# Patient Record
Sex: Male | Born: 1965 | Race: White | Hispanic: No | Marital: Single | Smoking: Never smoker
Health system: Southern US, Community
[De-identification: ages and names within clinical notes are randomized; demographics above are authoritative.]

## PROBLEM LIST (undated history)

## (undated) DIAGNOSIS — R110 Nausea: Secondary | ICD-10-CM

## (undated) DIAGNOSIS — M793 Panniculitis, unspecified: Secondary | ICD-10-CM

## (undated) DIAGNOSIS — M1990 Unspecified osteoarthritis, unspecified site: Secondary | ICD-10-CM

## (undated) DIAGNOSIS — N529 Male erectile dysfunction, unspecified: Secondary | ICD-10-CM

## (undated) DIAGNOSIS — G510 Bell's palsy: Secondary | ICD-10-CM

## (undated) DIAGNOSIS — R6883 Chills (without fever): Secondary | ICD-10-CM

## (undated) DIAGNOSIS — R519 Headache, unspecified: Secondary | ICD-10-CM

## (undated) DIAGNOSIS — N200 Calculus of kidney: Secondary | ICD-10-CM

## (undated) DIAGNOSIS — K219 Gastro-esophageal reflux disease without esophagitis: Secondary | ICD-10-CM

## (undated) DIAGNOSIS — R0789 Other chest pain: Secondary | ICD-10-CM

## (undated) DIAGNOSIS — G4700 Insomnia, unspecified: Secondary | ICD-10-CM

## (undated) DIAGNOSIS — G20A1 Parkinson's disease without dyskinesia, without mention of fluctuations: Secondary | ICD-10-CM

## (undated) DIAGNOSIS — F4310 Post-traumatic stress disorder, unspecified: Secondary | ICD-10-CM

## (undated) DIAGNOSIS — R748 Abnormal levels of other serum enzymes: Secondary | ICD-10-CM

## (undated) DIAGNOSIS — E119 Type 2 diabetes mellitus without complications: Secondary | ICD-10-CM

## (undated) DIAGNOSIS — R531 Weakness: Secondary | ICD-10-CM

## (undated) DIAGNOSIS — R42 Dizziness and giddiness: Secondary | ICD-10-CM

## (undated) DIAGNOSIS — I10 Essential (primary) hypertension: Secondary | ICD-10-CM

## (undated) DIAGNOSIS — E785 Hyperlipidemia, unspecified: Secondary | ICD-10-CM

## (undated) DIAGNOSIS — E781 Pure hyperglyceridemia: Secondary | ICD-10-CM

## (undated) DIAGNOSIS — F329 Major depressive disorder, single episode, unspecified: Secondary | ICD-10-CM

## (undated) DIAGNOSIS — F419 Anxiety disorder, unspecified: Secondary | ICD-10-CM

## (undated) DIAGNOSIS — R630 Anorexia: Secondary | ICD-10-CM

## (undated) DIAGNOSIS — R51 Headache: Secondary | ICD-10-CM

## (undated) HISTORY — DX: Major depressive disorder, single episode, unspecified: F32.9

## (undated) HISTORY — DX: Panniculitis, unspecified: M79.3

## (undated) HISTORY — DX: Other chest pain: R07.89

## (undated) HISTORY — DX: Dizziness and giddiness: R42

## (undated) HISTORY — DX: Chills (without fever): R68.83

## (undated) HISTORY — DX: Morbid (severe) obesity due to excess calories: E66.01

## (undated) HISTORY — DX: Male erectile dysfunction, unspecified: N52.9

## (undated) HISTORY — DX: Hyperlipidemia, unspecified: E78.5

## (undated) HISTORY — DX: Headache, unspecified: R51.9

## (undated) HISTORY — DX: Abnormal levels of other serum enzymes: R74.8

## (undated) HISTORY — DX: Anorexia: R63.0

## (undated) HISTORY — DX: Bell's palsy: G51.0

## (undated) HISTORY — DX: Unspecified osteoarthritis, unspecified site: M19.90

## (undated) HISTORY — DX: Nausea: R11.0

## (undated) HISTORY — DX: Calculus of kidney: N20.0

## (undated) HISTORY — DX: Pure hyperglyceridemia: E78.1

## (undated) HISTORY — DX: Insomnia, unspecified: G47.00

## (undated) HISTORY — PX: NO PAST SURGERIES: SHX2092

## (undated) HISTORY — DX: Weakness: R53.1

## (undated) HISTORY — DX: Gastro-esophageal reflux disease without esophagitis: K21.9

## (undated) HISTORY — DX: Headache: R51

---

## 1998-09-25 ENCOUNTER — Encounter: Payer: Self-pay | Admitting: Emergency Medicine

## 1998-09-25 ENCOUNTER — Inpatient Hospital Stay (HOSPITAL_COMMUNITY): Admission: EM | Admit: 1998-09-25 | Discharge: 1998-09-26 | Payer: Self-pay | Admitting: Emergency Medicine

## 1998-09-26 ENCOUNTER — Encounter: Payer: Self-pay | Admitting: Cardiovascular Disease

## 1999-03-26 ENCOUNTER — Emergency Department (HOSPITAL_COMMUNITY): Admission: EM | Admit: 1999-03-26 | Discharge: 1999-03-26 | Payer: Self-pay | Admitting: Emergency Medicine

## 1999-03-26 ENCOUNTER — Encounter: Payer: Self-pay | Admitting: Emergency Medicine

## 1999-04-10 ENCOUNTER — Ambulatory Visit (HOSPITAL_COMMUNITY): Admission: RE | Admit: 1999-04-10 | Discharge: 1999-04-10 | Payer: Self-pay | Admitting: Internal Medicine

## 1999-04-10 ENCOUNTER — Encounter (INDEPENDENT_AMBULATORY_CARE_PROVIDER_SITE_OTHER): Payer: Self-pay | Admitting: *Deleted

## 1999-04-10 ENCOUNTER — Encounter: Payer: Self-pay | Admitting: Internal Medicine

## 2000-02-26 ENCOUNTER — Emergency Department (HOSPITAL_COMMUNITY): Admission: EM | Admit: 2000-02-26 | Discharge: 2000-02-26 | Payer: Self-pay | Admitting: *Deleted

## 2001-06-06 ENCOUNTER — Encounter: Payer: Self-pay | Admitting: Emergency Medicine

## 2001-06-06 ENCOUNTER — Emergency Department (HOSPITAL_COMMUNITY): Admission: EM | Admit: 2001-06-06 | Discharge: 2001-06-06 | Payer: Self-pay | Admitting: Emergency Medicine

## 2001-06-07 ENCOUNTER — Emergency Department (HOSPITAL_COMMUNITY): Admission: EM | Admit: 2001-06-07 | Discharge: 2001-06-07 | Payer: Self-pay | Admitting: Emergency Medicine

## 2001-06-07 ENCOUNTER — Encounter: Payer: Self-pay | Admitting: Emergency Medicine

## 2002-01-22 ENCOUNTER — Encounter: Admission: RE | Admit: 2002-01-22 | Discharge: 2002-01-22 | Payer: Self-pay | Admitting: Internal Medicine

## 2002-01-22 ENCOUNTER — Encounter: Payer: Self-pay | Admitting: Internal Medicine

## 2002-02-21 ENCOUNTER — Encounter: Admission: RE | Admit: 2002-02-21 | Discharge: 2002-02-21 | Payer: Self-pay | Admitting: Urology

## 2002-02-21 ENCOUNTER — Encounter: Payer: Self-pay | Admitting: Urology

## 2002-02-22 ENCOUNTER — Ambulatory Visit (HOSPITAL_COMMUNITY): Admission: RE | Admit: 2002-02-22 | Discharge: 2002-02-22 | Payer: Self-pay | Admitting: Urology

## 2002-02-25 ENCOUNTER — Emergency Department (HOSPITAL_COMMUNITY): Admission: EM | Admit: 2002-02-25 | Discharge: 2002-02-25 | Payer: Self-pay | Admitting: Emergency Medicine

## 2003-12-10 ENCOUNTER — Emergency Department (HOSPITAL_COMMUNITY): Admission: EM | Admit: 2003-12-10 | Discharge: 2003-12-10 | Payer: Self-pay | Admitting: Emergency Medicine

## 2004-04-24 ENCOUNTER — Emergency Department (HOSPITAL_COMMUNITY): Admission: EM | Admit: 2004-04-24 | Discharge: 2004-04-24 | Payer: Self-pay | Admitting: Emergency Medicine

## 2006-07-26 ENCOUNTER — Emergency Department (HOSPITAL_COMMUNITY): Admission: EM | Admit: 2006-07-26 | Discharge: 2006-07-26 | Payer: Self-pay | Admitting: Emergency Medicine

## 2008-10-24 ENCOUNTER — Emergency Department (HOSPITAL_COMMUNITY): Admission: EM | Admit: 2008-10-24 | Discharge: 2008-10-24 | Payer: Self-pay | Admitting: Emergency Medicine

## 2009-09-07 DEATH — deceased

## 2010-08-17 LAB — BASIC METABOLIC PANEL
CO2: 25 mEq/L (ref 19–32)
Calcium: 9.1 mg/dL (ref 8.4–10.5)
GFR calc Af Amer: >60 mL/min (ref >60)
GFR calc non Af Amer: >60 mL/min (ref >60)
Sodium: 139 mEq/L (ref 135–145)

## 2010-08-17 LAB — CBC
Hemoglobin: 15.2 g/dL (ref 13.0–17.0)
MCHC: 33.3 g/dL (ref 30.0–36.0)
RBC: 5.16 MIL/uL (ref 4.22–5.81)
WBC: 8.1 10*3/uL (ref 4.0–10.5)

## 2010-08-17 LAB — DIFFERENTIAL
Lymphocytes Relative: 36 % (ref 12–46)
Lymphs Abs: 2.9 10*3/uL (ref 0.7–4.0)
Monocytes Absolute: 0.6 10*3/uL (ref 0.1–1.0)
Monocytes Relative: 7 % (ref 3–12)
Neutro Abs: 4.5 10*3/uL (ref 1.7–7.7)

## 2010-08-17 LAB — URINALYSIS, ROUTINE W REFLEX MICROSCOPIC
Hgb urine dipstick: NEGATIVE
Specific Gravity, Urine: 1.017 (ref 1.005–1.030)
Urobilinogen, UA: 1 mg/dL (ref 0.0–1.0)

## 2010-09-25 NOTE — Op Note (Signed)
NAMEJERIKO, [name] NO.:  [id number]   MEDICAL RECORD NO.:  [id number]          PATIENT TYPE:  EMS   LOCATION:  MAJO                          FACILITY:  [facility]   PHYSICIAN:  Anselm Pancoast. 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   PROCEDURE:  Drainage of perirectal abscess x2 and placement of seton. One is  posterior and the other one is anterior left at approximately the 1 or 2  o'clock position.   SURGEON:  Anselm Pancoast. Zachery Dakins, M.D.   ANESTHESIA:  General.   INDICATIONS:  [name] [name] is a 45 year old Caucasian male with a long  history of Crohn's disease.  Ten years ago he had resection of the terminal  ileum and part of his right colon.  Recently he has had recurrent problems  with perirectal infections and has been drained on several occasions.  He  was down at [facility] some time recently and they discussed using a rubber band  for management of this problem. The patient did not proceed with having it  done but approximately two weeks ago or maybe three weeks ago had a  perirectal infection.  He saw Reece Agar, his local doctor, who advised  him to see a Careers adviser.  He declined but they placed him on Augmentin and the  inflammation appeared to subside.  The patient is on chronic Flagyl and then  the area improved but only to recur and he presented to our office and will  see Dr. Luan Pulling.   PHYSICAL EXAMINATION:  On examination, he has an obvious fluctuant posterior  abscess to the right of midline where was a second abscess prompted the  bladder teardrop position.  He has had a chronic abscess anteriorly that is  not drainage or obviously fluctuant at this time.  She is  advised that this  be done under general anesthesia.  I talked with the patient and discussed  the planned procedure with him, and he is in agreement.   DESCRIPTION OF PROCEDURE:  He was given 3 g of Unasyn preoperatively,  positioned on the OR table under induction general anesthesia.  The patient  was placed in stirrups with the head down and first on rectal examination, I  could feel kind of a tight anus but probably pushed a little posterior.  I  prepped the perianal area with Betadine surgical solution and draped in a  sterile manner.  First there was a fluctuant area that was really about 3  inches or 4 inches from the actual anus.  I drained that and took a little  ellipse of skin in the most medial aspect opening into the pocket of the  infection, cultured for anaerobic and aerobic.  I then used the curet and  kind of scraped out the granulation tissue.  There was a little fistulous  tract kind of going anteriorly and at first with the probe I did not really  communicate it to the anus.  I then opened the second layer at about 9  o'clock in a similar and first actually tracks posterior to the abscess that  I had drained first.  Next, on trying to use an endoscope, his anus is tight  enough that really none of our diagnostic anoscopes would go into the anus.  You could use the little bivalve and just open it minimally and then with a  malleable probe I could identify an anterior fistula that is going over to  the area approximately 2 o'clock.  I then ellipsed out a little area of skin  where he had a previous I&D and the abscess cavity there communicates to the  skin.  I placed the malleable probe through the opening, and this was  anterior to the outside, and then I used a 2-0 silk on the malleable probe  to pull it in and then put the seton in place and tied it doubly with 2-0  silk.  I then directed my attention posterior again and this one was a much  deeper fistula in ano.  It  goes from nearly around the main posterior  sphincter mechanism and finally since I could not get an anoscope in to see  anything this high, cannot plan with a probe not having a Shepherd's hook  that I could try to approach it from the inside.  I was unable to slip it in  the area and then could bend the probe with my finger and pull the 2-0 silk  and then brought the seton in and tied it around the sphincter area.  None  of the setons were tied tightly, and I will recommend that we place the lead  of seton ___________ since this is a recurrent chronic problem and as far as  any type of permanent control of  probable closure of the fistulous tract, I  do not think it is going to be possible with his kind of necrosis history.  The little abscess cavities that I curetted out, I placed one inch Iodoform  gauze in the areas and then a little light dressing.  The patient wants to  be released after recovery.  I discussed with his wife that I think it would  fine.  I will put back on Augmentin or ampicillin for a few days in addition  to Flagyl, and I will see him at the office either Monday or Tuesday of next  week.  He is on chronic OxyContin or Vicodin for chronic anal pain, and we  will just continue that for the postoperative pain.      WJW/MEDQ  D:  03/25/2004  T:  03/26/2004  Job:  [id number]   cc:   Danise Edge, M.D.  [address]  [place]  [place] [address]  Fax: [phone]

## 2010-09-25 NOTE — Op Note (Signed)
NAME:  [name] [name], [name] [name] NO.:  [id number]   MEDICAL RECORD NO.:  [id number]                   PATIENT TYPE:  AMB   LOCATION:  DAY                                  FACILITY:  [facility]   PHYSICIAN:  Maretta Bees. Vonita Moss, M.D.             DATE OF BIRTH:  21-Dec-1965   DATE OF PROCEDURE:  02/22/2002  DATE OF DISCHARGE:                                 OPERATIVE REPORT   PREOPERATIVE DIAGNOSIS:  Right ureteral calculus.   POSTOPERATIVE DIAGNOSIS:  Right ureteral calculus.   PROCEDURE:  1. Cystoscopy.  2. Right retrograde pyelogram with interpretation.  3. Right ureteroscopy and extraction of stone with basket.  4. Insertion of right double-J catheter.   SURGEON:  Maretta Bees. Vonita Moss, M.D.   ANESTHESIA:  General.   INDICATIONS:  This 45 year old white male has had several days of severe  intermittent right flank pain due to a 5 mm stone in the ureter.  Actually,  he had some microhematuria in mid September, and a CT then showed a 5 mm  stone in the proximal right ureter, and apparently he was asymptomatic until  last week and at that point, he was sent to me yesterday for further  evaluation.  I could not see the stone for sure on KUB and got a repeat CT  scan that showed the stone had moved down in the more distal ureter.  Because of his severe intermittent symptoms, the patient required  intervention.  The stone was not a candidate for ESL, and he was advised  about ureteroscopy and risks of hemorrhage, perforation, etc.  He is brought  to the OR today for that procedure.   DESCRIPTION OF PROCEDURE:  The patient was brought to operating room and  placed in lithotomy position.  External genitalia were prepped and draped in  the usual fashion.  He was cystoscoped, and the bladder was normal.  I  passed a guidewire up the right ureter and under fluoroscopic control, there  was a hesitation putting up the guidewire at the sacroiliac region.  With  the guidewire  in place, I removed the cystoscope and performed a  ureteroscopy with the 6 French rigid scope and at the area corresponding to  where the guidewire hung-up, there was some spasm and tightness but slowly  and patiently allowing the ureter to relax, I was able to move up further  and visualize yellow-brownish stone.  I then used the [facility] stone basket,  and the stone was fairly impacted, but I was able to manipulate it free with  the basket and removed the stone.  I then reinserted the ureteroscope, and  that area again was tight where the stone was located, and I got above the  area and saw no residual stone fragments.  I injected contrast, and the  ureter was intact and just irregular where the stone was located.  I removed  the ureteroscope  and with the guidewire in place, I backloaded it into the  cystoscope and then performed retrograde pyelogram, demonstrated the right  pyelocaliceal system as needed measured ureteral length and selected a 6  French  28 cm double-J which was then inserted and had a full coil in the renal  pelvis and a full coil in the bladder with the string brought out per  urethra and taped to the penis.  The patient tolerated the procedure well  and was taken to the recovery room in good condition.                                               Maretta Bees. Vonita Moss, M.D.    LJP/MEDQ  D:  02/22/2002  T:  02/22/2002  Job:  [id number]

## 2014-01-16 ENCOUNTER — Ambulatory Visit (HOSPITAL_COMMUNITY): Payer: BC Managed Care – PPO | Attending: Internal Medicine | Admitting: Radiology

## 2014-01-16 ENCOUNTER — Other Ambulatory Visit (HOSPITAL_COMMUNITY): Payer: Self-pay | Admitting: Radiology

## 2014-01-16 VITALS — BP 136/89 | Ht 70.0 in | Wt 275.0 lb

## 2014-01-16 DIAGNOSIS — E119 Type 2 diabetes mellitus without complications: Secondary | ICD-10-CM | POA: Diagnosis not present

## 2014-01-16 DIAGNOSIS — R079 Chest pain, unspecified: Secondary | ICD-10-CM | POA: Insufficient documentation

## 2014-01-16 DIAGNOSIS — R5381 Other malaise: Secondary | ICD-10-CM | POA: Diagnosis not present

## 2014-01-16 DIAGNOSIS — R9439 Abnormal result of other cardiovascular function study: Secondary | ICD-10-CM | POA: Insufficient documentation

## 2014-01-16 DIAGNOSIS — R0602 Shortness of breath: Secondary | ICD-10-CM | POA: Insufficient documentation

## 2014-01-16 DIAGNOSIS — R5383 Other fatigue: Secondary | ICD-10-CM

## 2014-01-16 MED ORDER — TECHNETIUM TC 99M SESTAMIBI GENERIC - CARDIOLITE
33.0000 | Freq: Once | INTRAVENOUS | Status: AC | PRN
  Administered 2014-01-16: 33 via INTRAVENOUS

## 2014-01-16 NOTE — Progress Notes (Signed)
[facility] 3 NUCLEAR MED [address] [place], [place] [address] [phone]    Cardiology Nuclear Med Study  [name] [name] is a 48 y.o. male     MRN : [id number]     DOB: [birth year]  Procedure Date: 01/16/2014  Nuclear Med Background Indication for Stress Test:  Evaluation for Ischemia History:  n/a Cardiac Risk Factors: Hypertension and NIDDM  Symptoms:  Chest Pain and SOB   Nuclear Pre-Procedure Caffeine/Decaff Intake:  None NPO After: 6 pm   Lungs:  clear O2 Sat: 96% on room air. IV 0.9% NS with Angio Cath:  22g  IV Site: R Hand  IV Started by:  Bonnita Levan, RN  Chest Size (in):  56 Cup Size: n/a  Height:  (1.778 m)  Weight:  275 lb (124.739 kg)  BMI:  Body mass index is 39.46 kg/(m^2). Tech Comments:  No Diabetic Rx this am    Nuclear Med Study 1 or 2 day study: 2 day  Stress Test Type:  Stress  Reading MD: n/a  Order Authorizing Provider:  K.Husain MD  Resting Radionuclide: Technetium 23m Sestamibi  Resting Radionuclide Dose: 33.0 mCi on 01/18/14   Stress Radionuclide:  Technetium 60m Sestamibi  Stress Radionuclide Dose: 33.0 mCi on 01/16/14           Stress Protocol Rest HR: 72 Stress HR: 148  Rest BP: 136/89 Stress BP: 208/73  Exercise Time (min): 10:15 METS: 10.10   Predicted Max HR: 172 bpm % Max HR: 86.05 bpm Rate Pressure Product: 46962   Dose of Adenosine (mg):  n/a Dose of Lexiscan: n/a mg  Dose of Atropine (mg): n/a Dose of Dobutamine: n/a mcg/kg/min (at max HR)  Stress Test Technologist: Milana Na, EMT-P  Nuclear Technologist:  Doyne Keel, CNMT     Rest Procedure:  Myocardial perfusion imaging was performed at rest 45 minutes following the intravenous administration of Technetium 11m Sestamibi. Rest ECG: NSR - Normal EKG  Stress Procedure:  The patient exercised on the treadmill utilizing the Bruce Protocol for 10:15 minutes. The patient stopped due to fatigue and denied any chest pain.  Technetium 44m Sestamibi  was injected at peak exercise and myocardial perfusion imaging was performed after a brief delay. Stress ECG: No significant change from baseline ECG  QPS Raw Data Images:  Mild diaphragmatic attenuation.  Normal left ventricular size. Stress Images:  There is decreased uptake in the inferolateral wall. Rest Images:  There is decreased uptake in the inferolateral wall. Subtraction (SDS):  No evidence of ischemia. Transient Ischemic Dilatation (Normal <1.22):  0.74 Lung/Heart Ratio (Normal <0.45):  0.41  Quantitative Gated Spect Images QGS EDV:  87 ml QGS ESV:  35 ml  Impression Exercise Capacity:  Good exercise capacity. BP Response:  Hypertensive blood pressure response. Clinical Symptoms:  Fatigue ECG Impression:  No significant ST segment change suggestive of ischemia. Comparison with Prior Nuclear Study: No images to compare  Overall Impression:  Low risk stress nuclear study with a fixed defect in the inferolateral wall consistent with diaphragmatic attenuation.  No evidence of ischemia.  LV Ejection Fraction: 59%.  LV Wall Motion:  NL LV Function; NL Wall Motion  Signed: Armanda Magic, MD [facility]

## 2014-01-18 ENCOUNTER — Encounter (HOSPITAL_COMMUNITY): Payer: BC Managed Care – PPO | Attending: Cardiology

## 2014-01-18 DIAGNOSIS — R0989 Other specified symptoms and signs involving the circulatory and respiratory systems: Secondary | ICD-10-CM

## 2014-01-18 MED ORDER — TECHNETIUM TC 99M SESTAMIBI GENERIC - CARDIOLITE
33.0000 | Freq: Once | INTRAVENOUS | Status: AC | PRN
Start: 2014-01-18 — End: 2014-01-18
  Administered 2014-01-18: 33 via INTRAVENOUS

## 2014-01-21 NOTE — Addendum Note (Signed)
Addended by: Milinda Antis on: 01/21/2014 08:22 AM   Modules accepted: Orders

## 2016-10-13 ENCOUNTER — Emergency Department (HOSPITAL_COMMUNITY): Payer: Self-pay

## 2016-10-13 ENCOUNTER — Encounter (HOSPITAL_COMMUNITY): Payer: Self-pay | Admitting: Emergency Medicine

## 2016-10-13 ENCOUNTER — Emergency Department (HOSPITAL_COMMUNITY)
Admission: EM | Admit: 2016-10-13 | Discharge: 2016-10-13 | Disposition: A | Discharge status: Home / Self Care | Payer: Self-pay | Attending: Emergency Medicine | Admitting: Emergency Medicine

## 2016-10-13 DIAGNOSIS — Z7984 Long term (current) use of oral hypoglycemic drugs: Secondary | ICD-10-CM | POA: Insufficient documentation

## 2016-10-13 DIAGNOSIS — E119 Type 2 diabetes mellitus without complications: Secondary | ICD-10-CM | POA: Insufficient documentation

## 2016-10-13 DIAGNOSIS — R519 Headache, unspecified: Secondary | ICD-10-CM

## 2016-10-13 DIAGNOSIS — R51 Headache: Secondary | ICD-10-CM | POA: Insufficient documentation

## 2016-10-13 DIAGNOSIS — I10 Essential (primary) hypertension: Secondary | ICD-10-CM | POA: Insufficient documentation

## 2016-10-13 DIAGNOSIS — R404 Transient alteration of awareness: Secondary | ICD-10-CM

## 2016-10-13 DIAGNOSIS — R4182 Altered mental status, unspecified: Secondary | ICD-10-CM | POA: Insufficient documentation

## 2016-10-13 DIAGNOSIS — Z79899 Other long term (current) drug therapy: Secondary | ICD-10-CM | POA: Insufficient documentation

## 2016-10-13 HISTORY — DX: Anxiety disorder, unspecified: F41.9

## 2016-10-13 HISTORY — DX: Post-traumatic stress disorder, unspecified: F43.10

## 2016-10-13 HISTORY — DX: Type 2 diabetes mellitus without complications: E11.9

## 2016-10-13 HISTORY — DX: Essential (primary) hypertension: I10

## 2016-10-13 LAB — COMPREHENSIVE METABOLIC PANEL
ALBUMIN: 4.6 g/dL (ref 3.5–5.0)
ALK PHOS: 66 U/L (ref 38–126)
ALT: 45 U/L (ref 17–63)
AST: 31 U/L (ref 15–41)
Anion gap: 11 (ref 5–15)
BUN: 17 mg/dL (ref 6–20)
CALCIUM: 10.1 mg/dL (ref 8.9–10.3)
CO2: 23 mmol/L (ref 22–32)
CREATININE: 1.16 mg/dL (ref 0.61–1.24)
Chloride: 105 mmol/L (ref 101–111)
GFR calc non Af Amer: >60 mL/min (ref >60)
GLUCOSE: 110 mg/dL — AB (ref 65–99)
Potassium: 4.7 mmol/L (ref 3.5–5.1)
SODIUM: 139 mmol/L (ref 135–145)
Total Bilirubin: 1 mg/dL (ref 0.3–1.2)
Total Protein: 8.2 g/dL — ABNORMAL HIGH (ref 6.5–8.1)

## 2016-10-13 LAB — I-STAT CHEM 8, ED
BUN: 20 mg/dL (ref 6–20)
CHLORIDE: 105 mmol/L (ref 101–111)
CREATININE: 1.1 mg/dL (ref 0.61–1.24)
Calcium, Ion: 1.19 mmol/L (ref 1.15–1.40)
GLUCOSE: 109 mg/dL — AB (ref 65–99)
HCT: 51 % (ref 39.0–52.0)
Hemoglobin: 17.3 g/dL — ABNORMAL HIGH (ref 13.0–17.0)
POTASSIUM: 4.6 mmol/L (ref 3.5–5.1)
Sodium: 141 mmol/L (ref 135–145)
TCO2: 26 mmol/L (ref 0–100)

## 2016-10-13 LAB — DIFFERENTIAL
Basophils Absolute: 0 10*3/uL (ref 0.0–0.1)
Basophils Relative: 0 %
Eosinophils Absolute: 0.1 10*3/uL (ref 0.0–0.7)
Eosinophils Relative: 2 %
LYMPHS PCT: 35 %
Lymphs Abs: 2.5 10*3/uL (ref 0.7–4.0)
MONO ABS: 0.4 10*3/uL (ref 0.1–1.0)
Monocytes Relative: 5 %
NEUTROS ABS: 4.1 10*3/uL (ref 1.7–7.7)
Neutrophils Relative %: 58 %

## 2016-10-13 LAB — CBC
HCT: 50 % (ref 39.0–52.0)
Hemoglobin: 17.1 g/dL — ABNORMAL HIGH (ref 13.0–17.0)
MCH: 30.3 pg (ref 26.0–34.0)
MCHC: 34.2 g/dL (ref 30.0–36.0)
MCV: 88.7 fL (ref 78.0–100.0)
PLATELETS: 284 10*3/uL (ref 150–400)
RBC: 5.64 MIL/uL (ref 4.22–5.81)
RDW: 14.2 % (ref 11.5–15.5)
WBC: 7.1 10*3/uL (ref 4.0–10.5)

## 2016-10-13 LAB — URINALYSIS, ROUTINE W REFLEX MICROSCOPIC
Bilirubin Urine: NEGATIVE
Glucose, UA: NEGATIVE mg/dL
Hgb urine dipstick: NEGATIVE
KETONES UR: NEGATIVE mg/dL
LEUKOCYTES UA: NEGATIVE
NITRITE: NEGATIVE
PH: 5 (ref 5.0–8.0)
Protein, ur: NEGATIVE mg/dL
SPECIFIC GRAVITY, URINE: 1.019 (ref 1.005–1.030)

## 2016-10-13 LAB — RAPID URINE DRUG SCREEN, HOSP PERFORMED
Amphetamines: NOT DETECTED
Barbiturates: NOT DETECTED
Benzodiazepines: POSITIVE — AB
Cocaine: NOT DETECTED
OPIATES: NOT DETECTED
TETRAHYDROCANNABINOL: NOT DETECTED

## 2016-10-13 LAB — ETHANOL

## 2016-10-13 LAB — CBG MONITORING, ED: Glucose-Capillary: 124 mg/dL — ABNORMAL HIGH (ref 65–99)

## 2016-10-13 LAB — AMMONIA: Ammonia: 24 umol/L (ref 9–35)

## 2016-10-13 LAB — PROTIME-INR
INR: 1
PROTHROMBIN TIME: 13.2 s (ref 11.4–15.2)

## 2016-10-13 LAB — I-STAT TROPONIN, ED: Troponin i, poc: 0 ng/mL (ref 0.00–0.08)

## 2016-10-13 LAB — ACETAMINOPHEN LEVEL

## 2016-10-13 LAB — SALICYLATE LEVEL

## 2016-10-13 LAB — APTT: aPTT: 25 seconds (ref 24–36)

## 2016-10-13 NOTE — ED Provider Notes (Signed)
[facility]-EMERGENCY DEPT Provider Note   CSN: [id number] Arrival date & time: 10/13/16  1229     History   Chief Complaint Chief Complaint  Patient presents with  . Altered Mental Status    HPI [name] [name] is a 51 y.o. male.  HPI Patient reports that he has been working out and pursuing healthy lifestyle. He denies he's been having any medical problems. He does endorse that he's had headaches for about a week. Her generalized and pressure in nature. No associated dysfunction however. Notably, Monday night his brother told him that he ran around the apartment naked and was seen mildly. He reports he also urinated in the house. He has no recollection of this event. He reports that he has stayed home for the past 2 days because he is just felt as though he had a pressure in his head and he didn't want to go out anywhere. He denies he's had a fever, no syncope or injury that he is aware of. No nausea or vomiting. No chest pain or shortness of breath. He denies any drug use. He denies alcohol use. He reports he is working out doing both cardio and strength building but denies using any supplements or OTC products. Past Medical History:  Diagnosis Date  . Anxiety   . Diabetes mellitus without complication (HCC)   . Hypertension   . PTSD (post-traumatic stress disorder)     There are no active problems to display for this patient.   History reviewed. No pertinent surgical history.     Home Medications    Prior to Admission medications   Medication Sig Start Date End Date Taking? Authorizing Provider  ALPRAZolam Prudy Feeler) 0.5 MG tablet Take 0.5 mg by mouth 3 (three) times daily as needed for anxiety.   Yes [provider]  atorvastatin (LIPITOR) 20 MG tablet Take 20 mg by mouth daily.   Yes [provider]  buPROPion (WELLBUTRIN XL) 150 MG 24 hr tablet Take 150 mg by mouth every morning.   Yes [provider]  cholecalciferol (VITAMIN D) 1000 units tablet  Take 1,000 Units by mouth daily.   Yes [provider]  CINNAMON PO Take 1 capsule by mouth daily.   Yes [provider]  Cyanocobalamin (VITAMIN B-12 PO) Take 1 tablet by mouth daily.   Yes [provider]  DULoxetine (CYMBALTA) 30 MG capsule Take 60 mg by mouth 2 (two) times daily.   Yes [provider]  glimepiride (AMARYL) 4 MG tablet Take 4 mg by mouth 2 (two) times daily.   Yes [provider]  lisinopril (PRINIVIL,ZESTRIL) 10 MG tablet Take 10 mg by mouth daily.   Yes [provider]  metFORMIN (GLUCOPHAGE) 500 MG tablet Take 500 mg by mouth 2 (two) times daily.   Yes [provider]  Omega-3 1000 MG CAPS Take 1,000 mg by mouth daily.   Yes [provider]    Family History No family history on file.  Social History Social History  Substance Use Topics  . Smoking status: Never Smoker  . Smokeless tobacco: Not on file  . Alcohol use No     Allergies   Patient has no known allergies.   Review of Systems Review of Systems 10 Systems reviewed and are negative for acute change except as noted in the HPI.   Physical Exam Updated Vital Signs BP 119/75   Pulse 63   Temp 98.3 F (36.8 C) (Oral)   Resp 16  Ht 5\' 10"  (1.778 m)   Wt 112.5 kg (248 lb)   SpO2 95%   BMI 35.58 kg/m   Physical Exam  Constitutional: He is oriented to person, place, and time. He appears well-developed and well-nourished.  HENT:  Head: Normocephalic and atraumatic.  Right Ear: External ear normal.  Left Ear: External ear normal.  Nose: Nose normal.  Mouth/Throat: Oropharynx is clear and moist.  Eyes: Conjunctivae and EOM are normal. Pupils are equal, round, and reactive to light.  Neck: Neck supple.  Cardiovascular: Normal rate and regular rhythm.   No murmur heard. Pulmonary/Chest: Effort normal and breath sounds normal. No respiratory distress.  Abdominal: Soft. There is no tenderness.  Musculoskeletal: Normal  range of motion. He exhibits no edema or tenderness.  Neurological: He is alert and oriented to person, place, and time. No cranial nerve deficit or sensory deficit. He exhibits normal muscle tone. Coordination normal.  Normal finger-nose examination. Normal cognitive function. Normal speech.  Skin: Skin is warm and dry.  Psychiatric: He has a normal mood and affect.  Nursing note and vitals reviewed.    ED Treatments / Results  Labs (all labs ordered are listed, but only abnormal results are displayed) Labs Reviewed  CBC - Abnormal; Notable for the following:       Result Value   Hemoglobin 17.1 (*)    All other components within normal limits  COMPREHENSIVE METABOLIC PANEL - Abnormal; Notable for the following:    Glucose, Bld 110 (*)    Total Protein 8.2 (*)    All other components within normal limits  RAPID URINE DRUG SCREEN, HOSP PERFORMED - Abnormal; Notable for the following:    Benzodiazepines POSITIVE (*)    All other components within normal limits  ACETAMINOPHEN LEVEL - Abnormal; Notable for the following:    Acetaminophen (Tylenol), Serum <10 (*)    All other components within normal limits  CBG MONITORING, ED - Abnormal; Notable for the following:    Glucose-Capillary 124 (*)    All other components within normal limits  I-STAT CHEM 8, ED - Abnormal; Notable for the following:    Glucose, Bld 109 (*)    Hemoglobin 17.3 (*)    All other components within normal limits  PROTIME-INR  APTT  DIFFERENTIAL  URINALYSIS, ROUTINE W REFLEX MICROSCOPIC  ETHANOL  SALICYLATE LEVEL  AMMONIA  I-STAT TROPOININ, ED    EKG  EKG Interpretation None       Radiology Dg Chest 2 View  Result Date: 10/13/2016 CLINICAL DATA:  Left-sided chest pain EXAM: CHEST  2 VIEW COMPARISON:  08/24/2010 FINDINGS: Normal heart size and mediastinal contours. No acute infiltrate or edema. No effusion or pneumothorax. No acute osseous findings. IMPRESSION: Negative chest. Electronically  Signed   By: Marnee Spring M.D.   On: 10/13/2016 13:32   Ct Head Wo Contrast  Result Date: 10/13/2016 CLINICAL DATA:  Mental status changes. EXAM: CT HEAD WITHOUT CONTRAST TECHNIQUE: Contiguous axial images were obtained from the base of the skull through the vertex without intravenous contrast. COMPARISON:  10/24/2008 FINDINGS: Brain: There is no evidence for acute hemorrhage, hydrocephalus, mass lesion, or abnormal extra-axial fluid collection. No definite CT evidence for acute infarction. Vascular: No hyperdense vessel or unexpected calcification. Skull: No evidence for fracture. No worrisome lytic or sclerotic lesion. Sinuses/Orbits: Trace chronic polypoid mucosal disease noted maxillary sinuses. Visualized portions of the globes and intraorbital fat are unremarkable. Other: None. IMPRESSION: No acute intracranial abnormality. Electronically Signed   By: Minerva Areola  Molli PoseyMansell M.D.   On: 10/13/2016 13:45    Procedures Procedures (including critical care time)  Medications Ordered in ED Medications - No data to display   Initial Impression / Assessment and Plan / ED Course  I have reviewed the triage vital signs and the nursing notes.  Pertinent labs & imaging results that were available during my care of the patient were reviewed by me and considered in my medical decision making (see chart for details).      Final Clinical Impressions(s) / ED Diagnoses   Final diagnoses:  Transient alteration of awareness  Acute nonintractable headache, unspecified headache type   At this time, it is unclear what the etiology of the patient's isolated mental status change event was. He does not recall what happened but was told by his brother that he had been singing and walking around in the middle of the night. I discussed the possibility of panic attack with the patient having history of PTSD. He reports that he has never had anything similar happened him. More distant consideration is for a psychotic  break. Patient however shows no signs of confusion or psychosis at this time. His mental status is completely clear with normal cognitive function. He is not exhibiting any types of delusional thinking whatsoever. He also does not apparently have any drug use or abuse history that would suggest intoxication or withdrawal. He has had an proximally 4 week low-carb\carb free diet. He however has no metabolic derangements and is alert and appropriate. Currently there is no neurologic deficit. No signs of infectious illness. Plan will be to have the patient follow-up will both with his PCP and neurology. New Prescriptions New Prescriptions   No medications on file     Arby BarrettePfeiffer, Ihan Pat, MD 10/13/16 1450

## 2016-10-13 NOTE — ED Triage Notes (Signed)
Pt is AAOX4, knows the month and the president. Pt no neuro deficits at this time, states his mouth feels numb on both sides.

## 2016-10-13 NOTE — ED Triage Notes (Signed)
Pt is now complaining of chest pain.

## 2016-10-13 NOTE — ED Triage Notes (Signed)
Pt states "my brother told me on Monday that I ran around the house naked, my brother said that I peed on myself, I have no recollection of these events. I know my head feels kind of heavy, and my whole mouth feels kinda numb. My R eye is kinda droopy I think". I feel really foggy" Pt states he doesn't really remmeber what happened on Tuesday.

## 2016-10-20 ENCOUNTER — Encounter: Payer: Self-pay | Admitting: Neurology

## 2016-10-20 ENCOUNTER — Ambulatory Visit (INDEPENDENT_AMBULATORY_CARE_PROVIDER_SITE_OTHER): Payer: Self-pay | Admitting: Neurology

## 2016-10-20 VITALS — BP 110/70 | HR 93 | Ht 70.0 in | Wt 250.4 lb

## 2016-10-20 DIAGNOSIS — H8110 Benign paroxysmal vertigo, unspecified ear: Secondary | ICD-10-CM

## 2016-10-20 DIAGNOSIS — R4182 Altered mental status, unspecified: Secondary | ICD-10-CM

## 2016-10-20 DIAGNOSIS — R519 Headache, unspecified: Secondary | ICD-10-CM

## 2016-10-20 DIAGNOSIS — R51 Headache: Secondary | ICD-10-CM

## 2016-10-20 NOTE — Progress Notes (Signed)
NEUROLOGY CONSULTATION NOTE  [name] [name] MRN: [id number] DOB: [birth year]  Referring provider: Dr. Donette Larry Primary care provider: Dr. Donette Larry  Reason for consult:  Headache, confusion  HISTORY OF PRESENT ILLNESS: [name] [name] is a 51 year old right-handed male with hypertension, type 2 diabetes, anxiety and PTSD who presents for headache and confusion.  History supplemented by ED note.  About 4 weeks ago, he began experiencing a headache, described as a bilateral retro-orbital band-like 10/10 non-throbbing pressure pain.  They usually lasted 30 minutes (but once lasted 3 days) and occurred daily up until a week ago.  They are not associated with other symptoms such as nausea, photophobia or phonophobia.  In the early morning of 10/11/16, his brother woke up to find him running around the apartment naked, singing out loud with the TV volume up and also urinated on himself.  He has no recollection of this event.  He remembered going to sleep in his bed the previous night and waking up in the living room.  He did not take illicit drugs or alcohol.  He only took his prescribed medication (Xanax, Cymbalta, Wellbutrin).  His blood sugar was 66.  He presented to the ED at [facility] on 10/13/16 for further evaluation.  Labs, including CBC, CMP, UA, ammonia revealed no infection or metabolic abnormality.  ETOH was negative.  Urine drug screen was positive for benzodiazepine (he takes Xanax).  Otherwise, it was negative.  CT of head was personally reviewed and was unremarkable.  In addition to the headaches, which have since resolved, he also has experienced dizzy spells.  When he gets up from bed, he develops spinning sensation.  He needs to lay back down and it resolves up to a minute or so.    About 4 weeks ago, he started a ketogenic diet and started to work out four days a week.  He has lost 30 lbs over the past month.  He denies prior history of headache or seizures.  He has OSA for  which he uses a CPAP.  He reportedly had a sleep study sometime last year.  PAST MEDICAL HISTORY: Past Medical History:  Diagnosis Date  . Anxiety   . Diabetes mellitus without complication (HCC)   . Hypertension   . PTSD (post-traumatic stress disorder)     PAST SURGICAL HISTORY: History reviewed. No pertinent surgical history.  MEDICATIONS: Current Outpatient Prescriptions on File Prior to Visit  Medication Sig Dispense Refill  . ALPRAZolam (XANAX) 0.5 MG tablet Take 0.5 mg by mouth 3 (three) times daily as needed for anxiety.    Marland Kitchen atorvastatin (LIPITOR) 20 MG tablet Take 20 mg by mouth daily.    Marland Kitchen buPROPion (WELLBUTRIN XL) 150 MG 24 hr tablet Take 150 mg by mouth every morning.    . cholecalciferol (VITAMIN D) 1000 units tablet Take 1,000 Units by mouth daily.    Marland Kitchen CINNAMON PO Take 1 capsule by mouth daily.    . Cyanocobalamin (VITAMIN B-12 PO) Take 1 tablet by mouth daily.    Marland Kitchen glimepiride (AMARYL) 4 MG tablet Take 4 mg by mouth 2 (two) times daily.    Marland Kitchen lisinopril (PRINIVIL,ZESTRIL) 10 MG tablet Take 10 mg by mouth daily.    . metFORMIN (GLUCOPHAGE) 500 MG tablet Take 500 mg by mouth 2 (two) times daily.    . Omega-3 1000 MG CAPS Take 1,000 mg by mouth daily.    . DULoxetine (CYMBALTA) 30 MG capsule Take 60 mg by mouth 2 (two)  times daily.     No current facility-administered medications on file prior to visit.     ALLERGIES: No Known Allergies  FAMILY HISTORY: History reviewed. No pertinent family history.  SOCIAL HISTORY: Social History   Social History  . Marital status: Married    Spouse name: N/A  . Number of children: 0  . Years of education: 12   Occupational History  . Not on file.   Social History Main Topics  . Smoking status: Never Smoker  . Smokeless tobacco: Never Used  . Alcohol use No  . Drug use: No  . Sexual activity: Not on file   Other Topics Concern  . Not on file   Social History Narrative   Lives with brother in a one story home.   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     REVIEW OF SYSTEMS: Constitutional: No fevers, chills, or sweats, no generalized fatigue, change in appetite Eyes: No visual changes, double vision, eye pain Ear, nose and throat: No hearing loss, ear pain, nasal congestion, sore throat Cardiovascular: No chest pain, palpitations Respiratory:  No shortness of breath at rest or with exertion, wheezes GastrointestinaI: No nausea, vomiting, diarrhea, abdominal pain, fecal incontinence Genitourinary:  No dysuria, urinary retention or frequency Musculoskeletal:  No neck pain, back pain Integumentary: No rash, pruritus, skin lesions Neurological: as above Psychiatric: No depression, insomnia, anxiety Endocrine: No palpitations, fatigue, diaphoresis, mood swings, change in appetite, change in weight, increased thirst Hematologic/Lymphatic:  No purpura, petechiae. Allergic/Immunologic: no itchy/runny eyes, nasal congestion, recent allergic reactions, rashes  PHYSICAL EXAM: Vitals:   10/20/16 1451  BP: 110/70  Pulse: 93   General: No acute distress.  Patient appears well-groomed.  Head:  Normocephalic/atraumatic Eyes:  fundi examined but not visualized Neck: supple, no paraspinal tenderness, full range of motion Back: No paraspinal tenderness Heart: regular rate and rhythm Lungs: Clear to auscultation bilaterally. Vascular: No carotid bruits. Neurological Exam: Mental status: alert and oriented to person, place, and time, recent and remote memory intact, fund of knowledge intact, attention and concentration intact, speech fluent and not dysarthric, language intact. Cranial nerves: CN I: not tested CN II: pupils equal, round and reactive to light, visual fields intact CN III, IV, VI:  full range of motion, no nystagmus, no ptosis CN V: facial sensation intact CN VII: upper and lower face symmetric CN VIII: hearing intact CN IX, X: gag intact, uvula midline CN XI:  sternocleidomastoid and trapezius muscles intact CN XII: tongue midline Bulk & Tone: normal, no fasciculations. Motor:  5/5 throughout  Sensation: temperature and vibration sensation intact. Deep Tendon Reflexes:  2+ throughout, toes downgoing.  Finger to nose testing:  Without dysmetria.  Heel to shin:  Without dysmetria.  Gait:  Normal station and stride.  Able to turn and tandem walk. Romberg negative.  IMPRESSION: 1.  Confusion out of sleep. He reportedly wasn't hypoglycemic and was not taking illicit drugs.  Consider seizure versus a parasomnia. 2.  New onset headache, possibly related to decreased carbohydrates with new ketogenic diet.  It has since resolved. 3.  Benign paroxysmal positional vertigo  PLAN: 1.  We will check MRI of brain without contrast 2.  We will also check a sleep-deprived EEG.  If unremarkable, consider revisiting his sleep specialist to further evaluate for a parasomnia. 3.  If vertigo continues, consider vestibular rehabilitation.   4.  Further recommendations pending test results.  Thank you for allowing me to take part in the care of  this patient.  Shon Millet, DO  CC:  Georgann Housekeeper, MD

## 2016-10-20 NOTE — Patient Instructions (Signed)
Headaches may have been migraines triggered by the ketogenic diet.   The dizziness sounds like vertigo.   The episode of confusion may be a seizure or possibly a parasomnia  1.  We will check MRI of the brain without contrast 2. We will check a sleep-deprived EEG 3.  Further recommendations pending results.

## 2016-11-01 ENCOUNTER — Other Ambulatory Visit: Payer: Self-pay

## 2016-11-01 DIAGNOSIS — Z029 Encounter for administrative examinations, unspecified: Secondary | ICD-10-CM

## 2016-11-02 ENCOUNTER — Encounter: Payer: Self-pay | Admitting: Neurology

## 2016-11-05 ENCOUNTER — Other Ambulatory Visit: Payer: Self-pay

## 2017-08-29 ENCOUNTER — Other Ambulatory Visit: Payer: Self-pay | Admitting: Internal Medicine

## 2017-08-29 DIAGNOSIS — R110 Nausea: Secondary | ICD-10-CM

## 2017-08-30 ENCOUNTER — Telehealth: Payer: Self-pay

## 2017-08-30 NOTE — Telephone Encounter (Signed)
SENT REFERRAL TO SCHEDULING 

## 2017-09-07 ENCOUNTER — Other Ambulatory Visit: Payer: Self-pay

## 2017-09-08 ENCOUNTER — Other Ambulatory Visit: Payer: Self-pay

## 2017-09-16 ENCOUNTER — Other Ambulatory Visit: Payer: Managed Care, Other (non HMO)

## 2017-10-05 ENCOUNTER — Encounter: Payer: Self-pay | Admitting: Physician Assistant

## 2017-10-05 DIAGNOSIS — R079 Chest pain, unspecified: Secondary | ICD-10-CM | POA: Insufficient documentation

## 2017-10-05 NOTE — Progress Notes (Signed)
Cardiology Office Note    Date:  10/05/2017   ID:  [name] [name], DOB 02-20-1966, MRN [id number]  PCP:  Georgann Housekeeper, MD  Cardiologist: No primary care provider on file.  No chief complaint on file.   History of Present Illness:  [name] [name] is a 52 y.o. male with history of hypertension, diabetes who had a stress test here in 2015 for chest pain low risk nuclear stress test with fixed defect in the inferior lateral wall consistent with diaphragmatic attenuation, no ischemia EF 59%, normal wall motion.  Patient referred to [place] today by Dr. Georgann Housekeeper.  For chest pain.  Records reviewed from [facility] physician and EKG was said to be okay although not sent with records and they drew CK and troponin.  I do not have those results either.  There is a note in his past medical history of mildly elevated CPKs in the 300s with a rheumatology work-up that was negative in 2012.    Past Medical History:  Diagnosis Date  . Anxiety   . Chest heaviness   . Chills   . Depression, major   . Diabetes mellitus without complication (HCC)   . Dizzy   . Dyslipidemia   . ED (erectile dysfunction)   . Elevated CPK   . HA (headache)   . Hypertension   . Hypertriglyceridemia   . Insomnia   . Kidney stone   . Left-sided Bell's palsy   . Morbid obesity (HCC)   . Nausea   . No appetite   . Panniculitis    of right lower leg  . PTSD (post-traumatic stress disorder)   . Weakness     Past Surgical History:  Procedure Laterality Date  . NO PAST SURGERIES      Current Medications: No outpatient medications have been marked as taking for the 10/05/17 encounter (Appointment) with Dyann Kief, PA-C.     Allergies:   Patient has no known allergies.   Social History   Socioeconomic History  . Marital status: Married    Spouse name: Not on file  . Number of children: 0  . Years of education: 67  . Highest education level: Not on file  Occupational History  . Not on file    Social Needs  . Financial resource strain: Not on file  . Food insecurity:    Worry: Not on file    Inability: Not on file  . Transportation needs:    Medical: Not on file    Non-medical: Not on file  Tobacco Use  . Smoking status: Never Smoker  . Smokeless tobacco: Never Used  Substance and Sexual Activity  . Alcohol use: No  . Drug use: No  . Sexual activity: Not on file  Lifestyle  . Physical activity:    Days per week: Not on file    Minutes per session: Not on file  . Stress: Not on file  Relationships  . Social connections:    Talks on phone: Not on file    Gets together: Not on file    Attends religious service: Not on file    Active member of club or organization: Not on file    Attends meetings of clubs or organizations: Not on file    Relationship status: Not on file  Other Topics Concern  . Not on file  Social History Narrative   Lives with brother in a one story home.  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  Education: high  school.      Family History:  The patient's family history includes Cancer in his paternal grandfather; Depression in his brother; Diabetes in his father; Heart attack in his maternal grandfather; Heart disease in his mother; Heart failure in his paternal grandmother; Kidney failure in his father; Mental illness in his sister; Thyroid disease in his brother and sister.   ROS:   Please see the history of present illness.    ROS All other systems reviewed and are negative.   PHYSICAL EXAM:   VS:  There were no vitals taken for this visit.  Physical Exam  GEN: Well nourished, well developed, in no acute distress  HEENT: normal  Neck: no JVD, carotid bruits, or masses Cardiac:RRR; no murmurs, rubs, or gallops  Respiratory:  clear to auscultation bilaterally, normal work of breathing GI: soft, nontender, nondistended, + BS Ext: without cyanosis, clubbing, or edema, Good distal pulses bilaterally MS: no deformity or atrophy  Skin: warm and  dry, no rash Neuro:  Alert and Oriented x 3, Strength and sensation are intact Psych: euthymic mood, full affect  Wt Readings from Last 3 Encounters:  10/20/16 250 lb 6 oz (113.6 kg)  10/13/16 248 lb (112.5 kg)  01/16/14 275 lb (124.7 kg)      Studies/Labs Reviewed:   EKG:  EKG is ordered today.  The ekg ordered today demonstrates    Recent Labs: 10/13/2016: ALT 45; BUN 20; Creatinine, Ser 1.10; Hemoglobin 17.3; Platelets 284; Potassium 4.6; Sodium 141   Lipid Panel No results found for: CHOL, TRIG, HDL, CHOLHDL, VLDL, LDLCALC, LDLDIRECT  Additional studies/ records that were reviewed today include:  Nuclear stress test 2015   Impression Exercise Capacity:  Good exercise capacity. BP Response:  Hypertensive blood pressure response. Clinical Symptoms:  Fatigue ECG Impression:  No significant ST segment change suggestive of ischemia. Comparison with Prior Nuclear Study: No images to compare   Overall Impression:  Low risk stress nuclear study with a fixed defect in the inferolateral wall consistent with diaphragmatic attenuation.  No evidence of ischemia.   LV Ejection Fraction: 59%.  LV Wall Motion:  NL LV Function; NL Wall Motion   Signed: Armanda Magic, MD [facility]      ASSESSMENT:    1. Chest pain, unspecified type      PLAN:  In order of problems listed above:   Patient was a no show   Medication Adjustments/Labs and Tests Ordered: Current medicines are reviewed at length with the patient today.  Concerns regarding medicines are outlined above.  Medication changes, Labs and Tests ordered today are listed in the Patient Instructions below. There are no Patient Instructions on file for this visit.   Signed, Jacolyn Reedy, PA-C  10/05/2017 1:53 PM    [facility] [address] [place], [place], [place]  [address] Phone: [phone]; Fax: [phone]

## 2017-10-17 ENCOUNTER — Telehealth: Payer: Self-pay

## 2017-10-17 NOTE — Telephone Encounter (Signed)
Notes sent to scheduling from [facility]. 

## 2017-12-29 ENCOUNTER — Other Ambulatory Visit: Payer: Self-pay | Admitting: Internal Medicine

## 2017-12-29 ENCOUNTER — Ambulatory Visit
Admission: RE | Admit: 2017-12-29 | Discharge: 2017-12-29 | Disposition: A | Discharge status: Home / Self Care | Payer: Managed Care, Other (non HMO) | Source: Ambulatory Visit | Attending: Internal Medicine | Admitting: Internal Medicine

## 2017-12-29 DIAGNOSIS — M549 Dorsalgia, unspecified: Secondary | ICD-10-CM

## 2018-12-15 ENCOUNTER — Ambulatory Visit
Admission: RE | Admit: 2018-12-15 | Discharge: 2018-12-15 | Disposition: A | Discharge status: Home / Self Care | Payer: Managed Care, Other (non HMO) | Source: Ambulatory Visit | Attending: Internal Medicine | Admitting: Internal Medicine

## 2018-12-15 ENCOUNTER — Other Ambulatory Visit: Payer: Self-pay | Admitting: Internal Medicine

## 2018-12-15 DIAGNOSIS — R0602 Shortness of breath: Secondary | ICD-10-CM

## 2019-03-20 NOTE — Progress Notes (Signed)
CARDIOLOGY CONSULT NOTE       Patient ID: [name] [name] MRN: [id number] DOB/AGE: November 04, 1965 53 y.o.  Admit date: (Not on file) Referring Physician: Deforest Hoyles Primary Physician: Wenda Low, MD Primary Cardiologist: New Reason for Consultation: Dyspnea Cardiac Risk Factors   Active Problems:   * No active hospital problems. *   HPI:  53 y.o. history of HTN,, HLD and DM. Referred by Dr Deforest Hoyles for dyspnea He also had Anxiety and PTSD He has been seen by neuro in past for psychosis Has been on xanax, cymbalta and welbutrin Does not drink  Excessively or use drugs Do not see MRI being done CT June 2018 no acute disease Seen by Dr Claiborne Billings in 2015 For atypical chest pain and had normal myovue  EF 59%   Reviewed office not from Dr Deforest Hoyles 11/21/18 Tele visit Only mentions non specific dyspnea with activity in setting of morbid obesity associated with back pain He describes tightness in his chest all the time Been more sedentary with COVID as he works for [employer] with many areas not being open.   Lab review LDL 85 A1c 6.9 Cr 1.18 K 4.5 Hct 47.4 TSH 1.62   ROS All other systems reviewed and negative except as noted above  Past Medical History:  Diagnosis Date  . Anxiety   . Chest heaviness   . Chills   . Depression, major   . Diabetes mellitus without complication ([facility])   . Dizzy   . Dyslipidemia   . ED (erectile dysfunction)   . Elevated CPK   . HA (headache)   . Hypertension   . Hypertriglyceridemia   . Insomnia   . Kidney stone   . Left-sided Bell's palsy   . Morbid obesity ([place])   . Nausea   . No appetite   . Panniculitis    of right lower leg  . PTSD (post-traumatic stress disorder)   . Weakness     Family History  Problem Relation Age of Onset  . Thyroid disease Brother   . Depression Brother   . Heart disease Mother   . Kidney failure Father   . Diabetes Father   . Thyroid disease Sister   . Mental illness Sister   . Heart attack Maternal  Grandfather   . Heart failure Paternal Grandmother   . Cancer Paternal Grandfather        bone    Social History   Socioeconomic History  . Marital status: Married    Spouse name: Not on file  . Number of children: 0  . Years of education: 75  . Highest education level: Not on file  Occupational History  . Not on file  Social Needs  . Financial resource strain: Not on file  . Food insecurity    Worry: Not on file    Inability: Not on file  . Transportation needs    Medical: Not on file    Non-medical: Not on file  Tobacco Use  . Smoking status: Never Smoker  . Smokeless tobacco: Never Used  Substance and Sexual Activity  . Alcohol use: No  . Drug use: No  . Sexual activity: Not on file  Lifestyle  . Physical activity    Days per week: Not on file    Minutes per session: Not on file  . Stress: Not on file  Relationships  . Social Herbalist on phone: Not on file    Gets together: Not on file  Attends religious service: Not on file    Active member of club or organization: Not on file    Attends meetings of clubs or organizations: Not on file    Relationship status: Not on file  . Intimate partner violence    Fear of current or ex partner: Not on file    Emotionally abused: Not on file    Physically abused: Not on file    Forced sexual activity: Not on file  Other Topics Concern  . Not on file  Social History Narrative   Lives with brother in a one story home.  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     Past Surgical History:  Procedure Laterality Date  . NO PAST SURGERIES       Physical Exam: There were no vitals taken for this visit.   Affect appropriate Overweight white male  HEENT: normal Neck supple with no adenopathy JVP normal no bruits no thyromegaly Lungs clear with no wheezing and good diaphragmatic motion Heart:  S1/S2 no murmur, no rub, gallop or click PMI normal Abdomen: benighn, BS positve, no tenderness,  no AAA no bruit.  No HSM or HJR Distal pulses intact with no bruits No edema Neuro non-focal Skin warm and dry No muscular weakness   Labs:   Lab Results  Component Value Date   WBC 7.1 10/13/2016   HGB 17.3 (H) 10/13/2016   HCT 51.0 10/13/2016   MCV 88.7 10/13/2016   PLT 284 10/13/2016   Radiology: No results found.  EKG:  10/14/16 SR rate 77 normal 03/26/19 SR rate 90 normal    ASSESSMENT AND PLAN:   1. Dyspnea:  Seems functional f/u echo  2. HTN:  Well controlled.  Continue current medications and low sodium Dash type diet.   3. HLD:  On statin labs with primary  4. DM:  Discussed low carb diet.  Target hemoglobin A1c is 6.5 or less.  Continue current medications. 5. Neuro:  PTSD , MS changes f/u neuro currently on wellbutrin cymbalta and xanax 6. Chest Pain : atypical in diabetic f/u exercise myovue   Signed: Charlton Haws 03/20/2019, 7:16 PM

## 2019-03-26 ENCOUNTER — Other Ambulatory Visit: Payer: Self-pay

## 2019-03-26 ENCOUNTER — Encounter: Payer: Self-pay | Admitting: Cardiovascular Disease

## 2019-03-26 ENCOUNTER — Ambulatory Visit: Payer: Managed Care, Other (non HMO) | Admitting: Cardiovascular Disease

## 2019-03-26 VITALS — BP 120/88 | HR 90 | Ht 70.0 in | Wt 259.0 lb

## 2019-03-26 DIAGNOSIS — R079 Chest pain, unspecified: Secondary | ICD-10-CM

## 2019-03-26 DIAGNOSIS — R0600 Dyspnea, unspecified: Secondary | ICD-10-CM | POA: Diagnosis not present

## 2019-03-26 NOTE — Patient Instructions (Addendum)
Medication Instructions:   *If you need a refill on your cardiac medications before your next appointment, please call your pharmacy*  Lab Work:  If you have labs (blood work) drawn today and your tests are completely normal, you will receive your results only by: Marland Kitchen MyChart Message (if you have MyChart) OR . A paper copy in the mail If you have any lab test that is abnormal or we need to change your treatment, we will call you to review the results.  Testing/Procedures: Your physician has requested that you have en exercise stress myoview. For further information please visit [email or web address]. Please follow instruction sheet, as given.  Your physician has requested that you have an echocardiogram. Echocardiography is a painless test that uses sound waves to create images of your heart. It provides your doctor with information about the size and shape of your heart and how well your heart's chambers and valves are working. This procedure takes approximately one hour. There are no restrictions for this procedure.  Follow-Up: At [facility], you and your health needs are our priority.  As part of our continuing mission to provide you with exceptional heart care, we have created designated Provider Care Teams.  These Care Teams include your primary Cardiologist (physician) and Advanced Practice Providers (APPs -  Physician Assistants and Nurse Practitioners) who all work together to provide you with the care you need, when you need it.  Your next appointment:   As needed  The format for your next appointment:   In Person  Provider:   You may see Dr. Johnsie Cancel or one of the following Advanced Practice Providers on your designated Care Team:    Truitt Merle, NP  Cecilie Kicks, NP  Kathyrn Drown, NP  Your Pre-procedure COVID-19 Testing will be done on (someone will call you to schedule) at [facility] at [address], [place], [place] [address]. Once you  arrive at the testing site, stay in the right hand lane, go under the building overhang not the tent. If you are tested under the tent your results may not be back before your procedure. Please be on time for your appointment.  After your swab you will be given a mask to wear and instructed to go home and quarantine/no visitors until after your procedure. If you test positive you will be notified and your procedure will be cancelled.

## 2019-04-03 ENCOUNTER — Encounter (HOSPITAL_COMMUNITY): Payer: Self-pay | Admitting: Cardiovascular Disease

## 2019-04-10 ENCOUNTER — Other Ambulatory Visit (HOSPITAL_COMMUNITY): Payer: Managed Care, Other (non HMO)

## 2019-04-10 ENCOUNTER — Encounter (HOSPITAL_COMMUNITY): Payer: Self-pay | Admitting: Cardiovascular Disease

## 2019-04-16 ENCOUNTER — Telehealth (HOSPITAL_COMMUNITY): Payer: Self-pay

## 2019-04-16 NOTE — Telephone Encounter (Signed)
New message    Just an FYI. We have made several attempts to contact this patient including sending a letter to schedule or reschedule their echocardiogram & Myocardial Perfusion Imaging.  We will be removing the patient from the echo WQ.   12.7.20 @ 3:42pm lm on home  vm - Challen Spainhour 12.4.20 @ 8:54am lm on home vm - Wanya Bangura 11.24.20 @ 9:41am both # are the same - lm on home vm - Zuriah Bordas 11.24.20 mail reminder letter Antonius Hartlage 11.20.20 @ 3:16pm lm on home vm - Cantrell Martus

## 2019-05-30 ENCOUNTER — Other Ambulatory Visit: Payer: Self-pay | Admitting: Internal Medicine

## 2019-05-30 DIAGNOSIS — R109 Unspecified abdominal pain: Secondary | ICD-10-CM

## 2019-05-31 ENCOUNTER — Ambulatory Visit
Admission: RE | Admit: 2019-05-31 | Discharge: 2019-05-31 | Disposition: A | Discharge status: Home / Self Care | Payer: Managed Care, Other (non HMO) | Source: Ambulatory Visit | Attending: Internal Medicine | Admitting: Internal Medicine

## 2019-05-31 DIAGNOSIS — R109 Unspecified abdominal pain: Secondary | ICD-10-CM

## 2019-06-27 ENCOUNTER — Other Ambulatory Visit: Payer: Self-pay | Admitting: Internal Medicine

## 2019-06-27 ENCOUNTER — Ambulatory Visit
Admission: RE | Admit: 2019-06-27 | Discharge: 2019-06-27 | Disposition: A | Discharge status: Home / Self Care | Payer: Managed Care, Other (non HMO) | Source: Ambulatory Visit | Attending: Internal Medicine | Admitting: Internal Medicine

## 2019-06-27 DIAGNOSIS — M25551 Pain in right hip: Secondary | ICD-10-CM

## 2019-09-03 ENCOUNTER — Ambulatory Visit
Admission: RE | Admit: 2019-09-03 | Discharge: 2019-09-03 | Disposition: A | Discharge status: Home / Self Care | Payer: Managed Care, Other (non HMO) | Source: Ambulatory Visit | Attending: Internal Medicine | Admitting: Internal Medicine

## 2019-09-03 ENCOUNTER — Other Ambulatory Visit: Payer: Self-pay

## 2019-09-03 ENCOUNTER — Other Ambulatory Visit: Payer: Self-pay | Admitting: Internal Medicine

## 2019-09-03 DIAGNOSIS — R109 Unspecified abdominal pain: Secondary | ICD-10-CM

## 2019-10-17 ENCOUNTER — Other Ambulatory Visit: Payer: Self-pay

## 2019-10-17 ENCOUNTER — Ambulatory Visit (HOSPITAL_COMMUNITY): Payer: Self-pay | Admitting: Clinical

## 2019-10-24 ENCOUNTER — Other Ambulatory Visit: Payer: Self-pay

## 2019-10-24 ENCOUNTER — Telehealth (HOSPITAL_COMMUNITY): Payer: 59 | Admitting: Psychiatric/Mental Health

## 2019-10-24 DIAGNOSIS — F329 Major depressive disorder, single episode, unspecified: Secondary | ICD-10-CM | POA: Insufficient documentation

## 2019-10-24 DIAGNOSIS — F4310 Post-traumatic stress disorder, unspecified: Secondary | ICD-10-CM | POA: Insufficient documentation

## 2019-10-31 DIAGNOSIS — M25561 Pain in right knee: Secondary | ICD-10-CM | POA: Insufficient documentation

## 2019-10-31 DIAGNOSIS — M1711 Unilateral primary osteoarthritis, right knee: Secondary | ICD-10-CM | POA: Insufficient documentation

## 2019-11-13 ENCOUNTER — Other Ambulatory Visit: Payer: Self-pay

## 2019-11-13 ENCOUNTER — Telehealth (HOSPITAL_COMMUNITY): Payer: 59 | Admitting: Psychiatric/Mental Health

## 2019-11-16 ENCOUNTER — Ambulatory Visit (HOSPITAL_COMMUNITY)
Admission: EM | Admit: 2019-11-16 | Discharge: 2019-11-16 | Disposition: A | Discharge status: Home / Self Care | Payer: 59

## 2019-11-16 ENCOUNTER — Other Ambulatory Visit: Payer: Self-pay

## 2019-11-16 ENCOUNTER — Telehealth (HOSPITAL_COMMUNITY): Payer: 59

## 2019-11-19 ENCOUNTER — Telehealth (HOSPITAL_COMMUNITY): Payer: Self-pay | Admitting: *Deleted

## 2019-11-19 NOTE — Telephone Encounter (Signed)
Call from patient stating he was "stood up" for his appt on fri the 9th and is out of his medications. Discussed with Dr Evelene Croon and she suggested he be put on the schedule for Wed the 14th @ 130 with Toy Cookey NP. He was given this information. He would like a virtual appt. Information given to patient and he expressed his understanding.

## 2019-11-21 ENCOUNTER — Other Ambulatory Visit: Payer: Self-pay

## 2019-11-21 ENCOUNTER — Encounter (HOSPITAL_COMMUNITY): Payer: Self-pay | Admitting: Psychiatry

## 2019-11-21 ENCOUNTER — Telehealth (INDEPENDENT_AMBULATORY_CARE_PROVIDER_SITE_OTHER): Payer: 59 | Admitting: Psychiatry

## 2019-11-21 DIAGNOSIS — F331 Major depressive disorder, recurrent, moderate: Secondary | ICD-10-CM | POA: Diagnosis not present

## 2019-11-21 MED ORDER — ARIPIPRAZOLE 5 MG PO TABS: 5.0000 mg | ORAL_TABLET | Freq: Every day | ORAL | 2 refills | Status: DC

## 2019-11-21 MED ORDER — DULOXETINE HCL 60 MG PO CPEP: 60.0000 mg | ORAL_CAPSULE | Freq: Two times a day (BID) | ORAL | 2 refills | Status: DC

## 2019-11-21 MED ORDER — BUPROPION HCL ER (XL) 300 MG PO TB24: 300.0000 mg | ORAL_TABLET | ORAL | 2 refills | Status: DC

## 2019-11-21 MED ORDER — OXCARBAZEPINE 300 MG PO TABS: 300.0000 mg | ORAL_TABLET | Freq: Two times a day (BID) | ORAL | 2 refills | Status: DC

## 2019-11-21 NOTE — Progress Notes (Signed)
Psychiatric Initial Adult Assessment  Virtual Visit via Video Note  I connected with [name] [name] on 11/21/19 at  1:30 PM EDT by a video enabled telemedicine application and verified that I am speaking with the correct person using two identifiers.  Location: Patient: Home Provider: Clinic   I discussed the limitations of evaluation and management by telemedicine and the availability of in person appointments. The patient expressed understanding and agreed to proceed.  I provided 45 minutes of non-face-to-face time during this encounter.    Patient Identification: [name] [name] MRN:  [id number] Date of Evaluation:  11/21/2019 Referral Source: Vesta Mixer Chief Complaint:  "Since my trileptal was increased I have been trembling" Visit Diagnosis:    ICD-10-CM   1. Moderate episode of recurrent major depressive disorder (HCC)  F33.1 buPROPion (WELLBUTRIN XL) 300 MG 24 hr tablet    DULoxetine (CYMBALTA) 60 MG capsule    ARIPiprazole (ABILIFY) 5 MG tablet    Oxcarbazepine (TRILEPTAL) 300 MG tablet    History of Present Illness: 54 year old male seen today for initial psychiatric evaluation.  He was referred to outpatient psychiatry by [facility] for medication management.  He has a psychiatric history of depression and PTSD.  Patient was unaware of all of his medications.  Provider called Harlow Asa to verify medications. He is currently being managed on Wellbutrin XL 300 mg daily, Cymbalta 60 mg twice daily, Abilify 5 mg daily, Trileptal 450 mg twice daily, and Xanax 0.5 mg 3 times daily (percribed by PCP).  He notes that 3 months ago his Trileptal dose was increased and reports that since that time he has had slight trembling however notes that his medications are effective in managing his depression.  Patient endorsed occasional depressive symptoms such as fatigue, concentrating, anxiety, and weight loss.  He notes that his most current depressed state is due to the loss of his dog who he reports  that he had for several years.  He notes when he is anxious he checks his work bag and room.  He denies SI/HI/VHAH and paranoia.  Patient agreeable to reducing Trileptal 450 twice daily to Trileptal 300 mg twice daily.  He will continue all other medications as prescribed.  No other concerns noted at this time.   Associated Signs/Symptoms: Depression Symptoms:  depressed mood, fatigue, difficulty concentrating, anxiety, weight loss, (Hypo) Manic Symptoms:  Distractibility, Elevated Mood, Anxiety Symptoms:  Excessive Worry, Obsessive Compulsive Symptoms:   Checking,, Psychotic Symptoms:  Denies PTSD Symptoms: NA  Past Psychiatric History: Depression, anxiety, PTSD  Previous Psychotropic Medications: No   Substance Abuse History in the last 12 months:  No.  Consequences of Substance Abuse: NA  Past Medical History:  Past Medical History:  Diagnosis Date  . Anxiety   . Chest heaviness   . Chills   . Depression, major   . Diabetes mellitus without complication (HCC)   . Dizzy   . Dyslipidemia   . ED (erectile dysfunction)   . Elevated CPK   . HA (headache)   . Hypertension   . Hypertriglyceridemia   . Insomnia   . Kidney stone   . Left-sided Bell's palsy   . Morbid obesity (HCC)   . Nausea   . No appetite   . Panniculitis    of right lower leg  . PTSD (post-traumatic stress disorder)   . Weakness     Past Surgical History:  Procedure Laterality Date  . NO PAST SURGERIES      Family Psychiatric History: Sister schizophrenia and  bipolar disord  Family History:  Family History  Problem Relation Age of Onset  . Thyroid disease Brother   . Depression Brother   . Heart disease Mother   . Kidney failure Father   . Diabetes Father   . Thyroid disease Sister   . Mental illness Sister   . Heart attack Maternal Grandfather   . Heart failure Paternal Grandmother   . Cancer Paternal Grandfather        bone    Social History:   Social History    Socioeconomic History  . Marital status: Married    Spouse name: Not on file  . Number of children: 0  . Years of education: 30  . Highest education level: Not on file  Occupational History  . Not on file  Tobacco Use  . Smoking status: Never Smoker  . Smokeless tobacco: Never Used  Vaping Use  . Vaping Use: Never used  Substance and Sexual Activity  . Alcohol use: No  . Drug use: No  . Sexual activity: Not on file  Other Topics Concern  . Not on file  Social History Narrative   Lives with brother in a one story home.  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    Social Determinants of Health   Financial Resource Strain:   . Difficulty of Paying Living Expenses:   Food Insecurity:   . Worried About Programme researcher, broadcasting/film/video in the Last Year:   . Barista in the Last Year:   Transportation Needs:   . Freight forwarder (Medical):   Marland Kitchen Lack of Transportation (Non-Medical):   Physical Activity:   . Days of Exercise per Week:   . Minutes of Exercise per Session:   Stress:   . Feeling of Stress :   Social Connections:   . Frequency of Communication with Friends and Family:   . Frequency of Social Gatherings with Friends and Family:   . Attends Religious Services:   . Active Member of Clubs or Organizations:   . Attends Banker Meetings:   Marland Kitchen Marital Status:     Additional Social History: Patient is single he has no children.  He currently works at [employer].  He denies alcohol, tobacco, or illicit drug use.  Allergies:  No Known Allergies  Metabolic Disorder Labs: No results found for: HGBA1C, MPG No results found for: PROLACTIN No results found for: CHOL, TRIG, HDL, CHOLHDL, VLDL, LDLCALC No results found for: TSH  Therapeutic Level Labs: No results found for: LITHIUM No results found for: CBMZ No results found for: VALPROATE  Current Medications: Current Outpatient Medications  Medication Sig  Dispense Refill  . ALPRAZolam (XANAX) 0.5 MG tablet Take 0.5 mg by mouth 3 (three) times daily as needed for anxiety.    . ARIPiprazole (ABILIFY) 5 MG tablet Take 1 tablet (5 mg total) by mouth daily. 30 tablet 2  . aspirin EC 81 MG tablet Take 81 mg by mouth daily.    Marland Kitchen atorvastatin (LIPITOR) 20 MG tablet Take 20 mg by mouth daily.    Marland Kitchen buPROPion (WELLBUTRIN XL) 300 MG 24 hr tablet Take 1 tablet (300 mg total) by mouth every morning. 30 tablet 2  . cholecalciferol (VITAMIN D) 1000 units tablet Take 1,000 Units by mouth daily.    Marland Kitchen CINNAMON PO Take 1 capsule by mouth daily.    . Cyanocobalamin (VITAMIN B-12 PO) Take 1 tablet by mouth daily.    Marland Kitchen  DULoxetine (CYMBALTA) 30 MG capsule Take 60 mg by mouth 2 (two) times daily.    . DULoxetine (CYMBALTA) 60 MG capsule Take 1 capsule (60 mg total) by mouth 2 (two) times daily. 60 capsule 2  . glimepiride (AMARYL) 4 MG tablet Take 4 mg by mouth 2 (two) times daily.    Marland Kitchen lisinopril (PRINIVIL,ZESTRIL) 10 MG tablet Take 10 mg by mouth daily.    . metFORMIN (GLUCOPHAGE) 500 MG tablet Take 500 mg by mouth 2 (two) times daily.    . Multiple Vitamin (MULTIVITAMIN) tablet Take 1 tablet by mouth daily.    . Omega-3 1000 MG CAPS Take 1,000 mg by mouth daily.    Marland Kitchen omeprazole (PRILOSEC) 20 MG capsule Take 20 mg by mouth daily.    . Oxcarbazepine (TRILEPTAL) 300 MG tablet Take 1 tablet (300 mg total) by mouth 2 (two) times daily. 60 tablet 2  . promethazine (PHENERGAN) 25 MG tablet Take 25 mg by mouth every 6 (six) hours as needed for nausea or vomiting.    . sildenafil (REVATIO) 20 MG tablet Take 20 mg by mouth 3 (three) times daily.     No current facility-administered medications for this visit.    Musculoskeletal: Strength & Muscle Tone: Unable to assess due to telehealth visit Gait & Station: Unable to assess due to telehealth visit Patient leans: N/A  Psychiatric Specialty Exam: Review of Systems  There were no vitals taken for this visit.There is no  height or weight on file to calculate BMI.  General Appearance: Well Groomed  Eye Contact:  Good  Speech:  Clear and Coherent and Normal Rate  Volume:  Normal  Mood:  Euthymic  Affect:  Congruent  Thought Process:  Coherent, Goal Directed and Linear  Orientation:  Full (Time, Place, and Person)  Thought Content:  WDL and Logical  Suicidal Thoughts:  No  Homicidal Thoughts:  No  Memory:  Immediate;   Good Recent;   Good Remote;   Good  Judgement:  Good  Insight:  Good  Psychomotor Activity:  Normal  Concentration:  Concentration: Good and Attention Span: Good  Recall:  Good  Fund of Knowledge:Good  Language: Good  Akathisia:  No  Handed:  Right  AIMS (if indicated):  Not done  Assets:  Communication Skills Desire for Improvement Financial Resources/Insurance Housing  ADL's:  Intact  Cognition: WNL  Sleep:  Good   Screenings:   Assessment and Plan: Patient endorses occasional depression.  He also reports that since the increase of his Trileptal he has had a slight tremor.  He is agreeable to reducing Trileptal 450 mg twice daily to 300 mg twice daily.  He will continue all other medications as prescribed.  1. Moderate episode of recurrent major depressive disorder (HCC)  Continue- buPROPion (WELLBUTRIN XL) 300 MG 24 hr tablet; Take 1 tablet (300 mg total) by mouth every morning.  Dispense: 30 tablet; Refill: 2 Continue- DULoxetine (CYMBALTA) 60 MG capsule; Take 1 capsule (60 mg total) by mouth 2 (two) times daily.  Dispense: 60 capsule; Refill: 2 Continue- ARIPiprazole (ABILIFY) 5 MG tablet; Take 1 tablet (5 mg total) by mouth daily.  Dispense: 30 tablet; Refill: 2 Decreased- Oxcarbazepine (TRILEPTAL) 300 MG tablet; Take 1 tablet (300 mg total) by mouth 2 (two) times daily.  Dispense: 60 tablet; Refill: 2  Follow-up in 2 months Follow-up with therapy   Shanna Cisco, NP 7/14/20215:46 PM

## 2019-11-30 ENCOUNTER — Telehealth (HOSPITAL_COMMUNITY): Payer: 59

## 2019-12-18 ENCOUNTER — Telehealth (HOSPITAL_COMMUNITY): Payer: 59

## 2019-12-26 ENCOUNTER — Ambulatory Visit (HOSPITAL_COMMUNITY)
Admission: EM | Admit: 2019-12-26 | Discharge: 2019-12-26 | Disposition: A | Discharge status: Home / Self Care | Payer: Self-pay | Attending: Family Medicine | Admitting: Family Medicine

## 2019-12-26 ENCOUNTER — Other Ambulatory Visit: Payer: Self-pay

## 2019-12-26 ENCOUNTER — Emergency Department (HOSPITAL_COMMUNITY)
Admission: EM | Admit: 2019-12-26 | Discharge: 2019-12-27 | Disposition: A | Discharge status: Home / Self Care | Payer: Managed Care, Other (non HMO) | Attending: Emergency Medicine | Admitting: Emergency Medicine

## 2019-12-26 ENCOUNTER — Emergency Department (HOSPITAL_COMMUNITY): Payer: Managed Care, Other (non HMO)

## 2019-12-26 ENCOUNTER — Encounter (HOSPITAL_COMMUNITY): Payer: Self-pay

## 2019-12-26 DIAGNOSIS — Z79899 Other long term (current) drug therapy: Secondary | ICD-10-CM | POA: Insufficient documentation

## 2019-12-26 DIAGNOSIS — R110 Nausea: Secondary | ICD-10-CM | POA: Insufficient documentation

## 2019-12-26 DIAGNOSIS — E119 Type 2 diabetes mellitus without complications: Secondary | ICD-10-CM | POA: Insufficient documentation

## 2019-12-26 DIAGNOSIS — R0602 Shortness of breath: Secondary | ICD-10-CM

## 2019-12-26 DIAGNOSIS — R079 Chest pain, unspecified: Secondary | ICD-10-CM | POA: Diagnosis not present

## 2019-12-26 DIAGNOSIS — R197 Diarrhea, unspecified: Secondary | ICD-10-CM | POA: Diagnosis not present

## 2019-12-26 DIAGNOSIS — Z7984 Long term (current) use of oral hypoglycemic drugs: Secondary | ICD-10-CM | POA: Diagnosis not present

## 2019-12-26 DIAGNOSIS — I10 Essential (primary) hypertension: Secondary | ICD-10-CM | POA: Insufficient documentation

## 2019-12-26 LAB — TROPONIN I (HIGH SENSITIVITY)
Troponin I (High Sensitivity): 5 ng/L (ref <18)
Troponin I (High Sensitivity): 6 ng/L (ref <18)

## 2019-12-26 LAB — CBC
HCT: 49.4 % (ref 39.0–52.0)
Hemoglobin: 16.8 g/dL (ref 13.0–17.0)
MCH: 29.8 pg (ref 26.0–34.0)
MCHC: 34 g/dL (ref 30.0–36.0)
MCV: 87.6 fL (ref 80.0–100.0)
Platelets: 286 10*3/uL (ref 150–400)
RBC: 5.64 MIL/uL (ref 4.22–5.81)
RDW: 13.5 % (ref 11.5–15.5)
WBC: 9 10*3/uL (ref 4.0–10.5)
nRBC: 0 % (ref 0.0–0.2)

## 2019-12-26 LAB — BASIC METABOLIC PANEL
Anion gap: 12 (ref 5–15)
BUN: 19 mg/dL (ref 6–20)
CO2: 25 mmol/L (ref 22–32)
Calcium: 9.9 mg/dL (ref 8.9–10.3)
Chloride: 99 mmol/L (ref 98–111)
Creatinine, Ser: 1.11 mg/dL (ref 0.61–1.24)
GFR calc Af Amer: >60 mL/min (ref >60)
GFR calc non Af Amer: >60 mL/min (ref >60)
Glucose, Bld: 236 mg/dL — ABNORMAL HIGH (ref 70–99)
Potassium: 3.9 mmol/L (ref 3.5–5.1)
Sodium: 136 mmol/L (ref 135–145)

## 2019-12-26 MED ORDER — NITROGLYCERIN 0.4 MG SL SUBL
SUBLINGUAL_TABLET | SUBLINGUAL | Status: AC
  Filled 2019-12-26: qty 1

## 2019-12-26 MED ORDER — ASPIRIN 81 MG PO CHEW
CHEWABLE_TABLET | ORAL | Status: AC
  Filled 2019-12-26: qty 4

## 2019-12-26 MED ORDER — NITROGLYCERIN 0.4 MG SL SUBL
0.4000 mg | SUBLINGUAL_TABLET | SUBLINGUAL | Status: DC | PRN
  Administered 2019-12-26: 0.4 mg via SUBLINGUAL

## 2019-12-26 MED ORDER — SODIUM CHLORIDE 0.9 % IV SOLN: Freq: Once | INTRAVENOUS | Status: AC

## 2019-12-26 MED ORDER — ASPIRIN 81 MG PO CHEW
324.0000 mg | CHEWABLE_TABLET | Freq: Once | ORAL | Status: AC
  Administered 2019-12-26: 324 mg via ORAL

## 2019-12-26 NOTE — ED Provider Notes (Signed)
[facility]   [id number] 12/26/19 Arrival Time: 1105  ASSESSMENT & PLAN:  1. Chest pain, unspecified type   2. Shortness of breath     ECG: Performed today and interpreted by me: sinus tachycardia; no STEMI.  IV access obtained. Placed on O2.  Meds ordered this encounter  Medications  . 0.9 %  sodium chloride infusion  . nitroGLYCERIN (NITROSTAT) SL tablet 0.4 mg  . aspirin chewable tablet 324 mg   Discharged via EMS to ED. Stable upon discharge.   Reviewed expectations re: course of current medical issues. Questions answered. Outlined signs and symptoms indicating need for more acute intervention. Patient verbalized understanding. After Visit Summary given.   SUBJECTIVE:  History from: patient. Seen in triage with nurse.  [name] [name] is a 54 y.o. male with h/o DM, HTN, hyperlipidemia who presents with complaint of fairly persistent chest pain described as squeezing and heaviness that radiates to his left arm/shoulder. Abrupt onset 48 hours ago. "Too weak to come in yesterday". Mild nausea at onset. No emesis. With associated SOB. No specific aggravating or alleviating factors reported.  Non-smoker. No heavy alcohol use. No drug use. No recent illnesses.   OBJECTIVE:  Vitals:   12/26/19 1112  BP: 129/86  Pulse: (!) 108  Resp: (!) 22  Temp: 98.4 F (36.9 C)  TempSrc: Oral  SpO2: 97%    General appearance: alert, oriented, appears uncomfortable Eyes: PERRLA; EOMI; conjunctivae normal HENT: normocephalic; atraumatic Neck: supple with FROM Lungs: with mildly labored respirations; overall clear; tachypneic Heart: tachycardic; regular Abdomen: obese; soft Extremities: without edema; without calf swelling or tenderness; symmetrical without gross deformities Skin: warm and dry; without rash or lesions Neuro: normal gait Psychological: alert and cooperative; normal mood and affect  No Known Allergies  Past Medical History:  Diagnosis Date  .  Anxiety   . Chest heaviness   . Chills   . Depression, major   . Diabetes mellitus without complication (HCC)   . Dizzy   . Dyslipidemia   . ED (erectile dysfunction)   . Elevated CPK   . HA (headache)   . Hypertension   . Hypertriglyceridemia   . Insomnia   . Kidney stone   . Left-sided Bell's palsy   . Morbid obesity (HCC)   . Nausea   . No appetite   . Panniculitis    of right lower leg  . PTSD (post-traumatic stress disorder)   . Weakness    Social History   Socioeconomic History  . Marital status: Married    Spouse name: Not on file  . Number of children: 0  . Years of education: 62  . Highest education level: Not on file  Occupational History  . Not on file  Tobacco Use  . Smoking status: Never Smoker  . Smokeless tobacco: Never Used  Vaping Use  . Vaping Use: Never used  Substance and Sexual Activity  . Alcohol use: No  . Drug use: No  . Sexual activity: Not on file  Other Topics Concern  . Not on file  Social History Narrative   Lives with brother in a one story home.  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    Social Determinants of Health   Financial Resource Strain:   . Difficulty of Paying Living Expenses:   Food Insecurity:   . Worried About Programme researcher, broadcasting/film/video in the Last Year:   . [employer] of Food in the Last Year:  Transportation Needs:   . Freight forwarder (Medical):   Marland Kitchen Lack of Transportation (Non-Medical):   Physical Activity:   . Days of Exercise per Week:   . Minutes of Exercise per Session:   Stress:   . Feeling of Stress :   Social Connections:   . Frequency of Communication with Friends and Family:   . Frequency of Social Gatherings with Friends and Family:   . Attends Religious Services:   . Active Member of Clubs or Organizations:   . Attends Banker Meetings:   Marland Kitchen Marital Status:   Intimate Partner Violence:   . Fear of Current or Ex-Partner:   . Emotionally Abused:   Marland Kitchen Physically  Abused:   . Sexually Abused:    Family History  Problem Relation Age of Onset  . Thyroid disease Brother   . Depression Brother   . Heart disease Mother   . Kidney failure Father   . Diabetes Father   . Thyroid disease Sister   . Mental illness Sister   . Heart attack Maternal Grandfather   . Heart failure Paternal Grandmother   . Cancer Paternal Grandfather        bone   Past Surgical History:  Procedure Laterality Date  . NO PAST SURGERIES       [name], MD 12/26/19 1235

## 2019-12-26 NOTE — ED Triage Notes (Signed)
Pt here via EMS from [facility] for eval of dull constant chest pain since Monday. 324 ASA and 1 nitro given by urgent care with no relief. Endorses generalized weakness yesterday. Difficulty even getting out of bed to the bathroom.

## 2019-12-26 NOTE — ED Notes (Signed)
[facility] ED charge nurse was called and told about the pt status.

## 2019-12-26 NOTE — ED Triage Notes (Signed)
Pt reports having chest pain radiates to left ram and left shoulder, weakness and shortness of breath "all the time" for the past 2 days.

## 2019-12-26 NOTE — ED Notes (Signed)
Patient is being discharged from the Urgent Care and sent to the Emergency Department via *EMS. Per dr hagler, patient is in need of higher level of care due to chest pain. Patient is aware and verbalizes understanding of plan of care.  Vitals:   12/26/19 1112 12/26/19 1140  BP: 129/86 121/82  Pulse: (!) 108 98  Resp: (!) 22   Temp: 98.4 F (36.9 C)   SpO2: 97%

## 2019-12-27 ENCOUNTER — Encounter (HOSPITAL_COMMUNITY): Payer: Self-pay | Admitting: Student

## 2019-12-27 LAB — TROPONIN I (HIGH SENSITIVITY): Troponin I (High Sensitivity): 5 ng/L (ref <18)

## 2019-12-27 MED ORDER — SUCRALFATE 1 GM/10ML PO SUSP: 1.0000 g | Freq: Three times a day (TID) | ORAL | 0 refills | Status: DC

## 2019-12-27 MED ORDER — ALUM & MAG HYDROXIDE-SIMETH 200-200-20 MG/5ML PO SUSP
30.0000 mL | Freq: Once | ORAL | Status: AC
  Administered 2019-12-27: 30 mL via ORAL
  Filled 2019-12-27: qty 30

## 2019-12-27 MED ORDER — PANTOPRAZOLE SODIUM 20 MG PO TBEC: 20.0000 mg | DELAYED_RELEASE_TABLET | Freq: Every day | ORAL | 0 refills | Status: DC

## 2019-12-27 MED ORDER — LIDOCAINE VISCOUS HCL 2 % MT SOLN
15.0000 mL | Freq: Once | OROMUCOSAL | Status: AC
  Administered 2019-12-27: 15 mL via ORAL
  Filled 2019-12-27: qty 15

## 2019-12-27 NOTE — Discharge Instructions (Addendum)
You were seen in the emergency department today for chest pain. Your work-up in the emergency department has been overall reassuring. Your labs have been fairly normal and or similar to previous blood work you have had done-your blood sugar was elevated, please be sure to monitor this at home.. Your EKG and the enzyme we use to check your heart did not show an acute heart attack at this time. Your chest x-ray was normal.   We are sending you home with Protonix to take 1 tablet each morning prior to meals as well as Carafate to take prior to each meal and prior to bedtime to try to help with any discomfort.  We have prescribed you new medication(s) today. Discuss the medications prescribed today with your pharmacist as they can have adverse effects and interactions with your other medicines including over the counter and prescribed medications. Seek medical evaluation if you start to experience new or abnormal symptoms after taking one of these medicines, seek care immediately if you start to experience difficulty breathing, feeling of your throat closing, facial swelling, or rash as these could be indications of a more serious allergic reaction  Please follow attached diet guidelines.   We would like you to follow up closely with your primary care provider and/or the cardiologist provided in your discharge instructions within 1-3 days. Return to the ER immediately should you experience any new or worsening symptoms including but not limited to return of pain, worsened pain, new pain, vomiting, shortness of breath, dizziness, lightheadedness, passing out, or any other concerns that you may have.

## 2019-12-27 NOTE — ED Provider Notes (Signed)
[facility] EMERGENCY DEPARTMENT Provider Note   CSN: [id number] Arrival date & time: 12/26/19  1218     History Chief Complaint  Patient presents with  . Chest Pain    [name] [name] is a 54 y.o. male with a history of T2DM, hypertension, dyslipidemia, anxiety, depression, obesity, and PTSD who presents to the emergency department with complaints of chest pain x 4 days. Patient describes the pain as a heaviness/ache to the central chest with some radiation into the LUE. Pain has been constant over the past 2 days, at times he has dyspnea & nausea with the discomfort. No alleviating/aggravating factors, does not seem to worsen with exertion or a deep breath, other than that he does get winded with exertion but this is not a new problem.  He also reports that he has felt generally weak over the past few days.  Seen at urgent care and sent to the emergency department for further evaluation.  Denies syncope, vomiting, abdominal pain, cough, family hx of early CAD, leg pain/swelling, hemoptysis, recent surgery/trauma, recent long travel, hormone use, personal hx of cancer, or hx of DVT/PE. He has not received his COVID 19 vaccine.    HPI     Past Medical History:  Diagnosis Date  . Anxiety   . Chest heaviness   . Chills   . Depression, major   . Diabetes mellitus without complication (HCC)   . Dizzy   . Dyslipidemia   . ED (erectile dysfunction)   . Elevated CPK   . HA (headache)   . Hypertension   . Hypertriglyceridemia   . Insomnia   . Kidney stone   . Left-sided Bell's palsy   . Morbid obesity (HCC)   . Nausea   . No appetite   . Panniculitis    of right lower leg  . PTSD (post-traumatic stress disorder)   . Weakness     Patient Active Problem List   Diagnosis Date Noted  . MDD (major depressive disorder) 10/24/2019  . PTSD (post-traumatic stress disorder) 10/24/2019  . Chest pain 10/05/2017    Past Surgical History:  Procedure Laterality Date    . NO PAST SURGERIES         Family History  Problem Relation Age of Onset  . Thyroid disease Brother   . Depression Brother   . Heart disease Mother   . Kidney failure Father   . Diabetes Father   . Thyroid disease Sister   . Mental illness Sister   . Heart attack Maternal Grandfather   . Heart failure Paternal Grandmother   . Cancer Paternal Grandfather        bone    Social History   Tobacco Use  . Smoking status: Never Smoker  . Smokeless tobacco: Never Used  Vaping Use  . Vaping Use: Never used  Substance Use Topics  . Alcohol use: No  . Drug use: No    Home Medications Prior to Admission medications   Medication Sig Start Date End Date Taking? Authorizing Provider  ALPRAZolam Prudy Feeler) 0.5 MG tablet Take 0.5 mg by mouth 3 (three) times daily as needed for anxiety.    [provider]  ARIPiprazole (ABILIFY) 5 MG tablet Take 1 tablet (5 mg total) by mouth daily. 11/21/19   Shanna Cisco, NP  aspirin EC 81 MG tablet Take 81 mg by mouth daily.    [provider]  atorvastatin (LIPITOR) 20 MG tablet Take 20 mg by mouth daily.  [provider]  buPROPion (WELLBUTRIN XL) 300 MG 24 hr tablet Take 1 tablet (300 mg total) by mouth every morning. 11/21/19   Shanna CiscoParsons, Brittney E, NP  cholecalciferol (VITAMIN D) 1000 units tablet Take 1,000 Units by mouth daily.    [provider]  CINNAMON PO Take 1 capsule by mouth daily.    [provider]  Cyanocobalamin (VITAMIN B-12 PO) Take 1 tablet by mouth daily.    [provider]  DULoxetine (CYMBALTA) 30 MG capsule Take 60 mg by mouth 2 (two) times daily.    [provider]  DULoxetine (CYMBALTA) 60 MG capsule Take 1 capsule (60 mg total) by mouth 2 (two) times daily. 11/21/19   Shanna CiscoParsons, Brittney E, NP  glimepiride (AMARYL) 4 MG tablet Take 4 mg by mouth 2 (two) times daily.    [provider]  lisinopril (PRINIVIL,ZESTRIL) 10 MG tablet Take 10 mg by mouth  daily.    [provider]  metFORMIN (GLUCOPHAGE) 500 MG tablet Take 500 mg by mouth 2 (two) times daily.    [provider]  Multiple Vitamin (MULTIVITAMIN) tablet Take 1 tablet by mouth daily.    [provider]  Omega-3 1000 MG CAPS Take 1,000 mg by mouth daily.    [provider]  omeprazole (PRILOSEC) 20 MG capsule Take 20 mg by mouth daily.    [provider]  Oxcarbazepine (TRILEPTAL) 300 MG tablet Take 1 tablet (300 mg total) by mouth 2 (two) times daily. 11/21/19   Shanna CiscoParsons, Brittney E, NP  promethazine (PHENERGAN) 25 MG tablet Take 25 mg by mouth every 6 (six) hours as needed for nausea or vomiting.    [provider]  sildenafil (REVATIO) 20 MG tablet Take 20 mg by mouth 3 (three) times daily.    [provider]    Allergies    Patient has no known allergies.  Review of Systems   Review of Systems  Constitutional: Negative for chills and fever.  Respiratory: Positive for shortness of breath. Negative for cough.   Cardiovascular: Positive for chest pain. Negative for leg swelling.  Gastrointestinal: Positive for diarrhea (few loose stools) and nausea. Negative for abdominal pain and vomiting.  Neurological: Negative for dizziness and syncope.  All other systems reviewed and are negative.   Physical Exam Updated Vital Signs BP 136/82 (BP Location: Right Arm)   Pulse 60   Temp 98.2 F (36.8 C) (Oral)   Resp 15   SpO2 98%   Physical Exam Vitals and nursing note reviewed.  Constitutional:      General: He is not in acute distress.    Appearance: He is well-developed. He is obese. He is not toxic-appearing.  HENT:     Head: Normocephalic and atraumatic.  Eyes:     General:        Right eye: No discharge.        Left eye: No discharge.     Conjunctiva/sclera: Conjunctivae normal.  Cardiovascular:     Rate and Rhythm: Normal rate and regular rhythm.     Pulses:          Radial pulses are 2+ on the right  side and 2+ on the left side.     Heart sounds: No murmur heard.   Pulmonary:     Effort: Pulmonary effort is normal. No respiratory distress.     Breath sounds: Normal breath sounds. No wheezing, rhonchi or rales.  Chest:     Chest wall: No tenderness.  Abdominal:     General: There is no distension.     Palpations: Abdomen is soft.     Tenderness: There is no abdominal tenderness.  Musculoskeletal:     Cervical back: Neck supple.     Right lower leg: No edema.     Left lower leg: No edema.  Skin:    General: Skin is warm and dry.     Findings: No rash.  Neurological:     Mental Status: He is alert.     Comments: Clear speech.   Psychiatric:        Behavior: Behavior normal.     ED Results / Procedures / Treatments   Labs (all labs ordered are listed, but only abnormal results are displayed) Labs Reviewed  BASIC METABOLIC PANEL - Abnormal; Notable for the following components:      Result Value   Glucose, Bld 236 (*)    All other components within normal limits  CBC  TROPONIN I (HIGH SENSITIVITY)  TROPONIN I (HIGH SENSITIVITY)    EKG None  Radiology DG Chest 2 View  Result Date: 12/26/2019 CLINICAL DATA:  Chest pain for 4 days.  Diabetes and hypertension. EXAM: CHEST - 2 VIEW COMPARISON:  12/15/2018 FINDINGS: The heart size and mediastinal contours are within normal limits. Both lungs are clear. The visualized skeletal structures are unremarkable. IMPRESSION: No active cardiopulmonary disease. Electronically Signed   By: Danae Orleans M.D.   On: 12/26/2019 12:39    Procedures Procedures (including critical care time)  Medications Ordered in ED Medications - No data to display  ED Course  I have reviewed the triage vital signs and the nursing notes.  Pertinent labs & imaging results that were available during my care of the patient were reviewed by me and considered in my medical decision making (see chart for details).    MDM Rules/Calculators/A&P                          Patient presents to the ED with complaints of chest pain x 4 days. Patient is nontoxic, resting comfortably, vitals without significant abnormality, initial tachycardia resolved on my assessment. Benign physical exam.   DDX: ACS, PE, dissection, pneumothorax, pneumonia, GERD, musculoskeletal pain, critical anemia, electrolyte derangement, anxiety.   Additional history obtained:  Additional history obtained from chart & nursing note review. Previous records obtained and reviewed: Most recently seen by cardiology 03/26/2019, plan was for echocardiogram and myocardial perfusion imaging-it appears this was not completed.  His most recent myocardial perfusion imaging on record is from September 2015  EKG: Sinus tachycardia, no STEMI  Lab Tests:  I reviewed and interpreted labs, which included:  CBC, BMP, and troponins-hyperglycemia without acidosis or anion gap elevation.  No anemia.  No significant electrolyte derangement.  Troponins are flat without significant elevation.  Imaging Studies ordered:  Chest x-ray was ordered per triage team, I independently visualized and interpreted imaging which showed no acute process.  3rd troponin WNL.   Hear score of 5 primarily based on patient's risk factors and description of chest discomfort, EKG without significant ischemic changes, his troponins in the emergency department have not had any elevation or significant change, low suspicion for ACS-I called and discussed with cardiology master who will ensure to facilitate close follow-up.  Low risk Wells, doubt pulmonary embolism.  No widened mediastinum on chest x-ray, low suspicion for dissection.  Labs overall reassuring.  His abdomen is nontender without peritoneal signs.  He  does feel improved status post GI cocktail, will trial Carafate and PPI with cardiology and PCP follow-up. I discussed results, treatment plan, need for follow-up, and return precautions with the patient. Provided  opportunity for questions, patient confirmed understanding and is in agreement with plan.   Findings and plan of care discussed with supervising physician Dr. Rubin Payor who is in agreement.   Portions of this note were generated with Scientist, clinical (histocompatibility and immunogenetics). Dictation errors may occur despite best attempts at proofreading.  Final Clinical Impression(s) / ED Diagnoses Final diagnoses:  Chest pain, unspecified type    Rx / DC Orders ED Discharge Orders         Ordered    sucralfate (CARAFATE) 1 GM/10ML suspension  3 times daily with meals & bedtime        12/27/19 1343    pantoprazole (PROTONIX) 20 MG tablet  Daily        12/27/19 [address] Pleas Koch, PA-C 12/27/19 1346    Benjiman Core, MD 12/27/19 1557

## 2019-12-27 NOTE — ED Notes (Signed)
Pt given dc instructions pt verbalizes understanding.  

## 2020-01-08 NOTE — Progress Notes (Deleted)
Cardiology Office Note:    Date:  01/08/2020   ID:  [name] [name], DOB June 14, 1965, MRN [id number]  PCP:  Georgann Housekeeper, MD  [facility] Cardiologist:  No primary care provider on file. *** [facility] Electrophysiologist:  None   Referring MD: Georgann Housekeeper, MD   Chief Complaint:  No chief complaint on file.    Patient Profile:    [name] [name] is a 54 y.o. male with:   Hypertension   Hyperlipidemia   Diabetes mellitus   Anxiety   PTSD  Prior CV studies: *** Myoview 01/16/14 EF 59, diaph atten, no ischemia, low risk  History of Present Illness:    [name] [name] was evaluated by Dr. Eden Emms in 03/2019 for chest pain and shortness of breath.  An echocardiogram and Myoview were ordered but these were not done.   He was recently in the ED with chest pain.  Notes indicate improved symptoms with GI cocktail.  MI was ruled out. Data:  K 3.9, Cr 1.11, Hgb 16.8 Hs-Trop 5 >> 6 >> 5  CXR: no active disease EKG: sinus tachy, HR 109, normal axis, QTc 455, no ST-TW changes (personally reviewed/interpreted)  He returns for follow up.  *** Past Medical History:  Diagnosis Date  . Anxiety   . Chest heaviness   . Chills   . Depression, major   . Diabetes mellitus without complication (HCC)   . Dizzy   . Dyslipidemia   . ED (erectile dysfunction)   . Elevated CPK   . HA (headache)   . Hypertension   . Hypertriglyceridemia   . Insomnia   . Kidney stone   . Left-sided Bell's palsy   . Morbid obesity (HCC)   . Nausea   . No appetite   . Panniculitis    of right lower leg  . PTSD (post-traumatic stress disorder)   . Weakness     Current Medications: No outpatient medications have been marked as taking for the 01/09/20 encounter (Appointment) with Tereso Newcomer T, PA-C.     Allergies:   Patient has no known allergies.   Social History   Tobacco Use  . Smoking status: Never Smoker  . Smokeless tobacco: Never Used  Vaping Use  . Vaping Use: Never used    Substance Use Topics  . Alcohol use: No  . Drug use: No     Family Hx: The patient's family history includes Cancer in his paternal grandfather; Depression in his brother; Diabetes in his father; Heart attack in his maternal grandfather; Heart disease in his mother; Heart failure in his paternal grandmother; Kidney failure in his father; Mental illness in his sister; Thyroid disease in his brother and sister.  ROS   EKGs/Labs/Other Test Reviewed:    EKG:  EKG is *** ordered today.  The ekg ordered today demonstrates ***  Recent Labs: 12/26/2019: BUN 19; Creatinine, Ser 1.11; Hemoglobin 16.8; Platelets 286; Potassium 3.9; Sodium 136   Recent Lipid Panel No results found for: CHOL, TRIG, HDL, CHOLHDL, LDLCALC, LDLDIRECT  Physical Exam:    VS:  There were no vitals taken for this visit.    Wt Readings from Last 3 Encounters:  03/26/19 259 lb (117.5 kg)  10/20/16 250 lb 6 oz (113.6 kg)  10/13/16 248 lb (112.5 kg)     Physical Exam ***  ASSESSMENT & PLAN:    ***  Dispo:  No follow-ups on file.   Medication Adjustments/Labs and Tests Ordered: Current medicines are reviewed at length with the  patient today.  Concerns regarding medicines are outlined above.  Tests Ordered: No orders of the defined types were placed in this encounter.  Medication Changes: No orders of the defined types were placed in this encounter.   Signed, Tereso Newcomer, PA-C  01/08/2020 9:21 PM    [facility] [address] [place], [place], [place]  [address] Phone: [phone]; Fax: [phone]

## 2020-01-09 ENCOUNTER — Ambulatory Visit: Payer: Managed Care, Other (non HMO) | Admitting: Physician Assistant

## 2020-01-10 ENCOUNTER — Telehealth: Payer: Self-pay

## 2020-01-10 DIAGNOSIS — R079 Chest pain, unspecified: Secondary | ICD-10-CM

## 2020-01-10 NOTE — Telephone Encounter (Signed)
From: Wendall Stade, MD  Sent: 01/07/2020 12:48 PM EDT  To: Karl Pock Pinnix, LPN, Nori Riis, RN   Should have f/u myovue and echo    Left message for patient to call back. Ordered test.

## 2020-01-11 ENCOUNTER — Telehealth: Payer: Self-pay | Admitting: *Deleted

## 2020-01-15 ENCOUNTER — Telehealth (HOSPITAL_COMMUNITY): Payer: Self-pay

## 2020-01-15 NOTE — Telephone Encounter (Signed)
Spoke wit the patient, detailed instructions were given. He stated that he understood and would be here for his test. Asked to call back with any questions. S.Satsuki Zillmer EMTP

## 2020-01-17 ENCOUNTER — Other Ambulatory Visit: Payer: Self-pay

## 2020-01-17 ENCOUNTER — Ambulatory Visit (HOSPITAL_COMMUNITY): Payer: Managed Care, Other (non HMO) | Attending: Cardiology

## 2020-01-17 DIAGNOSIS — R079 Chest pain, unspecified: Secondary | ICD-10-CM | POA: Diagnosis present

## 2020-01-17 LAB — MYOCARDIAL PERFUSION IMAGING
LV dias vol: 77 mL (ref 62–150)
LV sys vol: 33 mL
Peak HR: 103 {beats}/min
Rest HR: 71 {beats}/min
SDS: 1
SRS: 0
SSS: 1
TID: 0.93

## 2020-01-17 MED ORDER — TECHNETIUM TC 99M TETROFOSMIN IV KIT
32.5000 | PACK | Freq: Once | INTRAVENOUS | Status: AC | PRN
  Administered 2020-01-17: 32.5 via INTRAVENOUS
  Filled 2020-01-17: qty 33

## 2020-01-17 MED ORDER — REGADENOSON 0.4 MG/5ML IV SOLN
0.4000 mg | Freq: Once | INTRAVENOUS | Status: AC
  Administered 2020-01-17: 0.4 mg via INTRAVENOUS

## 2020-01-17 MED ORDER — TECHNETIUM TC 99M TETROFOSMIN IV KIT
10.2000 | PACK | Freq: Once | INTRAVENOUS | Status: AC | PRN
  Administered 2020-01-17: 10.2 via INTRAVENOUS
  Filled 2020-01-17: qty 11

## 2020-01-18 ENCOUNTER — Telehealth: Payer: Self-pay | Admitting: Cardiovascular Disease

## 2020-01-18 NOTE — Telephone Encounter (Signed)
Patient had stress test on 01/17/20 and echo will be done on 01/25/20.

## 2020-01-18 NOTE — Telephone Encounter (Signed)
Patient is returning call to discuss results from stress test completed on 01/17/20.

## 2020-01-18 NOTE — Telephone Encounter (Signed)
Per Dr. Eden Emms, no ischemia, low risk study, normal EF, good. Patient verbalized understanding. Will send copy to patient's PCP.

## 2020-01-23 ENCOUNTER — Encounter (HOSPITAL_COMMUNITY): Payer: Self-pay | Admitting: Psychiatry

## 2020-01-23 ENCOUNTER — Telehealth (INDEPENDENT_AMBULATORY_CARE_PROVIDER_SITE_OTHER): Payer: 59 | Admitting: Psychiatry

## 2020-01-23 ENCOUNTER — Other Ambulatory Visit: Payer: Self-pay

## 2020-01-23 DIAGNOSIS — F411 Generalized anxiety disorder: Secondary | ICD-10-CM | POA: Diagnosis not present

## 2020-01-23 DIAGNOSIS — F331 Major depressive disorder, recurrent, moderate: Secondary | ICD-10-CM | POA: Diagnosis not present

## 2020-01-23 MED ORDER — ARIPIPRAZOLE 5 MG PO TABS: 5.0000 mg | ORAL_TABLET | Freq: Every day | ORAL | 2 refills | Status: DC

## 2020-01-23 MED ORDER — OXCARBAZEPINE 300 MG PO TABS: 300.0000 mg | ORAL_TABLET | Freq: Two times a day (BID) | ORAL | 2 refills | Status: DC

## 2020-01-23 MED ORDER — PROPRANOLOL HCL 10 MG PO TABS: 10.0000 mg | ORAL_TABLET | Freq: Three times a day (TID) | ORAL | 2 refills | Status: DC

## 2020-01-23 MED ORDER — BUPROPION HCL ER (XL) 450 MG PO TB24: 450.0000 mg | ORAL_TABLET | ORAL | 2 refills | Status: DC

## 2020-01-23 MED ORDER — DULOXETINE HCL 30 MG PO CPEP: 60.0000 mg | ORAL_CAPSULE | Freq: Two times a day (BID) | ORAL | 2 refills | Status: DC

## 2020-01-23 NOTE — Progress Notes (Signed)
BH MD/PA/NP OP Progress Note  01/23/2020 5:01 PM [name] [name]  MRN:  [id number]  Chief Complaint: When I worry it feels like my chest is going to explode.   HPI:  54 year old male seen today for follow up psychiatric evaluation. He has a history of anxiety, depression and PTSD. He is currently managed obTrileptal 300 mg BID, duloxetine 60 mg BID, buproprion 300 mg once daily, Abilify 5 mg once daily, and Xanax 0.5 mg TID (prescribed by his PCP). Patient reports that he is still experiencing symptoms of depression and anxiety.  During the exam he is well-groomed, pleasant, cooperative, and maintains eye contact. Patient reports that he is restless and notes his hands are still shaking like he noted at his previous visit in July. He notes that this may be caused by his excessive anxiety from which he states can come from anything. He notes that during panic attacks his chest feels tight, his heart rate increases, and he feels like he may explode or pass out. Patient also reported being easily distracted, inability to concentrate, racing thoughts, and irritability on a near daily basis. Patient notes that he is often paranoid that someone is after him but does not know who it would be. He stated that he feels depressed "most days" mentioning that he has lost interest in things that once brought him joy. Patient also reports a decreased appetite and thinks that he has lost weight recently.  He denies SI/HI/VAH.  Patient is agreeable to increase Wellbutrin 300mg  to 450 mg daily to help manage symptoms of depression.  He is also agreeable to starting propanolol 10 mg 3 times daily to help manage symptoms of anxiety and restlessness.  He will continue all other medications as prescribed.  No other concerns noted at this time.   Visit Diagnosis:    ICD-10-CM   1. Generalized anxiety disorder  F41.1 DULoxetine (CYMBALTA) 30 MG capsule    propranolol (INDERAL) 10 MG tablet  2. Moderate episode of recurrent  major depressive disorder (HCC)  F33.1 ARIPiprazole (ABILIFY) 5 MG tablet    buPROPion 450 MG TB24    DULoxetine (CYMBALTA) 30 MG capsule    Oxcarbazepine (TRILEPTAL) 300 MG tablet    Past Psychiatric History: anxiety, depression and PTSD  Past Medical History:  Past Medical History:  Diagnosis Date  . Anxiety   . Chest heaviness   . Chills   . Depression, major   . Diabetes mellitus without complication (HCC)   . Dizzy   . Dyslipidemia   . ED (erectile dysfunction)   . Elevated CPK   . HA (headache)   . Hypertension   . Hypertriglyceridemia   . Insomnia   . Kidney stone   . Left-sided Bell's palsy   . Morbid obesity (HCC)   . Nausea   . No appetite   . Panniculitis    of right lower leg  . PTSD (post-traumatic stress disorder)   . Weakness     Past Surgical History:  Procedure Laterality Date  . NO PAST SURGERIES      Family Psychiatric History: Sister schizophrenia and bipolar disorder  Family History:  Family History  Problem Relation Age of Onset  . Thyroid disease Brother   . Depression Brother   . Heart disease Mother   . Kidney failure Father   . Diabetes Father   . Thyroid disease Sister   . Mental illness Sister   . Heart attack Maternal Grandfather   . Heart failure  Paternal Grandmother   . Cancer Paternal Grandfather        bone    Social History:  Social History   Socioeconomic History  . Marital status: Married    Spouse name: Not on file  . Number of children: 0  . Years of education: 66  . Highest education level: Not on file  Occupational History  . Not on file  Tobacco Use  . Smoking status: Never Smoker  . Smokeless tobacco: Never Used  Vaping Use  . Vaping Use: Never used  Substance and Sexual Activity  . Alcohol use: No  . Drug use: No  . Sexual activity: Not on file  Other Topics Concern  . Not on file  Social History Narrative   Lives with brother in a one story home.  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  Education:  high school.    Social Determinants of Health   Financial Resource Strain:   . Difficulty of Paying Living Expenses: Not on file  Food Insecurity:   . Worried About Programme researcher, broadcasting/film/video in the Last Year: Not on file  . Ran Out of Food in the Last Year: Not on file  Transportation Needs:   . Lack of Transportation (Medical): Not on file  . Lack of Transportation (Non-Medical): Not on file  Physical Activity:   . Days of Exercise per Week: Not on file  . Minutes of Exercise per Session: Not on file  Stress:   . Feeling of Stress : Not on file  Social Connections:   . Frequency of Communication with Friends and Family: Not on file  . Frequency of Social Gatherings with Friends and Family: Not on file  . Attends Religious Services: Not on file  . Active Member of Clubs or Organizations: Not on file  . Attends Banker Meetings: Not on file  . Marital Status: Not on file    Allergies: No Known Allergies  Metabolic Disorder Labs: No results found for: HGBA1C, MPG No results found for: PROLACTIN No results found for: CHOL, TRIG, HDL, CHOLHDL, VLDL, LDLCALC No results found for: TSH  Therapeutic Level Labs: No results found for: LITHIUM No results found for: VALPROATE No components found for:  CBMZ  Current Medications: Current Outpatient Medications  Medication Sig Dispense Refill  . ALPRAZolam (XANAX) 0.5 MG tablet Take 0.5 mg by mouth 3 (three) times daily as needed for anxiety.    . ARIPiprazole (ABILIFY) 5 MG tablet Take 1 tablet (5 mg total) by mouth daily. 30 tablet 2  . aspirin EC 81 MG tablet Take 81 mg by mouth daily.    Marland Kitchen atorvastatin (LIPITOR) 20 MG tablet Take 20 mg by mouth daily.    Marland Kitchen buPROPion 450 MG TB24 Take 450 mg by mouth every morning. 30 tablet 2  . cholecalciferol (VITAMIN D) 1000 units tablet Take 1,000 Units by mouth daily.    Marland Kitchen CINNAMON PO Take 1 capsule by mouth daily.    . Cyanocobalamin (VITAMIN B-12 PO) Take 1 tablet by mouth daily.     . DULoxetine (CYMBALTA) 30 MG capsule Take 2 capsules (60 mg total) by mouth 2 (two) times daily. 60 capsule 2  . glimepiride (AMARYL) 4 MG tablet Take 4 mg by mouth 2 (two) times daily.    Marland Kitchen lisinopril (PRINIVIL,ZESTRIL) 10 MG tablet Take 10 mg by mouth daily.    . metFORMIN (GLUCOPHAGE) 500 MG tablet Take 500 mg by mouth 2 (two) times daily.    Marland Kitchen  Multiple Vitamin (MULTIVITAMIN) tablet Take 1 tablet by mouth daily.    . Omega-3 1000 MG CAPS Take 1,000 mg by mouth daily.    Marland Kitchen. omeprazole (PRILOSEC) 20 MG capsule Take 20 mg by mouth daily.    . Oxcarbazepine (TRILEPTAL) 300 MG tablet Take 1 tablet (300 mg total) by mouth 2 (two) times daily. 60 tablet 2  . pantoprazole (PROTONIX) 20 MG tablet Take 1 tablet (20 mg total) by mouth daily. 30 tablet 0  . promethazine (PHENERGAN) 25 MG tablet Take 25 mg by mouth every 6 (six) hours as needed for nausea or vomiting.    . propranolol (INDERAL) 10 MG tablet Take 1 tablet (10 mg total) by mouth 3 (three) times daily. 90 tablet 2  . sildenafil (REVATIO) 20 MG tablet Take 20 mg by mouth 3 (three) times daily.    . sucralfate (CARAFATE) 1 GM/10ML suspension Take 10 mLs (1 g total) by mouth 4 (four) times daily -  with meals and at bedtime. 420 mL 0   No current facility-administered medications for this visit.     Musculoskeletal: Strength & Muscle Tone: Unable to assess due to telehealth visit Gait & Station: Unable to assess due to telehealth visit Patient leans: N/A  Psychiatric Specialty Exam: Review of Systems  There were no vitals taken for this visit.There is no height or weight on file to calculate BMI.  General Appearance: Well Groomed  Eye Contact:  Good  Speech:  Clear and Coherent and Normal Rate  Volume:  Normal  Mood:  Anxious  Affect:  Congruent  Thought Process:  Coherent, Goal Directed and Linear  Orientation:  Full (Time, Place, and Person)  Thought Content: WDL and Logical   Suicidal Thoughts:  No  Homicidal Thoughts:  No   Memory:  Immediate;   Good Recent;   Good Remote;   Good  Judgement:  Good  Insight:  Good  Psychomotor Activity:  Normal  Concentration:  Concentration: Fair and Attention Span: Fair  Recall:  Good  Fund of Knowledge: Good  Language: Good  Akathisia:  No  Handed:  Right  AIMS (if indicated): Not done  Assets:  Communication Skills Desire for Improvement Financial Resources/Insurance Housing Social Support  ADL's:  Intact  Cognition: WNL  Sleep:  Good   Screenings:   Assessment and Plan: Patient endorses symptoms of anxiety and depression. He is agreeable to increase Wellbutrin 300mg  to 450 mg daily to help manage symptoms of depression.  He is also agreeable to starting propanolol 10 mg 3 times daily to help manage symptoms of anxiety and restlessness.  He will continue all other medications as prescribed.  1. Moderate episode of recurrent major depressive disorder (HCC)  Continue- ARIPiprazole (ABILIFY) 5 MG tablet; Take 1 tablet (5 mg total) by mouth daily.  Dispense: 30 tablet; Refill: 2 Increased- buPROPion 450 MG TB24; Take 450 mg by mouth every morning.  Dispense: 30 tablet; Refill: 2 Continue- DULoxetine (CYMBALTA) 30 MG capsule; Take 2 capsules (60 mg total) by mouth 2 (two) times daily.  Dispense: 60 capsule; Refill: 2 Continue- Oxcarbazepine (TRILEPTAL) 300 MG tablet; Take 1 tablet (300 mg total) by mouth 2 (two) times daily.  Dispense: 60 tablet; Refill: 2  2. Generalized anxiety disorder  Increased- DULoxetine (CYMBALTA) 30 MG capsule; Take 2 capsules (60 mg total) by mouth 2 (two) times daily.  Dispense: 60 capsule; Refill: 2 Start- propranolol (INDERAL) 10 MG tablet; Take 1 tablet (10 mg total) by mouth 3 (three) times daily.  Dispense: 90 tablet; Refill: 2  Follow-up in 3 months  Shanna Cisco, NP 01/23/2020, 5:01 PM

## 2020-01-23 NOTE — Telephone Encounter (Signed)
Error

## 2020-01-25 ENCOUNTER — Ambulatory Visit (HOSPITAL_COMMUNITY): Payer: Managed Care, Other (non HMO) | Attending: Cardiology

## 2020-01-25 ENCOUNTER — Other Ambulatory Visit: Payer: Self-pay

## 2020-01-25 DIAGNOSIS — R079 Chest pain, unspecified: Secondary | ICD-10-CM | POA: Insufficient documentation

## 2020-01-25 LAB — ECHOCARDIOGRAM COMPLETE
Area-P 1/2: 5.27 cm2
S' Lateral: 1.7 cm

## 2020-01-25 MED ORDER — PERFLUTREN LIPID MICROSPHERE
1.0000 mL | INTRAVENOUS | Status: AC | PRN
  Administered 2020-01-25: 3 mL via INTRAVENOUS

## 2020-02-12 ENCOUNTER — Other Ambulatory Visit: Payer: Managed Care, Other (non HMO)

## 2020-02-15 ENCOUNTER — Telehealth (HOSPITAL_COMMUNITY): Payer: 59

## 2020-02-21 ENCOUNTER — Ambulatory Visit: Payer: Managed Care, Other (non HMO) | Admitting: Cardiology

## 2020-02-21 NOTE — Progress Notes (Deleted)
Cardiology Office Note  Date:  02/21/2020   ID:  [name] [name], DOB 1965-11-01, MRN [id number]  PCP:  Georgann Housekeeper, MD  Cardiologist:  Dr. Eden Emms  _____________  Chest Pain  _____________   History of Present Illness: [name] [name] is a 54 y.o. male with pmh od HTN, HLD, DM2, anxiety/PTSD who is being seen for chest pain and sob. PAtietn seen in Dr. Tresa Endo in 2015 for atypical chest pain. Myoview was normal with EF 59%. Patient was seen last hear 03/2019 by Dr. Eden Emms for dyspea which seemed to be exertional in the setting of obesity.   Patient was seen in the ER 12/27/18 for chest pain.  Vitals were stable. EKG showed ST with no ischemic changes. Troponin was negative x 3. Labs otherwise reassuring. Trialed on carafate and PPI and set up to see PCP.  Myoview 9/921 showed no ischemia, low risk, normal EF.  Echo was ordered, performed 01/25/20 and it showed LVEF 65-70%, G1DD, aortic dilation noted which was mild to 19mm.   Today, he denies symptoms of palpitations, chest pain, shortness of breath, orthopnea, PND, lower extremity edema, claudication, dizziness, presyncope, syncope, bleeding, or neurologic sequela. The patient is tolerating medications without difficulties and is otherwise without complaint today.      Past Medical History:  Diagnosis Date  . Anxiety   . Chest heaviness   . Chills   . Depression, major   . Diabetes mellitus without complication (HCC)   . Dizzy   . Dyslipidemia   . ED (erectile dysfunction)   . Elevated CPK   . HA (headache)   . Hypertension   . Hypertriglyceridemia   . Insomnia   . Kidney stone   . Left-sided Bell's palsy   . Morbid obesity (HCC)   . Nausea   . No appetite   . Panniculitis    of right lower leg  . PTSD (post-traumatic stress disorder)   . Weakness    Past Surgical History:  Procedure Laterality Date  . NO PAST SURGERIES     _____________  Current Outpatient Medications  Medication Sig Dispense Refill  .  ALPRAZolam (XANAX) 0.5 MG tablet Take 0.5 mg by mouth 3 (three) times daily as needed for anxiety.    . ARIPiprazole (ABILIFY) 5 MG tablet Take 1 tablet (5 mg total) by mouth daily. 30 tablet 2  . aspirin EC 81 MG tablet Take 81 mg by mouth daily.    Marland Kitchen atorvastatin (LIPITOR) 20 MG tablet Take 20 mg by mouth daily.    Marland Kitchen buPROPion 450 MG TB24 Take 450 mg by mouth every morning. 30 tablet 2  . cholecalciferol (VITAMIN D) 1000 units tablet Take 1,000 Units by mouth daily.    Marland Kitchen CINNAMON PO Take 1 capsule by mouth daily.    . Cyanocobalamin (VITAMIN B-12 PO) Take 1 tablet by mouth daily.    . DULoxetine (CYMBALTA) 30 MG capsule Take 2 capsules (60 mg total) by mouth 2 (two) times daily. 60 capsule 2  . glimepiride (AMARYL) 4 MG tablet Take 4 mg by mouth 2 (two) times daily.    Marland Kitchen lisinopril (PRINIVIL,ZESTRIL) 10 MG tablet Take 10 mg by mouth daily.    . metFORMIN (GLUCOPHAGE) 500 MG tablet Take 500 mg by mouth 2 (two) times daily.    . Multiple Vitamin (MULTIVITAMIN) tablet Take 1 tablet by mouth daily.    . Omega-3 1000 MG CAPS Take 1,000 mg by mouth daily.    Marland Kitchen omeprazole (PRILOSEC) 20  MG capsule Take 20 mg by mouth daily.    . Oxcarbazepine (TRILEPTAL) 300 MG tablet Take 1 tablet (300 mg total) by mouth 2 (two) times daily. 60 tablet 2  . pantoprazole (PROTONIX) 20 MG tablet Take 1 tablet (20 mg total) by mouth daily. 30 tablet 0  . promethazine (PHENERGAN) 25 MG tablet Take 25 mg by mouth every 6 (six) hours as needed for nausea or vomiting.    . propranolol (INDERAL) 10 MG tablet Take 1 tablet (10 mg total) by mouth 3 (three) times daily. 90 tablet 2  . sildenafil (REVATIO) 20 MG tablet Take 20 mg by mouth 3 (three) times daily.    . sucralfate (CARAFATE) 1 GM/10ML suspension Take 10 mLs (1 g total) by mouth 4 (four) times daily -  with meals and at bedtime. 420 mL 0   No current facility-administered medications for this visit.   _____________   Allergies:   Patient has no known allergies.    _____________   Social History:  The patient  reports that he has never smoked. He has never used smokeless tobacco. He reports that he does not drink alcohol and does not use drugs.  _____________   Family History:  The patient's family history includes Cancer in his paternal grandfather; Depression in his brother; Diabetes in his father; Heart attack in his maternal grandfather; Heart disease in his mother; Heart failure in his paternal grandmother; Kidney failure in his father; Mental illness in his sister; Thyroid disease in his brother and sister.  _____________   ROS:  Please see the history of present illness.   Positive for ***,   All other systems are reviewed and negative.  _____________   PHYSICAL EXAM: VS:  There were no vitals taken for this visit. , BMI There is no height or weight on file to calculate BMI. GEN: Well nourished, well developed, in no acute distress  HEENT: normal  Neck: no JVD, carotid bruits, or masses Cardiac: RRR; no murmurs, rubs, or gallops. No clubbing, cyanosis, edema.  Radials/DP/PT 2+ and equal bilaterally.  Respiratory:  clear to auscultation bilaterally, normal work of breathing GI: soft, nontender, nondistended, + BS MS: no deformity or atrophy  Skin: warm and dry, no rash Neuro:  Strength and sensation are intact Psych: euthymic mood, full affect _____________  EKG:   The ekg ordered today shows ***  Recent Labs: 12/26/2019: BUN 19; Creatinine, Ser 1.11; Hemoglobin 16.8; Platelets 286; Potassium 3.9; Sodium 136  No results found for requested labs within last 8760 hours.  CrCl cannot be calculated (Patient's most recent lab result is older than the maximum 21 days allowed.).  Wt Readings from Last 3 Encounters:  01/17/20 244 lb (110.7 kg)  03/26/19 259 lb (117.5 kg)  10/20/16 250 lb 6 oz (113.6 kg)    _____________   ASSESSMENT AND PLAN:  Atypical chest pain - Seen in the ED last month and work-up was essentially unremarkable -  Myoview was low risk with no ischemia and normal EF - Echo showed normal EF 65-70%, G1DD, and mildly dilated aortic root.  -    HTN - BP stable - lisinopril 10mg  daily  HLD - Atorvastatin 20 mg daily  DM2 - followed by PCP  Aortic dilation - 15mm on recent echo - plan for yearly CTA, due 01/2021 _____________    Disposition:   FU with Dr. 02/2021 in 1 year   Signed, Percy Winterrowd Eden Emms, PA-C 02/21/2020 9:24 AM    _____________ [facility]  [address] [place] [address]  [phone] (office) [phone] (fax)

## 2020-04-10 ENCOUNTER — Other Ambulatory Visit (HOSPITAL_COMMUNITY): Payer: Self-pay | Admitting: Psychiatry

## 2020-04-22 ENCOUNTER — Telehealth (INDEPENDENT_AMBULATORY_CARE_PROVIDER_SITE_OTHER): Payer: 59 | Admitting: Psychiatry

## 2020-04-22 ENCOUNTER — Encounter (HOSPITAL_COMMUNITY): Payer: Self-pay | Admitting: Psychiatry

## 2020-04-22 ENCOUNTER — Other Ambulatory Visit: Payer: Self-pay

## 2020-04-22 DIAGNOSIS — F331 Major depressive disorder, recurrent, moderate: Secondary | ICD-10-CM

## 2020-04-22 DIAGNOSIS — F411 Generalized anxiety disorder: Secondary | ICD-10-CM

## 2020-04-22 MED ORDER — OXCARBAZEPINE 300 MG PO TABS: 300.0000 mg | ORAL_TABLET | Freq: Two times a day (BID) | ORAL | 2 refills | Status: DC

## 2020-04-22 MED ORDER — BUPROPION HCL ER (XL) 450 MG PO TB24: 450.0000 mg | ORAL_TABLET | ORAL | 2 refills | Status: DC

## 2020-04-22 MED ORDER — HYDROXYZINE HCL 25 MG PO TABS: ORAL_TABLET | ORAL | 2 refills | Status: DC

## 2020-04-22 MED ORDER — ARIPIPRAZOLE 5 MG PO TABS: 5.0000 mg | ORAL_TABLET | Freq: Every day | ORAL | 2 refills | Status: DC

## 2020-04-22 MED ORDER — PROPRANOLOL HCL 10 MG PO TABS: 10.0000 mg | ORAL_TABLET | Freq: Three times a day (TID) | ORAL | 2 refills | Status: DC

## 2020-04-22 MED ORDER — DULOXETINE HCL 30 MG PO CPEP: 60.0000 mg | ORAL_CAPSULE | Freq: Two times a day (BID) | ORAL | 2 refills | Status: DC

## 2020-04-22 NOTE — Progress Notes (Signed)
BH MD/PA/NP OP Progress Note Virtual Visit via Telephone Note  I connected with [name] [name] on 04/22/20 at  3:00 PM EST by telephone and verified that I am speaking with the correct person using two identifiers.  Location: Patient: home Provider: Clinic   I discussed the limitations, risks, security and privacy concerns of performing an evaluation and management service by telephone and the availability of in person appointments. I also discussed with the patient that there may be a patient responsible charge related to this service. The patient expressed understanding and agreed to proceed.   I provided 30 minutes of non-face-to-face time during this encounter.   04/22/2020 4:00 PM [name] [name]  MRN:  [id number]  Chief Complaint: "'I thinks the meds are working. It takes a lot for me to explode.   HPI:  54 year old male seen today for follow up psychiatric evaluation. He has a history of anxiety, depression and PTSD. He is currently managed onTrileptal 300 mg BID, duloxetine 60 mg BID, buproprion 450 mg once daily, Abilify 5 mg once daily, propanolol 10 mg three times, and Xanax 0.5 mg TID (prescribed by his PCP). Patient reports that he is doing well on his current medication regiman.  Patient unable to log on virtually so the assessment was done over the phone.  During the exam he is pleasant, cooperative, and engaged in conversation.  Patient notes that he occasionally has depression and anxiety however reports that this medication overall manages his anxiety and depression.  Provider conducted a PHQ-9 and patient scored a 19.  Provider also conducted a GAD-7 and patient scored 13.  He notes that since starting propranolol his anxiety has been better and also reports that since increasing Wellbutrin his depression has been better.  He informed provider that his appetite has increased since last visit and endorses sleeping more (over 12 hours).  Patient notes that although he is  sleeping a lot he feels more rested.   He denies SI/HI/VAH or paranoia.  No medication changes made today.  Patient agreeable to continue medications as prescribed.  He will follow-up with provider in 3 months.  No other concerns noted at this time.    Visit Diagnosis:    ICD-10-CM   1. Moderate episode of recurrent major depressive disorder (HCC)  F33.1 ARIPiprazole (ABILIFY) 5 MG tablet    buPROPion HCl ER, XL, 450 MG TB24    DULoxetine (CYMBALTA) 30 MG capsule    Oxcarbazepine (TRILEPTAL) 300 MG tablet  2. Generalized anxiety disorder  F41.1 DULoxetine (CYMBALTA) 30 MG capsule    hydrOXYzine (ATARAX/VISTARIL) 25 MG tablet    propranolol (INDERAL) 10 MG tablet    Past Psychiatric History: anxiety, depression and PTSD  Past Medical History:  Past Medical History:  Diagnosis Date  . Anxiety   . Chest heaviness   . Chills   . Depression, major   . Diabetes mellitus without complication (HCC)   . Dizzy   . Dyslipidemia   . ED (erectile dysfunction)   . Elevated CPK   . HA (headache)   . Hypertension   . Hypertriglyceridemia   . Insomnia   . Kidney stone   . Left-sided Bell's palsy   . Morbid obesity (HCC)   . Nausea   . No appetite   . Panniculitis    of right lower leg  . PTSD (post-traumatic stress disorder)   . Weakness     Past Surgical History:  Procedure Laterality Date  . NO PAST  SURGERIES      Family Psychiatric History: Sister schizophrenia and bipolar disorder  Family History:  Family History  Problem Relation Age of Onset  . Thyroid disease Brother   . Depression Brother   . Heart disease Mother   . Kidney failure Father   . Diabetes Father   . Thyroid disease Sister   . Mental illness Sister   . Heart attack Maternal Grandfather   . Heart failure Paternal Grandmother   . Cancer Paternal Grandfather        bone    Social History:  Social History   Socioeconomic History  . Marital status: Married    Spouse name: Not on file  . Number of  children: 0  . Years of education: 40  . Highest education level: Not on file  Occupational History  . Not on file  Tobacco Use  . Smoking status: Never Smoker  . Smokeless tobacco: Never Used  Vaping Use  . Vaping Use: Never used  Substance and Sexual Activity  . Alcohol use: No  . Drug use: No  . Sexual activity: Not on file  Other Topics Concern  . Not on file  Social History Narrative   Lives with brother in a one story home.  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    Social Determinants of Health   Financial Resource Strain: Not on file  Food Insecurity: Not on file  Transportation Needs: Not on file  Physical Activity: Not on file  Stress: Not on file  Social Connections: Not on file    Allergies: No Known Allergies  Metabolic Disorder Labs: No results found for: HGBA1C, MPG No results found for: PROLACTIN No results found for: CHOL, TRIG, HDL, CHOLHDL, VLDL, LDLCALC No results found for: TSH  Therapeutic Level Labs: No results found for: LITHIUM No results found for: VALPROATE No components found for:  CBMZ  Current Medications: Current Outpatient Medications  Medication Sig Dispense Refill  . ALPRAZolam (XANAX) 0.5 MG tablet Take 0.5 mg by mouth 3 (three) times daily as needed for anxiety.    . ARIPiprazole (ABILIFY) 5 MG tablet Take 1 tablet (5 mg total) by mouth daily. 30 tablet 2  . aspirin EC 81 MG tablet Take 81 mg by mouth daily.    Marland Kitchen atorvastatin (LIPITOR) 20 MG tablet Take 20 mg by mouth daily.    Marland Kitchen buPROPion HCl ER, XL, 450 MG TB24 Take 450 mg by mouth every morning. 30 tablet 2  . cholecalciferol (VITAMIN D) 1000 units tablet Take 1,000 Units by mouth daily.    Marland Kitchen CINNAMON PO Take 1 capsule by mouth daily.    . Cyanocobalamin (VITAMIN B-12 PO) Take 1 tablet by mouth daily.    . DULoxetine (CYMBALTA) 30 MG capsule Take 2 capsules (60 mg total) by mouth 2 (two) times daily. 60 capsule 2  . glimepiride (AMARYL) 4 MG tablet Take 4  mg by mouth 2 (two) times daily.    . hydrOXYzine (ATARAX/VISTARIL) 25 MG tablet TAKE 1 TABLET BY MOUTH FOUR TIMES A DAY AND TAKE 2 TABLETS BY MOUTH AT BEDTIME FOR ANXIETY,PANIC,AGITATION, AND SLEEP 180 tablet 2  . lisinopril (PRINIVIL,ZESTRIL) 10 MG tablet Take 10 mg by mouth daily.    . metFORMIN (GLUCOPHAGE) 500 MG tablet Take 500 mg by mouth 2 (two) times daily.    . Multiple Vitamin (MULTIVITAMIN) tablet Take 1 tablet by mouth daily.    . Omega-3 1000 MG CAPS Take 1,000 mg by  mouth daily.    Marland Kitchen. omeprazole (PRILOSEC) 20 MG capsule Take 20 mg by mouth daily.    . Oxcarbazepine (TRILEPTAL) 300 MG tablet Take 1 tablet (300 mg total) by mouth 2 (two) times daily. 60 tablet 2  . pantoprazole (PROTONIX) 20 MG tablet Take 1 tablet (20 mg total) by mouth daily. 30 tablet 0  . promethazine (PHENERGAN) 25 MG tablet Take 25 mg by mouth every 6 (six) hours as needed for nausea or vomiting.    . propranolol (INDERAL) 10 MG tablet Take 1 tablet (10 mg total) by mouth 3 (three) times daily. 90 tablet 2  . sildenafil (REVATIO) 20 MG tablet Take 20 mg by mouth 3 (three) times daily.    . sucralfate (CARAFATE) 1 GM/10ML suspension Take 10 mLs (1 g total) by mouth 4 (four) times daily -  with meals and at bedtime. 420 mL 0   No current facility-administered medications for this visit.     Musculoskeletal: Strength & Muscle Tone: Unable to assess due to telephone visit Gait & Station: Unable to assess due to telehealth visit Patient leans: N/A  Psychiatric Specialty Exam: Review of Systems  There were no vitals taken for this visit.There is no height or weight on file to calculate BMI.  General Appearance: Unable to assess due to telephone visit  Eye Contact:  Unable to assess due to telephone visit  Speech:  Clear and Coherent and Normal Rate  Volume:  Normal  Mood:  Euthymic and However notes that he occasionally has symptoms of anxiety and depression  Affect:  Congruent  Thought Process:  Coherent,  Goal Directed and Linear  Orientation:  Full (Time, Place, and Person)  Thought Content: WDL and Logical   Suicidal Thoughts:  No  Homicidal Thoughts:  No  Memory:  Immediate;   Good Recent;   Good Remote;   Good  Judgement:  Good  Insight:  Good  Psychomotor Activity:  Normal  Concentration:  Concentration: Good and Attention Span: Good  Recall:  Good  Fund of Knowledge: Good  Language: Good  Akathisia:  No  Handed:  Right  AIMS (if indicated): Not done  Assets:  Communication Skills Desire for Improvement Financial Resources/Insurance Housing Social Support  ADL's:  Intact  Cognition: WNL  Sleep:  Good   Screenings: GAD-7   [facility]  Total GAD-7 Score 13    PHQ2-9   Flow[facility]  PHQ-2 Total Score 5  PHQ-9 Total Score 19       Assessment and Plan: Patient notes that he occasionally has anxiety and depression however notes that overall he is doing well on his medication regimen. No medication changes made today.  Patient agreeable to continue medications as prescribed.  He will follow-up with provider in 3 months.  No other concerns noted at this time.    1. Moderate episode of recurrent major depressive disorder (HCC)  Continue- ARIPiprazole (ABILIFY) 5 MG tablet; Take 1 tablet (5 mg total) by mouth daily.  Dispense: 30 tablet; Refill: 2 Increased- buPROPion 450 MG TB24; Take 450 mg by mouth every morning.  Dispense: 30 tablet; Refill: 2 Continue- DULoxetine (CYMBALTA) 30 MG capsule; Take 2 capsules (60 mg total) by mouth 2 (two) times daily.  Dispense: 60 capsule; Refill: 2 Continue- Oxcarbazepine (TRILEPTAL) 300 MG tablet; Take 1 tablet (300 mg total) by mouth 2 (two) times daily.  Dispense:  60 tablet; Refill: 2  2. Generalized anxiety disorder  Increased- DULoxetine (CYMBALTA) 30 MG capsule; Take 2 capsules (60 mg  total) by mouth 2 (two) times daily.  Dispense: 60 capsule; Refill: 2 Start- propranolol (INDERAL) 10 MG tablet; Take 1 tablet (10 mg total) by mouth 3 (three) times daily.  Dispense: 90 tablet; Refill: 2  Follow-up in 3 months

## 2020-05-20 NOTE — Progress Notes (Incomplete)
CARDIOLOGY CONSULT NOTE       Patient ID: [name] [name] MRN: [id number] DOB/AGE: October 10, 1965 55 y.o.  Admit date: (Not on file) Referring Physician: Eula Listen Primary Physician: Georgann Housekeeper, MD Primary Cardiologist: New Reason for Consultation: Dyspnea Cardiac Risk Factors   Active Problems:   * No active hospital problems. *   HPI:  55 y.o. history of HTN,, HLD and DM. First seen 03/26/19 for dyspnea  He also had Anxiety and PTSD He has been seen by neuro in past for psychosis Has been on xanax, cymbalta and welbutrin Does not drink  Excessively or use drugs Do not see MRI being done CT June 2018 no acute disease Seen by Dr Tresa Endo in 2015 For atypical chest pain and had normal myovue  EF 59%   Reviewed office not from Dr Eula Listen 11/21/18 Tele visit Only mentions non specific dyspnea with activity in setting of morbid obesity associated with back pain He describes tightness in his chest all the time Been more sedentary with COVID as he works for [employer] and [employer] with many areas not being open.   Myovue 01/17/20 diaphragmatic attenuation no ischemia EF 57% Echo 01/25/20 EF 65-70% no bad valves Aorta 4.0 cm   Lab review LDL 85 A1c 6.9 Cr 1.18 K 4.5 Hct 47.4 TSH 1.62  ***   ROS All other systems reviewed and negative except as noted above  Past Medical History:  Diagnosis Date  . Anxiety   . Chest heaviness   . Chills   . Depression, major   . Diabetes mellitus without complication (HCC)   . Dizzy   . Dyslipidemia   . ED (erectile dysfunction)   . Elevated CPK   . HA (headache)   . Hypertension   . Hypertriglyceridemia   . Insomnia   . Kidney stone   . Left-sided Bell's palsy   . Morbid obesity (HCC)   . Nausea   . No appetite   . Panniculitis    of right lower leg  . PTSD (post-traumatic stress disorder)   . Weakness     Family History  Problem Relation Age of Onset  . Thyroid disease Brother   . Depression Brother   . Heart disease Mother   . Kidney  failure Father   . Diabetes Father   . Thyroid disease Sister   . Mental illness Sister   . Heart attack Maternal Grandfather   . Heart failure Paternal Grandmother   . Cancer Paternal Grandfather        bone    Social History   Socioeconomic History  . Marital status: Married    Spouse name: Not on file  . Number of children: 0  . Years of education: 26  . Highest education level: Not on file  Occupational History  . Not on file  Tobacco Use  . Smoking status: Never Smoker  . Smokeless tobacco: Never Used  Vaping Use  . Vaping Use: Never used  Substance and Sexual Activity  . Alcohol use: No  . Drug use: No  . Sexual activity: Not on file  Other Topics Concern  . Not on file  Social History Narrative   Lives with brother in a one story home.  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    Social Determinants of Health   Financial Resource Strain: Not on file  Food Insecurity: Not on file  Transportation Needs: Not on file  Physical Activity: Not on file  Stress: Not  on file  Social Connections: Not on file  Intimate Partner Violence: Not on file    Past Surgical History:  Procedure Laterality Date  . NO PAST SURGERIES       Physical Exam: There were no vitals taken for this visit.   Affect appropriate Overweight white male  HEENT: normal Neck supple with no adenopathy JVP normal no bruits no thyromegaly Lungs clear with no wheezing and good diaphragmatic motion Heart:  S1/S2 no murmur, no rub, gallop or click PMI normal Abdomen: benighn, BS positve, no tenderness, no AAA no bruit.  No HSM or HJR Distal pulses intact with no bruits No edema Neuro non-focal Skin warm and dry No muscular weakness   Labs:   Lab Results  Component Value Date   WBC 9.0 12/26/2019   HGB 16.8 12/26/2019   HCT 49.4 12/26/2019   MCV 87.6 12/26/2019   PLT 286 12/26/2019   Radiology: No results found.  EKG:  10/14/16 SR rate 77 normal 03/26/19 SR rate  90 normal    ASSESSMENT AND PLAN:   1. Dyspnea:  Seems functional echo with normal EF observe   2. HTN:  Well controlled.  Continue current medications and low sodium Dash type diet.   3. HLD:  On statin labs with primary  4. DM:  Discussed low carb diet.  Target hemoglobin A1c is 6.5 or less.  Continue current medications. 5. Neuro:  PTSD , MS changes f/u neuro currently on wellbutrin cymbalta and xanax Also sees psychiatry 6. Chest Pain : atypical in diabetic non ischemic myovue 01/17/20 observe  F/U with cardiology PRN   Signed: Charlton Haws 05/20/2020, 12:27 PM

## 2020-05-23 ENCOUNTER — Ambulatory Visit: Payer: Managed Care, Other (non HMO) | Admitting: Cardiovascular Disease

## 2020-06-05 NOTE — Progress Notes (Deleted)
CARDIOLOGY CONSULT NOTE       Patient ID: [name] [name] MRN: [id number] DOB/AGE: October 10, 1965 55 y.o.  Admit date: (Not on file) Referring Physician: Eula Listen Primary Physician: Georgann Housekeeper, MD Primary Cardiologist: New Reason for Consultation: Dyspnea Cardiac Risk Factors   Active Problems:   * No active hospital problems. *   HPI:  55 y.o. history of HTN,, HLD and DM. First seen 03/26/19 for dyspnea  He also had Anxiety and PTSD He has been seen by neuro in past for psychosis Has been on xanax, cymbalta and welbutrin Does not drink  Excessively or use drugs Do not see MRI being done CT June 2018 no acute disease Seen by Dr Tresa Endo in 2015 For atypical chest pain and had normal myovue  EF 59%   Reviewed office not from Dr Eula Listen 11/21/18 Tele visit Only mentions non specific dyspnea with activity in setting of morbid obesity associated with back pain He describes tightness in his chest all the time Been more sedentary with COVID as he works for [employer] and [employer] with many areas not being open.   Myovue 01/17/20 diaphragmatic attenuation no ischemia EF 57% Echo 01/25/20 EF 65-70% no bad valves Aorta 4.0 cm   Lab review LDL 85 A1c 6.9 Cr 1.18 K 4.5 Hct 47.4 TSH 1.62  ***   ROS All other systems reviewed and negative except as noted above  Past Medical History:  Diagnosis Date  . Anxiety   . Chest heaviness   . Chills   . Depression, major   . Diabetes mellitus without complication (HCC)   . Dizzy   . Dyslipidemia   . ED (erectile dysfunction)   . Elevated CPK   . HA (headache)   . Hypertension   . Hypertriglyceridemia   . Insomnia   . Kidney stone   . Left-sided Bell's palsy   . Morbid obesity (HCC)   . Nausea   . No appetite   . Panniculitis    of right lower leg  . PTSD (post-traumatic stress disorder)   . Weakness     Family History  Problem Relation Age of Onset  . Thyroid disease Brother   . Depression Brother   . Heart disease Mother   . Kidney  failure Father   . Diabetes Father   . Thyroid disease Sister   . Mental illness Sister   . Heart attack Maternal Grandfather   . Heart failure Paternal Grandmother   . Cancer Paternal Grandfather        bone    Social History   Socioeconomic History  . Marital status: Married    Spouse name: Not on file  . Number of children: 0  . Years of education: 26  . Highest education level: Not on file  Occupational History  . Not on file  Tobacco Use  . Smoking status: Never Smoker  . Smokeless tobacco: Never Used  Vaping Use  . Vaping Use: Never used  Substance and Sexual Activity  . Alcohol use: No  . Drug use: No  . Sexual activity: Not on file  Other Topics Concern  . Not on file  Social History Narrative   Lives with brother in a one story home.  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    Social Determinants of Health   Financial Resource Strain: Not on file  Food Insecurity: Not on file  Transportation Needs: Not on file  Physical Activity: Not on file  Stress: Not  on file  Social Connections: Not on file  Intimate Partner Violence: Not on file    Past Surgical History:  Procedure Laterality Date  . NO PAST SURGERIES       Physical Exam: There were no vitals taken for this visit.   Affect appropriate Overweight white male  HEENT: normal Neck supple with no adenopathy JVP normal no bruits no thyromegaly Lungs clear with no wheezing and good diaphragmatic motion Heart:  S1/S2 no murmur, no rub, gallop or click PMI normal Abdomen: benighn, BS positve, no tenderness, no AAA no bruit.  No HSM or HJR Distal pulses intact with no bruits No edema Neuro non-focal Skin warm and dry No muscular weakness   Labs:   Lab Results  Component Value Date   WBC 9.0 12/26/2019   HGB 16.8 12/26/2019   HCT 49.4 12/26/2019   MCV 87.6 12/26/2019   PLT 286 12/26/2019   Radiology: No results found.  EKG:  10/14/16 SR rate 77 normal 03/26/19 SR rate  90 normal    ASSESSMENT AND PLAN:   1. Dyspnea:  Seems functional echo with normal EF observe   2. HTN:  Well controlled.  Continue current medications and low sodium Dash type diet.   3. HLD:  On statin labs with primary  4. DM:  Discussed low carb diet.  Target hemoglobin A1c is 6.5 or less.  Continue current medications. 5. Neuro:  PTSD , MS changes f/u neuro currently on wellbutrin cymbalta and xanax Also sees psychiatry 6. Chest Pain : atypical in diabetic non ischemic myovue 01/17/20 observe  F/U with cardiology PRN   Signed: Charlton Haws 06/05/2020, 8:19 AM

## 2020-06-06 ENCOUNTER — Ambulatory Visit: Payer: Managed Care, Other (non HMO) | Admitting: Cardiovascular Disease

## 2020-07-24 ENCOUNTER — Other Ambulatory Visit (HOSPITAL_COMMUNITY): Payer: Self-pay | Admitting: Psychiatry

## 2020-07-24 DIAGNOSIS — F331 Major depressive disorder, recurrent, moderate: Secondary | ICD-10-CM

## 2020-09-05 ENCOUNTER — Other Ambulatory Visit (HOSPITAL_COMMUNITY): Payer: Self-pay | Admitting: Psychiatry

## 2020-09-05 DIAGNOSIS — F411 Generalized anxiety disorder: Secondary | ICD-10-CM

## 2020-09-05 DIAGNOSIS — F331 Major depressive disorder, recurrent, moderate: Secondary | ICD-10-CM

## 2020-09-08 ENCOUNTER — Telehealth (HOSPITAL_COMMUNITY): Payer: Self-pay | Admitting: *Deleted

## 2020-09-08 NOTE — Telephone Encounter (Signed)
VM left on writers phone fri pm requesting new rx be sent to [facility] for patients Cymbalta. His sig is for 2 pills BID but Rx written for 60 pills so he is running out in 15 days. Will notify Dr Doyne Keel of request from pharmacy to writer for 120 pills or a 30 day supply.

## 2020-09-08 NOTE — Telephone Encounter (Signed)
Medications refilled and sent to preferred pharmacy.

## 2020-09-26 ENCOUNTER — Other Ambulatory Visit (HOSPITAL_COMMUNITY): Payer: Self-pay | Admitting: Psychiatry

## 2020-09-26 DIAGNOSIS — F331 Major depressive disorder, recurrent, moderate: Secondary | ICD-10-CM

## 2020-10-23 ENCOUNTER — Other Ambulatory Visit (HOSPITAL_COMMUNITY): Payer: Self-pay | Admitting: Psychiatry

## 2020-10-23 DIAGNOSIS — F331 Major depressive disorder, recurrent, moderate: Secondary | ICD-10-CM

## 2020-11-15 IMAGING — CR DG ABDOMEN 2V
3 series · 3 of 3 positions shown · non-contrast
Comparison: October 25, 2010.

CLINICAL DATA: Acute left upper quadrant abdominal pain.

EXAM:
ABDOMEN - 2 VIEW

[w abdomen upright]
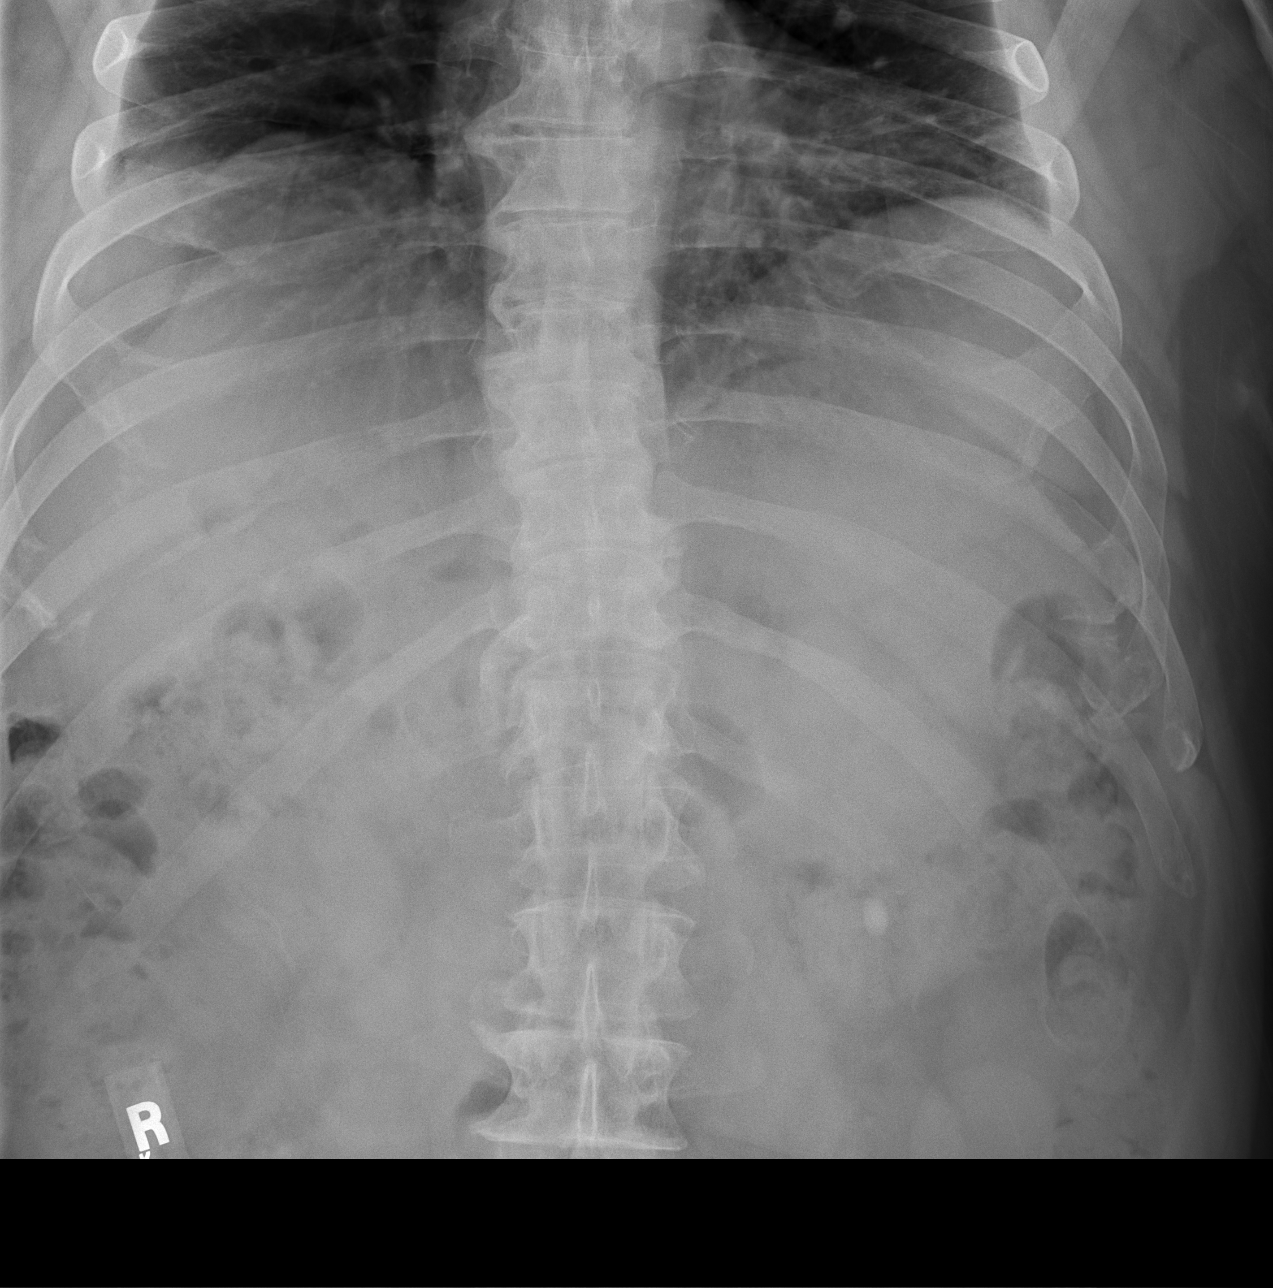

[t abdomen supine (1 of 2)]
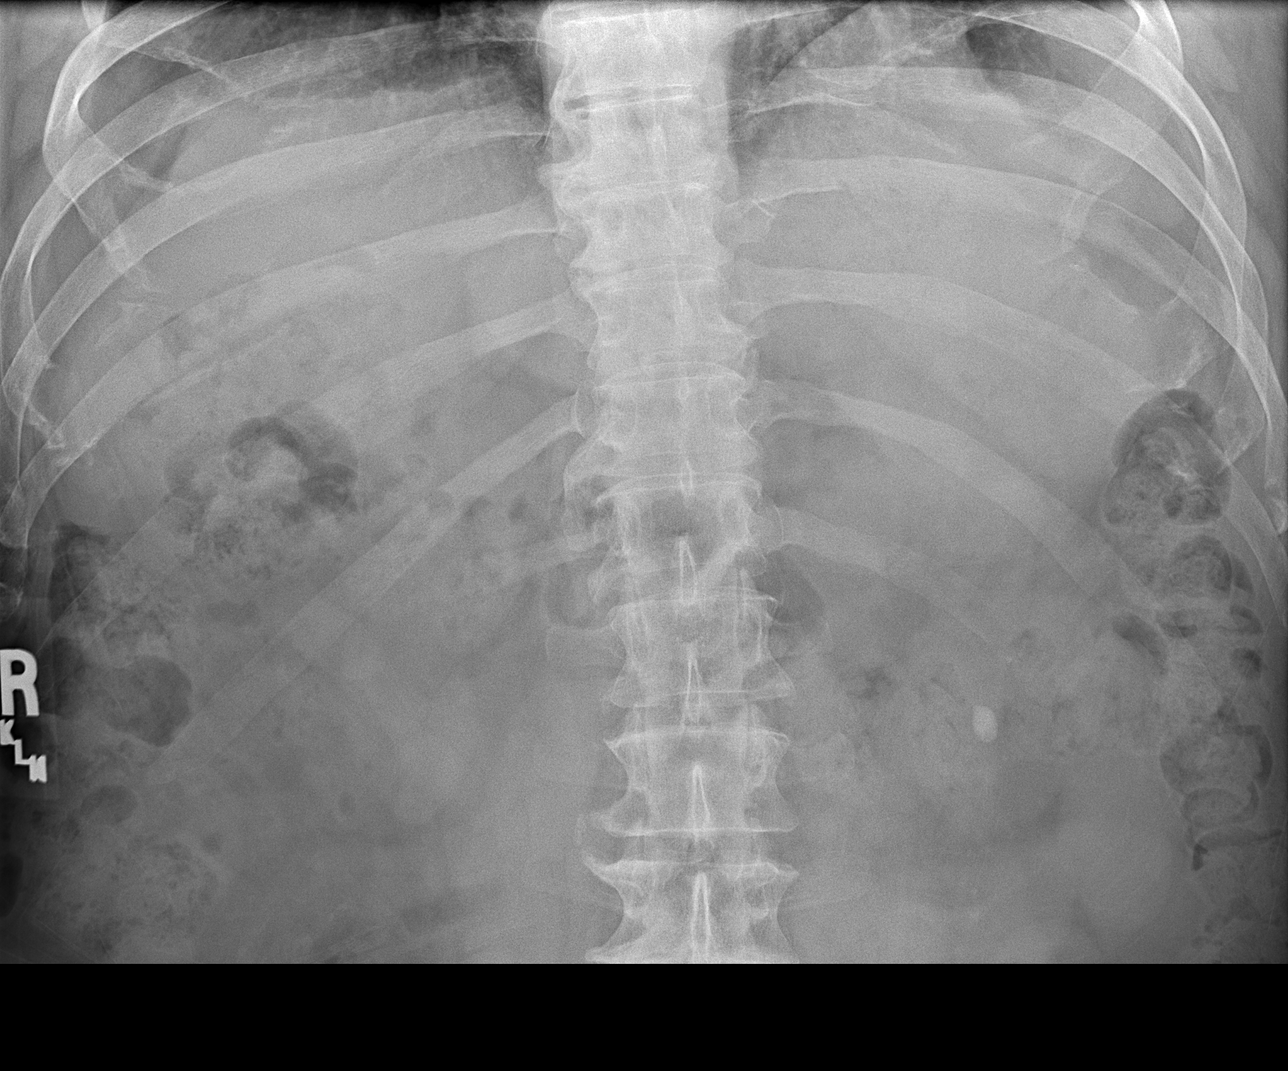

[t abdomen supine (2 of 2)]
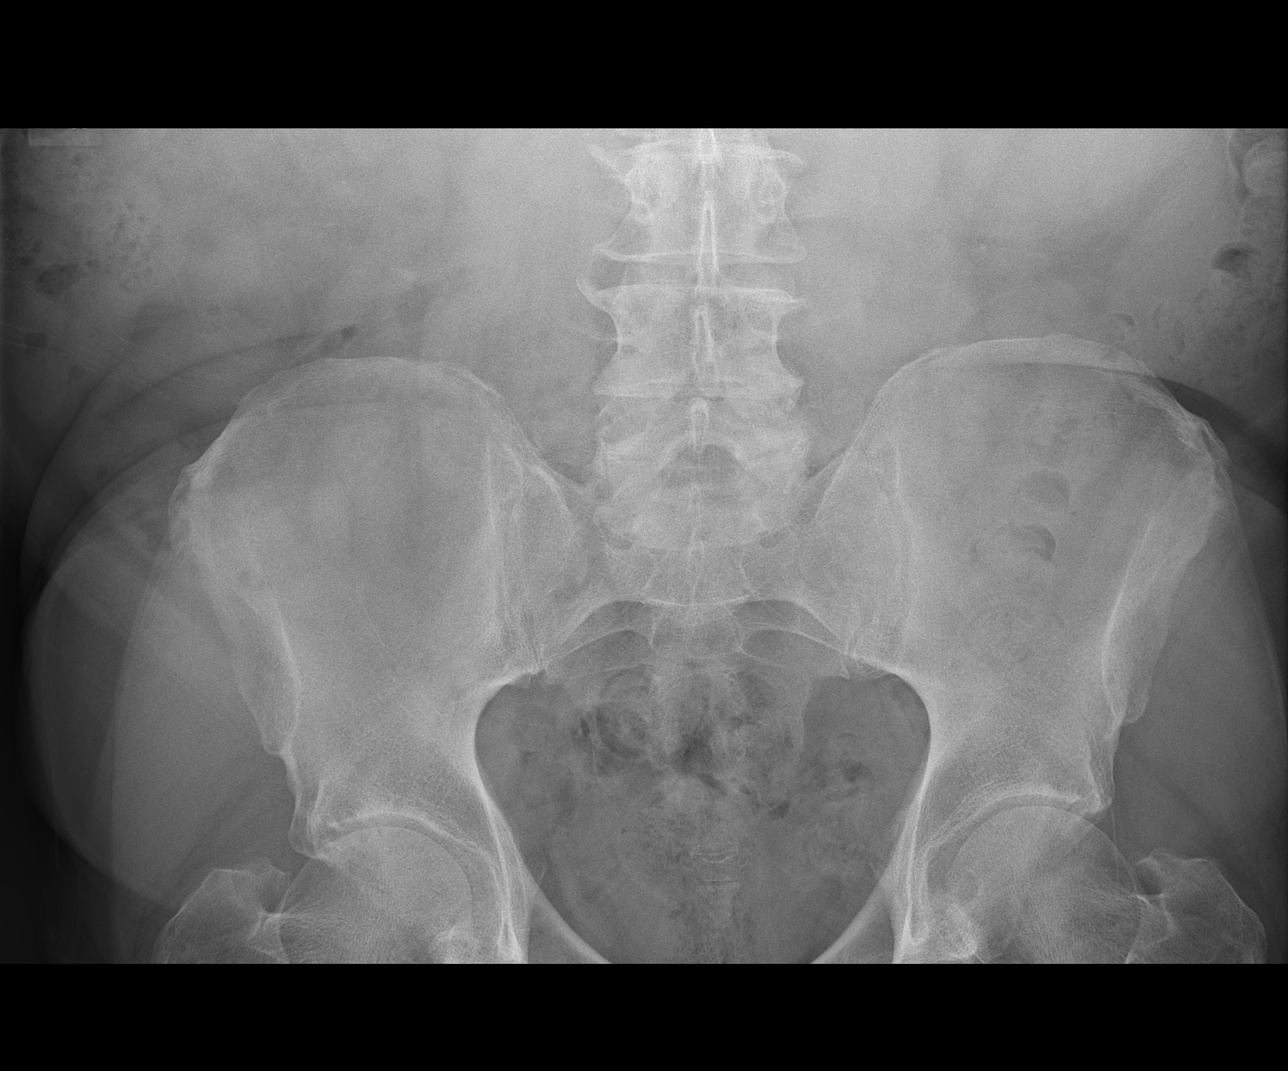

[3 of 3 positions shown; findings below may reference images not displayed]

FINDINGS: The bowel gas pattern is normal. There is no evidence of free air.
Stable left renal calculus is noted.
IMPRESSION: Stable left renal calculus. No evidence of bowel obstruction or
ileus.

## 2021-01-06 ENCOUNTER — Other Ambulatory Visit (HOSPITAL_COMMUNITY): Payer: Self-pay | Admitting: Psychiatry

## 2021-01-06 DIAGNOSIS — F411 Generalized anxiety disorder: Secondary | ICD-10-CM

## 2021-01-06 DIAGNOSIS — F331 Major depressive disorder, recurrent, moderate: Secondary | ICD-10-CM

## 2021-01-07 ENCOUNTER — Telehealth (HOSPITAL_COMMUNITY): Payer: Self-pay | Admitting: *Deleted

## 2021-01-07 ENCOUNTER — Other Ambulatory Visit (HOSPITAL_COMMUNITY): Payer: Self-pay | Admitting: Psychiatry

## 2021-01-07 DIAGNOSIS — F411 Generalized anxiety disorder: Secondary | ICD-10-CM

## 2021-01-07 DIAGNOSIS — F331 Major depressive disorder, recurrent, moderate: Secondary | ICD-10-CM

## 2021-01-07 MED ORDER — ARIPIPRAZOLE 5 MG PO TABS: 5.0000 mg | ORAL_TABLET | Freq: Every day | ORAL | 2 refills | Status: DC

## 2021-01-07 MED ORDER — PROPRANOLOL HCL 10 MG PO TABS: 10.0000 mg | ORAL_TABLET | Freq: Three times a day (TID) | ORAL | 2 refills | Status: DC

## 2021-01-07 NOTE — Telephone Encounter (Signed)
Call from [facility] stating patient told her we would be sending over rx for him till his newly scheduled appt in Oct. I did not tell him this and I am usually the one taking phone calls from patients re medicine. He has not been seen since Dec 21, so he will need to be a walk in before any medicine would be given to him.

## 2021-02-03 ENCOUNTER — Other Ambulatory Visit (HOSPITAL_COMMUNITY): Payer: Self-pay | Admitting: Psychiatry

## 2021-02-03 DIAGNOSIS — F331 Major depressive disorder, recurrent, moderate: Secondary | ICD-10-CM

## 2021-02-13 ENCOUNTER — Other Ambulatory Visit: Payer: Self-pay

## 2021-02-13 ENCOUNTER — Telehealth (HOSPITAL_COMMUNITY): Payer: 59 | Admitting: Psychiatry

## 2021-04-01 ENCOUNTER — Other Ambulatory Visit (HOSPITAL_COMMUNITY): Payer: Self-pay | Admitting: Psychiatry

## 2021-04-01 DIAGNOSIS — F331 Major depressive disorder, recurrent, moderate: Secondary | ICD-10-CM

## 2021-04-28 ENCOUNTER — Other Ambulatory Visit (HOSPITAL_COMMUNITY): Payer: Self-pay | Admitting: Psychiatry

## 2021-04-28 DIAGNOSIS — F411 Generalized anxiety disorder: Secondary | ICD-10-CM

## 2021-05-05 ENCOUNTER — Other Ambulatory Visit (HOSPITAL_COMMUNITY): Payer: Self-pay | Admitting: Psychiatry

## 2021-05-05 DIAGNOSIS — F411 Generalized anxiety disorder: Secondary | ICD-10-CM

## 2021-05-15 ENCOUNTER — Encounter (HOSPITAL_COMMUNITY): Payer: Self-pay | Admitting: Psychiatry

## 2021-05-15 ENCOUNTER — Telehealth (INDEPENDENT_AMBULATORY_CARE_PROVIDER_SITE_OTHER): Payer: 59 | Admitting: Psychiatry

## 2021-05-15 DIAGNOSIS — F322 Major depressive disorder, single episode, severe without psychotic features: Secondary | ICD-10-CM | POA: Insufficient documentation

## 2021-05-15 DIAGNOSIS — F411 Generalized anxiety disorder: Secondary | ICD-10-CM | POA: Diagnosis not present

## 2021-05-15 MED ORDER — DULOXETINE HCL 40 MG PO CPEP: 40.0000 mg | ORAL_CAPSULE | Freq: Two times a day (BID) | ORAL | 3 refills | Status: DC

## 2021-05-15 MED ORDER — HYDROXYZINE HCL 25 MG PO TABS: ORAL_TABLET | ORAL | 2 refills | Status: DC

## 2021-05-15 MED ORDER — OXCARBAZEPINE 300 MG PO TABS: 300.0000 mg | ORAL_TABLET | Freq: Two times a day (BID) | ORAL | 3 refills | Status: DC

## 2021-05-15 MED ORDER — BUPROPION HCL ER (XL) 150 MG PO TB24: ORAL_TABLET | ORAL | 3 refills | Status: DC

## 2021-05-15 MED ORDER — ARIPIPRAZOLE 10 MG PO TABS: 10.0000 mg | ORAL_TABLET | Freq: Every day | ORAL | 3 refills | Status: DC

## 2021-05-15 MED ORDER — PROPRANOLOL HCL 10 MG PO TABS: 10.0000 mg | ORAL_TABLET | Freq: Three times a day (TID) | ORAL | 2 refills | Status: DC

## 2021-05-15 MED ORDER — BUPROPION HCL ER (XL) 300 MG PO TB24: 300.0000 mg | ORAL_TABLET | Freq: Every day | ORAL | 3 refills | Status: DC

## 2021-05-15 NOTE — Progress Notes (Signed)
BH MD/PA/NP OP Progress Note Virtual Visit via Telephone Note  I connected with [name] [name] on 05/15/21 at  8:00 AM EST by telephone and verified that I am speaking with the correct person using two identifiers.  Location: Patient: home Provider: Clinic   I discussed the limitations, risks, security and privacy concerns of performing an evaluation and management service by telephone and the availability of in person appointments. I also discussed with the patient that there may be a patient responsible charge related to this service. The patient expressed understanding and agreed to proceed.   I provided 30 minutes of non-face-to-face time during this encounter.   05/15/2021 8:18 AM [name] [name]  MRN:  [id number]  Chief Complaint: "'Things have been rough".   HPI:  56 year old male seen today for follow up psychiatric evaluation. He has a history of anxiety, depression and PTSD. He is currently managed onTrileptal 300 mg BID, duloxetine 30 mg BID, buproprion 450 mg once daily, Abilify 5 mg once daily, propanolol 10 mg three times, and Xanax 0.5 mg TID (prescribed by his PCP). Patient reports his medications are somewhat effective in managing his psychiatric conditions.  Patient unable to log on virtually so the assessment was done over the phone.  During the exam he is pleasant, cooperative, and engaged in conversation.  Patient notes that thing have been rough over the last 2 months. He informed Probation officer that he is generally happy during the holidays however notes that this year he has been increasingly depressed and anxious. He informed Probation officer that he lacks motivation to do things he once enjoyed like going to the gym. He also notes that he is anxious about his job stability. Patient reports that he finds support from his fiance. Today provider conducted a PHQ-9 and patient scored a 23, at his last visit he scored a 19.  Provider also conducted a GAD-7 and patient scored 20, at his last  visit he scored a 19.  He notes that his appetite has been poor and reports losing 14 pounds. He also endorses hypersomnia noting that he sleeps more than 12 hours nightly. Patient endorses passive SI but denies wanting to harm himself. He denies SI/HI/VAH, mania, or paranoia.    Today patient agreeable to increase Abilify 5 mg to 10 mg to help manage depression. He is also agreeable to increased Cymbalta 30 mg BID to 40 mg BID to help manage anxiety and depression. He will continue all other medications as prescribed.  No other concerns noted at this time.    Visit Diagnosis:    ICD-10-CM   1. Severe depression (HCC)  F32.2 ARIPiprazole (ABILIFY) 10 MG tablet    buPROPion (WELLBUTRIN XL) 150 MG 24 hr tablet    buPROPion (WELLBUTRIN XL) 300 MG 24 hr tablet    DULoxetine 40 MG CPEP    Oxcarbazepine (TRILEPTAL) 300 MG tablet    2. Generalized anxiety disorder  F41.1 DULoxetine 40 MG CPEP    hydrOXYzine (ATARAX) 25 MG tablet    propranolol (INDERAL) 10 MG tablet       Past Psychiatric History: anxiety, depression and PTSD  Past Medical History:  Past Medical History:  Diagnosis Date   Anxiety    Chest heaviness    Chills    Depression, major    Diabetes mellitus without complication ([place])    Dizzy    Dyslipidemia    ED (erectile dysfunction)    Elevated CPK    HA (headache)  Hypertension    Hypertriglyceridemia    Insomnia    Kidney stone    Left-sided Bell's palsy    Morbid obesity (HCC)    Nausea    No appetite    Panniculitis    of right lower leg   PTSD (post-traumatic stress disorder)    Weakness     Past Surgical History:  Procedure Laterality Date   NO PAST SURGERIES      Family Psychiatric History: Sister schizophrenia and bipolar disorder  Family History:  Family History  Problem Relation Age of Onset   Thyroid disease Brother    Depression Brother    Heart disease Mother    Kidney failure Father    Diabetes Father    Thyroid disease Sister     Mental illness Sister    Heart attack Maternal Grandfather    Heart failure Paternal Grandmother    Cancer Paternal Grandfather        bone    Social History:  Social History   Socioeconomic History   Marital status: Single    Spouse name: Not on file   Number of children: 0   Years of education: 12   Highest education level: Not on file  Occupational History   Not on file  Tobacco Use   Smoking status: Never   Smokeless tobacco: Never  Vaping Use   Vaping Use: Never used  Substance and Sexual Activity   Alcohol use: No   Drug use: No   Sexual activity: Not on file  Other Topics Concern   Not on file  Social History Narrative   Lives with brother in a one story home.  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    Social Determinants of Health   Financial Resource Strain: Not on file  Food Insecurity: Not on file  Transportation Needs: Not on file  Physical Activity: Not on file  Stress: Not on file  Social Connections: Not on file    Allergies: No Known Allergies  Metabolic Disorder Labs: No results found for: HGBA1C, MPG No results found for: PROLACTIN No results found for: CHOL, TRIG, HDL, CHOLHDL, VLDL, LDLCALC No results found for: TSH  Therapeutic Level Labs: No results found for: LITHIUM No results found for: VALPROATE No components found for:  CBMZ  Current Medications: Current Outpatient Medications  Medication Sig Dispense Refill   ALPRAZolam (XANAX) 0.5 MG tablet Take 0.5 mg by mouth 3 (three) times daily as needed for anxiety.     ARIPiprazole (ABILIFY) 10 MG tablet Take 1 tablet (10 mg total) by mouth daily. 30 tablet 3   aspirin EC 81 MG tablet Take 81 mg by mouth daily.     atorvastatin (LIPITOR) 20 MG tablet Take 20 mg by mouth daily.     buPROPion (WELLBUTRIN XL) 150 MG 24 hr tablet TAKE 1 TABLET BY MOUTH EVERY MORNING WITH 300MG  MG (450MG  TOTAL) 30 tablet 3   buPROPion (WELLBUTRIN XL) 300 MG 24 hr tablet Take 1 tablet  (300 mg total) by mouth daily. 30 tablet 3   cholecalciferol (VITAMIN D) 1000 units tablet Take 1,000 Units by mouth daily.     CINNAMON PO Take 1 capsule by mouth daily.     Cyanocobalamin (VITAMIN B-12 PO) Take 1 tablet by mouth daily.     DULoxetine 40 MG CPEP Take 40 mg by mouth 2 (two) times daily. 60 capsule 3   glimepiride (AMARYL) 4 MG tablet Take 4 mg by mouth 2 (  two) times daily.     hydrOXYzine (ATARAX) 25 MG tablet TAKE 1 TABLET BY MOUTH FOUR TIMES A DAY & TAKE 2 TABLETS EVERY NIGHT AT BEDTIME FOR ANXIETY, PANIC, AGITATION, AND SLEEP 180 tablet 2   lisinopril (PRINIVIL,ZESTRIL) 10 MG tablet Take 10 mg by mouth daily.     metFORMIN (GLUCOPHAGE) 500 MG tablet Take 500 mg by mouth 2 (two) times daily.     Multiple Vitamin (MULTIVITAMIN) tablet Take 1 tablet by mouth daily.     Omega-3 1000 MG CAPS Take 1,000 mg by mouth daily.     omeprazole (PRILOSEC) 20 MG capsule Take 20 mg by mouth daily.     Oxcarbazepine (TRILEPTAL) 300 MG tablet Take 1 tablet (300 mg total) by mouth 2 (two) times daily. 60 tablet 3   pantoprazole (PROTONIX) 20 MG tablet Take 1 tablet (20 mg total) by mouth daily. 30 tablet 0   promethazine (PHENERGAN) 25 MG tablet Take 25 mg by mouth every 6 (six) hours as needed for nausea or vomiting.     propranolol (INDERAL) 10 MG tablet Take 1 tablet (10 mg total) by mouth 3 (three) times daily. 90 tablet 2   sildenafil (REVATIO) 20 MG tablet Take 20 mg by mouth 3 (three) times daily.     sucralfate (CARAFATE) 1 GM/10ML suspension Take 10 mLs (1 g total) by mouth 4 (four) times daily -  with meals and at bedtime. 420 mL 0   No current facility-administered medications for this visit.     Musculoskeletal: Strength & Muscle Tone:  Unable to assess due to telephone visit Gait & Station:  Unable to assess due to telehealth visit Patient leans: N/A  Psychiatric Specialty Exam: Review of Systems  There were no vitals taken for this visit.There is no height or weight on  file to calculate BMI.  General Appearance:  Unable to assess due to telephone visit  Eye Contact:   Unable to assess due to telephone visit  Speech:  Clear and Coherent and Normal Rate  Volume:  Normal  Mood:  Anxious and Depressed  Affect:  Congruent  Thought Process:  Coherent, Goal Directed and Linear  Orientation:  Full (Time, Place, and Person)  Thought Content: WDL and Logical   Suicidal Thoughts:  Yes.  without intent/plan  Homicidal Thoughts:  No  Memory:  Immediate;   Good Recent;   Good Remote;   Good  Judgement:  Good  Insight:  Good  Psychomotor Activity:   Unable to assess due to telephone visit  Concentration:  Concentration: Good and Attention Span: Good  Recall:  Good  Fund of Knowledge: Good  Language: Good  Akathisia:   Unable to assess due to telephone visit  Handed:  Right  AIMS (if indicated): Not done  Assets:  Communication Skills Desire for Improvement Financial Resources/Insurance Housing Social Support  ADL's:  Intact  Cognition: WNL  Sleep:  Good   Screenings: GAD-7    Flowsheet Row Video Visit from 05/15/2021 in [facility] Video Visit from 04/22/2020 in [facility]  Total GAD-7 Score 20 13      PHQ2-9    Flowsheet Row Video Visit from 05/15/2021 in [facility] Video Visit from 04/22/2020 in [facility]  PHQ-2 Total Score 6 5  PHQ-9 Total Score 23 19      Flowsheet Row Video Visit from 05/15/2021 in [place] Error: [phone]  should not be populated when Q6 is No        Assessment and Plan: Patient endorses symptoms of anxiety, depression, and hypersomnia. Today patient agreeable to increase Abilify 5 mg to 10 mg to help manage depression. He is also agreeable to increased Cymbalta 30 mg BID to 40 mg BID to help manage anxiety and depression. He will continue all other  medications as prescribed.   1. Generalized anxiety disorder  Increased- DULoxetine 40 MG CPEP; Take 40 mg by mouth 2 (two) times daily.  Dispense: 60 capsule; Refill: 3 Continue- hydrOXYzine (ATARAX) 25 MG tablet; TAKE 1 TABLET BY MOUTH FOUR TIMES A DAY & TAKE 2 TABLETS EVERY NIGHT AT BEDTIME FOR ANXIETY, PANIC, AGITATION, AND SLEEP  Dispense: 180 tablet; Refill: 2 Continue- propranolol (INDERAL) 10 MG tablet; Take 1 tablet (10 mg total) by mouth 3 (three) times daily.  Dispense: 90 tablet; Refill: 2  2. Severe depression (HCC)  Increased- ARIPiprazole (ABILIFY) 10 MG tablet; Take 1 tablet (10 mg total) by mouth daily.  Dispense: 30 tablet; Refill: 3 Continue- buPROPion (WELLBUTRIN XL) 150 MG 24 hr tablet; TAKE 1 TABLET BY MOUTH EVERY MORNING WITH 300MG  MG (450MG  TOTAL)  Dispense: 30 tablet; Refill: 3 Continue- buPROPion (WELLBUTRIN XL) 300 MG 24 hr tablet; Take 1 tablet (300 mg total) by mouth daily.  Dispense: 30 tablet; Refill: 3 Increased- DULoxetine 40 MG CPEP; Take 40 mg by mouth 2 (two) times daily.  Dispense: 60 capsule; Refill: 3 Continue- Oxcarbazepine (TRILEPTAL) 300 MG tablet; Take 1 tablet (300 mg total) by mouth 2 (two) times daily.  Dispense: 60 tablet; Refill: 3    Follow-up in 3 months

## 2021-06-11 ENCOUNTER — Telehealth (HOSPITAL_COMMUNITY): Payer: Self-pay | Admitting: *Deleted

## 2021-06-11 ENCOUNTER — Telehealth (HOSPITAL_COMMUNITY): Payer: Self-pay

## 2021-06-11 NOTE — Telephone Encounter (Signed)
RECEIVED A FAX FROM [facility] ON [address] REQUESTING A PA ON PATIENT'S  DULOXETINE 40MG  CPEP CAPSULE CIG[employer] PRESCRIPTION COVERAGE  SENT FOR REVIEW  WAITING ON DETERMINATION

## 2021-06-11 NOTE — Telephone Encounter (Signed)
VM from patient stating he has been 10 days with out his Duloxetine and really needs it. I called his pharmacy this am which is [place] and they were unable to process it without a PA being done, no matter what mg she put in could it go thru without a PA, the amount he takes is over the FDA recommendation.

## 2021-06-18 ENCOUNTER — Telehealth (HOSPITAL_COMMUNITY): Payer: Self-pay

## 2021-06-18 NOTE — Telephone Encounter (Signed)
CIG[employer] PRES[employer] COVERAGE APPROVED  DULOXETINE 40MG  CPEP CASE # EFFECTIVE 06/17/2021 TO 06/17/2022  NOTIFIED PHARMACY. PHARMACIST STATED THAT THEY WILL CONTACT PATIENT WHEN MEDICATION IS FILLED AND READY FOR PICKUP

## 2021-06-30 ENCOUNTER — Telehealth (HOSPITAL_COMMUNITY): Payer: Self-pay | Admitting: *Deleted

## 2021-06-30 NOTE — Telephone Encounter (Signed)
Rosann Auerbach has Approved:  DULoxetine 40 MG CPEP   Start : 06/17/2021    End: 06/17/2022

## 2021-07-01 ENCOUNTER — Telehealth (HOSPITAL_COMMUNITY): Payer: Self-pay | Admitting: *Deleted

## 2021-07-01 ENCOUNTER — Other Ambulatory Visit (HOSPITAL_COMMUNITY): Payer: Self-pay | Admitting: Psychiatry

## 2021-07-01 MED ORDER — DULOXETINE HCL 60 MG PO CPEP: 60.0000 mg | ORAL_CAPSULE | Freq: Every day | ORAL | 3 refills | Status: DC

## 2021-07-01 MED ORDER — DULOXETINE HCL 20 MG PO CPEP: 20.0000 mg | ORAL_CAPSULE | Freq: Every day | ORAL | 2 refills | Status: DC

## 2021-07-01 NOTE — Telephone Encounter (Signed)
VM from patient late yesterday to say he was struggling without his Duloxetine. Peer entered a note it was approved thru [place], but he does not have [employer], Friday would not approve the 40 mg BID. Discussed issue with [place], his pharmacy and his insurance would approve 60 mg qd and 20 mg qd. Consulted Dr Ronne Binning to change his rx and esign it to [place]. Patient notified it was being prepared for him but the dosing had to be changed to satisfy his insurance. He expressed his appreciation.

## 2021-07-17 NOTE — Progress Notes (Unsigned)
Cardiology Office Note:    Date:  07/17/2021   ID:  [name] [name], DOB 1965/08/01, MRN [id number]  PCP:  Georgann Housekeeper, MD   [facility] Providers Cardiologist:  Charlton Haws, MD { Click to update primary MD,subspecialty MD or APP then REFRESH:1}    Referring MD: Georgann Housekeeper, MD     History of Present Illness:    [name] [name] is a 56 y.o. male with a hx of ***  Past Medical History:  Diagnosis Date   Anxiety    Chest heaviness    Chills    Depression, major    Diabetes mellitus without complication (HCC)    Dizzy    Dyslipidemia    ED (erectile dysfunction)    Elevated CPK    HA (headache)    Hypertension    Hypertriglyceridemia    Insomnia    Kidney stone    Left-sided Bell's palsy    Morbid obesity (HCC)    Nausea    No appetite    Panniculitis    of right lower leg   PTSD (post-traumatic stress disorder)    Weakness     Past Surgical History:  Procedure Laterality Date   NO PAST SURGERIES      Current Medications: No outpatient medications have been marked as taking for the 07/23/21 encounter (Appointment) with Janetta Hora, PA-C.     Allergies:   Patient has no known allergies.   Social History   Socioeconomic History   Marital status: Single    Spouse name: Not on file   Number of children: 0   Years of education: 12   Highest education level: Not on file  Occupational History   Not on file  Tobacco Use   Smoking status: Never   Smokeless tobacco: Never  Vaping Use   Vaping Use: Never used  Substance and Sexual Activity   Alcohol use: No   Drug use: No   Sexual activity: Not on file  Other Topics Concern   Not on file  Social History Narrative   Lives with brother in a one story home.  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    Social Determinants of Health   Financial Resource Strain: Not on file  Food Insecurity: Not on file  Transportation Needs: Not on file  Physical Activity: Not on  file  Stress: Not on file  Social Connections: Not on file     Family History: The patient's ***family history includes Cancer in his paternal grandfather; Depression in his brother; Diabetes in his father; Heart attack in his maternal grandfather; Heart disease in his mother; Heart failure in his paternal grandmother; Kidney failure in his father; Mental illness in his sister; Thyroid disease in his brother and sister.  ROS:   Please see the history of present illness.    *** All other systems reviewed and are negative.  EKGs/Labs/Other Studies Reviewed:    The following studies were reviewed today: ***  EKG:  EKG is *** ordered today.  The ekg ordered today demonstrates ***  Recent Labs: No results found for requested labs within last 8760 hours.  Recent Lipid Panel No results found for: CHOL, TRIG, HDL, CHOLHDL, VLDL, LDLCALC, LDLDIRECT   Risk Assessment/Calculations:   {Does this patient have ATRIAL FIBRILLATION?:[phone]}       Physical Exam:    VS:  There were no vitals taken for this visit.    Wt Readings from Last 3 Encounters:  01/17/20  244 lb (110.7 kg)  03/26/19 259 lb (117.5 kg)  10/20/16 250 lb 6 oz (113.6 kg)     GEN: *** Well nourished, well developed in no acute distress HEENT: Normal NECK: No JVD; No carotid bruits LYMPHATICS: No lymphadenopathy CARDIAC: ***RRR, no murmurs, rubs, gallops RESPIRATORY:  Clear to auscultation without rales, wheezing or rhonchi  ABDOMEN: Soft, non-tender, non-distended MUSCULOSKELETAL:  No edema; No deformity  SKIN: Warm and dry NEUROLOGIC:  Alert and oriented x 3 PSYCHIATRIC:  Normal affect   ASSESSMENT:    No diagnosis found. PLAN:    In order of problems listed above:  ***      {Are you ordering a CV Procedure (e.g. stress test, cath, DCCV, TEE, etc)?   Press F2        :[id number]}    Medication Adjustments/Labs and Tests Ordered: Current medicines are reviewed at length with the patient today.   Concerns regarding medicines are outlined above.  No orders of the defined types were placed in this encounter.  No orders of the defined types were placed in this encounter.   There are no Patient Instructions on file for this visit.   Signed, Cline Crock, PA-C  07/17/2021 9:39 AM    [facility]

## 2021-07-23 ENCOUNTER — Ambulatory Visit (INDEPENDENT_AMBULATORY_CARE_PROVIDER_SITE_OTHER): Payer: 59 | Admitting: Physician Assistant

## 2021-07-23 ENCOUNTER — Other Ambulatory Visit: Payer: Self-pay

## 2021-07-23 ENCOUNTER — Encounter: Payer: Self-pay | Admitting: Physician Assistant

## 2021-07-23 ENCOUNTER — Ambulatory Visit: Payer: 59

## 2021-07-23 ENCOUNTER — Encounter: Payer: Self-pay | Admitting: *Deleted

## 2021-07-23 VITALS — BP 100/56 | HR 67 | Ht 70.0 in | Wt 246.8 lb

## 2021-07-23 DIAGNOSIS — R42 Dizziness and giddiness: Secondary | ICD-10-CM | POA: Diagnosis not present

## 2021-07-23 DIAGNOSIS — R079 Chest pain, unspecified: Secondary | ICD-10-CM | POA: Diagnosis not present

## 2021-07-23 DIAGNOSIS — E785 Hyperlipidemia, unspecified: Secondary | ICD-10-CM | POA: Diagnosis not present

## 2021-07-23 DIAGNOSIS — I10 Essential (primary) hypertension: Secondary | ICD-10-CM | POA: Diagnosis not present

## 2021-07-23 MED ORDER — METOPROLOL TARTRATE 50 MG PO TABS: 50.0000 mg | ORAL_TABLET | Freq: Once | ORAL | 0 refills | Status: DC

## 2021-07-23 NOTE — Patient Instructions (Addendum)
Medication Instructions:  ? ?Your physician recommends that you continue on your current medications as directed. Please refer to the Current Medication list given to you today. ? ?MAKE SURE YOU TAKE METOPROLOL 50 MG TWO HOURS PRIOR TO SCHEDULED PROCEDURE  ? ?*If you need a refill on your cardiac medications before your next appointment, please call your pharmacy* ? ? ?Lab Work: BMET CBC AND LIPIDS TODAY  ? ?If you have labs (blood work) drawn today and your tests are completely normal, you will receive your results only by: ?MyChart Message (if you have MyChart) OR ?A paper copy in the mail ?If you have any lab test that is abnormal or we need to change your treatment, we will call you to review the results. ? ? ?Testing/Procedures: Your physician has requested that you have an echocardiogram. Echocardiography is a painless test that uses sound waves to create images of your heart. It provides your doctor with information about the size and shape of your heart and how well your heart?s chambers and valves are working. This procedure takes approximately one hour. There are no restrictions for this procedure. ? ? ?Non-Cardiac CT Angiography (CTA), is a special type of CT scan that uses a computer to produce multi-dimensional views of major blood vessels throughout the body. In CT angiography, a contrast material is injected through an IV to help visualize the blood vessels  ? ? ?Your physician has recommended that you wear an event monitor. Event monitors are medical devices that record the heart?s electrical activity. Doctors most often [place] these monitors to diagnose arrhythmias. Arrhythmias are problems with the speed or rhythm of the heartbeat. The monitor is a small, portable device. You can wear one while you do your normal daily activities. This is usually used to diagnose what is causing palpitations/syncope (passing out). ? ? ?Follow-Up: ?At [facility], you and your health needs are our priority.  As part  of our continuing mission to provide you with exceptional heart care, we have created designated Provider Care Teams.  These Care Teams include your primary Cardiologist (physician) and Advanced Practice Providers (APPs -  Physician Assistants and Nurse Practitioners) who all work together to provide you with the care you need, when you need it. ? ?We recommend signing up for the patient portal called "MyChart".  Sign up information is provided on this After Visit Summary.  MyChart is used to connect with patients for Virtual Visits (Telemedicine).  Patients are able to view lab/test results, encounter notes, upcoming appointments, etc.  Non-urgent messages can be sent to your provider as well.   ?To learn more about what you can do with MyChart, go to [email or web address].   ? ?Your next appointment:  BASED UPON RESULTS OF CT AND ECHOCARDIOGRAM  ? ? ?The format for your next appointment:   ?In Person ? ?Provider:   ?Carlean Jews, PA-C  ? ? ?Other Instructions ? ?ZIO XT- Long Term Monitor Instructions ? ?Your physician has requested you wear a ZIO patch monitor for 14 days.  ?This is a single patch monitor. [employer] supplies one patch monitor per enrollment. Additional ?stickers are not available. Please do not apply patch if you will be having a Nuclear Stress Test,  ?Echocardiogram, Cardiac CT, MRI, or Chest Xray during the period you would be wearing the  ?monitor. The patch cannot be worn during these tests. You cannot remove and re-apply the  ?ZIO XT patch monitor.  ?Your ZIO patch monitor will be mailed 3 day USPS to  your address on file. It may take 3-5 days  ?to receive your monitor after you have been enrolled.  ?Once you have received your monitor, please review the enclosed instructions. Your monitor  ?has already been registered assigning a specific monitor serial # to you. ? ?Billing and Patient Assistance Program Information ? ?We have supplied [employer] with any of your insurance information on file  for billing purposes. ?[employer] offers a sliding scale Patient Assistance Program for patients that do not have  ?insurance, or whose insurance does not completely cover the cost of the ZIO monitor.  ?You must apply for the Patient Assistance Program to qualify for this discounted rate.  ?To apply, please call [employer] at [phone], select option 4, select option 2, ask to apply for  ?Patient Assistance Program. [employer]eth [employer] will ask your household income, and how many people  ?are in your household. They will quote your out-of-pocket cost based on that information.  ?[employer] will also be able to set up a 1158-month, interest-free payment plan if needed. ? ?Applying the monitor ?  ?Shave hair from upper left chest.  ?Hold abrader disc by orange tab. Rub abrader in 40 strokes over the upper left chest as  ?indicated in your monitor instructions.  ?Clean area with 4 enclosed alcohol pads. Let dry.  ?Apply patch as indicated in monitor instructions. Patch will be placed under collarbone on left  ?side of chest with arrow pointing upward.  ?Rub patch adhesive wings for 2 minutes. Remove white label marked "1". Remove the white  ?label marked "2". Rub patch adhesive wings for 2 additional minutes.  ?While looking in a mirror, press and release button in center of patch. A small green light will  ?flash 3-4 times. This will be your only indicator that the monitor has been turned on.  ?Do not shower for the first 24 hours. You may shower after the first 24 hours.  ?Press the button if you feel a symptom. You will hear a small click. Record Date, Time and  ?Symptom in the Patient Logbook.  ?When you are ready to remove the patch, follow instructions on the last 2 pages of Patient  ?Logbook. Stick patch monitor onto the last page of Patient Logbook.  ?Place Patient Logbook in the blue and white box. Use locking tab on box and tape box closed  ?securely. The blue and white box has prepaid postage on it. Please place it in the  mailbox as  ?soon as possible. Your physician should have your test results approximately 7 days after the  ?monitor has been [facility] to [facility].  ?Call [facility] Customer Care at [phone] if you have questions regarding  ?your ZIO XT patch monitor. Call them immediately if you see an orange light blinking on your  ?monitor.  ?If your monitor falls off in less than 4 days, contact our Monitor department at [phone].  ?If your monitor becomes loose or falls off after 4 days call [employer] at [phone] for  ?suggestions on securing your monitor ? ? ? ? ? ?Your cardiac CT will be scheduled at one of the below locations:  ? ?[facility] ?[address] ?[place], [place] [address] ?[phone] ? ?OR ? ?[facility] ?[address] ?[address] ?[place], [place] [address] ?[phone] ? ?If scheduled at [facility], please arrive at the [facility] (Entrance C2) of [facility] 30 minutes prior to test start time. ?You can use the  FREE valet parking offered at entrance C (encouraged to control the heart rate for the test)  ?Proceed to the [facility] Radiology Department (first floor) to check-in and test prep. ? ?All radiology patients and guests should use entrance C2 at [facility], accessed from [facility], even though the hospital's physical address listed is [address] ? ? ? ?If scheduled at [facility], please arrive 15 mins early for check-in and test prep. ? ?Please follow these instructions carefully (unless otherwise directed): ? ?Hold all erectile dysfunction medications at least 3 days (72 hrs) prior to test. ? ?On the Night Before the Test: ?Be sure to Drink plenty of water. ?Do not consume any caffeinated/decaffeinated beverages or chocolate 12 hours prior to your test. ?Do not take any antihistamines 12 hours prior to your test. ? ? ? ?On  the Day of the Test: ?Drink plenty of water until 1 hour prior to the test. ?Do not eat any food 4 hours prior to the test. ?You may take your regular medications prior to the test.  ?Take metoprolol (Lopr

## 2021-07-23 NOTE — Progress Notes (Unsigned)
Enrolled patient for a 14 Day Zio XT monitor to be mailed to patients home 

## 2021-07-24 ENCOUNTER — Telehealth (HOSPITAL_COMMUNITY): Payer: 59 | Admitting: Psychiatry

## 2021-07-24 LAB — LIPID PANEL
Chol/HDL Ratio: 4.5 ratio (ref 0.0–5.0)
Cholesterol, Total: 161 mg/dL (ref 100–199)
HDL: 36 mg/dL — ABNORMAL LOW (ref >39)
LDL Chol Calc (NIH): 84 mg/dL (ref 0–99)
Triglycerides: 245 mg/dL — ABNORMAL HIGH (ref 0–149)
VLDL Cholesterol Cal: 41 mg/dL — ABNORMAL HIGH (ref 5–40)

## 2021-07-24 LAB — BASIC METABOLIC PANEL
BUN/Creatinine Ratio: 15 (ref 9–20)
BUN: 19 mg/dL (ref 6–24)
CO2: 24 mmol/L (ref 20–29)
Calcium: 10.2 mg/dL (ref 8.7–10.2)
Chloride: 95 mmol/L — ABNORMAL LOW (ref 96–106)
Creatinine, Ser: 1.29 mg/dL — ABNORMAL HIGH (ref 0.76–1.27)
Glucose: 142 mg/dL — ABNORMAL HIGH (ref 70–99)
Potassium: 5 mmol/L (ref 3.5–5.2)
Sodium: 138 mmol/L (ref 134–144)
eGFR: 65 mL/min/{1.73_m2} (ref >59)

## 2021-07-24 LAB — CBC
Hematocrit: 52.9 % — ABNORMAL HIGH (ref 37.5–51.0)
Hemoglobin: 17.7 g/dL (ref 13.0–17.7)
MCH: 28.6 pg (ref 26.6–33.0)
MCHC: 33.5 g/dL (ref 31.5–35.7)
MCV: 86 fL (ref 79–97)
Platelets: 300 10*3/uL (ref 150–450)
RBC: 6.19 x10E6/uL — ABNORMAL HIGH (ref 4.14–5.80)
RDW: 13.3 % (ref 11.6–15.4)
WBC: 10 10*3/uL (ref 3.4–10.8)

## 2021-07-29 ENCOUNTER — Other Ambulatory Visit: Payer: Self-pay | Admitting: Physician Assistant

## 2021-07-31 ENCOUNTER — Other Ambulatory Visit: Payer: Self-pay

## 2021-07-31 ENCOUNTER — Ambulatory Visit (HOSPITAL_COMMUNITY): Payer: 59 | Attending: Internal Medicine

## 2021-07-31 DIAGNOSIS — R42 Dizziness and giddiness: Secondary | ICD-10-CM | POA: Diagnosis present

## 2021-07-31 DIAGNOSIS — R079 Chest pain, unspecified: Secondary | ICD-10-CM | POA: Diagnosis present

## 2021-07-31 LAB — ECHOCARDIOGRAM COMPLETE
Area-P 1/2: 2.28 cm2
S' Lateral: 2.6 cm

## 2021-07-31 MED ORDER — PERFLUTREN LIPID MICROSPHERE
3.0000 mL | INTRAVENOUS | Status: AC | PRN
  Administered 2021-07-31: 3 mL via INTRAVENOUS

## 2021-08-04 ENCOUNTER — Telehealth (INDEPENDENT_AMBULATORY_CARE_PROVIDER_SITE_OTHER): Payer: 59 | Admitting: Psychiatry

## 2021-08-04 ENCOUNTER — Telehealth (HOSPITAL_COMMUNITY): Payer: Self-pay | Admitting: *Deleted

## 2021-08-04 DIAGNOSIS — F322 Major depressive disorder, single episode, severe without psychotic features: Secondary | ICD-10-CM

## 2021-08-04 DIAGNOSIS — F411 Generalized anxiety disorder: Secondary | ICD-10-CM | POA: Diagnosis not present

## 2021-08-04 MED ORDER — OXCARBAZEPINE 300 MG PO TABS: 300.0000 mg | ORAL_TABLET | Freq: Two times a day (BID) | ORAL | 3 refills | Status: DC

## 2021-08-04 MED ORDER — PROPRANOLOL HCL 10 MG PO TABS: 10.0000 mg | ORAL_TABLET | Freq: Three times a day (TID) | ORAL | 2 refills | Status: DC

## 2021-08-04 MED ORDER — ARIPIPRAZOLE 15 MG PO TABS: 15.0000 mg | ORAL_TABLET | Freq: Every day | ORAL | 2 refills | Status: DC

## 2021-08-04 MED ORDER — BUPROPION HCL ER (XL) 300 MG PO TB24: 300.0000 mg | ORAL_TABLET | Freq: Every day | ORAL | 3 refills | Status: DC

## 2021-08-04 MED ORDER — DULOXETINE HCL 60 MG PO CPEP: 60.0000 mg | ORAL_CAPSULE | Freq: Every day | ORAL | 3 refills | Status: DC

## 2021-08-04 MED ORDER — DULOXETINE HCL 20 MG PO CPEP: 20.0000 mg | ORAL_CAPSULE | Freq: Every day | ORAL | 2 refills | Status: DC

## 2021-08-04 MED ORDER — BUPROPION HCL ER (XL) 150 MG PO TB24: ORAL_TABLET | ORAL | 3 refills | Status: DC

## 2021-08-04 MED ORDER — HYDROXYZINE HCL 25 MG PO TABS: ORAL_TABLET | ORAL | 2 refills | Status: DC

## 2021-08-04 NOTE — Telephone Encounter (Signed)
Reaching out to patient to offer assistance regarding upcoming cardiac imaging study; pt verbalizes understanding of appt date/time, parking situation and where to check in, pre-test NPO status and medications ordered, and verified current allergies; name and call back number provided for further questions should they arise ? ?Larey Brick RN Navigator Cardiac Imaging ?[facility] ?[phone] office ?[phone] cell ? ?Patient to take 50mg  metoprolol tartrate two hours prior to his cardiac CT scan.  He is aware to arrive at 3:30pm for his 4pm scan. ?

## 2021-08-04 NOTE — Progress Notes (Signed)
BH MD/PA/NP OP Progress Note ? ?08/04/2021 3:01 PM ?[name] [name]  ?MRN:  [id number] ? ?Virtual Visit via Video Note ? ?I connected with [name] [name] on 08/04/21 at 11:30 AM EDT by a video enabled telemedicine application and verified that I am speaking with the correct person using two identifiers. ? ?Location: ?Patient: home ?Provider: off site ?  ?I discussed the limitations of evaluation and management by telemedicine and the availability of in person appointments. The patient expressed understanding and agreed to proceed. ? ?  ?I discussed the assessment and treatment plan with the patient. The patient was provided an opportunity to ask questions and all were answered. The patient agreed with the plan and demonstrated an understanding of the instructions. ?  ?The patient was advised to call back or seek an in-person evaluation if the symptoms worsen or if the condition fails to improve as anticipated. ? ?I provided 10 minutes of non-face-to-face time during this encounter. ? ? ?Mcneil Sobericely Kora Groom, NP  ? ?Chief Complaint: Medication management ? ?HPI: [name] [name] is a 56 year old male [facility] follow-up psychiatric evaluation.  Patient has a psychiatric history of major depressive disorder, PTSD, and generalized anxiety.  Patient symptoms are managed with Abilify 10 mg daily, Wellbutrin 450 mg daily, Cymbalta 80 mg daily, hydroxyzine 25 mg 3 times daily as needed for anxiety, Trileptal 300 mg twice daily, and propanolol 10 mg 3 times daily.  Patient is noted in the dark, appears to be lying in bed.  He reports feeling down and secluding himself not wanting to go to the gym as he used to enjoy and feeling depressed for approximately 4 months now.  Patient reports suicidal thoughts 2 weeks ago but had no plan.  He denies suicidal ideations today.  Patient also reports increased irritability.  He is open to increasing Abilify to 15 mg daily for better symptom  management.  Medication benefits versus risks discussed. ?Patient is alert and oriented x4, calm, pleasant and willing to engage.  He denies suicidal or homicidal ideations, paranoia, delusional thought, auditory or visual hallucinations. ? ?Visit Diagnosis:  ?  ICD-10-CM   ?1. Generalized anxiety disorder  F41.1 hydrOXYzine (ATARAX) 25 MG tablet  ?  propranolol (INDERAL) 10 MG tablet  ?  ?2. Severe depression (HCC)  F32.2 ARIPiprazole (ABILIFY) 15 MG tablet  ?  buPROPion (WELLBUTRIN XL) 150 MG 24 hr tablet  ?  buPROPion (WELLBUTRIN XL) 300 MG 24 hr tablet  ?  Oxcarbazepine (TRILEPTAL) 300 MG tablet  ?  ? ? ?Past Psychiatric History:  ? ? ?Past Medical History:  ?Past Medical History:  ?Diagnosis Date  ? Anxiety   ? Chest heaviness   ? Chills   ? Depression, major   ? Diabetes mellitus without complication (HCC)   ? Dizzy   ? Dyslipidemia   ? ED (erectile dysfunction)   ? Elevated CPK   ? HA (headache)   ? Hypertension   ? Hypertriglyceridemia   ? Insomnia   ? Kidney stone   ? Left-sided Bell's palsy   ? Morbid obesity (HCC)   ? Nausea   ? No appetite   ? Panniculitis   ? of right lower leg  ? PTSD (post-traumatic stress disorder)   ? Weakness   ?  ?Past Surgical History:  ?Procedure Laterality Date  ? NO PAST SURGERIES    ? ? ?Family Psychiatric History: see below ? ?Family History:  ?Family History  ?Problem Relation Age of  Onset  ? Thyroid disease Brother   ? Depression Brother   ? Heart disease Mother   ? Kidney failure Father   ? Diabetes Father   ? Thyroid disease Sister   ? Mental illness Sister   ? Heart attack Maternal Grandfather   ? Heart failure Paternal Grandmother   ? Cancer Paternal Grandfather   ?     bone  ? ? ?Social History:  ?Social History  ? ?Socioeconomic History  ? Marital status: Single  ?  Spouse name: Not on file  ? Number of children: 0  ? Years of education: 87  ? Highest education level: Not on file  ?Occupational History  ? Not on file  ?Tobacco Use  ? Smoking status: Never  ?  Smokeless tobacco: Never  ?Vaping Use  ? Vaping Use: Never used  ?Substance and Sexual Activity  ? Alcohol use: No  ? Drug use: No  ? Sexual activity: Not on file  ?Other Topics Concern  ? Not on file  ?Social History Narrative  ? Lives with brother in a one story home.  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   ? ?Social Determinants of Health  ? ?Financial Resource Strain: Not on file  ?Food Insecurity: Not on file  ?Transportation Needs: Not on file  ?Physical Activity: Not on file  ?Stress: Not on file  ?Social Connections: Not on file  ? ? ?Allergies: No Known Allergies ? ?Metabolic Disorder Labs: ?No results found for: HGBA1C, MPG ?No results found for: PROLACTIN ?Lab Results  ?Component Value Date  ? CHOL 161 07/23/2021  ? TRIG 245 (H) 07/23/2021  ? HDL 36 (L) 07/23/2021  ? CHOLHDL 4.5 07/23/2021  ? LDLCALC 84 07/23/2021  ? ?No results found for: TSH ? ?Therapeutic Level Labs: ?No results found for: LITHIUM ?No results found for: VALPROATE ?No components found for:  CBMZ ? ?Current Medications: ?Current Outpatient Medications  ?Medication Sig Dispense Refill  ? ALPRAZolam (XANAX) 0.5 MG tablet Take 0.5 mg by mouth 3 (three) times daily as needed for anxiety.    ? ARIPiprazole (ABILIFY) 15 MG tablet Take 1 tablet (15 mg total) by mouth daily. 30 tablet 2  ? aspirin EC 81 MG tablet Take 81 mg by mouth daily.    ? atorvastatin (LIPITOR) 20 MG tablet Take 20 mg by mouth daily.    ? buPROPion (WELLBUTRIN XL) 150 MG 24 hr tablet TAKE 1 TABLET BY MOUTH EVERY MORNING WITH 300MG  MG (450MG  TOTAL) 30 tablet 3  ? buPROPion (WELLBUTRIN XL) 300 MG 24 hr tablet Take 1 tablet (300 mg total) by mouth daily. 30 tablet 3  ? cholecalciferol (VITAMIN D) 1000 units tablet Take 1,000 Units by mouth daily.    ? CINNAMON PO Take 1 capsule by mouth daily.    ? Cyanocobalamin (VITAMIN B-12 PO) Take 1 tablet by mouth daily.    ? DULoxetine (CYMBALTA) 20 MG capsule Take 1 capsule (20 mg total) by mouth daily. 30  capsule 2  ? DULoxetine (CYMBALTA) 60 MG capsule Take 1 capsule (60 mg total) by mouth daily. 30 capsule 3  ? glimepiride (AMARYL) 4 MG tablet Take 4 mg by mouth 2 (two) times daily.    ? hydrOXYzine (ATARAX) 25 MG tablet TAKE 1 TABLET BY MOUTH FOUR TIMES A DAY & TAKE 2 TABLETS EVERY NIGHT AT BEDTIME FOR ANXIETY, PANIC, AGITATION, AND SLEEP 180 tablet 2  ? lisinopril (PRINIVIL,ZESTRIL) 10 MG tablet Take 10 mg by mouth daily.    ?  metFORMIN (GLUCOPHAGE) 500 MG tablet Take 500 mg by mouth 2 (two) times daily.    ? metoprolol tartrate (LOPRESSOR) 50 MG tablet TAKE 1 TABLET BY MOUTH 2 HOURS PRIOR TO SCHEDULED PROCEDURE 1 tablet 0  ? Multiple Vitamin (MULTIVITAMIN) tablet Take 1 tablet by mouth daily.    ? Omega-3 1000 MG CAPS Take 1,000 mg by mouth daily.    ? omeprazole (PRILOSEC) 20 MG capsule Take 20 mg by mouth daily.    ? Oxcarbazepine (TRILEPTAL) 300 MG tablet Take 1 tablet (300 mg total) by mouth 2 (two) times daily. 60 tablet 3  ? pantoprazole (PROTONIX) 20 MG tablet Take 1 tablet (20 mg total) by mouth daily. 30 tablet 0  ? promethazine (PHENERGAN) 25 MG tablet Take 25 mg by mouth every 6 (six) hours as needed for nausea or vomiting.    ? propranolol (INDERAL) 10 MG tablet Take 1 tablet (10 mg total) by mouth 3 (three) times daily. 90 tablet 2  ? sildenafil (REVATIO) 20 MG tablet Take 20 mg by mouth 3 (three) times daily.    ? sucralfate (CARAFATE) 1 GM/10ML suspension Take 10 mLs (1 g total) by mouth 4 (four) times daily -  with meals and at bedtime. 420 mL 0  ? ?No current facility-administered medications for this visit.  ? ? ? ?Musculoskeletal: ?Strength & Muscle Tone:  n/a virtual visit ?Gait & Station:  n/a ?Patient leans:  n/a ? ?Psychiatric Specialty Exam: ?Review of Systems  ?Psychiatric/Behavioral:  Positive for dysphoric mood. Negative for hallucinations, self-injury and suicidal ideas.   ?All other systems reviewed and are negative.  ?There were no vitals taken for this visit.There is no height or  weight on file to calculate BMI.  ?General Appearance: Fairly Groomed  ?Eye Contact:  Good  ?Speech:  Clear and Coherent  ?Volume:  Normal  ?Mood:  Depressed  ?Affect:  Congruent  ?Thought Process:  Goal Directed  ?Pleas Patricia

## 2021-08-04 NOTE — Telephone Encounter (Signed)
Attempted to call patient regarding upcoming cardiac CT appointment. °Left message on voicemail with name and callback number ° °Anael Rosch RN Navigator Cardiac Imaging °[facility] °[phone] Office °[phone] Cell ° °

## 2021-08-04 NOTE — Progress Notes (Signed)
Hello [name] [name] ?

## 2021-08-05 ENCOUNTER — Ambulatory Visit (HOSPITAL_COMMUNITY)
Admission: RE | Admit: 2021-08-05 | Discharge: 2021-08-05 | Disposition: A | Discharge status: Home / Self Care | Payer: 59 | Source: Ambulatory Visit | Attending: Physician Assistant | Admitting: Physician Assistant

## 2021-08-05 DIAGNOSIS — R42 Dizziness and giddiness: Secondary | ICD-10-CM | POA: Diagnosis present

## 2021-08-05 DIAGNOSIS — R079 Chest pain, unspecified: Secondary | ICD-10-CM | POA: Diagnosis not present

## 2021-08-05 MED ORDER — NITROGLYCERIN 0.4 MG SL SUBL
SUBLINGUAL_TABLET | SUBLINGUAL | Status: AC
  Filled 2021-08-05: qty 2

## 2021-08-05 MED ORDER — IOHEXOL 350 MG/ML SOLN
95.0000 mL | Freq: Once | INTRAVENOUS | Status: AC | PRN
  Administered 2021-08-05: 95 mL via INTRAVENOUS

## 2021-08-05 MED ORDER — NITROGLYCERIN 0.4 MG SL SUBL
0.8000 mg | SUBLINGUAL_TABLET | Freq: Once | SUBLINGUAL | Status: AC
Start: 2021-08-05 — End: 2021-08-05
  Administered 2021-08-05: 0.8 mg via SUBLINGUAL

## 2021-08-11 MED ORDER — LISINOPRIL 20 MG PO TABS: 20.0000 mg | ORAL_TABLET | Freq: Every day | ORAL | 3 refills | Status: DC

## 2021-08-11 NOTE — Telephone Encounter (Signed)
I spoke with the pt and he agrees to increase his Lisinopril and keep an eye on his BP and he verbalized understanding of his results. Marland Kitchen  ?

## 2021-08-11 NOTE — Telephone Encounter (Signed)
Pt returning call regarding CT results ? ?

## 2021-09-21 ENCOUNTER — Other Ambulatory Visit (HOSPITAL_COMMUNITY): Payer: Self-pay | Admitting: Psychiatry

## 2021-09-21 DIAGNOSIS — F322 Major depressive disorder, single episode, severe without psychotic features: Secondary | ICD-10-CM

## 2021-09-28 ENCOUNTER — Other Ambulatory Visit: Payer: Self-pay | Admitting: Internal Medicine

## 2021-09-28 ENCOUNTER — Encounter: Payer: Self-pay | Admitting: Neurology

## 2021-09-28 ENCOUNTER — Other Ambulatory Visit: Payer: 59

## 2021-09-28 DIAGNOSIS — R42 Dizziness and giddiness: Secondary | ICD-10-CM

## 2021-09-28 DIAGNOSIS — S0990XD Unspecified injury of head, subsequent encounter: Secondary | ICD-10-CM

## 2021-09-29 ENCOUNTER — Ambulatory Visit: Payer: 59 | Admitting: Physical Therapy

## 2021-09-29 DIAGNOSIS — R42 Dizziness and giddiness: Secondary | ICD-10-CM

## 2021-09-29 NOTE — Therapy (Signed)
[facility] [address] [place], [place], [address] Phone: [phone]   Fax:  [phone]  Patient Details  Name: [name] [name] MRN: [id number] Date of Birth: 1965-07-01 Referring Provider:  Collene Mares, [place]  Encounter Date: 09/29/2021  Arrived - No Charge   Pt arrived to PT eval and PT let pt know that his insurance did not cover PT services (pt was unaware of this information and was not given a good faith estimate of cost of services). Pt reporting that he would not be able to pay for services if his insurance did not cover it. The front desk was going to double check with his insurance to see if services would be covered (pt has [employer]). If PT services are covered, then pt would re-schedule his evaluation.   Drake Leach, PT, DPT 09/29/2021, 10:29 AM  [facility] [address] [place], [place], [address] Phone: [phone]   Fax:  [phone]

## 2021-10-02 ENCOUNTER — Other Ambulatory Visit: Payer: 59

## 2021-10-08 ENCOUNTER — Ambulatory Visit: Payer: 59 | Attending: Internal Medicine | Admitting: Physical Therapy

## 2021-10-19 ENCOUNTER — Other Ambulatory Visit (HOSPITAL_COMMUNITY): Payer: Self-pay | Admitting: Psychiatry

## 2021-10-19 DIAGNOSIS — F322 Major depressive disorder, single episode, severe without psychotic features: Secondary | ICD-10-CM

## 2021-10-21 ENCOUNTER — Encounter (HOSPITAL_COMMUNITY): Payer: Self-pay | Admitting: Psychiatry

## 2021-10-21 ENCOUNTER — Telehealth (INDEPENDENT_AMBULATORY_CARE_PROVIDER_SITE_OTHER): Payer: 59 | Admitting: Psychiatry

## 2021-10-21 DIAGNOSIS — F3130 Bipolar disorder, current episode depressed, mild or moderate severity, unspecified: Secondary | ICD-10-CM | POA: Insufficient documentation

## 2021-10-21 DIAGNOSIS — F411 Generalized anxiety disorder: Secondary | ICD-10-CM

## 2021-10-21 MED ORDER — OXCARBAZEPINE 300 MG PO TABS: 300.0000 mg | ORAL_TABLET | Freq: Three times a day (TID) | ORAL | 3 refills | Status: DC

## 2021-10-21 MED ORDER — BUPROPION HCL ER (XL) 300 MG PO TB24: 300.0000 mg | ORAL_TABLET | Freq: Every day | ORAL | 3 refills | Status: DC

## 2021-10-21 MED ORDER — DULOXETINE HCL 60 MG PO CPEP: 60.0000 mg | ORAL_CAPSULE | Freq: Every day | ORAL | 3 refills | Status: DC

## 2021-10-21 MED ORDER — PROPRANOLOL HCL 10 MG PO TABS: 10.0000 mg | ORAL_TABLET | Freq: Three times a day (TID) | ORAL | 3 refills | Status: DC

## 2021-10-21 MED ORDER — ARIPIPRAZOLE 20 MG PO TABS: 20.0000 mg | ORAL_TABLET | Freq: Every day | ORAL | 3 refills | Status: DC

## 2021-10-21 MED ORDER — BUPROPION HCL ER (XL) 150 MG PO TB24: ORAL_TABLET | ORAL | 3 refills | Status: DC

## 2021-10-21 MED ORDER — HYDROXYZINE HCL 25 MG PO TABS: ORAL_TABLET | ORAL | 2 refills | Status: DC

## 2021-10-21 MED ORDER — DULOXETINE HCL 20 MG PO CPEP: 20.0000 mg | ORAL_CAPSULE | Freq: Every day | ORAL | 2 refills | Status: DC

## 2021-10-21 NOTE — Progress Notes (Signed)
BH MD/PA/NP OP Progress Note Virtual Visit via Telephone Note  I connected with [name] [name] on 10/21/21 at 11:30 AM EDT by telephone and verified that I am speaking with the correct person using two identifiers.  Location: Patient: home Provider: Clinic   I discussed the limitations, risks, security and privacy concerns of performing an evaluation and management service by telephone and the availability of in person appointments. I also discussed with the patient that there may be a patient responsible charge related to this service. The patient expressed understanding and agreed to proceed.   I provided 30 minutes of non-face-to-face time during this encounter.   10/21/2021 11:59 AM [name] [name]  MRN:  [id number]  Chief Complaint: "'I have been feeling more depressed for the last mouth".   HPI:  56 year old male seen today for follow up psychiatric evaluation. He has a history of anxiety, depression and PTSD. He is currently managed onTrileptal 300 mg BID, duloxetine 80 mg daily, buproprion 450 mg once daily, Abilify 15 mg once daily, propanolol 10 mg three times, and Xanax 0.5 mg TID (prescribed by his PCP). Patient reports his medications are somewhat effective in managing his psychiatric conditions.  Patient unable to log on virtually so the assessment was done over the phone.  During the exam he is pleasant, cooperative, and engaged in conversation.  Patient notes that he has been experiencing a state of depression for the last month.  Patient informed Probation officer that his mood fluctuates.  He informed Probation officer that he is irritable, distractible, and has racing thoughts.  Due to his depression he notes that he has been out of work for the last 3 days.  Patient informed writer that in the past there where he had euphoric states.  He notes that this has not happened in a while and reports that he continues to be in the slumps.  Provider conducted a GAD-7 and patient scored a 20.  Provider  also conducted PHQ-9 and patient scored 24.  He endorses hypersomnia noting that he sleeps 15 or more hours daily.  He reports that his appetite is reduced and notes that he has lost over 40 pounds over the last 5 months.  Today he endorses passive SI but denies wanting to harm himself.  Today he denies SI/HI/VAH or paranoia.   Patient agreeable to increasing Trileptal 300 mg twice daily to 300 mg 3 times daily to help stabilize mood.  Abilify was increased to 20 mg to help manage mood and depression.  He will continue other medications as prescribed.  Provider informed patient that hydroxyzine is sedating and recommended that he takes it as needed instead of scheduled as he has been doing.  He endorsed understanding and agreed.  At next visit if patient's mood does not improve provider recommended dose reductions of propanolol as it could be contributing to increase sedation.  Patient received Xanax from his primary care doctor and provider recommended that this be reduced as well educated regarding breakthrough anxiety.  No other concerns at this time. Visit Diagnosis:    ICD-10-CM   1. Bipolar I disorder, most recent episode depressed (HCC)  F31.30 ARIPiprazole (ABILIFY) 20 MG tablet    buPROPion (WELLBUTRIN XL) 150 MG 24 hr tablet    buPROPion (WELLBUTRIN XL) 300 MG 24 hr tablet    Oxcarbazepine (TRILEPTAL) 300 MG tablet    DULoxetine (CYMBALTA) 60 MG capsule    DULoxetine (CYMBALTA) 20 MG capsule    2. Generalized anxiety disorder  F41.1 hydrOXYzine (ATARAX) 25 MG tablet    DULoxetine (CYMBALTA) 60 MG capsule    DULoxetine (CYMBALTA) 20 MG capsule    propranolol (INDERAL) 10 MG tablet        Past Psychiatric History: anxiety, depression and PTSD  Past Medical History:  Past Medical History:  Diagnosis Date   Anxiety    Chest heaviness    Chills    Depression, major    Diabetes mellitus without complication ([place])    Dizzy    Dyslipidemia    ED (erectile dysfunction)    Elevated  CPK    HA (headache)    Hypertension    Hypertriglyceridemia    Insomnia    Kidney stone    Left-sided Bell's palsy    Morbid obesity (HCC)    Nausea    No appetite    Panniculitis    of right lower leg   PTSD (post-traumatic stress disorder)    Weakness     Past Surgical History:  Procedure Laterality Date   NO PAST SURGERIES      Family Psychiatric History: Sister schizophrenia and bipolar disorder  Family History:  Family History  Problem Relation Age of Onset   Thyroid disease Brother    Depression Brother    Heart disease Mother    Kidney failure Father    Diabetes Father    Thyroid disease Sister    Mental illness Sister    Heart attack Maternal Grandfather    Heart failure Paternal Grandmother    Cancer Paternal Grandfather        bone    Social History:  Social History   Socioeconomic History   Marital status: Single    Spouse name: Not on file   Number of children: 0   Years of education: 12   Highest education level: Not on file  Occupational History   Not on file  Tobacco Use   Smoking status: Never   Smokeless tobacco: Never  Vaping Use   Vaping Use: Never used  Substance and Sexual Activity   Alcohol use: No   Drug use: No   Sexual activity: Not on file  Other Topics Concern   Not on file  Social History Narrative   Lives with brother in a one story home.  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    Social Determinants of Health   Financial Resource Strain: Not on file  Food Insecurity: Not on file  Transportation Needs: Not on file  Physical Activity: Not on file  Stress: Not on file  Social Connections: Not on file    Allergies: No Known Allergies  Metabolic Disorder Labs: No results found for: "HGBA1C", "MPG" No results found for: "PROLACTIN" Lab Results  Component Value Date   CHOL 161 07/23/2021   TRIG 245 (H) 07/23/2021   HDL 36 (L) 07/23/2021   CHOLHDL 4.5 07/23/2021   [place] 84 07/23/2021    No results found for: "TSH"  Therapeutic Level Labs: No results found for: "LITHIUM" No results found for: "VALPROATE" No results found for: "CBMZ"  Current Medications: Current Outpatient Medications  Medication Sig Dispense Refill   ALPRAZolam (XANAX) 0.5 MG tablet Take 0.5 mg by mouth 3 (three) times daily as needed for anxiety.     ARIPiprazole (ABILIFY) 20 MG tablet Take 1 tablet (20 mg total) by mouth daily. 30 tablet 3   aspirin EC 81 MG tablet Take 81 mg by mouth daily.     atorvastatin (LIPITOR)  20 MG tablet Take 20 mg by mouth daily.     buPROPion (WELLBUTRIN XL) 150 MG 24 hr tablet TAKE 1 TABLET BY MOUTH EVERY MORNING WITH 300MG  MG (450MG  TOTAL) 30 tablet 3   buPROPion (WELLBUTRIN XL) 300 MG 24 hr tablet Take 1 tablet (300 mg total) by mouth daily. 30 tablet 3   cholecalciferol (VITAMIN D) 1000 units tablet Take 1,000 Units by mouth daily.     CINNAMON PO Take 1 capsule by mouth daily.     Cyanocobalamin (VITAMIN B-12 PO) Take 1 tablet by mouth daily.     DULoxetine (CYMBALTA) 20 MG capsule Take 1 capsule (20 mg total) by mouth daily. 30 capsule 2   DULoxetine (CYMBALTA) 60 MG capsule Take 1 capsule (60 mg total) by mouth daily. 30 capsule 3   glimepiride (AMARYL) 4 MG tablet Take 4 mg by mouth 2 (two) times daily.     hydrOXYzine (ATARAX) 25 MG tablet TAKE 1 TABLET BY MOUTH FOUR TIMES A DAY & TAKE 2 TABLETS EVERY NIGHT AT BEDTIME FOR ANXIETY, PANIC, AGITATION, AND SLEEP 180 tablet 2   lisinopril (ZESTRIL) 20 MG tablet Take 1 tablet (20 mg total) by mouth daily. 90 tablet 3   metFORMIN (GLUCOPHAGE) 500 MG tablet Take 500 mg by mouth 2 (two) times daily.     metoprolol tartrate (LOPRESSOR) 50 MG tablet TAKE 1 TABLET BY MOUTH 2 HOURS PRIOR TO SCHEDULED PROCEDURE 1 tablet 0   Multiple Vitamin (MULTIVITAMIN) tablet Take 1 tablet by mouth daily.     Omega-3 1000 MG CAPS Take 1,000 mg by mouth daily.     omeprazole (PRILOSEC) 20 MG capsule Take 20 mg by mouth daily.      Oxcarbazepine (TRILEPTAL) 300 MG tablet Take 1 tablet (300 mg total) by mouth 3 (three) times daily. 90 tablet 3   pantoprazole (PROTONIX) 20 MG tablet Take 1 tablet (20 mg total) by mouth daily. 30 tablet 0   promethazine (PHENERGAN) 25 MG tablet Take 25 mg by mouth every 6 (six) hours as needed for nausea or vomiting.     propranolol (INDERAL) 10 MG tablet Take 1 tablet (10 mg total) by mouth 3 (three) times daily. 90 tablet 3   sildenafil (REVATIO) 20 MG tablet Take 20 mg by mouth 3 (three) times daily.     sucralfate (CARAFATE) 1 GM/10ML suspension Take 10 mLs (1 g total) by mouth 4 (four) times daily -  with meals and at bedtime. 420 mL 0   No current facility-administered medications for this visit.     Musculoskeletal: Strength & Muscle Tone:  Unable to assess due to telephone visit Gait & Station:  Unable to assess due to telehealth visit Patient leans: N/A  Psychiatric Specialty Exam: Review of Systems  There were no vitals taken for this visit.There is no height or weight on file to calculate BMI.  General Appearance:  Unable to assess due to telephone visit  Eye Contact:   Unable to assess due to telephone visit  Speech:  Clear and Coherent and Normal Rate  Volume:  Normal  Mood:  Anxious and Depressed  Affect:  Congruent  Thought Process:  Coherent, Goal Directed and Linear  Orientation:  Full (Time, Place, and Person)  Thought Content: WDL and Logical   Suicidal Thoughts:  Yes.  without intent/plan  Homicidal Thoughts:  No  Memory:  Immediate;   Good Recent;   Good Remote;   Good  Judgement:  Good  Insight:  Good  Psychomotor  Activity:   Unable to assess due to telephone visit  Concentration:  Concentration: Good and Attention Span: Good  Recall:  Good  Fund of Knowledge: Good  Language: Good  Akathisia:   Unable to assess due to telephone visit  Handed:  Right  AIMS (if indicated): Not done  Assets:  Communication Skills Desire for Improvement Financial  Resources/Insurance Housing Social Support  ADL's:  Intact  Cognition: WNL  Sleep:  Fair   Screenings: GAD-7    Flowsheet Row Video Visit from 10/21/2021 in [facility] Video Visit from 05/15/2021 in [facility] Video Visit from 04/22/2020 in [facility]  Total GAD-7 Score 20 20 13       PHQ2-9    Flowsheet Row Video Visit from 10/21/2021 in [facility] Video Visit from 05[facility]15/2021 in [facility] Video Visit from 04/22/2020 in [facility]  PHQ-2 Total Score 6 6 5   PHQ-9 Total Score 24 23 19       Flowsheet Row Video Visit from 10/21/2021 in [facility] Video Visit from 05/15/2021 in [facility] Error: Q7 should not be populated when Q6 is No Error: Q7 should not be populated when Q6 is No        Assessment and Plan: Patient endorses symptoms of anxiety, depression, hypomania, and hypersomnia. Patient agreeable to increasing Trileptal 300 mg twice daily to 300 mg 3 times daily to help stabilize mood.  Abilify was increased to 20 mg to help manage mood and depression.  He will continue other medications as prescribed.  Provider informed patient that hydroxyzine is sedating and recommended that he takes it as needed instead of scheduled as he has been doing.  He endorsed understanding and agreed.  At next visit if patient's mood does not improve provider recommended dose reductions of propanolol as it could be contributing to increase sedation.  Patient received Xanax from his primary care doctor and provider recommended that this be reduced as well educated regarding breakthrough anxiety.   1. Generalized anxiety disorder  Continue- hydrOXYzine (ATARAX) 25 MG tablet; TAKE 1 TABLET BY MOUTH FOUR TIMES A DAY & TAKE 2 TABLETS EVERY NIGHT AT BEDTIME  FOR ANXIETY, PANIC, AGITATION, AND SLEEP  Dispense: 180 tablet; Refill: 2 Continue- DULoxetine (CYMBALTA) 60 MG capsule; Take 1 capsule (60 mg total) by mouth daily.  Dispense: 30 capsule; Refill: 3 Continue- DULoxetine (CYMBALTA) 20 MG capsule; Take 1 capsule (20 mg total) by mouth daily.  Dispense: 30 capsule; Refill: 2 Continue- propranolol (INDERAL) 10 MG tablet; Take 1 tablet (10 mg total) by mouth 3 (three) times daily.  Dispense: 90 tablet; Refill: 3  2. Bipolar I disorder, most recent episode depressed (HCC)  Increased- ARIPiprazole (ABILIFY) 20 MG tablet; Take 1 tablet (20 mg total) by mouth daily.  Dispense: 30 tablet; Refill: 3 Continue- buPROPion (WELLBUTRIN XL) 150 MG 24 hr tablet; TAKE 1 TABLET BY MOUTH EVERY MORNING WITH 300MG  MG (450MG  TOTAL)  Dispense: 30 tablet; Refill: 3 Continue- buPROPion (WELLBUTRIN XL) 300 MG 24 hr tablet; Take 1 tablet (300 mg total) by mouth daily.  Dispense: 30 tablet; Refill: 3 Increased- Oxcarbazepine (TRILEPTAL) 300 MG tablet; Take 1 tablet (300 mg total) by mouth 3 (three) times daily.  Dispense: 90 tablet; Refill: 3 Continue- DULoxetine (CYMBALTA) 60 MG capsule; Take 1 capsule (60 mg total) by mouth daily.  Dispense: 30 capsule; Refill:  3 Continue- DULoxetine (CYMBALTA) 20 MG capsule; Take 1 capsule (20 mg total) by mouth daily.  Dispense: 30 capsule; Refill: 2    Follow-up in 3 months

## 2021-12-01 ENCOUNTER — Ambulatory Visit
Admission: RE | Admit: 2021-12-01 | Discharge: 2021-12-01 | Disposition: A | Discharge status: Home / Self Care | Payer: Self-pay | Source: Ambulatory Visit | Attending: Internal Medicine | Admitting: Internal Medicine

## 2021-12-01 ENCOUNTER — Other Ambulatory Visit: Payer: Self-pay | Admitting: Internal Medicine

## 2021-12-01 DIAGNOSIS — S6991XA Unspecified injury of right wrist, hand and finger(s), initial encounter: Secondary | ICD-10-CM

## 2021-12-25 ENCOUNTER — Ambulatory Visit: Payer: 59 | Admitting: Orthopedic Surgery

## 2022-01-21 ENCOUNTER — Telehealth (HOSPITAL_COMMUNITY): Payer: Self-pay | Admitting: Psychiatry

## 2022-01-25 ENCOUNTER — Telehealth (INDEPENDENT_AMBULATORY_CARE_PROVIDER_SITE_OTHER): Payer: Commercial Managed Care - HMO | Admitting: Psychiatry

## 2022-01-25 ENCOUNTER — Encounter (HOSPITAL_COMMUNITY): Payer: Self-pay | Admitting: Psychiatry

## 2022-01-25 DIAGNOSIS — F3130 Bipolar disorder, current episode depressed, mild or moderate severity, unspecified: Secondary | ICD-10-CM

## 2022-01-25 DIAGNOSIS — F411 Generalized anxiety disorder: Secondary | ICD-10-CM

## 2022-01-25 MED ORDER — BUPROPION HCL ER (XL) 300 MG PO TB24: 300.0000 mg | ORAL_TABLET | Freq: Every day | ORAL | 3 refills | Status: DC

## 2022-01-25 MED ORDER — PROPRANOLOL HCL 10 MG PO TABS: 10.0000 mg | ORAL_TABLET | Freq: Three times a day (TID) | ORAL | 3 refills | Status: DC

## 2022-01-25 MED ORDER — BUPROPION HCL ER (XL) 150 MG PO TB24: ORAL_TABLET | ORAL | 3 refills | Status: DC

## 2022-01-25 MED ORDER — DULOXETINE HCL 60 MG PO CPEP: 60.0000 mg | ORAL_CAPSULE | Freq: Two times a day (BID) | ORAL | 3 refills | Status: DC

## 2022-01-25 MED ORDER — OXCARBAZEPINE 300 MG PO TABS: 300.0000 mg | ORAL_TABLET | Freq: Three times a day (TID) | ORAL | 3 refills | Status: DC

## 2022-01-25 MED ORDER — HYDROXYZINE HCL 25 MG PO TABS: ORAL_TABLET | ORAL | 3 refills | Status: DC

## 2022-01-25 MED ORDER — ARIPIPRAZOLE 20 MG PO TABS: 20.0000 mg | ORAL_TABLET | Freq: Every day | ORAL | 3 refills | Status: DC

## 2022-01-25 NOTE — Progress Notes (Signed)
BH MD/PA/NP OP Progress Note Virtual Visit via Video Note  I connected with [name] [name] on 01/25/22 at 11:30 AM EDT by a video enabled telemedicine application and verified that I am speaking with the correct person using two identifiers.  Location: Patient: Home Provider: Clinic   I discussed the limitations of evaluation and management by telemedicine and the availability of in person appointments. The patient expressed understanding and agreed to proceed.  I provided 30 minutes of non-face-to-face time during this encounter.     01/25/2022 11:24 AM [name] [name]  MRN:  [id number]  Chief Complaint: "'I am feeling depressed".   HPI:  56 year old male seen today for follow up psychiatric evaluation. He has a history of anxiety, depression and PTSD. He is currently managed onTrileptal 300 mg BID, duloxetine 80 mg daily, buproprion 450 mg once daily, Abilify 20 mg once daily, propanolol 10 mg three times, and Xanax 0.5 mg TID (prescribed by his PCP). Patient reports his medications are somewhat effective in managing his psychiatric conditions.  Today he was well-groomed, pleasant, cooperative, and engaged in conversation.  He informed Probation officer that he continues to feel depressed but notes that his medication adjustments helped anxiety and depression.  Patient reports that he continues to be concerned about his health, job, and finances.  He noted that he would like to work a full-time job but is having difficulty finding a position.  Today provider conducted a GAD-7 and patient scored a 16, at her last visit she scored 20.  Provider also conducted a PHQ-9 and patient scored a 20, at his last visit he scored a 24.  He endorses hypersomnia sleep (12-24 hours) and poor appetite.  Patient informed Probation officer that he has lost 10 pounds since his last visit.  Today he denies SI/HI/AVH, mania, paranoia.    Patient informed writer that he has constant back pain.  He quantifies his pain as an 8 out of  10.  He notes that he has not seen his primary care doctor in a while.  Provider encouraged patient to schedule an appointment with primary care to assess current pain.  Provider also recommended that patient request lab work.  He endorsed understanding and agreed.    Today Cymbalta increased from 80 mg daily to 60 mg twice daily.  Provider also recommended dose reductions of propanolol as it could be contributing to increase sedation.  Patient received Xanax from his primary care doctor and provider recommended that this be reduced as well educated regarding breakthrough anxiety.  He will continue other medications as prescribed.  No other concerns at this time. Visit Diagnosis:    ICD-10-CM   1. Bipolar I disorder, most recent episode depressed (HCC)  F31.30 ARIPiprazole (ABILIFY) 20 MG tablet    buPROPion (WELLBUTRIN XL) 150 MG 24 hr tablet    buPROPion (WELLBUTRIN XL) 300 MG 24 hr tablet    DULoxetine (CYMBALTA) 60 MG capsule    Oxcarbazepine (TRILEPTAL) 300 MG tablet    2. Generalized anxiety disorder  F41.1 DULoxetine (CYMBALTA) 60 MG capsule    propranolol (INDERAL) 10 MG tablet    hydrOXYzine (ATARAX) 25 MG tablet         Past Psychiatric History: anxiety, depression and PTSD  Past Medical History:  Past Medical History:  Diagnosis Date   Anxiety    Chest heaviness    Chills    Depression, major    Diabetes mellitus without complication ([place])    Dizzy    Dyslipidemia  ED (erectile dysfunction)    Elevated CPK    HA (headache)    Hypertension    Hypertriglyceridemia    Insomnia    Kidney stone    Left-sided Bell's palsy    Morbid obesity (HCC)    Nausea    No appetite    Panniculitis    of right lower leg   PTSD (post-traumatic stress disorder)    Weakness     Past Surgical History:  Procedure Laterality Date   NO PAST SURGERIES      Family Psychiatric History: Sister schizophrenia and bipolar disorder  Family History:  Family History  Problem  Relation Age of Onset   Thyroid disease Brother    Depression Brother    Heart disease Mother    Kidney failure Father    Diabetes Father    Thyroid disease Sister    Mental illness Sister    Heart attack Maternal Grandfather    Heart failure Paternal Grandmother    Cancer Paternal Grandfather        bone    Social History:  Social History   Socioeconomic History   Marital status: Single    Spouse name: Not on file   Number of children: 0   Years of education: 12   Highest education level: Not on file  Occupational History   Not on file  Tobacco Use   Smoking status: Never   Smokeless tobacco: Never  Vaping Use   Vaping Use: Never used  Substance and Sexual Activity   Alcohol use: No   Drug use: No   Sexual activity: Not on file  Other Topics Concern   Not on file  Social History Narrative   Lives with brother in a one story home.  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    Social Determinants of Health   Financial Resource Strain: Not on file  Food Insecurity: Not on file  Transportation Needs: Not on file  Physical Activity: Not on file  Stress: Not on file  Social Connections: Not on file    Allergies: No Known Allergies  Metabolic Disorder Labs: No results found for: "HGBA1C", "MPG" No results found for: "PROLACTIN" Lab Results  Component Value Date   CHOL 161 07/23/2021   TRIG 245 (H) 07/23/2021   HDL 36 (L) 07/23/2021   CHOLHDL 4.5 07/23/2021   [place] I 84 07/23/2021   No results found for: "TSH"  Therapeutic Level Labs: No results found for: "LITHIUM" No results found for: "VALPROATE" No results found for: "CBMZ"  Current Medications: Current Outpatient Medications  Medication Sig Dispense Refill   ALPRAZolam (XANAX) 0.5 MG tablet Take 0.5 mg by mouth 3 (three) times daily as needed for anxiety.     ARIPiprazole (ABILIFY) 20 MG tablet Take 1 tablet (20 mg total) by mouth daily. 30 tablet 3   aspirin EC 81 MG tablet  Take 81 mg by mouth daily.     atorvastatin (LIPITOR) 20 MG tablet Take 20 mg by mouth daily.     buPROPion (WELLBUTRIN XL) 150 MG 24 hr tablet TAKE 1 TABLET BY MOUTH EVERY MORNING WITH 300MG  MG (450MG  TOTAL) 30 tablet 3   buPROPion (WELLBUTRIN XL) 300 MG 24 hr tablet Take 1 tablet (300 mg total) by mouth daily. 30 tablet 3   cholecalciferol (VITAMIN D) 1000 units tablet Take 1,000 Units by mouth daily.     CINNAMON PO Take 1 capsule by mouth daily.     Cyanocobalamin (VITAMIN B-12  PO) Take 1 tablet by mouth daily.     DULoxetine (CYMBALTA) 20 MG capsule Take 1 capsule (20 mg total) by mouth daily. 30 capsule 2   DULoxetine (CYMBALTA) 60 MG capsule Take 1 capsule (60 mg total) by mouth 2 (two) times daily. 60 capsule 3   glimepiride (AMARYL) 4 MG tablet Take 4 mg by mouth 2 (two) times daily.     hydrOXYzine (ATARAX) 25 MG tablet TAKE 1 TABLET BY MOUTH FOUR TIMES A DAY & TAKE 2 TABLETS EVERY NIGHT AT BEDTIME FOR ANXIETY, PANIC, AGITATION, AND SLEEP 180 tablet 3   lisinopril (ZESTRIL) 20 MG tablet Take 1 tablet (20 mg total) by mouth daily. 90 tablet 3   metFORMIN (GLUCOPHAGE) 500 MG tablet Take 500 mg by mouth 2 (two) times daily.     metoprolol tartrate (LOPRESSOR) 50 MG tablet TAKE 1 TABLET BY MOUTH 2 HOURS PRIOR TO SCHEDULED PROCEDURE 1 tablet 0   Multiple Vitamin (MULTIVITAMIN) tablet Take 1 tablet by mouth daily.     Omega-3 1000 MG CAPS Take 1,000 mg by mouth daily.     omeprazole (PRILOSEC) 20 MG capsule Take 20 mg by mouth daily.     Oxcarbazepine (TRILEPTAL) 300 MG tablet Take 1 tablet (300 mg total) by mouth 3 (three) times daily. 90 tablet 3   pantoprazole (PROTONIX) 20 MG tablet Take 1 tablet (20 mg total) by mouth daily. 30 tablet 0   promethazine (PHENERGAN) 25 MG tablet Take 25 mg by mouth every 6 (six) hours as needed for nausea or vomiting.     propranolol (INDERAL) 10 MG tablet Take 1 tablet (10 mg total) by mouth 3 (three) times daily. 90 tablet 3   sildenafil (REVATIO) 20 MG  tablet Take 20 mg by mouth 3 (three) times daily.     sucralfate (CARAFATE) 1 GM/10ML suspension Take 10 mLs (1 g total) by mouth 4 (four) times daily -  with meals and at bedtime. 420 mL 0   No current facility-administered medications for this visit.     Musculoskeletal: Strength & Muscle Tone: within normal limits and telehealth visit Gait & Station: normal, telehealth visit Patient leans: N/A  Psychiatric Specialty Exam: Review of Systems  There were no vitals taken for this visit.There is no height or weight on file to calculate BMI.  General Appearance: Well Groomed  Eye Contact:  Good  Speech:  Clear and Coherent and Normal Rate  Volume:  Normal  Mood:  Anxious and Depressed  Affect:  Congruent  Thought Process:  Coherent, Goal Directed and Linear  Orientation:  Full (Time, Place, and Person)  Thought Content: WDL and Logical   Suicidal Thoughts:  No  Homicidal Thoughts:  No  Memory:  Immediate;   Good Recent;   Good Remote;   Good  Judgement:  Good  Insight:  Good  Psychomotor Activity:  Normal  Concentration:  Concentration: Good and Attention Span: Good  Recall:  Good  Fund of Knowledge: Good  Language: Good  Akathisia:  No  Handed:  Right  AIMS (if indicated): Not done  Assets:  Communication Skills Desire for Improvement Financial Resources/Insurance Housing Social Support  ADL's:  Intact  Cognition: WNL  Sleep:  Fair   Screenings: GAD-7    Flowsheet Row Video Visit from 01/25/2022 in [facility] Video Visit from 10/21/2021 in [facility] Video Visit from 05/15/2021 in [facility] Video Visit from 04/22/2020 in [facility]  Total GAD-7 Score 16 20 20 13       PHQ2-9    Flowsheet Row Video Visit from 01/25/2022 in [facility] Video Visit from 10/21/2021 in [facility] Video Visit from  05/15/2021 in [facility] Video Visit from 04/22/2020 in [place]  PHQ-2 Total Score 6 6 6 5   PHQ-9 Total Score 20 24 23 19       Flowsheet Row Video Visit from 10/21/2021 in [facility] Video Visit from 05/15/2021 in [place] Error: Q7 should not be populated when Q6 is No Error: Q7 should not be populated when Q6 is No        Assessment and Plan: Patient endorses symptoms of anxiety, depression, hypersomnia. Today Cymbalta increased from 80 mg daily to 60 mg twice daily.  Provider also recommended dose reductions of propanolol as it could be contributing to increase sedation.  Patient received Xanax from his primary care doctor and provider recommended that this be reduced as well educated regarding breakthrough anxiety.  He will continue other medications as prescribed. 1. Bipolar I disorder, most recent episode depressed (HCC)  Continue- ARIPiprazole (ABILIFY) 20 MG tablet; Take 1 tablet (20 mg total) by mouth daily.  Dispense: 30 tablet; Refill: 3 Continue- buPROPion (WELLBUTRIN XL) 150 MG 24 hr tablet; TAKE 1 TABLET BY MOUTH EVERY MORNING WITH 300MG  MG (450MG  TOTAL)  Dispense: 30 tablet; Refill: 3 Continue- buPROPion (WELLBUTRIN XL) 300 MG 24 hr tablet; Take 1 tablet (300 mg total) by mouth daily.  Dispense: 30 tablet; Refill: 3 Increased- DULoxetine (CYMBALTA) 60 MG capsule; Take 1 capsule (60 mg total) by mouth 2 (two) times daily.  Dispense: 60 capsule; Refill: 3 Continue- Oxcarbazepine (TRILEPTAL) 300 MG tablet; Take 1 tablet (300 mg total) by mouth 3 (three) times daily.  Dispense: 90 tablet; Refill: 3  2. Generalized anxiety disorder  Increased- DULoxetine (CYMBALTA) 60 MG capsule; Take 1 capsule (60 mg total) by mouth 2 (two) times daily.  Dispense: 60 capsule; Refill: 3 Continue- propranolol (INDERAL) 10 MG tablet; Take 1 tablet (10 mg total)  by mouth 3 (three) times daily.  Dispense: 90 tablet; Refill: 3 Continue- hydrOXYzine (ATARAX) 25 MG tablet; TAKE 1 TABLET BY MOUTH FOUR TIMES A DAY & TAKE 2 TABLETS EVERY NIGHT AT BEDTIME FOR ANXIETY, PANIC, AGITATION, AND SLEEP  Dispense: 180 tablet; Refill: 3    Follow-up in 3 months

## 2022-02-17 NOTE — Progress Notes (Deleted)
NEUROLOGY CONSULTATION NOTE  [name] [name] MRN: [id number] DOB: [birth year]  Referring provider: Evangeline Dakin, PA Primary care provider: Georgann Housekeeper, MD  Reason for consult:  vertigo, headache  Assessment/Plan:   ***   Subjective:  [name] [name] is a 56 year old male with HTN, hypertriglyceridemia, DM II, PTSD and history of left sided Bell's palsy at 56 years old and kidney stones who presents for dizziness and headaches.  History supplemented by referring provider's note.  History of headaches and vertigo off and on for several years.  They have been worse over the past year.  ***.  He has gone to vestibular rehab ***  Past NSAIDS/analgesics:  *** Past abortive triptans:  *** Past abortive ergotamine:  *** Past muscle relaxants:  *** Past anti-emetic:  *** Past antihypertensive medications:  *** Past antidepressant medications:  *** Past anticonvulsant medications:  *** Past anti-CGRP:  *** Past vitamins/Herbal/Supplements:  *** Past antihistamines/decongestants:  *** Other past therapies:  ***  Current NSAIDS/analgesics:  ASA 81mg  daily Current triptans:  *** Current ergotamine:  *** Current anti-emetic:  *** Current muscle relaxants:  tizanidine 4mg  TID PRN Current Antihypertensive medications:  propranolol 20mt TID, lisinopril 5mg  Current Antidepressant medications:  duloxetine 90mg  daily, bupropion 450mg  daily Current Anticonvulsant medications:  oxcarbazepine 300mg  BID Current anti-CGRP:  *** Current Antihistamines/Decongestants:  meclizine 25mg  BID PRN, hydroxyzine 25mg  QID and 50mg  QHS Other therapy:  *** Hormone/birth control:  *** Other medications:  ***   Caffeine:  *** Alcohol:  *** Smoker:  *** Diet:  *** Exercise:  *** Depression:  ***; Anxiety:  *** Other pain:  *** Sleep hygiene:  *** Family history of headache:  ***      PAST MEDICAL HISTORY: Past Medical History:  Diagnosis Date   Anxiety    Chest heaviness    Chills     Depression, major    Diabetes mellitus without complication (HCC)    Dizzy    Dyslipidemia    ED (erectile dysfunction)    Elevated CPK    HA (headache)    Hypertension    Hypertriglyceridemia    Insomnia    Kidney stone    Left-sided Bell's palsy    Morbid obesity (HCC)    Nausea    No appetite    Panniculitis    of right lower leg   PTSD (post-traumatic stress disorder)    Weakness     PAST SURGICAL HISTORY: Past Surgical History:  Procedure Laterality Date   NO PAST SURGERIES      MEDICATIONS: Current Outpatient Medications on File Prior to Visit  Medication Sig Dispense Refill   ALPRAZolam (XANAX) 0.5 MG tablet Take 0.5 mg by mouth 3 (three) times daily as needed for anxiety.     ARIPiprazole (ABILIFY) 20 MG tablet Take 1 tablet (20 mg total) by mouth daily. 30 tablet 3   aspirin EC 81 MG tablet Take 81 mg by mouth daily.     atorvastatin (LIPITOR) 20 MG tablet Take 20 mg by mouth daily.     buPROPion (WELLBUTRIN XL) 150 MG 24 hr tablet TAKE 1 TABLET BY MOUTH EVERY MORNING WITH 300MG  MG (450MG  TOTAL) 30 tablet 3   buPROPion (WELLBUTRIN XL) 300 MG 24 hr tablet Take 1 tablet (300 mg total) by mouth daily. 30 tablet 3   cholecalciferol (VITAMIN D) 1000 units tablet Take 1,000 Units by mouth daily.     CINNAMON PO Take 1 capsule by mouth daily.     Cyanocobalamin (VITAMIN B-12  PO) Take 1 tablet by mouth daily.     DULoxetine (CYMBALTA) 20 MG capsule Take 1 capsule (20 mg total) by mouth daily. 30 capsule 2   DULoxetine (CYMBALTA) 60 MG capsule Take 1 capsule (60 mg total) by mouth 2 (two) times daily. 60 capsule 3   glimepiride (AMARYL) 4 MG tablet Take 4 mg by mouth 2 (two) times daily.     hydrOXYzine (ATARAX) 25 MG tablet TAKE 1 TABLET BY MOUTH FOUR TIMES A DAY & TAKE 2 TABLETS EVERY NIGHT AT BEDTIME FOR ANXIETY, PANIC, AGITATION, AND SLEEP 180 tablet 3   lisinopril (ZESTRIL) 20 MG tablet Take 1 tablet (20 mg total) by mouth daily. 90 tablet 3   metFORMIN (GLUCOPHAGE)  500 MG tablet Take 500 mg by mouth 2 (two) times daily.     metoprolol tartrate (LOPRESSOR) 50 MG tablet TAKE 1 TABLET BY MOUTH 2 HOURS PRIOR TO SCHEDULED PROCEDURE 1 tablet 0   Multiple Vitamin (MULTIVITAMIN) tablet Take 1 tablet by mouth daily.     Omega-3 1000 MG CAPS Take 1,000 mg by mouth daily.     omeprazole (PRILOSEC) 20 MG capsule Take 20 mg by mouth daily.     Oxcarbazepine (TRILEPTAL) 300 MG tablet Take 1 tablet (300 mg total) by mouth 3 (three) times daily. 90 tablet 3   pantoprazole (PROTONIX) 20 MG tablet Take 1 tablet (20 mg total) by mouth daily. 30 tablet 0   promethazine (PHENERGAN) 25 MG tablet Take 25 mg by mouth every 6 (six) hours as needed for nausea or vomiting.     propranolol (INDERAL) 10 MG tablet Take 1 tablet (10 mg total) by mouth 3 (three) times daily. 90 tablet 3   sildenafil (REVATIO) 20 MG tablet Take 20 mg by mouth 3 (three) times daily.     sucralfate (CARAFATE) 1 GM/10ML suspension Take 10 mLs (1 g total) by mouth 4 (four) times daily -  with meals and at bedtime. 420 mL 0   No current facility-administered medications on file prior to visit.    ALLERGIES: No Known Allergies  FAMILY HISTORY: Family History  Problem Relation Age of Onset   Thyroid disease Brother    Depression Brother    Heart disease Mother    Kidney failure Father    Diabetes Father    Thyroid disease Sister    Mental illness Sister    Heart attack Maternal Grandfather    Heart failure Paternal Grandmother    Cancer Paternal Grandfather        bone    Objective:  *** General: No acute distress.  Patient appears well-groomed.   Head:  Normocephalic/atraumatic Eyes:  fundi examined but not visualized Neck: supple, no paraspinal tenderness, full range of motion Back: No paraspinal tenderness Heart: regular rate and rhythm Lungs: Clear to auscultation bilaterally. Vascular: No carotid bruits. Neurological Exam: Mental status: alert and oriented to person, place, and time,  speech fluent and not dysarthric, language intact. Cranial nerves: CN I: not tested CN II: pupils equal, round and reactive to light, visual fields intact CN III, IV, VI:  full range of motion, no nystagmus, no ptosis CN V: facial sensation intact. CN VII: upper and lower face symmetric CN VIII: hearing intact CN IX, X: gag intact, uvula midline CN XI: sternocleidomastoid and trapezius muscles intact CN XII: tongue midline Bulk & Tone: normal, no fasciculations. Motor:  muscle strength 5/5 throughout Sensation:  Pinprick, temperature and vibratory sensation intact. Deep Tendon Reflexes:  2+ throughout,  toes  downgoing.   Finger to nose testing:  Without dysmetria.   Heel to shin:  Without dysmetria.   Gait:  Normal station and stride.  Romberg negative.    Thank you for allowing me to take part in the care of this patient.  Metta Clines, DO  CC: ***

## 2022-02-19 ENCOUNTER — Encounter: Payer: Self-pay | Admitting: Neurology

## 2022-02-19 ENCOUNTER — Ambulatory Visit: Payer: Self-pay | Admitting: Neurology

## 2022-02-19 DIAGNOSIS — Z029 Encounter for administrative examinations, unspecified: Secondary | ICD-10-CM

## 2022-04-08 ENCOUNTER — Other Ambulatory Visit (HOSPITAL_COMMUNITY): Payer: Self-pay | Admitting: Psychiatry

## 2022-04-08 DIAGNOSIS — F3130 Bipolar disorder, current episode depressed, mild or moderate severity, unspecified: Secondary | ICD-10-CM

## 2022-04-08 DIAGNOSIS — F411 Generalized anxiety disorder: Secondary | ICD-10-CM

## 2022-04-26 ENCOUNTER — Telehealth (INDEPENDENT_AMBULATORY_CARE_PROVIDER_SITE_OTHER): Payer: Commercial Managed Care - HMO | Admitting: Psychiatry

## 2022-04-26 ENCOUNTER — Encounter (HOSPITAL_COMMUNITY): Payer: Self-pay | Admitting: Psychiatry

## 2022-04-26 DIAGNOSIS — F3130 Bipolar disorder, current episode depressed, mild or moderate severity, unspecified: Secondary | ICD-10-CM | POA: Diagnosis not present

## 2022-04-26 DIAGNOSIS — F411 Generalized anxiety disorder: Secondary | ICD-10-CM

## 2022-04-26 MED ORDER — DULOXETINE HCL 60 MG PO CPEP: 60.0000 mg | ORAL_CAPSULE | Freq: Two times a day (BID) | ORAL | 3 refills | Status: DC

## 2022-04-26 MED ORDER — BUPROPION HCL ER (XL) 300 MG PO TB24: 300.0000 mg | ORAL_TABLET | Freq: Every day | ORAL | 3 refills | Status: DC

## 2022-04-26 MED ORDER — BUPROPION HCL ER (XL) 150 MG PO TB24: ORAL_TABLET | ORAL | 3 refills | Status: DC

## 2022-04-26 MED ORDER — OXCARBAZEPINE 300 MG PO TABS: 300.0000 mg | ORAL_TABLET | Freq: Two times a day (BID) | ORAL | 3 refills | Status: DC

## 2022-04-26 MED ORDER — ARIPIPRAZOLE 20 MG PO TABS: 20.0000 mg | ORAL_TABLET | Freq: Every day | ORAL | 3 refills | Status: DC

## 2022-04-26 MED ORDER — HYDROXYZINE HCL 25 MG PO TABS: ORAL_TABLET | ORAL | 3 refills | Status: DC

## 2022-04-26 MED ORDER — PROPRANOLOL HCL 10 MG PO TABS
10.0000 mg | ORAL_TABLET | Freq: Three times a day (TID) | ORAL | 3 refills | Status: DC
Start: 2022-04-26 — End: 2022-07-28

## 2022-04-26 NOTE — Progress Notes (Signed)
BH MD/PA/NP OP Progress Note Virtual Visit via Telephone Note  I connected with [name] [name] on 04/26/22 at 11:30 AM EST by telephone and verified that I am speaking with the correct person using two identifiers.  Location: Patient: home Provider: Clinic   I discussed the limitations, risks, security and privacy concerns of performing an evaluation and management service by telephone and the availability of in person appointments. I also discussed with the patient that there may be a patient responsible charge related to this service. The patient expressed understanding and agreed to proceed.   I provided 30 minutes of non-face-to-face time during this encounter.      04/26/2022 9:47 AM [name] [name]  MRN:  [id number]  Chief Complaint: "'I am feeling depressed".   HPI:  56 year old male seen today for follow up psychiatric evaluation. He has a history of anxiety, depression and PTSD. He is currently managed onTrileptal 300 mg BID, duloxetine 60 mg twice daily, buproprion 450 mg once daily, Abilify 20 mg once daily, propanolol 10 mg three times, and Xanax 0.5 mg TID (prescribed by his PCP). Patient reports his medications are effective in managing his psychiatric conditions.  Today he was unable to login virtually so his assessment was done over the phone.  During exam he was pleasant, cooperative, and engaged in conversation.  He informed Clinical research associate that he is feeling better since his last visit.  He notes that he is less anxious and depressed.  Patient notes that he is looking forward to spending the Christmas Holiday with his fiance cooking in his home.  Patient notes that current stressors include having to move.  He notes that his brother is selling his home and is requesting that he vacate the premises.  Today provider conducted a GAD-7 and patient scored a 14, at his last visit he scored a 16.  Provider also conducted PHQ-9 of he scored a 17, at his last visit he scored a 20.  He  endorses adequate appetite and increased sleep.  Patient notes that he can sleep all day.  Today he denies SI/HI/AVH, mania, paranoia.    Patient reports that he continues to have bodily pain.  He quantifies his pain as a 5 out of 10.  Patient has an appointment with his PCP later on this week.    Patient informed Clinical research associate that the above stressors are situational but notes that he is doing well on her current medication regimen.  No medication changes made today.  Patient agreeable to continue medications as prescribed.  No other concerns at this time.   ICD-10-CM   1. Bipolar I disorder, most recent episode depressed (HCC)  F31.30 ARIPiprazole (ABILIFY) 20 MG tablet    buPROPion (WELLBUTRIN XL) 300 MG 24 hr tablet    buPROPion (WELLBUTRIN XL) 150 MG 24 hr tablet    DULoxetine (CYMBALTA) 60 MG capsule    Oxcarbazepine (TRILEPTAL) 300 MG tablet    2. Generalized anxiety disorder  F41.1 DULoxetine (CYMBALTA) 60 MG capsule    hydrOXYzine (ATARAX) 25 MG tablet    propranolol (INDERAL) 10 MG tablet          Past Psychiatric History: anxiety, depression and PTSD  Past Medical History:  Past Medical History:  Diagnosis Date   Anxiety    Chest heaviness    Chills    Depression, major    Diabetes mellitus without complication (HCC)    Dizzy    Dyslipidemia    ED (erectile dysfunction)  Elevated CPK    HA (headache)    Hypertension    Hypertriglyceridemia    Insomnia    Kidney stone    Left-sided Bell's palsy    Morbid obesity (HCC)    Nausea    No appetite    Panniculitis    of right lower leg   PTSD (post-traumatic stress disorder)    Weakness     Past Surgical History:  Procedure Laterality Date   NO PAST SURGERIES      Family Psychiatric History: Sister schizophrenia and bipolar disorder  Family History:  Family History  Problem Relation Age of Onset   Thyroid disease Brother    Depression Brother    Heart disease Mother    Kidney failure Father    Diabetes  Father    Thyroid disease Sister    Mental illness Sister    Heart attack Maternal Grandfather    Heart failure Paternal Grandmother    Cancer Paternal Grandfather        bone    Social History:  Social History   Socioeconomic History   Marital status: Single    Spouse name: Not on file   Number of children: 0   Years of education: 12   Highest education level: Not on file  Occupational History   Not on file  Tobacco Use   Smoking status: Never   Smokeless tobacco: Never  Vaping Use   Vaping Use: Never used  Substance and Sexual Activity   Alcohol use: No   Drug use: No   Sexual activity: Not on file  Other Topics Concern   Not on file  Social History Narrative   Lives with brother in a one story home.  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    Social Determinants of Health   Financial Resource Strain: Not on file  Food Insecurity: Not on file  Transportation Needs: Not on file  Physical Activity: Not on file  Stress: Not on file  Social Connections: Not on file    Allergies: No Known Allergies  Metabolic Disorder Labs: No results found for: "HGBA1C", "MPG" No results found for: "PROLACTIN" Lab Results  Component Value Date   CHOL 161 07/23/2021   TRIG 245 (H) 07/23/2021   HDL 36 (L) 07/23/2021   CHOLHDL 4.5 07/23/2021   [place] 84 07/23/2021   No results found for: "TSH"  Therapeutic Level Labs: No results found for: "LITHIUM" No results found for: "VALPROATE" No results found for: "CBMZ"  Current Medications: Current Outpatient Medications  Medication Sig Dispense Refill   ALPRAZolam (XANAX) 0.5 MG tablet Take 0.5 mg by mouth 3 (three) times daily as needed for anxiety.     ARIPiprazole (ABILIFY) 20 MG tablet Take 1 tablet (20 mg total) by mouth daily. 30 tablet 3   aspirin EC 81 MG tablet Take 81 mg by mouth daily.     atorvastatin (LIPITOR) 20 MG tablet Take 20 mg by mouth daily.     buPROPion (WELLBUTRIN XL) 150 MG 24 hr  tablet TAKE 1 TABLET BY MOUTH EVERY MORNING WITH 300MG  MG (450MG  TOTAL) 30 tablet 3   buPROPion (WELLBUTRIN XL) 300 MG 24 hr tablet Take 1 tablet (300 mg total) by mouth daily. 30 tablet 3   cholecalciferol (VITAMIN D) 1000 units tablet Take 1,000 Units by mouth daily.     CINNAMON PO Take 1 capsule by mouth daily.     Cyanocobalamin (VITAMIN B-12 PO) Take 1 tablet by mouth  daily.     DULoxetine (CYMBALTA) 60 MG capsule Take 1 capsule (60 mg total) by mouth 2 (two) times daily. 60 capsule 3   glimepiride (AMARYL) 4 MG tablet Take 4 mg by mouth 2 (two) times daily.     hydrOXYzine (ATARAX) 25 MG tablet TAKE 1 TABLET BY MOUTH FOUR TIMES A DAY & TAKE 2 TABLETS EVERY NIGHT AT BEDTIME FOR ANXIETY, PANIC, AGITATION, AND SLEEP 180 tablet 3   lisinopril (ZESTRIL) 20 MG tablet Take 1 tablet (20 mg total) by mouth daily. 90 tablet 3   metFORMIN (GLUCOPHAGE) 500 MG tablet Take 500 mg by mouth 2 (two) times daily.     metoprolol tartrate (LOPRESSOR) 50 MG tablet TAKE 1 TABLET BY MOUTH 2 HOURS PRIOR TO SCHEDULED PROCEDURE 1 tablet 0   Multiple Vitamin (MULTIVITAMIN) tablet Take 1 tablet by mouth daily.     Omega-3 1000 MG CAPS Take 1,000 mg by mouth daily.     omeprazole (PRILOSEC) 20 MG capsule Take 20 mg by mouth daily.     Oxcarbazepine (TRILEPTAL) 300 MG tablet Take 1 tablet (300 mg total) by mouth 2 (two) times daily. 60 tablet 3   pantoprazole (PROTONIX) 20 MG tablet Take 1 tablet (20 mg total) by mouth daily. 30 tablet 0   promethazine (PHENERGAN) 25 MG tablet Take 25 mg by mouth every 6 (six) hours as needed for nausea or vomiting.     propranolol (INDERAL) 10 MG tablet Take 1 tablet (10 mg total) by mouth 3 (three) times daily. 90 tablet 3   sildenafil (REVATIO) 20 MG tablet Take 20 mg by mouth 3 (three) times daily.     sucralfate (CARAFATE) 1 GM/10ML suspension Take 10 mLs (1 g total) by mouth 4 (four) times daily -  with meals and at bedtime. 420 mL 0   No current facility-administered  medications for this visit.     Musculoskeletal: Strength & Muscle Tone:  Unable to assess due to telephone visit Gait & Station:  Unable to assess due to telephone vis Patient leans: N/A  Psychiatric Specialty Exam: Review of Systems  There were no vitals taken for this visit.There is no height or weight on file to calculate BMI.  General Appearance:  Unable to assess due to telephone vis  Eye Contact:   Unable to assess due to telephone vis  Speech:  Clear and Coherent and Normal Rate  Volume:  Normal  Mood:  Anxious and Depressed, notes inproving  Affect:  Congruent  Thought Process:  Coherent, Goal Directed and Linear  Orientation:  Full (Time, Place, and Person)  Thought Content: WDL and Logical   Suicidal Thoughts:  No  Homicidal Thoughts:  No  Memory:  Immediate;   Good Recent;   Good Remote;   Good  Judgement:  Good  Insight:  Good  Psychomotor Activity:   Unable to assess due to telephone vis  Concentration:  Concentration: Good and Attention Span: Good  Recall:  Good  Fund of Knowledge: Good  Language: Good  Akathisia:   Unable to assess due to telephone vis  Handed:  Right  AIMS (if indicated): Not done  Assets:  Communication Skills Desire for Improvement Financial Resources/Insurance Housing Social Support  ADL's:  Intact  Cognition: WNL  Sleep:  Fair   Screenings: GAD-7    Flowsheet Row Video Visit from 04/26/2022 in [facility] Video Visit from 01/25/2022 in [facility] Video Visit from 10/21/2021 in [facility] Video Visit from 05/15/2021 in [facility] Video Visit from 04/22/2020 in [facility]  Total GAD-7 Score 14 16 20 20 13       PHQ2-9    Flowsheet Row Video Visit from 04/26/2022 in [facility] Video Visit from 01/25/2022 in [facility] Video  Visit from 10/21/2021 in [facility] Video Visit from 05/15/2021 in [facility] Video Visit from 04/22/2020 in [facility]  PHQ-2 Total Score 5 6 6 6 5   PHQ-9 Total Score 17 20 24 23 19       Flowsheet Row Video Visit from 10/21/2021 in [facility] Video Visit from 05/15/2021 in [facility]  C-SSRS RISK CATEGORY Error: Q7 should not be populated when Q6 is No Error: Q7 should not be populated when Q6 is No        Assessment and Plan: Patient endorses symptoms of anxiety, depression, hypersomnia.  He reports that his anxiety and depression are situational and request that medications not he adjusted.  No medication changes made today.  Patient agreeable to continue medication as prescribed.    1. Bipolar I disorder, most recent episode depressed (HCC)  Continue- ARIPiprazole (ABILIFY) 20 MG tablet; Take 1 tablet (20 mg total) by mouth daily.  Dispense: 30 tablet; Refill: 3 Continue- buPROPion (WELLBUTRIN XL) 150 MG 24 hr tablet; TAKE 1 TABLET BY MOUTH EVERY MORNING WITH 300MG  MG (450MG  TOTAL)  Dispense: 30 tablet; Refill: 3 Continue- buPROPion (WELLBUTRIN XL) 300 MG 24 hr tablet; Take 1 tablet (300 mg total) by mouth daily.  Dispense: 30 tablet; Refill: 3 Increased- DULoxetine (CYMBALTA) 60 MG capsule; Take 1 capsule (60 mg total) by mouth 2 (two) times daily.  Dispense: 60 capsule; Refill: 3 Continue- Oxcarbazepine (TRILEPTAL) 300 MG tablet; Take 1 tablet (300 mg total) by mouth 2 (three) times daily.  Dispense: 90 tablet; Refill: 3  2. Generalized anxiety disorder  Continue- DULoxetine (CYMBALTA) 60 MG capsule; Take 1 capsule (60 mg total) by mouth 2 (two) times daily.  Dispense: 60 capsule; Refill: 3 Continue- propranolol (INDERAL) 10 MG tablet; Take 1 tablet (10 mg total) by mouth 3 (three) times daily.  Dispense: 90 tablet; Refill: 3 Continue-  hydrOXYzine (ATARAX) 25 MG tablet; TAKE 1 TABLET BY MOUTH FOUR TIMES A DAY & TAKE 2 TABLETS EVERY NIGHT AT BEDTIME FOR ANXIETY, PANIC, AGITATION, AND SLEEP  Dispense: 180 tablet; Refill: 3    Follow-up in 3 months

## 2022-07-26 ENCOUNTER — Other Ambulatory Visit (HOSPITAL_COMMUNITY): Payer: Self-pay | Admitting: Psychiatry

## 2022-07-26 DIAGNOSIS — F3130 Bipolar disorder, current episode depressed, mild or moderate severity, unspecified: Secondary | ICD-10-CM

## 2022-07-26 DIAGNOSIS — F411 Generalized anxiety disorder: Secondary | ICD-10-CM

## 2022-07-27 ENCOUNTER — Other Ambulatory Visit (HOSPITAL_COMMUNITY): Payer: Self-pay | Admitting: Psychiatry

## 2022-07-27 DIAGNOSIS — F411 Generalized anxiety disorder: Secondary | ICD-10-CM

## 2022-07-27 DIAGNOSIS — F3130 Bipolar disorder, current episode depressed, mild or moderate severity, unspecified: Secondary | ICD-10-CM

## 2022-07-28 ENCOUNTER — Telehealth (INDEPENDENT_AMBULATORY_CARE_PROVIDER_SITE_OTHER): Payer: Medicaid Other | Admitting: Psychiatry

## 2022-07-28 ENCOUNTER — Other Ambulatory Visit (HOSPITAL_COMMUNITY): Payer: Self-pay | Admitting: Psychiatry

## 2022-07-28 ENCOUNTER — Encounter (HOSPITAL_COMMUNITY): Payer: Self-pay | Admitting: Psychiatry

## 2022-07-28 DIAGNOSIS — Z0000 Encounter for general adult medical examination without abnormal findings: Secondary | ICD-10-CM | POA: Diagnosis not present

## 2022-07-28 DIAGNOSIS — F411 Generalized anxiety disorder: Secondary | ICD-10-CM | POA: Diagnosis not present

## 2022-07-28 DIAGNOSIS — F3130 Bipolar disorder, current episode depressed, mild or moderate severity, unspecified: Secondary | ICD-10-CM

## 2022-07-28 MED ORDER — ARIPIPRAZOLE 30 MG PO TABS: 30.0000 mg | ORAL_TABLET | Freq: Every day | ORAL | 3 refills | Status: DC

## 2022-07-28 MED ORDER — BUPROPION HCL ER (XL) 300 MG PO TB24: 300.0000 mg | ORAL_TABLET | Freq: Every day | ORAL | 3 refills | Status: DC

## 2022-07-28 MED ORDER — DULOXETINE HCL 60 MG PO CPEP: 60.0000 mg | ORAL_CAPSULE | Freq: Two times a day (BID) | ORAL | 3 refills | Status: DC

## 2022-07-28 MED ORDER — BUPROPION HCL ER (XL) 150 MG PO TB24: ORAL_TABLET | ORAL | 3 refills | Status: DC

## 2022-07-28 MED ORDER — OXCARBAZEPINE 300 MG PO TABS: 300.0000 mg | ORAL_TABLET | Freq: Two times a day (BID) | ORAL | 3 refills | Status: DC

## 2022-07-28 MED ORDER — PROPRANOLOL HCL 10 MG PO TABS
10.0000 mg | ORAL_TABLET | Freq: Three times a day (TID) | ORAL | 3 refills | Status: DC
Start: 2022-07-28 — End: 2022-11-23

## 2022-07-28 MED ORDER — HYDROXYZINE HCL 50 MG PO TABS: ORAL_TABLET | ORAL | 3 refills | Status: DC

## 2022-07-28 NOTE — Progress Notes (Signed)
BH MD/PA/NP OP Progress Note Virtual Visit via Video Note  I connected with [name] [name] on 07/28/22 at  9:30 AM EDT by a video enabled telemedicine application and verified that I am speaking with the correct person using two identifiers.  Location: Patient: Home Provider: Clinic   I discussed the limitations of evaluation and management by telemedicine and the availability of in person appointments. The patient expressed understanding and agreed to proceed.  I provided 30 minutes of non-face-to-face time during this encounter.        07/28/2022 10:53 AM [name] [name]  MRN:  [id number]  Chief Complaint: "'I am feeling depressed".   HPI:  57 year old male seen today for follow up psychiatric evaluation. He has a history of anxiety, depression and PTSD. He is currently managed onTrileptal 300 mg BID, duloxetine 60 mg twice daily, buproprion 450 mg once daily, Abilify 20 mg once daily, hydroxyzine 25 mg 4 times daily as needed, propanolol 10 mg three times, and Xanax 0.5 mg TID (prescribed by his PCP). Patient reports his medications are somewhat effective in managing his psychiatric conditions.  Today he was well groomed, pleasant, cooperative, engaged in conversation, and maintained eye contact. Patient notes that his situation has gotten worse. He notes that his brother sold his home and he is now living with his neighbor. Patient endorses symptoms of anhedonia. He also notes that he sleeps all day. Patient reports that his appetite has is reduced he notes lost over 40 pounds in the last 3 moths. He informed Probation officer that he also been having pain in his right shoulder and arm. He notes that it feels weak.  Patient notes that he no longer has a PCP.  Provider referred patient to community health and wellness for primary care.  Patient informed Probation officer that he has been actively looking for a job but has not been successful in finding any interviews.  He notes that this exacerbates his  anxiety and depression.  Today provider conducted a GAD-7 and patient scored a 19, at his last visit he scored a 14.  Provider also conducted PHQ-9 patient scored an 18, at his last visit he scored a 53. Today he denies SI/HI/AVH, mania, paranoia.     Today he is agreeable to increasing hydroxyzine 25 mg 4 times daily as needed to 50 mg 4 times daily as needed to help manage anxiety.  He is also agreeable to increasing Abilify 20 mg to start 30 mg daily to help manage depression.  Patient encouraged to take walks, socialize, and participate in activities at the [facility] such as Silver sneakers to help manage increased isolation.  He will continue all other medications as prescribed. No other concerns at this time.   ICD-10-CM   1. Well adult health check  Z00.00 Ambulatory referral to Internal Medicine    2. Generalized anxiety disorder  F41.1 hydrOXYzine (ATARAX) 50 MG tablet    DULoxetine (CYMBALTA) 60 MG capsule    propranolol (INDERAL) 10 MG tablet    3. Bipolar I disorder, most recent episode depressed (HCC)  F31.30 ARIPiprazole (ABILIFY) 30 MG tablet    buPROPion (WELLBUTRIN XL) 300 MG 24 hr tablet    buPROPion (WELLBUTRIN XL) 150 MG 24 hr tablet    DULoxetine (CYMBALTA) 60 MG capsule    Oxcarbazepine (TRILEPTAL) 300 MG tablet           Past Psychiatric History: anxiety, depression and PTSD  Past Medical History:  Past Medical History:  Diagnosis Date  Anxiety    Chest heaviness    Chills    Depression, major    Diabetes mellitus without complication ([place])    Dizzy    Dyslipidemia    ED (erectile dysfunction)    Elevated CPK    HA (headache)    Hypertension    Hypertriglyceridemia    Insomnia    Kidney stone    Left-sided Bell's palsy    Morbid obesity (HCC)    Nausea    No appetite    Panniculitis    of right lower leg   PTSD (post-traumatic stress disorder)    Weakness     Past Surgical History:  Procedure Laterality Date   NO PAST SURGERIES      Family  Psychiatric History: Sister schizophrenia and bipolar disorder  Family History:  Family History  Problem Relation Age of Onset   Thyroid disease Brother    Depression Brother    Heart disease Mother    Kidney failure Father    Diabetes Father    Thyroid disease Sister    Mental illness Sister    Heart attack Maternal Grandfather    Heart failure Paternal Grandmother    Cancer Paternal Grandfather        bone    Social History:  Social History   Socioeconomic History   Marital status: Single    Spouse name: Not on file   Number of children: 0   Years of education: 12   Highest education level: Not on file  Occupational History   Not on file  Tobacco Use   Smoking status: Never   Smokeless tobacco: Never  Vaping Use   Vaping Use: Never used  Substance and Sexual Activity   Alcohol use: No   Drug use: No   Sexual activity: Not on file  Other Topics Concern   Not on file  Social History Narrative   Lives with brother in a one story home.  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    Social Determinants of Health   Financial Resource Strain: Not on file  Food Insecurity: Not on file  Transportation Needs: Not on file  Physical Activity: Not on file  Stress: Not on file  Social Connections: Not on file    Allergies: No Known Allergies  Metabolic Disorder Labs: No results found for: "HGBA1C", "MPG" No results found for: "PROLACTIN" Lab Results  Component Value Date   CHOL 161 07/23/2021   TRIG 245 (H) 07/23/2021   HDL 36 (L) 07/23/2021   CHOLHDL 4.5 07/23/2021   [place] 84 07/23/2021   No results found for: "TSH"  Therapeutic Level Labs: No results found for: "LITHIUM" No results found for: "VALPROATE" No results found for: "CBMZ"  Current Medications: Current Outpatient Medications  Medication Sig Dispense Refill   ALPRAZolam (XANAX) 0.5 MG tablet Take 0.5 mg by mouth 3 (three) times daily as needed for anxiety.     ARIPiprazole  (ABILIFY) 30 MG tablet Take 1 tablet (30 mg total) by mouth daily. 30 tablet 3   aspirin EC 81 MG tablet Take 81 mg by mouth daily.     atorvastatin (LIPITOR) 20 MG tablet Take 20 mg by mouth daily.     buPROPion (WELLBUTRIN XL) 150 MG 24 hr tablet TAKE 1 TABLET BY MOUTH EVERY MORNING WITH 300MG  MG (450MG  TOTAL) 30 tablet 3   buPROPion (WELLBUTRIN XL) 300 MG 24 hr tablet Take 1 tablet (300 mg total) by mouth daily. 30 tablet  3   cholecalciferol (VITAMIN D) 1000 units tablet Take 1,000 Units by mouth daily.     CINNAMON PO Take 1 capsule by mouth daily.     Cyanocobalamin (VITAMIN B-12 PO) Take 1 tablet by mouth daily.     DULoxetine (CYMBALTA) 60 MG capsule Take 1 capsule (60 mg total) by mouth 2 (two) times daily. 60 capsule 3   glimepiride (AMARYL) 4 MG tablet Take 4 mg by mouth 2 (two) times daily.     hydrOXYzine (ATARAX) 50 MG tablet TAKE 1 TABLET BY MOUTH FOUR TIMES A DAY & TAKE 2 TABLETS EVERY NIGHT AT BEDTIME FOR ANXIETY, PANIC, AGITATION, AND SLEEP 120 tablet 3   lisinopril (ZESTRIL) 20 MG tablet Take 1 tablet (20 mg total) by mouth daily. 90 tablet 3   metFORMIN (GLUCOPHAGE) 500 MG tablet Take 500 mg by mouth 2 (two) times daily.     Multiple Vitamin (MULTIVITAMIN) tablet Take 1 tablet by mouth daily.     Omega-3 1000 MG CAPS Take 1,000 mg by mouth daily.     omeprazole (PRILOSEC) 20 MG capsule Take 20 mg by mouth daily.     Oxcarbazepine (TRILEPTAL) 300 MG tablet Take 1 tablet (300 mg total) by mouth 2 (two) times daily. 60 tablet 3   pantoprazole (PROTONIX) 20 MG tablet Take 1 tablet (20 mg total) by mouth daily. 30 tablet 0   promethazine (PHENERGAN) 25 MG tablet Take 25 mg by mouth every 6 (six) hours as needed for nausea or vomiting.     propranolol (INDERAL) 10 MG tablet Take 1 tablet (10 mg total) by mouth 3 (three) times daily. 90 tablet 3   sildenafil (REVATIO) 20 MG tablet Take 20 mg by mouth 3 (three) times daily.     sucralfate (CARAFATE) 1 GM/10ML suspension Take 10 mLs (1  g total) by mouth 4 (four) times daily -  with meals and at bedtime. 420 mL 0   No current facility-administered medications for this visit.     Musculoskeletal: Strength & Muscle Tone: within normal limits and telehealth visit Gait & Station: normal, telehealth visit Patient leans: N/A  Psychiatric Specialty Exam: Review of Systems  There were no vitals taken for this visit.There is no height or weight on file to calculate BMI.  General Appearance: Well Groomed  Eye Contact:  Good  Speech:  Clear and Coherent and Normal Rate  Volume:  Normal  Mood:  Anxious and Depressed  Affect:  Congruent  Thought Process:  Coherent, Goal Directed and Linear  Orientation:  Full (Time, Place, and Person)  Thought Content: WDL and Logical   Suicidal Thoughts:  No  Homicidal Thoughts:  No  Memory:  Immediate;   Good Recent;   Good Remote;   Good  Judgement:  Good  Insight:  Good  Psychomotor Activity:  Normal  Concentration:  Concentration: Good and Attention Span: Good  Recall:  Good  Fund of Knowledge: Good  Language: Good  Akathisia:  No  Handed:  Right  AIMS (if indicated): Not done  Assets:  Communication Skills Desire for Improvement Financial Resources/Insurance Housing Social Support  ADL's:  Intact  Cognition: WNL  Sleep:  Fair   Screenings: GAD-7    Flowsheet Row Video Visit from 07/28/2022 in [facility] Video Visit from 04/26/2022 in [facility] Video Visit from 01/25/2022 in [facility] Video Visit from 10/21/2021 in [facility] Video Visit from 05/15/2021 in [place]  [place]  Total GAD-7 Score 19 14 16 20 20       PHQ2-9    Flowsheet Row Video Visit from 07/28/2022 in [facility] Video Visit from 04/26/2022 in [facility] Video Visit from 01/25/2022 in [facility] Video Visit from 10/21/2021 in [facility] Video Visit from 05/15/2021 in [place]  PHQ-2 Total Score 6 5 6 6 6   PHQ-9 Total Score 18 17 20 24 23       Flowsheet Row Video Visit from 10/21/2021 in [facility] Video Visit from 05/15/2021 in [place] Error: Q7 should not be populated when Q6 is No Error: Q7 should not be populated when Q6 is No        Assessment and Plan: Patient endorses symptoms of anxiety, depression, hypersomnia.  Today he is agreeable to increasing hydroxyzine 25 mg 4 times daily as needed to 50 mg 4 times daily as needed to help manage anxiety.  He is also agreeable to increasing Abilify 20 mg to start 30 mg daily to help manage depression.  Patient encouraged to take walks, socialize, and participate in activities at the [facility] such as Silver sneakers to help manage increased isolation.  He will continue all other medications as prescribed.  1. Generalized anxiety disorder  Increased- hydrOXYzine (ATARAX) 50 MG tablet; TAKE 1 TABLET BY MOUTH FOUR TIMES A DAY & TAKE 2 TABLETS EVERY NIGHT AT BEDTIME FOR ANXIETY, PANIC, AGITATION, AND SLEEP  Dispense: 120 tablet; Refill: 3 Continue- DULoxetine (CYMBALTA) 60 MG capsule; Take 1 capsule (60 mg total) by mouth 2 (two) times daily.  Dispense: 60 capsule; Refill: 3 Continue- propranolol (INDERAL) 10 MG tablet; Take 1 tablet (10 mg total) by mouth 3 (three) times daily.  Dispense: 90 tablet; Refill: 3  2. Bipolar I disorder, most recent episode depressed (HCC)  Increased- ARIPiprazole (ABILIFY) 30 MG tablet; Take 1 tablet (30 mg total) by mouth daily.  Dispense: 30 tablet; Refill: 3 Continue- buPROPion (WELLBUTRIN XL) 300 MG 24 hr tablet; Take 1 tablet (300 mg total) by mouth daily.  Dispense: 30 tablet; Refill: 3 Continue- buPROPion (WELLBUTRIN XL) 150 MG 24 hr tablet;  TAKE 1 TABLET BY MOUTH EVERY MORNING WITH 300MG  MG (450MG  TOTAL)  Dispense: 30 tablet; Refill: 3 Continue- DULoxetine (CYMBALTA) 60 MG capsule; Take 1 capsule (60 mg total) by mouth 2 (two) times daily.  Dispense: 60 capsule; Refill: 3 Continue- Oxcarbazepine (TRILEPTAL) 300 MG tablet; Take 1 tablet (300 mg total) by mouth 2 (two) times daily.  Dispense: 60 tablet; Refill: 3  3. Well adult health check  - Ambulatory referral to Internal Medicine  Follow-up in 3 months

## 2022-08-31 ENCOUNTER — Ambulatory Visit: Payer: Medicaid Other | Admitting: Physician Assistant

## 2022-08-31 ENCOUNTER — Encounter: Payer: Self-pay | Admitting: Physician Assistant

## 2022-08-31 VITALS — BP 136/81 | HR 99 | Ht 70.0 in | Wt 242.0 lb

## 2022-08-31 DIAGNOSIS — F331 Major depressive disorder, recurrent, moderate: Secondary | ICD-10-CM

## 2022-08-31 DIAGNOSIS — R251 Tremor, unspecified: Secondary | ICD-10-CM | POA: Diagnosis not present

## 2022-08-31 DIAGNOSIS — E782 Mixed hyperlipidemia: Secondary | ICD-10-CM

## 2022-08-31 DIAGNOSIS — F411 Generalized anxiety disorder: Secondary | ICD-10-CM | POA: Diagnosis not present

## 2022-08-31 DIAGNOSIS — F3130 Bipolar disorder, current episode depressed, mild or moderate severity, unspecified: Secondary | ICD-10-CM

## 2022-08-31 DIAGNOSIS — F4310 Post-traumatic stress disorder, unspecified: Secondary | ICD-10-CM | POA: Diagnosis not present

## 2022-08-31 DIAGNOSIS — Z23 Encounter for immunization: Secondary | ICD-10-CM

## 2022-08-31 DIAGNOSIS — I10 Essential (primary) hypertension: Secondary | ICD-10-CM | POA: Diagnosis not present

## 2022-08-31 DIAGNOSIS — Z1159 Encounter for screening for other viral diseases: Secondary | ICD-10-CM

## 2022-08-31 DIAGNOSIS — E1165 Type 2 diabetes mellitus with hyperglycemia: Secondary | ICD-10-CM

## 2022-08-31 DIAGNOSIS — Z125 Encounter for screening for malignant neoplasm of prostate: Secondary | ICD-10-CM

## 2022-08-31 DIAGNOSIS — Z114 Encounter for screening for human immunodeficiency virus [HIV]: Secondary | ICD-10-CM

## 2022-08-31 DIAGNOSIS — Z1211 Encounter for screening for malignant neoplasm of colon: Secondary | ICD-10-CM

## 2022-08-31 LAB — GLUCOSE, POCT (MANUAL RESULT ENTRY): Glucose Fasting, POC: 223 mg/dL — AB (ref 70–99)

## 2022-08-31 LAB — HEMOGLOBIN A1C: Hemoglobin A1C: 6.3

## 2022-08-31 MED ORDER — GLIMEPIRIDE 4 MG PO TABS
4.0000 mg | ORAL_TABLET | Freq: Two times a day (BID) | ORAL | 1 refills | Status: DC
Start: 2022-08-31 — End: 2023-02-18

## 2022-08-31 MED ORDER — ALPRAZOLAM 0.5 MG PO TABS
0.5000 mg | ORAL_TABLET | Freq: Three times a day (TID) | ORAL | 0 refills | Status: DC | PRN
Start: 2022-08-31 — End: 2022-09-28

## 2022-08-31 MED ORDER — ATORVASTATIN CALCIUM 20 MG PO TABS
20.0000 mg | ORAL_TABLET | Freq: Every day | ORAL | 1 refills | Status: DC
Start: 2022-08-31 — End: 2023-04-14

## 2022-08-31 MED ORDER — LISINOPRIL 20 MG PO TABS
20.0000 mg | ORAL_TABLET | Freq: Every day | ORAL | 1 refills | Status: DC
Start: 2022-08-31 — End: 2022-11-12

## 2022-08-31 MED ORDER — METFORMIN HCL 500 MG PO TABS
500.0000 mg | ORAL_TABLET | Freq: Two times a day (BID) | ORAL | 1 refills | Status: DC
Start: 2022-08-31 — End: 2023-02-16

## 2022-08-31 NOTE — Progress Notes (Signed)
Pt needs PCP. [name] [name] is previous PCP and they let him go. Pt has been out of medication and is going to be seen today to get refills.

## 2022-08-31 NOTE — Patient Instructions (Addendum)
I sent a refill of your medications to your pharmacy.  Please return to the mobile unit in the morning to have your fasting labs completed.  I sent a referral for gastroenterology for you to have a screening colonoscopy, they will reach out to you.  I sent a prescription for the shingles vaccine to your pharmacy, they will administer it there.  We will call you with your lab results when they are available.  I encourage you to return to the mobile unit in 1 month for follow-up  Roney Jaffe, PA-C Physician Assistant [facility] Medicine [email or web address]  Tremor A tremor is trembling or shaking that you cannot control. Most tremors affect the hands or arms. Tremors can also affect the head, vocal cords, face, and other parts of the body. There are many types of tremors. Common types include: Essential tremor. These usually occur in people older than 40. This type of tremor may run in families and can happen in otherwise healthy people. Resting tremor. These occur when the muscles are at rest, such as when your hands are resting in your lap. People with Parkinson's disease often have resting tremors. Postural tremor. These occur when you try to hold a pose, such as keeping your hands outstretched. Kinetic tremor. These occur during purposeful movement, such as trying to touch a finger to your nose. Task-specific tremor. These may occur when you do certain tasks such as writing, speaking, or standing. Psychogenic tremor. These are greatly reduced or go away when you are distracted. These tremors happen due to underlying stress or psychiatric disease. They can happen in people of all ages. Some types of tremors have no known cause. Tremors can also be a symptom of nervous system problems (neurological disorders) that may occur with aging. Some tremors go away with treatment, while others do not. Follow these instructions at home: Lifestyle      If you drink alcohol: Limit how much you have to: 0-1 drink a day for women who are not pregnant. 0-2 drinks a day for men. Know how much alcohol is in a drink. In the [place] one drink equals one 12 oz bottle of beer (355 mL), one 5 oz glass of wine (148 mL), or one 1 oz glass of hard liquor (44 mL). Do not use any products that contain nicotine or tobacco. These products include cigarettes, chewing tobacco, and vaping devices, such as e-cigarettes. If you need help quitting, ask your health care provider. Avoid extreme heat and extreme cold. Limit your caffeine intake, as told by your health care provider. Try to get 8 hours of sleep each night. Find ways to manage your stress, such as meditation or yoga. General instructions Take over-the-counter and prescription medicines only as told by your health care provider. Keep all follow-up visits. This is important. Contact a health care provider if: You develop a tremor after starting a new medicine. You have a tremor along with other symptoms such as: Numbness. Tingling. Pain. Weakness. Your tremor gets worse. Your tremor interferes with your day-to-day life. Summary A tremor is trembling or shaking that you cannot control. Most tremors affect the hands or arms. Some types of tremors have no known cause. Others may be a symptom of nervous system problems (neurological disorders). Make sure you discuss any tremors you have with your health care provider. This information is not intended to replace advice given to you by your health care provider. Make sure you discuss any questions you have with your health  care provider. Document Revised: 02/13/2021 Document Reviewed: 02/13/2021 Elsevier Patient Education  2023 [employer].

## 2022-08-31 NOTE — Progress Notes (Unsigned)
New Patient Office Visit  Subjective    Patient ID: [name] [name], male    DOB: Aug 24, 1965  Age: 57 y.o. MRN: [id number]  CC:  Chief Complaint  Patient presents with   Medication Refill    HPI [name] [name] states that he recently lost his insurance and is now on a different Medicaid plan and is unable to return to his previous primary care provider.  States that he does sees [place] for his psychiatric medications, except that his primary care provider did prescribe the Xanax for him.  States that he does use the Xanax 3 times a day, states that occasionally he will not need to use it during lunch.  Does endorse that his anxiety levels are elevated.  States that he also has been having increased depressed moods due to losing his job and not being able to find work.  Have thoughts that he would be better off dead but adamantly denies any thoughts of hurting himself.    States that his next follow-up with [name] [name] is in May.  States that he has been oversleeping, states that he does have a protocol when he wants to stay in bed all day that he will get up, I will wash his face and then eat breakfast and this generally helps.  States that he has been having tremors in both hands, states that they were present prior to increase in Abilify but does endorse that they became worse after Abilify was increased.     Outpatient Encounter Medications as of 08/31/2022  Medication Sig   ALPRAZolam (XANAX) 0.5 MG tablet Take 0.5 mg by mouth 3 (three) times daily as needed for anxiety. (Patient not taking: Reported on 08/31/2022)   ARIPiprazole (ABILIFY) 30 MG tablet Take 1 tablet (30 mg total) by mouth daily.   aspirin EC 81 MG tablet Take 81 mg by mouth daily.   atorvastatin (LIPITOR) 20 MG tablet Take 20 mg by mouth daily.   buPROPion (WELLBUTRIN XL) 150 MG 24 hr tablet TAKE 1 TABLET BY MOUTH EVERY MORNING WITH  MG (  TOTAL)   buPROPion (WELLBUTRIN XL) 300 MG 24 hr  tablet Take 1 tablet (300 mg total) by mouth daily.   cholecalciferol (VITAMIN D) 1000 units tablet Take 1,000 Units by mouth daily.   CINNAMON PO Take 1 capsule by mouth daily.   Cyanocobalamin (VITAMIN B-12 PO) Take 1 tablet by mouth daily.   DULoxetine (CYMBALTA) 20 MG capsule TAKE 1 CAPSULE BY MOUTH DAILY   DULoxetine (CYMBALTA) 60 MG capsule Take 1 capsule (60 mg total) by mouth 2 (two) times daily.   glimepiride (AMARYL) 4 MG tablet Take 4 mg by mouth 2 (two) times daily. (Patient not taking: Reported on 08/31/2022)   hydrOXYzine (ATARAX) 50 MG tablet TAKE 1 TABLET BY MOUTH FOUR TIMES A DAY & TAKE 2 TABLETS EVERY NIGHT AT BEDTIME FOR ANXIETY, PANIC, AGITATION, AND SLEEP   lisinopril (ZESTRIL) 20 MG tablet Take 1 tablet (20 mg total) by mouth daily.   metFORMIN (GLUCOPHAGE) 500 MG tablet Take 500 mg by mouth 2 (two) times daily. (Patient not taking: Reported on 08/31/2022)   Multiple Vitamin (MULTIVITAMIN) tablet Take 1 tablet by mouth daily.   Omega-3 1000 MG CAPS Take 1,000 mg by mouth daily.   omeprazole (PRILOSEC) 20 MG capsule Take 20 mg by mouth daily.   Oxcarbazepine (TRILEPTAL) 300 MG tablet Take 1 tablet (300 mg total) by mouth 2 (two) times daily.   pantoprazole (PROTONIX) 20  MG tablet Take 1 tablet (20 mg total) by mouth daily.   promethazine (PHENERGAN) 25 MG tablet Take 25 mg by mouth every 6 (six) hours as needed for nausea or vomiting.   propranolol (INDERAL) 10 MG tablet Take 1 tablet (10 mg total) by mouth 3 (three) times daily.   sildenafil (REVATIO) 20 MG tablet Take 20 mg by mouth 3 (three) times daily.   sucralfate (CARAFATE) 1 GM/10ML suspension Take 10 mLs (1 g total) by mouth 4 (four) times daily -  with meals and at bedtime.   No facility-administered encounter medications on file as of 08/31/2022.    Past Medical History:  Diagnosis Date   Anxiety    Chest heaviness    Chills    Depression, major    Diabetes mellitus without complication (HCC)    Dizzy     Dyslipidemia    ED (erectile dysfunction)    Elevated CPK    HA (headache)    Hypertension    Hypertriglyceridemia    Insomnia    Kidney stone    Left-sided Bell's palsy    Morbid obesity (HCC)    Nausea    No appetite    Panniculitis    of right lower leg   PTSD (post-traumatic stress disorder)    Weakness     Past Surgical History:  Procedure Laterality Date   NO PAST SURGERIES      Family History  Problem Relation Age of Onset   Thyroid disease Brother    Depression Brother    Heart disease Mother    Kidney failure Father    Diabetes Father    Thyroid disease Sister    Mental illness Sister    Heart attack Maternal Grandfather    Heart failure Paternal Grandmother    Cancer Paternal Grandfather        bone    Social History   Socioeconomic History   Marital status: Single    Spouse name: Not on file   Number of children: 0   Years of education: 12   Highest education level: Not on file  Occupational History   Not on file  Tobacco Use   Smoking status: Never   Smokeless tobacco: Never  Vaping Use   Vaping Use: Never used  Substance and Sexual Activity   Alcohol use: No   Drug use: No   Sexual activity: Not on file  Other Topics Concern   Not on file  Social History Narrative   Lives with brother in a one story home.  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    Social Determinants of Health   Financial Resource Strain: Not on file  Food Insecurity: No Food Insecurity (08/31/2022)   Hunger Vital Sign    Worried About Running Out of Food in the Last Year: Never true    Ran Out of Food in the Last Year: Never true  Transportation Needs: No Transportation Needs (08/31/2022)   PRAPARE - Administrator, Civil Service (Medical): No    Lack of Transportation (Non-Medical): No  Physical Activity: Not on file  Stress: Not on file  Social Connections: Not on file  Intimate Partner Violence: Not At Risk (08/31/2022)    Humiliation, Afraid, Rape, and Kick questionnaire    Fear of Current or Ex-Partner: No    Emotionally Abused: No    Physically Abused: No    Sexually Abused: No    Review of Systems  Constitutional: Negative.  HENT: Negative.    Eyes: Negative.   Respiratory:  Negative for shortness of breath.   Cardiovascular:  Negative for chest pain.  Gastrointestinal: Negative.   Genitourinary: Negative.   Musculoskeletal: Negative.   Skin: Negative.   Neurological:  Positive for tremors.  Endo/Heme/Allergies: Negative.   Psychiatric/Behavioral:  Positive for depression. The patient is nervous/anxious and has insomnia.         Objective    There were no vitals taken for this visit.  Physical Exam Vitals and nursing note reviewed.  Constitutional:      Appearance: Normal appearance.  HENT:     Head: Normocephalic and atraumatic.     Right Ear: External ear normal.     Left Ear: External ear normal.     Nose: Nose normal.     Mouth/Throat:     Mouth: Mucous membranes are moist.     Pharynx: Oropharynx is clear.  Eyes:     Extraocular Movements: Extraocular movements intact.     Conjunctiva/sclera: Conjunctivae normal.     Pupils: Pupils are equal, round, and reactive to light.  Cardiovascular:     Rate and Rhythm: Normal rate and regular rhythm.     Pulses: Normal pulses.     Heart sounds: Normal heart sounds.  Pulmonary:     Effort: Pulmonary effort is normal.     Breath sounds: Normal breath sounds.  Musculoskeletal:        General: Normal range of motion.     Cervical back: Normal range of motion and neck supple.  Skin:    General: Skin is warm and dry.  Neurological:     General: No focal deficit present.     Mental Status: He is alert and oriented to person, place, and time.     Motor: Tremor present.     Comments: Both hands  Psychiatric:        Attention and Perception: Attention normal.        Mood and Affect: Mood is anxious.        Speech: Speech normal.         Behavior: Behavior normal.        Thought Content: Thought content normal. Thought content does not include homicidal or suicidal plan.        Cognition and Memory: Cognition and memory normal.        Judgment: Judgment normal.         Assessment & Plan:   Problem List Items Addressed This Visit   None 1. Type 2 diabetes mellitus with hyperglycemia, without long-term current use of insulin Continue current regimen.  A1c 6.3.  Patient encouraged to check blood glucose levels, keep a written log and have available for all office visits.  Patient to return to mobile unit tomorrow morning to have fasting labs completed.  Patient to return the mobile unit in 1 month for follow-up. - glimepiride (AMARYL) 4 MG tablet; Take 1 tablet (4 mg total) by mouth 2 (two) times daily.  Dispense: 60 tablet; Refill: 1 - metFORMIN (GLUCOPHAGE) 500 MG tablet; Take 1 tablet (500 mg total) by mouth 2 (two) times daily.  Dispense: 60 tablet; Refill: 1  2. Essential hypertension Continue current regimen - CBC with Differential/Platelet; Future - Comp. Metabolic Panel (12); Future - lisinopril (ZESTRIL) 20 MG tablet; Take 1 tablet (20 mg total) by mouth daily.  Dispense: 30 tablet; Refill: 1 - CBC with Differential/Platelet - Comp. Metabolic Panel (12)  3. Mixed hyperlipidemia Continue current regimen -  Lipid panel; Future - atorvastatin (LIPITOR) 20 MG tablet; Take 1 tablet (20 mg total) by mouth daily.  Dispense: 30 tablet; Refill: 1 - Lipid panel  4. Tremor Patient wants to follow-up with [facility] regarding tremor.  Patient education given on supportive care - TSH; Future - TSH  5. Bipolar I disorder, most recent episode depressed Continue follow-up with Bradd Burner  6. Moderate episode of recurrent major depressive disorder  - Vitamin D, 25-hydroxy; Future - Vitamin D, 25-hydroxy  7. PTSD (post-traumatic stress disorder) Check of [place] controlled substance registry  appropriate. - ALPRAZolam (XANAX) 0.5 MG tablet; Take 1 tablet (0.5 mg total) by mouth 3 (three) times daily as needed for anxiety.  Dispense: 90 tablet; Refill: 0  8. Generalized anxiety disorder  - ALPRAZolam (XANAX) 0.5 MG tablet; Take 1 tablet (0.5 mg total) by mouth 3 (three) times daily as needed for anxiety.  Dispense: 90 tablet; Refill: 0  9. Screening PSA (prostate specific antigen)  - PSA; Future - PSA  10. Need for shingles vaccine  - Zoster Recombinant (Shingrix ); Future  11. Need for Tdap vaccination Tdap vaccination given in clinic - Tdap vaccine greater than or equal to 7yo IM  12. Special screening for malignant neoplasms, colon  - Ambulatory referral to Gastroenterology  13. Encounter for HCV screening test for low risk patient  - HCV Ab w Reflex to Quant PCR; Future - HCV Ab w Reflex to Quant PCR  14. Screening for HIV (human immunodeficiency virus)  - HIV antibody (with reflex); Future - HIV antibody (with reflex)   I have reviewed the patient's medical history (PMH, PSH, Social History, Family History, Medications, and allergies) , and have been updated if relevant. I spent 40 minutes reviewing chart and  face to face time with patient.   No follow-ups on file.   [name] Knudsen Mayers, PA-C

## 2022-09-01 ENCOUNTER — Ambulatory Visit: Payer: Medicaid Other

## 2022-09-01 ENCOUNTER — Encounter: Payer: Self-pay | Admitting: Physician Assistant

## 2022-09-01 DIAGNOSIS — R251 Tremor, unspecified: Secondary | ICD-10-CM | POA: Insufficient documentation

## 2022-09-01 DIAGNOSIS — I10 Essential (primary) hypertension: Secondary | ICD-10-CM | POA: Insufficient documentation

## 2022-09-01 DIAGNOSIS — E782 Mixed hyperlipidemia: Secondary | ICD-10-CM | POA: Insufficient documentation

## 2022-09-01 DIAGNOSIS — E1165 Type 2 diabetes mellitus with hyperglycemia: Secondary | ICD-10-CM | POA: Insufficient documentation

## 2022-09-01 NOTE — Progress Notes (Signed)
Patient presents to the Mobile for fasting blood work.

## 2022-09-03 LAB — CBC WITH DIFFERENTIAL/PLATELET
Basophils Absolute: 0.1 10*3/uL (ref 0.0–0.2)
Basos: 1 %
EOS (ABSOLUTE): 0.2 10*3/uL (ref 0.0–0.4)
Eos: 2 %
Hematocrit: 50.5 % (ref 37.5–51.0)
Hemoglobin: 16.9 g/dL (ref 13.0–17.7)
Immature Grans (Abs): 0.1 10*3/uL (ref 0.0–0.1)
Immature Granulocytes: 1 %
Lymphocytes Absolute: 2.9 10*3/uL (ref 0.7–3.1)
Lymphs: 36 %
MCH: 29.2 pg (ref 26.6–33.0)
MCHC: 33.5 g/dL (ref 31.5–35.7)
MCV: 87 fL (ref 79–97)
Monocytes Absolute: 0.5 10*3/uL (ref 0.1–0.9)
Monocytes: 7 %
Neutrophils Absolute: 4.2 10*3/uL (ref 1.4–7.0)
Neutrophils: 53 %
Platelets: 251 10*3/uL (ref 150–450)
RBC: 5.79 x10E6/uL (ref 4.14–5.80)
RDW: 13 % (ref 11.6–15.4)
WBC: 7.9 10*3/uL (ref 3.4–10.8)

## 2022-09-03 LAB — LIPID PANEL
Chol/HDL Ratio: 3.5 ratio (ref 0.0–5.0)
Cholesterol, Total: 161 mg/dL (ref 100–199)
HDL: 46 mg/dL
LDL Chol Calc (NIH): 87 mg/dL (ref 0–99)
Triglycerides: 160 mg/dL — ABNORMAL HIGH (ref 0–149)
VLDL Cholesterol Cal: 28 mg/dL (ref 5–40)

## 2022-09-03 LAB — COMP. METABOLIC PANEL (12)
AST: 32 IU/L (ref 0–40)
Albumin/Globulin Ratio: 1.8 (ref 1.2–2.2)
Albumin: 4.3 g/dL (ref 3.8–4.9)
Alkaline Phosphatase: 102 IU/L (ref 44–121)
BUN/Creatinine Ratio: 14 (ref 9–20)
BUN: 13 mg/dL (ref 6–24)
Bilirubin Total: 0.3 mg/dL (ref 0.0–1.2)
Calcium: 9.8 mg/dL (ref 8.7–10.2)
Chloride: 100 mmol/L (ref 96–106)
Creatinine, Ser: 0.96 mg/dL (ref 0.76–1.27)
Globulin, Total: 2.4 g/dL (ref 1.5–4.5)
Glucose: 235 mg/dL — ABNORMAL HIGH (ref 70–99)
Potassium: 4.2 mmol/L (ref 3.5–5.2)
Sodium: 141 mmol/L (ref 134–144)
Total Protein: 6.7 g/dL (ref 6.0–8.5)
eGFR: 92 mL/min/{1.73_m2} (ref >59)

## 2022-09-03 LAB — PSA: Prostate Specific Ag, Serum: 1.1 ng/mL (ref 0.0–4.0)

## 2022-09-03 LAB — HCV AB W REFLEX TO QUANT PCR: HCV Ab: NONREACTIVE

## 2022-09-03 LAB — HCV INTERPRETATION

## 2022-09-03 LAB — HIV ANTIBODY (ROUTINE TESTING W REFLEX): HIV Screen 4th Generation wRfx: NONREACTIVE

## 2022-09-03 LAB — TSH: TSH: 2.9 u[IU]/mL (ref 0.450–4.500)

## 2022-09-03 LAB — VITAMIN D 25 HYDROXY (VIT D DEFICIENCY, FRACTURES): Vit D, 25-Hydroxy: 29.1 ng/mL — ABNORMAL LOW (ref 30.0–100.0)

## 2022-09-20 ENCOUNTER — Encounter: Payer: Self-pay | Admitting: *Deleted

## 2022-09-20 NOTE — Progress Notes (Signed)
Pt attended 08/31/22 screening event where b/p was 106/89 and blood sugar was 223 and A1C was 6.3. At event, pt noted he no longer had PCP Donette Larry so pt was seen by Mobile unit [facility], PA-C to get refills for medicines along with preliminary assessment w/ labs on 09/01/22. Per C. Mayers notes, pt due to be seen at mobile unit in one month, and has future appt with his [facility] provider Sherril Cong, NP on 10/13/22 and appt to get established with Dr.Newlin as his PCP at [facility] on December 07, 2022.

## 2022-09-28 ENCOUNTER — Encounter: Payer: Self-pay | Admitting: Physician Assistant

## 2022-09-28 ENCOUNTER — Ambulatory Visit: Payer: Medicaid Other | Admitting: Physician Assistant

## 2022-09-28 VITALS — BP 124/79 | HR 63 | Ht 70.0 in | Wt 241.0 lb

## 2022-09-28 DIAGNOSIS — F411 Generalized anxiety disorder: Secondary | ICD-10-CM | POA: Diagnosis not present

## 2022-09-28 DIAGNOSIS — F4310 Post-traumatic stress disorder, unspecified: Secondary | ICD-10-CM | POA: Diagnosis not present

## 2022-09-28 DIAGNOSIS — M25511 Pain in right shoulder: Secondary | ICD-10-CM

## 2022-09-28 MED ORDER — ALPRAZOLAM 0.5 MG PO TABS
0.5000 mg | ORAL_TABLET | Freq: Three times a day (TID) | ORAL | 0 refills | Status: DC | PRN
Start: 2022-09-28 — End: 2022-11-09

## 2022-09-28 MED ORDER — KETOROLAC TROMETHAMINE 60 MG/2ML IM SOLN
60.0000 mg | Freq: Once | INTRAMUSCULAR | Status: AC
Start: 2022-09-28 — End: 2022-09-28
  Administered 2022-09-28: 60 mg via INTRAMUSCULAR

## 2022-09-28 MED ORDER — METHYLPREDNISOLONE ACETATE 80 MG/ML IJ SUSP
120.0000 mg | Freq: Once | INTRAMUSCULAR | Status: AC
Start: 2022-09-28 — End: 2022-09-28
  Administered 2022-09-28: 120 mg via INTRAMUSCULAR

## 2022-09-28 NOTE — Patient Instructions (Signed)
I encourage you to use ice and avoid sleeping on your right side to help with your shoulder pain, you can use Tylenol or ibuprofen, if you are using ibuprofen to help with the pain, I encourage you to wait at least 24 hours from the time that we gave you the injection today.  I hope that you feel better soon, please let us know if there is any else we can do.  Roney Jaffe, PA-C Physician Assistant [facility] Medicine [email or web address]   Shoulder Pain Many things can cause shoulder pain, including: An injury to the shoulder. Overuse of the shoulder. Arthritis. The source of the pain can be: Inflammation. An injury to the shoulder joint. An injury to a tendon, ligament, or bone. Follow these instructions at home: Pay attention to changes in your symptoms. Let your health care provider know about them. Follow these instructions to relieve your pain. If you have a removable sling: Wear the sling as told by your provider. Remove it only as told by your provider. Check the skin around the sling every day. Tell your provider about any concerns. Loosen the sling if your fingers tingle, become numb, or become cold. Keep the sling clean. If the sling is not waterproof: Do not let it get wet. Remove it to shower or bathe. Move your arm as little as possible, but keep your hand moving to prevent swelling. Managing pain, stiffness, and swelling  If told, put ice on the painful area. If you have a removable sling or immobilizer, remove it as told by your provider. Put ice in a plastic bag. Place a towel between your skin and the bag. Leave the ice on for 20 minutes, 2-3 times a day. If your skin turns bright red, remove the ice right away to prevent skin damage. The risk of damage is higher if you cannot feel pain, heat, or cold. Move your fingers often to reduce stiffness and swelling. Squeeze a soft ball or a foam pad as much as possible.  This helps to keep the shoulder from swelling. It also helps to strengthen the arm. General instructions Take over-the-counter and prescription medicines only as told by your provider. Exercise may help with pain management. Perform exercises if told by your provider. You may be referred to a physical therapist to help in your recovery process. Keep all follow-up visits in order to avoid any type of permanent shoulder disability or chronic pain problems. Contact a health care provider if: Your pain is not relieved with medicines. New pain develops in your arm, hand, or fingers. You loosen your sling and your arm, hand, or fingers remain tingly, numb, swollen, or painful. Get help right away if: Your arm, hand, or fingers turn white or blue. This information is not intended to replace advice given to you by your health care provider. Make sure you discuss any questions you have with your health care provider. Document Revised: 11/27/2021 Document Reviewed: 11/27/2021 Elsevier Patient Education  2023 [employer].

## 2022-09-28 NOTE — Progress Notes (Signed)
Established Patient Office Visit  Subjective   Patient ID: [name] [name], male    DOB: [birth year]  Age: 57 y.o. MRN: [id number]  Chief Complaint  Patient presents with   Follow-up   Medication Refill    Requests refill of Xanax and complains of right arm pain today.  States that he does have a primary care established visit upcoming on June 5.  States that he has not had follow-up with his behavioral health provider but expects to have an appointment with them soon.  States that he started experiencing right arm pain last week, states that the pain starts in his right shoulder and shoots down to his hand.  States that he does have pain with movement, states that he has experienced numbness and tingling as well.  Denies injury or trauma.  States that he has been using Tylenol and heat without much relief.    Past Medical History:  Diagnosis Date   Anxiety    Chest heaviness    Chills    Depression, major    Diabetes mellitus without complication (HCC)    Dizzy    Dyslipidemia    ED (erectile dysfunction)    Elevated CPK    HA (headache)    Hypertension    Hypertriglyceridemia    Insomnia    Kidney stone    Left-sided Bell's palsy    Morbid obesity (HCC)    Nausea    No appetite    Panniculitis    of right lower leg   PTSD (post-traumatic stress disorder)    Weakness    Social History   Socioeconomic History   Marital status: Single    Spouse name: Not on file   Number of children: 0   Years of education: 12   Highest education level: Not on file  Occupational History   Not on file  Tobacco Use   Smoking status: Never   Smokeless tobacco: Never  Vaping Use   Vaping Use: Never used  Substance and Sexual Activity   Alcohol use: No   Drug use: No   Sexual activity: Not on file  Other Topics Concern   Not on file  Social History Narrative   Lives with brother in a one story home.  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    Social  Determinants of Health   Financial Resource Strain: Not on file  Food Insecurity: No Food Insecurity (08/31/2022)   Hunger Vital Sign    Worried About Running Out of Food in the Last Year: Never true    Ran Out of Food in the Last Year: Never true  Transportation Needs: No Transportation Needs (08/31/2022)   PRAPARE - Administrator, Civil Service (Medical): No    Lack of Transportation (Non-Medical): No  Physical Activity: Not on file  Stress: Not on file  Social Connections: Not on file  Intimate Partner Violence: Not At Risk (08/31/2022)   Humiliation, Afraid, Rape, and Kick questionnaire    Fear of Current or Ex-Partner: No    Emotionally Abused: No    Physically Abused: No    Sexually Abused: No   Family History  Problem Relation Age of Onset   Thyroid disease Brother    Depression Brother    Heart disease Mother    Kidney failure Father    Diabetes Father    Thyroid disease Sister    Mental illness Sister    Heart attack Maternal Grandfather  Heart failure Paternal Grandmother    Cancer Paternal Grandfather        bone   No Known Allergies  Review of Systems  Constitutional: Negative.   HENT: Negative.    Eyes: Negative.   Respiratory:  Negative for shortness of breath.   Cardiovascular:  Negative for chest pain.  Gastrointestinal: Negative.   Genitourinary: Negative.   Musculoskeletal:  Positive for joint pain and myalgias.  Skin: Negative.   Neurological: Negative.   Endo/Heme/Allergies: Negative.   Psychiatric/Behavioral:  The patient is nervous/anxious.       Objective:     BP 124/79 (BP Location: Left Arm, Patient Position: Sitting, Cuff Size: Large)   Pulse 63   Ht 5\' 10"  (1.778 m)   Wt 241 lb (109.3 kg)   SpO2 96%   BMI 34.58 kg/m  BP Readings from Last 3 Encounters:  09/28/22 124/79  09/01/22 131/74  08/31/22 136/81   Wt Readings from Last 3 Encounters:  09/28/22 241 lb (109.3 kg)  09/01/22 242 lb (109.8 kg)  08/31/22 242  lb (109.8 kg)      Physical Exam Vitals and nursing note reviewed.  Constitutional:      Appearance: Normal appearance.  HENT:     Head: Normocephalic and atraumatic.     Right Ear: External ear normal.     Left Ear: External ear normal.     Nose: Nose normal.     Mouth/Throat:     Mouth: Mucous membranes are moist.     Pharynx: Oropharynx is clear.  Eyes:     Extraocular Movements: Extraocular movements intact.     Conjunctiva/sclera: Conjunctivae normal.     Pupils: Pupils are equal, round, and reactive to light.  Cardiovascular:     Rate and Rhythm: Normal rate and regular rhythm.     Pulses: Normal pulses.     Heart sounds: Normal heart sounds.  Pulmonary:     Effort: Pulmonary effort is normal.     Breath sounds: Normal breath sounds.  Musculoskeletal:     Right shoulder: Tenderness present. No swelling. Decreased range of motion. Decreased strength. Normal pulse.     Left shoulder: Normal.     Cervical back: Normal range of motion.     Comments: Pain elicited with range of motion testing.  Skin:    General: Skin is warm and dry.  Neurological:     General: No focal deficit present.     Mental Status: He is oriented to person, place, and time.     Motor: Tremor present.     Comments: Tremor bilateral hands  Psychiatric:        Mood and Affect: Mood normal.        Behavior: Behavior normal.        Thought Content: Thought content normal.        Judgment: Judgment normal.        Assessment & Plan:   Problem List Items Addressed This Visit       Other   PTSD (post-traumatic stress disorder)   Relevant Medications   ALPRAZolam (XANAX) 0.5 MG tablet   Generalized anxiety disorder - Primary   Relevant Medications   ALPRAZolam (XANAX) 0.5 MG tablet   Other Visit Diagnoses     Acute pain of right shoulder       Relevant Medications   ketorolac (TORADOL) injection 60 mg (Completed)   methylPREDNISolone acetate (DEPO-MEDROL) injection 120 mg (Completed)       1. Generalized anxiety disorder  Check of [place] controlled substance registry appropriate.  Courtesy refill given.  Patient was made aware that he would need to have all further refills completed by his new primary care provider or his behavioral health provider.  Patient understands and agrees. - ALPRAZolam (XANAX) 0.5 MG tablet; Take 1 tablet (0.5 mg total) by mouth 3 (three) times daily as needed for anxiety.  Dispense: 90 tablet; Refill: 0  2. PTSD (post-traumatic stress disorder)  - ALPRAZolam (XANAX) 0.5 MG tablet; Take 1 tablet (0.5 mg total) by mouth 3 (three) times daily as needed for anxiety.  Dispense: 90 tablet; Refill: 0  3. Acute pain of right shoulder Patient given injection Toradol and Depo-Medrol in clinic.  Patient education given on supportive care.  Red flags given for prompt reevaluation. - ketorolac (TORADOL) injection 60 mg - methylPREDNISolone acetate (DEPO-MEDROL) injection 120 mg   I have reviewed the patient's medical history (PMH, PSH, Social History, Family History, Medications, and allergies) , and have been updated if relevant. I spent 30 minutes reviewing chart and  face to face time with patient.    Return if symptoms worsen or fail to improve.    Kasandra Knudsen Mayers, PA-C

## 2022-10-13 ENCOUNTER — Encounter: Payer: Self-pay | Admitting: Family Medicine

## 2022-10-13 ENCOUNTER — Telehealth (HOSPITAL_COMMUNITY): Payer: Medicaid Other | Admitting: Psychiatry

## 2022-10-13 ENCOUNTER — Ambulatory Visit (INDEPENDENT_AMBULATORY_CARE_PROVIDER_SITE_OTHER): Payer: Medicaid Other | Admitting: Family Medicine

## 2022-10-13 VITALS — BP 112/62 | HR 59 | Temp 97.6°F | Ht 70.0 in | Wt 239.0 lb

## 2022-10-13 DIAGNOSIS — M25511 Pain in right shoulder: Secondary | ICD-10-CM | POA: Diagnosis not present

## 2022-10-13 DIAGNOSIS — F331 Major depressive disorder, recurrent, moderate: Secondary | ICD-10-CM | POA: Diagnosis not present

## 2022-10-13 DIAGNOSIS — F419 Anxiety disorder, unspecified: Secondary | ICD-10-CM | POA: Insufficient documentation

## 2022-10-13 DIAGNOSIS — Z7984 Long term (current) use of oral hypoglycemic drugs: Secondary | ICD-10-CM

## 2022-10-13 DIAGNOSIS — E1165 Type 2 diabetes mellitus with hyperglycemia: Secondary | ICD-10-CM | POA: Diagnosis not present

## 2022-10-13 MED ORDER — MELOXICAM 7.5 MG PO TABS: 7.5000 mg | ORAL_TABLET | Freq: Every day | ORAL | 0 refills | Status: DC

## 2022-10-13 NOTE — Patient Instructions (Signed)
It was great to meet you today and I'm excited to have you join the [facility] practice. I hope you had a positive experience today! If you feel so inclined, please feel free to recommend our practice to friends and family. Treyon Wymore, [name]-C  

## 2022-10-13 NOTE — Assessment & Plan Note (Signed)
Well controlled. Last A1c 6.3.

## 2022-10-13 NOTE — Assessment & Plan Note (Signed)
GeneSight testing done today. He is seeing psychiatry tomorrow. Declines therapy. Denies SI/HI.

## 2022-10-13 NOTE — Progress Notes (Signed)
New Patient Office Visit  Subjective    Patient ID: [name] [name], male    DOB: 04-02-1966  Age: 57 y.o. MRN: [id number]  CC:  Chief Complaint  Patient presents with   Establish Care    GAD score of 21, PHQ-9 score of 23    HPI [name] [name] presents to establish care. Oriented to practice routines and expectations. PMH as below. He reports his anger issues, anxiety, and depression are uncontrolled, he does feel better since his medication adjustments in march, he reports feeling "at ease". He does see a behavioral health specialist every 3 months. Denies SI/HI.   He reports numbness and tingling in his right shoulder for 3 days. He has a history of a fall 1 years ago onto his back. His right arm from his posterior shoulder down his arm has throbbing pain and weakness. He was treated for this on 5/21 and treated with steroid injection and toradol without relief.  Outpatient Encounter Medications as of 10/13/2022  Medication Sig   ARIPiprazole (ABILIFY) 30 MG tablet Take 1 tablet (30 mg total) by mouth daily.   atorvastatin (LIPITOR) 20 MG tablet Take 1 tablet (20 mg total) by mouth daily.   buPROPion (WELLBUTRIN XL) 150 MG 24 hr tablet TAKE 1 TABLET BY MOUTH EVERY MORNING WITH 300MG  MG (450MG  TOTAL)   buPROPion (WELLBUTRIN XL) 300 MG 24 hr tablet Take 1 tablet (300 mg total) by mouth daily.   DULoxetine (CYMBALTA) 20 MG capsule TAKE 1 CAPSULE BY MOUTH DAILY   DULoxetine (CYMBALTA) 60 MG capsule Take 1 capsule (60 mg total) by mouth 2 (two) times daily.   glimepiride (AMARYL) 4 MG tablet Take 1 tablet (4 mg total) by mouth 2 (two) times daily.   hydrOXYzine (ATARAX) 50 MG tablet TAKE 1 TABLET BY MOUTH FOUR TIMES A DAY & TAKE 2 TABLETS EVERY NIGHT AT BEDTIME FOR ANXIETY, PANIC, AGITATION, AND SLEEP   lisinopril (ZESTRIL) 20 MG tablet Take 1 tablet (20 mg total) by mouth daily.   meloxicam (MOBIC) 7.5 MG tablet Take 1 tablet (7.5 mg total) by mouth daily.   metFORMIN (GLUCOPHAGE) 500  MG tablet Take 1 tablet (500 mg total) by mouth 2 (two) times daily.   Oxcarbazepine (TRILEPTAL) 300 MG tablet Take 1 tablet (300 mg total) by mouth 2 (two) times daily.   propranolol (INDERAL) 10 MG tablet Take 1 tablet (10 mg total) by mouth 3 (three) times daily.   ALPRAZolam (XANAX) 0.5 MG tablet Take 1 tablet (0.5 mg total) by mouth 3 (three) times daily as needed for anxiety.   aspirin EC 81 MG tablet Take 81 mg by mouth daily.   cholecalciferol (VITAMIN D) 1000 units tablet Take 1,000 Units by mouth daily. (Patient not taking: Reported on 10/13/2022)   CINNAMON PO Take 1 capsule by mouth daily. (Patient not taking: Reported on 10/13/2022)   Cyanocobalamin (VITAMIN B-12 PO) Take 1 tablet by mouth daily. (Patient not taking: Reported on 10/13/2022)   Multiple Vitamin (MULTIVITAMIN) tablet Take 1 tablet by mouth daily. (Patient not taking: Reported on 10/13/2022)   Omega-3 1000 MG CAPS Take 1,000 mg by mouth daily. (Patient not taking: Reported on 10/13/2022)   omeprazole (PRILOSEC) 20 MG capsule Take 20 mg by mouth daily. (Patient not taking: Reported on 10/13/2022)   pantoprazole (PROTONIX) 20 MG tablet Take 1 tablet (20 mg total) by mouth daily. (Patient not taking: Reported on 10/13/2022)   promethazine (PHENERGAN) 25 MG tablet Take 25 mg by mouth every  6 (six) hours as needed for nausea or vomiting. (Patient not taking: Reported on 10/13/2022)   sildenafil (REVATIO) 20 MG tablet Take 20 mg by mouth 3 (three) times daily. (Patient not taking: Reported on 10/13/2022)   sucralfate (CARAFATE) 1 GM/10ML suspension Take 10 mLs (1 g total) by mouth 4 (four) times daily -  with meals and at bedtime. (Patient not taking: Reported on 10/13/2022)   No facility-administered encounter medications on file as of 10/13/2022.    Past Medical History:  Diagnosis Date   Anxiety    Chest heaviness    Chills    Depression, major    Diabetes mellitus without complication (HCC)    Dizzy    Dyslipidemia    ED (erectile  dysfunction)    Elevated CPK    HA (headache)    Hypertension    Hypertriglyceridemia    Insomnia    Kidney stone    Left-sided Bell's palsy    Morbid obesity (HCC)    Nausea    No appetite    Panniculitis    of right lower leg   PTSD (post-traumatic stress disorder)    Weakness     Past Surgical History:  Procedure Laterality Date   NO PAST SURGERIES      Family History  Problem Relation Age of Onset   Thyroid disease Brother    Depression Brother    Heart disease Mother    Kidney failure Father    Diabetes Father    Thyroid disease Sister    Mental illness Sister    Heart attack Maternal Grandfather    Heart failure Paternal Grandmother    Cancer Paternal Grandfather        bone    Social History   Socioeconomic History   Marital status: Single    Spouse name: Not on file   Number of children: 0   Years of education: 12   Highest education level: Not on file  Occupational History   Not on file  Tobacco Use   Smoking status: Never   Smokeless tobacco: Never  Vaping Use   Vaping Use: Never used  Substance and Sexual Activity   Alcohol use: No   Drug use: No   Sexual activity: Not on file  Other Topics Concern   Not on file  Social History Narrative   Lives with brother in a one story home.  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    Social Determinants of Health   Financial Resource Strain: Not on file  Food Insecurity: No Food Insecurity (08/31/2022)   Hunger Vital Sign    Worried About Running Out of Food in the Last Year: Never true    Ran Out of Food in the Last Year: Never true  Transportation Needs: No Transportation Needs (08/31/2022)   PRAPARE - Administrator, Civil Service (Medical): No    Lack of Transportation (Non-Medical): No  Physical Activity: Not on file  Stress: Not on file  Social Connections: Not on file  Intimate Partner Violence: Not At Risk (08/31/2022)   Humiliation, Afraid, Rape, and Kick  questionnaire    Fear of Current or Ex-Partner: No    Emotionally Abused: No    Physically Abused: No    Sexually Abused: No    Review of Systems  All other systems reviewed and are negative.       Objective    BP 112/62   Pulse (!) 59   Temp  97.6 F (36.4 C)   Ht 5\' 10"  (1.778 m)   Wt 239 lb (108.4 kg)   SpO2 100%   BMI 34.29 kg/m   Physical Exam Vitals and nursing note reviewed.  Constitutional:      Appearance: Normal appearance. He is normal weight.  HENT:     Head: Normocephalic and atraumatic.  Skin:    General: Skin is warm and dry.     Capillary Refill: Capillary refill takes less than 2 seconds.  Neurological:     General: No focal deficit present.     Mental Status: He is alert and oriented to person, place, and time. Mental status is at baseline.     Motor: Tremor present.  Psychiatric:        Mood and Affect: Mood normal. Affect is flat.        Behavior: Behavior normal.        Thought Content: Thought content normal.        Judgment: Judgment normal.         Assessment & Plan:   Problem List Items Addressed This Visit     Moderate episode of recurrent major depressive disorder (HCC)   Type 2 diabetes mellitus with hyperglycemia, without long-term current use of insulin (HCC)    Well controlled. Last A1c 6.3.      Relevant Orders   Microalbumin / creatinine urine ratio   Anxiety    GeneSight testing done today. He is seeing psychiatry tomorrow. Declines therapy. Denies SI/HI.      Relevant Orders   DRUG MONITOR, PANEL 1, W/CONF, URINE   Acute pain of right shoulder - Primary    Positive neer impingement. Will refer for PT and ortho. Start Mobic 7.5mg  daily.      Relevant Orders   Ambulatory referral to Orthopedics   Ambulatory referral to Physical Therapy    Return in about 3 months (around 01/13/2023) for chronic follow-up with labs 1 week prior.   Park Meo, [name]

## 2022-10-13 NOTE — Assessment & Plan Note (Signed)
Positive neer impingement. Will refer for PT and ortho. Start Mobic 7.5mg  daily.

## 2022-10-14 LAB — MICROALBUMIN / CREATININE URINE RATIO
Creatinine, Urine: 338 mg/dL — ABNORMAL HIGH (ref 20–320)
Microalb Creat Ratio: 17 mg/g creat (ref <30)
Microalb, Ur: 5.9 mg/dL

## 2022-10-15 LAB — DRUG MONITOR, PANEL 1, W/CONF, URINE
Alphahydroxyalprazolam: 499 ng/mL — ABNORMAL HIGH (ref <25)
Alphahydroxymidazolam: NEGATIVE ng/mL (ref <50)
Alphahydroxytriazolam: NEGATIVE ng/mL (ref <50)
Aminoclonazepam: NEGATIVE ng/mL (ref <25)
Amphetamine: NEGATIVE ng/mL (ref <250)
Amphetamines: NEGATIVE ng/mL (ref <500)
Barbiturates: NEGATIVE ng/mL (ref <300)
Benzodiazepines: POSITIVE ng/mL — AB (ref <100)
Cocaine Metabolite: NEGATIVE ng/mL (ref <150)
Codeine: NEGATIVE ng/mL (ref <50)
Creatinine: >300 mg/dL (ref >=20.0)
Hydrocodone: NEGATIVE ng/mL (ref <50)
Hydromorphone: NEGATIVE ng/mL (ref <50)
Hydroxyethylflurazepam: NEGATIVE ng/mL (ref <50)
Lorazepam: NEGATIVE ng/mL (ref <50)
Marijuana Metabolite: NEGATIVE ng/mL (ref <20)
Methadone Metabolite: NEGATIVE ng/mL (ref <100)
Methamphetamine: NEGATIVE ng/mL (ref <250)
Morphine: NEGATIVE ng/mL (ref <50)
Nordiazepam: NEGATIVE ng/mL (ref <50)
Norhydrocodone: NEGATIVE ng/mL (ref <50)
Opiates: NEGATIVE ng/mL (ref <100)
Oxazepam: NEGATIVE ng/mL (ref <50)
Oxidant: NEGATIVE ug/mL (ref <200)
Oxycodone: NEGATIVE ng/mL (ref <100)
Phencyclidine: NEGATIVE ng/mL (ref <25)
Phencyclidine: NEGATIVE ng/mL (ref <25)
Temazepam: NEGATIVE ng/mL (ref <50)
pH: 5.9 (ref 4.5–9.0)

## 2022-10-15 LAB — DM TEMPLATE

## 2022-10-18 ENCOUNTER — Telehealth (HOSPITAL_COMMUNITY): Payer: No Typology Code available for payment source | Admitting: Psychiatry

## 2022-10-18 ENCOUNTER — Encounter (HOSPITAL_COMMUNITY): Payer: Self-pay

## 2022-11-05 ENCOUNTER — Encounter (HOSPITAL_COMMUNITY): Payer: Self-pay

## 2022-11-05 ENCOUNTER — Other Ambulatory Visit: Payer: Self-pay

## 2022-11-05 ENCOUNTER — Inpatient Hospital Stay (HOSPITAL_COMMUNITY)
Admission: EM | Admit: 2022-11-05 | Discharge: 2022-11-09 | DRG: 092 | Disposition: A | Discharge status: Home / Self Care | Payer: Medicaid Other | Attending: Internal Medicine | Admitting: Internal Medicine

## 2022-11-05 DIAGNOSIS — F329 Major depressive disorder, single episode, unspecified: Secondary | ICD-10-CM | POA: Diagnosis present

## 2022-11-05 DIAGNOSIS — G253 Myoclonus: Principal | ICD-10-CM | POA: Diagnosis present

## 2022-11-05 DIAGNOSIS — E781 Pure hyperglyceridemia: Secondary | ICD-10-CM | POA: Diagnosis present

## 2022-11-05 DIAGNOSIS — T43595A Adverse effect of other antipsychotics and neuroleptics, initial encounter: Secondary | ICD-10-CM | POA: Diagnosis present

## 2022-11-05 DIAGNOSIS — G20A1 Parkinson's disease without dyskinesia, without mention of fluctuations: Secondary | ICD-10-CM | POA: Diagnosis present

## 2022-11-05 DIAGNOSIS — Z7982 Long term (current) use of aspirin: Secondary | ICD-10-CM

## 2022-11-05 DIAGNOSIS — G510 Bell's palsy: Secondary | ICD-10-CM | POA: Diagnosis present

## 2022-11-05 DIAGNOSIS — T43295A Adverse effect of other antidepressants, initial encounter: Secondary | ICD-10-CM | POA: Diagnosis present

## 2022-11-05 DIAGNOSIS — F3130 Bipolar disorder, current episode depressed, mild or moderate severity, unspecified: Secondary | ICD-10-CM | POA: Diagnosis present

## 2022-11-05 DIAGNOSIS — Z833 Family history of diabetes mellitus: Secondary | ICD-10-CM

## 2022-11-05 DIAGNOSIS — R270 Ataxia, unspecified: Secondary | ICD-10-CM

## 2022-11-05 DIAGNOSIS — F4310 Post-traumatic stress disorder, unspecified: Secondary | ICD-10-CM | POA: Diagnosis present

## 2022-11-05 DIAGNOSIS — F419 Anxiety disorder, unspecified: Secondary | ICD-10-CM | POA: Diagnosis present

## 2022-11-05 DIAGNOSIS — Z7984 Long term (current) use of oral hypoglycemic drugs: Secondary | ICD-10-CM

## 2022-11-05 DIAGNOSIS — E119 Type 2 diabetes mellitus without complications: Secondary | ICD-10-CM | POA: Diagnosis present

## 2022-11-05 DIAGNOSIS — Z79899 Other long term (current) drug therapy: Secondary | ICD-10-CM

## 2022-11-05 DIAGNOSIS — Z818 Family history of other mental and behavioral disorders: Secondary | ICD-10-CM

## 2022-11-05 DIAGNOSIS — R278 Other lack of coordination: Secondary | ICD-10-CM | POA: Diagnosis present

## 2022-11-05 DIAGNOSIS — I10 Essential (primary) hypertension: Secondary | ICD-10-CM | POA: Diagnosis present

## 2022-11-05 DIAGNOSIS — G4700 Insomnia, unspecified: Secondary | ICD-10-CM | POA: Diagnosis present

## 2022-11-05 HISTORY — DX: Parkinson's disease without dyskinesia, without mention of fluctuations: G20.A1

## 2022-11-05 LAB — CBC
HCT: 50.8 % (ref 39.0–52.0)
Hemoglobin: 16.9 g/dL (ref 13.0–17.0)
MCH: 28.2 pg (ref 26.0–34.0)
MCHC: 33.3 g/dL (ref 30.0–36.0)
MCV: 84.8 fL (ref 80.0–100.0)
Platelets: 336 10*3/uL (ref 150–400)
RBC: 5.99 MIL/uL — ABNORMAL HIGH (ref 4.22–5.81)
RDW: 12.5 % (ref 11.5–15.5)
WBC: 12.3 10*3/uL — ABNORMAL HIGH (ref 4.0–10.5)
nRBC: 0 % (ref 0.0–0.2)

## 2022-11-05 LAB — COMPREHENSIVE METABOLIC PANEL
ALT: 25 U/L (ref 0–44)
AST: 20 U/L (ref 15–41)
Albumin: 4.3 g/dL (ref 3.5–5.0)
Alkaline Phosphatase: 85 U/L (ref 38–126)
Anion gap: 19 — ABNORMAL HIGH (ref 5–15)
BUN: 29 mg/dL — ABNORMAL HIGH (ref 6–20)
CO2: 24 mmol/L (ref 22–32)
Calcium: 10 mg/dL (ref 8.9–10.3)
Chloride: 97 mmol/L — ABNORMAL LOW (ref 98–111)
Creatinine, Ser: 1.44 mg/dL — ABNORMAL HIGH (ref 0.61–1.24)
GFR, Estimated: 57 mL/min — ABNORMAL LOW (ref >60)
Glucose, Bld: 133 mg/dL — ABNORMAL HIGH (ref 70–99)
Potassium: 4.5 mmol/L (ref 3.5–5.1)
Sodium: 140 mmol/L (ref 135–145)
Total Bilirubin: 0.9 mg/dL (ref 0.3–1.2)
Total Protein: 7.8 g/dL (ref 6.5–8.1)

## 2022-11-05 NOTE — ED Triage Notes (Signed)
Pt arrived from home via [facility] c/o tremors and a new jerking. Pt recently diagnosed with Parkinson's and states that hwe is ok with the tremors just not the new jerking that he is experiencing.

## 2022-11-06 DIAGNOSIS — G4700 Insomnia, unspecified: Secondary | ICD-10-CM | POA: Diagnosis present

## 2022-11-06 DIAGNOSIS — F3130 Bipolar disorder, current episode depressed, mild or moderate severity, unspecified: Secondary | ICD-10-CM | POA: Diagnosis present

## 2022-11-06 DIAGNOSIS — Z7982 Long term (current) use of aspirin: Secondary | ICD-10-CM | POA: Diagnosis not present

## 2022-11-06 DIAGNOSIS — E781 Pure hyperglyceridemia: Secondary | ICD-10-CM | POA: Diagnosis present

## 2022-11-06 DIAGNOSIS — Z79899 Other long term (current) drug therapy: Secondary | ICD-10-CM | POA: Diagnosis not present

## 2022-11-06 DIAGNOSIS — R278 Other lack of coordination: Secondary | ICD-10-CM | POA: Diagnosis present

## 2022-11-06 DIAGNOSIS — Z7984 Long term (current) use of oral hypoglycemic drugs: Secondary | ICD-10-CM | POA: Diagnosis not present

## 2022-11-06 DIAGNOSIS — G253 Myoclonus: Secondary | ICD-10-CM | POA: Diagnosis present

## 2022-11-06 DIAGNOSIS — R270 Ataxia, unspecified: Secondary | ICD-10-CM

## 2022-11-06 DIAGNOSIS — T43295A Adverse effect of other antidepressants, initial encounter: Secondary | ICD-10-CM | POA: Diagnosis present

## 2022-11-06 DIAGNOSIS — F419 Anxiety disorder, unspecified: Secondary | ICD-10-CM | POA: Diagnosis present

## 2022-11-06 DIAGNOSIS — F4310 Post-traumatic stress disorder, unspecified: Secondary | ICD-10-CM | POA: Diagnosis present

## 2022-11-06 DIAGNOSIS — G20A1 Parkinson's disease without dyskinesia, without mention of fluctuations: Secondary | ICD-10-CM | POA: Diagnosis present

## 2022-11-06 DIAGNOSIS — T43595A Adverse effect of other antipsychotics and neuroleptics, initial encounter: Secondary | ICD-10-CM | POA: Diagnosis present

## 2022-11-06 DIAGNOSIS — I10 Essential (primary) hypertension: Secondary | ICD-10-CM | POA: Diagnosis present

## 2022-11-06 DIAGNOSIS — F319 Bipolar disorder, unspecified: Secondary | ICD-10-CM | POA: Diagnosis not present

## 2022-11-06 DIAGNOSIS — E119 Type 2 diabetes mellitus without complications: Secondary | ICD-10-CM | POA: Diagnosis present

## 2022-11-06 DIAGNOSIS — Z818 Family history of other mental and behavioral disorders: Secondary | ICD-10-CM | POA: Diagnosis not present

## 2022-11-06 DIAGNOSIS — Z833 Family history of diabetes mellitus: Secondary | ICD-10-CM | POA: Diagnosis not present

## 2022-11-06 DIAGNOSIS — G510 Bell's palsy: Secondary | ICD-10-CM | POA: Diagnosis present

## 2022-11-06 LAB — URINALYSIS, ROUTINE W REFLEX MICROSCOPIC
Bilirubin Urine: NEGATIVE
Glucose, UA: NEGATIVE mg/dL
Hgb urine dipstick: NEGATIVE
Ketones, ur: NEGATIVE mg/dL
Leukocytes,Ua: NEGATIVE
Nitrite: NEGATIVE
Protein, ur: 30 mg/dL — AB
Specific Gravity, Urine: 1.024 (ref 1.005–1.030)
pH: 5 (ref 5.0–8.0)

## 2022-11-06 LAB — AMMONIA: Ammonia: 17 umol/L (ref 9–35)

## 2022-11-06 LAB — GLUCOSE, CAPILLARY
Glucose-Capillary: 138 mg/dL — ABNORMAL HIGH (ref 70–99)
Glucose-Capillary: 126 mg/dL — ABNORMAL HIGH (ref 70–99)
Glucose-Capillary: 132 mg/dL — ABNORMAL HIGH (ref 70–99)
Glucose-Capillary: 106 mg/dL — ABNORMAL HIGH (ref 70–99)

## 2022-11-06 LAB — MAGNESIUM: Magnesium: 1.7 mg/dL (ref 1.7–2.4)

## 2022-11-06 LAB — CK: Total CK: 154 U/L (ref 49–397)

## 2022-11-06 MED ORDER — ATORVASTATIN CALCIUM 10 MG PO TABS
20.0000 mg | ORAL_TABLET | Freq: Every day | ORAL | Status: DC
  Administered 2022-11-06 – 2022-11-09 (×4): 20 mg via ORAL
  Filled 2022-11-06 (×4): qty 2

## 2022-11-06 MED ORDER — SODIUM CHLORIDE 0.9 % IV BOLUS
1000.0000 mL | Freq: Once | INTRAVENOUS | Status: AC
  Administered 2022-11-06: 1000 mL via INTRAVENOUS

## 2022-11-06 MED ORDER — DIVALPROEX SODIUM 250 MG PO DR TAB
250.0000 mg | DELAYED_RELEASE_TABLET | Freq: Two times a day (BID) | ORAL | Status: DC
  Administered 2022-11-06 – 2022-11-09 (×7): 250 mg via ORAL
  Filled 2022-11-06 (×9): qty 1

## 2022-11-06 MED ORDER — OXCARBAZEPINE 300 MG PO TABS
300.0000 mg | ORAL_TABLET | Freq: Two times a day (BID) | ORAL | Status: DC
  Administered 2022-11-06: 300 mg via ORAL
  Filled 2022-11-06 (×2): qty 1

## 2022-11-06 MED ORDER — BUPROPION HCL ER (XL) 150 MG PO TB24
150.0000 mg | ORAL_TABLET | Freq: Every day | ORAL | Status: DC
  Administered 2022-11-06: 150 mg via ORAL
  Filled 2022-11-06: qty 1

## 2022-11-06 MED ORDER — PROPRANOLOL HCL 10 MG PO TABS
10.0000 mg | ORAL_TABLET | Freq: Three times a day (TID) | ORAL | Status: DC
  Administered 2022-11-06 – 2022-11-09 (×8): 10 mg via ORAL
  Filled 2022-11-06 (×9): qty 1

## 2022-11-06 MED ORDER — HYDROXYZINE HCL 25 MG PO TABS: 100.0000 mg | ORAL_TABLET | Freq: Every day | ORAL | Status: DC

## 2022-11-06 MED ORDER — GLIMEPIRIDE 4 MG PO TABS
4.0000 mg | ORAL_TABLET | Freq: Two times a day (BID) | ORAL | Status: DC
  Administered 2022-11-06 (×2): 4 mg via ORAL
  Filled 2022-11-06 (×3): qty 1

## 2022-11-06 MED ORDER — INSULIN ASPART 100 UNIT/ML IJ SOLN: 0.0000 [IU] | Freq: Three times a day (TID) | INTRAMUSCULAR | Status: DC

## 2022-11-06 MED ORDER — METFORMIN HCL 500 MG PO TABS
1000.0000 mg | ORAL_TABLET | Freq: Two times a day (BID) | ORAL | Status: DC
  Administered 2022-11-06 (×2): 1000 mg via ORAL
  Filled 2022-11-06 (×2): qty 2

## 2022-11-06 MED ORDER — DULOXETINE HCL 20 MG PO CPEP
20.0000 mg | ORAL_CAPSULE | Freq: Every day | ORAL | Status: DC
  Administered 2022-11-06 – 2022-11-09 (×4): 20 mg via ORAL
  Filled 2022-11-06 (×4): qty 1

## 2022-11-06 MED ORDER — HYDROXYZINE HCL 25 MG PO TABS
50.0000 mg | ORAL_TABLET | ORAL | Status: DC
  Administered 2022-11-06: 50 mg via ORAL
  Filled 2022-11-06: qty 2

## 2022-11-06 MED ORDER — VALBENAZINE TOSYLATE 60 MG PO CAPS
60.0000 mg | ORAL_CAPSULE | Freq: Every day | ORAL | Status: DC
  Administered 2022-11-06 – 2022-11-08 (×3): 60 mg via ORAL
  Filled 2022-11-06 (×4): qty 1

## 2022-11-06 MED ORDER — ARIPIPRAZOLE 10 MG PO TABS: 30.0000 mg | ORAL_TABLET | Freq: Every day | ORAL | Status: DC

## 2022-11-06 MED ORDER — ASPIRIN 81 MG PO TBEC
81.0000 mg | DELAYED_RELEASE_TABLET | Freq: Every day | ORAL | Status: DC
  Administered 2022-11-06 – 2022-11-09 (×4): 81 mg via ORAL
  Filled 2022-11-06 (×4): qty 1

## 2022-11-06 MED ORDER — SODIUM CHLORIDE 0.9 % IV SOLN: INTRAVENOUS | Status: DC

## 2022-11-06 MED ORDER — LISINOPRIL 20 MG PO TABS
20.0000 mg | ORAL_TABLET | Freq: Every day | ORAL | Status: DC
  Administered 2022-11-06 – 2022-11-09 (×4): 20 mg via ORAL
  Filled 2022-11-06 (×4): qty 1

## 2022-11-06 MED ORDER — METFORMIN HCL 500 MG PO TABS
500.0000 mg | ORAL_TABLET | Freq: Two times a day (BID) | ORAL | Status: DC
  Filled 2022-11-06: qty 1

## 2022-11-06 MED ORDER — DIAZEPAM 5 MG/ML IJ SOLN
5.0000 mg | Freq: Once | INTRAMUSCULAR | Status: AC
  Administered 2022-11-06: 5 mg via INTRAVENOUS
  Filled 2022-11-06: qty 2

## 2022-11-06 MED ORDER — BUPROPION HCL ER (XL) 150 MG PO TB24: 300.0000 mg | ORAL_TABLET | Freq: Every day | ORAL | Status: DC

## 2022-11-06 MED ORDER — CLONAZEPAM 0.5 MG PO TABS
1.0000 mg | ORAL_TABLET | Freq: Three times a day (TID) | ORAL | Status: DC
  Administered 2022-11-06 – 2022-11-09 (×9): 1 mg via ORAL
  Filled 2022-11-06 (×9): qty 2

## 2022-11-06 MED ORDER — ENOXAPARIN SODIUM 40 MG/0.4ML IJ SOSY
40.0000 mg | PREFILLED_SYRINGE | INTRAMUSCULAR | Status: DC
  Administered 2022-11-06 – 2022-11-08 (×3): 40 mg via SUBCUTANEOUS
  Filled 2022-11-06 (×3): qty 0.4

## 2022-11-06 MED ORDER — ARIPIPRAZOLE 10 MG PO TABS
20.0000 mg | ORAL_TABLET | Freq: Every day | ORAL | Status: DC
  Administered 2022-11-06: 20 mg via ORAL
  Filled 2022-11-06: qty 2

## 2022-11-06 MED ORDER — HYDROXYZINE HCL 25 MG PO TABS
25.0000 mg | ORAL_TABLET | Freq: Three times a day (TID) | ORAL | Status: DC
  Administered 2022-11-06 – 2022-11-09 (×9): 25 mg via ORAL
  Filled 2022-11-06 (×9): qty 1

## 2022-11-06 MED ORDER — ALPRAZOLAM 0.5 MG PO TABS
0.5000 mg | ORAL_TABLET | Freq: Three times a day (TID) | ORAL | Status: DC | PRN
  Filled 2022-11-06: qty 1

## 2022-11-06 NOTE — ED Notes (Signed)
ED TO INPATIENT HANDOFF REPORT  ED Nurse Name and Phone #:  Johnna Acosta [phone]  S Name/Age/Gender [name] [name] 57 y.o. male Room/Bed: H012C/H012C  Code Status   Code Status: Not on file  Home/SNF/Other Home Patient oriented to: self, place, time, and situation Is this baseline? 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 Pt recently diagnosed with Parkinson's and states that hwe is ok with the tremors just not the new jerking that he is experiencing.    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]  Admitting Physician: Darlin Drop [[id number]]  Attending Physician: Darlin Drop [[id number]]  Certification:: I certify this patient will need inpatient services for at least 2 midnights  Estimated Length of Stay: 2          B Medical/Surgery History Past Medical History:  Diagnosis Date   Anxiety    Chest heaviness    Chills    Depression, major    Diabetes mellitus without complication (HCC)    Dizzy    Dyslipidemia    ED (erectile dysfunction)    Elevated CPK    HA (headache)    Hypertension    Hypertriglyceridemia    Insomnia    Kidney stone    Left-sided Bell's palsy    Morbid obesity (HCC)    Nausea    No appetite    Panniculitis    of right lower leg   Parkinson's disease    PTSD (post-traumatic stress disorder)    Weakness    Past Surgical History:  Procedure Laterality Date   NO PAST SURGERIES       A IV  Location/Drains/Wounds Patient Lines/Drains/Airways Status     Active Line/Drains/Airways     Name Placement date Placement time Site Days   Peripheral IV 11/06/22 20 G Left Forearm 11/06/22  0320  Forearm  less than 1            Intake/Output Last 24 hours  Intake/Output Summary (Last 24 hours) at 11/06/2022 0602 Last data filed at 11/06/2022 7564 Gross per 24 hour  Intake 430 ml  Output --  Net 430 ml    Labs/Imaging Results for orders placed or performed during the hospital encounter of 11/05/22 (from the past 48 hour(s))  CBC     Status: Abnormal   Collection Time: 11/05/22 10:47 PM  Result Value Ref Range   WBC 12.3 (H) 4.0 - 10.5 K/uL   RBC 5.99 (H) 4.22 - 5.81 MIL/uL   Hemoglobin 16.9 13.0 - 17.0 g/dL   HCT 33.2 95.1 - 88.4 %   MCV 84.8 80.0 - 100.0 fL   MCH 28.2 26.0 - 34.0 pg   MCHC 33.3 30.0 - 36.0 g/dL   RDW 16.6 06.3 - 01.6 %   Platelets 336 150 - 400 K/uL   nRBC 0.0 0.0 - 0.2 %    Comment: Performed at [facility], [address], [place], [place] [address]  Comprehensive metabolic panel  Status: Abnormal   Collection Time: 11/05/22 10:47 PM  Result Value Ref Range   Sodium 140 135 - 145 mmol/L   Potassium 4.5 3.5 - 5.1 mmol/L   Chloride 97 (L) 98 - 111 mmol/L   CO2 24 22 - 32 mmol/L   Glucose, Bld 133 (H) 70 - 99 mg/dL    Comment: Glucose reference range applies only to samples taken after fasting for at least 8 hours.   BUN 29 (H) 6 - 20 mg/dL   Creatinine, Ser 1.61 (H) 0.61 - 1.24 mg/dL   Calcium 09.6 8.9 - 04.5 mg/dL   Total Protein 7.8 6.5 - 8.1 g/dL   Albumin 4.3 3.5 - 5.0 g/dL   AST 20 15 - 41 U/L   ALT 25 0 - 44 U/L   Alkaline Phosphatase 85 38 - 126 U/L   Total Bilirubin 0.9 0.3 - 1.2 mg/dL   GFR, Estimated 57 (L) >60 mL/min    Comment: (NOTE) Calculated using the CKD-EPI Creatinine Equation (2021)    Anion gap 19 (H) 5 - 15    Comment: Performed at [facility], [address] [place], [place] [address]  Magnesium      Status: None   Collection Time: 11/06/22  3:25 AM  Result Value Ref Range   Magnesium 1.7 1.7 - 2.4 mg/dL    Comment: Performed at [facility], [address], [place], [place] [address]  CK     Status: None   Collection Time: 11/06/22  3:25 AM  Result Value Ref Range   Total CK 154 49 - 397 U/L    Comment: Performed at [facility], [address] [place], [place] [address]  Urinalysis, Routine w reflex microscopic -Urine, Clean Catch     Status: Abnormal   Collection Time: 11/06/22  5:22 AM  Result Value Ref Range   Color, Urine AMBER (A) YELLOW    Comment: BIOCHEMICALS MAY BE AFFECTED BY COLOR   APPearance HAZY (A) CLEAR   Specific Gravity, Urine 1.024 1.005 - 1.030   pH 5.0 5.0 - 8.0   Glucose, UA NEGATIVE NEGATIVE mg/dL   Hgb urine dipstick NEGATIVE NEGATIVE   Bilirubin Urine NEGATIVE NEGATIVE   Ketones, ur NEGATIVE NEGATIVE mg/dL   Protein, ur 30 (A) NEGATIVE mg/dL   Nitrite NEGATIVE NEGATIVE   Leukocytes,Ua NEGATIVE NEGATIVE   RBC / HPF 0-5 0 - 5 RBC/hpf   WBC, UA 0-5 0 - 5 WBC/hpf   Bacteria, UA FEW (A) NONE SEEN   Squamous Epithelial / HPF 0-5 0 - 5 /HPF   Mucus PRESENT    Hyaline Casts, UA PRESENT    Sperm, UA PRESENT     Comment: Performed at [facility], [address] [place], [place] [address]   No results found.  Pending Labs Unresulted Labs (From admission, onward)    None       Vitals/Pain Today's Vitals   11/05/22 2206 11/05/22 2228 11/05/22 2229 11/06/22 0238  BP: (!) 110/99   118/78  Pulse: 70   80  Resp: 20   19  Temp: 98.8 F (37.1 C)   98.5 F (36.9 C)  TempSrc: Oral   Oral  SpO2: 97% 97%  94%  Weight:   108.4 kg   Height:   5\' 10"  (1.778 m)     Isolation Precautions No active isolations  Medications Medications  sodium chloride 0.9 % bolus 1,000 mL (0 mLs Intravenous Stopped 11/06/22 0523)  diazepam (VALIUM) injection 5 mg (5 mg Intravenous Given 11/06/22 0327)  sodium chloride 0.9 % bolus 1,000 mL (1,000  mLs Intravenous New Bag/Given 11/06/22 0528)    Mobility walks     Focused Assessments Neuro Assessment Handoff:  Swallow screen pass? Yes          Neuro Assessment: Within Defined Limits Neuro Checks:      Has TPA been given? No If patient is a Neuro Trauma and patient is going to OR before floor call report to 4N Charge nurse: [phone] or [phone]   R Recommendations: See Admitting Provider Note  Report given to:   Additional Notes:

## 2022-11-06 NOTE — Plan of Care (Signed)

## 2022-11-06 NOTE — H&P (Signed)
History and Physical    [name] [name]:[id number] DOB: Oct 19, 1965 DOA: 11/05/2022  PCP: Park Meo, [name] (Confirm with patient/family/NH records and if not entered, this has to be entered at [facility] point of entry) Patient coming from: Home  I have personally briefly reviewed patient's old medical records in [facility]  Chief Complaint: Tremors, muscle jerking, unable to walk  HPI: [name] [name] is a 57 y.o. male with medical history significant of bipolar disorder, PTSD anxiety/depression, IIDM, HTN, HLD, presented with worsening of muscle jerking and unable to ambulate.  Patient started to notice bilateral arm and hand tremors in March this year.  For which she has been following with PCP and he also has been followed by psychiatry for multiple mental health issues including bipolar disorder PTSD and anxiety/depression.  Case psychiatry medication was adjusted in March with hydroxyzine dosage increased as well as Abilify dosage to address worsening of depression and night weight loss of 40 pounds since January.  At some point between March and April he was told that the tremor of his hand may be early signs of Parkinson's disease and he is yet to see neurology.  About 6 to 7 days ago he started to have uncontrolled jerking movement of bilateral lower extremities and abdominal wall, unable to control himself the jerking movement.  With cramping like muscle aching and makes ambulation very difficult and unsteady so he remained bed/chair bound for last 7 days.  He denies any double vision headache, weakness numbness of any of the limbs.  Denies any changes of any of his medication including psychiatric medications recently.  He denies any cough, no dysuria or diarrhea.  ED Course: Afebrile, none hypotensive.  Blood work showed CK1 54, creatinine 1.4 K4.5, calcium 10.0.  ED physician discussed with overnight on-call neurology, who recommend correct underlying medical conditions such as  dehydration and infection.  And patient was given Valium and IV bolus and initially the jerking and tremors improved but soon came back.  Review of Systems: As per HPI otherwise 14 point review of systems negative.    Past Medical History:  Diagnosis Date   Anxiety    Chest heaviness    Chills    Depression, major    Diabetes mellitus without complication (HCC)    Dizzy    Dyslipidemia    ED (erectile dysfunction)    Elevated CPK    HA (headache)    Hypertension    Hypertriglyceridemia    Insomnia    Kidney stone    Left-sided Bell's palsy    Morbid obesity (HCC)    Nausea    No appetite    Panniculitis    of right lower leg   Parkinson's disease    PTSD (post-traumatic stress disorder)    Weakness     Past Surgical History:  Procedure Laterality Date   NO PAST SURGERIES       reports that he has never smoked. He has never used smokeless tobacco. He reports that he does not drink alcohol and does not use drugs.  No Known Allergies  Family History  Problem Relation Age of Onset   Thyroid disease Brother    Depression Brother    Heart disease Mother    Kidney failure Father    Diabetes Father    Thyroid disease Sister    Mental illness Sister    Heart attack Maternal Grandfather    Heart failure Paternal Grandmother    Cancer Paternal Grandfather  bone     Prior to Admission medications   Medication Sig Start Date End Date Taking? Authorizing Provider  ALPRAZolam Prudy Feeler) 0.5 MG tablet Take 1 tablet (0.5 mg total) by mouth 3 (three) times daily as needed for anxiety. 09/28/22  Yes Mayers, Cari S, PA-C  ARIPiprazole (ABILIFY) 30 MG tablet Take 1 tablet (30 mg total) by mouth daily. 07/28/22  Yes Toy Cookey E, NP  aspirin EC 81 MG tablet Take 81 mg by mouth daily.   Yes [provider]  atorvastatin (LIPITOR) 20 MG tablet Take 1 tablet (20 mg total) by mouth daily. 08/31/22  Yes Mayers, Cari S, PA-C  buPROPion (WELLBUTRIN XL) 150 MG 24 hr  tablet TAKE 1 TABLET BY MOUTH EVERY MORNING WITH 300MG  MG (450MG  TOTAL) 07/28/22  Yes Toy Cookey E, NP  buPROPion (WELLBUTRIN XL) 300 MG 24 hr tablet Take 1 tablet (300 mg total) by mouth daily. 07/28/22  Yes Toy Cookey E, NP  DULoxetine (CYMBALTA) 60 MG capsule Take 1 capsule (60 mg total) by mouth 2 (two) times daily. 07/28/22  Yes Toy Cookey E, NP  glimepiride (AMARYL) 4 MG tablet Take 1 tablet (4 mg total) by mouth 2 (two) times daily. 08/31/22  Yes Mayers, Cari S, PA-C  hydrOXYzine (ATARAX) 50 MG tablet TAKE 1 TABLET BY MOUTH FOUR TIMES A DAY & TAKE 2 TABLETS EVERY NIGHT AT BEDTIME FOR ANXIETY, PANIC, AGITATION, AND SLEEP 07/28/22  Yes Toy Cookey E, NP  lisinopril (ZESTRIL) 20 MG tablet Take 1 tablet (20 mg total) by mouth daily. 08/31/22  Yes Mayers, Cari S, PA-C  meloxicam (MOBIC) 7.5 MG tablet Take 1 tablet (7.5 mg total) by mouth daily. 10/13/22  Yes Park Meo, [name]  metFORMIN (GLUCOPHAGE) 500 MG tablet Take 1 tablet (500 mg total) by mouth 2 (two) times daily. 08/31/22  Yes Mayers, Cari S, PA-C  Oxcarbazepine (TRILEPTAL) 300 MG tablet Take 1 tablet (300 mg total) by mouth 2 (two) times daily. 07/28/22  Yes Toy Cookey E, NP  propranolol (INDERAL) 10 MG tablet Take 1 tablet (10 mg total) by mouth 3 (three) times daily. 07/28/22  Yes Toy Cookey E, NP  cholecalciferol (VITAMIN D) 1000 units tablet Take 1,000 Units by mouth daily. Patient not taking: Reported on 10/13/2022    [provider]  CINNAMON PO Take 1 capsule by mouth daily. Patient not taking: Reported on 10/13/2022    [provider]  Cyanocobalamin (VITAMIN B-12 PO) Take 1 tablet by mouth daily. Patient not taking: Reported on 10/13/2022    [provider]  DULoxetine (CYMBALTA) 20 MG capsule TAKE 1 CAPSULE BY MOUTH DAILY 07/28/22   Toy Cookey E, NP  Multiple Vitamin (MULTIVITAMIN) tablet Take 1 tablet by mouth daily. Patient not taking: Reported on 10/13/2022     [provider]  Omega-3 1000 MG CAPS Take 1,000 mg by mouth daily. Patient not taking: Reported on 10/13/2022    [provider]  omeprazole (PRILOSEC) 20 MG capsule Take 20 mg by mouth daily. Patient not taking: Reported on 10/13/2022    [provider]  pantoprazole (PROTONIX) 20 MG tablet Take 1 tablet (20 mg total) by mouth daily. Patient not taking: Reported on 10/13/2022 12/27/19   Petrucelli, Pleas Koch, PA-C  promethazine (PHENERGAN) 25 MG tablet Take 25 mg by mouth every 6 (six) hours as needed for nausea or vomiting. Patient not taking: Reported on 10/13/2022    [provider]  sildenafil (REVATIO) 20 MG tablet Take 20 mg by  mouth 3 (three) times daily. Patient not taking: Reported on 10/13/2022    [provider]  sucralfate (CARAFATE) 1 GM/10ML suspension Take 10 mLs (1 g total) by mouth 4 (four) times daily -  with meals and at bedtime. Patient not taking: Reported on 10/13/2022 12/27/19   Cherly Anderson, PA-C    Physical Exam: Vitals:   11/06/22 0238 11/06/22 0538 11/06/22 0652 11/06/22 0856  BP: 118/78  130/82 118/71  Pulse: 80  72 74  Resp: 19  18 18   Temp: 98.5 F (36.9 C) 98.6 F (37 C) 97.7 F (36.5 C) 98 F (36.7 C)  TempSrc: Oral Oral Oral Oral  SpO2: 94%   95%  Weight:      Height:        Constitutional: NAD, calm, comfortable Vitals:   11/06/22 0238 11/06/22 0538 11/06/22 0652 11/06/22 0856  BP: 118/78  130/82 118/71  Pulse: 80  72 74  Resp: 19  18 18   Temp: 98.5 F (36.9 C) 98.6 F (37 C) 97.7 F (36.5 C) 98 F (36.7 C)  TempSrc: Oral Oral Oral Oral  SpO2: 94%   95%  Weight:      Height:       Eyes: PERRL, lids and conjunctivae normal ENMT: Mucous membranes are moist. Posterior pharynx clear of any exudate or lesions.Normal dentition.  Neck: normal, supple, no masses, no thyromegaly Respiratory: clear to auscultation bilaterally, no wheezing, no crackles. Normal respiratory effort. No accessory muscle  use.  Cardiovascular: Regular rate and rhythm, no murmurs / rubs / gallops. No extremity edema. 2+ pedal pulses. No carotid bruits.  Abdomen: no tenderness, no masses palpated. No hepatosplenomegaly. Bowel sounds positive.  Musculoskeletal: no clubbing / cyanosis. No joint deformity upper and lower extremities. Good ROM, no contractures. Normal muscle tone.  Skin: no rashes, lesions, ulcers. No induration Neurologic: CN 2-12 grossly intact. Sensation intact, DTR normal. Strength 5/5 in all 4.  Involuntary jerking like movements of bilateral lower extremities and abdominal wall, normal muscle tone and normal rigidity.  Impaired finger-to-nose and heel-to-shin movement Psychiatric: Normal judgment and insight. Alert and oriented x 3. Normal mood.     Labs on Admission: I have personally reviewed following labs and imaging studies  CBC: Recent Labs  Lab 11/05/22 2247  WBC 12.3*  HGB 16.9  HCT 50.8  MCV 84.8  PLT 336   Basic Metabolic Panel: Recent Labs  Lab 11/05/22 2247 11/06/22 0325  NA 140  --   K 4.5  --   CL 97*  --   CO2 24  --   GLUCOSE 133*  --   BUN 29*  --   CREATININE 1.44*  --   CALCIUM 10.0  --   MG  --  1.7   GFR: Estimated Creatinine Clearance: 69.8 mL/min (A) (by C-G formula based on SCr of 1.44 mg/dL (H)). Liver Function Tests: Recent Labs  Lab 11/05/22 2247  AST 20  ALT 25  ALKPHOS 85  BILITOT 0.9  PROT 7.8  ALBUMIN 4.3   No results for input(s): "LIPASE", "AMYLASE" in the last 168 hours. No results for input(s): "AMMONIA" in the last 168 hours. Coagulation Profile: No results for input(s): "INR", "PROTIME" in the last 168 hours. Cardiac Enzymes: Recent Labs  Lab 11/06/22 0325  CKTOTAL 154   BNP (last 3 results) No results for input(s): "PROBNP" in the last 8760 hours. HbA1C: No results for input(s): "HGBA1C" in the last 72 hours. CBG: Recent Labs  Lab 11/06/22 [phone]  GLUCAP 138*   Lipid Profile: No results for input(s): "CHOL",  "HDL", "LDLCALC", "TRIG", "CHOLHDL", "LDLDIRECT" in the last 72 hours. Thyroid Function Tests: No results for input(s): "TSH", "T4TOTAL", "FREET4", "T3FREE", "THYROIDAB" in the last 72 hours. Anemia Panel: No results for input(s): "VITAMINB12", "FOLATE", "FERRITIN", "TIBC", "IRON", "RETICCTPCT" in the last 72 hours. Urine analysis:    Component Value Date/Time   COLORURINE AMBER (A) 11/06/2022 0522   APPEARANCEUR HAZY (A) 11/06/2022 0522   LABSPEC 1.024 11/06/2022 0522   PHURINE 5.0 11/06/2022 0522   GLUCOSEU NEGATIVE 11/06/2022 0522   HGBUR NEGATIVE 11/06/2022 0522   BILIRUBINUR NEGATIVE 11/06/2022 0522   KETONESUR NEGATIVE 11/06/2022 0522   PROTEINUR 30 (A) 11/06/2022 0522   UROBILINOGEN 1.0 10/24/2008 1155   NITRITE NEGATIVE 11/06/2022 0522   LEUKOCYTESUR NEGATIVE 11/06/2022 0522    Radiological Exams on Admission: No results found.  EKG: None  Assessment/Plan Principal Problem:   Myoclonus Active Problems:   Bipolar I disorder, most recent episode depressed (HCC)   Essential hypertension   Ataxia  (please populate well all problems here in Problem List. (For example, if patient is on BP meds at home and you resume or decide to hold them, it is a problem that needs to be her. Same for CAD, COPD, HLD and so on)  Myoclonus Acute ataxia Subacute tremors Acute ambulation dysfunction -Suspect polypharmacy/antipsychiatry drug side effect -Case was discussed with on-call neurology, who will come to see the patient -As per recommended by overnight neurology, will continue hydrate.  Infection disease wise, no significant infection source found, will hold off antibiotics. -Will cut Abilify dosage, and Wellbutrin and Cymbalta and cut down hydroxyzine dosage, consult psychiatry to help [place] to further trim patient's psychiatry medications, -For tremors, continue Inderal, outpatient neurology follow-up for possible Parkinson's disease -PT evaluation  IIDM -Controlled, continue  Amaryl and metformin  History of bipolar disorder PTSD Anxiety/depression -As above  HTN -Stable, continue lisinopril   DVT prophylaxis: Lovenox Code Status: Full code Family Communication: None at bedside Disposition Plan: Patient is sick with acute ataxia and myoclonus, requiring inpatient neurology evaluation, and inpatient PT evaluation, expect more than 2 midnight hospital stay Consults called: Neurology Admission status: Telemetry admission   Emeline General MD [facility] Pager [phone]  11/06/2022, 9:35 AM

## 2022-11-06 NOTE — Discharge Instructions (Addendum)
Follow with Primary MD Park Meo, [name] along with your psychiatrist in 7 days   Get CBC, CMP, magnesium-  checked next visit with your primary MD   Activity: As tolerated with Full fall precautions use walker/cane & assistance as needed  Disposition Home    Diet: Heart Healthy low carbohydrate diet, check CBGs q. [facility]  Special Instructions: If you have smoked or chewed Tobacco  in the last 2 yrs please stop smoking, stop any regular Alcohol  and or any Recreational drug use.  On your next visit with your primary care physician please Get Medicines reviewed and adjusted.  Please request your Prim.MD to go over all Hospital Tests and Procedure/Radiological results at the follow up, please get all Hospital records sent to your Prim MD by signing hospital release before you go home.  If you experience worsening of your admission symptoms, develop shortness of breath, life threatening emergency, suicidal or homicidal thoughts you must seek medical attention immediately by calling 911 or calling your MD immediately  if symptoms less severe.  You Must read complete instructions/literature along with all the possible adverse reactions/side effects for all the Medicines you take and that have been prescribed to you. Take any new Medicines after you have completely understood and accpet all the possible adverse reactions/side effects.

## 2022-11-06 NOTE — ED Notes (Signed)
Pt reports recent dx of Parkinson's. For the past week his tremors have worsened and now include jerking movements of his legs.

## 2022-11-06 NOTE — Consult Note (Addendum)
[facility] Face-to-Face Psychiatry Consult   Reason for Consult: ''acute ataxia, suspect polypharmacy.'' Referring Physician:  Mikey College, MD Patient Identification: [name] [name] MRN:  [id number] Principal Diagnosis: Myoclonus Diagnosis:  Principal Problem:   Myoclonus Active Problems:   MDD (major depressive disorder)   PTSD (post-traumatic stress disorder)   Bipolar I disorder, most recent episode depressed (HCC)   Essential hypertension   Ataxia   Total Time spent with patient: 1 hour  Subjective:   [name] [name] is a 57 y.o. male patient admitted with tremors and body twitching.  HPI:  57 y.o. male with medical history significant for Bipolar depression, PTSD, anxiety, IIDM, HTN, HLD, presented with worsening of muscle jerking, tremors and inability to ambulate. Patient is alert, awake and cooperative. He states that he was well until March 2024 when he noticed bilateral arm and hand tremors  which is getting worse and has progressively spread to his upper, lower extremities and his trunk. He states that he informed his psychiatric provider and despite medication adjustment, his symptoms have been getting worse. He was receiving, Abilify, Trileptal, Bupropion XL and Hydroxyzine. Patient denies psychosis, delusions, depression, mood swings, self harming thoughts, drugs and alcohol abuse. He states that his symptoms has been getting worse preventing him from ambulation.  Past Psychiatric History: as above  Risk to Self:  denies  Risk to Others:  denies Prior Inpatient Therapy:  none reports Prior Outpatient Therapy:  yes  Past Medical History:  Past Medical History:  Diagnosis Date   Anxiety    Chest heaviness    Chills    Depression, major    Diabetes mellitus without complication (HCC)    Dizzy    Dyslipidemia    ED (erectile dysfunction)    Elevated CPK    HA (headache)    Hypertension    Hypertriglyceridemia    Insomnia    Kidney stone    Left-sided Bell's palsy     Morbid obesity (HCC)    Nausea    No appetite    Panniculitis    of right lower leg   Parkinson's disease    PTSD (post-traumatic stress disorder)    Weakness     Past Surgical History:  Procedure Laterality Date   NO PAST SURGERIES     Family History:  Family History  Problem Relation Age of Onset   Thyroid disease Brother    Depression Brother    Heart disease Mother    Kidney failure Father    Diabetes Father    Thyroid disease Sister    Mental illness Sister    Heart attack Maternal Grandfather    Heart failure Paternal Grandmother    Cancer Paternal Grandfather        bone   Family Psychiatric  History:  Social History:  Social History   Substance and Sexual Activity  Alcohol Use No     Social History   Substance and Sexual Activity  Drug Use No    Social History   Socioeconomic History   Marital status: Single    Spouse name: Not on file   Number of children: 0   Years of education: 12   Highest education level: Not on file  Occupational History   Not on file  Tobacco Use   Smoking status: Never   Smokeless tobacco: Never  Vaping Use   Vaping Use: Never used  Substance and Sexual Activity   Alcohol use: No   Drug use: No   Sexual activity:  Not on file  Other Topics Concern   Not on file  Social History Narrative   Lives with brother in a one story home.  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    Social Determinants of Health   Financial Resource Strain: Not on file  Food Insecurity: No Food Insecurity (11/06/2022)   Hunger Vital Sign    Worried About Running Out of Food in the Last Year: Never true    Ran Out of Food in the Last Year: Never true  Transportation Needs: No Transportation Needs (11/06/2022)   PRAPARE - Administrator, Civil Service (Medical): No    Lack of Transportation (Non-Medical): No  Physical Activity: Not on file  Stress: Not on file  Social Connections: Not on file   Additional  Social History:    Allergies:  No Known Allergies  Labs:  Results for orders placed or performed during the hospital encounter of 11/05/22 (from the past 48 hour(s))  CBC     Status: Abnormal   Collection Time: 11/05/22 10:47 PM  Result Value Ref Range   WBC 12.3 (H) 4.0 - 10.5 K/uL   RBC 5.99 (H) 4.22 - 5.81 MIL/uL   Hemoglobin 16.9 13.0 - 17.0 g/dL   HCT 09.8 11.9 - 14.7 %   MCV 84.8 80.0 - 100.0 fL   MCH 28.2 26.0 - 34.0 pg   MCHC 33.3 30.0 - 36.0 g/dL   RDW 82.9 56.2 - 13.0 %   Platelets 336 150 - 400 K/uL   nRBC 0.0 0.0 - 0.2 %    Comment: Performed at [facility], [address], [place], [place] [address]  Comprehensive metabolic panel     Status: Abnormal   Collection Time: 11/05/22 10:47 PM  Result Value Ref Range   Sodium 140 135 - 145 mmol/L   Potassium 4.5 3.5 - 5.1 mmol/L   Chloride 97 (L) 98 - 111 mmol/L   CO2 24 22 - 32 mmol/L   Glucose, Bld 133 (H) 70 - 99 mg/dL    Comment: Glucose reference range applies only to samples taken after fasting for at least 8 hours.   BUN 29 (H) 6 - 20 mg/dL   Creatinine, Ser 4.69 (H) 0.61 - 1.24 mg/dL   Calcium 62.9 8.9 - 52.8 mg/dL   Total Protein 7.8 6.5 - 8.1 g/dL   Albumin 4.3 3.5 - 5.0 g/dL   AST 20 15 - 41 U/L   ALT 25 0 - 44 U/L   Alkaline Phosphatase 85 38 - 126 U/L   Total Bilirubin 0.9 0.3 - 1.2 mg/dL   GFR, Estimated 57 (L) >60 mL/min    Comment: (NOTE) Calculated using the CKD-EPI Creatinine Equation (2021)    Anion gap 19 (H) 5 - 15    Comment: Performed at [facility], [address], [place], [place] [address]  Magnesium     Status: None   Collection Time: 11/06/22  3:25 AM  Result Value Ref Range   Magnesium 1.7 1.7 - 2.4 mg/dL    Comment: Performed at [facility], [address] [place], [place] [address]  CK     Status: None   Collection Time: 11/06/22  3:25 AM  Result Value Ref Range   Total CK 154 49 - 397 U/L    Comment: Performed at [facility], [address],  [place], [place] [address]  Urinalysis, Routine w reflex microscopic -Urine,  Clean Catch     Status: Abnormal   Collection Time: 11/06/22  5:22 AM  Result Value Ref Range   Color, Urine AMBER (A) YELLOW    Comment: BIOCHEMICALS MAY BE AFFECTED BY COLOR   APPearance HAZY (A) CLEAR   Specific Gravity, Urine 1.024 1.005 - 1.030   pH 5.0 5.0 - 8.0   Glucose, UA NEGATIVE NEGATIVE mg/dL   Hgb urine dipstick NEGATIVE NEGATIVE   Bilirubin Urine NEGATIVE NEGATIVE   Ketones, ur NEGATIVE NEGATIVE mg/dL   Protein, ur 30 (A) NEGATIVE mg/dL   Nitrite NEGATIVE NEGATIVE   Leukocytes,Ua NEGATIVE NEGATIVE   RBC / HPF 0-5 0 - 5 RBC/hpf   WBC, UA 0-5 0 - 5 WBC/hpf   Bacteria, UA FEW (A) NONE SEEN   Squamous Epithelial / HPF 0-5 0 - 5 /HPF   Mucus PRESENT    Hyaline Casts, UA PRESENT    Sperm, UA PRESENT     Comment: Performed at [facility], [address] [place], [place] [address]  Glucose, capillary     Status: Abnormal   Collection Time: 11/06/22  8:56 AM  Result Value Ref Range   Glucose-Capillary 138 (H) 70 - 99 mg/dL    Comment: Glucose reference range applies only to samples taken after fasting for at least 8 hours.  Ammonia     Status: None   Collection Time: 11/06/22  9:09 AM  Result Value Ref Range   Ammonia 17 9 - 35 umol/L    Comment: Performed at [facility], [address], [place], [place] [address]  Glucose, capillary     Status: Abnormal   Collection Time: 11/06/22 11:35 AM  Result Value Ref Range   Glucose-Capillary 126 (H) 70 - 99 mg/dL    Comment: Glucose reference range applies only to samples taken after fasting for at least 8 hours.    Current Facility-Administered Medications  Medication Dose Route Frequency Provider Last Rate Last Admin   0.9 %  sodium chloride infusion   Intravenous Continuous Mikey College T, MD 150 mL/hr at 11/06/22 1052 New Bag at 11/06/22 1052   ALPRAZolam (XANAX) tablet 0.5 mg  0.5 mg Oral TID PRN Mikey College T, MD       ARIPiprazole  (ABILIFY) tablet 20 mg  20 mg Oral Daily Mikey College T, MD   20 mg at 11/06/22 1240   aspirin EC tablet 81 mg  81 mg Oral Daily Mikey College T, MD   81 mg at 11/06/22 1042   atorvastatin (LIPITOR) tablet 20 mg  20 mg Oral Daily Mikey College T, MD   20 mg at 11/06/22 1042   buPROPion (WELLBUTRIN XL) 24 hr tablet 150 mg  150 mg Oral Daily Mikey College T, MD   150 mg at 11/06/22 1042   buPROPion (WELLBUTRIN XL) 24 hr tablet 300 mg  300 mg Oral QHS Pham, Minh Q, RPH-CPP       DULoxetine (CYMBALTA) DR capsule 20 mg  20 mg Oral Daily Mikey College T, MD   20 mg at 11/06/22 1239   enoxaparin (LOVENOX) injection 40 mg  40 mg Subcutaneous Q24H Mikey College T, MD   40 mg at 11/06/22 1042   glimepiride (AMARYL) tablet 4 mg  4 mg Oral BID WC Mikey College T, MD   4 mg at 11/06/22 1243   hydrOXYzine (ATARAX) tablet 100 mg  100 mg Oral QHS Pham, Minh Q, RPH-CPP       hydrOXYzine (ATARAX)  tablet 50 mg  50 mg Oral 4 times per day Mikey College T, MD   50 mg at 11/06/22 1239   insulin aspart (novoLOG) injection 0-15 Units  0-15 Units Subcutaneous TID WC Mikey College T, MD       lisinopril (ZESTRIL) tablet 20 mg  20 mg Oral Daily Mikey College T, MD   20 mg at 11/06/22 1042   metFORMIN (GLUCOPHAGE) tablet 1,000 mg  1,000 mg Oral BID WC Pham, Minh Q, RPH-CPP   1,000 mg at 11/06/22 1239   Oxcarbazepine (TRILEPTAL) tablet 300 mg  300 mg Oral BID Mikey College T, MD   300 mg at 11/06/22 1240   propranolol (INDERAL) tablet 10 mg  10 mg Oral TID Emeline General, MD   10 mg at 11/06/22 1042    Musculoskeletal: Strength & Muscle Tone: abnormal Gait & Station: unsteady Patient leans: N/A    Psychiatric Specialty Exam:  Presentation  General Appearance:  Appropriate for Environment  Eye Contact: Good  Speech: Clear and Coherent  Speech Volume: Normal  Handedness: Right   Mood and Affect  Mood: Euphoric  Affect: Appropriate   Thought Process  Thought Processes: Coherent  Descriptions of  Associations:Intact  Orientation:None  Thought Content:Logical  History of Schizophrenia/Schizoaffective disorder:No data recorded Duration of Psychotic Symptoms:No data recorded Hallucinations:Hallucinations: None  Ideas of Reference:None  Suicidal Thoughts:Suicidal Thoughts: No  Homicidal Thoughts:Homicidal Thoughts: No   Sensorium  Memory: Immediate Good; Recent Good; Remote Good  Judgment: Intact  Insight: Good   Executive Functions  Concentration: Good  Attention Span: Good  Recall: Good  Fund of Knowledge: Good  Language: Good   Psychomotor Activity  Psychomotor Activity: Psychomotor Activity: Extrapyramidal Side Effects (EPS) Extrapyramidal Side Effects (EPS): Tardive Dyskinesia   Assets  Assets: Communication Skills; Desire for Improvement   Sleep  Sleep: Sleep: Fair   Physical Exam: Physical Exam Review of Systems  Psychiatric/Behavioral:  Negative for depression, hallucinations, memory loss, substance abuse and suicidal ideas. The patient is not nervous/anxious and does not have insomnia.    Blood pressure 131/81, pulse 74, temperature (!) 97.5 F (36.4 C), temperature source Oral, resp. rate 18, height 5\' 10"  (1.778 m), weight 108.4 kg, SpO2 93 %. Body mass index is 34.29 kg/m.  Treatment Plan Summary: 57 year old male with history of  Bipolar depression, PTSD, anxiety, IIDM, HTN, HLD, presented with worsening of muscle jerking, tremors and inability to ambulate. He states that his symptoms started in March, 2024 and has been getting worse since then. Even though, the etiologist of his symptoms is not clear for now but it certainly looks like medication induced extrapyramidal side effects, twitching such as Tardive dyskinesia.  Plan/Recommendations: -Will discontinue Abilify-it may cause tremors and muscle twitching -Will discontinue Trileptal-there are reported cases of this medication causing tremors, Ataxia and muscle pain in  higher doses -Will discontinue Wellbutrin XL-potentially can cause tremors, muscle twitches and jerks -Will reduce Hydroxyzine to 25 mg tid as there were reported cases of high doses causing shakiness, tremors and muscle twitches -Consider Valproic acid 250 mg BID for Bipolar- not link to high risk muscle twitches -Consider Ingrezza 60 mg daily at bedtime for TD -Consider Clonazepam 1mg  tid -muscle relaxant/tremors/TD -Monitor CK level - Consider Neurology consult Disposition: No evidence of imminent risk to self or others at present.   Patient does not meet criteria for psychiatric inpatient admission. Supportive therapy provided about ongoing stressors. Psychiatric service will follow this patient  Thedore Mins, MD 11/06/2022 1:16  PM

## 2022-11-06 NOTE — ED Provider Notes (Signed)
[place] EMERGENCY DEPARTMENT AT [facility] Provider Note   CSN: [id number] Arrival date & time: 11/05/22  2204     History  Chief Complaint  Patient presents with   Tremors    [name] [name] is a 57 y.o. male.  Patient presents to the emergency department for evaluation of muscle spasms and jerking of his extremities and torso.  Symptoms present for about a week but have gotten worse tonight.  Patient reports a history of Parkinson's disease but these jerking motions are new.       Home Medications Prior to Admission medications   Medication Sig Start Date End Date Taking? Authorizing Provider  ALPRAZolam Prudy Feeler) 0.5 MG tablet Take 1 tablet (0.5 mg total) by mouth 3 (three) times daily as needed for anxiety. 09/28/22   Mayers, Cari S, PA-C  ARIPiprazole (ABILIFY) 30 MG tablet Take 1 tablet (30 mg total) by mouth daily. 07/28/22   Shanna Cisco, NP  aspirin EC 81 MG tablet Take 81 mg by mouth daily.    [provider]  atorvastatin (LIPITOR) 20 MG tablet Take 1 tablet (20 mg total) by mouth daily. 08/31/22   Mayers, Cari S, PA-C  buPROPion (WELLBUTRIN XL) 150 MG 24 hr tablet TAKE 1 TABLET BY MOUTH EVERY MORNING WITH 300MG  MG (450MG  TOTAL) 07/28/22   Toy Cookey E, NP  buPROPion (WELLBUTRIN XL) 300 MG 24 hr tablet Take 1 tablet (300 mg total) by mouth daily. 07/28/22   Shanna Cisco, NP  cholecalciferol (VITAMIN D) 1000 units tablet Take 1,000 Units by mouth daily. Patient not taking: Reported on 10/13/2022    [provider]  CINNAMON PO Take 1 capsule by mouth daily. Patient not taking: Reported on 10/13/2022    [provider]  Cyanocobalamin (VITAMIN B-12 PO) Take 1 tablet by mouth daily. Patient not taking: Reported on 10/13/2022    [provider]  DULoxetine (CYMBALTA) 20 MG capsule TAKE 1 CAPSULE BY MOUTH DAILY 07/28/22   Toy Cookey E, NP  DULoxetine (CYMBALTA) 60 MG capsule Take 1 capsule (60 mg total) by  mouth 2 (two) times daily. 07/28/22   Shanna Cisco, NP  glimepiride (AMARYL) 4 MG tablet Take 1 tablet (4 mg total) by mouth 2 (two) times daily. 08/31/22   Mayers, Cari S, PA-C  hydrOXYzine (ATARAX) 50 MG tablet TAKE 1 TABLET BY MOUTH FOUR TIMES A DAY & TAKE 2 TABLETS EVERY NIGHT AT BEDTIME FOR ANXIETY, PANIC, AGITATION, AND SLEEP 07/28/22   Toy Cookey E, NP  lisinopril (ZESTRIL) 20 MG tablet Take 1 tablet (20 mg total) by mouth daily. 08/31/22   Mayers, Cari S, PA-C  meloxicam (MOBIC) 7.5 MG tablet Take 1 tablet (7.5 mg total) by mouth daily. 10/13/22   Park Meo, [name]  metFORMIN (GLUCOPHAGE) 500 MG tablet Take 1 tablet (500 mg total) by mouth 2 (two) times daily. 08/31/22   Mayers, Cari S, PA-C  Multiple Vitamin (MULTIVITAMIN) tablet Take 1 tablet by mouth daily. Patient not taking: Reported on 10/13/2022    [provider]  Omega-3 1000 MG CAPS Take 1,000 mg by mouth daily. Patient not taking: Reported on 10/13/2022    [provider]  omeprazole (PRILOSEC) 20 MG capsule Take 20 mg by mouth daily. Patient not taking: Reported on 10/13/2022    [provider]  Oxcarbazepine (TRILEPTAL) 300 MG tablet Take 1 tablet (300 mg total) by mouth 2 (two) times daily. 07/28/22   Shanna Cisco, NP  pantoprazole (  PROTONIX) 20 MG tablet Take 1 tablet (20 mg total) by mouth daily. Patient not taking: Reported on 10/13/2022 12/27/19   Petrucelli, Pleas Koch, PA-C  promethazine (PHENERGAN) 25 MG tablet Take 25 mg by mouth every 6 (six) hours as needed for nausea or vomiting. Patient not taking: Reported on 10/13/2022    [provider]  propranolol (INDERAL) 10 MG tablet Take 1 tablet (10 mg total) by mouth 3 (three) times daily. 07/28/22   Toy Cookey E, NP  sildenafil (REVATIO) 20 MG tablet Take 20 mg by mouth 3 (three) times daily. Patient not taking: Reported on 10/13/2022    [provider]  sucralfate (CARAFATE) 1 GM/10ML suspension Take 10 mLs (1 g  total) by mouth 4 (four) times daily -  with meals and at bedtime. Patient not taking: Reported on 10/13/2022 12/27/19   Petrucelli, Pleas Koch, PA-C      Allergies    Patient has no known allergies.    Review of Systems   Review of Systems  Physical Exam Updated Vital Signs BP 118/78 (BP Location: Left Arm)   Pulse 80   Temp 98.5 F (36.9 C) (Oral)   Resp 19   Ht 5\' 10"  (1.778 m)   Wt 108.4 kg   SpO2 94%   BMI 34.29 kg/m  Physical Exam Vitals and nursing note reviewed.  Constitutional:      General: He is not in acute distress.    Appearance: He is well-developed.  HENT:     Head: Normocephalic and atraumatic.     Mouth/Throat:     Mouth: Mucous membranes are moist.  Eyes:     General: Vision grossly intact. Gaze aligned appropriately.     Extraocular Movements: Extraocular movements intact.     Conjunctiva/sclera: Conjunctivae normal.  Cardiovascular:     Rate and Rhythm: Normal rate and regular rhythm.     Pulses: Normal pulses.     Heart sounds: Normal heart sounds, S1 normal and S2 normal. No murmur heard.    No friction rub. No gallop.  Pulmonary:     Effort: Pulmonary effort is normal. No respiratory distress.     Breath sounds: Normal breath sounds.  Abdominal:     Palpations: Abdomen is soft.     Tenderness: There is no abdominal tenderness. There is no guarding or rebound.     Hernia: No hernia is present.  Musculoskeletal:        General: No swelling.     Cervical back: Full passive range of motion without pain, normal range of motion and neck supple. No pain with movement, spinous process tenderness or muscular tenderness. Normal range of motion.     Right lower leg: No edema.     Left lower leg: No edema.  Skin:    General: Skin is warm and dry.     Capillary Refill: Capillary refill takes less than 2 seconds.     Findings: No ecchymosis, erythema, lesion or wound.  Neurological:     Mental Status: He is alert and oriented to person, place, and time.      GCS: GCS eye subscore is 4. GCS verbal subscore is 5. GCS motor subscore is 6.     Cranial Nerves: Cranial nerves 2-12 are intact.     Sensory: Sensation is intact.     Motor: Tremor present. No weakness or abnormal muscle tone.     Coordination: Coordination is intact.     Comments: Intermittent jerking of all extremities, visible muscle  contractions in his legs  Psychiatric:        Mood and Affect: Mood normal.        Speech: Speech normal.        Behavior: Behavior normal.     ED Results / Procedures / Treatments   Labs (all labs ordered are listed, but only abnormal results are displayed) Labs Reviewed  CBC - Abnormal; Notable for the following components:      Result Value   WBC 12.3 (*)    RBC 5.99 (*)    All other components within normal limits  COMPREHENSIVE METABOLIC PANEL - Abnormal; Notable for the following components:   Chloride 97 (*)    Glucose, Bld 133 (*)    BUN 29 (*)    Creatinine, Ser 1.44 (*)    GFR, Estimated 57 (*)    Anion gap 19 (*)    All other components within normal limits  MAGNESIUM  CK  URINALYSIS, ROUTINE W REFLEX MICROSCOPIC    EKG None  Radiology No results found.  Procedures Procedures    Medications Ordered in ED Medications  sodium chloride 0.9 % bolus 1,000 mL (0 mLs Intravenous Stopped 11/06/22 0523)  diazepam (VALIUM) injection 5 mg (5 mg Intravenous Given 11/06/22 0327)  sodium chloride 0.9 % bolus 1,000 mL (1,000 mLs Intravenous New Bag/Given 11/06/22 0528)    ED Course/ Medical Decision Making/ A&P                             Medical Decision Making Amount and/or Complexity of Data Reviewed External Data Reviewed: labs and notes. Labs: ordered. Decision-making details documented in ED Course.  Risk Prescription drug management.   Differential diagnosis considered includes, but not limited to: Myoclonus; seizure; muscle spasm; electrolyte abnormality  Presents with intermittent jerking of his legs.  He  has some episodes in his torso as well.  Lab work reveals that he does have elevation of BUN and creatinine from his baseline, dehydration considered.  Patient given a liter of saline and Valium with improvement.  Patient reports that he was diagnosed with Parkinson's but he is not seeing a neurologist.  He is not on medications.  He does have upper extremity resting tremor with some pill-rolling which does seem consistent with Parkinson's.  Patient did improve with Valium and saline, however he is now back to significant frequent spasms and myoclonus of lower half of his body.  Discussed with Dr. Otelia Limes, on-call for neurology.  He has reviewed the labs and recommends treatment for dehydration as well as looking for possible infection (because of white count of 12) as a cause of his myoclonus.  Recommends hospitalist admission, if no improvement with hydration, reconsult neurology.         Final Clinical Impression(s) / ED Diagnoses Final diagnoses:  Myoclonus    Rx / DC Orders ED Discharge Orders     None         Murry Diaz, Canary Brim, MD 11/06/22 [id number]

## 2022-11-06 NOTE — Consult Note (Signed)
Neurology Consultation  Reason for Consult: tremors, myoclonus Referring Physician: Dr. Chipper Herb  CC: tremor, muscle jerking and inability to ambulate  History is obtained from:patient and medical record  HPI: [name] [name] is a 57 y.o. male with past medical history of anxiety and depression, HTN, HLD, PTSD, parkinsons disease, bipolar, DM, headaches, insomnia, left sided Bell's Palsy who presents for evaluation of worsening muscle jerking and inability to ambulate. Per patient, he started to develop tremors about 1 year ago which then progressed into bigger muscle jerks. He has visited his PCP and medication was adjusted. Over the last week or so the muscle jerking became so bad as it was happening very frequently and waking him up at night. It has improved some but has continued to be very frequent affecting his ambulation so he came to be evaluated. He denies any weakness, numbness, tingling, facial droop or vision changes. Neurology consulted   ROS: Full ROS was performed and is negative except as noted in the HPI.    Past Medical History:  Diagnosis Date   Anxiety    Chest heaviness    Chills    Depression, major    Diabetes mellitus without complication (HCC)    Dizzy    Dyslipidemia    ED (erectile dysfunction)    Elevated CPK    HA (headache)    Hypertension    Hypertriglyceridemia    Insomnia    Kidney stone    Left-sided Bell's palsy    Morbid obesity (HCC)    Nausea    No appetite    Panniculitis    of right lower leg   Parkinson's disease    PTSD (post-traumatic stress disorder)    Weakness      Family History  Problem Relation Age of Onset   Thyroid disease Brother    Depression Brother    Heart disease Mother    Kidney failure Father    Diabetes Father    Thyroid disease Sister    Mental illness Sister    Heart attack Maternal Grandfather    Heart failure Paternal Grandmother    Cancer Paternal Grandfather        bone     Social History:    reports that he has never smoked. He has never used smokeless tobacco. He reports that he does not drink alcohol and does not use drugs.  Medications  Current Facility-Administered Medications:    0.9 %  sodium chloride infusion, , Intravenous, Continuous, Mikey College T, MD, Last Rate: 150 mL/hr at 11/06/22 1052, New Bag at 11/06/22 1052   ALPRAZolam (XANAX) tablet 0.5 mg, 0.5 mg, Oral, TID PRN, Mikey College T, MD   ARIPiprazole (ABILIFY) tablet 20 mg, 20 mg, Oral, Daily, Mikey College T, MD   aspirin EC tablet 81 mg, 81 mg, Oral, Daily, Chipper Herb, Ping T, MD, 81 mg at 11/06/22 1042   atorvastatin (LIPITOR) tablet 20 mg, 20 mg, Oral, Daily, Chipper Herb, Ping T, MD, 20 mg at 11/06/22 1042   buPROPion (WELLBUTRIN XL) 24 hr tablet 150 mg, 150 mg, Oral, Daily, Chipper Herb, Ping T, MD, 150 mg at 11/06/22 1042   buPROPion (WELLBUTRIN XL) 24 hr tablet 300 mg, 300 mg, Oral, QHS, Pham, Minh Q, RPH-CPP   DULoxetine (CYMBALTA) DR capsule 20 mg, 20 mg, Oral, Daily, Zhang, Ping T, MD   enoxaparin (LOVENOX) injection 40 mg, 40 mg, Subcutaneous, Q24H, Mikey College T, MD, 40 mg at 11/06/22 1042   glimepiride (AMARYL) tablet 4 mg, 4 mg, Oral,  BID WC, Mikey College T, MD   hydrOXYzine (ATARAX) tablet 100 mg, 100 mg, Oral, QHS, Pham, Minh Q, RPH-CPP   hydrOXYzine (ATARAX) tablet 50 mg, 50 mg, Oral, 4 times per day, Mikey College T, MD   insulin aspart (novoLOG) injection 0-15 Units, 0-15 Units, Subcutaneous, TID WC, Emeline General, MD   lisinopril (ZESTRIL) tablet 20 mg, 20 mg, Oral, Daily, Mikey College T, MD, 20 mg at 11/06/22 1042   metFORMIN (GLUCOPHAGE) tablet 1,000 mg, 1,000 mg, Oral, BID WC, Pham, Minh Q, RPH-CPP   Oxcarbazepine (TRILEPTAL) tablet 300 mg, 300 mg, Oral, BID, Mikey College T, MD   propranolol (INDERAL) tablet 10 mg, 10 mg, Oral, TID, Mikey College T, MD, 10 mg at 11/06/22 1042   Exam: Current vital signs: BP 118/71 (BP Location: Left Arm)   Pulse 74   Temp 98 F (36.7 C) (Oral)   Resp 18   Ht 5\' 10"  (1.778 m)    Wt 108.4 kg   SpO2 95%   BMI 34.29 kg/m  Vital signs in last 24 hours: Temp:  [97.7 F (36.5 C)-98.8 F (37.1 C)] 98 F (36.7 C) (06/29 0856) Pulse Rate:  [70-80] 74 (06/29 0856) Resp:  [18-20] 18 (06/29 0856) BP: (110-130)/(71-99) 118/71 (06/29 0856) SpO2:  [94 %-97 %] 95 % (06/29 0856) Weight:  [108.4 kg] 108.4 kg (06/28 2229)  GENERAL: Awake, alert in NAD HEENT: - Normocephalic and atraumatic, dry mm, face and neck red  LUNGS - Clear to auscultation bilaterally with no wheezes CV - S1S2 RRR, no m/r/g, equal pulses bilaterally. ABDOMEN - Soft, nontender, nondistended with normoactive BS Ext: warm, well perfused, intact peripheral pulses, no edema  NEURO:  Mental Status: AA&Ox4 Language: speech is clear.  Naming, repetition, fluency, and comprehension intact. Cranial Nerves: PERRL EOMI, visual fields full, no facial asymmetry, facial sensation intact, hearing intact, tongue/uvula/soft palate midline, normal sternocleidomastoid and trapezius muscle strength. No evidence of tongue atrophy or fibrillations Motor: 5/5 in all 4 extremities  Tone: is normal and bulk is normal Sensation- Intact to light touch bilaterally Coordination: FTN intact bilaterally, no ataxia in BLE. Gait- deferred  Labs I have reviewed labs in epic and the results pertinent to this consultation are:  CBC    Component Value Date/Time   WBC 12.3 (H) 11/05/2022 2247   RBC 5.99 (H) 11/05/2022 2247   HGB 16.9 11/05/2022 2247   HGB 16.9 09/01/2022 1408   HCT 50.8 11/05/2022 2247   HCT 50.5 09/01/2022 1408   PLT 336 11/05/2022 2247   PLT 251 09/01/2022 1408   MCV 84.8 11/05/2022 2247   MCV 87 09/01/2022 1408   MCH 28.2 11/05/2022 2247   MCHC 33.3 11/05/2022 2247   RDW 12.5 11/05/2022 2247   RDW 13.0 09/01/2022 1408   LYMPHSABS 2.9 09/01/2022 1408   MONOABS 0.4 10/13/2016 1240   EOSABS 0.2 09/01/2022 1408   BASOSABS 0.1 09/01/2022 1408    CMP     Component Value Date/Time   NA 140 11/05/2022  2247   NA 141 09/01/2022 1408   K 4.5 11/05/2022 2247   CL 97 (L) 11/05/2022 2247   CO2 24 11/05/2022 2247   GLUCOSE 133 (H) 11/05/2022 2247   BUN 29 (H) 11/05/2022 2247   BUN 13 09/01/2022 1408   CREATININE 1.44 (H) 11/05/2022 2247   CALCIUM 10.0 11/05/2022 2247   PROT 7.8 11/05/2022 2247   PROT 6.7 09/01/2022 1408   ALBUMIN 4.3 11/05/2022 2247   ALBUMIN 4.3 09/01/2022 1408  AST 20 11/05/2022 2247   ALT 25 11/05/2022 2247   ALKPHOS 85 11/05/2022 2247   BILITOT 0.9 11/05/2022 2247   BILITOT 0.3 09/01/2022 1408   GFRNONAA 57 (L) 11/05/2022 2247   GFRAA >60 12/26/2019 1220    Lipid Panel     Component Value Date/Time   CHOL 161 09/01/2022 1408   TRIG 160 (H) 09/01/2022 1408   HDL 46 09/01/2022 1408   CHOLHDL 3.5 09/01/2022 1408   LDLCALC 87 09/01/2022 1408    Lab Results  Component Value Date   HGBA1C 6.3 08/31/2022      Imaging I have reviewed the images obtained: none availabe   Assessment:   57 y.o. male with past medical history of anxiety and depression, HTN, HLD, PTSD, parkinsons disease, bipolar, DM, headaches, insomnia, left sided Bell's Palsy who presents for evaluation of worsening muscle jerking and inability to ambulate.  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  At the time of my evaluation, he did not have any clear myoclonus, though this is description of his movements from yesterday sound very confused consistent with myoclonus.  On my exam currently, he has very subtle athetosis of both the toes and hand, as well as a subtle  pill-rolling tremor at rest of his right hand.  I agree with psychiatry that I suspect that we are dealing with medication induced extraparametal side effects and I agree with the medication changes that they have instituted.  Trileptal I think is slightly less likely the etiology.  I think Abilify, and Wellbutrin are the most likely culprits.  We could try Ingrezza, but also could simply try holding off on his medications to see if these movements improved.  I would favor lamotrigine over valproic acid given that valproic acid can also contribute to parkinsonism, but will defer to psychiatry for ultimate choice as I do not feel overly strongly about that at this time.  His symptoms are already significantly improved, and as long as they continue to improve I am not sure it make any other changes but would wait to see if they continue to improve over the next few weeks.  For follow-up, I would recommend he follow-up with Kerin Salen of [facility] neurology who is a movement disorder specialist given his complexity.  Neurology will continue to be available on an as-needed basis, please call with further questions or concerns.  Ritta Slot, MD [facility] [phone]  If 7pm- 7am, please page neurology on call as listed in [facility].

## 2022-11-07 DIAGNOSIS — G253 Myoclonus: Principal | ICD-10-CM

## 2022-11-07 DIAGNOSIS — F4310 Post-traumatic stress disorder, unspecified: Secondary | ICD-10-CM

## 2022-11-07 DIAGNOSIS — F319 Bipolar disorder, unspecified: Secondary | ICD-10-CM

## 2022-11-07 LAB — CBC
HCT: 41.8 % (ref 39.0–52.0)
Hemoglobin: 13.9 g/dL (ref 13.0–17.0)
MCH: 28.6 pg (ref 26.0–34.0)
MCHC: 33.3 g/dL (ref 30.0–36.0)
MCV: 86 fL (ref 80.0–100.0)
Platelets: 219 10*3/uL (ref 150–400)
RBC: 4.86 MIL/uL (ref 4.22–5.81)
RDW: 12.8 % (ref 11.5–15.5)
WBC: 6.7 10*3/uL (ref 4.0–10.5)
nRBC: 0 % (ref 0.0–0.2)

## 2022-11-07 LAB — GLUCOSE, CAPILLARY
Glucose-Capillary: 65 mg/dL — ABNORMAL LOW (ref 70–99)
Glucose-Capillary: 70 mg/dL (ref 70–99)
Glucose-Capillary: 129 mg/dL — ABNORMAL HIGH (ref 70–99)
Glucose-Capillary: 147 mg/dL — ABNORMAL HIGH (ref 70–99)
Glucose-Capillary: 109 mg/dL — ABNORMAL HIGH (ref 70–99)
Glucose-Capillary: 154 mg/dL — ABNORMAL HIGH (ref 70–99)

## 2022-11-07 LAB — TSH: TSH: 0.995 u[IU]/mL (ref 0.350–4.500)

## 2022-11-07 LAB — BASIC METABOLIC PANEL
Anion gap: 9 (ref 5–15)
BUN: 19 mg/dL (ref 6–20)
CO2: 21 mmol/L — ABNORMAL LOW (ref 22–32)
Calcium: 8.4 mg/dL — ABNORMAL LOW (ref 8.9–10.3)
Chloride: 108 mmol/L (ref 98–111)
Creatinine, Ser: 0.97 mg/dL (ref 0.61–1.24)
GFR, Estimated: >60 mL/min (ref >60)
Glucose, Bld: 67 mg/dL — ABNORMAL LOW (ref 70–99)
Potassium: 3.8 mmol/L (ref 3.5–5.1)
Sodium: 138 mmol/L (ref 135–145)

## 2022-11-07 LAB — MAGNESIUM: Magnesium: 1.3 mg/dL — ABNORMAL LOW (ref 1.7–2.4)

## 2022-11-07 MED ORDER — INSULIN ASPART 100 UNIT/ML IJ SOLN: 0.0000 [IU] | Freq: Three times a day (TID) | INTRAMUSCULAR | Status: DC

## 2022-11-07 MED ORDER — MAGNESIUM SULFATE 4 GM/100ML IV SOLN: 4.0000 g | Freq: Once | INTRAVENOUS | Status: DC

## 2022-11-07 MED ORDER — MAGNESIUM SULFATE 4 GM/100ML IV SOLN
4.0000 g | Freq: Once | INTRAVENOUS | Status: AC
  Administered 2022-11-07: 4 g via INTRAVENOUS
  Filled 2022-11-07: qty 100

## 2022-11-07 MED ORDER — LACTATED RINGERS IV SOLN: INTRAVENOUS | Status: AC

## 2022-11-07 NOTE — Progress Notes (Signed)
CBG this morning was 67. Pt was given orange juices. Repeat glucose is 70/

## 2022-11-07 NOTE — Consult Note (Signed)
[facility] Face-to-Face Psychiatry Consult   Reason for Consult: ''acute ataxia, suspect polypharmacy.'' Referring Physician:  Mikey College, MD Patient Identification: [name] [name] MRN:  [id number] Principal Diagnosis: Myoclonus Diagnosis:  Principal Problem:   Myoclonus Active Problems:   MDD (major depressive disorder)   PTSD (post-traumatic stress disorder)   Bipolar I disorder, most recent episode depressed (HCC)   Essential hypertension   Ataxia   Total Time spent with patient: 1 hour  Subjective:   [name] [name] is a 57 y.o. male patient admitted with tremors and body twitching.  HPI:  57 y.o. male with medical history significant for Bipolar depression, PTSD, anxiety, IIDM, HTN, HLD, presented with worsening of muscle jerking, tremors and inability to ambulate. Patient is alert, awake and cooperative. He states that he was well until March 2024 when he noticed bilateral arm and hand tremors  which is getting worse and has progressively spread to his upper, lower extremities and his trunk. He states that he informed his psychiatric provider and despite medication adjustment, his symptoms have been getting worse. He was receiving, Abilify, Trileptal, Bupropion XL and Hydroxyzine. Patient denies psychosis, delusions, depression, mood swings, self harming thoughts, drugs and alcohol abuse. He states that his symptoms has been getting worse preventing him from ambulation.   Progress Note: 11/07/22 Patient seen face to face in his hospital room. He is alert, awake, oriented x4. He reports favorable response to his current treatment as evidenced by improved mental symptoms since the possible offending medications were discontinued. Thera has been dramatic improvement with hand tremors, muscle spasm, twitching and ambulation. Overall, he denies delusions, psychosis, depression, mood swings, anxiety, worries and self harming thoughts.   Past Psychiatric History: as above  Risk to Self:   denies  Risk to Others:  denies Prior Inpatient Therapy:  none reports Prior Outpatient Therapy:  yes  Past Medical History:  Past Medical History:  Diagnosis Date   Anxiety    Chest heaviness    Chills    Depression, major    Diabetes mellitus without complication (HCC)    Dizzy    Dyslipidemia    ED (erectile dysfunction)    Elevated CPK    HA (headache)    Hypertension    Hypertriglyceridemia    Insomnia    Kidney stone    Left-sided Bell's palsy    Morbid obesity (HCC)    Nausea    No appetite    Panniculitis    of right lower leg   Parkinson's disease    PTSD (post-traumatic stress disorder)    Weakness     Past Surgical History:  Procedure Laterality Date   NO PAST SURGERIES     Family History:  Family History  Problem Relation Age of Onset   Thyroid disease Brother    Depression Brother    Heart disease Mother    Kidney failure Father    Diabetes Father    Thyroid disease Sister    Mental illness Sister    Heart attack Maternal Grandfather    Heart failure Paternal Grandmother    Cancer Paternal Grandfather        bone   Family Psychiatric  History:  Social History:  Social History   Substance and Sexual Activity  Alcohol Use No     Social History   Substance and Sexual Activity  Drug Use No    Social History   Socioeconomic History   Marital status: Single    Spouse name: Not on file  Number of children: 0   Years of education: 12   Highest education level: Not on file  Occupational History   Not on file  Tobacco Use   Smoking status: Never   Smokeless tobacco: Never  Vaping Use   Vaping Use: Never used  Substance and Sexual Activity   Alcohol use: No   Drug use: No   Sexual activity: Not on file  Other Topics Concern   Not on file  Social History Narrative   Lives with brother in a one story home.  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    Social Determinants of Health   Financial Resource Strain:  Not on file  Food Insecurity: No Food Insecurity (11/06/2022)   Hunger Vital Sign    Worried About Running Out of Food in the Last Year: Never true    Ran Out of Food in the Last Year: Never true  Transportation Needs: No Transportation Needs (11/06/2022)   PRAPARE - Administrator, Civil Service (Medical): No    Lack of Transportation (Non-Medical): No  Physical Activity: Not on file  Stress: Not on file  Social Connections: Not on file   Additional Social History:    Allergies:  No Known Allergies  Labs:  Results for orders placed or performed during the hospital encounter of 11/05/22 (from the past 48 hour(s))  CBC     Status: Abnormal   Collection Time: 11/05/22 10:47 PM  Result Value Ref Range   WBC 12.3 (H) 4.0 - 10.5 K/uL   RBC 5.99 (H) 4.22 - 5.81 MIL/uL   Hemoglobin 16.9 13.0 - 17.0 g/dL   HCT 40.9 81.1 - 91.4 %   MCV 84.8 80.0 - 100.0 fL   MCH 28.2 26.0 - 34.0 pg   MCHC 33.3 30.0 - 36.0 g/dL   RDW 78.2 95.6 - 21.3 %   Platelets 336 150 - 400 K/uL   nRBC 0.0 0.0 - 0.2 %    Comment: Performed at [facility], [address] [place], [place] [address]  Comprehensive metabolic panel     Status: Abnormal   Collection Time: 11/05/22 10:47 PM  Result Value Ref Range   Sodium 140 135 - 145 mmol/L   Potassium 4.5 3.5 - 5.1 mmol/L   Chloride 97 (L) 98 - 111 mmol/L   CO2 24 22 - 32 mmol/L   Glucose, Bld 133 (H) 70 - 99 mg/dL    Comment: Glucose reference range applies only to samples taken after fasting for at least 8 hours.   BUN 29 (H) 6 - 20 mg/dL   Creatinine, Ser 8.46 (H) 0.61 - 1.24 mg/dL   Calcium 96.2 8.9 - 95.2 mg/dL   Total Protein 7.8 6.5 - 8.1 g/dL   Albumin 4.3 3.5 - 5.0 g/dL   AST 20 15 - 41 U/L   ALT 25 0 - 44 U/L   Alkaline Phosphatase 85 38 - 126 U/L   Total Bilirubin 0.9 0.3 - 1.2 mg/dL   GFR, Estimated 57 (L) >60 mL/min    Comment: (NOTE) Calculated using the CKD-EPI Creatinine Equation (2021)    Anion gap 19 (H) 5 - 15     Comment: Performed at [facility], [address], [place], [place] [address]  Magnesium     Status: None   Collection Time: 11/06/22  3:25 AM  Result Value Ref Range   Magnesium 1.7 1.7 - 2.4 mg/dL    Comment:  Performed at [facility], [address], [place], [place] [address]  CK     Status: None   Collection Time: 11/06/22  3:25 AM  Result Value Ref Range   Total CK 154 49 - 397 U/L    Comment: Performed at [facility], [address], [place], [place] [address]  Urinalysis, Routine w reflex microscopic -Urine, Clean Catch     Status: Abnormal   Collection Time: 11/06/22  5:22 AM  Result Value Ref Range   Color, Urine AMBER (A) YELLOW    Comment: BIOCHEMICALS MAY BE AFFECTED BY COLOR   APPearance HAZY (A) CLEAR   Specific Gravity, Urine 1.024 1.005 - 1.030   pH 5.0 5.0 - 8.0   Glucose, UA NEGATIVE NEGATIVE mg/dL   Hgb urine dipstick NEGATIVE NEGATIVE   Bilirubin Urine NEGATIVE NEGATIVE   Ketones, ur NEGATIVE NEGATIVE mg/dL   Protein, ur 30 (A) NEGATIVE mg/dL   Nitrite NEGATIVE NEGATIVE   Leukocytes,Ua NEGATIVE NEGATIVE   RBC / HPF 0-5 0 - 5 RBC/hpf   WBC, UA 0-5 0 - 5 WBC/hpf   Bacteria, UA FEW (A) NONE SEEN   Squamous Epithelial / HPF 0-5 0 - 5 /HPF   Mucus PRESENT    Hyaline Casts, UA PRESENT    Sperm, UA PRESENT     Comment: Performed at [facility], [address], [place], [place] [address]  Glucose, capillary     Status: Abnormal   Collection Time: 11/06/22  8:56 AM  Result Value Ref Range   Glucose-Capillary 138 (H) 70 - 99 mg/dL    Comment: Glucose reference range applies only to samples taken after fasting for at least 8 hours.  Ammonia     Status: None   Collection Time: 11/06/22  9:09 AM  Result Value Ref Range   Ammonia 17 9 - 35 umol/L    Comment: Performed at [facility], [address], [place], [place] [address]  Glucose, capillary     Status: Abnormal   Collection Time: 11/06/22 11:35 AM  Result Value Ref Range    Glucose-Capillary 126 (H) 70 - 99 mg/dL    Comment: Glucose reference range applies only to samples taken after fasting for at least 8 hours.  Glucose, capillary     Status: Abnormal   Collection Time: 11/06/22  4:13 PM  Result Value Ref Range   Glucose-Capillary 132 (H) 70 - 99 mg/dL    Comment: Glucose reference range applies only to samples taken after fasting for at least 8 hours.  Glucose, capillary     Status: Abnormal   Collection Time: 11/06/22  9:12 PM  Result Value Ref Range   Glucose-Capillary 106 (H) 70 - 99 mg/dL    Comment: Glucose reference range applies only to samples taken after fasting for at least 8 hours.  Magnesium     Status: Abnormal   Collection Time: 11/07/22  6:22 AM  Result Value Ref Range   Magnesium 1.3 (L) 1.7 - 2.4 mg/dL    Comment: Performed at [facility], [address] [place], [place] [address]  Basic metabolic panel     Status: Abnormal   Collection Time: 11/07/22  6:22 AM  Result Value Ref Range   Sodium 138 135 - 145 mmol/L   Potassium 3.8 3.5 - 5.1 mmol/L   Chloride 108 98 - 111 mmol/L   CO2 21 (L) 22 - 32 mmol/L   Glucose, Bld 67 (L)  70 - 99 mg/dL    Comment: Glucose reference range applies only to samples taken after fasting for at least 8 hours.   BUN 19 6 - 20 mg/dL   Creatinine, Ser 1.61 0.61 - 1.24 mg/dL   Calcium 8.4 (L) 8.9 - 10.3 mg/dL   GFR, Estimated >09 >60 mL/min    Comment: (NOTE) Calculated using the CKD-EPI Creatinine Equation (2021)    Anion gap 9 5 - 15    Comment: Performed at [facility], [address] [place], [place] [address]  CBC     Status: None   Collection Time: 11/07/22  6:22 AM  Result Value Ref Range   WBC 6.7 4.0 - 10.5 K/uL   RBC 4.86 4.22 - 5.81 MIL/uL   Hemoglobin 13.9 13.0 - 17.0 g/dL   HCT 81.1 91.4 - 78.2 %   MCV 86.0 80.0 - 100.0 fL   MCH 28.6 26.0 - 34.0 pg   MCHC 33.3 30.0 - 36.0 g/dL   RDW 95.6 21.3 - 08.6 %   Platelets 219 150 - 400 K/uL   nRBC 0.0 0.0 - 0.2 %     Comment: Performed at [facility], [address], [place], [place] [address]  Glucose, capillary     Status: Abnormal   Collection Time: 11/07/22  7:58 AM  Result Value Ref Range   Glucose-Capillary 65 (L) 70 - 99 mg/dL    Comment: Glucose reference range applies only to samples taken after fasting for at least 8 hours.  Glucose, capillary     Status: None   Collection Time: 11/07/22  8:03 AM  Result Value Ref Range   Glucose-Capillary 70 70 - 99 mg/dL    Comment: Glucose reference range applies only to samples taken after fasting for at least 8 hours.  Glucose, capillary     Status: Abnormal   Collection Time: 11/07/22  9:46 AM  Result Value Ref Range   Glucose-Capillary 129 (H) 70 - 99 mg/dL    Comment: Glucose reference range applies only to samples taken after fasting for at least 8 hours.    Current Facility-Administered Medications  Medication Dose Route Frequency Provider Last Rate Last Admin   aspirin EC tablet 81 mg  81 mg Oral Daily Mikey College T, MD   81 mg at 11/07/22 0941   atorvastatin (LIPITOR) tablet 20 mg  20 mg Oral Daily Mikey College T, MD   20 mg at 11/07/22 0941   clonazePAM (KLONOPIN) tablet 1 mg  1 mg Oral TID Thedore Mins, MD   1 mg at 11/07/22 0926   divalproex (DEPAKOTE) DR tablet 250 mg  250 mg Oral Q12H Claude Waldman, MD   250 mg at 11/07/22 1103   DULoxetine (CYMBALTA) DR capsule 20 mg  20 mg Oral Daily Mikey College T, MD   20 mg at 11/07/22 0931   enoxaparin (LOVENOX) injection 40 mg  40 mg Subcutaneous Q24H Mikey College T, MD   40 mg at 11/07/22 0930   hydrOXYzine (ATARAX) tablet 25 mg  25 mg Oral TID Thedore Mins, MD   25 mg at 11/07/22 9629   insulin aspart (novoLOG) injection 0-6 Units  0-6 Units Subcutaneous TID WC Leroy Sea, MD       lactated ringers infusion   Intravenous Continuous Leroy Sea, MD 100 mL/hr at 11/07/22 1005 New Bag at 11/07/22 1005   lisinopril (ZESTRIL) tablet 20 mg  20 mg Oral Daily Emeline General, MD  20 mg at 11/07/22 1610   magnesium sulfate IVPB 4 g 100 mL  4 g Intravenous Once Leroy Sea, MD 50 mL/hr at 11/07/22 1106 4 g at 11/07/22 1106   propranolol (INDERAL) tablet 10 mg  10 mg Oral TID Emeline General, MD   10 mg at 11/06/22 2142   valbenazine (INGREZZA) capsule 60 mg  60 mg Oral Daily Thedore Mins, MD   60 mg at 11/07/22 9604    Musculoskeletal: Strength & Muscle Tone: abnormal Gait & Station: unsteady Patient leans: N/A    Psychiatric Specialty Exam:  Presentation  General Appearance:  Appropriate for Environment  Eye Contact: Good  Speech: Clear and Coherent  Speech Volume: Normal  Handedness: Right   Mood and Affect  Mood: Euphoric  Affect: Appropriate   Thought Process  Thought Processes: Coherent  Descriptions of Associations:Intact  Orientation:None  Thought Content:Logical  History of Schizophrenia/Schizoaffective disorder:No data recorded Duration of Psychotic Symptoms:No data recorded Hallucinations:Hallucinations: None  Ideas of Reference:None  Suicidal Thoughts:Suicidal Thoughts: No  Homicidal Thoughts:Homicidal Thoughts: No   Sensorium  Memory: Immediate Good; Recent Good; Remote Good  Judgment: Intact  Insight: Good   Executive Functions  Concentration: Good  Attention Span: Good  Recall: Good  Fund of Knowledge: Good  Language: Good   Psychomotor Activity  Psychomotor Activity: Psychomotor Activity: Extrapyramidal Side Effects (EPS) Extrapyramidal Side Effects (EPS): Tardive Dyskinesia   Assets  Assets: Communication Skills; Desire for Improvement   Sleep  Sleep: Sleep: Fair   Physical Exam: Physical Exam Review of Systems  Psychiatric/Behavioral:  Negative for depression, hallucinations, memory loss, substance abuse and suicidal ideas. The patient is not nervous/anxious and does not have insomnia.    Blood pressure 133/71, pulse (!) 54, temperature 97.6 F (36.4 C),  temperature source Oral, resp. rate 19, height 5\' 10"  (1.778 m), weight 108.4 kg, SpO2 95 %. Body mass index is 34.29 kg/m.  Treatment Plan Summary: 57 year old male with history of  Bipolar depression, PTSD, anxiety, IIDM, HTN, HLD, presented with worsening of muscle jerking, tremors and inability to ambulate. All symptoms are much better following the discontinuation of the possible offending medications. Based on my re-evaluation today, patient seems to be stable mentally.  Plan/Recommendations: -Discontinue Abilify-it may cause tremors and muscle twitching -Discontinue Trileptal-there are reported cases of this medication causing tremors, Ataxia and muscle pain in higher doses -Discontinue Wellbutrin XL-potentially can cause tremors, muscle twitches and jerks -Continue Hydroxyzine to 25 mg tid as there were reported cases of high doses causing shakiness, tremors and muscle twitches -Continue Valproic acid 250 mg BID for Bipolar- not link to high risk muscle twitches -Continue Ingrezza 60 mg daily at bedtime for TD -Consider tapering patient off Clonazepam 1mg  tid -muscle relaxant/tremors/TD as tolerated - Consider referring patient to his outpatient psychiatrist upon discharge  Disposition: No evidence of imminent risk to self or others at present.   Patient does not meet criteria for psychiatric inpatient admission. Supportive therapy provided about ongoing stressors. Psychiatric service signing out  Thedore Mins, MD 11/07/2022 11:11 AM

## 2022-11-07 NOTE — Progress Notes (Signed)
PROGRESS NOTE                                                                                                                                                                                                             Patient Demographics:    [name] [name], is a 57 y.o. male, DOB - [birth year], RUE:[id number]  Outpatient Primary MD for the patient is Park Meo, [name]    LOS - 1  Admit date - 11/05/2022    Chief Complaint  Patient presents with   Tremors       Brief Narrative (HPI from H&P)     57 y.o. male with medical history significant of bipolar disorder, PTSD anxiety/depression, IIDM, HTN, HLD, presented with worsening of muscle jerking and unable to ambulate.  He was seen by neurology and psychiatry, thought to have extraparametal side effects from his psych medications namely Abilify and Wellbutrin, he was kept in the hospital for further workup.   Subjective:    [name] [name] today has, No headache, No chest pain, No abdominal pain - No Nausea, No new weakness tingling or numbness, no shortness of breath, twitching much improved feels a whole lot better.   Assessment  & Plan :    Generalized tremors and muscle twitching due to extraparametal side effects from Abilify and Wellbutrin combination which she was taking at home.  Seen by both psychiatry and neurology, currently offending medications have been held, he has been placed on combination of Ingrezza 60 twice a day, valproic acid 250 twice daily for bipolar disorder along with clonazepam 3 times daily for muscle spasms and tremors, will taper down clonazepam dose gradually, PT OT advance activity and monitor.  Post discharge follow-up with his psychiatrist outpatient along with Dr. Lurena Joiner Tat neurology.  Bipolar disorder, PTSD and anxiety.  See medication changes as above.  Not suicidal or homicidal, appreciate psych input.  Hypomagnesemia.   Replaced.  Hypertension.  Continue lisinopril.  DM type II.  For now on sliding scale, hold Amaryl and metformin.  Monitor.  Lab Results  Component Value Date   HGBA1C 6.3 08/31/2022   CBG (last 3)  Recent Labs    11/07/22 0758 11/07/22 0803 11/07/22 0946  GLUCAP 65* 70 129*         Condition - Extremely Guarded  Family Communication  : None  present  Code Status : Full code  Consults  : Neurology, psychiatry  PUD Prophylaxis :    Procedures  :            Disposition Plan  :    Status is: Inpatient  DVT Prophylaxis  :    enoxaparin (LOVENOX) injection 40 mg Start: 11/06/22 1000    Lab Results  Component Value Date   PLT 219 11/07/2022    Diet :  Diet Order             Diet heart healthy/carb modified Room service appropriate? Yes; Fluid consistency: Thin  Diet effective now                    Inpatient Medications  Scheduled Meds:  aspirin EC  81 mg Oral Daily   atorvastatin  20 mg Oral Daily   clonazePAM  1 mg Oral TID   divalproex  250 mg Oral Q12H   DULoxetine  20 mg Oral Daily   enoxaparin (LOVENOX) injection  40 mg Subcutaneous Q24H   hydrOXYzine  25 mg Oral TID   insulin aspart  0-6 Units Subcutaneous TID WC   lisinopril  20 mg Oral Daily   propranolol  10 mg Oral TID   valbenazine  60 mg Oral Daily   Continuous Infusions:  lactated ringers     magnesium sulfate bolus IVPB     PRN Meds:.  Antibiotics  :    Anti-infectives (From admission, onward)    None         Objective:   Vitals:   11/06/22 2142 11/06/22 2304 11/07/22 0400 11/07/22 0800  BP: 112/70 119/74  133/71  Pulse: 63 61 60 (!) 54  Resp:  18 17 19   Temp:  97.7 F (36.5 C) (!) 97.5 F (36.4 C) 97.6 F (36.4 C)  TempSrc:  Oral Oral Oral  SpO2:  95%  95%  Weight:      Height:        Wt Readings from Last 3 Encounters:  11/05/22 108.4 kg  10/13/22 108.4 kg  09/28/22 109.3 kg     Intake/Output Summary (Last 24 hours) at 11/07/2022  0947 Last data filed at 11/06/2022 2309 Gross per 24 hour  Intake 2140 ml  Output --  Net 2140 ml     Physical Exam  Awake Alert, No new F.N deficits, minimal upper extremity twitching [place].AT,PERRAL Supple Neck, No JVD,   Symmetrical Chest wall movement, Good air movement bilaterally, CTAB RRR,No Gallops,Rubs or new Murmurs,  +ve B.Sounds, Abd Soft, No tenderness,   No Cyanosis, Clubbing or edema      Data Review:    Recent Labs  Lab 11/05/22 2247 11/07/22 0622  WBC 12.3* 6.7  HGB 16.9 13.9  HCT 50.8 41.8  PLT 336 219  MCV 84.8 86.0  MCH 28.2 28.6  MCHC 33.3 33.3  RDW 12.5 12.8    Recent Labs  Lab 11/05/22 2247 11/06/22 0325 11/06/22 0909 11/07/22 0622  NA 140  --   --  138  K 4.5  --   --  3.8  CL 97*  --   --  108  CO2 24  --   --  21*  ANIONGAP 19*  --   --  9  GLUCOSE 133*  --   --  67*  BUN 29*  --   --  19  CREATININE 1.44*  --   --  0.97  AST 20  --   --   --  ALT 25  --   --   --   ALKPHOS 85  --   --   --   BILITOT 0.9  --   --   --   ALBUMIN 4.3  --   --   --   AMMONIA  --   --  17  --   MG  --  1.7  --  1.3*  CALCIUM 10.0  --   --  8.4*   Lab Results  Component Value Date   HGBA1C 6.3 08/31/2022      Signature  -   Susa Raring M.D on 11/07/2022 at 9:47 AM   -  To page go to [email or web address]

## 2022-11-07 NOTE — Evaluation (Signed)
Occupational Therapy Evaluation Patient Details Name: [name] [name] MRN: [id number] DOB: 01-17-1966 Today's Date: 11/07/2022   History of Present Illness [name] [name] is a 57 y.o. male who presented due to muscle jerking, tremors and inability to ambulate.  Suspect symptoms are due to medication induced extraparametal side effects. PMHx:  Bipolar depression, PTSD, anxiety, IIDM, HTN, HLD, PD   Clinical Impression   Pt evaluated s/p above admission list. Pt reports independence with ADL/IADLs, driving and functional mobility at baseline without use of AD. Pt presents this session with generalized weakness, decreased balance, fatigue from medication, and mild resting bilat hand tremors. Pt notes significant improvement in tremors. Pt currently requires setup A for seated UB ADLs and min A for LB ADLs. Pt completed STS transfer from EOB using RW with min A and verbal cues for hand placement. Pt completed room level mobility with RW and min guard A. Pt would benefit from continued acute OT services to maximize functional independence and facilitate transition back to pt's home environment with [facility] OT services upon discharge.       Recommendations for follow up therapy are one component of a multi-disciplinary discharge planning process, led by the attending physician.  Recommendations may be updated based on patient status, additional functional criteria and insurance authorization.   Assistance Recommended at Discharge Frequent or constant Supervision/Assistance  Patient can return home with the following A little help with walking and/or transfers;A little help with bathing/dressing/bathroom;Assistance with cooking/housework;Assist for transportation;Help with stairs or ramp for entrance    Functional Status Assessment  Patient has had a recent decline in their functional status and demonstrates the ability to make significant improvements in function in a reasonable and predictable amount of  time.  Equipment Recommendations  Other (comment) (pending acute progression)    Recommendations for Other Services       Precautions / Restrictions Precautions Precautions: Fall Restrictions Weight Bearing Restrictions: No      Mobility Bed Mobility Overal bed mobility: Needs Assistance Bed Mobility: Supine to Sit     Supine to sit: Supervision, HOB elevated          Transfers Overall transfer level: Needs assistance Equipment used: Rolling walker (2 wheels) Transfers: Sit to/from Stand Sit to Stand: Min assist           General transfer comment: STS transfer from EOB using RW with min A and min verbal cues for hand placement. Room level mobility using RW with min guar A.      Balance Overall balance assessment: Needs assistance Sitting-balance support: No upper extremity supported, Feet supported Sitting balance-Leahy Scale: Good Sitting balance - Comments: sitting EOB   Standing balance support: Bilateral upper extremity supported, Reliant on assistive device for balance Standing balance-Leahy Scale: Poor Standing balance comment: BUE support on RW for stability                           ADL either performed or assessed with clinical judgement   ADL Overall ADL's : Needs assistance/impaired Eating/Feeding: Modified independent;Sitting   Grooming: Set up;Sitting   Upper Body Bathing: Set up;Sitting   Lower Body Bathing: Minimal assistance;Sit to/from stand   Upper Body Dressing : Set up;Sitting   Lower Body Dressing: Minimal assistance;Sitting/lateral leans Lower Body Dressing Details (indicate cue type and reason): donned bilat socks at EOB with increased time, props BLEs on bed for LB dressing. Anticipate min A for full LB dressing Toilet Transfer: Rolling walker (  2 wheels);Regular Toilet;Minimal assistance Toilet Transfer Details (indicate cue type and reason): simulated Toileting- Clothing Manipulation and Hygiene: Minimal  assistance       Functional mobility during ADLs: Minimal assistance;Rolling walker (2 wheels) General ADL Comments: limited secondary to fatigue, generalized weakness     Vision Baseline Vision/History: 1 Wears glasses Ability to See in Adequate Light: 0 Adequate Vision Assessment?: No apparent visual deficits     Perception Perception Perception Tested?: No   Praxis Praxis Praxis tested?: Not tested    Pertinent Vitals/Pain Pain Assessment Pain Assessment: No/denies pain     Hand Dominance Right   Extremity/Trunk Assessment Upper Extremity Assessment Upper Extremity Assessment: Generalized weakness (bilat hand tremors noted at rest)   Lower Extremity Assessment Lower Extremity Assessment: Defer to PT evaluation   Cervical / Trunk Assessment Cervical / Trunk Assessment: Normal   Communication Communication Communication: No difficulties   Cognition Arousal/Alertness: Awake/alert Behavior During Therapy: Flat affect Overall Cognitive Status: Impaired/Different from baseline Area of Impairment: Attention, Following commands                   Current Attention Level: Sustained   Following Commands: Follows one step commands with increased time       General Comments: A+O x4, pt fatigued and reports this is side affect from medication     General Comments  VSS on RA; mild resting bilat hand tremors noted. No further tremors noted during mobility.    Exercises     Shoulder Instructions      Home Living Family/patient expects to be discharged to:: Private residence Living Arrangements: Other relatives (sister) Available Help at Discharge: Family;Available 24 hours/day Type of Home: House Home Access: Stairs to enter [employer] of Steps: 9 Entrance Stairs-Rails: Can reach both Home Layout: One level     Bathroom Shower/Tub: Chief Strategy Officer: Standard     Home Equipment: Agricultural consultant (2 wheels);Cane - single  point          Prior Functioning/Environment Prior Level of Function : Independent/Modified Independent;Driving             Mobility Comments: Independent without use of AD ADLs Comments: Independent with ADL/IADLs and driving        OT Problem List: Decreased strength;Decreased activity tolerance;Impaired balance (sitting and/or standing);Decreased cognition      OT Treatment/Interventions: Self-care/ADL training;Therapeutic exercise;DME and/or AE instruction;Energy conservation;Therapeutic activities;Cognitive remediation/compensation;Patient/family education;Balance training    OT Goals(Current goals can be found in the care plan section) Acute Rehab OT Goals Patient Stated Goal: to get stronger OT Goal Formulation: With patient Time For Goal Achievement: 11/21/22 Potential to Achieve Goals: Good ADL Goals Pt Will Perform Grooming: with modified independence;standing Pt Will Perform Lower Body Dressing: with modified independence;sit to/from stand Pt Will Transfer to Toilet: with modified independence;regular height toilet;ambulating Additional ADL Goal #1: Pt will independently follow 2-step functional task  OT Frequency: Min 2X/week    Co-evaluation              AM-PAC OT "6 Clicks" Daily Activity     Outcome Measure Help from another person eating meals?: None Help from another person taking care of personal grooming?: A Little Help from another person toileting, which includes using toliet, bedpan, or urinal?: A Little Help from another person bathing (including washing, rinsing, drying)?: A Little Help from another person to put on and taking off regular upper body clothing?: A Little Help from another person to put on and taking off  regular lower body clothing?: A Little 6 Click Score: 19   End of Session Equipment Utilized During Treatment: Gait belt;Rolling walker (2 wheels) Nurse Communication: Mobility status  Activity Tolerance: Patient limited by  fatigue Patient left: in chair;with call bell/phone within reach;with chair alarm set  OT Visit Diagnosis: Unsteadiness on feet (R26.81);Muscle weakness (generalized) (M62.81)                Time: 1914-7829 OT Time Calculation (min): 17 min Charges:  OT General Charges $OT Visit: 1 Visit OT Evaluation $OT Eval Moderate Complexity: 1 Mod  Sherley Bounds, OTS Acute Rehabilitation Services Office [phone] Secure Chat Communication Preferred   Sherley Bounds 11/07/2022, 1:29 PM

## 2022-11-07 NOTE — Evaluation (Signed)
Physical Therapy Evaluation Patient Details Name: [name] [name] MRN: [id number] DOB: 08-Oct-1965 Today's Date: 11/07/2022  History of Present Illness  [name] is a 57 y.o. male who presented due to muscle jerking, tremors and inability to ambulate.  Suspect symptoms are due to medication induced extraparametal side effects. PMHx:  Bipolar depression, PTSD, anxiety, IIDM, HTN, HLD, PD  Clinical Impression   Pt presents with generalized weakness, impaired balance, impaired activity tolerance. Pt to benefit from acute PT to address deficits. Pt ambulated hallway distance with use of RW and close guard for safety, typically pt is independent with mobility. Will benefit from acute and post-acute PT. PT to progress mobility as tolerated, and will continue to follow acutely.         Recommendations for follow up therapy are one component of a multi-disciplinary discharge planning process, led by the attending physician.  Recommendations may be updated based on patient status, additional functional criteria and insurance authorization.  Follow Up Recommendations       Assistance Recommended at Discharge Frequent or constant Supervision/Assistance  Patient can return home with the following  A little help with walking and/or transfers;A little help with bathing/dressing/bathroom    Equipment Recommendations None recommended by PT  Recommendations for Other Services       Functional Status Assessment Patient has had a recent decline in their functional status and demonstrates the ability to make significant improvements in function in a reasonable and predictable amount of time.     Precautions / Restrictions Precautions Precautions: Fall Restrictions Weight Bearing Restrictions: No      Mobility  Bed Mobility Overal bed mobility: Needs Assistance Bed Mobility: Sit to Supine       Sit to supine: Supervision, HOB elevated   General bed mobility comments: for safety, use  of bedrails    Transfers Overall transfer level: Needs assistance Equipment used: Rolling walker (2 wheels) Transfers: Sit to/from Stand Sit to Stand: Min guard           General transfer comment: safety    Ambulation/Gait Ambulation/Gait assistance: Min guard Gait Distance (Feet): 300 Feet Assistive device: Rolling walker (2 wheels) Gait Pattern/deviations: Step-through pattern, Decreased stride length, Trunk flexed Gait velocity: dcr     General Gait Details: cues for placement in RW and bilat foot clearance  Stairs            Wheelchair Mobility    Modified Rankin (Stroke Patients Only)       Balance Overall balance assessment: Needs assistance Sitting-balance support: No upper extremity supported, Feet supported Sitting balance-Leahy Scale: Good Sitting balance - Comments: sitting EOB   Standing balance support: Bilateral upper extremity supported, Reliant on assistive device for balance Standing balance-Leahy Scale: Poor Standing balance comment: BUE support on RW for stability                             Pertinent Vitals/Pain Pain Assessment Pain Assessment: No/denies pain    Home Living Family/patient expects to be discharged to:: Private residence Living Arrangements: Other relatives (sister) Available Help at Discharge: Family;Available 24 hours/day Type of Home: House Home Access: Stairs to enter Entrance Stairs-Rails: Can reach both Entrance Stairs-Number of Steps: 9   Home Layout: One level Home Equipment: Agricultural consultant (2 wheels);Cane - single point      Prior Function Prior Level of Function : Independent/Modified Independent;Driving  Mobility Comments: Independent without use of AD ADLs Comments: Independent with ADL/IADLs and driving     Hand Dominance   Dominant Hand: Right    Extremity/Trunk Assessment   Upper Extremity Assessment Upper Extremity Assessment: Defer to OT evaluation     Lower Extremity Assessment Lower Extremity Assessment: Generalized weakness (4/5 throughout)    Cervical / Trunk Assessment Cervical / Trunk Assessment: Normal  Communication   Communication: No difficulties  Cognition Arousal/Alertness: Awake/alert Behavior During Therapy: Flat affect Overall Cognitive Status: Impaired/Different from baseline Area of Impairment: Attention, Following commands                   Current Attention Level: Sustained   Following Commands: Follows one step commands with increased time       General Comments: A+O x4, pt fatigued and reports this is side effect from medication        General Comments General comments (skin integrity, edema, etc.): vss, tremors noted in hands at rest and LEs with end ROM    Exercises     Assessment/Plan    PT Assessment Patient needs continued PT services  PT Problem List Decreased strength;Decreased mobility;Decreased activity tolerance;Decreased balance;Decreased knowledge of use of DME;Pain;Decreased safety awareness       PT Treatment Interventions DME instruction;Therapeutic activities;Gait training;Therapeutic exercise;Patient/family education;Balance training;Stair training;Functional mobility training;Neuromuscular re-education    PT Goals (Current goals can be found in the Care Plan section)  Acute Rehab PT Goals PT Goal Formulation: With patient Time For Goal Achievement: 11/21/22 Potential to Achieve Goals: Good    Frequency Min 3X/week     Co-evaluation               AM-PAC PT "6 Clicks" Mobility  Outcome Measure Help needed turning from your back to your side while in a flat bed without using bedrails?: A Little Help needed moving from lying on your back to sitting on the side of a flat bed without using bedrails?: A Little Help needed moving to and from a bed to a chair (including a wheelchair)?: A Little Help needed standing up from a chair using your arms (e.g.,  wheelchair or bedside chair)?: A Little Help needed to walk in hospital room?: A Little Help needed climbing 3-5 steps with a railing? : A Little 6 Click Score: 18    End of Session   Activity Tolerance: Patient tolerated treatment well Patient left: with call bell/phone within reach;in bed;with bed alarm set Nurse Communication: Mobility status PT Visit Diagnosis: Other abnormalities of gait and mobility (R26.89);Muscle weakness (generalized) (M62.81)    Time: 1610-9604 PT Time Calculation (min) (ACUTE ONLY): 22 min   Charges:   PT Evaluation $PT Eval Low Complexity: 1 Low          Noely Kuhnle S, PT DPT Acute Rehabilitation Services Secure Chat Preferred  Office [phone]   Jadesola Poynter E Christain [place] 11/07/2022, 4:12 PM

## 2022-11-08 LAB — BASIC METABOLIC PANEL
Anion gap: 10 (ref 5–15)
BUN: 13 mg/dL (ref 6–20)
CO2: 24 mmol/L (ref 22–32)
Calcium: 8.5 mg/dL — ABNORMAL LOW (ref 8.9–10.3)
Chloride: 106 mmol/L (ref 98–111)
Creatinine, Ser: 1.01 mg/dL (ref 0.61–1.24)
GFR, Estimated: >60 mL/min (ref >60)
Glucose, Bld: 73 mg/dL (ref 70–99)
Potassium: 3.9 mmol/L (ref 3.5–5.1)
Sodium: 140 mmol/L (ref 135–145)

## 2022-11-08 LAB — CBC WITH DIFFERENTIAL/PLATELET
Abs Immature Granulocytes: 0.03 10*3/uL (ref 0.00–0.07)
Basophils Absolute: 0 10*3/uL (ref 0.0–0.1)
Basophils Relative: 1 %
Eosinophils Absolute: 0.2 10*3/uL (ref 0.0–0.5)
Eosinophils Relative: 3 %
HCT: 43.7 % (ref 39.0–52.0)
Hemoglobin: 14.8 g/dL (ref 13.0–17.0)
Immature Granulocytes: 0 %
Lymphocytes Relative: 44 %
Lymphs Abs: 3.3 10*3/uL (ref 0.7–4.0)
MCH: 29.1 pg (ref 26.0–34.0)
MCHC: 33.9 g/dL (ref 30.0–36.0)
MCV: 86 fL (ref 80.0–100.0)
Monocytes Absolute: 0.5 10*3/uL (ref 0.1–1.0)
Monocytes Relative: 7 %
Neutro Abs: 3.5 10*3/uL (ref 1.7–7.7)
Neutrophils Relative %: 45 %
Platelets: 217 10*3/uL (ref 150–400)
RBC: 5.08 MIL/uL (ref 4.22–5.81)
RDW: 12.6 % (ref 11.5–15.5)
WBC: 7.6 10*3/uL (ref 4.0–10.5)
nRBC: 0 % (ref 0.0–0.2)

## 2022-11-08 LAB — GLUCOSE, CAPILLARY
Glucose-Capillary: 64 mg/dL — ABNORMAL LOW (ref 70–99)
Glucose-Capillary: 72 mg/dL (ref 70–99)
Glucose-Capillary: 101 mg/dL — ABNORMAL HIGH (ref 70–99)
Glucose-Capillary: 112 mg/dL — ABNORMAL HIGH (ref 70–99)
Glucose-Capillary: 175 mg/dL — ABNORMAL HIGH (ref 70–99)

## 2022-11-08 LAB — MAGNESIUM: Magnesium: 1.7 mg/dL (ref 1.7–2.4)

## 2022-11-08 NOTE — Progress Notes (Signed)
Occupational Therapy Treatment & Discharge Patient Details Name: [name] [name] MRN: [id number] DOB: 1966/04/04 Today's Date: 11/08/2022   History of present illness [name] [name] is a 57 y.o. male who presented due to muscle jerking, tremors and inability to ambulate.  Suspect symptoms are due to medication induced extraparametal side effects. PMHx:  Bipolar depression, PTSD, anxiety, IIDM, HTN, HLD, PD   OT comments  Pt has met acute OT goals. Pt completed functional mobility without AD and generalized supervision for safety only, demonstrating mod I. Noted very limited arm swing during gait. He continues to present with mild BUE tremors, but they do not impact his ability to complete self care tasks, including eating. Educated pt to wear light wrist weight or get weighted utensils if that changes, he verbalized understanding.  Pt confirms he is at his functional baseline, OT to sign off. Recommend frequent mobility and OOB ADLs with mobility team and nsg.    Recommendations for follow up therapy are one component of a multi-disciplinary discharge planning process, led by the attending physician.  Recommendations may be updated based on patient status, additional functional criteria and insurance authorization.    Assistance Recommended at Discharge Frequent or constant Supervision/Assistance  Patient can return home with the following  A little help with walking and/or transfers;A little help with bathing/dressing/bathroom;Assistance with cooking/housework;Assist for transportation;Help with stairs or ramp for entrance   Equipment Recommendations  Other (comment)       Precautions / Restrictions Precautions Precautions: Fall Restrictions Weight Bearing Restrictions: No       Mobility Bed Mobility Overal bed mobility: Needs Assistance             General bed mobility comments: OOB upon arrival    Transfers Overall transfer level: Needs assistance Equipment used:  None Transfers: Sit to/from Stand Sit to Stand: Modified independent (Device/Increase time)                 Balance Overall balance assessment: Mild deficits observed, not formally tested   Sitting balance-Leahy Scale: Good       Standing balance-Leahy Scale: Fair         ADL either performed or assessed with clinical judgement   ADL Overall ADL's : Needs assistance/impaired         Functional mobility during ADLs: Supervision/safety General ADL Comments: no AD this date. pt ambulates with a slow, guarded gait. denies trouble wit heating or grooming (due to tremors) - pt agrees he is at his functional baseline    Extremity/Trunk Assessment Upper Extremity Assessment Upper Extremity Assessment: RUE deficits/detail;LUE deficits/detail RUE Deficits / Details: resting tremor R>L LUE Deficits / Details: resting tremor R>L   Lower Extremity Assessment Lower Extremity Assessment: Defer to PT evaluation        Vision   Vision Assessment?: No apparent visual deficits Additional Comments: wears glasses   Perception Perception Perception: Within Functional Limits   Praxis Praxis Praxis: Intact    Cognition Arousal/Alertness: Awake/alert Behavior During Therapy: Flat affect Overall Cognitive Status: No family/caregiver present to determine baseline cognitive functioning           General Comments: overall WFL for tasks assessed. pt is flat and slow to respond.              General Comments VSS on RA.    Pertinent Vitals/ Pain       Pain Assessment Pain Assessment: No/denies pain   Frequency  Min 2X/week        Progress  Toward Goals  OT Goals(current goals can now be found in the care plan section)  Progress towards OT goals: Goals met/education completed, patient discharged from OT  Acute Rehab OT Goals Patient Stated Goal: to go home OT Goal Formulation: With patient Time For Goal Achievement: 11/08/22 Potential to Achieve Goals:  Good ADL Goals Pt Will Perform Grooming: with modified independence;standing Pt Will Perform Lower Body Dressing: with modified independence;sit to/from stand Pt Will Transfer to Toilet: with modified independence;regular height toilet;ambulating Additional ADL Goal #1: Pt will independently follow 2-step functional task  Plan All goals met and education completed, patient discharged from OT services       AM-PAC OT "6 Clicks" Daily Activity     Outcome Measure   Help from another person eating meals?: None Help from another person taking care of personal grooming?: None Help from another person toileting, which includes using toliet, bedpan, or urinal?: None Help from another person bathing (including washing, rinsing, drying)?: None Help from another person to put on and taking off regular upper body clothing?: None Help from another person to put on and taking off regular lower body clothing?: None 6 Click Score: 24    End of Session Equipment Utilized During Treatment: Gait belt  OT Visit Diagnosis: Unsteadiness on feet (R26.81);Muscle weakness (generalized) (M62.81)   Activity Tolerance Patient tolerated treatment well   Patient Left in chair;with call bell/phone within reach;with chair alarm set   Nurse Communication Mobility status        Time: 1150-1206 OT Time Calculation (min): 16 min  Charges: OT General Charges $OT Visit: 1 Visit OT Treatments $Therapeutic Activity: 8-22 mins  Derenda Mis, OTR/L Acute Rehabilitation Services Office [phone] Secure Chat Communication Preferred   Donia Pounds 11/08/2022, 12:24 PM

## 2022-11-08 NOTE — Progress Notes (Signed)
Mobility Specialist Progress Note   11/08/22 1547  Mobility  Activity Ambulated with assistance in hallway  Level of Assistance Contact guard assist, steadying assist  Assistive Device None  Distance Ambulated (ft) 470 ft  Activity Response Tolerated well  Mobility Referral Yes  $Mobility charge 1 Mobility  Mobility Specialist Start Time (ACUTE ONLY) 1535  Mobility Specialist Stop Time (ACUTE ONLY) 1548  Mobility Specialist Time Calculation (min) (ACUTE ONLY) 13 min   Received pt sitting EOB having no complaints and agreeable to mobility. Pt was asymptomatic throughout ambulation and returned to room w/o fault. Left in BR w/ call bell in reach and all needs met.  Frederico Hamman Mobility Specialist Please contact via SecureChat or  Rehab office at [phone]

## 2022-11-08 NOTE — Progress Notes (Signed)
PROGRESS NOTE                                                                                                                                                                                                             Patient Demographics:    [name] [name], is a 57 y.o. male, DOB - 06-11-1965, ZOX:[id number]  Outpatient Primary MD for the patient is Park Meo, [name]    LOS - 2  Admit date - 11/05/2022    Chief Complaint  Patient presents with   Tremors       Brief Narrative (HPI from H&P)     57 y.o. male with medical history significant of bipolar disorder, PTSD anxiety/depression, IIDM, HTN, HLD, presented with worsening of muscle jerking and unable to ambulate.  He was seen by neurology and psychiatry, thought to have extraparametal side effects from his psych medications namely Abilify and Wellbutrin, he was kept in the hospital for further workup.   Subjective:   Patient in bed, appears comfortable, denies any headache, no fever, no chest pain or pressure, no shortness of breath , no abdominal pain. No new focal weakness.  Twitching and tremors have almost completely resolved.   Assessment  & Plan :    Generalized tremors and muscle twitching due to extraparametal side effects from Abilify and Wellbutrin combination which she was taking at home.  Seen by both psychiatry and neurology, currently offending medications have been held, he has been placed on combination of Ingrezza 60 twice a day, valproic acid 250 twice daily for bipolar disorder along with clonazepam 3 times daily for muscle spasms and tremors, will taper down clonazepam dose gradually, PT OT advance activity and monitor.  Post discharge follow-up with his psychiatrist outpatient along with Dr. Lurena Joiner Tat neurology.  Clinically he is much improved, if stable discharge on present regimen on 11/09/2022 with home health.  Bipolar disorder, PTSD and anxiety.   See medication changes as above.  Not suicidal or homicidal, appreciate psych input.  Hypomagnesemia.  Replaced.  Hypertension.  Continue lisinopril.  DM type II.  For now on sliding scale, hold Amaryl and metformin.  Monitor.  Lab Results  Component Value Date   HGBA1C 6.3 08/31/2022   CBG (last 3)  Recent Labs    11/07/22 1200 11/07/22 1559 11/07/22 2026  GLUCAP 147* 109* 154*  Condition - Extremely Guarded  Family Communication  : None present  Code Status : Full code  Consults  : Neurology, psychiatry  PUD Prophylaxis :    Procedures  :            Disposition Plan  :    Status is: Inpatient  DVT Prophylaxis  :    enoxaparin (LOVENOX) injection 40 mg Start: 11/06/22 1000    Lab Results  Component Value Date   PLT 217 11/08/2022    Diet :  Diet Order             Diet heart healthy/carb modified Room service appropriate? Yes; Fluid consistency: Thin  Diet effective now                    Inpatient Medications  Scheduled Meds:  aspirin EC  81 mg Oral Daily   atorvastatin  20 mg Oral Daily   clonazePAM  1 mg Oral TID   divalproex  250 mg Oral Q12H   DULoxetine  20 mg Oral Daily   enoxaparin (LOVENOX) injection  40 mg Subcutaneous Q24H   hydrOXYzine  25 mg Oral TID   insulin aspart  0-6 Units Subcutaneous TID WC   lisinopril  20 mg Oral Daily   propranolol  10 mg Oral TID   valbenazine  60 mg Oral Daily   Continuous Infusions:   PRN Meds:.  Antibiotics  :    Anti-infectives (From admission, onward)    None         Objective:   Vitals:   11/07/22 2200 11/08/22 0000 11/08/22 0200 11/08/22 0400  BP:  117/80  124/78  Pulse: (!) 59 78 (!) 53 (!) 52  Resp: 14 14 13 12   Temp:  (!) 97.3 F (36.3 C)  97.9 F (36.6 C)  TempSrc:  Oral  Oral  SpO2: 94% 94% 93% 94%  Weight:      Height:        Wt Readings from Last 3 Encounters:  11/05/22 108.4 kg  10/13/22 108.4 kg  09/28/22 109.3 kg    No intake or  output data in the 24 hours ending 11/08/22 0744    Physical Exam  Awake Alert, No new F.N deficits, no twitching [place].AT,PERRAL Supple Neck, No JVD,   Symmetrical Chest wall movement, Good air movement bilaterally, CTAB RRR,No Gallops,Rubs or new Murmurs,  +ve B.Sounds, Abd Soft, No tenderness,   No Cyanosis, Clubbing or edema      Data Review:    Recent Labs  Lab 11/05/22 2247 11/07/22 0622 11/08/22 0447  WBC 12.3* 6.7 7.6  HGB 16.9 13.9 14.8  HCT 50.8 41.8 43.7  PLT 336 219 217  MCV 84.8 86.0 86.0  MCH 28.2 28.6 29.1  MCHC 33.3 33.3 33.9  RDW 12.5 12.8 12.6  LYMPHSABS  --   --  3.3  MONOABS  --   --  0.5  EOSABS  --   --  0.2  BASOSABS  --   --  0.0    Recent Labs  Lab 11/05/22 2247 11/06/22 0325 11/06/22 0909 11/07/22 0622 11/08/22 0447  NA 140  --   --  138 140  K 4.5  --   --  3.8 3.9  CL 97*  --   --  108 106  CO2 24  --   --  21* 24  ANIONGAP 19*  --   --  9 10  GLUCOSE 133*  --   --  67* 73  BUN 29*  --   --  19 13  CREATININE 1.44*  --   --  0.97 1.01  AST 20  --   --   --   --   ALT 25  --   --   --   --   ALKPHOS 85  --   --   --   --   BILITOT 0.9  --   --   --   --   ALBUMIN 4.3  --   --   --   --   TSH  --   --   --  0.995  --   AMMONIA  --   --  17  --   --   MG  --  1.7  --  1.3* 1.7  CALCIUM 10.0  --   --  8.4* 8.5*   Lab Results  Component Value Date   HGBA1C 6.3 08/31/2022      Signature  -   Susa Raring M.D on 11/08/2022 at 7:44 AM   -  To page go to [email or web address]

## 2022-11-09 ENCOUNTER — Telehealth (HOSPITAL_COMMUNITY): Payer: Self-pay | Admitting: Pharmacy Technician

## 2022-11-09 ENCOUNTER — Encounter: Payer: Self-pay | Admitting: *Deleted

## 2022-11-09 ENCOUNTER — Other Ambulatory Visit (HOSPITAL_COMMUNITY): Payer: Self-pay

## 2022-11-09 LAB — CBC WITH DIFFERENTIAL/PLATELET
Abs Immature Granulocytes: 0.04 10*3/uL (ref 0.00–0.07)
Basophils Absolute: 0.1 10*3/uL (ref 0.0–0.1)
Basophils Relative: 1 %
Eosinophils Absolute: 0.3 10*3/uL (ref 0.0–0.5)
Eosinophils Relative: 3 %
HCT: 42.5 % (ref 39.0–52.0)
Hemoglobin: 14.3 g/dL (ref 13.0–17.0)
Immature Granulocytes: 1 %
Lymphocytes Relative: 42 %
Lymphs Abs: 3.7 10*3/uL (ref 0.7–4.0)
MCH: 28.5 pg (ref 26.0–34.0)
MCHC: 33.6 g/dL (ref 30.0–36.0)
MCV: 84.8 fL (ref 80.0–100.0)
Monocytes Absolute: 0.8 10*3/uL (ref 0.1–1.0)
Monocytes Relative: 9 %
Neutro Abs: 4 10*3/uL (ref 1.7–7.7)
Neutrophils Relative %: 44 %
Platelets: 231 10*3/uL (ref 150–400)
RBC: 5.01 MIL/uL (ref 4.22–5.81)
RDW: 12.6 % (ref 11.5–15.5)
WBC: 8.8 10*3/uL (ref 4.0–10.5)
nRBC: 0 % (ref 0.0–0.2)

## 2022-11-09 LAB — BASIC METABOLIC PANEL
Anion gap: 7 (ref 5–15)
BUN: 18 mg/dL (ref 6–20)
CO2: 29 mmol/L (ref 22–32)
Calcium: 9 mg/dL (ref 8.9–10.3)
Chloride: 103 mmol/L (ref 98–111)
Creatinine, Ser: 1.13 mg/dL (ref 0.61–1.24)
GFR, Estimated: >60 mL/min (ref >60)
Glucose, Bld: 112 mg/dL — ABNORMAL HIGH (ref 70–99)
Potassium: 3.7 mmol/L (ref 3.5–5.1)
Sodium: 139 mmol/L (ref 135–145)

## 2022-11-09 LAB — MAGNESIUM: Magnesium: 1.6 mg/dL — ABNORMAL LOW (ref 1.7–2.4)

## 2022-11-09 LAB — GLUCOSE, CAPILLARY: Glucose-Capillary: 101 mg/dL — ABNORMAL HIGH (ref 70–99)

## 2022-11-09 MED ORDER — CLONAZEPAM 0.5 MG PO TABS
0.5000 mg | ORAL_TABLET | Freq: Two times a day (BID) | ORAL | 0 refills | Status: DC
  Filled 2022-11-09: qty 60, 30d supply, fill #0

## 2022-11-09 MED ORDER — VALBENAZINE TOSYLATE 60 MG PO CAPS
60.0000 mg | ORAL_CAPSULE | Freq: Every day | ORAL | 0 refills | Status: DC
  Filled 2022-11-09: qty 30, 30d supply, fill #0

## 2022-11-09 MED ORDER — VALBENAZINE TOSYLATE 60 MG PO CAPS: 60.0000 mg | ORAL_CAPSULE | Freq: Every day | ORAL | 0 refills | Status: DC

## 2022-11-09 MED ORDER — DIVALPROEX SODIUM 250 MG PO DR TAB
250.0000 mg | DELAYED_RELEASE_TABLET | Freq: Two times a day (BID) | ORAL | 0 refills | Status: DC
  Filled 2022-11-09: qty 60, 30d supply, fill #0

## 2022-11-09 MED ORDER — MAGNESIUM SULFATE 4 GM/100ML IV SOLN
4.0000 g | Freq: Once | INTRAVENOUS | Status: AC
  Administered 2022-11-09: 4 g via INTRAVENOUS
  Filled 2022-11-09: qty 100

## 2022-11-09 NOTE — Progress Notes (Signed)
Pt seen at 08/31/22 screening event where his b/p was 106/89; his blood sugar was 223, and his A1C was 6.3. At the time of the event, the pt did not report any SDOH insecurities and was seen by the mobile unit provider, [name] Capes, NP. During the first post event follow up, pt noted he no longer had a PCP and was able to get needed refills from the mobile unit provider. During the second event f/u, chart review indicates that pt was able to see [name] [name] again on 09/28/22 and then established care with [name] Bushman, [name] at [facility] on 10/12/21. Per [facility], pt did not get to reconnect to his behavioral health provider during this interim and was admitted for medication adjustments for his mental health conditions due to side effects/interactions of these meds. Pt is currently being d/c with both ongoing psych and neurology consults, and is now established with a PCP with whom he has a future appt on 01/13/23. No additional health equity team support scheduled at this time.

## 2022-11-09 NOTE — Telephone Encounter (Signed)
Pharmacy Patient Advocate Encounter   Received notification that prior authorization for [facility] 60MG  capsules is required/requested.    PA submitted to [facility] MEDICAID via CoverMyMeds Key/confirmation #/EOC [id number] Status is pending

## 2022-11-09 NOTE — Progress Notes (Signed)
Physical Therapy Treatment Patient Details Name: [name] [name] MRN: [id number] DOB: Apr 10, 1966 Today's Date: 11/09/2022   History of Present Illness [name] [name] is a 57 y.o. male who presented due to muscle jerking, tremors and inability to ambulate.  Suspect symptoms are due to medication induced extraparametal side effects. PMHx:  Bipolar depression, PTSD, anxiety, IIDM, HTN, HLD, PD    PT Comments  Pt tolerated today's session well, progressing to hallway ambulation with no AD and supervision, able to perform stairs with minG for safety. Pt reports resolution of his tremors, continues with flat affect but may be his baseline. Pt reports feeling almost back to his mobility baseline, just feels his gait is slower. Acute PT will follow up during admission to progress fully, discharge recommendations updated to no needs, pt agreeable. Will follow as appropriate.      Assistance Recommended at Discharge PRN  If plan is discharge home, recommend the following:  Can travel by private vehicle    A little help with walking and/or transfers;A little help with bathing/dressing/bathroom;Help with stairs or ramp for entrance      Equipment Recommendations  None recommended by PT    Recommendations for Other Services       Precautions / Restrictions Precautions Precautions: Fall Restrictions Weight Bearing Restrictions: No     Mobility  Bed Mobility Overal bed mobility: Needs Assistance Bed Mobility: Supine to Sit     Supine to sit: Supervision, HOB elevated     General bed mobility comments: supervision for safety    Transfers Overall transfer level: Needs assistance Equipment used: None Transfers: Sit to/from Stand Sit to Stand: Modified independent (Device/Increase time)                Ambulation/Gait Ambulation/Gait assistance: Supervision Gait Distance (Feet): 300 Feet Assistive device: None Gait Pattern/deviations: Step-through pattern, Decreased stride  length, Narrow base of support Gait velocity: mildly decreased     General Gait Details: supervision provided for safety but no overt LOB, slow steady gait, cueing for head turns with ambulation for further challenge but pt only minimally moving eyes. Reports gait feels close to his baseline but with decreased gait speed   Stairs Stairs: Yes Stairs assistance: Min guard Stair Management: No rails, Step to pattern, Forwards Number of Stairs: 2 General stair comments: minG provided for safety and balance, pt completing 2 steps to simulate his home entrance   Wheelchair Mobility     Tilt Bed    Modified Rankin (Stroke Patients Only)       Balance Overall balance assessment: Needs assistance Sitting-balance support: No upper extremity supported, Feet supported Sitting balance-Leahy Scale: Good     Standing balance support: Reliant on assistive device for balance, No upper extremity supported Standing balance-Leahy Scale: Fair Standing balance comment: ambulating with supervision for safety                            Cognition Arousal/Alertness: Awake/alert Behavior During Therapy: Flat affect Overall Cognitive Status: No family/caregiver present to determine baseline cognitive functioning                                 General Comments: continues with flat affect and mildly decreased response time, likely close to his baseline        Exercises      General Comments General comments (skin integrity, edema, etc.): VSS on  room air      Pertinent Vitals/Pain Pain Assessment Pain Assessment: No/denies pain    Home Living                          Prior Function            PT Goals (current goals can now be found in the care plan section) Acute Rehab PT Goals Patient Stated Goal: go home PT Goal Formulation: With patient Time For Goal Achievement: 11/21/22 Potential to Achieve Goals: Good Progress towards PT goals:  Progressing toward goals    Frequency    Min 3X/week      PT Plan Discharge plan needs to be updated    Co-evaluation              AM-PAC PT "6 Clicks" Mobility   Outcome Measure  Help needed turning from your back to your side while in a flat bed without using bedrails?: None Help needed moving from lying on your back to sitting on the side of a flat bed without using bedrails?: A Little Help needed moving to and from a bed to a chair (including a wheelchair)?: None Help needed standing up from a chair using your arms (e.g., wheelchair or bedside chair)?: None Help needed to walk in hospital room?: A Little Help needed climbing 3-5 steps with a railing? : A Little 6 Click Score: 21    End of Session Equipment Utilized During Treatment: Gait belt Activity Tolerance: Patient tolerated treatment well Patient left: with call bell/phone within reach;in chair Nurse Communication: Mobility status PT Visit Diagnosis: Other abnormalities of gait and mobility (R26.89);Muscle weakness (generalized) (M62.81)     Time: 1610-9604 PT Time Calculation (min) (ACUTE ONLY): 11 min  Charges:    $Gait Training: 8-22 mins PT General Charges $$ ACUTE PT VISIT: 1 Visit                     Lindalou Hose, PT DPT Acute Rehabilitation Services Office [phone]    Leonie Man 11/09/2022, 10:18 AM

## 2022-11-09 NOTE — Discharge Summary (Signed)
[name] [name] RUE:[id number] DOB: Sep 14, 1965 DOA: 11/05/2022  PCP: Park Meo, [name]  Admit date: 11/05/2022  Discharge date: 11/09/2022  Admitted From: Home   Disposition:  Home   Recommendations for Outpatient Follow-up:   Follow up with PCP in 1-2 weeks  PCP Please obtain BMP/CBC, 2 view CXR in 1week,  (see Discharge instructions)   PCP Please follow up on the following pending results: Check BMP, magnesium in 5 to 7 days, must follow-up with his psychiatrist in 5 to 7 days as well.   Home Health: PT, OT   Equipment/Devices: as below  Consultations: Neuro, Psych Discharge Condition: Stable    CODE STATUS: Full    Diet Recommendation: Heart Healthy Low Carb  Diet Order             Diet heart healthy/carb modified Room service appropriate? Yes; Fluid consistency: Thin  Diet effective now                    Chief Complaint  Patient presents with   Tremors     Brief history of present illness from the day of admission and additional interim summary    57 y.o. male with medical history significant of bipolar disorder, PTSD anxiety/depression, IIDM, HTN, HLD, presented with worsening of muscle jerking and unable to ambulate.  He was seen by neurology and psychiatry, thought to have extraparametal side effects from his psych medications namely Abilify and Wellbutrin, he was kept in the hospital for further workup.                                                                   Hospital Course   Generalized tremors and muscle twitching due to extraparametal side effects from Abilify and Wellbutrin combination which she was taking at home.  Seen by both psychiatry and neurology, currently offending medications have been held, he has been placed on combination of Ingrezza 60 once a day, valproic acid  250 twice daily for bipolar disorder along with low-dose Cymbalta, clonazepam BID for anxiety, muscle spasms and tremors, now doing well with PT OT and is symptom-free feeling close to baseline.  Post discharge follow-up with his psychiatrist outpatient along with Dr. Lurena Joiner Tat neurology.  Clinically he is much improved, discharge on present regimen on 11/09/2022 with home health.   Bipolar disorder, PTSD and anxiety.  See medication changes as above.  Currently on Cymbalta 20 mg and low-dose benzodiazepine, not suicidal or homicidal, appreciate psych input.  Post discharge PCP to arrange for psych follow-up within the next 5 to 7 days.   Hypomagnesemia.  Replaced.   Hypertension.  Continue lisinopril.   DM type II.  Home regimen as before requested to check CBGs q. ACH S, A1c was 6.3.    Discharge diagnosis  Principal Problem:   Myoclonus Active Problems:   MDD (major depressive disorder)   PTSD (post-traumatic stress disorder)   Bipolar I disorder, most recent episode depressed [facility])   Essential hypertension   Ataxia    Discharge instructions    Discharge Instructions     Discharge instructions   Complete by: As directed    Follow with Primary MD Park Meo, [name] along with your psychiatrist in 7 days   Get CBC, CMP, magnesium -  checked next visit with your primary MD   Activity: As tolerated with Full fall precautions use walker/cane & assistance as needed  Disposition Home    Diet: Heart Healthy low carbohydrate diet, check CBGs q. [facility]  Special Instructions: If you have smoked or chewed Tobacco  in the last 2 yrs please stop smoking, stop any regular Alcohol  and or any Recreational drug use.  On your next visit with your primary care physician please Get Medicines reviewed and adjusted.  Please request your Prim.MD to go over all Hospital Tests and Procedure/Radiological results at the follow up, please get all Hospital records sent to your Prim MD by  signing hospital release before you go home.  If you experience worsening of your admission symptoms, develop shortness of breath, life threatening emergency, suicidal or homicidal thoughts you must seek medical attention immediately by calling 911 or calling your MD immediately  if symptoms less severe.  You Must read complete instructions/literature along with all the possible adverse reactions/side effects for all the Medicines you take and that have been prescribed to you. Take any new Medicines after you have completely understood and accpet all the possible adverse reactions/side effects.   Increase activity slowly   Complete by: As directed        Discharge Medications   Allergies as of 11/09/2022   No Known Allergies      Medication List     STOP taking these medications    ALPRAZolam 0.5 MG tablet Commonly known as: XANAX   ARIPiprazole 30 MG tablet Commonly known as: ABILIFY   buPROPion 150 MG 24 hr tablet Commonly known as: WELLBUTRIN XL   buPROPion 300 MG 24 hr tablet Commonly known as: WELLBUTRIN XL   Oxcarbazepine 300 MG tablet Commonly known as: TRILEPTAL   pantoprazole 20 MG tablet Commonly known as: PROTONIX   sucralfate 1 GM/10ML suspension Commonly known as: Carafate       TAKE these medications    aspirin EC 81 MG tablet Take 81 mg by mouth daily.   atorvastatin 20 MG tablet Commonly known as: LIPITOR Take 1 tablet (20 mg total) by mouth daily.   clonazePAM 0.5 MG tablet Commonly known as: KLONOPIN Take 1 tablet (0.5 mg total) by mouth 2 (two) times daily.   divalproex 250 MG DR tablet Commonly known as: DEPAKOTE Take 1 tablet (250 mg total) by mouth every 12 (twelve) hours.   DULoxetine 20 MG capsule Commonly known as: CYMBALTA TAKE 1 CAPSULE BY MOUTH DAILY What changed:  when to take this Another medication with the same name was removed. Continue taking this medication, and follow the directions you see here.   glimepiride 4  MG tablet Commonly known as: AMARYL Take 1 tablet (4 mg total) by mouth 2 (two) times daily.   hydrOXYzine 50 MG tablet Commonly known as: ATARAX TAKE 1 TABLET BY MOUTH FOUR TIMES A DAY & TAKE 2 TABLETS EVERY NIGHT AT BEDTIME FOR ANXIETY, PANIC, AGITATION, AND SLEEP What changed:  how much to take how to take this when to take this additional instructions   lisinopril 20 MG tablet Commonly known as: ZESTRIL Take 1 tablet (20 mg total) by mouth daily.   meloxicam 7.5 MG tablet Commonly known as: MOBIC Take 1 tablet (7.5 mg total) by mouth daily. What changed:  when to take this reasons to take this   metFORMIN 500 MG tablet Commonly known as: GLUCOPHAGE Take 1 tablet (500 mg total) by mouth 2 (two) times daily. What changed: how much to take   omeprazole 20 MG tablet Commonly known as: PRILOSEC OTC Take 20 mg by mouth daily as needed (for reflux).   propranolol 10 MG tablet Commonly known as: INDERAL Take 1 tablet (10 mg total) by mouth 3 (three) times daily.   sildenafil 20 MG tablet Commonly known as: REVATIO Take 20 mg by mouth daily as needed (as directed for E.D.).   valbenazine 60 MG capsule Commonly known as: INGREZZA Take 1 capsule (60 mg total) by mouth daily.   Vitamin D3 1000 units Caps Take 1,000 Units by mouth daily.               Durable Medical Equipment  (From admission, onward)           Start     Ordered   11/09/22 0740  For home use only DME Walker rolling  Once       Comments: 5 wheel  Question Answer Comment  Walker: With 5 Inch Wheels   Patient needs a walker to treat with the following condition Weakness      11/09/22 0739             Follow-up Information     Park Meo, [name]. Schedule an appointment as soon as possible for a visit in 1 week(s).   Specialty: Family Medicine Why: And your psychiatrist within a week of discharge Contact information: [address] [place] [address] [phone]          Tat, Octaviano Batty, DO. Schedule an appointment as soon as possible for a visit in 1 week(s).   Specialty: Neurology Contact information: [address] [place]  [address] [place] [address] [phone]                 Major procedures and Radiology Reports - PLEASE review detailed and final reports thoroughly  -        No results found.  Micro Results    No results found for this or any previous visit (from the past 240 hour(s)).  Today   Subjective    [name] [name] today has no headache,no chest abdominal pain,no new weakness tingling or numbness, feels much better wants to go home today.    Objective   Blood pressure 121/72, pulse (!) 59, temperature 97.8 F (36.6 C), temperature source Oral, resp. rate 15, height 5\' 10"  (1.778 m), weight 108.4 kg, SpO2 94 %.  No intake or output data in the 24 hours ending 11/09/22 0745  Exam  Awake Alert, No new F.N deficits,    [place].AT,PERRAL Supple Neck,   Symmetrical Chest wall movement, Good air movement bilaterally, CTAB RRR,No Gallops,   +ve B.Sounds, Abd Soft, Non tender,  No Cyanosis, Clubbing or edema    Data Review   Recent Labs  Lab 11/05/22 2247 11/07/22 0622 11/08/22 0447 11/09/22 0326  WBC 12.3* 6.7 7.6 8.8  HGB 16.9 13.9 14.8 14.3  HCT 50.8 41.8 43.7 42.5  PLT 336 219 217 231  MCV 84.8 86.0 86.0 84.8  MCH 28.2 28.6 29.1 28.5  MCHC 33.3 33.3 33.9 33.6  RDW 12.5 12.8 12.6 12.6  LYMPHSABS  --   --  3.3 3.7  MONOABS  --   --  0.5 0.8  EOSABS  --   --  0.2 0.3  BASOSABS  --   --  0.0 0.1    Recent Labs  Lab 11/05/22 2247 11/06/22 0325 11/06/22 0909 11/07/22 0622 11/08/22 0447 11/09/22 0326  NA 140  --   --  138 140 139  K 4.5  --   --  3.8 3.9 3.7  CL 97*  --   --  108 106 103  CO2 24  --   --  21* 24 29  ANIONGAP 19*  --   --  9 10 7   GLUCOSE 133*  --   --  67* 73 112*  BUN 29*  --   --  19 13 18   CREATININE 1.44*  --   --  0.97 1.01 1.13  AST 20  --   --   --   --   --    ALT 25  --   --   --   --   --   ALKPHOS 85  --   --   --   --   --   BILITOT 0.9  --   --   --   --   --   ALBUMIN 4.3  --   --   --   --   --   TSH  --   --   --  0.995  --   --   AMMONIA  --   --  17  --   --   --   MG  --  1.7  --  1.3* 1.7 1.6*  CALCIUM 10.0  --   --  8.4* 8.5* 9.0    Total Time in preparing paper work, data evaluation and todays exam - 35 minutes  Signature  -    Susa Raring M.D on 11/09/2022 at 7:45 AM   -  To page go to [email or web address]

## 2022-11-09 NOTE — Telephone Encounter (Signed)
Pharmacy Patient Advocate Encounter  Received notification from [facility] that Prior Authorization for [facility] 60MG  capsules has been DENIED because  .your provider did not send [place] information we requested.  On 11/09/2022, we asked your provider for the following important facts or documents: Per your health plan's criteria, this drug is covered if you meet the following: (1) Your doctor has completed a baseline evaluation (using one of the following: Abnormal involuntary movement scale or extrapyramidal symptoms rating scale).  PA #/Case ID/Reference #: [id number]

## 2022-11-12 ENCOUNTER — Other Ambulatory Visit (HOSPITAL_COMMUNITY): Payer: Self-pay | Admitting: Psychiatry

## 2022-11-12 ENCOUNTER — Other Ambulatory Visit: Payer: Self-pay | Admitting: Family Medicine

## 2022-11-12 DIAGNOSIS — F3130 Bipolar disorder, current episode depressed, mild or moderate severity, unspecified: Secondary | ICD-10-CM

## 2022-11-12 DIAGNOSIS — I10 Essential (primary) hypertension: Secondary | ICD-10-CM

## 2022-11-12 DIAGNOSIS — F411 Generalized anxiety disorder: Secondary | ICD-10-CM

## 2022-11-12 NOTE — Telephone Encounter (Signed)
Requested medication (s) are due for refill today - yes  Requested medication (s) are on the active medication list -yes  Future visit scheduled -yes  Last refill: 08/31/22 #30 1RF  Notes to clinic: outside provider- mobile unit- sent for review   Requested Prescriptions  Pending Prescriptions Disp Refills   lisinopril (ZESTRIL) 20 MG tablet [Pharmacy Med Name: Lisinopril 20MG  TABS] 30 tablet 1    Sig: TAKE 1 TABLET (20 MG TOTAL) BY MOUTH DAILY.     Cardiovascular:  ACE Inhibitors Failed - 11/12/2022  4:28 PM      Failed - Valid encounter within last 6 months    Recent Outpatient Visits   None     Future Appointments             In 5 days Park Meo, [name] [facility]   In 3 weeks Hoy Register, MD [facility]   In 2 months Dimas Aguas, Binnie Rail, [name] [place] [facility]            Passed - Cr in normal range and within 180 days    Creatinine, Ser  Date Value Ref Range Status  11/09/2022 1.13 0.61 - 1.24 mg/dL Final   Creatinine, Urine  Date Value Ref Range Status  10/13/2022 338 (H) 20 - 320 mg/dL Final    Comment:    Verified by repeat analysis. .          Passed - K in normal range and within 180 days    Potassium  Date Value Ref Range Status  11/09/2022 3.7 3.5 - 5.1 mmol/L Final         Passed - Patient is not pregnant      Passed - Last BP in normal range    BP Readings from Last 1 Encounters:  11/09/22 121/72            Requested Prescriptions  Pending Prescriptions Disp Refills   lisinopril (ZESTRIL) 20 MG tablet [Pharmacy Med Name: Lisinopril 20MG  TABS] 30 tablet 1    Sig: TAKE 1 TABLET (20 MG TOTAL) BY MOUTH DAILY.     Cardiovascular:  ACE Inhibitors Failed - 11/12/2022  4:28 PM      Failed - Valid encounter within last 6 months    Recent Outpatient Visits   None     Future Appointments             In 5 days Park Meo, [name] [facility]   In 3 weeks Hoy Register, MD [facility]   In 2 months Dimas Aguas, Binnie Rail, [name] [place] [facility]            Passed - Cr in normal range and within 180 days    Creatinine, Ser  Date Value Ref Range Status  11/09/2022 1.13 0.61 - 1.24 mg/dL Final   Creatinine, Urine  Date Value Ref Range Status  10/13/2022 338 (H) 20 - 320 mg/dL Final    Comment:    Verified by repeat analysis. .          Passed - K in normal range and within 180 days    Potassium  Date Value Ref Range Status  11/09/2022 3.7 3.5 - 5.1 mmol/L Final         Passed - Patient is not pregnant  Passed - Last BP in normal range    BP Readings from Last 1 Encounters:  11/09/22 121/72

## 2022-11-16 ENCOUNTER — Other Ambulatory Visit: Payer: Self-pay | Admitting: Family Medicine

## 2022-11-17 ENCOUNTER — Encounter: Payer: Self-pay | Admitting: Family Medicine

## 2022-11-17 ENCOUNTER — Ambulatory Visit (INDEPENDENT_AMBULATORY_CARE_PROVIDER_SITE_OTHER): Payer: No Typology Code available for payment source | Admitting: Family Medicine

## 2022-11-17 VITALS — BP 122/86 | HR 65 | Temp 98.2°F | Ht 70.0 in | Wt 232.0 lb

## 2022-11-17 DIAGNOSIS — Z23 Encounter for immunization: Secondary | ICD-10-CM

## 2022-11-17 DIAGNOSIS — G253 Myoclonus: Secondary | ICD-10-CM | POA: Diagnosis not present

## 2022-11-17 NOTE — Assessment & Plan Note (Signed)
Recheck Mg and CMP today

## 2022-11-17 NOTE — Assessment & Plan Note (Addendum)
Symptoms back to baseline for this patient with no new complaints. He has modified his medication regimen as instructed per his hospital discharge instructions. He reports his anxiety is stable on current regimen and he is seeing Psychiatry next week. He is in need of PA for Ingrezza to resume this. Referral placed today for Neurology. Will also order CBC and CXR per hospitalist request at discharge. Instructed to seek medical care for new or worsening symptoms.

## 2022-11-17 NOTE — Progress Notes (Signed)
Subjective:  HPI: [name] [name] is a 57 y.o. male presenting on 11/17/2022 for No chief complaint on file.   HPI Patient is in today for hospital follow-up. Will order CBC, CMP with Mag, and CXR per discharge note. Patient needs referral to Neurology with Dr Tat to schedule appointment. He does have an appointment with Psychiatry on 11/23/2022. He also needs a PA for his Ingrezza to get insurance coverage, he has not taken this in 10 days. He reports his tremor is back to his baseline prior to hospitalization.   ER FOLLOW UP Time since discharge: 8 days Hospital/facility: [facility] Diagnosis: Myoclonus r/t extrapyramidal effects of medication Procedures/tests: CBC, CMP, BG, TSH, UA, ammonia Consultants: Psychiatry, Neurology New medications: Ingrezza 60mg  daily, Valprioc Acid 250mg  BID, Cymbalta 20mg  nightly, Clonazepam PRN Discontinue: Xanax, Abilify, Wellbutrin, Trileptal, Protonix, Carafate Discharge instructions: Follow up with psychiatry and neurology, follow-up labs, home health PT/OT, low carb heart healthy diet, activity as tolerated Status: better   Review of Systems  Respiratory:  Negative for shortness of breath.   Cardiovascular:  Negative for chest pain.  Neurological:  Positive for tremors. Negative for tingling, sensory change, weakness and headaches.  Psychiatric/Behavioral: Negative.      Relevant past medical history reviewed and updated as indicated.   Past Medical History:  Diagnosis Date   Anxiety    Chest heaviness    Chills    Depression, major    Diabetes mellitus without complication (HCC)    Dizzy    Dyslipidemia    ED (erectile dysfunction)    Elevated CPK    HA (headache)    Hypertension    Hypertriglyceridemia    Insomnia    Kidney stone    Left-sided Bell's palsy    Morbid obesity (HCC)    Nausea    No appetite    Panniculitis    of right lower leg   Parkinson's disease    PTSD (post-traumatic stress disorder)    Weakness       Past Surgical History:  Procedure Laterality Date   NO PAST SURGERIES      Allergies and medications reviewed and updated.   Current Outpatient Medications:    aspirin EC 81 MG tablet, Take 81 mg by mouth daily., Disp: , Rfl:    atorvastatin (LIPITOR) 20 MG tablet, Take 1 tablet (20 mg total) by mouth daily., Disp: 30 tablet, Rfl: 1   Cholecalciferol (VITAMIN D3) 1000 units CAPS, Take 1,000 Units by mouth daily., Disp: , Rfl:    clonazePAM (KLONOPIN) 0.5 MG tablet, Take 1 tablet (0.5 mg total) by mouth 2 (two) times daily., Disp: 60 tablet, Rfl: 0   divalproex (DEPAKOTE) 250 MG DR tablet, Take 1 tablet (250 mg total) by mouth every 12 (twelve) hours., Disp: 60 tablet, Rfl: 0   DULoxetine (CYMBALTA) 20 MG capsule, TAKE 1 CAPSULE BY MOUTH DAILY (Patient taking differently: Take 20 mg by mouth at bedtime.), Disp: 30 capsule, Rfl: 2   glimepiride (AMARYL) 4 MG tablet, Take 1 tablet (4 mg total) by mouth 2 (two) times daily., Disp: 60 tablet, Rfl: 1   hydrOXYzine (ATARAX) 50 MG tablet, TAKE 1 TABLET BY MOUTH FOUR TIMES A DAY & TAKE 2 TABLETS EVERY NIGHT AT BEDTIME FOR ANXIETY, PANIC, AGITATION, AND SLEEP (Patient taking differently: Take 100 mg by mouth in the morning and at bedtime.), Disp: 120 tablet, Rfl: 3   lisinopril (ZESTRIL) 20 MG tablet, TAKE 1 TABLET (20 MG TOTAL) BY MOUTH DAILY., Disp: 30  tablet, Rfl: 1   meloxicam (MOBIC) 7.5 MG tablet, Take 1 tablet (7.5 mg total) by mouth daily. (Patient taking differently: Take 7.5 mg by mouth daily as needed for pain.), Disp: 30 tablet, Rfl: 0   metFORMIN (GLUCOPHAGE) 500 MG tablet, Take 1 tablet (500 mg total) by mouth 2 (two) times daily. (Patient taking differently: Take 1,000 mg by mouth 2 (two) times daily.), Disp: 60 tablet, Rfl: 1   propranolol (INDERAL) 10 MG tablet, Take 1 tablet (10 mg total) by mouth 3 (three) times daily., Disp: 90 tablet, Rfl: 3   sildenafil (REVATIO) 20 MG tablet, Take 20 mg by mouth daily as needed (as directed for  E.D.)., Disp: , Rfl:    valbenazine (INGREZZA) 60 MG capsule, Take 1 capsule (60 mg total) by mouth daily., Disp: 30 capsule, Rfl: 0   omeprazole (PRILOSEC OTC) 20 MG tablet, Take 20 mg by mouth daily as needed (for reflux). (Patient not taking: Reported on 11/17/2022), Disp: , Rfl:   No Known Allergies  Objective:   BP 122/86   Pulse 65   Temp 98.2 F (36.8 C) (Oral)   Ht 5\' 10"  (1.778 m)   Wt 232 lb (105.2 kg)   SpO2 95%   BMI 33.29 kg/m      11/17/2022    2:03 PM 11/09/2022    3:55 AM 11/09/2022   12:15 AM  Vitals with BMI  Height 5\' 10"     Weight 232 lbs    BMI 33.29    Systolic 122 121 161  Diastolic 86 72 75  Pulse 65  59     Physical Exam Vitals and nursing note reviewed.  Constitutional:      Appearance: Normal appearance. He is normal weight.  HENT:     Head: Normocephalic and atraumatic.  Cardiovascular:     Rate and Rhythm: Normal rate and regular rhythm.     Pulses: Normal pulses.     Heart sounds: Normal heart sounds.  Pulmonary:     Effort: Pulmonary effort is normal.     Breath sounds: Normal breath sounds.  Skin:    General: Skin is warm and dry.     Capillary Refill: Capillary refill takes less than 2 seconds.  Neurological:     General: No focal deficit present.     Mental Status: He is alert and oriented to person, place, and time. Mental status is at baseline.     Motor: Tremor present.  Psychiatric:        Mood and Affect: Mood normal.        Behavior: Behavior normal.        Thought Content: Thought content normal.        Judgment: Judgment normal.     Assessment & Plan:  Myoclonus Assessment & Plan: Symptoms back to baseline for this patient with no new complaints. He has modified his medication regimen as instructed per his hospital discharge instructions. He reports his anxiety is stable on current regimen and he is seeing Psychiatry next week. He is in need of PA for Ingrezza to resume this. Referral placed today for Neurology. Will  also order CBC and CXR per hospitalist request at discharge. Instructed to seek medical care for new or worsening symptoms.   Orders: -     CBC with Differential/Platelet -     COMPLETE METABOLIC PANEL WITH GFR -     Magnesium -     DG Chest 2 View; Future -     Ambulatory  referral to Neurology  Hypomagnesemia Assessment & Plan: Recheck Mg and CMP today  Orders: -     COMPLETE METABOLIC PANEL WITH GFR -     Magnesium     Follow up plan: Return if symptoms worsen or fail to improve.  Park Meo, [name]

## 2022-11-18 ENCOUNTER — Other Ambulatory Visit (INDEPENDENT_AMBULATORY_CARE_PROVIDER_SITE_OTHER): Payer: No Typology Code available for payment source

## 2022-11-18 DIAGNOSIS — Z23 Encounter for immunization: Secondary | ICD-10-CM

## 2022-11-18 LAB — CBC WITH DIFFERENTIAL/PLATELET
Absolute Monocytes: 427 cells/uL (ref 200–950)
Basophils Absolute: 63 cells/uL (ref 0–200)
Basophils Relative: 0.8 %
Eosinophils Absolute: 103 cells/uL (ref 15–500)
Eosinophils Relative: 1.3 %
HCT: 48.2 % (ref 38.5–50.0)
Hemoglobin: 16 g/dL (ref 13.2–17.1)
Lymphs Abs: 2899 cells/uL (ref 850–3900)
MCH: 28.6 pg (ref 27.0–33.0)
MCHC: 33.2 g/dL (ref 32.0–36.0)
MCV: 86.2 fL (ref 80.0–100.0)
MPV: 9.7 fL (ref 7.5–12.5)
Monocytes Relative: 5.4 %
Neutro Abs: 4408 cells/uL (ref 1500–7800)
Neutrophils Relative %: 55.8 %
Platelets: 276 10*3/uL (ref 140–400)
RBC: 5.59 10*6/uL (ref 4.20–5.80)
RDW: 12.9 % (ref 11.0–15.0)
Total Lymphocyte: 36.7 %
WBC: 7.9 10*3/uL (ref 3.8–10.8)

## 2022-11-18 LAB — COMPLETE METABOLIC PANEL WITH GFR
AG Ratio: 1.9 (calc) (ref 1.0–2.5)
ALT: 41 U/L (ref 9–46)
AST: 20 U/L (ref 10–35)
Albumin: 4.4 g/dL (ref 3.6–5.1)
Alkaline phosphatase (APISO): 84 U/L (ref 35–144)
BUN: 16 mg/dL (ref 7–25)
CO2: 31 mmol/L (ref 20–32)
Calcium: 9.6 mg/dL (ref 8.6–10.3)
Chloride: 102 mmol/L (ref 98–110)
Creat: 1.02 mg/dL (ref 0.70–1.30)
Globulin: 2.3 g/dL (calc) (ref 1.9–3.7)
Glucose, Bld: 259 mg/dL — ABNORMAL HIGH (ref 65–99)
Potassium: 4.7 mmol/L (ref 3.5–5.3)
Sodium: 143 mmol/L (ref 135–146)
Total Bilirubin: 0.5 mg/dL (ref 0.2–1.2)
Total Protein: 6.7 g/dL (ref 6.1–8.1)
eGFR: 86 mL/min/{1.73_m2} (ref >=60)

## 2022-11-18 LAB — MAGNESIUM: Magnesium: 1.7 mg/dL (ref 1.5–2.5)

## 2022-11-23 ENCOUNTER — Ambulatory Visit (INDEPENDENT_AMBULATORY_CARE_PROVIDER_SITE_OTHER): Payer: Medicaid Other | Admitting: Psychiatry

## 2022-11-23 ENCOUNTER — Encounter (HOSPITAL_COMMUNITY): Payer: Self-pay | Admitting: Psychiatry

## 2022-11-23 VITALS — BP 107/69 | HR 71 | Temp 98.7°F | Wt 231.2 lb

## 2022-11-23 DIAGNOSIS — G2401 Drug induced subacute dyskinesia: Secondary | ICD-10-CM | POA: Diagnosis not present

## 2022-11-23 DIAGNOSIS — F411 Generalized anxiety disorder: Secondary | ICD-10-CM

## 2022-11-23 DIAGNOSIS — F3130 Bipolar disorder, current episode depressed, mild or moderate severity, unspecified: Secondary | ICD-10-CM | POA: Diagnosis not present

## 2022-11-23 MED ORDER — DULOXETINE HCL 40 MG PO CPEP
40.0000 mg | ORAL_CAPSULE | Freq: Every day | ORAL | 3 refills | Status: DC
Start: 2022-11-23 — End: 2023-03-10

## 2022-11-23 MED ORDER — DIVALPROEX SODIUM 500 MG PO DR TAB
500.0000 mg | DELAYED_RELEASE_TABLET | Freq: Two times a day (BID) | ORAL | 3 refills | Status: DC
Start: 2022-11-23 — End: 2023-03-10

## 2022-11-23 MED ORDER — PROPRANOLOL HCL 10 MG PO TABS
10.0000 mg | ORAL_TABLET | Freq: Three times a day (TID) | ORAL | 3 refills | Status: DC
Start: 2022-11-23 — End: 2023-03-10

## 2022-11-23 MED ORDER — VALBENAZINE TOSYLATE 60 MG PO CAPS
60.0000 mg | ORAL_CAPSULE | Freq: Every day | ORAL | 0 refills | Status: DC
Start: 2022-11-23 — End: 2022-11-23

## 2022-11-23 MED ORDER — HYDROXYZINE HCL 50 MG PO TABS
ORAL_TABLET | ORAL | 3 refills | Status: DC
Start: 2022-11-23 — End: 2023-03-10

## 2022-11-23 MED ORDER — VALBENAZINE TOSYLATE 60 MG PO CAPS
60.0000 mg | ORAL_CAPSULE | Freq: Every day | ORAL | 3 refills | Status: DC
Start: 2022-11-23 — End: 2022-12-09

## 2022-11-23 NOTE — Progress Notes (Signed)
BH MD/PA/NP OP Progress Note         11/23/2022 3:42 PM [name] [name]  MRN:  [id number]  Chief Complaint: "'I am not doing so well".   HPI:  57 year old male seen today for follow up psychiatric evaluation. He has a history of anxiety, depression and PTSD.  Patient was recently hospitalized on 11/05/2022 through 11/09/2002 where he presented for worsening of muscle jerking and unable to ambulate.  Per chart review patient may have developed TD from the increase of Abilify.  His medications were adjusted and he is currently managed on Cymbalta 20 mg daily, Depakote 250 mg twice daily propranolol 10 mg 3 times daily, Ingrezza 60 mg daily, and hydroxyzine 50 mg 4 times daily as needed.  Patient informed Clinical research associate that his Xanax was discontinued however reports that he might try to have his PCP restarted.  He reports his medications are somewhat effective in managing his psychiatric conditions.  Today he was well groomed, pleasant, cooperative, engaged in conversation, and maintained eye contact. Patient notes that he is not doing well.  He informed Clinical research associate that he continues to suffer from symptoms of hypomania such as distractibility, irritability, fluctuations in mood, and racing thoughts.  Patient informed writer that at times he becomes irritable and has snapped all people in stores.  He notes that when he worked he would get into an altercation with his peers.  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  Patient informed Clinical research associate that he has been unable to maintain a job due to his mental health and intense irritability/fluctuations in mood.  He asked Clinical research associate to fill out his disability forms.  Provider was agreeable to this.  Forms were filled out, faxed over to the appropriate representatives, and a copy was given to the patient.   Today patient notes that his anxiety and depression continues to be problematic.  He notes that he worries about being  approved for disability, finances, and housing.  Patient currently resides with his neighbor as his brother sold the home he was living in.  Today provider conducted a GAD-7 and patient scored a 21, at his last visit he scored a 19.  Provider also conducted PHQ-9 and patient scored 24, at his last visit he scored an 18.  He endorses fluctuations in sleep and appetite.  Since his last visit he notes that he has lost 3 pounds.  Today he denies SI/HI/AVH or paranoia.     Patient reports that he continues to have back and shoulder pain.  Provider referred patient to primary care at his last visit.  He notes that he has an upcoming appointment in a few weeks.   Today provider conducted an aims assessment and patient scored a 4.   Today he is agreeable to increasing Depakote 250 mg twice daily to 500 mg twice daily to help manage mood.  He is also agreeable to increasing Cymbalta 20 mg to 40 mg to help manage anxiety and depression.  Patient informed Clinical research associate that he could not afford Ingrezza.  Provider gave patient a 2-week sample of Ingrezza and instructed nursing staff to fill out prior authorization when it arrives.  Potential side effects of medication and risks vs benefits of treatment vs non-treatment were explained and discussed. All questions were answered. No other concerns at this time.   ICD-10-CM   1. Tardive dyskinesia  G24.01 valbenazine (INGREZZA) 60 MG capsule  2. Generalized anxiety disorder  F41.1 propranolol (INDERAL) 10 MG tablet    DULoxetine 40 MG CPEP    hydrOXYzine (ATARAX) 50 MG tablet    3. Bipolar I disorder, most recent episode depressed (HCC)  F31.30 divalproex (DEPAKOTE) 500 MG DR tablet    DULoxetine 40 MG CPEP            Past Psychiatric History: anxiety, depression and PTSD  Past Medical History:  Past Medical History:  Diagnosis Date   Anxiety    Chest heaviness    Chills    Depression, major    Diabetes mellitus without complication (HCC)    Dizzy     Dyslipidemia    ED (erectile dysfunction)    Elevated CPK    HA (headache)    Hypertension    Hypertriglyceridemia    Insomnia    Kidney stone    Left-sided Bell's palsy    Morbid obesity (HCC)    Nausea    No appetite    Panniculitis    of right lower leg   Parkinson's disease    PTSD (post-traumatic stress disorder)    Weakness     Past Surgical History:  Procedure Laterality Date   NO PAST SURGERIES      Family Psychiatric History: Sister schizophrenia and bipolar disorder  Family History:  Family History  Problem Relation Age of Onset   Thyroid disease Brother    Depression Brother    Heart disease Mother    Kidney failure Father    Diabetes Father    Thyroid disease Sister    Mental illness Sister    Heart attack Maternal Grandfather    Heart failure Paternal Grandmother    Cancer Paternal Grandfather        bone    Social History:  Social History   Socioeconomic History   Marital status: Single    Spouse name: Not on file   Number of children: 0   Years of education: 12   Highest education level: Not on file  Occupational History   Not on file  Tobacco Use   Smoking status: Never   Smokeless tobacco: Never  Vaping Use   Vaping status: Never Used  Substance and Sexual Activity   Alcohol use: No   Drug use: No   Sexual activity: Not on file  Other Topics Concern   Not on file  Social History Narrative   Lives with brother in a one story home.  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    Social Determinants of Health   Financial Resource Strain: Not on file  Food Insecurity: No Food Insecurity (11/06/2022)   Hunger Vital Sign    Worried About Running Out of Food in the Last Year: Never true    Ran Out of Food in the Last Year: Never true  Transportation Needs: No Transportation Needs (11/06/2022)   PRAPARE - Administrator, Civil Service (Medical): No    Lack of Transportation (Non-Medical): No  Physical  Activity: Not on file  Stress: Not on file  Social Connections: Unknown (09/22/2021)   [facility]   Social Network    Social Network: Not on file    Allergies: No Known Allergies  Metabolic Disorder Labs: Lab Results  Component Value Date   HGBA1C 6.3 08/31/2022   No results found for: "PROLACTIN" Lab Results  Component Value Date   CHOL 161 09/01/2022   TRIG 160 (H) 09/01/2022  HDL 46 09/01/2022   CHOLHDL 3.5 09/01/2022   LDLCALC 87 09/01/2022   LDLCALC 84 07/23/2021   Lab Results  Component Value Date   TSH 0.995 11/07/2022   TSH 2.900 09/01/2022    Therapeutic Level Labs: No results found for: "LITHIUM" No results found for: "VALPROATE" No results found for: "CBMZ"  Current Medications: Current Outpatient Medications  Medication Sig Dispense Refill   aspirin EC 81 MG tablet Take 81 mg by mouth daily.     atorvastatin (LIPITOR) 20 MG tablet Take 1 tablet (20 mg total) by mouth daily. 30 tablet 1   Cholecalciferol (VITAMIN D3) 1000 units CAPS Take 1,000 Units by mouth daily.     clonazePAM (KLONOPIN) 0.5 MG tablet Take 1 tablet (0.5 mg total) by mouth 2 (two) times daily. 60 tablet 0   divalproex (DEPAKOTE) 500 MG DR tablet Take 1 tablet (500 mg total) by mouth 2 (two) times daily. 60 tablet 3   DULoxetine 40 MG CPEP Take 1 capsule (40 mg total) by mouth daily. 30 capsule 3   glimepiride (AMARYL) 4 MG tablet Take 1 tablet (4 mg total) by mouth 2 (two) times daily. 60 tablet 1   hydrOXYzine (ATARAX) 50 MG tablet TAKE 1 TABLET BY MOUTH FOUR TIMES A DAY & TAKE 2 TABLETS EVERY NIGHT AT BEDTIME FOR ANXIETY, PANIC, AGITATION, AND SLEEP 120 tablet 3   lisinopril (ZESTRIL) 20 MG tablet TAKE 1 TABLET (20 MG TOTAL) BY MOUTH DAILY. 30 tablet 1   meloxicam (MOBIC) 7.5 MG tablet Take 1 tablet (7.5 mg total) by mouth daily. (Patient taking differently: Take 7.5 mg by mouth daily as needed for pain.) 30 tablet 0   metFORMIN (GLUCOPHAGE) 500 MG tablet Take 1 tablet  (500 mg total) by mouth 2 (two) times daily. (Patient taking differently: Take 1,000 mg by mouth 2 (two) times daily.) 60 tablet 1   omeprazole (PRILOSEC OTC) 20 MG tablet Take 20 mg by mouth daily as needed (for reflux). (Patient not taking: Reported on 11/17/2022)     propranolol (INDERAL) 10 MG tablet Take 1 tablet (10 mg total) by mouth 3 (three) times daily. 90 tablet 3   sildenafil (REVATIO) 20 MG tablet Take 20 mg by mouth daily as needed (as directed for E.D.).     valbenazine (INGREZZA) 60 MG capsule Take 1 capsule (60 mg total) by mouth daily. 30 capsule 0   No current facility-administered medications for this visit.     Musculoskeletal: Strength & Muscle Tone: within normal limits and telehealth visit Gait & Station: normal, telehealth visit Patient leans: N/A  Psychiatric Specialty Exam: Review of Systems  There were no vitals taken for this visit.There is no height or weight on file to calculate BMI.  General Appearance: Well Groomed  Eye Contact:  Good  Speech:  Clear and Coherent and Normal Rate  Volume:  Normal  Mood:  Anxious and Depressed  Affect:  Congruent  Thought Process:  Coherent, Goal Directed and Linear  Orientation:  Full (Time, Place, and Person)  Thought Content: WDL and Logical   Suicidal Thoughts:  No  Homicidal Thoughts:  No  Memory:  Immediate;   Good Recent;   Good Remote;   Good  Judgement:  Good  Insight:  Good  Psychomotor Activity:  Normal  Concentration:  Concentration: Good and Attention Span: Good  Recall:  Good  Fund of Knowledge: Good  Language: Good  Akathisia:  No  Handed:  Right  AIMS (if indicated): done, 4  Assets:  Communication Skills Desire for Improvement Financial Resources/Insurance Housing Social Support  ADL's:  Intact  Cognition: WNL  Sleep:  Fair   Screenings: AIMS    Flowsheet Row Clinical Support from 11/23/2022 in [facility] ED to Hosp-Admission (Discharged) from 11/05/2022  in [facility]  AIMS Total Score 4 23      GAD-7    Flowsheet Row Clinical Support from 11/23/2022 in [facility] Office Visit from 10/13/2022 in [facility] Office Visit from 09/28/2022 in [facility] Clinical Support from 09/01/2022 in [facility] Office Visit from 08/31/2022 in [facility]  Total GAD-7 Score 21 21 20 21 21       PHQ2-9    Flowsheet Row Clinical Support from 11/23/2022 in [facility] Office Visit from 10/13/2022 in [facility] Office Visit from 09/28/2022 in [facility] Clinical Support from 09/01/2022 in [facility] Office Visit from 08/31/2022 in [facility]  PHQ-2 Total Score 6 6 6 6 6   PHQ-9 Total Score 24 23 22 27 24       Flowsheet Row Clinical Support from 11/23/2022 in [facility] ED to Hosp-Admission (Discharged) from 11/05/2022 in [facility] Video Visit from 10/21/2021 in [facility]  C-SSRS RISK CATEGORY Error: Q7 should not be populated when Q6 is No No Risk Error: Q7 should not be populated when Q6 is No        Assessment and Plan: Patient endorses symptoms of anxiety, depression, hypomania. Recently he was hospitalized for potential TD and his medications were adjusted. Today he is agreeable to increasing Depakote 250 mg twice daily to 500 mg twice daily to help manage mood.  He is also agreeable to increasing Cymbalta 20 mg to 40 mg to help manage anxiety and depression.  Patient informed Clinical research associate that he could not afford Ingrezza.  Provider gave patient a 2-week sample of Ingrezza and instructed nursing staff to fill out prior authorization when it arrives  1. Generalized anxiety disorder  Continue- propranolol (INDERAL) 10 MG tablet; Take 1 tablet (10 mg total) by mouth 3 (three) times daily.   Dispense: 90 tablet; Refill: 3 Increased- DULoxetine 40 MG CPEP; Take 1 capsule (40 mg total) by mouth daily.  Dispense: 30 capsule; Refill: 3 Continue- hydrOXYzine (ATARAX) 50 MG tablet; TAKE 1 TABLET BY MOUTH FOUR TIMES A DAY & TAKE 2 TABLETS EVERY NIGHT AT BEDTIME FOR ANXIETY, PANIC, AGITATION, AND SLEEP  Dispense: 120 tablet; Refill: 3  2. Bipolar I disorder, most recent episode depressed (HCC)  Increased- divalproex (DEPAKOTE) 500 MG DR tablet; Take 1 tablet (500 mg total) by mouth 2 (two) times daily.  Dispense: 60 tablet; Refill: 3 Increased- DULoxetine 40 MG CPEP; Take 1 capsule (40 mg total) by mouth daily.  Dispense: 30 capsule; Refill: 3  3. Tardive dyskinesia  Continue- valbenazine (INGREZZA) 60 MG capsule; Take 1 capsule (60 mg total) by mouth daily.  Dispense: 30 capsule; Refill: 3  Follow-up in 3 months

## 2022-11-24 ENCOUNTER — Ambulatory Visit (INDEPENDENT_AMBULATORY_CARE_PROVIDER_SITE_OTHER): Payer: No Typology Code available for payment source

## 2022-11-24 VITALS — Ht 70.0 in | Wt 231.0 lb

## 2022-11-24 DIAGNOSIS — E1165 Type 2 diabetes mellitus with hyperglycemia: Secondary | ICD-10-CM

## 2022-11-24 DIAGNOSIS — Z7984 Long term (current) use of oral hypoglycemic drugs: Secondary | ICD-10-CM | POA: Diagnosis not present

## 2022-11-24 LAB — HM DIABETES EYE EXAM

## 2022-11-24 NOTE — Progress Notes (Addendum)
Pt here for diabetic retinopathy exam. Procedure explained to patient and pt verbalized understanding. Images obtained and pt tolerated procedure well. MJP,LPN Image Id's: [id number]  ELOHIM VERMETTE arrived 11/17/2022 and has given verbal consent to obtain images and complete their overdue diabetic retinal screening.  The images have been sent to an ophthalmologist or optometrist for review and interpretation.  Results will be sent back to Park Meo, [name] for review.  Patient has been informed they will be contacted when we receive the results via telephone or MyChart

## 2022-12-01 ENCOUNTER — Telehealth: Payer: Self-pay

## 2022-12-01 NOTE — Telephone Encounter (Signed)
Pt came in to drop off disability forms to be completed by pcp. Pt has to pay the fee for form completion. Pt will be contacted once form is completed. Intake form and disability form placed in nurse's folder. Please advise.  Cb#: [phone]

## 2022-12-07 ENCOUNTER — Ambulatory Visit (HOSPITAL_BASED_OUTPATIENT_CLINIC_OR_DEPARTMENT_OTHER): Payer: Medicaid Other | Admitting: Family Medicine

## 2022-12-07 ENCOUNTER — Encounter: Payer: Self-pay | Admitting: Family Medicine

## 2022-12-07 NOTE — Telephone Encounter (Signed)
Disability forms completed per [name], give to front desk for pt to pick up. Send to msg via pt's MyChart to let him know forms are ready for pick up.

## 2022-12-08 ENCOUNTER — Encounter: Payer: Self-pay | Admitting: Family Medicine

## 2022-12-08 ENCOUNTER — Telehealth: Payer: Self-pay

## 2022-12-08 ENCOUNTER — Ambulatory Visit (INDEPENDENT_AMBULATORY_CARE_PROVIDER_SITE_OTHER): Payer: Medicaid Other | Admitting: Family Medicine

## 2022-12-08 VITALS — BP 120/80 | HR 67 | Temp 97.8°F | Ht 70.0 in | Wt 240.0 lb

## 2022-12-08 DIAGNOSIS — R251 Tremor, unspecified: Secondary | ICD-10-CM

## 2022-12-08 MED ORDER — CLONAZEPAM 1 MG PO TABS: 1.0000 mg | ORAL_TABLET | Freq: Two times a day (BID) | ORAL | 0 refills | Status: DC

## 2022-12-08 NOTE — Assessment & Plan Note (Signed)
Patient is here today with worsening tremor. 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 He was started on Ingrezza but is unable to afford this. I will increase his Clonazepam to 1mg  BID and encouraged him to reach out to psychiatry for further recommendations. He also has a pending referral to Neurology for his parkinsons. Encouraged to return to office if symptoms persist or worsen and he is unable to get in with Psychiatry for medication adjustment.

## 2022-12-08 NOTE — Telephone Encounter (Signed)
Send msg to pt via his MyChart

## 2022-12-08 NOTE — Progress Notes (Addendum)
Subjective:  HPI: [name] [name] is a 58 y.o. male presenting on 12/08/2022 for Acute Visit (shakes worsening - JBG\\\)   HPI Patient is in today for worsening tremors. He was seen in the ED last month with Myoclonus and discharged on Ingrezza, Depakote, and Clonazepam. He was unable to obtain the Ingrezza that was prescribed due to cost and no insurance coverage. He is taking Clonazepam 0.5mg  BID. A neurology referral was also placed and he has been unable to schedule this. He has not reached out to his psychiatrist about this. No new symptoms.  Review of Systems  All other systems reviewed and are negative.   Relevant past medical history reviewed and updated as indicated.   Past Medical History:  Diagnosis Date   Anxiety    Arthritis    Chest heaviness    Chills    Depression, major    Diabetes mellitus without complication (HCC)    Dizzy    Dyslipidemia    ED (erectile dysfunction)    Elevated CPK    GERD (gastroesophageal reflux disease)    HA (headache)    Hypertension    Hypertriglyceridemia    Insomnia    Kidney stone    Left-sided Bell's palsy    Morbid obesity (HCC)    Nausea    No appetite    Panniculitis    of right lower leg   Parkinson's disease    PTSD (post-traumatic stress disorder)    Weakness      Past Surgical History:  Procedure Laterality Date   NO PAST SURGERIES      Allergies and medications reviewed and updated.   Current Outpatient Medications:    aspirin EC 81 MG tablet, Take 81 mg by mouth daily., Disp: , Rfl:    atorvastatin (LIPITOR) 20 MG tablet, Take 1 tablet (20 mg total) by mouth daily., Disp: 30 tablet, Rfl: 1   Cholecalciferol (VITAMIN D3) 1000 units CAPS, Take 1,000 Units by mouth daily., Disp: , Rfl:    divalproex (DEPAKOTE) 500 MG DR tablet, Take 1 tablet (500 mg total) by mouth 2 (two) times daily., Disp: 60 tablet, Rfl: 3   DULoxetine 40 MG CPEP, Take 1 capsule (40 mg total) by mouth daily., Disp: 30 capsule, Rfl:  3   glimepiride (AMARYL) 4 MG tablet, Take 1 tablet (4 mg total) by mouth 2 (two) times daily., Disp: 60 tablet, Rfl: 1   hydrOXYzine (ATARAX) 50 MG tablet, TAKE 1 TABLET BY MOUTH FOUR TIMES A DAY & TAKE 2 TABLETS EVERY NIGHT AT BEDTIME FOR ANXIETY, PANIC, AGITATION, AND SLEEP, Disp: 120 tablet, Rfl: 3   lisinopril (ZESTRIL) 20 MG tablet, TAKE 1 TABLET (20 MG TOTAL) BY MOUTH DAILY., Disp: 30 tablet, Rfl: 1   metFORMIN (GLUCOPHAGE) 500 MG tablet, Take 1 tablet (500 mg total) by mouth 2 (two) times daily. (Patient taking differently: Take 1,000 mg by mouth 2 (two) times daily.), Disp: 60 tablet, Rfl: 1   propranolol (INDERAL) 10 MG tablet, Take 1 tablet (10 mg total) by mouth 3 (three) times daily., Disp: 90 tablet, Rfl: 3   sildenafil (REVATIO) 20 MG tablet, Take 20 mg by mouth daily as needed (as directed for E.D.)., Disp: , Rfl:    clonazePAM (KLONOPIN) 1 MG tablet, Take 1 tablet (1 mg total) by mouth 2 (two) times daily., Disp: 60 tablet, Rfl: 0   meloxicam (MOBIC) 7.5 MG tablet, Take 1 tablet (7.5 mg total) by mouth daily. (Patient not taking: Reported on 12/08/2022), Disp:  30 tablet, Rfl: 0   omeprazole (PRILOSEC OTC) 20 MG tablet, Take 20 mg by mouth daily as needed (for reflux). (Patient not taking: Reported on 12/08/2022), Disp: , Rfl:    valbenazine (INGREZZA) 60 MG capsule, Take 1 capsule (60 mg total) by mouth daily., Disp: 30 capsule, Rfl: 3  No Known Allergies  Objective:   BP 120/80   Pulse 67   Temp 97.8 F (36.6 C) (Oral)   Ht 5\' 10"  (1.778 m)   Wt 240 lb (108.9 kg)   SpO2 99%   BMI 34.44 kg/m      12/08/2022    3:41 PM 11/24/2022    9:02 AM 11/23/2022    1:50 PM  Vitals with BMI  Height 5\' 10"  5\' 10"    Weight 240 lbs 231 lbs   BMI 34.44 33.15   Systolic 120    Diastolic 80    Pulse 67       Information is confidential and restricted. Go to Review Flowsheets to unlock data.     Physical Exam Vitals and nursing note reviewed.  Constitutional:      Appearance:  Normal appearance. He is normal weight.  HENT:     Head: Normocephalic and atraumatic.  Skin:    General: Skin is warm and dry.     Capillary Refill: Capillary refill takes less than 2 seconds.  Neurological:     General: No focal deficit present.     Mental Status: He is alert and oriented to person, place, and time. Mental status is at baseline.     Motor: Tremor present.  Psychiatric:        Mood and Affect: Mood normal.        Behavior: Behavior normal.        Thought Content: Thought content normal.        Judgment: Judgment normal.     Assessment & Plan:  Tremor Assessment & Plan: Patient is here today with worsening tremor. 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 He was started on Ingrezza but is unable to afford this. I will increase his Clonazepam to 1mg  BID and encouraged him to reach out to psychiatry for further recommendations. He also has a pending referral to Neurology for his parkinsons. Encouraged to return to office if symptoms persist or worsen and he is unable to get in with Psychiatry for medication adjustment.   Other orders -     clonazePAM; Take 1 tablet (1 mg total) by mouth 2 (two) times daily.  Dispense: 60 tablet; Refill: 0     Follow up plan: No follow-ups on file.  Park Meo, [name]

## 2022-12-09 ENCOUNTER — Ambulatory Visit (INDEPENDENT_AMBULATORY_CARE_PROVIDER_SITE_OTHER): Payer: No Typology Code available for payment source | Admitting: Psychiatry

## 2022-12-09 ENCOUNTER — Telehealth (HOSPITAL_COMMUNITY): Payer: Self-pay | Admitting: *Deleted

## 2022-12-09 ENCOUNTER — Encounter (HOSPITAL_COMMUNITY): Payer: Self-pay | Admitting: Psychiatry

## 2022-12-09 ENCOUNTER — Telehealth (HOSPITAL_COMMUNITY): Payer: Self-pay | Admitting: Psychiatry

## 2022-12-09 DIAGNOSIS — G2401 Drug induced subacute dyskinesia: Secondary | ICD-10-CM | POA: Diagnosis not present

## 2022-12-09 MED ORDER — VALBENAZINE TOSYLATE 60 MG PO CAPS
60.0000 mg | ORAL_CAPSULE | Freq: Every day | ORAL | 3 refills | Status: DC
Start: 2022-12-09 — End: 2023-03-10

## 2022-12-09 NOTE — Telephone Encounter (Signed)
Entry error to prior note. See updates below:  Provider saw patient this morning and he informed writer that since running out of Ingrezza his TD is worsening. Provider conducted an Aim assessment and patient scored a 21. He notes that while on Ingrezza his movements drastically improved. He continues to be anxious and depressed. Patient informed writer that he has not picked up his new prescription of Cymbalta or Depakote. He does note that his Klonopin was increased by his PCP Ms. Kurtis Bushman [name]. Provider spoke to his PCP about above. Provider gave patient a 2-week sample of Ingrezza. Nursing staff as well as [facility] has begun to work on his prior authorization for the medication.   Marland Kitchen

## 2022-12-09 NOTE — Progress Notes (Signed)
BH MD/PA/NP OP Progress Note         12/09/2022 9:35 AM KAZIMER GRESSER  MRN:  [id number]  Chief Complaint: "I ran out of Ingrezza".   HPI:  57 year old male seen today for follow up psychiatric evaluation. He has a history of anxiety, depression and PTSD.  He has a psychiatric history of depression, PTSD, anxiety, and bipolar disorder.  He is currently managed on Cymbalta 40 mg daily, Depakote 500 mg twice daily propranolol 10 mg 3 times daily, Ingrezza 60 mg daily, Clonazepam 1 mg BID (prescribed by PCP), and hydroxyzine 50 mg 4 times daily as needed.  He reports he ran out of Ingrezza and now is in need of refill as his TD is worsening.    Today he was well groomed, pleasant, cooperative, engaged in conversation, and maintained eye contact. Patient notes that he is not doing well.  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 Patient notes that his movements are worsening his anxiety and depression. [facility] PCP recently increased his Klonopin. He notes that he has not yet picked up his new prescriptions of Depakote and Cymbalta. Today provider conducted and AIMS assessment and patient scored a 21, while on Ingrezza 60 mg patient scored a 4 at his last visit.  He endorses fluctuations in sleep and appetite.  Since his last visit he notes that he has lost 2 pounds per week.  Today he denies SI/HI/AVH or paranoia.     Today provider gave patient a 2-week sample of Ingrezza and instructed nursing staff to fill out prior authorization when it arrives.  He will continue all other medications as prescribed.  No other concerns at this time.   ICD-10-CM   1. Tardive dyskinesia  G24.01 valbenazine (INGREZZA) 60 MG capsule             Past Psychiatric History: anxiety, depression and PTSD  Past Medical History:  Past Medical History:  Diagnosis Date   Anxiety    Arthritis    Chest heaviness    Chills    Depression, major    Diabetes  mellitus without complication (HCC)    Dizzy    Dyslipidemia    ED (erectile dysfunction)    Elevated CPK    GERD (gastroesophageal reflux disease)    HA (headache)    Hypertension    Hypertriglyceridemia    Insomnia    Kidney stone    Left-sided Bell's palsy    Morbid obesity (HCC)    Nausea    No appetite    Panniculitis    of right lower leg   Parkinson's disease    PTSD (post-traumatic stress disorder)    Weakness     Past Surgical History:  Procedure Laterality Date   NO PAST SURGERIES      Family Psychiatric History: Sister schizophrenia and bipolar disorder  Family History:  Family History  Problem Relation Age of Onset   Thyroid disease Brother    Depression Brother    Heart disease Mother    Anxiety disorder Mother    Kidney failure Father    Diabetes Father    Anxiety disorder Father    Thyroid disease Sister    Mental illness Sister    Heart attack Maternal Grandfather    Heart failure Paternal Grandmother    Cancer Paternal Grandfather        bone    Social History:  Social History   Socioeconomic  History   Marital status: Single    Spouse name: Not on file   Number of children: 0   Years of education: 41   Highest education level: Not on file  Occupational History   Not on file  Tobacco Use   Smoking status: Never   Smokeless tobacco: Never  Vaping Use   Vaping status: Never Used  Substance and Sexual Activity   Alcohol use: No   Drug use: No   Sexual activity: Yes  Other Topics Concern   Not on file  Social History Narrative   Lives with brother in a one story home.  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    Social Determinants of Health   Financial Resource Strain: Not on file  Food Insecurity: No Food Insecurity (11/06/2022)   Hunger Vital Sign    Worried About Running Out of Food in the Last Year: Never true    Ran Out of Food in the Last Year: Never true  Transportation Needs: No Transportation Needs  (11/06/2022)   PRAPARE - Administrator, Civil Service (Medical): No    Lack of Transportation (Non-Medical): No  Physical Activity: Not on file  Stress: Not on file  Social Connections: Unknown (09/22/2021)   Received from [facility]   Social Network    Social Network: Not on file    Allergies: No Known Allergies  Metabolic Disorder Labs: Lab Results  Component Value Date   HGBA1C 6.3 08/31/2022   No results found for: "PROLACTIN" Lab Results  Component Value Date   CHOL 161 09/01/2022   TRIG 160 (H) 09/01/2022   HDL 46 09/01/2022   CHOLHDL 3.5 09/01/2022   LDLCALC 87 09/01/2022   LDLCALC 84 07/23/2021   Lab Results  Component Value Date   TSH 0.995 11/07/2022   TSH 2.900 09/01/2022    Therapeutic Level Labs: No results found for: "LITHIUM" No results found for: "VALPROATE" No results found for: "CBMZ"  Current Medications: Current Outpatient Medications  Medication Sig Dispense Refill   aspirin EC 81 MG tablet Take 81 mg by mouth daily.     atorvastatin (LIPITOR) 20 MG tablet Take 1 tablet (20 mg total) by mouth daily. 30 tablet 1   Cholecalciferol (VITAMIN D3) 1000 units CAPS Take 1,000 Units by mouth daily.     clonazePAM (KLONOPIN) 1 MG tablet Take 1 tablet (1 mg total) by mouth 2 (two) times daily. 60 tablet 0   divalproex (DEPAKOTE) 500 MG DR tablet Take 1 tablet (500 mg total) by mouth 2 (two) times daily. 60 tablet 3   DULoxetine 40 MG CPEP Take 1 capsule (40 mg total) by mouth daily. 30 capsule 3   glimepiride (AMARYL) 4 MG tablet Take 1 tablet (4 mg total) by mouth 2 (two) times daily. 60 tablet 1   hydrOXYzine (ATARAX) 50 MG tablet TAKE 1 TABLET BY MOUTH FOUR TIMES A DAY & TAKE 2 TABLETS EVERY NIGHT AT BEDTIME FOR ANXIETY, PANIC, AGITATION, AND SLEEP 120 tablet 3   lisinopril (ZESTRIL) 20 MG tablet TAKE 1 TABLET (20 MG TOTAL) BY MOUTH DAILY. 30 tablet 1   meloxicam (MOBIC) 7.5 MG tablet Take 1 tablet (7.5 mg total) by mouth daily. (Patient  not taking: Reported on 12/08/2022) 30 tablet 0   metFORMIN (GLUCOPHAGE) 500 MG tablet Take 1 tablet (500 mg total) by mouth 2 (two) times daily. (Patient taking differently: Take 1,000 mg by mouth 2 (two) times daily.) 60 tablet 1  omeprazole (PRILOSEC OTC) 20 MG tablet Take 20 mg by mouth daily as needed (for reflux). (Patient not taking: Reported on 12/08/2022)     propranolol (INDERAL) 10 MG tablet Take 1 tablet (10 mg total) by mouth 3 (three) times daily. 90 tablet 3   sildenafil (REVATIO) 20 MG tablet Take 20 mg by mouth daily as needed (as directed for E.D.).     valbenazine (INGREZZA) 60 MG capsule Take 1 capsule (60 mg total) by mouth daily. 30 capsule 3   No current facility-administered medications for this visit.     Musculoskeletal: Strength & Muscle Tone: within normal limits Gait & Station: normal Patient leans: N/A  Psychiatric Specialty Exam: Review of Systems  There were no vitals taken for this visit.There is no height or weight on file to calculate BMI.  General Appearance: Well Groomed  Eye Contact:  Good  Speech:  Clear and Coherent and Normal Rate  Volume:  Normal  Mood:  Anxious and Depressed  Affect:  Congruent  Thought Process:  Coherent, Goal Directed and Linear  Orientation:  Full (Time, Place, and Person)  Thought Content: WDL and Logical   Suicidal Thoughts:  No  Homicidal Thoughts:  No  Memory:  Immediate;   Good Recent;   Good Remote;   Good  Judgement:  Good  Insight:  Good  Psychomotor Activity:  Normal  Concentration:  Concentration: Good and Attention Span: Good  Recall:  Good  Fund of Knowledge: Good  Language: Good  Akathisia:  No  Handed:  Right  AIMS (if indicated): done, 21  Assets:  Communication Skills Desire for Improvement Financial Resources/Insurance Housing Social Support  ADL's:  Intact  Cognition: WNL  Sleep:  Fair   Screenings: AIMS    Flowsheet Row Clinical Support from 12/09/2022 in [facility] Clinical Support from 11/23/2022 in [facility] ED to Hosp-Admission (Discharged) from 11/05/2022 in [facility]  AIMS Total Score 21 4 23       GAD-7    Flowsheet Row Office Visit from 12/08/2022 in [facility] Clinical Support from 11/23/2022 in [facility] Office Visit from 10/13/2022 in [facility] Office Visit from 09/28/2022 in [facility] Clinical Support from 09/01/2022 in [place] [facility]  Total GAD-7 Score 21 21 21 20 21       PHQ2-9    Flowsheet Row Office Visit from 12/08/2022 in [facility] Clinical Support from 11/23/2022 in [facility] Office Visit from 10/13/2022 in [facility] Office Visit from 09/28/2022 in [facility] Clinical Support from 09/01/2022 in [facility]  PHQ-2 Total Score 6 6 6 6 6   PHQ-9 Total Score 26 24 23 22 27       Flowsheet Row Clinical Support from 11/23/2022 in [facility] ED to Hosp-Admission (Discharged) from 11/05/2022 in [place] [facility] Video Visit from 10/21/2021 in [facility]  C-SSRS RISK CATEGORY Error: Q7 should not be populated when Q6 is No No Risk Error: Q7 should not be populated when Q6 is No        Assessment and Plan: Patient endorses symptoms of anxiety, depression, TD. Today provider gave patient a 2-week sample of Ingrezza and instructed nursing staff to fill out prior authorization when it arrives.  He will  continue all other medications as prescribed.   1. Tardive dyskinesia  Continue- valbenazine (INGREZZA) 60 MG capsule; Take 1 capsule (60 mg total) by mouth daily.  Dispense: 30 capsule; Refill: 3   Follow-up in 3 months

## 2022-12-09 NOTE — Telephone Encounter (Signed)
Spoke with [facility] about prior authorization of [employer]. Submitted online with cover my meds, awaiting decision.

## 2022-12-15 NOTE — Progress Notes (Signed)
Erroneous encounter

## 2022-12-22 DIAGNOSIS — E119 Type 2 diabetes mellitus without complications: Secondary | ICD-10-CM | POA: Insufficient documentation

## 2022-12-23 ENCOUNTER — Telehealth: Payer: Self-pay | Admitting: Family Medicine

## 2022-12-23 NOTE — Telephone Encounter (Signed)
Patient called to follow up on neurology referral received from NP. Referral note states Dr. Arbutus Leas, neurologist, needs notes with parkinson's disease dx to proceed with scheduling.   Patient stated he's up against a deadline for his attorney; neurology appointment needed asap.  Please advise patient when ready to schedule at [phone].

## 2022-12-23 NOTE — Telephone Encounter (Signed)
Patient called back to notify us he was transported by ambulance and seen as an inpatient at [facility] for the Parkinson's Disease earlier this year. Please see previous message in tread.

## 2022-12-24 ENCOUNTER — Telehealth (HOSPITAL_COMMUNITY): Payer: Self-pay | Admitting: Psychiatry

## 2022-12-24 NOTE — Telephone Encounter (Signed)
Patient's medication was sent to [facility].  He was informed that his co-pay is $4.  Patient notes that he will pick up his prescription.

## 2022-12-31 ENCOUNTER — Encounter: Payer: Self-pay | Admitting: Family Medicine

## 2023-01-05 ENCOUNTER — Ambulatory Visit (INDEPENDENT_AMBULATORY_CARE_PROVIDER_SITE_OTHER): Payer: No Typology Code available for payment source | Admitting: Family Medicine

## 2023-01-05 ENCOUNTER — Encounter: Payer: Self-pay | Admitting: Family Medicine

## 2023-01-05 VITALS — BP 100/64 | HR 60 | Temp 98.1°F | Ht 70.0 in | Wt 237.0 lb

## 2023-01-05 DIAGNOSIS — R822 Biliuria: Secondary | ICD-10-CM

## 2023-01-05 DIAGNOSIS — Z113 Encounter for screening for infections with a predominantly sexual mode of transmission: Secondary | ICD-10-CM | POA: Diagnosis not present

## 2023-01-05 DIAGNOSIS — R300 Dysuria: Secondary | ICD-10-CM | POA: Insufficient documentation

## 2023-01-05 DIAGNOSIS — N3000 Acute cystitis without hematuria: Secondary | ICD-10-CM | POA: Diagnosis not present

## 2023-01-05 HISTORY — DX: Encounter for screening for infections with a predominantly sexual mode of transmission: Z11.3

## 2023-01-05 LAB — URINALYSIS, ROUTINE W REFLEX MICROSCOPIC
Glucose, UA: NEGATIVE
Hgb urine dipstick: NEGATIVE
Hyaline Cast: NONE SEEN /LPF
Nitrite: NEGATIVE
RBC / HPF: NONE SEEN /HPF (ref 0–2)
Specific Gravity, Urine: 1.029 (ref 1.001–1.035)
pH: 5.5 (ref 5.0–8.0)

## 2023-01-05 LAB — MICROSCOPIC MESSAGE

## 2023-01-05 MED ORDER — CEPHALEXIN 500 MG PO CAPS
500.0000 mg | ORAL_CAPSULE | Freq: Four times a day (QID) | ORAL | 0 refills | Status: AC
Start: 2023-01-05 — End: 2023-01-12

## 2023-01-05 NOTE — Progress Notes (Signed)
Subjective:  HPI: [name] [name] is a 57 y.o. male presenting on 01/05/2023 for Acute Visit (Prostate issue: dif urinating)   HPI Patient is in today for difficulty urinating and dribbling with dysuria. Denies fever, chills, body aches, hematuria, lower abdominal pain, or flank pain. He also reports a bump on his penis that has been present for "a while", it is not painful and does not itch. No rectal pressure or difficulty with bowel movements.  Review of Systems  All other systems reviewed and are negative.   Relevant past medical history reviewed and updated as indicated.   Past Medical History:  Diagnosis Date   Anxiety    Arthritis    Chest heaviness    Chills    Depression, major    Diabetes mellitus without complication (HCC)    Dizzy    Dyslipidemia    ED (erectile dysfunction)    Elevated CPK    GERD (gastroesophageal reflux disease)    HA (headache)    Hypertension    Hypertriglyceridemia    Insomnia    Kidney stone    Left-sided Bell's palsy    Morbid obesity (HCC)    Nausea    No appetite    Panniculitis    of right lower leg   Parkinson's disease    PTSD (post-traumatic stress disorder)    Routine screening for STI (sexually transmitted infection) 01/05/2023   Weakness      Past Surgical History:  Procedure Laterality Date   NO PAST SURGERIES      Allergies and medications reviewed and updated.   Current Outpatient Medications:    aspirin EC 81 MG tablet, Take 81 mg by mouth daily., Disp: , Rfl:    atorvastatin (LIPITOR) 20 MG tablet, Take 1 tablet (20 mg total) by mouth daily., Disp: 30 tablet, Rfl: 1   cephALEXin (KEFLEX) 500 MG capsule, Take 1 capsule (500 mg total) by mouth 4 (four) times daily for 7 days., Disp: 28 capsule, Rfl: 0   Cholecalciferol (VITAMIN D3) 1000 units CAPS, Take 1,000 Units by mouth daily., Disp: , Rfl:    clonazePAM (KLONOPIN) 1 MG tablet, Take 1 tablet (1 mg total) by mouth 2 (two) times daily., Disp: 60 tablet, Rfl:  0   divalproex (DEPAKOTE) 500 MG DR tablet, Take 1 tablet (500 mg total) by mouth 2 (two) times daily., Disp: 60 tablet, Rfl: 3   DULoxetine 40 MG CPEP, Take 1 capsule (40 mg total) by mouth daily., Disp: 30 capsule, Rfl: 3   glimepiride (AMARYL) 4 MG tablet, Take 1 tablet (4 mg total) by mouth 2 (two) times daily., Disp: 60 tablet, Rfl: 1   hydrOXYzine (ATARAX) 50 MG tablet, TAKE 1 TABLET BY MOUTH FOUR TIMES A DAY & TAKE 2 TABLETS EVERY NIGHT AT BEDTIME FOR ANXIETY, PANIC, AGITATION, AND SLEEP, Disp: 120 tablet, Rfl: 3   lisinopril (ZESTRIL) 20 MG tablet, TAKE 1 TABLET (20 MG TOTAL) BY MOUTH DAILY., Disp: 30 tablet, Rfl: 1   meloxicam (MOBIC) 7.5 MG tablet, Take 1 tablet (7.5 mg total) by mouth daily., Disp: 30 tablet, Rfl: 0   metFORMIN (GLUCOPHAGE) 500 MG tablet, Take 1 tablet (500 mg total) by mouth 2 (two) times daily. (Patient taking differently: Take 1,000 mg by mouth 2 (two) times daily.), Disp: 60 tablet, Rfl: 1   omeprazole (PRILOSEC OTC) 20 MG tablet, Take 20 mg by mouth daily as needed (for reflux)., Disp: , Rfl:    propranolol (INDERAL) 10 MG tablet, Take 1 tablet (  10 mg total) by mouth 3 (three) times daily., Disp: 90 tablet, Rfl: 3   sildenafil (REVATIO) 20 MG tablet, Take 20 mg by mouth daily as needed (as directed for E.D.)., Disp: , Rfl:    valbenazine (INGREZZA) 60 MG capsule, Take 1 capsule (60 mg total) by mouth daily., Disp: 30 capsule, Rfl: 3  No Known Allergies  Objective:   BP 100/64   Pulse 60   Temp 98.1 F (36.7 C) (Oral)   Ht 5\' 10"  (1.778 m)   Wt 237 lb (107.5 kg)   SpO2 95%   BMI 34.01 kg/m      01/05/2023    2:04 PM 12/08/2022    3:41 PM 11/24/2022    9:02 AM  Vitals with BMI  Height 5\' 10"  5\' 10"  5\' 10"   Weight 237 lbs 240 lbs 231 lbs  BMI 34.01 34.44 33.15  Systolic 100 120   Diastolic 64 80   Pulse 60 67      Physical Exam Vitals and nursing note reviewed. Exam conducted with a chaperone present.  Constitutional:      Appearance: Normal  appearance. He is normal weight.  HENT:     Head: Normocephalic and atraumatic.  Genitourinary:    Penis: Erythema and lesions present.      Prostate: Normal. Not enlarged and not tender.  Skin:    General: Skin is warm and dry.     Capillary Refill: Capillary refill takes less than 2 seconds.  Neurological:     General: No focal deficit present.     Mental Status: He is alert and oriented to person, place, and time. Mental status is at baseline.  Psychiatric:        Mood and Affect: Mood normal.        Behavior: Behavior normal.        Thought Content: Thought content normal.        Judgment: Judgment normal.     Assessment & Plan:  Acute cystitis without hematuria Assessment & Plan: Urine dipstick shows positive for WBC's, positive for protein, positive for urobilinogen, and positive for ketones.  Micro exam: 6-10 WBC's per HPF, few+ bacteria, and contaminated specimen. Will treat with Keflex 500mg  QID for 7 days. He also complains of a bump on his penis,no known exposures but he is sexually active. Will perform STI screening.    Routine screening for STI (sexually transmitted infection) -     HIV Antibody (routine testing w rflx) -     HSV(herpes simplex vrs) 1+2 ab-IgG -     RPR -     C. trachomatis/N. gonorrhoeae RNA -     Trichomonas vaginalis RNA, Ql,Males  Dysuria -     Urinalysis, Routine w reflex microscopic -     Urine Culture  Other orders -     Cephalexin; Take 1 capsule (500 mg total) by mouth 4 (four) times daily for 7 days.  Dispense: 28 capsule; Refill: 0     Follow up plan: No follow-ups on file.  Park Meo, [name]

## 2023-01-05 NOTE — Addendum Note (Signed)
Addended by: Park Meo on: 01/05/2023 03:46 PM   Modules accepted: Orders

## 2023-01-05 NOTE — Assessment & Plan Note (Addendum)
Urine dipstick shows positive for WBC's, positive for protein, positive for urobilinogen, and positive for ketones.  Micro exam: 6-10 WBC's per HPF, few+ bacteria, and contaminated specimen. Will treat with Keflex 500mg  QID for 7 days. He also complains of a bump on his penis,no known exposures but he is sexually active. Will perform STI screening. Positive bilirubin, will check CMP as well.

## 2023-01-06 ENCOUNTER — Other Ambulatory Visit: Payer: Self-pay | Admitting: Family Medicine

## 2023-01-06 DIAGNOSIS — I10 Essential (primary) hypertension: Secondary | ICD-10-CM

## 2023-01-06 LAB — COMPLETE METABOLIC PANEL WITH GFR
AG Ratio: 1.6 (calc) (ref 1.0–2.5)
ALT: 26 U/L (ref 9–46)
AST: 18 U/L (ref 10–35)
Albumin: 4.3 g/dL (ref 3.6–5.1)
Alkaline phosphatase (APISO): 56 U/L (ref 35–144)
BUN: 25 mg/dL (ref 7–25)
CO2: 24 mmol/L (ref 20–32)
Calcium: 9.9 mg/dL (ref 8.6–10.3)
Chloride: 100 mmol/L (ref 98–110)
Creat: 1.13 mg/dL (ref 0.70–1.30)
Globulin: 2.7 g/dL (ref 1.9–3.7)
Glucose, Bld: 133 mg/dL — ABNORMAL HIGH (ref 65–99)
Potassium: 4.5 mmol/L (ref 3.5–5.3)
Sodium: 140 mmol/L (ref 135–146)
Total Bilirubin: 0.5 mg/dL (ref 0.2–1.2)
Total Protein: 7 g/dL (ref 6.1–8.1)
eGFR: 76 mL/min/{1.73_m2} (ref >=60)

## 2023-01-06 LAB — TRICHOMONAS VAGINALIS RNA, QL,MALES: Trichomonas vaginalis RNA: NOT DETECTED

## 2023-01-06 LAB — C. TRACHOMATIS/N. GONORRHOEAE RNA
C. trachomatis RNA, TMA: NOT DETECTED
N. gonorrhoeae RNA, TMA: NOT DETECTED

## 2023-01-06 LAB — URINE CULTURE
MICRO NUMBER:: 15393948
Result:: NO GROWTH
SPECIMEN QUALITY:: ADEQUATE

## 2023-01-06 MED ORDER — LISINOPRIL 20 MG PO TABS: 20.0000 mg | ORAL_TABLET | Freq: Every day | ORAL | 1 refills | Status: DC

## 2023-01-07 LAB — HSV(HERPES SIMPLEX VRS) I + II AB-IGG
HSV 1 IGG,TYPE SPECIFIC AB: 49.3 {index} — ABNORMAL HIGH
HSV 2 IGG,TYPE SPECIFIC AB: 12.3 {index} — ABNORMAL HIGH

## 2023-01-07 LAB — HIV ANTIBODY (ROUTINE TESTING W REFLEX): HIV 1&2 Ab, 4th Generation: NONREACTIVE

## 2023-01-07 LAB — RPR: RPR Ser Ql: NONREACTIVE

## 2023-01-11 ENCOUNTER — Other Ambulatory Visit: Payer: Self-pay | Admitting: Family Medicine

## 2023-01-13 ENCOUNTER — Ambulatory Visit (INDEPENDENT_AMBULATORY_CARE_PROVIDER_SITE_OTHER): Payer: No Typology Code available for payment source | Admitting: Family Medicine

## 2023-01-13 ENCOUNTER — Encounter: Payer: Self-pay | Admitting: Family Medicine

## 2023-01-13 VITALS — BP 120/70 | HR 53 | Temp 97.6°F | Ht 70.0 in | Wt 238.0 lb

## 2023-01-13 DIAGNOSIS — Z23 Encounter for immunization: Secondary | ICD-10-CM | POA: Diagnosis not present

## 2023-01-13 DIAGNOSIS — E782 Mixed hyperlipidemia: Secondary | ICD-10-CM

## 2023-01-13 DIAGNOSIS — I10 Essential (primary) hypertension: Secondary | ICD-10-CM | POA: Diagnosis not present

## 2023-01-13 DIAGNOSIS — R894 Abnormal immunological findings in specimens from other organs, systems and tissues: Secondary | ICD-10-CM

## 2023-01-13 DIAGNOSIS — R251 Tremor, unspecified: Secondary | ICD-10-CM | POA: Diagnosis not present

## 2023-01-13 DIAGNOSIS — E1165 Type 2 diabetes mellitus with hyperglycemia: Secondary | ICD-10-CM | POA: Diagnosis not present

## 2023-01-13 DIAGNOSIS — Z7984 Long term (current) use of oral hypoglycemic drugs: Secondary | ICD-10-CM

## 2023-01-13 NOTE — Assessment & Plan Note (Signed)
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

## 2023-01-13 NOTE — Assessment & Plan Note (Signed)
Well controlled. Last A1c 6.3. Continue Amaryl 4mg  BID and Metformin 500mg  BID.

## 2023-01-13 NOTE — Assessment & Plan Note (Signed)
Continue Atorvastatin 20mg  daily. Fasting labs today.  I recommend consuming a heart healthy diet such as Mediterranean diet or DASH diet with whole grains, fruits, vegetable, fish, lean meats, nuts, and olive oil. Limit sweets and processed foods. I also encourage moderate intensity exercise 150 minutes weekly. This is 3-5 times weekly for 30-50 minutes each session. Goal should be pace of 3 miles/hours, or walking 1.5 miles in 30 minutes. The 10-year ASCVD risk score (Arnett DK, et al., 2019) is: 11.2%

## 2023-01-13 NOTE — Assessment & Plan Note (Signed)
Ongoing persistent. Medication have been optimized. I would like him evaluated by Neurology for Parkinsons. He reports he was told at some point this was likely diagnosis but this does not sound like a formal diagnosis nor does he have documentation of this. Will refer back to Neurology.

## 2023-01-13 NOTE — Assessment & Plan Note (Signed)
Chronic well controlled. Continue Lisinopril 20mg  daily. Encourage heart healthy diet and 150 minutes moderate intensity exercise weekly. Avoid tobacco products. Avoid excess alcohol. Take medications as prescribed and bring medications and blood pressure log with cuff to each office visit. Seek medical care for chest pain, palpitations, shortness of breath with exertion, dizziness/lightheadedness, vision changes, recurrent headaches, or swelling of extremities.

## 2023-01-13 NOTE — Progress Notes (Signed)
Subjective:  HPI: [name] [name] is a 57 y.o. male presenting on 01/13/2023 for Follow-up (3 mos f/u)   HPI Patient is in today for chronic conditions follow-up. He remains on Amaryl and Metformin for his Diabetes and has FBG readings <120, will check A1c today. He does have concerns about an ongoing chronic tremor that is interfering with his day to day life and now beginning to affect his speech. 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 We also discussed his lab findings today and all questions were answered.  DIABETES Hypoglycemic episodes:no Polydipsia/polyuria: no Visual disturbance: no Chest pain: no Paresthesias: no Glucose Monitoring: yes  Accucheck frequency: Daily  Fasting glucose: 90-120s  Post prandial:  Evening:  Before meals: Taking Insulin?: no  Long acting insulin:  Short acting insulin: Blood Pressure Monitoring: not checking Retinal Examination: Up to Date Foot Exam: Up to Date Diabetic Education: Completed Pneumovax: unknown Influenza: Up to Date Aspirin: yes  HYPERTENSION / HYPERLIPIDEMIA Satisfied with current treatment? yes Duration of hypertension: chronic BP monitoring frequency: not checking BP range:  BP medication side effects: no Past BP meds: lisinopril Duration of hyperlipidemia: chronic Cholesterol medication side effects: no Cholesterol supplements: none Past cholesterol medications: atorvastain (lipitor) Medication compliance: excellent compliance Aspirin: yes Recent stressors: no Recurrent headaches: no Visual changes: no Palpitations: no Dyspnea: no Chest pain: no Lower extremity edema: no Dizzy/lightheaded: no     Review of Systems  All other systems reviewed and are negative.   Relevant past medical history reviewed and updated as indicated.   Past Medical History:  Diagnosis Date    Anxiety    Arthritis    Chest heaviness    Chills    Depression, major    Diabetes mellitus without complication (HCC)    Dizzy    Dyslipidemia    ED (erectile dysfunction)    Elevated CPK    GERD (gastroesophageal reflux disease)    HA (headache)    Hypertension    Hypertriglyceridemia    Insomnia    Kidney stone    Left-sided Bell's palsy    Morbid obesity (HCC)    Nausea    No appetite    Panniculitis    of right lower leg   Parkinson's disease    PTSD (post-traumatic stress disorder)    Routine screening for STI (sexually transmitted infection) 01/05/2023   Weakness      Past Surgical History:  Procedure Laterality Date   NO PAST SURGERIES      Allergies and medications reviewed and updated.   Current Outpatient Medications:    aspirin EC 81 MG tablet, Take 81 mg by mouth daily., Disp: , Rfl:    atorvastatin (LIPITOR) 20 MG tablet, Take 1 tablet (20 mg total) by mouth daily., Disp: 30 tablet, Rfl: 1   Cholecalciferol (VITAMIN D3) 1000 units CAPS, Take 1,000 Units by mouth daily., Disp: , Rfl:    clonazePAM (KLONOPIN) 1 MG tablet, Take 1 tablet (1 mg total) by mouth 2 (two) times daily., Disp: 60 tablet, Rfl: 0   divalproex (DEPAKOTE) 500 MG DR tablet, Take 1 tablet (500 mg total) by mouth 2 (two) times daily., Disp: 60 tablet, Rfl: 3   DULoxetine 40 MG CPEP, Take 1 capsule (40 mg total) by mouth daily., Disp: 30 capsule, Rfl: 3   glimepiride (AMARYL) 4 MG tablet, Take 1 tablet (4 mg total)  by mouth 2 (two) times daily., Disp: 60 tablet, Rfl: 1   hydrOXYzine (ATARAX) 50 MG tablet, TAKE 1 TABLET BY MOUTH FOUR TIMES A DAY & TAKE 2 TABLETS EVERY NIGHT AT BEDTIME FOR ANXIETY, PANIC, AGITATION, AND SLEEP, Disp: 120 tablet, Rfl: 3   lisinopril (ZESTRIL) 20 MG tablet, Take 1 tablet (20 mg total) by mouth daily., Disp: 30 tablet, Rfl: 1   meloxicam (MOBIC) 7.5 MG tablet, Take 1 tablet (7.5 mg total) by mouth daily., Disp: 30 tablet, Rfl: 0   metFORMIN (GLUCOPHAGE) 500 MG  tablet, Take 1 tablet (500 mg total) by mouth 2 (two) times daily. (Patient taking differently: Take 1,000 mg by mouth 2 (two) times daily.), Disp: 60 tablet, Rfl: 1   omeprazole (PRILOSEC OTC) 20 MG tablet, Take 20 mg by mouth daily as needed (for reflux)., Disp: , Rfl:    propranolol (INDERAL) 10 MG tablet, Take 1 tablet (10 mg total) by mouth 3 (three) times daily., Disp: 90 tablet, Rfl: 3   sildenafil (REVATIO) 20 MG tablet, Take 20 mg by mouth daily as needed (as directed for E.D.)., Disp: , Rfl:    valbenazine (INGREZZA) 60 MG capsule, Take 1 capsule (60 mg total) by mouth daily., Disp: 30 capsule, Rfl: 3  No Known Allergies  Objective:   BP 120/70   Pulse (!) 53   Temp 97.6 F (36.4 C) (Oral)   Ht 5\' 10"  (1.778 m)   Wt 238 lb (108 kg)   SpO2 95%   BMI 34.15 kg/m      01/13/2023    8:19 AM 01/05/2023    2:04 PM 12/08/2022    3:41 PM  Vitals with BMI  Height 5\' 10"  5\' 10"  5\' 10"   Weight 238 lbs 237 lbs 240 lbs  BMI 34.15 34.01 34.44  Systolic 120 100 161  Diastolic 70 64 80  Pulse 53 60 67     Physical Exam Vitals and nursing note reviewed.  Constitutional:      Appearance: Normal appearance. He is normal weight.  HENT:     Head: Normocephalic and atraumatic.  Cardiovascular:     Rate and Rhythm: Normal rate and regular rhythm.     Pulses: Normal pulses.     Heart sounds: Normal heart sounds.  Pulmonary:     Effort: Pulmonary effort is normal.     Breath sounds: Normal breath sounds.  Skin:    General: Skin is warm and dry.     Capillary Refill: Capillary refill takes less than 2 seconds.  Neurological:     General: No focal deficit present.     Mental Status: He is alert and oriented to person, place, and time. Mental status is at baseline.     Motor: Tremor present.  Psychiatric:        Mood and Affect: Mood normal.        Behavior: Behavior normal.        Thought Content: Thought content normal.        Judgment: Judgment normal.     Assessment & Plan:   Tremor Assessment & Plan: Ongoing persistent. Medication have been optimized. I would like him evaluated by Neurology for Parkinsons. He reports he was told at some point this was likely diagnosis but this does not sound like a formal diagnosis nor does he have documentation of this. Will refer back to Neurology.  Orders: -     Ambulatory referral to Neurology  Type 2 diabetes mellitus with hyperglycemia, without long-term current  use of insulin (HCC) Assessment & Plan: Well controlled. Last A1c 6.3. Continue Amaryl 4mg  BID and Metformin 500mg  BID.  Orders: -     Hemoglobin A1c -     CBC with Differential/Platelet  Mixed hyperlipidemia Assessment & Plan: Continue Atorvastatin 20mg  daily. Fasting labs today.  I recommend consuming a heart healthy diet such as Mediterranean diet or DASH diet with whole grains, fruits, vegetable, fish, lean meats, nuts, and olive oil. Limit sweets and processed foods. I also encourage moderate intensity exercise 150 minutes weekly. This is 3-5 times weekly for 30-50 minutes each session. Goal should be pace of 3 miles/hours, or walking 1.5 miles in 30 minutes. The 10-year ASCVD risk score (Arnett DK, et al., 2019) is: 11.2%   Orders: -     Lipid panel -     CBC with Differential/Platelet  Essential hypertension Assessment & Plan: Chronic well controlled. Continue Lisinopril 20mg  daily. Encourage heart healthy diet and 150 minutes moderate intensity exercise weekly. Avoid tobacco products. Avoid excess alcohol. Take medications as prescribed and bring medications and blood pressure log with cuff to each office visit. Seek medical care for chest pain, palpitations, shortness of breath with exertion, dizziness/lightheadedness, vision changes, recurrent headaches, or swelling of extremities.   Orders: -     CBC with Differential/Platelet  Need for vaccination -     Flu vaccine trivalent PF, 6mos and older(Flulaval,Afluria,Fluarix,Fluzone)  Herpes  simplex antibody positive Assessment & Plan: Discussed positive HSV 1 and 2 with patient as well as clinical course and management.       Follow up plan: Return in about 6 months (around 07/13/2023) for chronic follow-up with labs 1 week prior.  Park Meo, [name]

## 2023-01-14 LAB — CBC WITH DIFFERENTIAL/PLATELET
Absolute Monocytes: 818 {cells}/uL (ref 200–950)
Basophils Absolute: 56 {cells}/uL (ref 0–200)
Basophils Relative: 0.6 %
Eosinophils Absolute: 214 {cells}/uL (ref 15–500)
Eosinophils Relative: 2.3 %
HCT: 48 % (ref 38.5–50.0)
Hemoglobin: 16.2 g/dL (ref 13.2–17.1)
Lymphs Abs: 4483 {cells}/uL — ABNORMAL HIGH (ref 850–3900)
MCH: 29.1 pg (ref 27.0–33.0)
MCHC: 33.8 g/dL (ref 32.0–36.0)
MCV: 86.3 fL (ref 80.0–100.0)
MPV: 9.8 fL (ref 7.5–12.5)
Monocytes Relative: 8.8 %
Neutro Abs: 3729 {cells}/uL (ref 1500–7800)
Neutrophils Relative %: 40.1 %
Platelets: 251 10*3/uL (ref 140–400)
RBC: 5.56 10*6/uL (ref 4.20–5.80)
RDW: 13.7 % (ref 11.0–15.0)
Total Lymphocyte: 48.2 %
WBC: 9.3 10*3/uL (ref 3.8–10.8)

## 2023-01-14 LAB — HEMOGLOBIN A1C
Hgb A1c MFr Bld: 6.4 %{Hb} — ABNORMAL HIGH (ref <5.7)
Mean Plasma Glucose: 137 mg/dL
eAG (mmol/L): 7.6 mmol/L

## 2023-01-14 LAB — LIPID PANEL
Cholesterol: 139 mg/dL (ref <200)
HDL: 34 mg/dL — ABNORMAL LOW (ref >=40)
LDL Cholesterol (Calc): 75 mg/dL
Non-HDL Cholesterol (Calc): 105 mg/dL (ref <130)
Total CHOL/HDL Ratio: 4.1 (calc) (ref <5.0)
Triglycerides: 209 mg/dL — ABNORMAL HIGH (ref <150)

## 2023-01-18 ENCOUNTER — Other Ambulatory Visit: Payer: Self-pay | Admitting: Psychiatry

## 2023-01-19 ENCOUNTER — Other Ambulatory Visit: Payer: Self-pay | Admitting: Psychiatry

## 2023-01-19 ENCOUNTER — Other Ambulatory Visit: Payer: Self-pay | Admitting: Family Medicine

## 2023-02-01 ENCOUNTER — Other Ambulatory Visit: Payer: Self-pay | Admitting: Family Medicine

## 2023-02-09 ENCOUNTER — Other Ambulatory Visit: Payer: Self-pay | Admitting: Physician Assistant

## 2023-02-09 DIAGNOSIS — E1165 Type 2 diabetes mellitus with hyperglycemia: Secondary | ICD-10-CM

## 2023-02-09 MED ORDER — SILDENAFIL CITRATE 20 MG PO TABS: 20.0000 mg | ORAL_TABLET | Freq: Every day | ORAL | 2 refills | Status: DC | PRN

## 2023-02-10 NOTE — Telephone Encounter (Signed)
Requested medication (s) are due for refill today: Yes  Requested medication (s) are on the active medication list: Yes  Last refill:  08/31/22  Future visit scheduled: Yes  Notes to clinic:  Last filled by Maurene Capes, P.A.    Requested Prescriptions  Pending Prescriptions Disp Refills   glimepiride (AMARYL) 4 MG tablet [Pharmacy Med Name: Glimepiride 4MG  TABS] 60 tablet 1    Sig: Take 1 tablet (4 mg total) by mouth 2 (two) times daily.     There is no refill protocol information for this order

## 2023-02-14 ENCOUNTER — Ambulatory Visit: Payer: Medicaid Other | Admitting: Family Medicine

## 2023-02-15 ENCOUNTER — Telehealth: Payer: Self-pay

## 2023-02-15 DIAGNOSIS — E1165 Type 2 diabetes mellitus with hyperglycemia: Secondary | ICD-10-CM

## 2023-02-15 NOTE — Telephone Encounter (Signed)
Pt called in stating that his refill for these meds are still being sent to his previous provider. Pt states that he is almost completely out of these meds. Please advise.  metFORMIN (GLUCOPHAGE) 500 MG tablet [[id number]] glimepiride (AMARYL) 4 MG tablet [[id number]]   LOV: 01/13/23  PHARMACY: [id number] - [place], [place] - [address], [place] [address] Phone: [phone]  Fax: [phone]   CB#: [phone]

## 2023-02-16 MED ORDER — METFORMIN HCL 500 MG PO TABS
500.0000 mg | ORAL_TABLET | Freq: Two times a day (BID) | ORAL | 3 refills | Status: DC
Start: 2023-02-16 — End: 2023-08-22

## 2023-02-18 ENCOUNTER — Other Ambulatory Visit: Payer: Self-pay

## 2023-02-18 ENCOUNTER — Telehealth: Payer: Self-pay

## 2023-02-18 DIAGNOSIS — E1165 Type 2 diabetes mellitus with hyperglycemia: Secondary | ICD-10-CM

## 2023-02-18 MED ORDER — GLIMEPIRIDE 4 MG PO TABS
4.0000 mg | ORAL_TABLET | Freq: Two times a day (BID) | ORAL | 1 refills | Status: DC
Start: 2023-02-18 — End: 2023-08-22

## 2023-02-18 NOTE — Telephone Encounter (Signed)
Pt called in to check on status of refill of this med glimepiride (AMARYL) 4 MG tablet [[id number]]. Pt states that he received his metformin refill but his other med was not available for pick up. Pt is asking if this med could be resent in to pharmacy please.  PHARMACY: [id number] - [place], [place] - [address], [place] [address] Phone: [phone]  Fax: [phone]  CB#: [phone]

## 2023-02-22 ENCOUNTER — Encounter (HOSPITAL_COMMUNITY): Payer: No Typology Code available for payment source | Admitting: Psychiatry

## 2023-02-23 ENCOUNTER — Encounter (HOSPITAL_COMMUNITY): Payer: Medicaid Other | Admitting: Psychiatry

## 2023-03-08 ENCOUNTER — Encounter (HOSPITAL_COMMUNITY): Payer: No Typology Code available for payment source | Admitting: Psychiatry

## 2023-03-10 ENCOUNTER — Encounter (HOSPITAL_COMMUNITY): Payer: Self-pay | Admitting: Psychiatry

## 2023-03-10 ENCOUNTER — Ambulatory Visit (INDEPENDENT_AMBULATORY_CARE_PROVIDER_SITE_OTHER): Payer: No Typology Code available for payment source | Admitting: Psychiatry

## 2023-03-10 DIAGNOSIS — F411 Generalized anxiety disorder: Secondary | ICD-10-CM | POA: Diagnosis not present

## 2023-03-10 DIAGNOSIS — F3130 Bipolar disorder, current episode depressed, mild or moderate severity, unspecified: Secondary | ICD-10-CM | POA: Diagnosis not present

## 2023-03-10 DIAGNOSIS — G2401 Drug induced subacute dyskinesia: Secondary | ICD-10-CM

## 2023-03-10 MED ORDER — DULOXETINE HCL 40 MG PO CPEP: 40.0000 mg | ORAL_CAPSULE | Freq: Every day | ORAL | 3 refills | Status: DC

## 2023-03-10 MED ORDER — DIVALPROEX SODIUM 500 MG PO DR TAB: 500.0000 mg | DELAYED_RELEASE_TABLET | Freq: Two times a day (BID) | ORAL | 3 refills | Status: DC

## 2023-03-10 MED ORDER — PROPRANOLOL HCL 10 MG PO TABS
10.0000 mg | ORAL_TABLET | Freq: Three times a day (TID) | ORAL | 3 refills | Status: DC
Start: 2023-03-10 — End: 2023-06-16

## 2023-03-10 MED ORDER — HYDROXYZINE HCL 50 MG PO TABS: ORAL_TABLET | ORAL | 3 refills | Status: DC

## 2023-03-10 MED ORDER — VALBENAZINE TOSYLATE 80 MG PO CAPS: 80.0000 mg | ORAL_CAPSULE | Freq: Every day | ORAL | 3 refills | Status: DC

## 2023-03-10 NOTE — Progress Notes (Signed)
BH MD/PA/NP OP Progress Note         03/10/2023 3:22 PM DHILLON LAMIE  MRN:  [id number]  Chief Complaint: "I did not restart Cymbalta".   HPI:  57 year old male seen today for follow up psychiatric evaluation. He has a history of anxiety, depression and PTSD.  He has a psychiatric history of depression, PTSD, anxiety, and bipolar disorder.  He is currently managed on Depakote 500 mg twice daily propranolol 10 mg 3 times daily, Ingrezza 60 mg daily, Cymbalta 40 mg, and hydroxyzine 50 mg 4 times daily as needed.  He reports that his medications are somewhat effective in managing his psychiatric conditions.    Today he was well groomed, pleasant, cooperative, engaged in conversation, and maintained eye contact. Patient notes that he is not doing well.  He informed Clinical research associate that he did not restart his Cymbalta.  He informed Clinical research associate that he continues however to have anxiety, depression, and back pain.  Patient notes that he will be interested in restarting this to help manage these conditions.  Patient informed Clinical research associate that he worries about his health and being approved for disability.  He also notes that he worries about his living situation.  Patient home  was sold by his brother.  He now lives with his neighbor.  He reports that he wants to financially help his neighbor more.  Provider conducted a GAD-7 and patient scored a 6.  Provider also conducted PHQ-9 and patient 77.  He endorses adequate sleep and reduced appetite.  Patient informed Clinical research associate that he is lost 15 pounds since his last visit.  He does note that he is on metformin which may reduce his appetite.  Patient continues to suffer from TD.  He notes that at times it makes it difficult for him to hold her phone or a fork.  Provider conducted an aims assessment and patient scored a 6.  At his last visit he scored a 21.  Today patient agreeable to restarting Cymbalta 40 mg daily.  Patient also agreeable to increasing Ingrezza 60 mg to 80 mg to  help manage symptoms of TD.  He will continue other medications as prescribed.  No other concerns at this time.      ICD-10-CM   1. Bipolar I disorder, most recent episode depressed (HCC)  F31.30 divalproex (DEPAKOTE) 500 MG DR tablet    DULoxetine HCl 40 MG CPEP    2. Generalized anxiety disorder  F41.1 DULoxetine HCl 40 MG CPEP    hydrOXYzine (ATARAX) 50 MG tablet    propranolol (INDERAL) 10 MG tablet    3. Tardive dyskinesia  G24.01 valbenazine (INGREZZA) 80 MG capsule              Past Psychiatric History: anxiety, depression and PTSD  Past Medical History:  Past Medical History:  Diagnosis Date   Anxiety    Arthritis    Chest heaviness    Chills    Depression, major    Diabetes mellitus without complication (HCC)    Dizzy    Dyslipidemia    ED (erectile dysfunction)    Elevated CPK    GERD (gastroesophageal reflux disease)    HA (headache)    Hypertension    Hypertriglyceridemia    Insomnia    Kidney stone    Left-sided Bell's palsy    Morbid obesity (HCC)    Nausea    No appetite    Panniculitis    of right lower leg   Parkinson's disease (  HCC)    PTSD (post-traumatic stress disorder)    Routine screening for STI (sexually transmitted infection) 01/05/2023   Weakness     Past Surgical History:  Procedure Laterality Date   NO PAST SURGERIES      Family Psychiatric History: Sister schizophrenia and bipolar disorder  Family History:  Family History  Problem Relation Age of Onset   Thyroid disease Brother    Depression Brother    Heart disease Mother    Anxiety disorder Mother    Kidney failure Father    Diabetes Father    Anxiety disorder Father    Thyroid disease Sister    Mental illness Sister    Heart attack Maternal Grandfather    Heart failure Paternal Grandmother    Cancer Paternal Grandfather        bone    Social History:  Social History   Socioeconomic History   Marital status: Single    Spouse name: Not on file   Number  of children: 0   Years of education: 12   Highest education level: Not on file  Occupational History   Not on file  Tobacco Use   Smoking status: Never   Smokeless tobacco: Never  Vaping Use   Vaping status: Never Used  Substance and Sexual Activity   Alcohol use: No   Drug use: No   Sexual activity: Yes  Other Topics Concern   Not on file  Social History Narrative   Lives with brother in a one story home.  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    Social Determinants of Health   Financial Resource Strain: Not on file  Food Insecurity: No Food Insecurity (11/06/2022)   Hunger Vital Sign    Worried About Running Out of Food in the Last Year: Never true    Ran Out of Food in the Last Year: Never true  Transportation Needs: No Transportation Needs (11/06/2022)   PRAPARE - Administrator, Civil Service (Medical): No    Lack of Transportation (Non-Medical): No  Physical Activity: Not on file  Stress: Not on file  Social Connections: Unknown (09/22/2021)   Received from [facility]   Social Network    Social Network: Not on file    Allergies: No Known Allergies  Metabolic Disorder Labs: Lab Results  Component Value Date   HGBA1C 6.4 (H) 01/13/2023   MPG 137 01/13/2023   No results found for: "PROLACTIN" Lab Results  Component Value Date   CHOL 139 01/13/2023   TRIG 209 (H) 01/13/2023   HDL 34 (L) 01/13/2023   CHOLHDL 4.1 01/13/2023   LDLCALC 75 01/13/2023   LDLCALC 87 09/01/2022   Lab Results  Component Value Date   TSH 0.995 11/07/2022   TSH 2.900 09/01/2022    Therapeutic Level Labs: No results found for: "LITHIUM" No results found for: "VALPROATE" No results found for: "CBMZ"  Current Medications: Current Outpatient Medications  Medication Sig Dispense Refill   aspirin EC 81 MG tablet Take 81 mg by mouth daily.     atorvastatin (LIPITOR) 20 MG tablet Take 1 tablet (20 mg total) by mouth daily. 30 tablet 1    Cholecalciferol (VITAMIN D3) 1000 units CAPS Take 1,000 Units by mouth daily.     clonazePAM (KLONOPIN) 1 MG tablet Take 1 tablet (1 mg total) by mouth 2 (two) times daily. 60 tablet 0   divalproex (DEPAKOTE) 500 MG DR tablet Take 1 tablet (500 mg  total) by mouth 2 (two) times daily. 60 tablet 3   DULoxetine HCl 40 MG CPEP Take 1 capsule (40 mg total) by mouth daily. 30 capsule 3   glimepiride (AMARYL) 4 MG tablet Take 1 tablet (4 mg total) by mouth 2 (two) times daily. 180 tablet 1   hydrOXYzine (ATARAX) 50 MG tablet TAKE 1 TABLET BY MOUTH FOUR TIMES A DAY & TAKE 2 TABLETS EVERY NIGHT AT BEDTIME FOR ANXIETY, PANIC, AGITATION, AND SLEEP 120 tablet 3   lisinopril (ZESTRIL) 20 MG tablet Take 1 tablet (20 mg total) by mouth daily. 30 tablet 1   meloxicam (MOBIC) 7.5 MG tablet Take 1 tablet (7.5 mg total) by mouth daily. 30 tablet 0   metFORMIN (GLUCOPHAGE) 500 MG tablet Take 1 tablet (500 mg total) by mouth 2 (two) times daily. 60 tablet 3   omeprazole (PRILOSEC OTC) 20 MG tablet Take 20 mg by mouth daily as needed (for reflux).     propranolol (INDERAL) 10 MG tablet Take 1 tablet (10 mg total) by mouth 3 (three) times daily. 90 tablet 3   sildenafil (REVATIO) 20 MG tablet Take 1 tablet (20 mg total) by mouth daily as needed (as directed for E.D.). 10 tablet 2   valbenazine (INGREZZA) 80 MG capsule Take 1 capsule (80 mg total) by mouth daily. 30 capsule 3   No current facility-administered medications for this visit.     Musculoskeletal: Strength & Muscle Tone: within normal limits Gait & Station: normal Patient leans: N/A  Psychiatric Specialty Exam: Review of Systems  There were no vitals taken for this visit.There is no height or weight on file to calculate BMI.  General Appearance: Well Groomed  Eye Contact:  Good  Speech:  Clear and Coherent and Normal Rate  Volume:  Normal  Mood:  Anxious and Depressed  Affect:  Congruent  Thought Process:  Coherent, Goal Directed and Linear   Orientation:  Full (Time, Place, and Person)  Thought Content: WDL and Logical   Suicidal Thoughts:  No  Homicidal Thoughts:  No  Memory:  Immediate;   Good Recent;   Good Remote;   Good  Judgement:  Good  Insight:  Good  Psychomotor Activity:  Normal  Concentration:  Concentration: Good and Attention Span: Good  Recall:  Good  Fund of Knowledge: Good  Language: Good  Akathisia:  No  Handed:  Right  AIMS (if indicated): done, 6  Assets:  Communication Skills Desire for Improvement Financial Resources/Insurance Housing Social Support  ADL's:  Intact  Cognition: WNL  Sleep:  Good   Screenings: AIMS    Flowsheet Row Clinical Support from 03/10/2023 in [facility] Clinical Support from 12/09/2022 in [facility] Clinical Support from 11/23/2022 in [facility] ED to Hosp-Admission (Discharged) from 11/05/2022 in [facility]  AIMS Total Score 6 21 4 23       GAD-7    Flowsheet Row Clinical Support from 03/10/2023 in [facility] Office Visit from 01/13/2023 in [facility] Office Visit from 12/08/2022 in [facility] Clinical Support from 11/23/2022 in [facility] Office Visit from 10/13/2022 in [facility]  Total GAD-7 Score 16 21 21 21 21       PHQ2-9    Flowsheet Row Clinical Support from 03/10/2023 in [facility] Office Visit from 01/13/2023 in [facility] Office Visit from 01/05/2023 in [facility] Office Visit from 12/08/2022 in [facility] Clinical Support from 11/23/2022 in [facility]  PHQ-2 Total Score 5 6 6 6 6   PHQ-9 Total Score 19 27 25 26 24       Flowsheet Row Clinical Support from  11/23/2022 in [facility] ED to Hosp-Admission (Discharged) from 11/05/2022 in [facility] Video Visit from 10/21/2021 in [facility]  C-SSRS RISK CATEGORY Error: Q7 should not be populated when Q6 is No No Risk Error: Q7 should not be populated when Q6 is No        Assessment and Plan: Patient endorses symptoms of anxiety, depression, pain, TD. Today patient agreeable to increasing Ingrezza 60 mg to 80 mg to help manage symptoms of TD.  He informed Clinical research associate that he had not started his Cymbalta and was agreeable to restarting it today to help manage anxiety, depression, and pain.  He will continue other medications as prescribed. 1. Bipolar I disorder, most recent episode depressed (HCC)  Continue- divalproex (DEPAKOTE) 500 MG DR tablet; Take 1 tablet (500 mg total) by mouth 2 (two) times daily.  Dispense: 60 tablet; Refill: 3 Restart- DULoxetine HCl 40 MG CPEP; Take 1 capsule (40 mg total) by mouth daily.  Dispense: 30 capsule; Refill: 3  2. Generalized anxiety disorder  Restart- DULoxetine HCl 40 MG CPEP; Take 1 capsule (40 mg total) by mouth daily.  Dispense: 30 capsule; Refill: 3 Continue- hydrOXYzine (ATARAX) 50 MG tablet; TAKE 1 TABLET BY MOUTH FOUR TIMES A DAY & TAKE 2 TABLETS EVERY NIGHT AT BEDTIME FOR ANXIETY, PANIC, AGITATION, AND SLEEP  Dispense: 120 tablet; Refill: 3 Continue- propranolol (INDERAL) 10 MG tablet; Take 1 tablet (10 mg total) by mouth 3 (three) times daily.  Dispense: 90 tablet; Refill: 3  3. Tardive dyskinesia  Increased- valbenazine (INGREZZA) 80 MG capsule; Take 1 capsule (80 mg total) by mouth daily.  Dispense: 30 capsule; Refill: 3    Follow-up in 3 months

## 2023-03-14 ENCOUNTER — Other Ambulatory Visit: Payer: Self-pay | Admitting: Family Medicine

## 2023-03-14 DIAGNOSIS — I10 Essential (primary) hypertension: Secondary | ICD-10-CM

## 2023-03-15 ENCOUNTER — Telehealth (HOSPITAL_COMMUNITY): Payer: Self-pay | Admitting: *Deleted

## 2023-03-15 NOTE — Telephone Encounter (Signed)
[employer] [employer] [place] APPROVED  albenazine (INGREZZA) 80 MG capsule # 30 FOR 30 DAYS  APPROVED 03/15/23  THRU 03/14/24 TRACKING ID # [id number]

## 2023-03-15 NOTE — Telephone Encounter (Signed)
Prior authorization of Ingrezza submitted online with cover my meds. Awaiting decision.

## 2023-04-13 ENCOUNTER — Other Ambulatory Visit: Payer: Self-pay | Admitting: Physician Assistant

## 2023-04-13 DIAGNOSIS — E782 Mixed hyperlipidemia: Secondary | ICD-10-CM

## 2023-04-14 ENCOUNTER — Other Ambulatory Visit: Payer: Self-pay | Admitting: Family Medicine

## 2023-04-14 DIAGNOSIS — E782 Mixed hyperlipidemia: Secondary | ICD-10-CM

## 2023-04-29 ENCOUNTER — Ambulatory Visit: Payer: Self-pay | Admitting: Family Medicine

## 2023-04-29 NOTE — Telephone Encounter (Signed)
Copied from CRM [id number]. Topic: Clinical - Red Word Triage >> Apr 29, 2023  1:12 PM Colletta Maryland S wrote: Red Word that prompted transfer to Nurse Triage: severe back pain   Chief Complaint: Back pain Symptoms: Lower back pain Frequency: Intermittent  Pertinent Negatives: Patient denies numbness, tingling, loss of bowel or bladder control Disposition: [] ED /[] Urgent Care (no appt availability in office) / [x] Appointment(In office/virtual)/ []  [facility]/ [] Home Care/ [] Refused Recommended Disposition /[] [facility]/ []  Follow-up with PCP Additional Notes: Patient reports back pain from fall 5 months ago. Reports pain has been worsening over the last few weeks. Patient reports taking Ibuprofen which helps improve his pain. Patient denies any numbness or tingling, any radiation of pain, or any loss of bowel or bladder control. Appointment made for the patient on 12/31. Patient is agreeable with this time.    Reason for Disposition  Back pain is a chronic symptom (recurrent or ongoing AND present > 4 weeks)  Answer Assessment - Initial Assessment Questions 1. ONSET: "When did the pain begin?"      5 months ago 2. LOCATION: "Where does it hurt?" (upper, mid or lower back)     Lower back 3. SEVERITY: "How bad is the pain?"  (e.g., Scale 1-10; mild, moderate, or severe)   - MILD (1-3): Doesn't interfere with normal activities.    - MODERATE (4-7): Interferes with normal activities or awakens from sleep.    - SEVERE (8-10): Excruciating pain, unable to do any normal activities.      8/10 4. PATTERN: "Is the pain constant?" (e.g., yes, no; constant, intermittent)      Intermittent  5. RADIATION: "Does the pain shoot into your legs or somewhere else?"     No radiation  6. CAUSE:  "What do you think is causing the back pain?"      Fall from 5 months ago 7. BACK OVERUSE:  "Any recent lifting of heavy objects, strenuous work or exercise?"     No 8. MEDICINES: "What have you  taken so far for the pain?" (e.g., nothing, acetaminophen, NSAIDS)     Ibuprofen  9. NEUROLOGIC SYMPTOMS: "Do you have any weakness, numbness, or problems with bowel/bladder control?"     No 10. OTHER SYMPTOMS: "Do you have any other symptoms?" (e.g., fever, abdomen pain, burning with urination, blood in urine)       No  Protocols used: Back Pain-A-AH

## 2023-05-10 ENCOUNTER — Ambulatory Visit (INDEPENDENT_AMBULATORY_CARE_PROVIDER_SITE_OTHER): Payer: Medicaid Other | Admitting: Family Medicine

## 2023-05-10 ENCOUNTER — Encounter: Payer: Self-pay | Admitting: Family Medicine

## 2023-05-10 VITALS — BP 124/80 | HR 66 | Temp 98.6°F | Ht 70.0 in | Wt 214.5 lb

## 2023-05-10 DIAGNOSIS — M545 Low back pain, unspecified: Secondary | ICD-10-CM | POA: Insufficient documentation

## 2023-05-10 DIAGNOSIS — Z7984 Long term (current) use of oral hypoglycemic drugs: Secondary | ICD-10-CM | POA: Diagnosis not present

## 2023-05-10 DIAGNOSIS — E1165 Type 2 diabetes mellitus with hyperglycemia: Secondary | ICD-10-CM

## 2023-05-10 DIAGNOSIS — G8929 Other chronic pain: Secondary | ICD-10-CM | POA: Insufficient documentation

## 2023-05-10 MED ORDER — CYCLOBENZAPRINE HCL 10 MG PO TABS
10.0000 mg | ORAL_TABLET | Freq: Three times a day (TID) | ORAL | 0 refills | Status: DC | PRN
Start: 2023-05-10 — End: 2023-10-13

## 2023-05-10 NOTE — Assessment & Plan Note (Signed)
A1c today. Will increase Metformin to 1000mg  BID per pt report his previous regimen if elevated.

## 2023-05-10 NOTE — Progress Notes (Signed)
 Subjective:  HPI: [name] [name] is a 57 y.o. male presenting on 05/10/2023 for Back Pain (Lower right back pain x 3 weeks. )   HPI Patient is in today for worsening lower back pain over last 3 weeks. No recent trauma, injury, or precipitating event. He reports pain in his right lower back described as sharp and shooting across his back. No pain, sensory change, or weakness in lower extremities. Denies saddle numbness, incontinence of stool or urine.  Has tried advil, ibuprofen, and blue hill gel. Upon medication review Mr Cicio is inquiring why his dose of Metformin  was decreased to 500mg  BID. I was unaware this was a change for him. Will obtain A1c today.   Review of Systems  All other systems reviewed and are negative.   Relevant past medical history reviewed and updated as indicated.   Past Medical History:  Diagnosis Date   Anxiety    Arthritis    Chest heaviness    Chills    Depression, major    Diabetes mellitus without complication (HCC)    Dizzy    Dyslipidemia    ED (erectile dysfunction)    Elevated CPK    GERD (gastroesophageal reflux disease)    HA (headache)    Hypertension    Hypertriglyceridemia    Insomnia    Kidney stone    Left-sided Bell's palsy    Morbid obesity (HCC)    Nausea    No appetite    Panniculitis    of right lower leg   Parkinson's disease (HCC)    PTSD (post-traumatic stress disorder)    Routine screening for STI (sexually transmitted infection) 01/05/2023   Weakness      Past Surgical History:  Procedure Laterality Date   NO PAST SURGERIES      Allergies and medications reviewed and updated.   Current Outpatient Medications:    aspirin  EC 81 MG tablet, Take 81 mg by mouth daily., Disp: , Rfl:    atorvastatin  (LIPITOR) 20 MG tablet, TAKE 1 TABLET (20 MG TOTAL) BY MOUTH DAILY., Disp: 30 tablet, Rfl: 1   Cholecalciferol (VITAMIN D3) 1000 units CAPS, Take 1,000 Units by mouth daily., Disp: , Rfl:    cyclobenzaprine   (FLEXERIL ) 10 MG tablet, Take 1 tablet (10 mg total) by mouth 3 (three) times daily as needed for muscle spasms., Disp: 30 tablet, Rfl: 0   divalproex  (DEPAKOTE ) 500 MG DR tablet, Take 1 tablet (500 mg total) by mouth 2 (two) times daily., Disp: 60 tablet, Rfl: 3   DULoxetine  HCl 40 MG CPEP, Take 1 capsule (40 mg total) by mouth daily., Disp: 30 capsule, Rfl: 3   glimepiride  (AMARYL ) 4 MG tablet, Take 1 tablet (4 mg total) by mouth 2 (two) times daily., Disp: 180 tablet, Rfl: 1   hydrOXYzine  (ATARAX ) 50 MG tablet, TAKE 1 TABLET BY MOUTH FOUR TIMES A DAY & TAKE 2 TABLETS EVERY NIGHT AT BEDTIME FOR ANXIETY, PANIC, AGITATION, AND SLEEP, Disp: 120 tablet, Rfl: 3   lisinopril  (ZESTRIL ) 20 MG tablet, TAKE 1 TABLET BY MOUTH DAILY, Disp: 30 tablet, Rfl: 1   metFORMIN  (GLUCOPHAGE ) 500 MG tablet, Take 1 tablet (500 mg total) by mouth 2 (two) times daily., Disp: 60 tablet, Rfl: 3   propranolol  (INDERAL ) 10 MG tablet, Take 1 tablet (10 mg total) by mouth 3 (three) times daily., Disp: 90 tablet, Rfl: 3   sildenafil  (REVATIO ) 20 MG tablet, Take 1 tablet (20 mg total) by mouth daily as needed (as directed for  E.D.)., Disp: 10 tablet, Rfl: 2   valbenazine  (INGREZZA ) 80 MG capsule, Take 1 capsule (80 mg total) by mouth daily., Disp: 30 capsule, Rfl: 3   clonazePAM  (KLONOPIN ) 1 MG tablet, Take 1 tablet (1 mg total) by mouth 2 (two) times daily. (Patient not taking: Reported on 05/10/2023), Disp: 60 tablet, Rfl: 0   meloxicam  (MOBIC ) 7.5 MG tablet, Take 1 tablet (7.5 mg total) by mouth daily. (Patient not taking: Reported on 05/10/2023), Disp: 30 tablet, Rfl: 0   omeprazole (PRILOSEC OTC) 20 MG tablet, Take 20 mg by mouth daily as needed (for reflux). (Patient not taking: Reported on 05/10/2023), Disp: , Rfl:   No Known Allergies  Objective:   BP 124/80 (BP Location: Left Arm)   Pulse 66   Temp 98.6 F (37 C)   Ht 5' 10 (1.778 m)   Wt 214 lb 8 oz (97.3 kg)   SpO2 98%   BMI 30.78 kg/m      05/10/2023    10:37 AM 03/10/2023    2:47 PM 01/13/2023    8:19 AM  Vitals with BMI  Height 5' 10  5' 10  Weight 214 lbs 8 oz  238 lbs  BMI 30.78  34.15  Systolic 124  120  Diastolic 80  70  Pulse 66  53     Information is confidential and restricted. Go to Review Flowsheets to unlock data.     Physical Exam Vitals and nursing note reviewed.  Constitutional:      Appearance: Normal appearance. He is normal weight.  HENT:     Head: Normocephalic and atraumatic.  Musculoskeletal:     Thoracic back: Normal.     Lumbar back: Normal. No bony tenderness. Normal range of motion.  Skin:    General: Skin is warm and dry.     Capillary Refill: Capillary refill takes less than 2 seconds.  Neurological:     General: No focal deficit present.     Mental Status: He is alert and oriented to person, place, and time. Mental status is at baseline.  Psychiatric:        Mood and Affect: Mood normal.        Behavior: Behavior normal.        Thought Content: Thought content normal.        Judgment: Judgment normal.     Assessment & Plan:  Acute right-sided low back pain without sciatica Assessment & Plan: No red flags on exam. Signs and symptoms consistent with MSK and muscle spasms. Will start Flexeril  10mg  TID PRN. Encouraged to avoid heavy lifting, rest, and stretch. Return to office if symptoms persist or worsen.    Type 2 diabetes mellitus with hyperglycemia, without long-term current use of insulin  (HCC) Assessment & Plan: A1c today. Will increase Metformin  to 1000mg  BID per pt report his previous regimen if elevated.   Orders: -     Hemoglobin A1c  Other orders -     Cyclobenzaprine  HCl; Take 1 tablet (10 mg total) by mouth 3 (three) times daily as needed for muscle spasms.  Dispense: 30 tablet; Refill: 0     Follow up plan: Return if symptoms worsen or fail to improve.  Jeoffrey GORMAN Barrio, [name]

## 2023-05-10 NOTE — Assessment & Plan Note (Signed)
No red flags on exam. Signs and symptoms consistent with MSK and muscle spasms. Will start Flexeril 10mg  TID PRN. Encouraged to avoid heavy lifting, rest, and stretch. Return to office if symptoms persist or worsen.

## 2023-05-11 LAB — HEMOGLOBIN A1C
Hgb A1c MFr Bld: 6 %{Hb} — ABNORMAL HIGH (ref <5.7)
Mean Plasma Glucose: 126 mg/dL
eAG (mmol/L): 7 mmol/L

## 2023-05-12 ENCOUNTER — Telehealth (HOSPITAL_COMMUNITY): Payer: Self-pay | Admitting: *Deleted

## 2023-05-12 NOTE — Telephone Encounter (Signed)
 Submitted PA on Cover My Meds for patients Ingrezza. Waiting on their determination.

## 2023-05-26 ENCOUNTER — Encounter (HOSPITAL_COMMUNITY): Payer: No Typology Code available for payment source | Admitting: Psychiatry

## 2023-06-16 ENCOUNTER — Ambulatory Visit (HOSPITAL_COMMUNITY): Payer: No Typology Code available for payment source | Admitting: Psychiatry

## 2023-06-16 ENCOUNTER — Encounter (HOSPITAL_COMMUNITY): Payer: Self-pay | Admitting: Psychiatry

## 2023-06-16 DIAGNOSIS — G2401 Drug induced subacute dyskinesia: Secondary | ICD-10-CM | POA: Diagnosis not present

## 2023-06-16 DIAGNOSIS — F3130 Bipolar disorder, current episode depressed, mild or moderate severity, unspecified: Secondary | ICD-10-CM | POA: Diagnosis not present

## 2023-06-16 DIAGNOSIS — F411 Generalized anxiety disorder: Secondary | ICD-10-CM

## 2023-06-16 MED ORDER — DIVALPROEX SODIUM 500 MG PO DR TAB: 500.0000 mg | DELAYED_RELEASE_TABLET | Freq: Two times a day (BID) | ORAL | 3 refills | Status: DC

## 2023-06-16 MED ORDER — VALBENAZINE TOSYLATE 80 MG PO CAPS: 80.0000 mg | ORAL_CAPSULE | Freq: Every day | ORAL | 3 refills | Status: DC

## 2023-06-16 MED ORDER — DULOXETINE HCL 40 MG PO CPEP: 40.0000 mg | ORAL_CAPSULE | Freq: Every day | ORAL | 3 refills | Status: DC

## 2023-06-16 MED ORDER — HYDROXYZINE HCL 50 MG PO TABS: ORAL_TABLET | ORAL | 3 refills | Status: DC

## 2023-06-16 MED ORDER — PROPRANOLOL HCL 10 MG PO TABS: 10.0000 mg | ORAL_TABLET | Freq: Three times a day (TID) | ORAL | 3 refills | Status: DC

## 2023-06-16 NOTE — Progress Notes (Signed)
 [facility] MD/PA/NP OP Progress Note         06/16/2023 3:07 PM [name] [name]  MRN:  [id number]  Chief Complaint: I am sleeping a lot more.   HPI:  58 year old male seen today for follow up psychiatric evaluation. He has a history of anxiety, depression and PTSD.  He has a psychiatric history of depression, PTSD, Tardive dyskinesia, anxiety, and bipolar disorder.  He is currently managed on Depakote  500 mg twice daily propranolol  10 mg 3 times daily, Ingrezza  80 mg daily, Cymbalta  40 mg, and hydroxyzine  50 mg 4 times daily as needed.  He reports that his medications are effective in managing his psychiatric conditions.    Today he was well groomed, pleasant, cooperative, engaged in conversation, and maintained eye contact. Patient notes that he has been sleeping on more.  He informed clinical research associate that he takes occasional naps in the daytime.  Patient reports that he likes his increased sleep as his sleep was poor in the past.  Since his last visit he informed writer that his anxiety has somewhat increased.  He notes that he is worried about receiving disability.  Patient reports that difficult for him to do task around the house because of his abnormal muscle movements.  He does note that hIngrezza drastically helped his symptoms of TD.   Patient consists continues to suffer from symptoms of TD.  He has been off of Abilify  since July 2024.  Provider asked patient if he wanted to be referred to neurology for further evaluation.  Patient informed writer that he would like to be referred to neurology for further evaluation to ensure that his symptoms are truly TD.  Provider was agreeable.  Provider conducted an aims assessment and patient scored a 6, at his last visit he scored a 6.  Despite the above stressors patient notes that his anxiety and depression are well-managed. Provider conducted a GAD-7 and patient scored a 16, at his last visit he scored a 6.  Provider also conducted PHQ-9 and patient scored an  11, at his last visit he scored a 19.  He endorses having an adequate appetite.  Today he denies SI/HI/AVH, mania, paranoia.    Patient referred to neurology for further evaluation.  Patient has not had routine psychiatric labs in over 6 months.  Today provider ordered CBC, CMP, LFT, lipid panel, and Depakote .  No other concerns at this time.      ICD-10-CM   1. Tardive dyskinesia  G24.01 valbenazine  (INGREZZA ) 80 MG capsule    Ambulatory referral to Neurology    2. Generalized anxiety disorder  F41.1 propranolol  (INDERAL ) 10 MG tablet    hydrOXYzine  (ATARAX ) 50 MG tablet    DULoxetine  HCl 40 MG CPEP    3. Bipolar I disorder, most recent episode depressed (HCC)  F31.30 Valproic Acid  level    CBC w/Diff/Platelet    Comprehensive Metabolic Panel (CMET)    Hepatic function panel    Thyroid  Panel With TSH    Lipid panel    DULoxetine  HCl 40 MG CPEP    divalproex  (DEPAKOTE ) 500 MG DR tablet    Lipid panel    Thyroid  Panel With TSH    Hepatic function panel    Comprehensive Metabolic Panel (CMET)    CBC w/Diff/Platelet    Valproic Acid  level               Past Psychiatric History: anxiety, depression, Tardive dyskinesia, bipolar disorder, and PTSD  Past Medical History:  Past  Medical History:  Diagnosis Date   Anxiety    Arthritis    Chest heaviness    Chills    Depression, major    Diabetes mellitus without complication (HCC)    Dizzy    Dyslipidemia    ED (erectile dysfunction)    Elevated CPK    GERD (gastroesophageal reflux disease)    HA (headache)    Hypertension    Hypertriglyceridemia    Insomnia    Kidney stone    Left-sided Bell's palsy    Morbid obesity (HCC)    Nausea    No appetite    Panniculitis    of right lower leg   Parkinson's disease (HCC)    PTSD (post-traumatic stress disorder)    Routine screening for STI (sexually transmitted infection) 01/05/2023   Weakness     Past Surgical History:  Procedure Laterality Date   NO PAST  SURGERIES      Family Psychiatric History: Sister schizophrenia and bipolar disorder  Family History:  Family History  Problem Relation Age of Onset   Thyroid  disease Brother    Depression Brother    Heart disease Mother    Anxiety disorder Mother    Kidney failure Father    Diabetes Father    Anxiety disorder Father    Thyroid  disease Sister    Mental illness Sister    Heart attack Maternal Grandfather    Heart failure Paternal Grandmother    Cancer Paternal Grandfather        bone    Social History:  Social History   Socioeconomic History   Marital status: Single    Spouse name: Not on file   Number of children: 0   Years of education: 12   Highest education level: Not on file  Occupational History   Not on file  Tobacco Use   Smoking status: Never   Smokeless tobacco: Never  Vaping Use   Vaping status: Never Used  Substance and Sexual Activity   Alcohol  use: No   Drug use: No   Sexual activity: Yes  Other Topics Concern   Not on file  Social History Narrative   Lives with brother in a one story home.  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    Social Drivers of Corporate Investment Banker Strain: Not on file  Food Insecurity: No Food Insecurity (11/06/2022)   Hunger Vital Sign    Worried About Running Out of Food in the Last Year: Never true    Ran Out of Food in the Last Year: Never true  Transportation Needs: No Transportation Needs (11/06/2022)   PRAPARE - Administrator, Civil Service (Medical): No    Lack of Transportation (Non-Medical): No  Physical Activity: Not on file  Stress: Not on file  Social Connections: Unknown (09/22/2021)   [facility] from [facility]   Social Network    Social Network: Not on file    Allergies: No Known Allergies  Metabolic Disorder Labs: Lab Results  Component Value Date   HGBA1C 6.0 (H) 05/10/2023   MPG 126 05/10/2023   MPG 137 01/13/2023   No results found  for: PROLACTIN Lab Results  Component Value Date   CHOL 139 01/13/2023   TRIG 209 (H) 01/13/2023   HDL 34 (L) 01/13/2023   CHOLHDL 4.1 01/13/2023   LDLCALC 75 01/13/2023   LDLCALC 87 09/01/2022   Lab Results  Component Value Date   TSH 0.995  11/07/2022   TSH 2.900 09/01/2022    Therapeutic Level Labs: No results found for: LITHIUM No results found for: VALPROATE No results found for: CBMZ  Current Medications: Current Outpatient Medications  Medication Sig Dispense Refill   aspirin  EC 81 MG tablet Take 81 mg by mouth daily.     atorvastatin  (LIPITOR) 20 MG tablet TAKE 1 TABLET (20 MG TOTAL) BY MOUTH DAILY. 30 tablet 1   Cholecalciferol (VITAMIN D3) 1000 units CAPS Take 1,000 Units by mouth daily.     clonazePAM  (KLONOPIN ) 1 MG tablet Take 1 tablet (1 mg total) by mouth 2 (two) times daily. (Patient not taking: Reported on 05/10/2023) 60 tablet 0   cyclobenzaprine  (FLEXERIL ) 10 MG tablet Take 1 tablet (10 mg total) by mouth 3 (three) times daily as needed for muscle spasms. 30 tablet 0   divalproex  (DEPAKOTE ) 500 MG DR tablet Take 1 tablet (500 mg total) by mouth 2 (two) times daily. 60 tablet 3   DULoxetine  HCl 40 MG CPEP Take 1 capsule (40 mg total) by mouth daily. 30 capsule 3   glimepiride  (AMARYL ) 4 MG tablet Take 1 tablet (4 mg total) by mouth 2 (two) times daily. 180 tablet 1   hydrOXYzine  (ATARAX ) 50 MG tablet TAKE 1 TABLET BY MOUTH FOUR TIMES A DAY & TAKE 2 TABLETS EVERY NIGHT AT BEDTIME FOR ANXIETY, PANIC, AGITATION, AND SLEEP 120 tablet 3   lisinopril  (ZESTRIL ) 20 MG tablet TAKE 1 TABLET BY MOUTH DAILY 30 tablet 1   meloxicam  (MOBIC ) 7.5 MG tablet Take 1 tablet (7.5 mg total) by mouth daily. (Patient not taking: Reported on 05/10/2023) 30 tablet 0   metFORMIN  (GLUCOPHAGE ) 500 MG tablet Take 1 tablet (500 mg total) by mouth 2 (two) times daily. 60 tablet 3   omeprazole (PRILOSEC OTC) 20 MG tablet Take 20 mg by mouth daily as needed (for reflux). (Patient not  taking: Reported on 05/10/2023)     propranolol  (INDERAL ) 10 MG tablet Take 1 tablet (10 mg total) by mouth 3 (three) times daily. 90 tablet 3   sildenafil  (REVATIO ) 20 MG tablet Take 1 tablet (20 mg total) by mouth daily as needed (as directed for E.D.). 10 tablet 2   valbenazine  (INGREZZA ) 80 MG capsule Take 1 capsule (80 mg total) by mouth daily. 30 capsule 3   No current facility-administered medications for this visit.     Musculoskeletal: Strength & Muscle Tone: within normal limits Gait & Station: normal, broad based Patient leans: N/A  Psychiatric Specialty Exam: Review of Systems  There were no vitals taken for this visit.There is no height or weight on file to calculate BMI.  General Appearance: Well Groomed  Eye Contact:  Good  Speech:  Clear and Coherent and Normal Rate  Volume:  Normal  Mood:  Anxious  Affect:  Congruent  Thought Process:  Coherent, Goal Directed and Linear  Orientation:  Full (Time, Place, and Person)  Thought Content: WDL and Logical   Suicidal Thoughts:  No  Homicidal Thoughts:  No  Memory:  Immediate;   Good Recent;   Good Remote;   Good  Judgement:  Good  Insight:  Good  Psychomotor Activity:  Normal  Concentration:  Concentration: Good and Attention Span: Good  Recall:  Good  Fund of Knowledge: Good  Language: Good  Akathisia:  No  Handed:  Right  AIMS (if indicated): done, 6  Assets:  Communication Skills Desire for Improvement Financial Resources/Insurance Housing Social Support  ADL's:  Intact  Cognition: WNL  Sleep:  Good   Screenings: AIMS    Flowsheet Row Clinical Support from 06/16/2023 in [facility] Clinical Support from 03/10/2023 in [facility] Clinical Support from 12/09/2022 in [facility] Clinical Support from 11/23/2022 in [facility] ED to Hosp-Admission (Discharged) from 11/05/2022 in [facility]  AIMS Total Score 6 6 21 4 23       GAD-7    Flowsheet Row Clinical Support from 06/16/2023 in [facility] Office Visit from 05/10/2023 in [facility] Clinical Support from 03/10/2023 in [facility] Office Visit from 01/13/2023 in [facility] Office Visit from 12/08/2022 in [facility]  Total GAD-7 Score 16 15 16 21 21       PHQ2-9    Flowsheet Row Clinical Support from 06/16/2023 in [facility] Office Visit from 05/10/2023 in [facility] Clinical Support from 03/10/2023 in [facility] Office Visit from 01/13/2023 in [facility] Office Visit from 01/05/2023 in [facility]  PHQ-2 Total Score 2 6 5 6 6   PHQ-9 Total Score 11 19 19 27 25       Flowsheet Row Clinical Support from 11/23/2022 in [facility] ED to Hosp-Admission (Discharged) from 11/05/2022 in [facility] Video Visit from 10/21/2021 in [facility]  C-SSRS RISK CATEGORY Error: Q7 should not be populated when Q6 is No No Risk Error: Q7 should not be populated when Q6 is No        Assessment and Plan: Patient endorses symptoms of anxiety, depression, pain, TD. Patient referred to neurology for further evaluation.  Patient has not had routine psychiatric labs in over 6 months.  Today provider ordered CBC, CMP, LFT, lipid panel, and Depakote .   1. Tardive dyskinesia  Continue- valbenazine  (INGREZZA ) 80 MG capsule; Take 1 capsule (80 mg total) by mouth daily.  Dispense: 30 capsule; Refill: 3 - Ambulatory referral to Neurology  2. Generalized anxiety disorder  Continue- propranolol  (INDERAL ) 10 MG tablet; Take 1 tablet (10 mg total) by mouth 3 (three) times  daily.  Dispense: 90 tablet; Refill: 3 Continue- hydrOXYzine  (ATARAX ) 50 MG tablet; TAKE 1 TABLET BY MOUTH FOUR TIMES A DAY & TAKE 2 TABLETS EVERY NIGHT AT BEDTIME FOR ANXIETY, PANIC, AGITATION, AND SLEEP  Dispense: 120 tablet; Refill: 3 Continue- DULoxetine  HCl 40 MG CPEP; Take 1 capsule (40 mg total) by mouth daily.  Dispense: 30 capsule; Refill: 3  3. Bipolar I disorder, most recent episode depressed (HCC)  - Valproic Acid  level; Future - CBC w/Diff/Platelet; Future - Comprehensive Metabolic Panel (CMET); Future - Hepatic function panel; Future - Thyroid  Panel With TSH; Future - Lipid panel; Future - DULoxetine  HCl 40 MG CPEP; Take 1 capsule (40 mg total) by mouth daily.  Dispense: 30 capsule; Refill: 3 Continue- divalproex  (DEPAKOTE ) 500 MG DR tablet; Take 1 tablet (500 mg total) by mouth 2 (two) times daily.  Dispense: 60 tablet; Refill: 3 - Lipid panel - Thyroid  Panel With TSH - Hepatic function panel - Comprehensive Metabolic Panel (CMET) - CBC w/Diff/Platelet - Valproic Acid  level'  Follow-up in 2.5 months

## 2023-06-20 ENCOUNTER — Other Ambulatory Visit: Payer: Self-pay | Admitting: Family Medicine

## 2023-06-20 DIAGNOSIS — I10 Essential (primary) hypertension: Secondary | ICD-10-CM

## 2023-07-06 ENCOUNTER — Other Ambulatory Visit: Payer: No Typology Code available for payment source

## 2023-07-06 DIAGNOSIS — E1165 Type 2 diabetes mellitus with hyperglycemia: Secondary | ICD-10-CM

## 2023-07-06 DIAGNOSIS — E782 Mixed hyperlipidemia: Secondary | ICD-10-CM

## 2023-07-06 DIAGNOSIS — I10 Essential (primary) hypertension: Secondary | ICD-10-CM

## 2023-07-07 LAB — COMPLETE METABOLIC PANEL WITH GFR
AG Ratio: 1.7 (calc) (ref 1.0–2.5)
ALT: 12 U/L (ref 9–46)
AST: 13 U/L (ref 10–35)
Albumin: 4.2 g/dL (ref 3.6–5.1)
Alkaline phosphatase (APISO): 58 U/L (ref 35–144)
BUN: 16 mg/dL (ref 7–25)
CO2: 33 mmol/L — ABNORMAL HIGH (ref 20–32)
Calcium: 9.7 mg/dL (ref 8.6–10.3)
Chloride: 98 mmol/L (ref 98–110)
Creat: 0.98 mg/dL (ref 0.70–1.30)
Globulin: 2.5 g/dL (ref 1.9–3.7)
Glucose, Bld: 87 mg/dL (ref 65–99)
Potassium: 4.3 mmol/L (ref 3.5–5.3)
Sodium: 140 mmol/L (ref 135–146)
Total Bilirubin: 0.6 mg/dL (ref 0.2–1.2)
Total Protein: 6.7 g/dL (ref 6.1–8.1)
eGFR: 89 mL/min/{1.73_m2} (ref >=60)

## 2023-07-07 LAB — CBC WITH DIFFERENTIAL/PLATELET
Absolute Lymphocytes: 4132 {cells}/uL — ABNORMAL HIGH (ref 850–3900)
Absolute Monocytes: 529 {cells}/uL (ref 200–950)
Basophils Absolute: 63 {cells}/uL (ref 0–200)
Basophils Relative: 0.8 %
Eosinophils Absolute: 119 {cells}/uL (ref 15–500)
Eosinophils Relative: 1.5 %
HCT: 48.5 % (ref 38.5–50.0)
Hemoglobin: 15.7 g/dL (ref 13.2–17.1)
MCH: 27.4 pg (ref 27.0–33.0)
MCHC: 32.4 g/dL (ref 32.0–36.0)
MCV: 84.8 fL (ref 80.0–100.0)
MPV: 9.5 fL (ref 7.5–12.5)
Monocytes Relative: 6.7 %
Neutro Abs: 3057 {cells}/uL (ref 1500–7800)
Neutrophils Relative %: 38.7 %
Platelets: 227 10*3/uL (ref 140–400)
RBC: 5.72 10*6/uL (ref 4.20–5.80)
RDW: 13.4 % (ref 11.0–15.0)
Total Lymphocyte: 52.3 %
WBC: 7.9 10*3/uL (ref 3.8–10.8)

## 2023-07-07 LAB — HEMOGLOBIN A1C
Hgb A1c MFr Bld: 6.2 %{Hb} — ABNORMAL HIGH (ref <5.7)
Mean Plasma Glucose: 131 mg/dL
eAG (mmol/L): 7.3 mmol/L

## 2023-07-07 LAB — PROTEIN / CREATININE RATIO, URINE
Creatinine, Urine: 209 mg/dL (ref 20–320)
Protein/Creat Ratio: 81 mg/g{creat} (ref 25–148)
Protein/Creatinine Ratio: 0.081 mg/mg{creat} (ref 0.025–0.148)
Total Protein, Urine: 17 mg/dL (ref 5–25)

## 2023-07-07 LAB — LIPID PANEL
Cholesterol: 145 mg/dL (ref <200)
HDL: 37 mg/dL — ABNORMAL LOW (ref >=40)
LDL Cholesterol (Calc): 85 mg/dL
Non-HDL Cholesterol (Calc): 108 mg/dL (ref <130)
Total CHOL/HDL Ratio: 3.9 (calc) (ref <5.0)
Triglycerides: 124 mg/dL (ref <150)

## 2023-07-13 ENCOUNTER — Ambulatory Visit: Payer: No Typology Code available for payment source | Admitting: Family Medicine

## 2023-07-18 ENCOUNTER — Other Ambulatory Visit (HOSPITAL_COMMUNITY): Payer: No Typology Code available for payment source

## 2023-07-22 ENCOUNTER — Other Ambulatory Visit: Payer: Self-pay | Admitting: Family Medicine

## 2023-07-22 DIAGNOSIS — E782 Mixed hyperlipidemia: Secondary | ICD-10-CM

## 2023-08-08 ENCOUNTER — Ambulatory Visit: Payer: No Typology Code available for payment source | Admitting: Family Medicine

## 2023-08-09 ENCOUNTER — Encounter: Payer: Self-pay | Admitting: Family Medicine

## 2023-08-22 ENCOUNTER — Other Ambulatory Visit: Payer: Self-pay | Admitting: Family Medicine

## 2023-08-22 DIAGNOSIS — E1165 Type 2 diabetes mellitus with hyperglycemia: Secondary | ICD-10-CM

## 2023-08-26 ENCOUNTER — Encounter (HOSPITAL_COMMUNITY): Payer: Self-pay

## 2023-09-01 ENCOUNTER — Encounter (HOSPITAL_COMMUNITY): Payer: No Typology Code available for payment source | Admitting: Psychiatry

## 2023-09-05 ENCOUNTER — Encounter (HOSPITAL_COMMUNITY): Payer: Self-pay | Admitting: Psychiatry

## 2023-09-05 ENCOUNTER — Ambulatory Visit (INDEPENDENT_AMBULATORY_CARE_PROVIDER_SITE_OTHER): Admitting: Psychiatry

## 2023-09-05 DIAGNOSIS — F3130 Bipolar disorder, current episode depressed, mild or moderate severity, unspecified: Secondary | ICD-10-CM

## 2023-09-05 DIAGNOSIS — G2401 Drug induced subacute dyskinesia: Secondary | ICD-10-CM

## 2023-09-05 DIAGNOSIS — F411 Generalized anxiety disorder: Secondary | ICD-10-CM

## 2023-09-05 MED ORDER — PROPRANOLOL HCL 10 MG PO TABS: 10.0000 mg | ORAL_TABLET | Freq: Three times a day (TID) | ORAL | 3 refills | Status: DC

## 2023-09-05 MED ORDER — HYDROXYZINE HCL 50 MG PO TABS: ORAL_TABLET | ORAL | 3 refills | Status: DC

## 2023-09-05 MED ORDER — DIVALPROEX SODIUM 500 MG PO DR TAB: 500.0000 mg | DELAYED_RELEASE_TABLET | Freq: Two times a day (BID) | ORAL | 3 refills | Status: DC

## 2023-09-05 MED ORDER — VALBENAZINE TOSYLATE 80 MG PO CAPS: 80.0000 mg | ORAL_CAPSULE | Freq: Every day | ORAL | 3 refills | Status: DC

## 2023-09-05 MED ORDER — DULOXETINE HCL 40 MG PO CPEP: 40.0000 mg | ORAL_CAPSULE | Freq: Every day | ORAL | 3 refills | Status: DC

## 2023-09-05 NOTE — Progress Notes (Signed)
 [facility] MD/PA/NP OP Progress Note         09/05/2023 4:08 PM [name] [name]  MRN:  [id number]  Chief Complaint: "I do not think a Ingrezza  is working".   HPI:  58 year old male seen today for follow up psychiatric evaluation. He has a history of anxiety, depression and PTSD.  He has a psychiatric history of depression, PTSD, Tardive dyskinesia, anxiety, and bipolar disorder.  He is currently managed on Depakote  500 mg twice daily propranolol  10 mg 3 times daily, Ingrezza  80 mg daily, Cymbalta  40 mg, and hydroxyzine  50 mg 4 times daily as needed.  He reports that his medications are somewhat effective in managing his psychiatric conditions.    Today he was well groomed, pleasant, cooperative, engaged in conversation, and maintained eye contact. Patient reports that he no longer thinks Ingrezza  is working as much as it used to.  Patient continues to have abnormal facial movements, tremors in his hand, and feet.  Provider conducted an aims assessment and patient scored a 10.  Patient is no longer prescribed Abilify  but continues to suffer with these abnormal muscle movements.  He notes that he was not seen by neurology.  Provider called [facility].  Provider called the practice and was informed that they attempted to schedule the patient's appointment but could not reach him.  He was scheduled for 09/06/2023 at 2:15.  Patient notes that he will wait for assessment prior to changing his medications.   Since his last visit he notes that his anxiety and depression has increased due to his uncontrollable body movements.  Provider conducted GAD-7 and patient scored a 15, at his last visit he scored a 16.  Provider also conducted PHQ-9.  Scored a 17, at his last visit he scored an 11.  He endorses sleeping 8 or more hours nightly.  Patient reports that his appetite has reduced and notes that he is lost over 10 pounds since his last visit.  Today he denies SI/HI/AVH, mania, paranoia.     Patient's blood pressure elevated today.  He notes that he will follow-up with primary care to have this assessed.  Patient referred to neurology for further evaluation.  Patient had some routine labs drawn by his PCP.  He however did not have his Depakote  level drawn.  Today provider reordered LFT, thyroid panel, and Depakote  level.  At this time patient request that medications not be adjusted.  He will follow-up with [facility] for further assessment.   No other concerns at this time.      ICD-10-CM   1. Generalized anxiety disorder  F41.1 hydrOXYzine  (ATARAX ) 50 MG tablet    propranolol  (INDERAL ) 10 MG tablet    DULoxetine  HCl 40 MG CPEP    2. Bipolar I disorder, most recent episode depressed (HCC)  F31.30 DULoxetine  HCl 40 MG CPEP    divalproex  (DEPAKOTE ) 500 MG DR tablet    Hepatic function panel    Thyroid Panel With TSH    Valproic Acid level    3. Tardive dyskinesia  G24.01 valbenazine  (INGREZZA ) 80 MG capsule                Past Psychiatric History: anxiety, depression, Tardive dyskinesia, bipolar disorder, and PTSD  Past Medical History:  Past Medical History:  Diagnosis Date   Anxiety    Arthritis    Chest heaviness    Chills    Depression, major    Diabetes mellitus without complication (HCC)  Dizzy    Dyslipidemia    ED (erectile dysfunction)    Elevated CPK    GERD (gastroesophageal reflux disease)    HA (headache)    Hypertension    Hypertriglyceridemia    Insomnia    Kidney stone    Left-sided Bell's palsy    Morbid obesity (HCC)    Nausea    No appetite    Panniculitis    of right lower leg   Parkinson's disease (HCC)    PTSD (post-traumatic stress disorder)    Routine screening for STI (sexually transmitted infection) 01/05/2023   Weakness     Past Surgical History:  Procedure Laterality Date   NO PAST SURGERIES      Family Psychiatric History: Sister schizophrenia and bipolar disorder  Family History:   Family History  Problem Relation Age of Onset   Thyroid disease Brother    Depression Brother    Heart disease Mother    Anxiety disorder Mother    Kidney failure Father    Diabetes Father    Anxiety disorder Father    Thyroid disease Sister    Mental illness Sister    Heart attack Maternal Grandfather    Heart failure Paternal Grandmother    Cancer Paternal Grandfather        bone    Social History:  Social History   Socioeconomic History   Marital status: Single    Spouse name: Not on file   Number of children: 0   Years of education: 12   Highest education level: Not on file  Occupational History   Not on file  Tobacco Use   Smoking status: Never   Smokeless tobacco: Never  Vaping Use   Vaping status: Never Used  Substance and Sexual Activity   Alcohol use: No   Drug use: No   Sexual activity: Yes  Other Topics Concern   Not on file  Social History Narrative   Lives with brother in a one story home.  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    Social Drivers of Corporate investment banker Strain: Not on file  Food Insecurity: No Food Insecurity (11/06/2022)   Hunger Vital Sign    Worried About Running Out of Food in the Last Year: Never true    Ran Out of Food in the Last Year: Never true  Transportation Needs: No Transportation Needs (11/06/2022)   PRAPARE - Administrator, Civil Service (Medical): No    Lack of Transportation (Non-Medical): No  Physical Activity: Not on file  Stress: Not on file  Social Connections: Unknown (09/22/2021)   Received [facility]   Social Network    Social Network: Not on file    Allergies: No Known Allergies  Metabolic Disorder Labs: Lab Results  Component Value Date   HGBA1C 6.2 (H) 07/06/2023   MPG 131 07/06/2023   MPG 126 05/10/2023   No results found for: "PROLACTIN" Lab Results  Component Value Date   CHOL 145 07/06/2023   TRIG 124 07/06/2023   HDL 37  (L) 07/06/2023   CHOLHDL 3.9 07/06/2023   LDLCALC 85 07/06/2023   LDLCALC 75 01/13/2023   Lab Results  Component Value Date   TSH 0.995 11/07/2022   TSH 2.900 09/01/2022    Therapeutic Level Labs: No results found for: "LITHIUM" No results found for: "VALPROATE" No results found for: "CBMZ"  Current Medications: Current Outpatient Medications  Medication Sig Dispense Refill  aspirin  EC 81 MG tablet Take 81 mg by mouth daily.     atorvastatin  (LIPITOR) 20 MG tablet TAKE 1 TABLET BY MOUTH DAILY 30 tablet 1   Cholecalciferol (VITAMIN D3) 1000 units CAPS Take 1,000 Units by mouth daily.     clonazePAM  (KLONOPIN ) 1 MG tablet Take 1 tablet (1 mg total) by mouth 2 (two) times daily. (Patient not taking: Reported on 05/10/2023) 60 tablet 0   cyclobenzaprine  (FLEXERIL ) 10 MG tablet Take 1 tablet (10 mg total) by mouth 3 (three) times daily as needed for muscle spasms. 30 tablet 0   divalproex  (DEPAKOTE ) 500 MG DR tablet Take 1 tablet (500 mg total) by mouth 2 (two) times daily. 60 tablet 3   DULoxetine  HCl 40 MG CPEP Take 1 capsule (40 mg total) by mouth daily. 30 capsule 3   glimepiride  (AMARYL ) 4 MG tablet TAKE 1 TABLET (4 MG TOTAL) BY MOUTH 2 (TWO) TIMES DAILY. 180 tablet 1   hydrOXYzine  (ATARAX ) 50 MG tablet TAKE 1 TABLET BY MOUTH FOUR TIMES A DAY & TAKE 2 TABLETS EVERY NIGHT AT BEDTIME FOR ANXIETY, PANIC, AGITATION, AND SLEEP 120 tablet 3   lisinopril  (ZESTRIL ) 20 MG tablet TAKE 1 TABLET BY MOUTH DAILY 30 tablet 1   meloxicam  (MOBIC ) 7.5 MG tablet Take 1 tablet (7.5 mg total) by mouth daily. (Patient not taking: Reported on 05/10/2023) 30 tablet 0   metFORMIN  (GLUCOPHAGE ) 500 MG tablet TAKE 1 TABLET (500 MG TOTAL) BY MOUTH 2 (TWO) TIMES DAILY. 60 tablet 3   omeprazole (PRILOSEC OTC) 20 MG tablet Take 20 mg by mouth daily as needed (for reflux). (Patient not taking: Reported on 05/10/2023)     propranolol  (INDERAL ) 10 MG tablet Take 1 tablet (10 mg total) by mouth 3 (three) times daily.  90 tablet 3   sildenafil  (REVATIO ) 20 MG tablet Take 1 tablet (20 mg total) by mouth daily as needed (as directed for E.D.). 10 tablet 2   valbenazine  (INGREZZA ) 80 MG capsule Take 1 capsule (80 mg total) by mouth daily. 30 capsule 3   No current facility-administered medications for this visit.     Musculoskeletal: Strength & Muscle Tone: within normal limits Gait & Station: normal, broad based Patient leans: N/A  Psychiatric Specialty Exam: Review of Systems  There were no vitals taken for this visit.There is no height or weight on file to calculate BMI.  General Appearance: Well Groomed  Eye Contact:  Good  Speech:  Clear and Coherent and Normal Rate  Volume:  Normal  Mood:  Anxious and Depressed  Affect:  Congruent  Thought Process:  Coherent, Goal Directed and Linear  Orientation:  Full (Time, Place, and Person)  Thought Content: WDL and Logical   Suicidal Thoughts:  No  Homicidal Thoughts:  No  Memory:  Immediate;   Good Recent;   Good Remote;   Good  Judgement:  Good  Insight:  Good  Psychomotor Activity:  Normal  Concentration:  Concentration: Good and Attention Span: Good  Recall:  Good  Fund of Knowledge: Good  Language: Good  Akathisia:  No  Handed:  Right  AIMS (if indicated): done, 10  Assets:  Communication Skills Desire for Improvement Financial Resources/Insurance Housing Social Support  ADL's:  Intact  Cognition: WNL  Sleep:  Good   Screenings: AIMS    Flowsheet Row Clinical Support from 06/16/2023 in [facility] Clinical Support from 03/10/2023 in [facility] Clinical Support from 12/09/2022 in [facility] Clinical Support from 11/23/2022 in [facility] ED to Hosp-Admission (Discharged) from 11/05/2022 in [facility]  AIMS Total Score 6 6 21 4 23       GAD-7    Flowsheet Row Clinical Support from 09/05/2023 in  [facility] Clinical Support from 06/16/2023 in [facility] Office Visit from 05/10/2023 in [facility] Clinical Support from 03/10/2023 in [facility] Office Visit from 01/13/2023 in [facility]  Total GAD-7 Score 15 16 15 16 21       PHQ2-9    Flowsheet Row Clinical Support from 09/05/2023 in [facility] Clinical Support from 06/16/2023 in [facility] Office Visit from 05/10/2023 in [facility] Clinical Support from 03/10/2023 in [facility] Office Visit from 01/13/2023 in [facility]  PHQ-2 Total Score 5 2 6 5 6   PHQ-9 Total Score 17 11 19 19 27       Flowsheet Row Clinical Support from 11/23/2022 in [facility] ED to Hosp-Admission (Discharged) from 11/05/2022 in [facility] Video Visit from 10/21/2021 in [facility]  C-SSRS RISK CATEGORY Error: Q7 should not be populated when Q6 is No No Risk Error: Q7 should not be populated when Q6 is No        Assessment and Plan: Patient endorses symptoms of anxiety, depression, and TD. Patient referred to neurology for further evaluation.  Patient had some routine labs drawn by his PCP.  He however did not have his Depakote  level drawn.  Today provider reordered LFT, thyroid panel, and Depakote  level.  At this time patient request that medications not be adjusted.  He will follow-up with [facility] for further assessment.   1. Generalized anxiety disorder  Continue- hydrOXYzine  (ATARAX ) 50 MG tablet; TAKE 1 TABLET BY MOUTH FOUR TIMES A DAY & TAKE 2 TABLETS EVERY NIGHT AT BEDTIME FOR ANXIETY, PANIC, AGITATION, AND SLEEP  Dispense: 120 tablet; Refill: 3 Continue- propranolol   (INDERAL ) 10 MG tablet; Take 1 tablet (10 mg total) by mouth 3 (three) times daily.  Dispense: 90 tablet; Refill: 3 Continue- DULoxetine  HCl 40 MG CPEP; Take 1 capsule (40 mg total) by mouth daily.  Dispense: 30 capsule; Refill: 3  2. Bipolar I disorder, most recent episode depressed (HCC)  Continue- DULoxetine  HCl 40 MG CPEP; Take 1 capsule (40 mg total) by mouth daily.  Dispense: 30 capsule; Refill: 3 Continue- divalproex  (DEPAKOTE ) 500 MG DR tablet; Take 1 tablet (500 mg total) by mouth 2 (two) times daily.  Dispense: 60 tablet; Refill: 3 - Hepatic function panel; Future - Thyroid Panel With TSH; Future - Valproic Acid level; Future  3. Tardive dyskinesia  Continue- valbenazine  (INGREZZA ) 80 MG capsule; Take 1 capsule (80 mg total) by mouth daily.  Dispense: 30 capsule; Refill: 3  Follow-up in 2.5 months

## 2023-09-06 ENCOUNTER — Encounter: Payer: Self-pay | Admitting: Neurology

## 2023-09-06 ENCOUNTER — Ambulatory Visit (INDEPENDENT_AMBULATORY_CARE_PROVIDER_SITE_OTHER): Admitting: Neurology

## 2023-09-06 VITALS — BP 110/80 | HR 77 | Ht 70.0 in | Wt 207.2 lb

## 2023-09-06 DIAGNOSIS — R251 Tremor, unspecified: Secondary | ICD-10-CM

## 2023-09-06 DIAGNOSIS — G20C Parkinsonism, unspecified: Secondary | ICD-10-CM | POA: Diagnosis not present

## 2023-09-06 DIAGNOSIS — Z820 Family history of epilepsy and other diseases of the nervous system: Secondary | ICD-10-CM

## 2023-09-06 NOTE — Progress Notes (Signed)
 Subjective:    Patient ID: [name] [name] is a 58 y.o. male.  HPI    [name] Fairy, MD, PhD [facility] [address] [address] [place], [place] [address]  Dear [name],   I saw your patient, [name] [name], upon your kind request in my neurologic clinic today for initial consultation of his tremor disorder and parkinsonism. The patient is unaccompanied today. As you know, [name] is a 58 y.o. male with an underlying complex medical history of diabetes, arthritis, dizziness, hyperlipidemia, kidney stones, reflux disease, headaches, history of left-sided Bell's palsy, obesity, depression, PTSD, tardive dyskinesia, anxiety, and bipolar disorder, who reports tremors and involuntary movements as well as changes in his gait for the past year to year and a half.  He reports no family history of Parkinson's disease but does report a family history of tremor affecting his paternal grandfather and his mother.  She lived to be 71.  His father lived to be in his 37s.  He has 1 brother with no tremor.  The patient does not work.   The patient reports a fall about 3 months ago, he reports that he felt dizzy.  He did not hit his head.  He was hospitalized last year in June 2020 for for involuntary movements.  He was admitted on 11/05/2022 and discharged on 11/09/2022.  He was felt to have involuntary movements secondary to Abilify  and Wellbutrin .  He was started on Ingrezza  60 mg once daily and valproic acid 250 mg twice daily as well as low-dose Cymbalta , clonazepam  twice daily.  He is currently on Cymbalta  40 mg daily and valproic acid 500 mg twice daily, stopped the clonazepam  as it did not help and is no longer on Flexeril  which was for back pain in the past.  He is on Ingrezza  80 mg daily and does not believe it has helped.  He tries to hydrate well with water, estimates that he drinks about 3 bottles of water per day, no alcohol.  He drinks caffeine in the form of soda,  about 2 cans/day.  He lives with a friend/roommate.  He had seen Dr. Festus Hubert in 2018 for AMS.  He had a CTH wo contrast on 10/13/16 through [facility] ED on 10/13/16 and I reviewed the results:   IMPRESSION: No acute intracranial abnormality.   He had labs on 07/06/23 and I reviewed the results in Epic: BUN was 16, creatinine 0.98.   He has been off of Abilify  for almost a year.  His Past Medical History Is Significant For: Past Medical History:  Diagnosis Date   Anxiety    Arthritis    Chest heaviness    Chills    Depression, major    Diabetes mellitus without complication (HCC)    Dizzy    Dyslipidemia    ED (erectile dysfunction)    Elevated CPK    GERD (gastroesophageal reflux disease)    HA (headache)    Hypertension    Hypertriglyceridemia    Insomnia    Kidney stone    Left-sided Bell's palsy    Morbid obesity (HCC)    Nausea    No appetite    Panniculitis    of right lower leg   Parkinson's disease (HCC)    PTSD (post-traumatic stress disorder)    Routine screening for STI (sexually transmitted infection) 01/05/2023   Weakness     His Past Surgical History Is Significant For: Past Surgical History:  Procedure Laterality Date  NO PAST SURGERIES      His Family History Is Significant For: Family History  Problem Relation Age of Onset   Thyroid disease Brother    Depression Brother    Heart disease Mother    Anxiety disorder Mother    Kidney failure Father    Diabetes Father    Anxiety disorder Father    Thyroid disease Sister    Mental illness Sister    Heart attack Maternal Grandfather    Heart failure Paternal Grandmother    Cancer Paternal Grandfather        bone    His Social History Is Significant For: Social History   Socioeconomic History   Marital status: Single    Spouse name: Not on file   Number of children: 0   Years of education: 12   Highest education level: Not on file  Occupational History   Not on file  Tobacco Use   Smoking  status: Never   Smokeless tobacco: Never  Vaping Use   Vaping status: Never Used  Substance and Sexual Activity   Alcohol use: No   Drug use: Never   Sexual activity: Yes  Other Topics Concern   Not on file  Social History Narrative   Lives with housemate in a one story home.  Has a girlfriend. No children.  Is not working presently (was working [employer]).  Education: high school.    Social Drivers of Corporate investment banker Strain: Not on file  Food Insecurity: No Food Insecurity (11/06/2022)   Hunger Vital Sign    Worried About Running Out of Food in the Last Year: Never true    Ran Out of Food in the Last Year: Never true  Transportation Needs: No Transportation Needs (11/06/2022)   PRAPARE - Administrator, Civil Service (Medical): No    Lack of Transportation (Non-Medical): No  Physical Activity: Not on file  Stress: Not on file  Social Connections: Unknown (09/22/2021)   Received [facility], [facility]   Social Network    Social Network: Not on file    His Allergies Are:  No Known Allergies:   His Current Medications Are:  Outpatient Encounter Medications as of 09/06/2023  Medication Sig   aspirin  EC 81 MG tablet Take 81 mg by mouth daily.   atorvastatin  (LIPITOR) 20 MG tablet TAKE 1 TABLET BY MOUTH DAILY   Cholecalciferol (VITAMIN D3) 1000 units CAPS Take 1,000 Units by mouth daily.   divalproex  (DEPAKOTE ) 500 MG DR tablet Take 1 tablet (500 mg total) by mouth 2 (two) times daily.   DULoxetine  HCl 40 MG CPEP Take 1 capsule (40 mg total) by mouth daily.   glimepiride  (AMARYL ) 4 MG tablet TAKE 1 TABLET (4 MG TOTAL) BY MOUTH 2 (TWO) TIMES DAILY.   hydrOXYzine  (ATARAX ) 50 MG tablet TAKE 1 TABLET BY MOUTH FOUR TIMES A DAY & TAKE 2 TABLETS EVERY NIGHT AT BEDTIME FOR ANXIETY, PANIC, AGITATION, AND SLEEP   lisinopril  (ZESTRIL ) 20 MG tablet TAKE 1 TABLET BY MOUTH DAILY   meloxicam  (MOBIC ) 7.5 MG tablet Take 1 tablet (7.5 mg total) by  mouth daily.   metFORMIN  (GLUCOPHAGE ) 500 MG tablet TAKE 1 TABLET (500 MG TOTAL) BY MOUTH 2 (TWO) TIMES DAILY.   omeprazole (PRILOSEC OTC) 20 MG tablet Take 20 mg by mouth daily as needed (for reflux).   propranolol  (INDERAL ) 10 MG tablet Take 1 tablet (10 mg total) by mouth 3 (three) times daily.  sildenafil  (REVATIO ) 20 MG tablet Take 1 tablet (20 mg total) by mouth daily as needed (as directed for E.D.).   valbenazine  (INGREZZA ) 80 MG capsule Take 1 capsule (80 mg total) by mouth daily.   clonazePAM  (KLONOPIN ) 1 MG tablet Take 1 tablet (1 mg total) by mouth 2 (two) times daily. (Patient not taking: Reported on 09/06/2023)   cyclobenzaprine  (FLEXERIL ) 10 MG tablet Take 1 tablet (10 mg total) by mouth 3 (three) times daily as needed for muscle spasms. (Patient not taking: Reported on 09/06/2023)   No facility-administered encounter medications on file as of 09/06/2023.  :   Review of Systems:  Out of a complete 14 point review of systems, all are reviewed and negative with the exception of these symptoms as listed below:   Review of Systems  Neurological:        Has had movement disorder for about a year.  Seen In ED 10/2022 and diagnosed movement d/o from medications.  Clonazepam  and ingrezza  not helped with tremors.  Started in hands, then arms, legs, now mouth. Tremor sheet done.        Objective:  Neurological Exam  Physical Exam Physical Examination:   Vitals:   09/06/23 1414  BP: 110/80  Pulse: 77  SpO2: 96%    General Examination: The patient is a very pleasant 58 y.o. male in no acute distress. He appears chronically deconditioned, adequately groomed.   HEENT: Normocephalic, atraumatic, pupils are equal, round and reactive to light, extraocular tracking is mildly impaired, corrective eyeglasses in place.  Mild facial masking noted and mild nuchal rigidity noted.  Face with normal sensation, speech with mild hypophonia, scant speech, no obvious dysarthria, no voice tremor.   No carotid bruits.  Airway examination reveals mild mouth dryness, moderate airway crowding.  Tongue protrudes centrally and palate elevates symmetrically, intermittent lower jaw and lip tremor noted, no tongue involuntary movements.    Chest: Clear to auscultation without wheezing, rhonchi or crackles noted.  Heart: S1+S2+0, regular and normal without murmurs, rubs or gallops noted.   Abdomen: Soft, non-tender and non-distended.  Extremities: There is no pitting edema in the distal lower extremities bilaterally.   Skin: Warm and dry without trophic changes noted.   Musculoskeletal: exam reveals no obvious joint deformities.   Neurologically:  Mental status: The patient is awake, alert and oriented in all 4 spheres. His immediate and remote memory, attention, language skills and fund of knowledge are appropriate. There is no evidence of aphasia, agnosia, apraxia or anomia. Speech is clear with normal prosody and enunciation. Thought process is linear. Mood is normal and affect is normal.  Cranial nerves II - XII are as described above under HEENT exam.  Motor exam: Normal bulk, global strength of at least 4 out of 5, mild increase in tone and both upper extremities, no telltale cogwheeling.  He has a mild to moderate resting tremor in both upper extremities and mild to moderate resting tremor in both lower extremities, no telltale lateralization noted.  He has a mild postural tremor in both upper extremities, mild action tremor in both upper extremities, no intention tremor.  Fine motor skills and coordination: Moderate impairment of finger taps, hand movements and rapid alternating patting in both upper extremities as well as foot taps moderately impaired in both lower extremities without telltale lateralization noted.  Reflexes are trace in the upper extremities and absent in both lower extremities.    Romberg is not tested for safety concerns, tandem walk not tested for  same reasons.   On  09/06/2023: On Archimedes spiral drawing he has a coarse tremor with both upper extremities.  Handwriting with the right hand is tremulous but legible, not micrographic.  Cerebellar testing: No dysmetria or intention tremor. There is no truncal or gait ataxia.  Mild difficulty with heel-to-shin but no dysmetria. Sensory exam: intact to light touch in the upper and lower extremities.  Gait, station and balance: He stands with mild difficulty and pushes himself up, posture is mildly stooped for age.  He walks with decreased stride length and pace a decreased arm swing and more pronounced arm and hand tremor bilaterally.   Assessment and Plan:   In summary, FRANKI GENS is a very pleasant 58 y.o.-year old male with an underlying complex medical history of diabetes, arthritis, dizziness, hyperlipidemia, kidney stones, reflux disease, headaches, history of left-sided Bell's palsy, obesity, depression, PTSD, tardive dyskinesia, anxiety, and bipolar disorder, who presents for evaluation of his involuntary movements including tremors, parkinsonism, and tardive dyskinesia noted in the past for which he was started on Ingrezza .  History and examination are not telltale for idiopathic Parkinson's disease due to lack of obvious lateralization primarily.  He has been off of Abilify  for nearly a year from what I can see.  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  I would like to proceed with a DaTscan at this time which may help us  determine if he has primary parkinsonism or secondary parkinsonism.  I explained the test to the patient and we provided written information as well.  We will proceed with the test and keep him posted as to the results, plan a follow-up accordingly.  I did not suggest any medications at this time or medication changes for mind of things at this time.  He is reminded to stay well-hydrated and more rested and we talked about the importance of fall prevention.   He is advised to  continue to limit his caffeine.   This was an extended visit of over 60 minutes due to copious record review involved.    Thank you very much for allowing me to participate in the care of this nice patient. If I can be of any further assistance to you please do not hesitate to call me at [phone].  Sincerely,   [name] Fairy, MD, PhD

## 2023-09-06 NOTE — Patient Instructions (Signed)
 I am not quite sure if you have Parkinson's-like changes from underlying Parkinson's disease or atypical parkinsonism or medication induced parkinsonism from previously taking Abilify .  As discussed, we will proceed with a DaT scan: This is a specialized brain scan designed to help with diagnosis of tremor disorders. A radioactive marker gets injected and the uptake is measured in the brain and compared to normal controls and right side is compared to the left, a change in uptake can help with diagnosis of certain tremor disorders. A brain MRI on the other hand is a brain scan that helps look at the brain structure in more detail overall and look for age-related changes, blood vessel related changes and look for stroke and volume loss which we call atrophy.  We will keep you posted as to the test results by phone call.

## 2023-09-12 ENCOUNTER — Other Ambulatory Visit (HOSPITAL_COMMUNITY)

## 2023-09-29 ENCOUNTER — Encounter (HOSPITAL_COMMUNITY): Admission: RE | Admit: 2023-09-29 | Source: Ambulatory Visit

## 2023-09-29 ENCOUNTER — Encounter (HOSPITAL_COMMUNITY)

## 2023-10-10 ENCOUNTER — Other Ambulatory Visit (HOSPITAL_COMMUNITY)

## 2023-10-10 ENCOUNTER — Encounter (HOSPITAL_COMMUNITY): Payer: Self-pay | Admitting: Psychiatry

## 2023-10-10 ENCOUNTER — Ambulatory Visit (INDEPENDENT_AMBULATORY_CARE_PROVIDER_SITE_OTHER): Admitting: Psychiatry

## 2023-10-10 DIAGNOSIS — F3130 Bipolar disorder, current episode depressed, mild or moderate severity, unspecified: Secondary | ICD-10-CM

## 2023-10-10 DIAGNOSIS — F411 Generalized anxiety disorder: Secondary | ICD-10-CM

## 2023-10-10 DIAGNOSIS — G2401 Drug induced subacute dyskinesia: Secondary | ICD-10-CM

## 2023-10-10 DIAGNOSIS — Z79899 Other long term (current) drug therapy: Secondary | ICD-10-CM

## 2023-10-10 MED ORDER — HYDROXYZINE HCL 50 MG PO TABS: ORAL_TABLET | ORAL | 3 refills | Status: DC

## 2023-10-10 MED ORDER — VALBENAZINE TOSYLATE 80 MG PO CAPS
80.0000 mg | ORAL_CAPSULE | Freq: Every day | ORAL | 3 refills | Status: DC
Start: 2023-10-10 — End: 2023-12-16

## 2023-10-10 MED ORDER — PROPRANOLOL HCL 10 MG PO TABS: 10.0000 mg | ORAL_TABLET | Freq: Three times a day (TID) | ORAL | 3 refills | Status: DC

## 2023-10-10 MED ORDER — DULOXETINE HCL 40 MG PO CPEP: 40.0000 mg | ORAL_CAPSULE | Freq: Every day | ORAL | 3 refills | Status: DC

## 2023-10-10 MED ORDER — DIVALPROEX SODIUM 500 MG PO DR TAB: 500.0000 mg | DELAYED_RELEASE_TABLET | Freq: Two times a day (BID) | ORAL | 3 refills | Status: DC

## 2023-10-10 NOTE — Progress Notes (Signed)
 BH MD/PA/NP OP Progress Note         10/10/2023 12:29 PM [name] [name]  MRN:  [id number]  Chief Complaint: "I am concerned about my health".   HPI:  58 year old male seen today for follow up psychiatric evaluation. He has a history of anxiety, depression and PTSD.  He has a psychiatric history of depression, PTSD, Tardive dyskinesia, anxiety, and bipolar disorder.  He is currently managed on Depakote  500 mg twice daily propranolol  10 mg 3 times daily, Ingrezza  80 mg daily, Cymbalta  40 mg, and hydroxyzine  50 mg 4 times daily as needed.  He reports that his medications are somewhat effective in managing his psychiatric conditions.    Today he was well groomed, pleasant, cooperative, engaged in conversation, and maintained eye contact. Patient reports that he is concerned about his health.  He notes that his symptoms of TD are worsening.  Patient continues to have abnormal facial movements, tremors in his hand, and feet. Patient notes that his tongue for the last 2 months has been moving involuntarily.  Provider conducted an aims assessment and patient scored a 15, at his last visit he scored a 10.  Provider did refer patient to neurology for further assessment.  He has an upcoming appointment on 10/13/2023.  Provider discussed switching from Ingrezza  to [place] pending neurology results as patient was told in the past that he could have early onset of parkinsonism. He endorsed understanding and reports that he will consider this.   Since his last visit he notes that his anxiety and depression has increased due to his uncontrollable body movements.  He reports that this impacts his anxiety and depression.  Today provider conducted GAD-7 and patient scored a 12, at his last visit he scored a 15.  Provider also conducted PHQ-9.  Scored a 15, at his last visit he scored an 97.  He endorses sleeping 8 or more hours daily and reports that he takes 5-hour naps nightly.  Patient reports that his appetite has  reduced and notes that he is lost over 5 pounds since his last visit.  Today he denies SI/HI/AVH, mania, paranoia.    At this time patient request that his medications not be adjusted.  Patient's Depakote  level, HgbA1c, LFT, and CBC were drawn today by nursing staff.   He will follow-up with [facility] for further assessment.   No other concerns at this time.      ICD-10-CM   1. Generalized anxiety disorder  F41.1 hydrOXYzine  (ATARAX ) 50 MG tablet    propranolol  (INDERAL ) 10 MG tablet    DULoxetine  HCl 40 MG CPEP    2. Tardive dyskinesia  G24.01 valbenazine  (INGREZZA ) 80 MG capsule    3. Bipolar I disorder, most recent episode depressed (HCC)  F31.30 divalproex  (DEPAKOTE ) 500 MG DR tablet    DULoxetine  HCl 40 MG CPEP                 Past Psychiatric History: anxiety, depression, Tardive dyskinesia, bipolar disorder, and PTSD  Past Medical History:  Past Medical History:  Diagnosis Date   Anxiety    Arthritis    Chest heaviness    Chills    Depression, major    Diabetes mellitus without complication (HCC)    Dizzy    Dyslipidemia    ED (erectile dysfunction)    Elevated CPK    GERD (gastroesophageal reflux disease)    HA (headache)    Hypertension    Hypertriglyceridemia  Insomnia    Kidney stone    Left-sided Bell's palsy    Morbid obesity (HCC)    Nausea    No appetite    Panniculitis    of right lower leg   Parkinson's disease (HCC)    PTSD (post-traumatic stress disorder)    Routine screening for STI (sexually transmitted infection) 01/05/2023   Weakness     Past Surgical History:  Procedure Laterality Date   NO PAST SURGERIES      Family Psychiatric History: Sister schizophrenia and bipolar disorder  Family History:  Family History  Problem Relation Age of Onset   Thyroid disease Brother    Depression Brother    Heart disease Mother    Anxiety disorder Mother    Kidney failure Father    Diabetes Father    Anxiety  disorder Father    Thyroid disease Sister    Mental illness Sister    Heart attack Maternal Grandfather    Heart failure Paternal Grandmother    Cancer Paternal Grandfather        bone    Social History:  Social History   Socioeconomic History   Marital status: Single    Spouse name: Not on file   Number of children: 0   Years of education: 12   Highest education level: Not on file  Occupational History   Not on file  Tobacco Use   Smoking status: Never   Smokeless tobacco: Never  Vaping Use   Vaping status: Never Used  Substance and Sexual Activity   Alcohol use: No   Drug use: Never   Sexual activity: Yes  Other Topics Concern   Not on file  Social History Narrative   Lives with housemate in a one story home.  Has a girlfriend. No children.  Is not working presently (was working [employer]).  Education: high school.    Social Drivers of Corporate investment banker Strain: Not on file  Food Insecurity: No Food Insecurity (11/06/2022)   Hunger Vital Sign    Worried About Running Out of Food in the Last Year: Never true    Ran Out of Food in the Last Year: Never true  Transportation Needs: No Transportation Needs (11/06/2022)   PRAPARE - Administrator, Civil Service (Medical): No    Lack of Transportation (Non-Medical): No  Physical Activity: Not on file  Stress: Not on file  Social Connections: Unknown (09/22/2021)   [facility]   Social Network    Social Network: Not on file    Allergies: No Known Allergies  Metabolic Disorder Labs: Lab Results  Component Value Date   HGBA1C 6.2 (H) 07/06/2023   MPG 131 07/06/2023   MPG 126 05/10/2023   No results found for: "PROLACTIN" Lab Results  Component Value Date   CHOL 145 07/06/2023   TRIG 124 07/06/2023   HDL 37 (L) 07/06/2023   CHOLHDL 3.9 07/06/2023   LDLCALC 85 07/06/2023   LDLCALC 75 01/13/2023   Lab Results  Component Value Date   TSH 0.995  11/07/2022   TSH 2.900 09/01/2022    Therapeutic Level Labs: No results found for: "LITHIUM" No results found for: "VALPROATE" No results found for: "CBMZ"  Current Medications: Current Outpatient Medications  Medication Sig Dispense Refill   aspirin  EC 81 MG tablet Take 81 mg by mouth daily.     atorvastatin  (LIPITOR) 20 MG tablet TAKE 1 TABLET BY MOUTH DAILY 30  tablet 1   Cholecalciferol (VITAMIN D3) 1000 units CAPS Take 1,000 Units by mouth daily.     clonazePAM  (KLONOPIN ) 1 MG tablet Take 1 tablet (1 mg total) by mouth 2 (two) times daily. (Patient not taking: Reported on 09/06/2023) 60 tablet 0   cyclobenzaprine  (FLEXERIL ) 10 MG tablet Take 1 tablet (10 mg total) by mouth 3 (three) times daily as needed for muscle spasms. (Patient not taking: Reported on 09/06/2023) 30 tablet 0   divalproex  (DEPAKOTE ) 500 MG DR tablet Take 1 tablet (500 mg total) by mouth 2 (two) times daily. 60 tablet 3   DULoxetine  HCl 40 MG CPEP Take 1 capsule (40 mg total) by mouth daily. 30 capsule 3   glimepiride  (AMARYL ) 4 MG tablet TAKE 1 TABLET (4 MG TOTAL) BY MOUTH 2 (TWO) TIMES DAILY. 180 tablet 1   hydrOXYzine  (ATARAX ) 50 MG tablet TAKE 1 TABLET BY MOUTH FOUR TIMES A DAY & TAKE 2 TABLETS EVERY NIGHT AT BEDTIME FOR ANXIETY, PANIC, AGITATION, AND SLEEP 120 tablet 3   lisinopril  (ZESTRIL ) 20 MG tablet TAKE 1 TABLET BY MOUTH DAILY 30 tablet 1   meloxicam  (MOBIC ) 7.5 MG tablet Take 1 tablet (7.5 mg total) by mouth daily. 30 tablet 0   metFORMIN  (GLUCOPHAGE ) 500 MG tablet TAKE 1 TABLET (500 MG TOTAL) BY MOUTH 2 (TWO) TIMES DAILY. 60 tablet 3   omeprazole (PRILOSEC OTC) 20 MG tablet Take 20 mg by mouth daily as needed (for reflux).     propranolol  (INDERAL ) 10 MG tablet Take 1 tablet (10 mg total) by mouth 3 (three) times daily. 90 tablet 3   sildenafil  (REVATIO ) 20 MG tablet Take 1 tablet (20 mg total) by mouth daily as needed (as directed for E.D.). 10 tablet 2   valbenazine  (INGREZZA ) 80 MG capsule Take 1  capsule (80 mg total) by mouth daily. 30 capsule 3   No current facility-administered medications for this visit.     Musculoskeletal: Strength & Muscle Tone: within normal limits Gait & Station: normal, broad based Patient leans: N/A  Psychiatric Specialty Exam: Review of Systems  Blood pressure (!) 126/92, pulse 68, temperature 98.6 F (37 C), height 5\' 10"  (1.778 m), weight 198 lb 9.6 oz (90.1 kg), SpO2 100%.Body mass index is 28.5 kg/m.  General Appearance: Well Groomed  Eye Contact:  Good  Speech:  Clear and Coherent and Normal Rate  Volume:  Normal  Mood:  Anxious and Depressed  Affect:  Congruent  Thought Process:  Coherent, Goal Directed and Linear  Orientation:  Full (Time, Place, and Person)  Thought Content: WDL and Logical   Suicidal Thoughts:  No  Homicidal Thoughts:  No  Memory:  Immediate;   Good Recent;   Good Remote;   Good  Judgement:  Good  Insight:  Good  Psychomotor Activity:  Normal  Concentration:  Concentration: Good and Attention Span: Good  Recall:  Good  Fund of Knowledge: Good  Language: Good  Akathisia:  No  Handed:  Right  AIMS (if indicated): done, 15  Assets:  Communication Skills Desire for Improvement Financial Resources/Insurance Housing Social Support  ADL's:  Intact  Cognition: WNL  Sleep:  Fair   Screenings: AIMS    Flowsheet Row Clinical Support from 10/10/2023 in [facility] Clinical Support from 09/05/2023 in [facility] Clinical Support from 06/16/2023 in [facility] Clinical Support from 03/10/2023 in [facility] Clinical Support from 12/09/2022 in [facility]  AIMS Total Score 15 10 6 6 21       GAD-7    Flowsheet Row Clinical Support from 10/10/2023 in [facility] Clinical Support from 09/05/2023 in [facility] Clinical  Support from 06/16/2023 in [facility] Office Visit from 05/10/2023 in [facility] Clinical Support from 03/10/2023 in [facility]  Total GAD-7 Score 12 15 16 15 16       PHQ2-9    Flowsheet Row Clinical Support from 10/10/2023 in [facility] Clinical Support from 09/05/2023 in [facility] Clinical Support from 06/16/2023 in [facility] Office Visit from 05/10/2023 in [facility] Clinical Support from 03/10/2023 in [facility]  PHQ-2 Total Score 2 5 2 6 5   PHQ-9 Total Score 15 17 11 19 19       Flowsheet Row Clinical Support from 11/23/2022 in [facility] ED to Hosp-Admission (Discharged) from 11/05/2022 in [facility] Video Visit from 10/21/2021 in [facility]  C-SSRS RISK CATEGORY Error: Q7 should not be populated when Q6 is No No Risk Error: Q7 should not be populated when Q6 is No        Assessment and Plan: Patient endorses symptoms of anxiety, depression, and TD. At this time patient request that his medications not be adjusted.  Patient's Depakote  level, HgbA1c, LFT, and CBC were drawn today by nursing staff.   He will follow-up with [facility] for further assessment.   1. Generalized anxiety disorder  Continue- hydrOXYzine  (ATARAX ) 50 MG tablet; TAKE 1 TABLET BY MOUTH FOUR TIMES A DAY & TAKE 2 TABLETS EVERY NIGHT AT BEDTIME FOR ANXIETY, PANIC, AGITATION, AND SLEEP  Dispense: 120 tablet; Refill: 3 Continue- propranolol  (INDERAL ) 10 MG tablet; Take 1 tablet (10 mg total) by mouth 3 (three) times daily.  Dispense: 90 tablet; Refill: 3 Continue- DULoxetine  HCl 40 MG CPEP; Take 1 capsule (40 mg total) by mouth daily.  Dispense: 30 capsule; Refill: 3  2. Bipolar I  disorder, most recent episode depressed (HCC)  Continue- DULoxetine  HCl 40 MG CPEP; Take 1 capsule (40 mg total) by mouth daily.  Dispense: 30 capsule; Refill: 3 Continue- divalproex  (DEPAKOTE ) 500 MG DR tablet; Take 1 tablet (500 mg total) by mouth 2 (two) times daily.  Dispense: 60 tablet; Refill: 3 - Hepatic function panel; Future - Thyroid Panel With TSH; Future - Valproic Acid level; Future  3. Tardive dyskinesia  Continue- valbenazine  (INGREZZA ) 80 MG capsule; Take 1 capsule (80 mg total) by mouth daily.  Dispense: 30 capsule; Refill: 3  Follow-up in 2 months

## 2023-10-10 NOTE — Progress Notes (Signed)
 Pt tolerated labs well in left arm with no complaints.   JNL

## 2023-10-11 ENCOUNTER — Ambulatory Visit (HOSPITAL_COMMUNITY): Payer: Self-pay | Admitting: Psychiatry

## 2023-10-11 LAB — HEPATIC FUNCTION PANEL
ALT: 19 IU/L (ref 0–44)
AST: 19 IU/L (ref 0–40)
Albumin: 4.4 g/dL (ref 3.8–4.9)
Alkaline Phosphatase: 74 IU/L (ref 44–121)
Bilirubin Total: 0.6 mg/dL (ref 0.0–1.2)
Bilirubin, Direct: 0.21 mg/dL (ref 0.00–0.40)
Total Protein: 7 g/dL (ref 6.0–8.5)

## 2023-10-11 LAB — CBC WITH DIFFERENTIAL/PLATELET
Basophils Absolute: 0.1 10*3/uL (ref 0.0–0.2)
Basos: 1 %
EOS (ABSOLUTE): 0.2 10*3/uL (ref 0.0–0.4)
Eos: 2 %
Hematocrit: 52.2 % — ABNORMAL HIGH (ref 37.5–51.0)
Hemoglobin: 17.3 g/dL (ref 13.0–17.7)
Immature Grans (Abs): 0 10*3/uL (ref 0.0–0.1)
Immature Granulocytes: 0 %
Lymphocytes Absolute: 4.2 10*3/uL — ABNORMAL HIGH (ref 0.7–3.1)
Lymphs: 44 %
MCH: 27.8 pg (ref 26.6–33.0)
MCHC: 33.1 g/dL (ref 31.5–35.7)
MCV: 84 fL (ref 79–97)
Monocytes Absolute: 0.6 10*3/uL (ref 0.1–0.9)
Monocytes: 7 %
Neutrophils Absolute: 4.6 10*3/uL (ref 1.4–7.0)
Neutrophils: 46 %
Platelets: 227 10*3/uL (ref 150–450)
RBC: 6.22 x10E6/uL — ABNORMAL HIGH (ref 4.14–5.80)
RDW: 16.5 % — ABNORMAL HIGH (ref 11.6–15.4)
WBC: 9.7 10*3/uL (ref 3.4–10.8)

## 2023-10-11 LAB — THYROID PANEL WITH TSH
Free Thyroxine Index: 1.5 (ref 1.2–4.9)
T3 Uptake Ratio: 28 % (ref 24–39)
T4, Total: 5.4 ug/dL (ref 4.5–12.0)
TSH: 4.99 u[IU]/mL — ABNORMAL HIGH (ref 0.450–4.500)

## 2023-10-11 LAB — VALPROIC ACID LEVEL: Valproic Acid Lvl: 60 ug/mL (ref 50–100)

## 2023-10-11 LAB — HEMOGLOBIN A1C
Est. average glucose Bld gHb Est-mCnc: 123 mg/dL
Hgb A1c MFr Bld: 5.9 % — ABNORMAL HIGH (ref 4.8–5.6)

## 2023-10-11 NOTE — Progress Notes (Signed)
 Provider discussed recent labs. Patient has no concerns at this time.

## 2023-10-13 ENCOUNTER — Ambulatory Visit (INDEPENDENT_AMBULATORY_CARE_PROVIDER_SITE_OTHER): Admitting: Family Medicine

## 2023-10-13 ENCOUNTER — Encounter: Payer: Self-pay | Admitting: Family Medicine

## 2023-10-13 VITALS — BP 120/82 | HR 63 | Temp 97.6°F | Ht 70.0 in | Wt 199.4 lb

## 2023-10-13 DIAGNOSIS — L0291 Cutaneous abscess, unspecified: Secondary | ICD-10-CM | POA: Insufficient documentation

## 2023-10-13 MED ORDER — DOXYCYCLINE HYCLATE 100 MG PO TABS
100.0000 mg | ORAL_TABLET | Freq: Two times a day (BID) | ORAL | 0 refills | Status: DC
Start: 2023-10-13 — End: 2023-10-19

## 2023-10-13 NOTE — Progress Notes (Signed)
 Subjective:  HPI: [name] [name] is a 58 y.o. male presenting on 10/13/2023 for Acute Visit (Hard painful Cyst on groin area is not leaking fluid )   HPI Patient is in today for left groin cyst that has been present for several days. It is painful and red, not draining. Denies fever, chills, body aches, N/V. Has tried nothing. Has not had anything like this in past. No injury or trauma to the area.   I & D  Date/Time: 10/13/2023 11:44 AM  Performed by: Jenelle Mis, [name] Authorized by: Jenelle Mis, [name]   Consent:    Consent obtained:  Verbal   Consent given by:  Patient   Risks, benefits, and alternatives were discussed: yes     Risks discussed:  Bleeding, incomplete drainage, pain and infection   Alternatives discussed:  Delayed treatment Location:    Type:  Abscess   Size:  2-3cm   Location:  Trunk (left groin) Pre-procedure details:    Skin preparation:  Povidone-iodine Sedation:    Sedation type:  None Anesthesia:    Anesthesia method:  Local infiltration   Local anesthetic:  Lidocaine  1% WITH epi Procedure type:    Complexity:  Complex Procedure details:    Incision types:  Single straight   Scalpel blade:  11   Wound management:  Probed and deloculated   Drainage:  Bloody and purulent   Drainage amount:  Moderate   Wound treatment:  Wound left open   Packing materials:  1/4 in iodoform gauze   Amount 1/4" iodoform:  3-4in Post-procedure details:    Procedure completion:  Tolerated well, no immediate complications    Review of Systems  All other systems reviewed and are negative.   Relevant past medical history reviewed and updated as indicated.   Past Medical History:  Diagnosis Date   Anxiety    Arthritis    Chest heaviness    Chills    Depression, major    Diabetes mellitus without complication (HCC)    Dizzy    Dyslipidemia    ED (erectile dysfunction)    Elevated CPK    GERD (gastroesophageal reflux disease)    HA (headache)     Hypertension    Hypertriglyceridemia    Insomnia    Kidney stone    Left-sided Bell's palsy    Morbid obesity (HCC)    Nausea    No appetite    Panniculitis    of right lower leg   Parkinson's disease (HCC)    PTSD (post-traumatic stress disorder)    Routine screening for STI (sexually transmitted infection) 01/05/2023   Weakness      Past Surgical History:  Procedure Laterality Date   NO PAST SURGERIES      Allergies and medications reviewed and updated.   Current Outpatient Medications:    aspirin  EC 81 MG tablet, Take 81 mg by mouth daily., Disp: , Rfl:    atorvastatin  (LIPITOR) 20 MG tablet, TAKE 1 TABLET BY MOUTH DAILY, Disp: 30 tablet, Rfl: 1   Cholecalciferol (VITAMIN D3) 1000 units CAPS, Take 1,000 Units by mouth daily., Disp: , Rfl:    divalproex  (DEPAKOTE ) 500 MG DR tablet, Take 1 tablet (500 mg total) by mouth 2 (two) times daily., Disp: 60 tablet, Rfl: 3   doxycycline (VIBRA-TABS) 100 MG tablet, Take 1 tablet (100 mg total) by mouth 2 (two) times daily for 7 days., Disp: 14 tablet, Rfl: 0   DULoxetine  HCl 40 MG CPEP, Take 1  capsule (40 mg total) by mouth daily., Disp: 30 capsule, Rfl: 3   glimepiride  (AMARYL ) 4 MG tablet, TAKE 1 TABLET (4 MG TOTAL) BY MOUTH 2 (TWO) TIMES DAILY., Disp: 180 tablet, Rfl: 1   hydrOXYzine  (ATARAX ) 50 MG tablet, TAKE 1 TABLET BY MOUTH FOUR TIMES A DAY & TAKE 2 TABLETS EVERY NIGHT AT BEDTIME FOR ANXIETY, PANIC, AGITATION, AND SLEEP, Disp: 120 tablet, Rfl: 3   lisinopril  (ZESTRIL ) 20 MG tablet, TAKE 1 TABLET BY MOUTH DAILY, Disp: 30 tablet, Rfl: 1   meloxicam  (MOBIC ) 7.5 MG tablet, Take 1 tablet (7.5 mg total) by mouth daily., Disp: 30 tablet, Rfl: 0   metFORMIN  (GLUCOPHAGE ) 500 MG tablet, TAKE 1 TABLET (500 MG TOTAL) BY MOUTH 2 (TWO) TIMES DAILY., Disp: 60 tablet, Rfl: 3   omeprazole (PRILOSEC OTC) 20 MG tablet, Take 20 mg by mouth daily as needed (for reflux)., Disp: , Rfl:    propranolol  (INDERAL ) 10 MG tablet, Take 1 tablet (10 mg total)  by mouth 3 (three) times daily., Disp: 90 tablet, Rfl: 3   sildenafil  (REVATIO ) 20 MG tablet, Take 1 tablet (20 mg total) by mouth daily as needed (as directed for E.D.)., Disp: 10 tablet, Rfl: 2   valbenazine  (INGREZZA ) 80 MG capsule, Take 1 capsule (80 mg total) by mouth daily., Disp: 30 capsule, Rfl: 3  No Known Allergies  Objective:   BP 120/82   Pulse 63   Temp 97.6 F (36.4 C)   Ht 5\' 10"  (1.778 m)   Wt 199 lb 6.4 oz (90.4 kg)   SpO2 97%   BMI 28.61 kg/m      10/13/2023    9:13 AM 10/10/2023   11:43 AM 09/06/2023    2:14 PM  Vitals with BMI  Height 5\' 10"   5\' 10"   Weight 199 lbs 6 oz  207 lbs 3 oz  BMI 28.61  29.73  Systolic 120  110  Diastolic 82  80  Pulse 63  77     Information is confidential and restricted. Go to Review Flowsheets to unlock data.     Physical Exam Vitals and nursing note reviewed.  Constitutional:      Appearance: Normal appearance. He is normal weight.  HENT:     Head: Normocephalic and atraumatic.  Skin:    General: Skin is warm and dry.     Capillary Refill: Capillary refill takes less than 2 seconds.     Findings: Abscess and bruising present.          Comments: Approx 2-3 cm fluctuant nodule to left groin  Neurological:     General: No focal deficit present.     Mental Status: He is alert and oriented to person, place, and time. Mental status is at baseline.  Psychiatric:        Mood and Affect: Mood normal.        Behavior: Behavior normal.        Thought Content: Thought content normal.        Judgment: Judgment normal.     Assessment & Plan:  Abscess Assessment & Plan: I anesthetized the skin with 1% Lidocaine  with epi. Using a #11 blade, I lanced the abscess. I drained the purulent contents to the best of my ability. I then probed the wound with several Q-tip soaked in peroxide to clean the wound and packed with 3-4 inches of 1/4 inch iodoform gauze. Wound care was discussed. Anticipate 10 to 14 days for wound healing once  the gauze  has been removed. Start Doxy 100mg  BID x7 days    Other orders -     Doxycycline Hyclate; Take 1 tablet (100 mg total) by mouth 2 (two) times daily for 7 days.  Dispense: 14 tablet; Refill: 0 -     I & D     Follow up plan: Return in about 1 week (around 10/20/2023) for follow-up.  Jenelle Mis, [name]

## 2023-10-13 NOTE — Assessment & Plan Note (Addendum)
 I anesthetized the skin with 1% Lidocaine  with epi. Using a #11 blade, I lanced the abscess. I drained the purulent contents to the best of my ability. I then probed the wound with several Q-tip soaked in peroxide to clean the wound and packed with 3-4 inches of 1/4 inch iodoform gauze. Wound care was discussed. Anticipate 10 to 14 days for wound healing once the gauze has been removed. Start Doxy 100mg  BID x7 days

## 2023-10-14 ENCOUNTER — Encounter (HOSPITAL_COMMUNITY)
Admission: RE | Admit: 2023-10-14 | Discharge: 2023-10-14 | Disposition: A | Discharge status: Home / Self Care | Source: Ambulatory Visit | Attending: Neurology | Admitting: Neurology

## 2023-10-14 DIAGNOSIS — R251 Tremor, unspecified: Secondary | ICD-10-CM | POA: Diagnosis present

## 2023-10-14 DIAGNOSIS — Z820 Family history of epilepsy and other diseases of the nervous system: Secondary | ICD-10-CM | POA: Diagnosis present

## 2023-10-14 DIAGNOSIS — G20C Parkinsonism, unspecified: Secondary | ICD-10-CM | POA: Diagnosis present

## 2023-10-14 MED ORDER — IOFLUPANE I 123 185 MBQ/2.5ML IV SOLN
4.5000 | Freq: Once | INTRAVENOUS | Status: AC | PRN
  Administered 2023-10-14: 4.5 via INTRAVENOUS

## 2023-10-14 MED ORDER — POTASSIUM IODIDE (ANTIDOTE) 130 MG PO TABS
ORAL_TABLET | ORAL | Status: AC
Start: 2023-10-14 — End: ?
  Filled 2023-10-14: qty 1

## 2023-10-19 ENCOUNTER — Ambulatory Visit (INDEPENDENT_AMBULATORY_CARE_PROVIDER_SITE_OTHER): Admitting: Family Medicine

## 2023-10-19 ENCOUNTER — Encounter: Payer: Self-pay | Admitting: Family Medicine

## 2023-10-19 VITALS — BP 122/78 | HR 64 | Temp 97.5°F | Ht 70.0 in | Wt 199.0 lb

## 2023-10-19 DIAGNOSIS — L02214 Cutaneous abscess of groin: Secondary | ICD-10-CM

## 2023-10-19 DIAGNOSIS — Z7984 Long term (current) use of oral hypoglycemic drugs: Secondary | ICD-10-CM

## 2023-10-19 DIAGNOSIS — E1165 Type 2 diabetes mellitus with hyperglycemia: Secondary | ICD-10-CM

## 2023-10-19 MED ORDER — CIPROFLOXACIN HCL 500 MG PO TABS
500.0000 mg | ORAL_TABLET | Freq: Two times a day (BID) | ORAL | 0 refills | Status: AC
Start: 2023-10-19 — End: 2023-10-26

## 2023-10-19 MED ORDER — DOXYCYCLINE HYCLATE 100 MG PO TABS
100.0000 mg | ORAL_TABLET | Freq: Two times a day (BID) | ORAL | 0 refills | Status: AC
Start: 2023-10-19 — End: 2023-10-26

## 2023-10-19 NOTE — Progress Notes (Signed)
 Patient Office Visit  Assessment & Plan:  Abscess of left groin -     Doxycycline  Hyclate; Take 1 tablet (100 mg total) by mouth 2 (two) times daily for 7 days.  Dispense: 14 tablet; Refill: 0  Type 2 diabetes mellitus with hyperglycemia, without long-term current use of insulin  (HCC)   Assessment and Plan    Left groin abscess Left groin abscess with persistent induration and odor, indicating ongoing infection. Diabetic status may affect healing. - Prescribed additional doxycycline . May also need Cipro to cover for possible Pseudomonas infection. Will contact patient re this to make sure he can tolerate Cipro.  - Advised to monitor for increased redness, swelling, or worsening symptoms and report if these occur. - Apply antibiotic cream to the affected area. - Schedule follow-up appointment with Amber when available or come in if not improving - Advise to send a message via mychart if symptoms do not improve.  Diabetes Mellitus Type 2 Diabetes well-controlled with last A1c at 6.2. Weight loss has improved glycemic control. - Continue current diabetes management plan.          No follow-ups on file.   Subjective:     Patient ID: [name] [name], male    DOB: [birth year]  Age: 58 y.o. MRN: [id number]  Chief Complaint  Patient presents with   Cyst    F/u on L sided groin cyst. Pt had it packed last week.     HPI Discussed the use of AI scribe software for clinical note transcription with the patient, who gave verbal consent to proceed.  History of Present Illness   [name] [name] is a 58 year old male with diabetes who presents with a left groin abscess.  The left groin abscess was initially treated on October 13, 2023, with incision and drainage, followed by packing. He was prescribed a 7-day course of doxycycline , which he has tolerated well and is due to complete tomorrow. The abscess was initially very painful and large, but he estimates it to be about 70% improved.  The pain has significantly decreased, and the size of the abscess has reduced. However, there is a persistent foul-smelling odor, minor bleeding, and the area still feels somewhat hard.  He has diabetes, which may impact the healing process. His A1c was 6.2 in February, indicating good diabetes control. For pain management, he has been taking Advil. He has been washing the area with a warm cloth and noticed improvement the same day it was initially opened and packed.  No fever, chills, or redness around the abscess area.      [name] [name] is a 58 year old male with diabetes who presents with a left groin abscess.  The left groin abscess was initially treated on October 13, 2023, with incision and drainage, followed by packing. He was prescribed a 7-day course of doxycycline , which he has tolerated well and is due to complete tomorrow. The abscess was initially very painful and large, but he estimates it to be about 70% improved. The pain has significantly decreased, and the size of the abscess has reduced. However, there is a persistent foul-smelling odor, minor bleeding, and the area still feels somewhat hard.  He has diabetes, which may impact the healing process. His A1c was 6.2 in February, indicating good diabetes control. For pain management, he has been taking Advil. He has been washing the area with a warm cloth and noticed improvement the same day it was initially opened and packed.  No  fever, chills, or redness around the abscess area. Physical Exam ABDOMEN: Left groin abscess opened and packed. Induration and surrounding erythema present. Foul smelling odor from packing. Area still indurated, non-tender on palpation. Results Procedure: Packing removal Description: Packing was removed from the left groin abscess. Induration and surrounding erythema were noted. Foul-smelling odor from the packing was observed.  LABS A1c: 6.2 (06/2023)  PATHOLOGY Left groin abscess: Opened and packed  (10/13/2023) Assessment & Plan Left groin abscess improved (culture not done) Left groin abscess with persistent induration and odor, indicating ongoing infection. Diabetic status may affect healing. - Prescribed additional doxycycline . May also need Cipro to cover for possible Pseudomonas infection. Will contact patient re this to make sure he can tolerate Cipro.  - Advised to monitor for increased redness, swelling, or worsening symptoms and report if these occur. - Apply antibiotic cream to the affected area. - Schedule follow-up appointment with Amber when available or come in if not improving - Advise to send a message via mychart if symptoms do not improve.  Diabetes Mellitus Type 2 Diabetes well-controlled with last A1c at 6.2. Weight loss has improved glycemic control. - Continue current diabetes management plan.    The 10-year ASCVD risk score (Arnett DK, et al., 2019) is: 13.4%  Past Medical History:  Diagnosis Date   Anxiety    Arthritis    Chest heaviness    Chills    Depression, major    Diabetes mellitus without complication (HCC)    Dizzy    Dyslipidemia    ED (erectile dysfunction)    Elevated CPK    GERD (gastroesophageal reflux disease)    HA (headache)    Hypertension    Hypertriglyceridemia    Insomnia    Kidney stone    Left-sided Bell's palsy    Morbid obesity (HCC)    Nausea    No appetite    Panniculitis    of right lower leg   Parkinson's disease (HCC)    PTSD (post-traumatic stress disorder)    Routine screening for STI (sexually transmitted infection) 01/05/2023   Weakness    Past Surgical History:  Procedure Laterality Date   NO PAST SURGERIES     Social History   Tobacco Use   Smoking status: Never   Smokeless tobacco: Never  Vaping Use   Vaping status: Never Used  Substance Use Topics   Alcohol use: No   Drug use: Never   Family History  Problem Relation Age of Onset   Thyroid  disease Brother    Depression Brother    Heart  disease Mother    Anxiety disorder Mother    Kidney failure Father    Diabetes Father    Anxiety disorder Father    Thyroid  disease Sister    Mental illness Sister    Heart attack Maternal Grandfather    Heart failure Paternal Grandmother    Cancer Paternal Grandfather        bone   No Known Allergies  ROS    Objective:    BP 122/78   Pulse 64   Temp (!) 97.5 F (36.4 C)   Ht 5' 10 (1.778 m)   Wt 199 lb (90.3 kg)   SpO2 98%   BMI 28.55 kg/m  BP Readings from Last 3 Encounters:  10/19/23 122/78  10/13/23 120/82  09/06/23 110/80   Wt Readings from Last 3 Encounters:  10/19/23 199 lb (90.3 kg)  10/13/23 199 lb 6.4 oz (90.4 kg)  09/06/23 207 lb  3.2 oz (94 kg)    Physical Exam Vitals and nursing note reviewed.  Constitutional:      General: He is not in acute distress.    Appearance: Normal appearance.  Skin:    Findings: Erythema and lesion present.     Comments: Left groin abscess that has been open and packed.  Patient does have some induration and surrounding erythema but no streaking.  The packing was removed the patient tolerated procedure.  There was a foul-smelling odor from the packing.  Neurological:     General: No focal deficit present.     Mental Status: He is alert and oriented to person, place, and time.     Comments: Right hand tremor  Psychiatric:        Mood and Affect: Mood normal.        Behavior: Behavior normal.      No results found for any visits on 10/19/23.

## 2023-10-19 NOTE — Addendum Note (Signed)
 Addended by: Nilsa Bash on: 10/19/2023 10:21 AM   Modules accepted: Orders

## 2023-10-21 ENCOUNTER — Ambulatory Visit: Payer: Self-pay | Admitting: Diagnostic Neuroimaging

## 2023-10-23 NOTE — Telephone Encounter (Signed)
 Please advise patient that his recent DaTscan  did not support underlying Parkinson's disease or a parkinsonian syndrome.  At this juncture, he can follow-up with his PCP as scheduled/planned.

## 2023-10-24 ENCOUNTER — Other Ambulatory Visit: Payer: Self-pay | Admitting: Family Medicine

## 2023-10-24 ENCOUNTER — Ambulatory Visit: Admitting: Family Medicine

## 2023-10-24 DIAGNOSIS — I10 Essential (primary) hypertension: Secondary | ICD-10-CM

## 2023-10-26 NOTE — Telephone Encounter (Signed)
-----   Message from Omega Bible sent at 10/21/2023 10:40 AM EDT ----- Results are good. No evidence of parkinsonism on the scan. Follow up with Dr. Omar Bibber. -VRP ----- Message ----- From: Burns Carwin, RN Sent: 10/20/2023   3:51 PM EDT To: Omega Bible, MD  Datscan  negative, can you make comment specific for patient? Has had symptoms for about a year. Dr Omar Bibber will be back on Monday.

## 2023-10-26 NOTE — Telephone Encounter (Signed)
 Attempted to reach patient at two phone numbers on chart. VM full on one. LVM on the other number asking for call back. Included call back # and hours today.

## 2023-10-27 NOTE — Telephone Encounter (Signed)
 Tried to reach pt again to discuss DaTscan  results. A lady answered (no individual noted on DPR). Patient at store. We can try to call back later and he can also call us  back when he gets home.

## 2023-11-09 NOTE — Telephone Encounter (Signed)
 I was able to talk to the patient today. I informed him of his dat scan results which did not support underlying PD or Parkinsonian syndrome. Patient can follow-up with PCP. I sent the results to pt's provider. He was appreciative. He was encouraged to call back if any questions arise.

## 2023-11-15 ENCOUNTER — Ambulatory Visit: Admitting: Family Medicine

## 2023-11-15 ENCOUNTER — Encounter: Payer: Self-pay | Admitting: Family Medicine

## 2023-11-15 VITALS — BP 127/83 | HR 76 | Temp 98.4°F | Ht 70.0 in | Wt 201.6 lb

## 2023-11-15 DIAGNOSIS — Z79899 Other long term (current) drug therapy: Secondary | ICD-10-CM

## 2023-11-15 DIAGNOSIS — R251 Tremor, unspecified: Secondary | ICD-10-CM

## 2023-11-15 DIAGNOSIS — I10 Essential (primary) hypertension: Secondary | ICD-10-CM | POA: Diagnosis not present

## 2023-11-15 DIAGNOSIS — E782 Mixed hyperlipidemia: Secondary | ICD-10-CM

## 2023-11-15 DIAGNOSIS — Z1329 Encounter for screening for other suspected endocrine disorder: Secondary | ICD-10-CM

## 2023-11-15 DIAGNOSIS — L0291 Cutaneous abscess, unspecified: Secondary | ICD-10-CM

## 2023-11-15 DIAGNOSIS — F331 Major depressive disorder, recurrent, moderate: Secondary | ICD-10-CM

## 2023-11-15 DIAGNOSIS — E1165 Type 2 diabetes mellitus with hyperglycemia: Secondary | ICD-10-CM

## 2023-11-15 NOTE — Progress Notes (Signed)
 Subjective:  HPI: [name] [name] is a 58 y.o. male presenting on 11/15/2023 for Follow-up (From 10/19/2023/Review last brain CT scan results. )   HPI Patient is in today for follow up for left groin wound. He reports today the area is healed. No redness, warmth, tenderness, drainage. Completed Doxycycline  x2 and Cipro .   Tremor disorder: followed by Neurology and medications managed by Psychiatry, involuntary movements initally thought to be secondary to Wellbutrin  and Abilify , these were DC and he was started on Ingrezza  60mg  daily, Valprioc acid 250mg  BID, Cymbalta , and Clonazepam . Symptoms have been stable without relief. Denies caffeine use. Negative CTH, DaTscan .  Currently taking Depakote  500mg  BID, Duloxetine  40mg  daily, Hydroxyzine  50mg  QID PRN, Propanolol 10mg  TID, and Ingrezza  80mg  daily. Symptoms unchanged.   Review of Systems  All other systems reviewed and are negative.   Relevant past medical history reviewed and updated as indicated.   Past Medical History:  Diagnosis Date   Anxiety    Arthritis    Chest heaviness    Chills    Depression, major    Diabetes mellitus without complication (HCC)    Dizzy    Dyslipidemia    ED (erectile dysfunction)    Elevated CPK    GERD (gastroesophageal reflux disease)    HA (headache)    Hypertension    Hypertriglyceridemia    Insomnia    Kidney stone    Left-sided Bell's palsy    Morbid obesity (HCC)    Nausea    No appetite    Panniculitis    of right lower leg   Parkinson's disease (HCC)    PTSD (post-traumatic stress disorder)    Routine screening for STI (sexually transmitted infection) 01/05/2023   Weakness      Past Surgical History:  Procedure Laterality Date   NO PAST SURGERIES      Allergies and medications reviewed and updated.   Current Outpatient Medications:    aspirin  EC 81 MG tablet, Take 81 mg by mouth daily., Disp: , Rfl:    atorvastatin  (LIPITOR) 20 MG tablet, TAKE 1 TABLET BY MOUTH DAILY,  Disp: 30 tablet, Rfl: 1   Cholecalciferol (VITAMIN D3) 1000 units CAPS, Take 1,000 Units by mouth daily., Disp: , Rfl:    DULoxetine  HCl 40 MG CPEP, Take 1 capsule (40 mg total) by mouth daily., Disp: 30 capsule, Rfl: 3   glimepiride  (AMARYL ) 4 MG tablet, TAKE 1 TABLET (4 MG TOTAL) BY MOUTH 2 (TWO) TIMES DAILY., Disp: 180 tablet, Rfl: 1   hydrOXYzine  (ATARAX ) 50 MG tablet, TAKE 1 TABLET BY MOUTH FOUR TIMES A DAY & TAKE 2 TABLETS EVERY NIGHT AT BEDTIME FOR ANXIETY, PANIC, AGITATION, AND SLEEP, Disp: 120 tablet, Rfl: 3   lisinopril  (ZESTRIL ) 20 MG tablet, TAKE 1 TABLET BY MOUTH DAILY, Disp: 30 tablet, Rfl: 1   meloxicam  (MOBIC ) 7.5 MG tablet, Take 1 tablet (7.5 mg total) by mouth daily., Disp: 30 tablet, Rfl: 0   metFORMIN  (GLUCOPHAGE ) 500 MG tablet, TAKE 1 TABLET (500 MG TOTAL) BY MOUTH 2 (TWO) TIMES DAILY., Disp: 60 tablet, Rfl: 3   omeprazole (PRILOSEC OTC) 20 MG tablet, Take 20 mg by mouth daily as needed (for reflux)., Disp: , Rfl:    propranolol  (INDERAL ) 10 MG tablet, Take 1 tablet (10 mg total) by mouth 3 (three) times daily., Disp: 90 tablet, Rfl: 3   sildenafil  (REVATIO ) 20 MG tablet, Take 1 tablet (20 mg total) by mouth daily as needed (as directed for E.D.)., Disp: 10 tablet,  Rfl: 2   valbenazine  (INGREZZA ) 80 MG capsule, Take 1 capsule (80 mg total) by mouth daily., Disp: 30 capsule, Rfl: 3   divalproex  (DEPAKOTE ) 500 MG DR tablet, Take 1 tablet (500 mg total) by mouth 2 (two) times daily. (Patient not taking: Reported on 11/15/2023), Disp: 60 tablet, Rfl: 3  No Known Allergies  Objective:   BP 127/83   Pulse 76   Temp 98.4 F (36.9 C)   Ht 5' 10 (1.778 m)   Wt 201 lb 9.6 oz (91.4 kg)   SpO2 97%   BMI 28.93 kg/m      11/15/2023    2:25 PM 10/19/2023    9:42 AM 10/13/2023    9:13 AM  Vitals with BMI  Height 5' 10 5' 10 5' 10  Weight 201 lbs 10 oz 199 lbs 199 lbs 6 oz  BMI 28.93 28.55 28.61  Systolic 127 122 879  Diastolic 83 78 82  Pulse 76 64 63     Physical  Exam Vitals and nursing note reviewed.  Constitutional:      Appearance: Normal appearance. He is normal weight.  HENT:     Head: Normocephalic and atraumatic.  Skin:    General: Skin is warm and dry.     Capillary Refill: Capillary refill takes less than 2 seconds.     Comments: Well healed incision to left groin without redness, warmth, tenderness, or drainage  Neurological:     General: No focal deficit present.     Mental Status: He is alert and oriented to person, place, and time. Mental status is at baseline.     Motor: Tremor present.  Psychiatric:        Mood and Affect: Mood normal.        Behavior: Behavior normal.        Thought Content: Thought content normal.        Judgment: Judgment normal.     Assessment & Plan:  Abscess Assessment & Plan: Well healed incision with no complications   Type 2 diabetes mellitus with hyperglycemia, without long-term current use of insulin  (HCC) -     CBC with Differential/Platelet -     Comprehensive metabolic panel with GFR -     Lipid panel -     TSH -     Hemoglobin A1c -     Vitamin B12 -     VITAMIN D  25 Hydroxy (Vit-D Deficiency, Fractures) -     Magnesium   Mixed hyperlipidemia -     CBC with Differential/Platelet -     Comprehensive metabolic panel with GFR -     Lipid panel -     TSH -     Hemoglobin A1c -     Vitamin B12 -     VITAMIN D  25 Hydroxy (Vit-D Deficiency, Fractures) -     Magnesium   Essential hypertension -     CBC with Differential/Platelet -     Comprehensive metabolic panel with GFR -     Lipid panel -     TSH -     Hemoglobin A1c -     Vitamin B12 -     VITAMIN D  25 Hydroxy (Vit-D Deficiency, Fractures) -     Magnesium   Hypomagnesemia -     Magnesium   Moderate episode of recurrent major depressive disorder (HCC) -     VITAMIN D  25 Hydroxy (Vit-D Deficiency, Fractures)  Screening for thyroid  disorder -     TSH  Medication management -  Vitamin B12 -     VITAMIN D  25 Hydroxy  (Vit-D Deficiency, Fractures) -     Magnesium   Tremor Assessment & Plan: followed by Neurology and medications managed by Psychiatry Denies caffeine use.  Negative CTH, DaTscan .   Currently taking Depakote  500mg  BID, Duloxetine  40mg  daily, Hydroxyzine  50mg  QID PRN, Propanolol 10mg  TID, and Ingrezza  80mg  daily. Symptoms unchanged.  I have reached out to Psychiatry for plan of care discussion      Follow up plan: Return for annual physical with labs 1 week prior.  Jeoffrey GORMAN Barrio, [name]

## 2023-11-15 NOTE — Assessment & Plan Note (Signed)
 Well healed incision with no complications

## 2023-11-15 NOTE — Assessment & Plan Note (Addendum)
 followed by Neurology and medications managed by Psychiatry Denies caffeine use.  Negative CTH, DaTscan .   Currently taking Depakote  500mg  BID, Duloxetine  40mg  daily, Hydroxyzine  50mg  QID PRN, Propanolol 10mg  TID, and Ingrezza  80mg  daily. Symptoms unchanged.  I have reached out to Psychiatry for plan of care discussion

## 2023-11-24 ENCOUNTER — Other Ambulatory Visit (HOSPITAL_COMMUNITY): Payer: Self-pay | Admitting: Psychiatry

## 2023-11-24 ENCOUNTER — Other Ambulatory Visit: Payer: Self-pay | Admitting: Family Medicine

## 2023-11-24 DIAGNOSIS — E782 Mixed hyperlipidemia: Secondary | ICD-10-CM

## 2023-11-24 DIAGNOSIS — G2401 Drug induced subacute dyskinesia: Secondary | ICD-10-CM

## 2023-11-30 ENCOUNTER — Other Ambulatory Visit: Payer: Self-pay

## 2023-11-30 ENCOUNTER — Emergency Department (HOSPITAL_COMMUNITY)
Admission: EM | Admit: 2023-11-30 | Discharge: 2023-11-30 | Disposition: A | Discharge status: Home / Self Care | Attending: Emergency Medicine | Admitting: Emergency Medicine

## 2023-11-30 ENCOUNTER — Emergency Department (HOSPITAL_COMMUNITY)

## 2023-11-30 ENCOUNTER — Encounter (HOSPITAL_COMMUNITY): Payer: Self-pay

## 2023-11-30 DIAGNOSIS — R55 Syncope and collapse: Secondary | ICD-10-CM | POA: Insufficient documentation

## 2023-11-30 DIAGNOSIS — G20A1 Parkinson's disease without dyskinesia, without mention of fluctuations: Secondary | ICD-10-CM | POA: Insufficient documentation

## 2023-11-30 DIAGNOSIS — E119 Type 2 diabetes mellitus without complications: Secondary | ICD-10-CM | POA: Insufficient documentation

## 2023-11-30 DIAGNOSIS — W010XXA Fall on same level from slipping, tripping and stumbling without subsequent striking against object, initial encounter: Secondary | ICD-10-CM | POA: Insufficient documentation

## 2023-11-30 DIAGNOSIS — W19XXXA Unspecified fall, initial encounter: Secondary | ICD-10-CM

## 2023-11-30 DIAGNOSIS — S0011XA Contusion of right eyelid and periocular area, initial encounter: Secondary | ICD-10-CM | POA: Diagnosis not present

## 2023-11-30 DIAGNOSIS — I10 Essential (primary) hypertension: Secondary | ICD-10-CM | POA: Insufficient documentation

## 2023-11-30 DIAGNOSIS — S91111A Laceration without foreign body of right great toe without damage to nail, initial encounter: Secondary | ICD-10-CM | POA: Insufficient documentation

## 2023-11-30 DIAGNOSIS — S60811A Abrasion of right wrist, initial encounter: Secondary | ICD-10-CM | POA: Diagnosis not present

## 2023-11-30 DIAGNOSIS — Z79899 Other long term (current) drug therapy: Secondary | ICD-10-CM | POA: Insufficient documentation

## 2023-11-30 DIAGNOSIS — S99921A Unspecified injury of right foot, initial encounter: Secondary | ICD-10-CM | POA: Diagnosis present

## 2023-11-30 DIAGNOSIS — Z7982 Long term (current) use of aspirin: Secondary | ICD-10-CM | POA: Diagnosis not present

## 2023-11-30 LAB — CBG MONITORING, ED: Glucose-Capillary: 140 mg/dL — ABNORMAL HIGH (ref 70–99)

## 2023-11-30 LAB — CBC
HCT: 51.7 % (ref 39.0–52.0)
Hemoglobin: 17.3 g/dL — ABNORMAL HIGH (ref 13.0–17.0)
MCH: 28.4 pg (ref 26.0–34.0)
MCHC: 33.5 g/dL (ref 30.0–36.0)
MCV: 84.8 fL (ref 80.0–100.0)
Platelets: 266 K/uL (ref 150–400)
RBC: 6.1 MIL/uL — ABNORMAL HIGH (ref 4.22–5.81)
RDW: 14.7 % (ref 11.5–15.5)
WBC: 9.8 K/uL (ref 4.0–10.5)
nRBC: 0 % (ref 0.0–0.2)

## 2023-11-30 LAB — COMPREHENSIVE METABOLIC PANEL WITH GFR
ALT: 17 U/L (ref 0–44)
AST: 22 U/L (ref 15–41)
Albumin: 3.7 g/dL (ref 3.5–5.0)
Alkaline Phosphatase: 56 U/L (ref 38–126)
Anion gap: 12 (ref 5–15)
BUN: 17 mg/dL (ref 6–20)
CO2: 28 mmol/L (ref 22–32)
Calcium: 9.6 mg/dL (ref 8.9–10.3)
Chloride: 97 mmol/L — ABNORMAL LOW (ref 98–111)
Creatinine, Ser: 1.09 mg/dL (ref 0.61–1.24)
GFR, Estimated: >60 mL/min (ref >60)
Glucose, Bld: 112 mg/dL — ABNORMAL HIGH (ref 70–99)
Potassium: 4.3 mmol/L (ref 3.5–5.1)
Sodium: 137 mmol/L (ref 135–145)
Total Bilirubin: 0.7 mg/dL (ref 0.0–1.2)
Total Protein: 7.1 g/dL (ref 6.5–8.1)

## 2023-11-30 LAB — MAGNESIUM: Magnesium: 1.6 mg/dL — ABNORMAL LOW (ref 1.7–2.4)

## 2023-11-30 MED ORDER — VALBENAZINE TOSYLATE 40 MG PO CAPS
80.0000 mg | ORAL_CAPSULE | Freq: Once | ORAL | Status: AC
  Administered 2023-11-30: 80 mg via ORAL
  Filled 2023-11-30: qty 2

## 2023-11-30 MED ORDER — HYDROCODONE-ACETAMINOPHEN 5-325 MG PO TABS
1.0000 | ORAL_TABLET | Freq: Once | ORAL | Status: AC
  Administered 2023-11-30: 1 via ORAL
  Filled 2023-11-30: qty 1

## 2023-11-30 MED ORDER — MAGNESIUM OXIDE -MG SUPPLEMENT 400 (240 MG) MG PO TABS
400.0000 mg | ORAL_TABLET | Freq: Once | ORAL | Status: AC
  Administered 2023-11-30: 400 mg via ORAL
  Filled 2023-11-30: qty 1

## 2023-11-30 NOTE — Discharge Instructions (Addendum)
 Evaluation of your fall was overall reassuring.  Please follow-up your PCP.  If your symptoms worsen in any way please return to the ED for further evaluation.

## 2023-11-30 NOTE — ED Triage Notes (Addendum)
 EMS called for a fall. Patient states he tripped, unknown if he passed out. Patient hit his head, no thinners.   Patient states he was getting up from the couch and all of sudden he fell. He tried to brace himself but landed face down in the floor, hardwoods. Complains of pain in the right greater toe and in his face around his right eye. Denies headache or dizziness. Patient has had at least 5 falls in the  last 30 days.   HX DM, HTN, tremors.

## 2023-11-30 NOTE — ED Notes (Signed)
 Assited pt to br, tried to obtain UA, unsucessful

## 2023-11-30 NOTE — ED Provider Notes (Signed)
 [facility] Provider Note   CSN: [id number] Arrival date & time: 11/30/23  0045     Patient presents with: Fall and Near Syncope  HPI [name] [name] is a 58 y.o. male with diabetes, Parkinson's disease, hypertension, Bipolar I presenting for fall. He was occurred last night. He states he stood up from the couch and started to trip and then fluid started to catch himself but then ended up falling forward.  States he hit his face and his right great toe is hurting.  He denies LOC.  He denies preceding chest pain, palpitations or shortness of breath.  He is confident that this was a mechanical fall.  Denies headache or dizziness.  He did mention that he is fallen about 5 times in the last 30 days.    Fall  Near Syncope       Prior to Admission medications   Medication Sig Start Date End Date Taking? Authorizing Provider  aspirin  EC 81 MG tablet Take 81 mg by mouth daily.    [provider]  atorvastatin  (LIPITOR) 20 MG tablet TAKE 1 TABLET BY MOUTH DAILY 11/28/23   Howard, Amber S, [name]  Cholecalciferol (VITAMIN D3) 1000 units CAPS Take 1,000 Units by mouth daily.    [provider]  divalproex  (DEPAKOTE ) 500 MG DR tablet Take 1 tablet (500 mg total) by mouth 2 (two) times daily. Patient not taking: Reported on 11/15/2023 10/10/23   Harl Zane BRAVO, NP  DULoxetine  HCl 40 MG CPEP Take 1 capsule (40 mg total) by mouth daily. 10/10/23   Harl Zane BRAVO, NP  glimepiride  (AMARYL ) 4 MG tablet TAKE 1 TABLET (4 MG TOTAL) BY MOUTH 2 (TWO) TIMES DAILY. 08/22/23   [name], [name]  hydrOXYzine  (ATARAX ) 50 MG tablet TAKE 1 TABLET BY MOUTH FOUR TIMES A DAY & TAKE 2 TABLETS EVERY NIGHT AT BEDTIME FOR ANXIETY, PANIC, AGITATION, AND SLEEP 10/10/23   Harl Zane E, NP  lisinopril  (ZESTRIL ) 20 MG tablet TAKE 1 TABLET BY MOUTH DAILY 10/24/23   [name], [name]  meloxicam  (MOBIC ) 7.5 MG tablet Take 1 tablet (7.5 mg total) by mouth  daily. 10/13/22   [name], [name]  metFORMIN  (GLUCOPHAGE ) 500 MG tablet TAKE 1 TABLET (500 MG TOTAL) BY MOUTH 2 (TWO) TIMES DAILY. 08/22/23   [name], [name]  omeprazole (PRILOSEC OTC) 20 MG tablet Take 20 mg by mouth daily as needed (for reflux).    [provider]  propranolol  (INDERAL ) 10 MG tablet Take 1 tablet (10 mg total) by mouth 3 (three) times daily. 10/10/23   Harl Zane BRAVO, NP  sildenafil  (REVATIO ) 20 MG tablet Take 1 tablet (20 mg total) by mouth daily as needed (as directed for E.D.). 02/09/23   Duanne Butler DASEN, MD  valbenazine  (INGREZZA ) 80 MG capsule Take 1 capsule (80 mg total) by mouth daily. 10/10/23   Harl Zane BRAVO, NP    Allergies: Patient has no known allergies.    Review of Systems  Cardiovascular:  Positive for near-syncope.    Updated Vital Signs BP 112/85 (BP Location: Right Arm)   Pulse 86   Temp 98.1 F (36.7 C) (Oral)   Resp 16   Ht 5' 10 (1.778 m)   Wt 90.7 kg   SpO2 97%   BMI 28.70 kg/m   Physical Exam Vitals and nursing note reviewed.  HENT:     Head: Normocephalic and atraumatic. No raccoon eyes or Battle's sign.  Mouth/Throat:     Mouth: Mucous membranes are moist.  Eyes:     General: Lids are normal.        Right eye: No discharge.        Left eye: No discharge.     Extraocular Movements: Extraocular movements intact.     Conjunctiva/sclera: Conjunctivae normal.  Cardiovascular:     Rate and Rhythm: Normal rate and regular rhythm.     Pulses: Normal pulses.          Radial pulses are 2+ on the right side and 2+ on the left side.       Dorsalis pedis pulses are 2+ on the right side and 2+ on the left side.     Heart sounds: Normal heart sounds.  Pulmonary:     Effort: Pulmonary effort is normal.     Breath sounds: Normal breath sounds.  Abdominal:     General: Abdomen is flat.     Palpations: Abdomen is soft.  Musculoskeletal:       Feet:     Comments: Small abrasion noted overlying the right wrist.   No obvious deformity.  Skin:    General: Skin is warm and dry.  Neurological:     General: No focal deficit present.  Psychiatric:        Mood and Affect: Mood normal.     (all labs ordered are listed, but only abnormal results are displayed) Labs Reviewed  COMPREHENSIVE METABOLIC PANEL WITH GFR - Abnormal; Notable for the following components:      Result Value   Chloride 97 (*)    Glucose, Bld 112 (*)    All other components within normal limits  CBC - Abnormal; Notable for the following components:   RBC 6.10 (*)    Hemoglobin 17.3 (*)    All other components within normal limits  MAGNESIUM  - Abnormal; Notable for the following components:   Magnesium  1.6 (*)    All other components within normal limits  CBG MONITORING, ED - Abnormal; Notable for the following components:   Glucose-Capillary 140 (*)    All other components within normal limits  URINALYSIS, ROUTINE W REFLEX MICROSCOPIC    EKG: EKG Interpretation Date/Time:  Wednesday November 30 2023 00:53:00 EDT Ventricular Rate:  63 PR Interval:  172 QRS Duration:  92 QT Interval:  438 QTC Calculation: 448 R Axis:   58  Text Interpretation: Normal sinus rhythm Confirmed by Melvenia Motto [phone]) on 11/30/2023 5:38:40 AM  Radiology: [facility] Elbow Complete Right Result Date: 11/30/2023 EXAM: 3 VIEW(S) XRAY OF THE RIGHT ELBOW COMPARISON: None available. CLINICAL HISTORY: Pain. Encounter for pain, tremors, near syncope. FINDINGS: BONES AND JOINTS: No acute fracture. No focal osseous lesion. No joint dislocation. SOFT TISSUES: The soft tissues are unremarkable. IMPRESSION: 1. No acute abnormality. Electronically signed by: Pinkie Pebbles MD 11/30/2023 02:08 AM EDT RP Workstation: [id number]   DG Wrist Complete Right Result Date: 11/30/2023 EXAM: 3 or more VIEW(S) XRAY OF THE RIGHT WRIST 11/30/2023 01:54:00 AM COMPARISON: None available. CLINICAL HISTORY: Pain. Encounter for pain, tremors and near syncope. FINDINGS: BONES AND JOINTS:  No acute fracture. No focal osseous lesion. No joint dislocation. SOFT TISSUES: The soft tissues are unremarkable. IMPRESSION: 1. No significant abnormality. Electronically signed by: Pinkie Pebbles MD 11/30/2023 02:08 AM EDT RP Workstation: [id number]   CT Cervical Spine Wo Contrast Result Date: 11/30/2023 EXAM: CT CERVICAL SPINE WITHOUT CONTRAST 11/30/2023 01:56:28 AM TECHNIQUE: CT of the cervical spine was performed without the administration of  intravenous contrast. Multiplanar reformatted images are provided for review. Automated exposure control, iterative reconstruction, and/or weight based adjustment of the mA/kV was utilized to reduce the radiation dose to as low as reasonably achievable. COMPARISON: None available. CLINICAL HISTORY: Neck trauma, dangerous injury mechanism (Age 39-64y). fall FINDINGS: CERVICAL SPINE: BONES AND ALIGNMENT: No acute fracture or traumatic malalignment. DEGENERATIVE CHANGES: Mild degenerative changes at C6-7. SOFT TISSUES: No prevertebral soft tissue swelling. IMPRESSION: 1. No traumatic injury to the cervical spine. Electronically signed by: Pinkie Pebbles MD 11/30/2023 02:07 AM EDT RP Workstation: [id number]   CT Maxillofacial Wo Contrast Result Date: 11/30/2023 EXAM: CT OF THE FACE WITHOUT CONTRAST 11/30/2023 01:56:28 AM TECHNIQUE: CT of the face was performed without the administration of intravenous contrast. Multiplanar reformatted images are provided for review. Automated exposure control, iterative reconstruction, and/or weight based adjustment of the mA/kV was utilized to reduce the radiation dose to as low as reasonably achievable. COMPARISON: None available. CLINICAL HISTORY: Facial trauma, blunt. fall FINDINGS: FACIAL BONES: No acute facial fracture. No mandibular dislocation. No suspicious bone lesion. ORBITS: Globes are intact. No acute traumatic injury. No inflammatory change. SINUSES AND MASTOIDS: No acute abnormality. SOFT TISSUES: No acute  abnormality. IMPRESSION: 1. No acute facial fracture. Electronically signed by: Pinkie Pebbles MD 11/30/2023 02:07 AM EDT RP Workstation: [id number]   CT Head Wo Contrast Result Date: 11/30/2023 EXAM: CT HEAD WITHOUT CONTRAST 11/30/2023 01:56:28 AM TECHNIQUE: CT of the head was performed without the administration of intravenous contrast. Automated exposure control, iterative reconstruction, and/or weight based adjustment of the mA/kV was utilized to reduce the radiation dose to as low as reasonably achievable. COMPARISON: 10/13/2016 CLINICAL HISTORY: Head trauma, moderate-severe. fall FINDINGS: BRAIN AND VENTRICLES: No acute hemorrhage. Gray-white differentiation is preserved. No hydrocephalus. No extra-axial collection. No mass effect or midline shift. Mild subcortical and periventricular small vessel ischemic changes. ORBITS: No acute abnormality. SINUSES: No acute abnormality. SOFT TISSUES AND SKULL: No acute soft tissue abnormality. No skull fracture. IMPRESSION: 1. No acute intracranial abnormality. 2. Mild small vessel ischemic changes. Electronically signed by: Pinkie Pebbles MD 11/30/2023 02:06 AM EDT RP Workstation: [id number]   DG Foot Complete Right Result Date: 11/30/2023 EXAM: 3 VIEW(S) XRAY OF THE RIGHT FOOT 11/30/2023 01:52:00 AM COMPARISON: None available. CLINICAL HISTORY: 855384 Pain 144615. Pt in pain from fall, tremors, near syncope. FINDINGS: BONES AND JOINTS: No acute fracture. No focal osseous lesion. No joint dislocation. SOFT TISSUES: The soft tissues are unremarkable. IMPRESSION: 1. No significant abnormality. Electronically signed by: Pinkie Pebbles MD 11/30/2023 02:02 AM EDT RP Workstation: [id number]     Procedures   Medications Ordered in the ED  valbenazine  (INGREZZA ) capsule 80 mg (80 mg Oral Given 11/30/23 0816)  magnesium  oxide (MAG-OX) tablet 400 mg (400 mg Oral Given 11/30/23 0816)  HYDROcodone -acetaminophen  (NORCO/VICODIN) 5-325 MG per tablet 1 tablet (1  tablet Oral Given 11/30/23 0816)                                    Medical Decision Making Amount and/or Complexity of Data Reviewed Labs: ordered. Radiology: ordered.  Risk OTC drugs. Prescription drug management.   Initial Impression and Ddx 58 year old well-appearing male presenting for mechanical fall.  Exam notable for wound over the right great toe, abrasion to the right wrist and bruising under the right eye in the upper cheek.  DDx includes skull fracture, traumatic ICH, traumatic eye injury, fracture dislocation of the wrist or toe,  arrhythmia, seizure, PE, other. Patient PMH that increases complexity of ED encounter: diabetes, Parkinson's disease, hypertension, Bipolar I  Interpretation of Diagnostics - I independent reviewed and interpreted the labs as followed: Hypomagnesia  - I independently visualized the following imaging with scope of interpretation limited to determining acute life threatening conditions related to emergency care: , xrays and CT scans which revealed no acute abnormality  - I personally reviewed interpret EKG which revealed normal sinus rhythm  Patient Reassessment and Ultimate Disposition/Management On reassessment, remained well-appearing and ambulated down the hall with no issue.  He was still complaining of toe pain but otherwise had no other complaints.  Applied buddy tape to his toe.  We also discussed with him to follow-up with his PCP may need home health in the future as he has had multiple falls.  He states at this time he is not interested in that service but will will discuss it with his PCP.  Workup overall reassuring.  Discussed return precautions.  Do feel that this was a mechanical fall.  Discharged in good condition.  Patient management required discussion with the following services or consulting groups:  None  Complexity of Problems Addressed Acute complicated illness or Injury  Additional Data Reviewed and Analyzed Further  history obtained from: Past medical history and medications listed in the EMR and Prior ED visit notes  Patient Encounter Risk Assessment Consideration of hospitalization      Final diagnoses:  Fall, initial encounter    ED Discharge Orders     None          Lang Norleen POUR, PA-C 11/30/23 [id number]    Ruthe Cornet, DO 11/30/23 [id number]

## 2023-12-01 ENCOUNTER — Other Ambulatory Visit

## 2023-12-05 ENCOUNTER — Encounter: Payer: Self-pay | Admitting: Family Medicine

## 2023-12-05 ENCOUNTER — Ambulatory Visit: Admitting: Family Medicine

## 2023-12-05 ENCOUNTER — Telehealth: Payer: Self-pay

## 2023-12-05 ENCOUNTER — Ambulatory Visit: Payer: Self-pay

## 2023-12-05 VITALS — BP 154/102 | HR 87 | Temp 99.3°F | Ht 70.0 in | Wt 201.4 lb

## 2023-12-05 DIAGNOSIS — J029 Acute pharyngitis, unspecified: Secondary | ICD-10-CM

## 2023-12-05 DIAGNOSIS — J069 Acute upper respiratory infection, unspecified: Secondary | ICD-10-CM

## 2023-12-05 DIAGNOSIS — R509 Fever, unspecified: Secondary | ICD-10-CM | POA: Diagnosis not present

## 2023-12-05 LAB — INFLUENZA A AND B AG, IMMUNOASSAY
INFLUENZA A ANTIGEN: NOT DETECTED
INFLUENZA B ANTIGEN: NOT DETECTED

## 2023-12-05 NOTE — Telephone Encounter (Signed)
 FYI Only or Action Required?: FYI only for provider.  Patient was last seen in primary care on 11/15/2023 by [name], [name].  Called Nurse Triage reporting Fever.  Symptoms began several days ago.  Interventions attempted: OTC medications: tylenol .  Symptoms are: unchanged.  Triage Disposition: See HCP Within 4 Hours (Or PCP Triage)  Patient/caregiver understands and will follow disposition?: Yes      Copied from CRM #[id number]. Topic: Clinical - Red Word Triage >> Dec 05, 2023  9:19 AM Adelita E wrote: [facility] that prompted transfer to Nurse Triage: Worsening head cold. Going on for 4 days, scratchy/itchy throat along with a fever (patient did not have exact reading to give). Reason for Disposition  [1] Fever > 100 F (37.8 C) AND [2] diabetes mellitus or weak immune system (e.g., HIV positive, cancer chemo, splenectomy, organ transplant, chronic steroids)  Answer Assessment - Initial Assessment Questions 1. TEMPERATURE: What is the most recent temperature?  How was it measured?      100 2. ONSET: When did the fever start?      Four days ago 3. CHILLS: Do you have chills? If yes: How bad are they?  (e.g., none, mild, moderate, severe)     denies 4. OTHER SYMPTOMS: Do you have any other symptoms besides the fever?  (e.g., abdomen pain, cough, diarrhea, earache, headache, sore throat, urination pain)     cough 5. CAUSE: If there are no symptoms, ask: What do you think is causing the fever?     no 6. CONTACTS: Does anyone else in the family have an infection?     no 7. TREATMENT: What have you done so far to treat this fever? (e.g., OTC fever medicines)     Yes, tylenol ,  8. IMMUNOCOMPROMISE: Do you have any of the following: diabetes, HIV positive, splenectomy, cancer chemotherapy, chronic steroid treatment, transplant patient, etc.?     DM 9. PREGNANCY: Is there any chance you are pregnant? When was your last menstrual period?     na 10.  TRAVEL: Have you traveled out of the country in the last month? (e.g., travel history, exposures)       no  Protocols used: [facility]

## 2023-12-05 NOTE — Telephone Encounter (Signed)
 Called oth phone numbers on chart and left message to phone to call office back to go over test results. Office number was left for patient to return call.   [name] [name], CMA

## 2023-12-05 NOTE — Progress Notes (Signed)
 Subjective:  HPI: [name] [name] is a 58 y.o. male presenting on 12/05/2023 for Fever (Started 4 days ago. No other symptoms. Hasn't tried covid test.States he is trying ibuprofen for fever and it is not helping. Alkhaseltzer states helps him sleep. States sore throat. )   Fever    Patient is in today for 4 days fever, sore throat and runny nose with intermittent, mild cough Denies chills, body aches, fatigue, SOB, wheezing, pleurisy, nausea, vomiting, diarrhea, constipation, ear ache, sinus pain or pressure No known sick exposures however he was in hospital last week for a fall No home covid or flu test Has tried Ibuprofen and Alka Seltzer  Review of Systems  Constitutional:  Positive for fever.  All other systems reviewed and are negative.   Relevant past medical history reviewed and updated as indicated.   Past Medical History:  Diagnosis Date   Anxiety    Arthritis    Chest heaviness    Chills    Depression, major    Diabetes mellitus without complication (HCC)    Dizzy    Dyslipidemia    ED (erectile dysfunction)    Elevated CPK    GERD (gastroesophageal reflux disease)    HA (headache)    Hypertension    Hypertriglyceridemia    Insomnia    Kidney stone    Left-sided Bell's palsy    Morbid obesity (HCC)    Nausea    No appetite    Panniculitis    of right lower leg   Parkinson's disease (HCC)    PTSD (post-traumatic stress disorder)    Routine screening for STI (sexually transmitted infection) 01/05/2023   Weakness      Past Surgical History:  Procedure Laterality Date   NO PAST SURGERIES      Allergies and medications reviewed and updated.   Current Outpatient Medications:    aspirin  EC 81 MG tablet, Take 81 mg by mouth daily., Disp: , Rfl:    atorvastatin  (LIPITOR) 20 MG tablet, TAKE 1 TABLET BY MOUTH DAILY, Disp: 30 tablet, Rfl: 1   Cholecalciferol (VITAMIN D3) 1000 units CAPS, Take 1,000 Units by mouth daily., Disp: , Rfl:    DULoxetine  HCl  40 MG CPEP, Take 1 capsule (40 mg total) by mouth daily., Disp: 30 capsule, Rfl: 3   glimepiride  (AMARYL ) 4 MG tablet, TAKE 1 TABLET (4 MG TOTAL) BY MOUTH 2 (TWO) TIMES DAILY., Disp: 180 tablet, Rfl: 1   hydrOXYzine  (ATARAX ) 50 MG tablet, TAKE 1 TABLET BY MOUTH FOUR TIMES A DAY & TAKE 2 TABLETS EVERY NIGHT AT BEDTIME FOR ANXIETY, PANIC, AGITATION, AND SLEEP, Disp: 120 tablet, Rfl: 3   lisinopril  (ZESTRIL ) 20 MG tablet, TAKE 1 TABLET BY MOUTH DAILY, Disp: 30 tablet, Rfl: 1   meloxicam  (MOBIC ) 7.5 MG tablet, Take 1 tablet (7.5 mg total) by mouth daily., Disp: 30 tablet, Rfl: 0   metFORMIN  (GLUCOPHAGE ) 500 MG tablet, TAKE 1 TABLET (500 MG TOTAL) BY MOUTH 2 (TWO) TIMES DAILY., Disp: 60 tablet, Rfl: 3   omeprazole (PRILOSEC OTC) 20 MG tablet, Take 20 mg by mouth daily as needed (for reflux)., Disp: , Rfl:    propranolol  (INDERAL ) 10 MG tablet, Take 1 tablet (10 mg total) by mouth 3 (three) times daily., Disp: 90 tablet, Rfl: 3   sildenafil  (REVATIO ) 20 MG tablet, Take 1 tablet (20 mg total) by mouth daily as needed (as directed for E.D.)., Disp: 10 tablet, Rfl: 2   valbenazine  (INGREZZA ) 80 MG capsule, Take 1  capsule (80 mg total) by mouth daily., Disp: 30 capsule, Rfl: 3   divalproex  (DEPAKOTE ) 500 MG DR tablet, Take 1 tablet (500 mg total) by mouth 2 (two) times daily. (Patient not taking: Reported on 12/05/2023), Disp: 60 tablet, Rfl: 3  No Known Allergies  Objective:   BP (!) 154/102   Pulse 87   Temp 99.3 F (37.4 C) (Oral)   Ht 5' 10 (1.778 m)   Wt 201 lb 6.4 oz (91.4 kg)   SpO2 93%   BMI 28.90 kg/m      12/05/2023   11:49 AM 11/30/2023    9:46 AM 11/30/2023    6:14 AM  Vitals with BMI  Height 5' 10    Weight 201 lbs 6 oz    BMI 28.9    Systolic 154 115 887  Diastolic 102 77 85  Pulse 87 81 86     Physical Exam Vitals and nursing note reviewed.  Constitutional:      Appearance: Normal appearance. He is normal weight.  HENT:     Head: Normocephalic and atraumatic.      Right Ear: Tympanic membrane, ear canal and external ear normal.     Left Ear: Tympanic membrane, ear canal and external ear normal.     Nose: Nose normal.     Right Sinus: No maxillary sinus tenderness or frontal sinus tenderness.     Left Sinus: No maxillary sinus tenderness or frontal sinus tenderness.     Mouth/Throat:     Mouth: Mucous membranes are moist.     Pharynx: Oropharynx is clear.  Eyes:     Extraocular Movements: Extraocular movements intact.     Conjunctiva/sclera: Conjunctivae normal.     Pupils: Pupils are equal, round, and reactive to light.  Cardiovascular:     Rate and Rhythm: Normal rate and regular rhythm.     Pulses: Normal pulses.     Heart sounds: Normal heart sounds.  Pulmonary:     Effort: Pulmonary effort is normal.     Breath sounds: Normal breath sounds.  Musculoskeletal:     Cervical back: No tenderness.  Lymphadenopathy:     Cervical: No cervical adenopathy.  Skin:    General: Skin is warm and dry.     Capillary Refill: Capillary refill takes less than 2 seconds.  Neurological:     General: No focal deficit present.     Mental Status: He is alert and oriented to person, place, and time. Mental status is at baseline.  Psychiatric:        Mood and Affect: Mood normal.        Behavior: Behavior normal.        Thought Content: Thought content normal.        Judgment: Judgment normal.     Assessment & Plan:  Viral URI Assessment & Plan: Reassured patient that symptoms and exam findings are most consistent with a viral upper respiratory infection and explained lack of efficacy of antibiotics against viruses.  Discussed expected course and features suggestive of secondary bacterial infection.  Continue supportive care. Increase fluid intake with water or electrolyte solution like pedialyte. Encouraged acetaminophen  as needed for fever/pain. Encouraged salt water gargling, chloraseptic spray and throat lozenges. Encouraged OTC guaifenesin. Encouraged  saline sinus flushes and/or neti with humidified air.     Sore throat -     STREP GROUP A AG, W/REFLEX TO CULT  Fever, unspecified fever cause -     Influenza A and B Ag, Immunoassay  Follow up plan: Return if symptoms worsen or fail to improve.  Jeoffrey GORMAN Barrio, [name]

## 2023-12-05 NOTE — Assessment & Plan Note (Signed)

## 2023-12-07 LAB — CULTURE, GROUP A STREP
Micro Number: 16753074
SPECIMEN QUALITY:: ADEQUATE

## 2023-12-07 LAB — STREP GROUP A AG, W/REFLEX TO CULT: Streptococcus Group A AG: NOT DETECTED

## 2023-12-08 ENCOUNTER — Ambulatory Visit (INDEPENDENT_AMBULATORY_CARE_PROVIDER_SITE_OTHER): Admitting: Family Medicine

## 2023-12-08 ENCOUNTER — Encounter: Payer: Self-pay | Admitting: Family Medicine

## 2023-12-08 VITALS — BP 140/90 | HR 96 | Temp 98.1°F | Ht 70.0 in | Wt 198.0 lb

## 2023-12-08 DIAGNOSIS — Z0001 Encounter for general adult medical examination with abnormal findings: Secondary | ICD-10-CM

## 2023-12-08 DIAGNOSIS — E1165 Type 2 diabetes mellitus with hyperglycemia: Secondary | ICD-10-CM

## 2023-12-08 DIAGNOSIS — Z0000 Encounter for general adult medical examination without abnormal findings: Secondary | ICD-10-CM | POA: Insufficient documentation

## 2023-12-08 DIAGNOSIS — E782 Mixed hyperlipidemia: Secondary | ICD-10-CM | POA: Diagnosis not present

## 2023-12-08 DIAGNOSIS — Z7984 Long term (current) use of oral hypoglycemic drugs: Secondary | ICD-10-CM

## 2023-12-08 DIAGNOSIS — R251 Tremor, unspecified: Secondary | ICD-10-CM

## 2023-12-08 DIAGNOSIS — I10 Essential (primary) hypertension: Secondary | ICD-10-CM | POA: Diagnosis not present

## 2023-12-08 DIAGNOSIS — Z125 Encounter for screening for malignant neoplasm of prostate: Secondary | ICD-10-CM

## 2023-12-08 DIAGNOSIS — Z1211 Encounter for screening for malignant neoplasm of colon: Secondary | ICD-10-CM

## 2023-12-08 NOTE — Assessment & Plan Note (Signed)
 followed by Neurology and medications managed by Psychiatry Denies caffeine use.  Negative CTH, DaTscan .   Currently taking Depakote  500mg  BID, Duloxetine  40mg  daily, Hydroxyzine  50mg  QID PRN, Propanolol 10mg  TID, and Ingrezza  80mg  daily. Symptoms unchanged.   Follow up with Neuro, if signed off will work with Psychiatry for medication optimization starting with increasing Propanolol.

## 2023-12-08 NOTE — Assessment & Plan Note (Signed)

## 2023-12-08 NOTE — Assessment & Plan Note (Signed)
 Continue Atorvastatin  20mg  daily. abs today.  I recommend consuming a heart healthy diet such as Mediterranean diet or DASH diet with whole grains, fruits, vegetable, fish, lean meats, nuts, and olive oil. Limit sweets and processed foods. I also encourage moderate intensity exercise 150 minutes weekly. This is 3-5 times weekly for 30-50 minutes each session. Goal should be pace of 3 miles/hours, or walking 1.5 miles in 30 minutes. The 10-year ASCVD risk score (Arnett DK, et al., 2019) is: 16.8%

## 2023-12-08 NOTE — Assessment & Plan Note (Signed)
 Continue Metformin  and Amaryl . A1c and uACR today. Foot exam today. Vaccines due. Retinal eye exam UTD. Recommend heart healthy diet such as Mediterranean diet with whole grains, fruits, vegetable, fish, lean meats, nuts, and olive oil. Limit salt. Encouraged moderate walking, 3-5 times/week for 30-50 minutes each session. Aim for at least 150 minutes.week. Goal should be pace of 3 miles/hours, or walking 1.5 miles in 30 minutes. Seek medical care for urinary frequency, extreme thirst, vision changes, lightheadedness, dizziness.  Follow up in 6 months or sooner if needed based on labs

## 2023-12-08 NOTE — Progress Notes (Signed)
 Complete physical exam  Patient: [name] [name]   DOB: 06-Mar-1966   58 y.o. Male  MRN: [id number]  Subjective:    Chief Complaint  Patient presents with   Annual Exam    Stills feels like he has a head cold. Ears feels like they are roaring. Cough at night keeping him up w/ chills.  Referral for colonoscopy.  Foot exam today     [name] [name] is a 58 y.o. male who presents today for a complete physical exam. He reports consuming a general diet. The patient does not participate in regular exercise at present. He generally feels well. He reports sleeping well. He does not have additional problems to discuss today. Viral symptoms improving, today he denies SOB, wheezing, pleurisy, sinus pain or pressure, sore throat, ear pain, fever, body aches, dysuria, abdominal pain, nausea, vomiting, diarrhea, or constipation.   DM2: metformin  500mg  BID, Amaryl  4mg  BID, heart healthy low carb diet, exercise limited, foot exam today, uACR today  HTN: Lisinopril  20mg  daily, not checking at home  HLD: atorvastatin  20mg  daily  Bipolar 1 & 2: sees Psychiatry [name] [name], on Depakote , Duloxetine , Hydroxyzine , Propanolol, Ingrezza    DIABETES Hypoglycemic episodes:no Polydipsia/polyuria: no Visual disturbance: no Chest pain: no Paresthesias: no Glucose Monitoring: yes  Accucheck frequency: Daily  Fasting glucose: 87-120  Post prandial:  Evening:  Before meals: Taking Insulin ?: no  Long acting insulin :  Short acting insulin : Blood Pressure Monitoring: not checking Retinal Examination: Up to Date Foot Exam: Not up to Date Diabetic Education: Completed Pneumovax: Not up to Date Influenza: Up to Date Aspirin : no  Tremor: sudden onset 10/2022 after Abilify  and Wellbutrin  use, mild to moderate resting in BUE and BLE, presumed medication related however not improved after being off of those medications for over a year now, not improved with Clonazepam  and Ingrezza , DaTscan  negative  A&P  Neurology Dr [name] [name] 09/06/2023 for reference only: In summary, [name] [name] is a very pleasant 58 y.o.-year old male with an underlying complex medical history of diabetes, arthritis, dizziness, hyperlipidemia, kidney stones, reflux disease, headaches, history of left-sided Bell's palsy, obesity, depression, PTSD, tardive dyskinesia, anxiety, and bipolar disorder, who presents for evaluation of his involuntary movements including tremors, parkinsonism, and tardive dyskinesia noted in the past for which he was started on Ingrezza .  History and examination are not telltale for idiopathic Parkinson's disease due to lack of obvious lateralization primarily.  He has been off of Abilify  for nearly a year from what I can see.  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  I would like to proceed with a DaTscan  at this time which may help us  determine if he has primary parkinsonism or secondary parkinsonism.     Most recent fall risk assessment:    10/13/2023    9:17 AM  Fall Risk   Falls in the past year? 1  Number falls in past yr: 1  Injury with Fall? 0  Risk for fall due to : Impaired balance/gait  Follow up Falls evaluation completed     Most recent depression screenings:    10/13/2023    9:17 AM 10/10/2023   12:22 PM  PHQ 2/9 Scores  PHQ - 2 Score 6 2  PHQ- 9 Score 20 15    Vision:Within last year, Dental: No current dental problems and No regular dental care , and PSA: Agrees to PSA testing  Patient Active Problem List   Diagnosis Date Noted   Physical exam, annual 12/08/2023   Herpes  simplex antibody positive 01/13/2023   Type 2 diabetes mellitus with hyperglycemia, without long-term current use of insulin  (HCC) 09/01/2022   Essential hypertension 09/01/2022   Mixed hyperlipidemia 09/01/2022   Tremor 09/01/2022   Bipolar I disorder, most recent episode depressed (HCC) 10/21/2021   Generalized anxiety disorder 01/23/2020   Moderate episode of recurrent major depressive disorder  (HCC) 01/23/2020   Primary osteoarthritis of right knee 10/31/2019   MDD (major depressive disorder) 10/24/2019   PTSD (post-traumatic stress disorder) 10/24/2019   Past Medical History:  Diagnosis Date   Anxiety    Arthritis    Chest heaviness    Chills    Depression, major    Diabetes mellitus without complication (HCC)    Dizzy    Dyslipidemia    ED (erectile dysfunction)    Elevated CPK    GERD (gastroesophageal reflux disease)    HA (headache)    Hypertension    Hypertriglyceridemia    Insomnia    Kidney stone    Left-sided Bell's palsy    Morbid obesity (HCC)    Nausea    No appetite    Panniculitis    of right lower leg   Parkinson's disease (HCC)    PTSD (post-traumatic stress disorder)    Routine screening for STI (sexually transmitted infection) 01/05/2023   Weakness    Past Surgical History:  Procedure Laterality Date   NO PAST SURGERIES     Social History   Tobacco Use   Smoking status: Never   Smokeless tobacco: Never  Vaping Use   Vaping status: Never Used  Substance Use Topics   Alcohol use: No   Drug use: Never   Family History  Problem Relation Age of Onset   Thyroid  disease Brother    Depression Brother    Heart disease Mother    Anxiety disorder Mother    Kidney failure Father    Diabetes Father    Anxiety disorder Father    Thyroid  disease Sister    Mental illness Sister    Heart attack Maternal Grandfather    Heart failure Paternal Grandmother    Cancer Paternal Grandfather        bone   No Known Allergies    Patient Care Team: [name] Jeoffrey [name], [name] as PCP - General (Family Medicine) [name] Maude BROCKS, MD as PCP - Cardiology (Cardiology)   Outpatient Medications Prior to Visit  Medication Sig   aspirin  EC 81 MG tablet Take 81 mg by mouth daily.   atorvastatin  (LIPITOR) 20 MG tablet TAKE 1 TABLET BY MOUTH DAILY   Cholecalciferol (VITAMIN D3) 1000 units CAPS Take 1,000 Units by mouth daily.   divalproex  (DEPAKOTE ) 500 MG DR  tablet Take 1 tablet (500 mg total) by mouth 2 (two) times daily.   DULoxetine  HCl 40 MG CPEP Take 1 capsule (40 mg total) by mouth daily.   glimepiride  (AMARYL ) 4 MG tablet TAKE 1 TABLET (4 MG TOTAL) BY MOUTH 2 (TWO) TIMES DAILY.   hydrOXYzine  (ATARAX ) 50 MG tablet TAKE 1 TABLET BY MOUTH FOUR TIMES A DAY & TAKE 2 TABLETS EVERY NIGHT AT BEDTIME FOR ANXIETY, PANIC, AGITATION, AND SLEEP   lisinopril  (ZESTRIL ) 20 MG tablet TAKE 1 TABLET BY MOUTH DAILY   meloxicam  (MOBIC ) 7.5 MG tablet Take 1 tablet (7.5 mg total) by mouth daily.   metFORMIN  (GLUCOPHAGE ) 500 MG tablet TAKE 1 TABLET (500 MG TOTAL) BY MOUTH 2 (TWO) TIMES DAILY.   omeprazole (PRILOSEC OTC) 20 MG tablet Take 20 mg  by mouth daily as needed (for reflux).   propranolol  (INDERAL ) 10 MG tablet Take 1 tablet (10 mg total) by mouth 3 (three) times daily.   sildenafil  (REVATIO ) 20 MG tablet Take 1 tablet (20 mg total) by mouth daily as needed (as directed for E.D.).   valbenazine  (INGREZZA ) 80 MG capsule Take 1 capsule (80 mg total) by mouth daily.   No facility-administered medications prior to visit.    Review of Systems  Constitutional:  Positive for chills and diaphoresis.  HENT: Negative.    Eyes: Negative.   Respiratory:  Positive for cough.   Cardiovascular: Negative.   Genitourinary: Negative.   Musculoskeletal: Negative.   Skin: Negative.   Neurological:  Positive for tremors.  Endo/Heme/Allergies: Negative.   Psychiatric/Behavioral:  Positive for depression.   All other systems reviewed and are negative.         Objective:     BP (!) 140/90   Pulse 96   Temp 98.1 F (36.7 C)   Ht 5' 10 (1.778 m)   Wt 198 lb (89.8 kg)   SpO2 97%   BMI 28.41 kg/m  BP Readings from Last 3 Encounters:  12/08/23 (!) 140/90  12/05/23 (!) 154/102  11/30/23 115/77   Wt Readings from Last 3 Encounters:  12/08/23 198 lb (89.8 kg)  12/05/23 201 lb 6.4 oz (91.4 kg)  11/30/23 200 lb (90.7 kg)      Physical Exam Vitals and  nursing note reviewed.  Constitutional:      Appearance: Normal appearance. He is overweight. He is diaphoretic.  HENT:     Head: Normocephalic and atraumatic.     Right Ear: Tympanic membrane, ear canal and external ear normal.     Left Ear: Tympanic membrane, ear canal and external ear normal.     Nose: Nose normal.     Mouth/Throat:     Mouth: Mucous membranes are moist.     Pharynx: Oropharynx is clear.  Eyes:     Extraocular Movements: Extraocular movements intact.     Right eye: Normal extraocular motion and no nystagmus.     Left eye: Normal extraocular motion and no nystagmus.     Conjunctiva/sclera: Conjunctivae normal.     Pupils: Pupils are equal, round, and reactive to light.  Cardiovascular:     Rate and Rhythm: Normal rate and regular rhythm.     Pulses: Normal pulses.     Heart sounds: Normal heart sounds.  Pulmonary:     Effort: Pulmonary effort is normal.     Breath sounds: Normal breath sounds.  Abdominal:     General: Bowel sounds are normal.     Palpations: Abdomen is soft.  Genitourinary:    Comments: Deferred using shared decision making Musculoskeletal:        General: Normal range of motion.     Cervical back: Normal range of motion and neck supple.  Skin:    General: Skin is warm.     Capillary Refill: Capillary refill takes less than 2 seconds.  Neurological:     General: No focal deficit present.     Mental Status: He is alert. Mental status is at baseline.     GCS: GCS eye subscore is 4. GCS verbal subscore is 5. GCS motor subscore is 6.     Sensory: Sensation is intact.     Motor: Tremor present.  Psychiatric:        Mood and Affect: Mood normal.        Speech:  Speech normal.        Behavior: Behavior normal.        Thought Content: Thought content normal.        Cognition and Memory: Cognition and memory normal.        Judgment: Judgment normal.     Diabetic Foot Exam - Simple   Simple Foot Form Visual Inspection No deformities, no  ulcerations, no other skin breakdown bilaterally: Yes Sensation Testing Intact to touch and monofilament testing bilaterally: Yes Pulse Check Posterior Tibialis and Dorsalis pulse intact bilaterally: Yes Comments      No results found for any visits on 12/08/23. Last CBC Lab Results  Component Value Date   WBC 9.8 11/30/2023   HGB 17.3 (H) 11/30/2023   HCT 51.7 11/30/2023   MCV 84.8 11/30/2023   MCH 28.4 11/30/2023   RDW 14.7 11/30/2023   PLT 266 11/30/2023   Last metabolic panel Lab Results  Component Value Date   GLUCOSE 112 (H) 11/30/2023   NA 137 11/30/2023   K 4.3 11/30/2023   CL 97 (L) 11/30/2023   CO2 28 11/30/2023   BUN 17 11/30/2023   CREATININE 1.09 11/30/2023   GFRNONAA >60 11/30/2023   CALCIUM  9.6 11/30/2023   PROT 7.1 11/30/2023   ALBUMIN 3.7 11/30/2023   LABGLOB 2.4 09/01/2022   AGRATIO 1.8 09/01/2022   BILITOT 0.7 11/30/2023   ALKPHOS 56 11/30/2023   AST 22 11/30/2023   ALT 17 11/30/2023   ANIONGAP 12 11/30/2023   Last lipids Lab Results  Component Value Date   CHOL 145 07/06/2023   HDL 37 (L) 07/06/2023   LDLCALC 85 07/06/2023   TRIG 124 07/06/2023   CHOLHDL 3.9 07/06/2023   Last hemoglobin A1c Lab Results  Component Value Date   HGBA1C 5.9 (H) 10/10/2023   Last thyroid  functions Lab Results  Component Value Date   TSH 4.990 (H) 10/10/2023   T4TOTAL 5.4 10/10/2023   Last vitamin D  Lab Results  Component Value Date   VD25OH 29.1 (L) 09/01/2022   Last vitamin B12 and Folate No results found for: VITAMINB12, FOLATE      Assessment & Plan:    Routine Health Maintenance and Physical Exam  Immunization History  Administered Date(s) Administered   Influenza, Seasonal, Injecte, Preservative Fre 01/13/2023   Tdap 08/31/2022   Zoster Recombinant(Shingrix ) 11/17/2022    Health Maintenance  Topic Date Due   FOOT EXAM  Never done   Pneumococcal Vaccine: 19-49 Years (1 of 2 - PCV) Never done   Pneumococcal Vaccine: 50+  Years (1 of 2 - PCV) Never done   Hepatitis B Vaccines (1 of 3 - 19+ 3-dose series) Never done   Colonoscopy  Never done   Zoster Vaccines- Shingrix  (2 of 2) 01/12/2023   Diabetic kidney evaluation - Urine ACR  10/13/2023   OPHTHALMOLOGY EXAM  12/08/2023 (Originally 11/24/2023)   COVID-19 Vaccine (1) 12/24/2023 (Originally 03/04/1971)   INFLUENZA VACCINE  12/09/2023   HEMOGLOBIN A1C  04/10/2024   Diabetic kidney evaluation - eGFR measurement  11/29/2024   DTaP/Tdap/Td (2 - Td or Tdap) 08/30/2032   Hepatitis C Screening  Completed   HIV Screening  Completed   HPV VACCINES  Aged Out   Meningococcal B Vaccine  Aged Out    Discussed health benefits of physical activity, and encouraged him to engage in regular exercise appropriate for his age and condition.  Problem List Items Addressed This Visit     Type 2 diabetes mellitus with hyperglycemia, without  long-term current use of insulin  (HCC)   Continue Metformin  and Amaryl . A1c and uACR today. Foot exam today. Vaccines due. Retinal eye exam UTD. Recommend heart healthy diet such as Mediterranean diet with whole grains, fruits, vegetable, fish, lean meats, nuts, and olive oil. Limit salt. Encouraged moderate walking, 3-5 times/week for 30-50 minutes each session. Aim for at least 150 minutes.week. Goal should be pace of 3 miles/hours, or walking 1.5 miles in 30 minutes. Seek medical care for urinary frequency, extreme thirst, vision changes, lightheadedness, dizziness.  Follow up in 6 months or sooner if needed based on labs       Relevant Orders   Lipid panel   Hemoglobin A1c   Vitamin D , 25-OH,Total,IA(Refl)   Vitamin B12   CBC with Differential/Platelet   Comprehensive metabolic panel with GFR   Microalbumin / creatinine urine ratio   Essential hypertension   Chronic well controlled. Continue Lisinopril  20mg  daily. Encourage heart healthy diet and 150 minutes moderate intensity exercise weekly. Avoid tobacco products. Avoid excess  alcohol. Take medications as prescribed and bring medications and blood pressure log with cuff to each office visit. Seek medical care for chest pain, palpitations, shortness of breath with exertion, dizziness/lightheadedness, vision changes, recurrent headaches, or swelling of extremities. Follow up in 1 month recheck      Relevant Orders   Lipid panel   Hemoglobin A1c   Vitamin D , 25-OH,Total,IA(Refl)   Vitamin B12   CBC with Differential/Platelet   Comprehensive metabolic panel with GFR   Mixed hyperlipidemia   Continue Atorvastatin  20mg  daily. abs today.  I recommend consuming a heart healthy diet such as Mediterranean diet or DASH diet with whole grains, fruits, vegetable, fish, lean meats, nuts, and olive oil. Limit sweets and processed foods. I also encourage moderate intensity exercise 150 minutes weekly. This is 3-5 times weekly for 30-50 minutes each session. Goal should be pace of 3 miles/hours, or walking 1.5 miles in 30 minutes. The 10-year ASCVD risk score (Arnett DK, et al., 2019) is: 16.8%       Relevant Orders   Lipid panel   Hemoglobin A1c   Vitamin D , 25-OH,Total,IA(Refl)   Vitamin B12   CBC with Differential/Platelet   Comprehensive metabolic panel with GFR   Tremor   followed by Neurology and medications managed by Psychiatry Denies caffeine use.  Negative CTH, DaTscan .   Currently taking Depakote  500mg  BID, Duloxetine  40mg  daily, Hydroxyzine  50mg  QID PRN, Propanolol 10mg  TID, and Ingrezza  80mg  daily. Symptoms unchanged.   Follow up with Neuro, if signed off will work with Psychiatry for medication optimization starting with increasing Propanolol.       Relevant Orders   Magnesium    TSH   Physical exam, annual - Primary   Today your medical history was reviewed and routine physical exam with labs was performed. Recommend 150 minutes of moderate intensity exercise weekly and consuming a well-balanced diet. Advised to stop smoking if a smoker, avoid smoking if  a non-smoker, limit alcohol consumption to 1 drink per day for women and 2 drinks per day for men, and avoid illicit drug use. Counseled on safe sex practices and offered STI testing today. Counseled on the importance of sunscreen use. Counseled in mental health awareness and when to seek medical care. Vaccine maintenance discussed. Appropriate health maintenance items reviewed. Return to office in 1 year for annual physical exam.       Relevant Orders   Lipid panel   Hemoglobin A1c   Vitamin D , 25-OH,Total,IA(Refl)  Vitamin B12   CBC with Differential/Platelet   Comprehensive metabolic panel with GFR   Other Visit Diagnoses       Prostate cancer screening       Relevant Orders   PSA     Colon cancer screening       Relevant Orders   Cologuard      Return in about 4 weeks (around 01/05/2024) for follow-up tremor.     Jeoffrey GORMAN Barrio, [name]

## 2023-12-08 NOTE — Assessment & Plan Note (Signed)
 Chronic well controlled. Continue Lisinopril  20mg  daily. Encourage heart healthy diet and 150 minutes moderate intensity exercise weekly. Avoid tobacco products. Avoid excess alcohol. Take medications as prescribed and bring medications and blood pressure log with cuff to each office visit. Seek medical care for chest pain, palpitations, shortness of breath with exertion, dizziness/lightheadedness, vision changes, recurrent headaches, or swelling of extremities. Follow up in 1 month recheck

## 2023-12-09 LAB — COMPREHENSIVE METABOLIC PANEL WITH GFR
AG Ratio: 1.6 (calc) (ref 1.0–2.5)
ALT: 21 U/L (ref 9–46)
AST: 15 U/L (ref 10–35)
Albumin: 4.2 g/dL (ref 3.6–5.1)
Alkaline phosphatase (APISO): 65 U/L (ref 35–144)
BUN: 11 mg/dL (ref 7–25)
CO2: 31 mmol/L (ref 20–32)
Calcium: 9.6 mg/dL (ref 8.6–10.3)
Chloride: 97 mmol/L — ABNORMAL LOW (ref 98–110)
Creat: 1.03 mg/dL (ref 0.70–1.30)
Globulin: 2.7 g/dL (ref 1.9–3.7)
Glucose, Bld: 193 mg/dL — ABNORMAL HIGH (ref 65–99)
Potassium: 4 mmol/L (ref 3.5–5.3)
Sodium: 136 mmol/L (ref 135–146)
Total Bilirubin: 0.6 mg/dL (ref 0.2–1.2)
Total Protein: 6.9 g/dL (ref 6.1–8.1)
eGFR: 84 mL/min/1.73m2 (ref >=60)

## 2023-12-09 LAB — LIPID PANEL
Cholesterol: 199 mg/dL (ref <200)
HDL: 33 mg/dL — ABNORMAL LOW (ref >=40)
LDL Cholesterol (Calc): 124 mg/dL — ABNORMAL HIGH
Non-HDL Cholesterol (Calc): 166 mg/dL — ABNORMAL HIGH (ref <130)
Total CHOL/HDL Ratio: 6 (calc) — ABNORMAL HIGH (ref <5.0)
Triglycerides: 271 mg/dL — ABNORMAL HIGH (ref <150)

## 2023-12-09 LAB — CBC WITH DIFFERENTIAL/PLATELET
Absolute Lymphocytes: 2720 {cells}/uL (ref 850–3900)
Absolute Monocytes: 549 {cells}/uL (ref 200–950)
Basophils Absolute: 47 {cells}/uL (ref 0–200)
Basophils Relative: 0.8 %
Eosinophils Absolute: 130 {cells}/uL (ref 15–500)
Eosinophils Relative: 2.2 %
HCT: 48.7 % (ref 38.5–50.0)
Hemoglobin: 16.1 g/dL (ref 13.2–17.1)
MCH: 28.4 pg (ref 27.0–33.0)
MCHC: 33.1 g/dL (ref 32.0–36.0)
MCV: 86 fL (ref 80.0–100.0)
MPV: 8.7 fL (ref 7.5–12.5)
Monocytes Relative: 9.3 %
Neutro Abs: 2454 {cells}/uL (ref 1500–7800)
Neutrophils Relative %: 41.6 %
Platelets: 264 Thousand/uL (ref 140–400)
RBC: 5.66 Million/uL (ref 4.20–5.80)
RDW: 14.5 % (ref 11.0–15.0)
Total Lymphocyte: 46.1 %
WBC: 5.9 Thousand/uL (ref 3.8–10.8)

## 2023-12-09 LAB — MICROALBUMIN / CREATININE URINE RATIO
Creatinine, Urine: 255 mg/dL (ref 20–320)
Microalb Creat Ratio: 22 mg/g{creat} (ref <30)
Microalb, Ur: 5.6 mg/dL

## 2023-12-09 LAB — VITAMIN D 25 HYDROXY (VIT D DEFICIENCY, FRACTURES): Vit D, 25-Hydroxy: 27 ng/mL — ABNORMAL LOW (ref 30–100)

## 2023-12-09 LAB — PSA: PSA: 1.25 ng/mL (ref <=4.00)

## 2023-12-09 LAB — TSH: TSH: 1.64 m[IU]/L (ref 0.40–4.50)

## 2023-12-09 LAB — HEMOGLOBIN A1C
Hgb A1c MFr Bld: 6.3 % — ABNORMAL HIGH (ref <5.7)
Mean Plasma Glucose: 134 mg/dL
eAG (mmol/L): 7.4 mmol/L

## 2023-12-09 LAB — MAGNESIUM: Magnesium: 1.8 mg/dL (ref 1.5–2.5)

## 2023-12-09 LAB — VITAMIN B12: Vitamin B-12: 451 pg/mL (ref 200–1100)

## 2023-12-12 ENCOUNTER — Ambulatory Visit: Payer: Self-pay | Admitting: Family Medicine

## 2023-12-12 NOTE — Progress Notes (Signed)
 My chart result letter sent to pt. As well as vm left when call was unsuccessful.

## 2023-12-16 ENCOUNTER — Encounter (HOSPITAL_COMMUNITY): Payer: Self-pay | Admitting: Physician Assistant

## 2023-12-16 ENCOUNTER — Ambulatory Visit (INDEPENDENT_AMBULATORY_CARE_PROVIDER_SITE_OTHER): Admitting: Physician Assistant

## 2023-12-16 DIAGNOSIS — F3130 Bipolar disorder, current episode depressed, mild or moderate severity, unspecified: Secondary | ICD-10-CM | POA: Diagnosis not present

## 2023-12-16 DIAGNOSIS — F411 Generalized anxiety disorder: Secondary | ICD-10-CM | POA: Diagnosis not present

## 2023-12-16 DIAGNOSIS — G2401 Drug induced subacute dyskinesia: Secondary | ICD-10-CM | POA: Diagnosis not present

## 2023-12-16 MED ORDER — HYDROXYZINE HCL 50 MG PO TABS: ORAL_TABLET | ORAL | 1 refills | Status: DC

## 2023-12-16 MED ORDER — DULOXETINE HCL 60 MG PO CPEP: 60.0000 mg | ORAL_CAPSULE | Freq: Every day | ORAL | 1 refills | Status: DC

## 2023-12-16 MED ORDER — DIVALPROEX SODIUM 500 MG PO DR TAB: 500.0000 mg | DELAYED_RELEASE_TABLET | Freq: Two times a day (BID) | ORAL | 1 refills | Status: DC

## 2023-12-16 MED ORDER — PROPRANOLOL HCL 10 MG PO TABS
10.0000 mg | ORAL_TABLET | Freq: Three times a day (TID) | ORAL | 1 refills | Status: DC
Start: 2023-12-16 — End: 2024-01-26

## 2023-12-16 MED ORDER — VALBENAZINE TOSYLATE 80 MG PO CAPS: 80.0000 mg | ORAL_CAPSULE | Freq: Every day | ORAL | 1 refills | Status: DC

## 2023-12-16 NOTE — Progress Notes (Signed)
 BH MD/PA/NP OP Progress Note  12/16/2023 2:50 PM [name] [name]  MRN:  [id number]  Chief Complaint:  Chief Complaint  Patient presents with   Follow-up   Medication Management   HPI:   [name] [name] is a 58 year old male with a past psychiatric history significant for generalized anxiety disorder, bipolar 1 disorder (most recent episode depressed), and tardive dyskinesia who presents to [facility] for follow-up and medication management.  Patient was last seen by Dr. Harl on 10/10/2023.  During his last encounter, patient was being managed on the following psychiatric medications:  Ingrezza  80 mg daily Hydroxyzine  50 mg 4 times a day and 2 tablets every night at bedtime Propranolol  10 mg 3 times daily Duloxetine  HCL 40 mg daily Depakote  500 mg DR 2 times daily  Patient reports that he continues to take his medications regularly and denies experiencing any adverse side effects.  Patient endorses depression and rates his depression a 7 out of 10 with 10 being most severe.  Patient denies any specific triggers to his depression.  Patient endorses depressive episodes every day.  Patient endorses the following depressive symptoms: lack of motivation, decreased concentration, decreased energy, and irritability.  He denies feelings of sadness, feelings of guilt/worthlessness, or hopelessness.  Patient endorses anxiety and rates his anxiety a 6 out of 10.  Patient denies experiencing manic symptoms and further denies any new stressors at this time.  A PHQ-9 screen was performed with the patient scoring a 21.  A GAD-7 screen was also performed with the patient scoring a 19.  Patient is alert and oriented x 4, calm, cooperative, and fully engaged in conversation during the encounter.  Patient endorses depressed mood but states that he feels relaxed.  Patient exhibits depressed mood with congruent affect.  Patient denies suicidal or homicidal ideations.  He  further denies auditory or visual hallucinations and does not appear to be responding to internal/external stimuli.  Patient endorses increased sleep and receives on average 12 hours of sleep per night.  Patient endorses fair appetite and eats on average 3 meals per day.  Patient denies alcohol consumption, tobacco use, or illicit drug use.  Visit Diagnosis:    ICD-10-CM   1. Generalized anxiety disorder  F41.1 DULoxetine  (CYMBALTA ) 60 MG capsule    propranolol  (INDERAL ) 10 MG tablet    hydrOXYzine  (ATARAX ) 50 MG tablet    2. Bipolar I disorder, most recent episode depressed (HCC)  F31.30 divalproex  (DEPAKOTE ) 500 MG DR tablet      Past Psychiatric History:  Anxiety Depression Tardive dyskinesia Bipolar disorder PTSD   Past Medical History:  Past Medical History:  Diagnosis Date   Anxiety    Arthritis    Chest heaviness    Chills    Depression, major    Diabetes mellitus without complication (HCC)    Dizzy    Dyslipidemia    ED (erectile dysfunction)    Elevated CPK    GERD (gastroesophageal reflux disease)    HA (headache)    Hypertension    Hypertriglyceridemia    Insomnia    Kidney stone    Left-sided Bell's palsy    Morbid obesity (HCC)    Nausea    No appetite    Panniculitis    of right lower leg   Parkinson's disease (HCC)    PTSD (post-traumatic stress disorder)    Routine screening for STI (sexually transmitted infection) 01/05/2023   Weakness     Past Surgical History:  Procedure Laterality Date   NO PAST SURGERIES      Family Psychiatric History:  Sister - schizophrenia and bipolar disorder   Family History:  Family History  Problem Relation Age of Onset   Thyroid  disease Brother    Depression Brother    Heart disease Mother    Anxiety disorder Mother    Kidney failure Father    Diabetes Father    Anxiety disorder Father    Thyroid  disease Sister    Mental illness Sister    Heart attack Maternal Grandfather    Heart failure Paternal  Grandmother    Cancer Paternal Grandfather        bone    Social History:  Social History   Socioeconomic History   Marital status: Single    Spouse name: Not on file   Number of children: 0   Years of education: 12   Highest education level: Not on file  Occupational History   Not on file  Tobacco Use   Smoking status: Never   Smokeless tobacco: Never  Vaping Use   Vaping status: Never Used  Substance and Sexual Activity   Alcohol use: No   Drug use: Never   Sexual activity: Yes  Other Topics Concern   Not on file  Social History Narrative   Lives with housemate in a one story home.  Has a girlfriend. No children.  Is not working presently (was working [employer]).  Education: high school.    Social Drivers of Corporate investment banker Strain: Not on file  Food Insecurity: No Food Insecurity (11/06/2022)   Hunger Vital Sign    Worried About Running Out of Food in the Last Year: Never true    Ran Out of Food in the Last Year: Never true  Transportation Needs: No Transportation Needs (11/06/2022)   PRAPARE - Administrator, Civil Service (Medical): No    Lack of Transportation (Non-Medical): No  Physical Activity: Not on file  Stress: Not on file  Social Connections: Unknown (09/22/2021)   [facility]   Social Network    Social Network: Not on file    Allergies: No Known Allergies  Metabolic Disorder Labs: Lab Results  Component Value Date   HGBA1C 6.3 (H) 12/08/2023   MPG 134 12/08/2023   MPG 131 07/06/2023   No results found for: PROLACTIN Lab Results  Component Value Date   CHOL 199 12/08/2023   TRIG 271 (H) 12/08/2023   HDL 33 (L) 12/08/2023   CHOLHDL 6.0 (H) 12/08/2023   LDLCALC 124 (H) 12/08/2023   LDLCALC 85 07/06/2023   Lab Results  Component Value Date   TSH 1.64 12/08/2023   TSH 4.990 (H) 10/10/2023    Therapeutic Level Labs: No results found for: LITHIUM Lab Results  Component Value Date    VALPROATE 60 10/10/2023   No results found for: CBMZ  Current Medications: Current Outpatient Medications  Medication Sig Dispense Refill   DULoxetine  (CYMBALTA ) 60 MG capsule Take 1 capsule (60 mg total) by mouth daily. 30 capsule 1   aspirin  EC 81 MG tablet Take 81 mg by mouth daily.     atorvastatin  (LIPITOR) 20 MG tablet TAKE 1 TABLET BY MOUTH DAILY 30 tablet 1   Cholecalciferol (VITAMIN D3) 1000 units CAPS Take 1,000 Units by mouth daily.     divalproex  (DEPAKOTE ) 500 MG DR tablet Take 1 tablet (500 mg total) by mouth 2 (two) times daily. 60 tablet  1   glimepiride  (AMARYL ) 4 MG tablet TAKE 1 TABLET (4 MG TOTAL) BY MOUTH 2 (TWO) TIMES DAILY. 180 tablet 1   hydrOXYzine  (ATARAX ) 50 MG tablet TAKE 1 TABLET BY MOUTH FOUR TIMES A DAY & TAKE 2 TABLETS EVERY NIGHT AT BEDTIME FOR ANXIETY, PANIC, AGITATION, AND SLEEP 120 tablet 1   lisinopril  (ZESTRIL ) 20 MG tablet TAKE 1 TABLET BY MOUTH DAILY 30 tablet 1   meloxicam  (MOBIC ) 7.5 MG tablet Take 1 tablet (7.5 mg total) by mouth daily. 30 tablet 0   metFORMIN  (GLUCOPHAGE ) 500 MG tablet TAKE 1 TABLET (500 MG TOTAL) BY MOUTH 2 (TWO) TIMES DAILY. 60 tablet 3   omeprazole (PRILOSEC OTC) 20 MG tablet Take 20 mg by mouth daily as needed (for reflux).     propranolol  (INDERAL ) 10 MG tablet Take 1 tablet (10 mg total) by mouth 3 (three) times daily. 90 tablet 1   sildenafil  (REVATIO ) 20 MG tablet Take 1 tablet (20 mg total) by mouth daily as needed (as directed for E.D.). 10 tablet 2   valbenazine  (INGREZZA ) 80 MG capsule Take 1 capsule (80 mg total) by mouth daily. 30 capsule 3   No current facility-administered medications for this visit.     Musculoskeletal: Strength & Muscle Tone: within normal limits Gait & Station: normal Patient leans: N/A  Psychiatric Specialty Exam: Review of Systems  Psychiatric/Behavioral:  Positive for dysphoric mood and sleep disturbance. Negative for decreased concentration, hallucinations, self-injury and  suicidal ideas. The patient is nervous/anxious. The patient is not hyperactive.     Blood pressure 124/88, pulse 95, temperature 97.6 F (36.4 C), temperature source Oral, height 5' 10 (1.778 m), weight 193 lb 9.6 oz (87.8 kg), SpO2 99%.Body mass index is 27.78 kg/m.  General Appearance: Casual  Eye Contact:  Good  Speech:  Clear and Coherent and Normal Rate  Volume:  Normal  Mood:  Anxious and Depressed  Affect:  Congruent  Thought Process:  Coherent, Goal Directed, and Descriptions of Associations: Intact  Orientation:  Full (Time, Place, and Person)  Thought Content: WDL   Suicidal Thoughts:  No  Homicidal Thoughts:  No  Memory:  Immediate;   Good Recent;   Good Remote;   Good  Judgement:  Good  Insight:  Good  Psychomotor Activity:  TD  Concentration:  Concentration: Good and Attention Span: Good  Recall:  Good  Fund of Knowledge: Good  Language: Good  Akathisia:  No  Handed:  Right  AIMS (if indicated): done; 21  Assets:  Communication Skills Desire for Improvement Financial Resources/Insurance Housing Social Support  ADL's:  Intact  Cognition: WNL  Sleep:  Fair   Screenings: AIMS    Flowsheet Row Clinical Support from 12/16/2023 in [facility] Clinical Support from 10/10/2023 in [facility] Clinical Support from 09/05/2023 in [facility] Clinical Support from 06/16/2023 in [facility] Clinical Support from 03/10/2023 in [facility]  AIMS Total Score 21 15 10 6 6    GAD-7    Flowsheet Row Clinical Support from 12/16/2023 in [facility] Office Visit from 10/13/2023 in [facility] Clinical Support from 10/10/2023 in [facility] Clinical Support from 09/05/2023 in [facility] Clinical Support from 06/16/2023 in [facility]  Total GAD-7 Score 19 15 12 15 16    PHQ2-9    Flowsheet Row Clinical Support  from 12/16/2023 in [facility] Office Visit from 10/13/2023 in [facility] Clinical Support from 10/10/2023 in [facility] Clinical Support from 09/05/2023 in [facility] Clinical Support from 06/16/2023 in [facility]  PHQ-2 Total Score 6 6 2 5 2   PHQ-9 Total Score 21 20 15 17 11    Flowsheet Row Clinical Support from 12/16/2023 in [facility] ED from 11/30/2023 in [facility] Department at [facility] Clinical Support from 11/23/2022 in [facility]  C-SSRS RISK CATEGORY No Risk No Risk Error: Q7 should not be populated when Q6 is No     Assessment and Plan:   Harveer Sadler is a 58 year old male with a past psychiatric history significant for generalized anxiety disorder, bipolar 1 disorder (most recent episode depressed), and tardive dyskinesia who presents to [facility] for follow-up and medication management.  Patient presents to the encounter stating that he continues to take his medications regularly.  Patient exhibits involuntary movements indicative of tardive dyskinesia.  An aims assessment was performed with the patient scoring of 21.  Patient is currently on the max dosage of Ingrezza  at 80 mg daily.  Patient has not tried any other medications for the management of his symptoms.  Patient reports that he would like to remain on his current dose of Ingrezza  but reports that he would explore other options of managing his TD once he is set back up with Dr. Harl.  Patient denies a past history of using Austedo for the management of his TD.  Patient presents today encounter endorsing depression and anxiety.  A PHQ-9 screen was performed with  the patient scoring a 21.  A GAD-7 screen was also performed with the patient scoring a 19.  Provider recommended increasing patient's Cymbalta  dosage from 40 mg to 60 mg daily for the management of his depressive symptoms and anxiety.  Patient was agreeable to recommendation.  Patient to continue taking all other medications as prescribed.  Patient's medications to be e-prescribed to pharmacy of choice.  A [place] Suicide Severity Rating Scale was performed with the patient being considered no risk.  Patient denies suicidal ideations and is able to contract for safety following the conclusion of the encounter.  Patient's labs and EKG are up to date.  Collaboration of Care: Collaboration of Care: Medication Management AEB provider managing patient's psychiatric medications, Primary Care Provider AEB patient being seen by a family medicine provider, and Psychiatrist AEB patient being followed by mental health provider at this facility  Patient/Guardian was advised Release of Information must be obtained prior to any record release in order to collaborate their care with an outside provider. Patient/Guardian was advised if they have not already done so to contact the registration department to sign all necessary forms in order for us  to release information regarding their care.   Consent: Patient/Guardian gives verbal consent for treatment and assignment of benefits for services provided during this visit. Patient/Guardian expressed understanding and agreed to proceed.   1. Generalized anxiety disorder  - DULoxetine  (CYMBALTA ) 60 MG capsule; Take 1 capsule (60 mg total) by mouth daily.  Dispense: 30 capsule; Refill: 1 - propranolol  (INDERAL ) 10 MG tablet; Take 1 tablet (10 mg total) by mouth 3 (three) times daily.  Dispense: 90 tablet; Refill: 1 - hydrOXYzine  (ATARAX ) 50 MG tablet; TAKE 1 TABLET BY MOUTH FOUR TIMES A  DAY & TAKE 2 TABLETS EVERY NIGHT AT BEDTIME FOR ANXIETY, PANIC, AGITATION, AND SLEEP   Dispense: 120 tablet; Refill: 1  2. Bipolar I disorder, most recent episode depressed (HCC)  - divalproex  (DEPAKOTE ) 500 MG DR tablet; Take 1 tablet (500 mg total) by mouth 2 (two) times daily.  Dispense: 60 tablet; Refill: 1  3. Tardive dyskinesia  - valbenazine  (INGREZZA ) 80 MG capsule; Take 1 capsule (80 mg total) by mouth daily.  Dispense: 30 capsule; Refill: 1  Patient to follow up in 7 weeks Provider spent a total of 20 minutes with the patient/reviewing patient's chart  Reginia FORBES Bolster, PA 12/16/2023, 2:50 PM

## 2024-01-10 ENCOUNTER — Ambulatory Visit: Admitting: Family Medicine

## 2024-01-10 ENCOUNTER — Encounter: Payer: Self-pay | Admitting: Family Medicine

## 2024-01-10 VITALS — BP 119/72 | HR 67 | Temp 98.3°F | Ht 70.0 in | Wt 206.4 lb

## 2024-01-10 DIAGNOSIS — R251 Tremor, unspecified: Secondary | ICD-10-CM

## 2024-01-10 DIAGNOSIS — Z23 Encounter for immunization: Secondary | ICD-10-CM | POA: Diagnosis not present

## 2024-01-10 NOTE — Assessment & Plan Note (Signed)
 Improving. Worse at night so will increase Propanolol to 20mg  for his night time dose.  followed by Neurology and medications managed by Psychiatry Denies caffeine use.  Negative CTH, DaTscan .   Currently taking Depakote  500mg  BID, Duloxetine  60mg  daily, Hydroxyzine  50mg  QID PRN, Propanolol 10mg  TID, and Ingrezza  80mg  daily. Symptoms unchanged.

## 2024-01-10 NOTE — Progress Notes (Signed)
 Subjective:  HPI: [name] [name] is a 58 y.o. male presenting on 01/10/2024 for Follow-up (4wk f/u for tremor, having a hard time sleeping  )   HPI Patient is in today for follow-up or tremor. This is ongoing since 10/2022. He has had a workup with Neurology who has signed off as his Dat Scan showed no Parkinsons or Parkinsonian syndrome. He is working with psychiatry on medication adjustments for tardive dyskinesia. His Duloxetine  was recently increased from 40mg  daily to 60mg  daily at his last visit 1 month ago. He is following up in 7 weeks and they are considering adding Austedo.Today he reports his tremors are intermittent as opposed to constant and worse at night.   Per Psychiatry 10/23/2023 for reference only: Provider did refer patient to neurology for further assessment. He has an upcoming appointment on 10/13/2023. Provider discussed switching from Ingrezza  to Austedo pending neurology results as patient was told in the past that he could have early onset of parkinsonism.    Review of Systems  All other systems reviewed and are negative.   Relevant past medical history reviewed and updated as indicated.   Past Medical History:  Diagnosis Date   Anxiety    Arthritis    Chest heaviness    Chills    Depression, major    Diabetes mellitus without complication (HCC)    Dizzy    Dyslipidemia    ED (erectile dysfunction)    Elevated CPK    GERD (gastroesophageal reflux disease)    HA (headache)    Hypertension    Hypertriglyceridemia    Insomnia    Kidney stone    Left-sided Bell's palsy    Morbid obesity (HCC)    Nausea    No appetite    Panniculitis    of right lower leg   Parkinson's disease (HCC)    PTSD (post-traumatic stress disorder)    Routine screening for STI (sexually transmitted infection) 01/05/2023   Weakness      Past Surgical History:  Procedure Laterality Date   NO PAST SURGERIES      Allergies and medications reviewed and updated.   Current  Outpatient Medications:    aspirin  EC 81 MG tablet, Take 81 mg by mouth daily., Disp: , Rfl:    atorvastatin  (LIPITOR) 20 MG tablet, TAKE 1 TABLET BY MOUTH DAILY, Disp: 30 tablet, Rfl: 1   Cholecalciferol (VITAMIN D3) 1000 units CAPS, Take 1,000 Units by mouth daily., Disp: , Rfl:    divalproex  (DEPAKOTE ) 500 MG DR tablet, Take 1 tablet (500 mg total) by mouth 2 (two) times daily., Disp: 60 tablet, Rfl: 1   DULoxetine  (CYMBALTA ) 60 MG capsule, Take 1 capsule (60 mg total) by mouth daily., Disp: 30 capsule, Rfl: 1   glimepiride  (AMARYL ) 4 MG tablet, TAKE 1 TABLET (4 MG TOTAL) BY MOUTH 2 (TWO) TIMES DAILY., Disp: 180 tablet, Rfl: 1   hydrOXYzine  (ATARAX ) 50 MG tablet, TAKE 1 TABLET BY MOUTH FOUR TIMES A DAY & TAKE 2 TABLETS EVERY NIGHT AT BEDTIME FOR ANXIETY, PANIC, AGITATION, AND SLEEP, Disp: 120 tablet, Rfl: 1   lisinopril  (ZESTRIL ) 20 MG tablet, TAKE 1 TABLET BY MOUTH DAILY, Disp: 30 tablet, Rfl: 1   meloxicam  (MOBIC ) 7.5 MG tablet, Take 1 tablet (7.5 mg total) by mouth daily., Disp: 30 tablet, Rfl: 0   metFORMIN  (GLUCOPHAGE ) 500 MG tablet, TAKE 1 TABLET (500 MG TOTAL) BY MOUTH 2 (TWO) TIMES DAILY., Disp: 60 tablet, Rfl: 3   omeprazole (PRILOSEC OTC) 20  MG tablet, Take 20 mg by mouth daily as needed (for reflux)., Disp: , Rfl:    propranolol  (INDERAL ) 10 MG tablet, Take 1 tablet (10 mg total) by mouth 3 (three) times daily., Disp: 90 tablet, Rfl: 1   sildenafil  (REVATIO ) 20 MG tablet, Take 1 tablet (20 mg total) by mouth daily as needed (as directed for E.D.)., Disp: 10 tablet, Rfl: 2   valbenazine  (INGREZZA ) 80 MG capsule, Take 1 capsule (80 mg total) by mouth daily., Disp: 30 capsule, Rfl: 1  No Known Allergies  Objective:   BP 119/72   Pulse 67   Temp 98.3 F (36.8 C)   Ht 5' 10 (1.778 m)   Wt 206 lb 6.4 oz (93.6 kg)   SpO2 98%   BMI 29.62 kg/m      01/10/2024    4:03 PM 12/16/2023    2:49 PM 12/16/2023   11:12 AM  Vitals with BMI  Height 5' 10  5' 10  Weight 206 lbs 6 oz  193  lbs 10 oz  BMI 29.62  27.78  Systolic 119 124 861  Diastolic 72 88 90  Pulse 67  95     Physical Exam Vitals and nursing note reviewed.  Constitutional:      Appearance: Normal appearance. He is normal weight.  HENT:     Head: Normocephalic and atraumatic.  Skin:    General: Skin is warm and dry.     Capillary Refill: Capillary refill takes less than 2 seconds.  Neurological:     General: No focal deficit present.     Mental Status: He is alert and oriented to person, place, and time. Mental status is at baseline.     Motor: Tremor present.  Psychiatric:        Mood and Affect: Mood normal.        Behavior: Behavior normal.        Thought Content: Thought content normal.        Judgment: Judgment normal.     Assessment & Plan:  Tremor Assessment & Plan: Improving. Worse at night so will increase Propanolol to 20mg  for his night time dose.  followed by Neurology and medications managed by Psychiatry Denies caffeine use.  Negative CTH, DaTscan .   Currently taking Depakote  500mg  BID, Duloxetine  60mg  daily, Hydroxyzine  50mg  QID PRN, Propanolol 10mg  TID, and Ingrezza  80mg  daily. Symptoms unchanged.        Follow up plan: Return in about 4 months (around 05/11/2024) for chronic follow-up with labs 1 week prior.  Jeoffrey GORMAN Barrio, [name]

## 2024-01-26 ENCOUNTER — Ambulatory Visit (INDEPENDENT_AMBULATORY_CARE_PROVIDER_SITE_OTHER): Admitting: Psychiatry

## 2024-01-26 ENCOUNTER — Encounter (HOSPITAL_COMMUNITY): Payer: Self-pay | Admitting: Psychiatry

## 2024-01-26 DIAGNOSIS — G2401 Drug induced subacute dyskinesia: Secondary | ICD-10-CM

## 2024-01-26 DIAGNOSIS — F411 Generalized anxiety disorder: Secondary | ICD-10-CM

## 2024-01-26 DIAGNOSIS — F3130 Bipolar disorder, current episode depressed, mild or moderate severity, unspecified: Secondary | ICD-10-CM

## 2024-01-26 MED ORDER — VALBENAZINE TOSYLATE 80 MG PO CAPS: 80.0000 mg | ORAL_CAPSULE | Freq: Every day | ORAL | 3 refills | Status: DC

## 2024-01-26 MED ORDER — HYDROXYZINE HCL 50 MG PO TABS: ORAL_TABLET | ORAL | 3 refills | Status: DC

## 2024-01-26 MED ORDER — BENZTROPINE MESYLATE 1 MG PO TABS: 1.0000 mg | ORAL_TABLET | Freq: Two times a day (BID) | ORAL | 3 refills | Status: DC

## 2024-01-26 MED ORDER — PROPRANOLOL HCL 10 MG PO TABS: 10.0000 mg | ORAL_TABLET | Freq: Three times a day (TID) | ORAL | 1 refills | Status: DC

## 2024-01-26 MED ORDER — DULOXETINE HCL 40 MG PO CPEP: 80.0000 mg | ORAL_CAPSULE | Freq: Two times a day (BID) | ORAL | 3 refills | Status: DC

## 2024-01-26 MED ORDER — DIVALPROEX SODIUM 500 MG PO DR TAB: 500.0000 mg | DELAYED_RELEASE_TABLET | Freq: Two times a day (BID) | ORAL | 3 refills | Status: DC

## 2024-01-26 NOTE — Progress Notes (Signed)
 BH MD/PA/NP OP Progress Note         01/26/2024 4:17 PM [name] [name]  MRN:  [id number]  Chief Complaint: I am depressed.   HPI:  58 year old male seen today for follow up psychiatric evaluation. He has a history of anxiety, depression and PTSD.  He has a psychiatric history of depression, PTSD, Tardive dyskinesia, anxiety, and bipolar disorder.  He is currently managed on Depakote  500 mg twice daily propranolol  10 mg 3 times daily, Ingrezza  80 mg daily, Cymbalta  60 mg, and hydroxyzine  50 mg 4 times daily as needed.  He reports that his medications are somewhat effective in managing his psychiatric conditions.    Today he was well groomed, pleasant, cooperative, engaged in conversation, and maintained eye contact. Patient reports that he has been depressed.  He informed Clinical research associate that he lacks motivation to do things that he once to enjoy.  He also notes that his physical health is exacerbating his mental health.  Patient continues to suffer from symptoms of TD.  Patient was assessed for parkinsonism in April 2025.  Per chart review the patient's neuro history and examination were not telltale for idiopathic Parkinson's disease due to lack of obvious lateralization primarily. His provider discussed proceeding with a DaTscan  to determine if he has primary parkinsonism or secondary parkinsonism. Provider conducted an aims assessment and patient scored a 9.  Provider discussed adding an anticholinergic (cogentin ) to help with movements. Provider also discussed switching from Ingrezza  to Austedo pending improvement or worsening of symptoms. He endorsed understanding and reports that he will consider this.   Since his last visit he notes that his anxiety and depression has increased due to his uncontrollable body movements.   Today provider conducted GAD-7 and patient scored a 21. Provider also conducted PHQ-9.  Scored a 19.  He endorses sleeping 8 or more hours nightly.  Patient reports that his  appetite has reduced and notes that he is lost over 5 pounds since his last visit.  Today he denies SI/HI/AVH, mania, paranoia.    Today Cymbalta  60 mg increased to 40 mg twice a day.  Patient also agreeable to starting Cogentin  1 mg twice daily to help manage symptoms of TD.Potential side effects of medication and risks vs benefits of treatment vs non-treatment were explained and discussed. All questions were answered. He will continue other medications as prescribed.   No other concerns at this time.      ICD-10-CM   1. Generalized anxiety disorder  F41.1 hydrOXYzine  (ATARAX ) 50 MG tablet    propranolol  (INDERAL ) 10 MG tablet    DULoxetine  40 MG CPEP    2. Bipolar I disorder, most recent episode depressed (HCC)  F31.30 divalproex  (DEPAKOTE ) 500 MG DR tablet    3. Tardive dyskinesia  G24.01 valbenazine  (INGREZZA ) 80 MG capsule                  Past Psychiatric History: anxiety, depression, Tardive dyskinesia, bipolar disorder, and PTSD  Past Medical History:  Past Medical History:  Diagnosis Date   Anxiety    Arthritis    Chest heaviness    Chills    Depression, major    Diabetes mellitus without complication (HCC)    Dizzy    Dyslipidemia    ED (erectile dysfunction)    Elevated CPK    GERD (gastroesophageal reflux disease)    HA (headache)    Hypertension    Hypertriglyceridemia    Insomnia    Kidney stone  Left-sided Bell's palsy    Morbid obesity (HCC)    Nausea    No appetite    Panniculitis    of right lower leg   Parkinson's disease (HCC)    PTSD (post-traumatic stress disorder)    Routine screening for STI (sexually transmitted infection) 01/05/2023   Weakness     Past Surgical History:  Procedure Laterality Date   NO PAST SURGERIES      Family Psychiatric History: Sister schizophrenia and bipolar disorder  Family History:  Family History  Problem Relation Age of Onset   Thyroid  disease Brother    Depression Brother    Heart disease  Mother    Anxiety disorder Mother    Kidney failure Father    Diabetes Father    Anxiety disorder Father    Thyroid  disease Sister    Mental illness Sister    Heart attack Maternal Grandfather    Heart failure Paternal Grandmother    Cancer Paternal Grandfather        bone    Social History:  Social History   Socioeconomic History   Marital status: Single    Spouse name: Not on file   Number of children: 0   Years of education: 12   Highest education level: Not on file  Occupational History   Not on file  Tobacco Use   Smoking status: Never   Smokeless tobacco: Never  Vaping Use   Vaping status: Never Used  Substance and Sexual Activity   Alcohol use: No   Drug use: Never   Sexual activity: Yes  Other Topics Concern   Not on file  Social History Narrative   Lives with housemate in a one story home.  Has a girlfriend. No children.  Is not working presently (was working [employer]).  Education: high school.    Social Drivers of Corporate investment banker Strain: Not on file  Food Insecurity: No Food Insecurity (11/06/2022)   Hunger Vital Sign    Worried About Running Out of Food in the Last Year: Never true    Ran Out of Food in the Last Year: Never true  Transportation Needs: No Transportation Needs (11/06/2022)   PRAPARE - Administrator, Civil Service (Medical): No    Lack of Transportation (Non-Medical): No  Physical Activity: Not on file  Stress: Not on file  Social Connections: Unknown (09/22/2021)   Received from [facility]   Social Network    Social Network: Not on file    Allergies: No Known Allergies  Metabolic Disorder Labs: Lab Results  Component Value Date   HGBA1C 6.3 (H) 12/08/2023   MPG 134 12/08/2023   MPG 131 07/06/2023   No results found for: PROLACTIN Lab Results  Component Value Date   CHOL 199 12/08/2023   TRIG 271 (H) 12/08/2023   HDL 33 (L) 12/08/2023   CHOLHDL 6.0 (H) 12/08/2023   LDLCALC 124 (H)  12/08/2023   LDLCALC 85 07/06/2023   Lab Results  Component Value Date   TSH 1.64 12/08/2023   TSH 4.990 (H) 10/10/2023    Therapeutic Level Labs: No results found for: LITHIUM Lab Results  Component Value Date   VALPROATE 60 10/10/2023   No results found for: CBMZ  Current Medications: Current Outpatient Medications  Medication Sig Dispense Refill   benztropine  (COGENTIN ) 1 MG tablet Take 1 tablet (1 mg total) by mouth 2 (two) times daily. 60 tablet 3   aspirin  EC 81 MG  tablet Take 81 mg by mouth daily.     atorvastatin  (LIPITOR) 20 MG tablet TAKE 1 TABLET BY MOUTH DAILY 30 tablet 1   Cholecalciferol (VITAMIN D3) 1000 units CAPS Take 1,000 Units by mouth daily.     divalproex  (DEPAKOTE ) 500 MG DR tablet Take 1 tablet (500 mg total) by mouth 2 (two) times daily. 60 tablet 3   DULoxetine  40 MG CPEP Take 2 capsules (80 mg total) by mouth 2 (two) times daily. 60 capsule 3   glimepiride  (AMARYL ) 4 MG tablet TAKE 1 TABLET (4 MG TOTAL) BY MOUTH 2 (TWO) TIMES DAILY. 180 tablet 1   hydrOXYzine  (ATARAX ) 50 MG tablet TAKE 1 TABLET BY MOUTH FOUR TIMES A DAY & TAKE 2 TABLETS EVERY NIGHT AT BEDTIME FOR ANXIETY, PANIC, AGITATION, AND SLEEP 120 tablet 3   lisinopril  (ZESTRIL ) 20 MG tablet TAKE 1 TABLET BY MOUTH DAILY 30 tablet 1   meloxicam  (MOBIC ) 7.5 MG tablet Take 1 tablet (7.5 mg total) by mouth daily. 30 tablet 0   metFORMIN  (GLUCOPHAGE ) 500 MG tablet TAKE 1 TABLET (500 MG TOTAL) BY MOUTH 2 (TWO) TIMES DAILY. 60 tablet 3   omeprazole (PRILOSEC OTC) 20 MG tablet Take 20 mg by mouth daily as needed (for reflux).     propranolol  (INDERAL ) 10 MG tablet Take 1 tablet (10 mg total) by mouth 3 (three) times daily. 90 tablet 1   sildenafil  (REVATIO ) 20 MG tablet Take 1 tablet (20 mg total) by mouth daily as needed (as directed for E.D.). 10 tablet 2   valbenazine  (INGREZZA ) 80 MG capsule Take 1 capsule (80 mg total) by mouth daily. 30 capsule 3   No current facility-administered medications for  this visit.     Musculoskeletal: Strength & Muscle Tone: within normal limits Gait & Station: normal, broad based Patient leans: N/A  Psychiatric Specialty Exam: Review of Systems  Blood pressure 118/89, pulse 70, height 5' 10 (1.778 m), weight 205 lb (93 kg), SpO2 100%.Body mass index is 29.41 kg/m.  General Appearance: Well Groomed  Eye Contact:  Good  Speech:  Clear and Coherent and Normal Rate  Volume:  Normal  Mood:  Anxious and Depressed  Affect:  Congruent  Thought Process:  Coherent, Goal Directed and Linear  Orientation:  Full (Time, Place, and Person)  Thought Content: WDL and Logical   Suicidal Thoughts:  No  Homicidal Thoughts:  No  Memory:  Immediate;   Good Recent;   Good Remote;   Good  Judgement:  Good  Insight:  Good  Psychomotor Activity:  Normal  Concentration:  Concentration: Good and Attention Span: Good  Recall:  Good  Fund of Knowledge: Good  Language: Good  Akathisia:  No  Handed:  Right  AIMS (if indicated): done, 9  Assets:  Communication Skills Desire for Improvement Financial Resources/Insurance Housing Social Support  ADL's:  Intact  Cognition: WNL  Sleep:  Good   Screenings: AIMS    Flowsheet Row Clinical Support from 01/26/2024 in [facility] Clinical Support from 12/16/2023 in [facility] Clinical Support from 10/10/2023 in [facility] Clinical Support from 09/05/2023 in [facility] Clinical Support from 06/16/2023 in [facility]  AIMS Total Score 9 21 15 10 6    GAD-7    Flowsheet Row Clinical Support from 01/26/2024 in [facility] Office Visit from 01/10/2024 in [facility] Clinical Support from 12/16/2023 in  [facility] Office Visit from 10/13/2023 in [facility] Clinical Support from  10/10/2023 in [facility]  Total GAD-7 Score 21 19 19 15 12    PHQ2-9    Flowsheet Row Clinical Support from 01/26/2024 in [facility] Office Visit from 01/10/2024 in [facility] Clinical Support from 12/16/2023 in [facility] Office Visit from 10/13/2023 in [facility] Clinical Support from 10/10/2023 in [facility]  PHQ-2 Total Score 6 6 6 6 2   PHQ-9 Total Score 19 21 21 20 15    Flowsheet Row Clinical Support from 01/26/2024 in [facility] Clinical Support from 12/16/2023 in [facility] ED from 11/30/2023 in [facility] at [facility]  C-SSRS RISK CATEGORY No Risk No Risk No Risk     Assessment and Plan: Patient endorses symptoms of anxiety, depression, and TD.Today Cymbalta  60 mg increased to 40 mg twice a day.  Patient also agreeable to starting Cogentin  1 mg twice daily to help manage symptoms of TD.  1. Generalized anxiety disorder  Continue- hydrOXYzine  (ATARAX ) 50 MG tablet; TAKE 1 TABLET BY MOUTH FOUR TIMES A DAY & TAKE 2 TABLETS EVERY NIGHT AT BEDTIME FOR ANXIETY, PANIC, AGITATION, AND SLEEP  Dispense: 120 tablet; Refill: 3 Continue- propranolol  (INDERAL ) 10 MG tablet; Take 1 tablet (10 mg total) by mouth 3 (three) times daily.  Dispense: 90 tablet; Refill: 1 Increased- DULoxetine  40 MG CPEP; Take 2 capsules (80 mg total) by mouth 2 (two) times daily.  Dispense: 60 capsule; Refill: 3  2. Bipolar I disorder, most recent episode depressed (HCC)  Continue- divalproex  (DEPAKOTE ) 500 MG DR tablet; Take 1 tablet (500 mg total) by mouth 2 (two) times daily.  Dispense: 60 tablet; Refill: 3  3. Tardive dyskinesia  Continue- valbenazine  (INGREZZA ) 80 MG capsule; Take 1 capsule (80 mg total) by mouth daily.  Dispense: 30 capsule; Refill:  3   Follow-up in 3 months

## 2024-02-15 ENCOUNTER — Other Ambulatory Visit: Payer: Self-pay | Admitting: Family Medicine

## 2024-02-15 DIAGNOSIS — I10 Essential (primary) hypertension: Secondary | ICD-10-CM

## 2024-02-19 ENCOUNTER — Emergency Department (HOSPITAL_COMMUNITY)
Admission: EM | Admit: 2024-02-19 | Discharge: 2024-02-20 | Disposition: A | Discharge status: Home / Self Care | Attending: Emergency Medicine | Admitting: Emergency Medicine

## 2024-02-19 ENCOUNTER — Other Ambulatory Visit: Payer: Self-pay

## 2024-02-19 ENCOUNTER — Emergency Department (HOSPITAL_COMMUNITY)

## 2024-02-19 ENCOUNTER — Emergency Department (HOSPITAL_COMMUNITY)
Admission: EM | Admit: 2024-02-19 | Discharge: 2024-02-19 | Disposition: A | Discharge status: Home / Self Care | Attending: Emergency Medicine | Admitting: Emergency Medicine

## 2024-02-19 ENCOUNTER — Encounter (HOSPITAL_COMMUNITY): Payer: Self-pay

## 2024-02-19 ENCOUNTER — Encounter (HOSPITAL_COMMUNITY): Payer: Self-pay | Admitting: Emergency Medicine

## 2024-02-19 DIAGNOSIS — G20A1 Parkinson's disease without dyskinesia, without mention of fluctuations: Secondary | ICD-10-CM | POA: Diagnosis not present

## 2024-02-19 DIAGNOSIS — Z7982 Long term (current) use of aspirin: Secondary | ICD-10-CM | POA: Diagnosis not present

## 2024-02-19 DIAGNOSIS — M545 Low back pain, unspecified: Secondary | ICD-10-CM | POA: Diagnosis present

## 2024-02-19 DIAGNOSIS — R251 Tremor, unspecified: Secondary | ICD-10-CM | POA: Insufficient documentation

## 2024-02-19 DIAGNOSIS — W19XXXA Unspecified fall, initial encounter: Secondary | ICD-10-CM | POA: Insufficient documentation

## 2024-02-19 DIAGNOSIS — G8929 Other chronic pain: Secondary | ICD-10-CM | POA: Diagnosis not present

## 2024-02-19 DIAGNOSIS — Z79899 Other long term (current) drug therapy: Secondary | ICD-10-CM | POA: Insufficient documentation

## 2024-02-19 DIAGNOSIS — E119 Type 2 diabetes mellitus without complications: Secondary | ICD-10-CM | POA: Insufficient documentation

## 2024-02-19 DIAGNOSIS — I10 Essential (primary) hypertension: Secondary | ICD-10-CM | POA: Insufficient documentation

## 2024-02-19 DIAGNOSIS — Z7984 Long term (current) use of oral hypoglycemic drugs: Secondary | ICD-10-CM | POA: Insufficient documentation

## 2024-02-19 LAB — COMPREHENSIVE METABOLIC PANEL WITH GFR
ALT: 10 U/L (ref 0–44)
AST: 17 U/L (ref 15–41)
Albumin: 4.2 g/dL (ref 3.5–5.0)
Alkaline Phosphatase: 49 U/L (ref 38–126)
Anion gap: 11 (ref 5–15)
BUN: 19 mg/dL (ref 6–20)
CO2: 26 mmol/L (ref 22–32)
Calcium: 9.8 mg/dL (ref 8.9–10.3)
Chloride: 99 mmol/L (ref 98–111)
Creatinine, Ser: 1.04 mg/dL (ref 0.61–1.24)
GFR, Estimated: >60 mL/min (ref >60)
Glucose, Bld: 122 mg/dL — ABNORMAL HIGH (ref 70–99)
Potassium: 4.1 mmol/L (ref 3.5–5.1)
Sodium: 136 mmol/L (ref 135–145)
Total Bilirubin: 0.5 mg/dL (ref 0.0–1.2)
Total Protein: 6.9 g/dL (ref 6.5–8.1)

## 2024-02-19 LAB — URINALYSIS, ROUTINE W REFLEX MICROSCOPIC
Bilirubin Urine: NEGATIVE
Glucose, UA: NEGATIVE mg/dL
Hgb urine dipstick: NEGATIVE
Ketones, ur: NEGATIVE mg/dL
Leukocytes,Ua: NEGATIVE
Nitrite: NEGATIVE
Protein, ur: NEGATIVE mg/dL
Specific Gravity, Urine: 1.018 (ref 1.005–1.030)
pH: 6 (ref 5.0–8.0)

## 2024-02-19 LAB — CBC WITH DIFFERENTIAL/PLATELET
Abs Immature Granulocytes: 0.03 K/uL (ref 0.00–0.07)
Basophils Absolute: 0.1 K/uL (ref 0.0–0.1)
Basophils Relative: 1 %
Eosinophils Absolute: 0 K/uL (ref 0.0–0.5)
Eosinophils Relative: 1 %
HCT: 42.8 % (ref 39.0–52.0)
Hemoglobin: 14.2 g/dL (ref 13.0–17.0)
Immature Granulocytes: 0 %
Lymphocytes Relative: 33 %
Lymphs Abs: 2.8 K/uL (ref 0.7–4.0)
MCH: 28.2 pg (ref 26.0–34.0)
MCHC: 33.2 g/dL (ref 30.0–36.0)
MCV: 85.1 fL (ref 80.0–100.0)
Monocytes Absolute: 0.9 K/uL (ref 0.1–1.0)
Monocytes Relative: 11 %
Neutro Abs: 4.5 K/uL (ref 1.7–7.7)
Neutrophils Relative %: 54 %
Platelets: 217 K/uL (ref 150–400)
RBC: 5.03 MIL/uL (ref 4.22–5.81)
RDW: 14 % (ref 11.5–15.5)
WBC: 8.4 K/uL (ref 4.0–10.5)
nRBC: 0 % (ref 0.0–0.2)

## 2024-02-19 MED ORDER — SODIUM CHLORIDE 0.9 % IV BOLUS
500.0000 mL | Freq: Once | INTRAVENOUS | Status: AC
  Administered 2024-02-19: 500 mL via INTRAVENOUS

## 2024-02-19 MED ORDER — ACETAMINOPHEN 500 MG PO TABS
1000.0000 mg | ORAL_TABLET | Freq: Once | ORAL | Status: AC
  Administered 2024-02-19: 1000 mg via ORAL
  Filled 2024-02-19: qty 2

## 2024-02-19 NOTE — ED Notes (Addendum)
 Ambulate Pt: Pt reports dizziness when trying to get up. I recommended getting up slower. Pt was not able to ambulate without a walker. After using walker pt was able to ambulate successfully with good balance and steady gait. No complaints from pt while ambulating.

## 2024-02-19 NOTE — Evaluation (Signed)
 Physical Therapy Evaluation Patient Details Name: [name] [name] MRN: [id number] DOB: 1965/08/27 Today's Date: 02/19/2024  History of Present Illness  Pt admitted from home s/p falls with c/o back pain.  Pt with hx of DM, PTSD, PArkinsons, L knee osteoarthritis, bipolar tremor and Bells Palsy  Clinical Impression  Pt admitted as above and presenting with functional mobility limitations 2* generalized weakness, mild coordination deficits, and ambulatory balance deficits.  This date, pt ambulated 67' HHA but with increased shuffling, variable stride length, and noted instability requiring min assist to correct - pt states uses furniture to steady at home. Pt ambulated additional 170' with RW and CGA/Sup with occasional cues for posture and position from RW.  Pt would benefit from use of RW for home and follow up HHPT to maximize IND and safety at home.        If plan is discharge home, recommend the following: A little help with walking and/or transfers;A little help with bathing/dressing/bathroom;Assistance with cooking/housework;Assist for transportation;Help with stairs or ramp for entrance   Can travel by private vehicle   Yes    Equipment Recommendations Rolling walker (2 wheels)  Recommendations for Other Services       Functional Status Assessment Patient has had a recent decline in their functional status and demonstrates the ability to make significant improvements in function in a reasonable and predictable amount of time.     Precautions / Restrictions Precautions Precautions: Fall Restrictions Weight Bearing Restrictions Per Provider Order: No      Mobility  Bed Mobility Overal bed mobility: Needs Assistance Bed Mobility: Supine to Sit, Sit to Supine     Supine to sit: Min assist Sit to supine: Modified independent (Device/Increase time)   General bed mobility comments: min assist to bring trunk to upright on gurney - pt reports usually rolls to side to get up at  home but not enough room on narrow gurney    Transfers Overall transfer level: Needs assistance Equipment used: None Transfers: Sit to/from Stand Sit to Stand: Supervision           General transfer comment: min steady assist for safety; cues for use of UEs to self assist    Ambulation/Gait Ambulation/Gait assistance: Min assist, Contact guard assist Gait Distance (Feet): 200 Feet Assistive device: Rolling walker (2 wheels), 1 person hand held assist Gait Pattern/deviations: Step-through pattern, Decreased step length - right, Decreased step length - left, Shuffle, Trunk flexed       General Gait Details: Pt ambulated 30' HHA but with increased shuffling, variable stride length, and noted instability requiring min assist.  Pt ambulated additional 170' with RW and CGA/Sup and occasional cues for posture and position from RW.  Stairs            Wheelchair Mobility     Tilt Bed    Modified Rankin (Stroke Patients Only)       Balance Overall balance assessment: Needs assistance Sitting-balance support: No upper extremity supported, Feet supported Sitting balance-Leahy Scale: Good     Standing balance support: No upper extremity supported Standing balance-Leahy Scale: Fair                               Pertinent Vitals/Pain Pain Assessment Pain Assessment: 0-10 Pain Score: 8  Pain Location: back pain Pain Descriptors / Indicators: Aching, Sore Pain Intervention(s): Limited activity within patient's tolerance, Monitored during session    Home Living Family/patient expects to  be discharged to:: Private residence Living Arrangements: Other relatives;Other (Comment) Marcelle) Available Help at Discharge: Available 24 hours/day;Other (Comment) (but is not able to physically assist - Sari has an aide to assist her) Type of Home: House Home Access: Stairs to enter Entrance Stairs-Rails: Right;Left;Can reach both Secretary/administrator of Steps: 6    Home Layout: One level Home Equipment: None      Prior Function Prior Level of Function : History of Falls (last six months)             Mobility Comments: Pt states uses furniture to steady at home but has been planning to get a RW when I have the money       Extremity/Trunk Assessment   Upper Extremity Assessment Upper Extremity Assessment: RUE deficits/detail;LUE deficits/detail;Generalized weakness RUE Coordination: decreased fine motor LUE Coordination: decreased fine motor    Lower Extremity Assessment Lower Extremity Assessment: RLE deficits/detail;LLE deficits/detail RLE Coordination: decreased fine motor LLE Coordination: decreased fine motor       Communication   Communication Communication: Impaired Factors Affecting Communication: Reduced clarity of speech    Cognition Arousal: Alert Behavior During Therapy: WFL for tasks assessed/performed   PT - Cognitive impairments: No family/caregiver present to determine baseline                       PT - Cognition Comments: Pt with decreased clarity of speech Following commands: Intact       Cueing Cueing Techniques: Verbal cues     General Comments      Exercises     Assessment/Plan    PT Assessment Patient needs continued PT services  PT Problem List Decreased balance;Decreased mobility;Decreased knowledge of use of DME;Obesity;Pain       PT Treatment Interventions DME instruction;Gait training;Stair training;Functional mobility training;Therapeutic activities;Therapeutic exercise;Balance training;Patient/family education    PT Goals (Current goals can be found in the Care Plan section)  Acute Rehab PT Goals Patient Stated Goal: Decreased back pain; no more falls PT Goal Formulation: With patient Time For Goal Achievement: 03/04/24 Potential to Achieve Goals: Good    Frequency Min 3X/week     Co-evaluation               AM-PAC PT 6 Clicks Mobility  Outcome  Measure Help needed turning from your back to your side while in a flat bed without using bedrails?: None Help needed moving from lying on your back to sitting on the side of a flat bed without using bedrails?: A Little Help needed moving to and from a bed to a chair (including a wheelchair)?: A Little Help needed standing up from a chair using your arms (e.g., wheelchair or bedside chair)?: A Little Help needed to walk in hospital room?: A Little Help needed climbing 3-5 steps with a railing? : A Little 6 Click Score: 19    End of Session Equipment Utilized During Treatment: Gait belt Activity Tolerance: Patient tolerated treatment well Patient left: in bed;with call bell/phone within reach Nurse Communication: Mobility status PT Visit Diagnosis: Difficulty in walking, not elsewhere classified (R26.2);Pain;History of falling (Z91.81) Pain - part of body:  (low back)    Time: 1411-1434 PT Time Calculation (min) (ACUTE ONLY): 23 min   Charges:   PT Evaluation $PT Eval Low Complexity: 1 Low PT Treatments $Gait Training: 8-22 mins PT General Charges $$ ACUTE PT VISIT: 1 Visit         [facility] PT Acute Rehabilitation Services Office [phone]  Sohrab Keelan 02/19/2024, 3:40 PM

## 2024-02-19 NOTE — ED Provider Notes (Signed)
 [place] EMERGENCY DEPARTMENT AT [facility] Provider Note   CSN: [id number] Arrival date & time: 02/19/24  1911     Patient presents with: Tremors   [name] [name] is a 58 y.o. male.   Patient has a history of Parkinson's disease along with diabetes and osteoarthritis of his left knee.  Patient has had a lot of tremors and unsteadiness and he has been falling frequently.  He was evaluated earlier today for this and was sent home with a walker.  When he arrived at home his mother who understood the situation and told the people from PT are to take him back to the emergency department that he could not stay at the house anymore.  Patient would like nursing home placement if possible  The history is provided by the patient and medical records. No language interpreter was used.  Weakness Severity:  Mild Onset quality:  Gradual Timing:  Constant Progression:  Worsening Chronicity:  Recurrent Context: not alcohol use   Relieved by:  Nothing Worsened by:  Nothing Ineffective treatments: Use of a walker. Associated symptoms: no abdominal pain, no chest pain, no cough, no diarrhea, no frequency, no headaches and no seizures   Risk factors: no anemia        Prior to Admission medications   Medication Sig Start Date End Date Taking? Authorizing Provider  atorvastatin  (LIPITOR) 20 MG tablet TAKE 1 TABLET BY MOUTH DAILY 11/28/23   [name], [name]  benztropine  (COGENTIN ) 1 MG tablet Take 1 tablet (1 mg total) by mouth 2 (two) times daily. 01/26/24   Harl Zane BRAVO, NP  divalproex  (DEPAKOTE ) 500 MG DR tablet Take 1 tablet (500 mg total) by mouth 2 (two) times daily. 01/26/24   Harl Zane BRAVO, NP  DULoxetine  40 MG CPEP Take 2 capsules (80 mg total) by mouth 2 (two) times daily. 01/26/24   Harl Zane E, NP  glimepiride  (AMARYL ) 4 MG tablet TAKE 1 TABLET (4 MG TOTAL) BY MOUTH 2 (TWO) TIMES DAILY. 08/22/23   [name], [name]  hydrOXYzine  (ATARAX ) 50 MG tablet  TAKE 1 TABLET BY MOUTH FOUR TIMES A DAY & TAKE 2 TABLETS EVERY NIGHT AT BEDTIME FOR ANXIETY, PANIC, AGITATION, AND SLEEP Patient taking differently: Take 100 mg by mouth in the morning, at noon, in the evening, and at bedtime. 01/26/24   Harl Zane BRAVO, NP  lisinopril  (ZESTRIL ) 20 MG tablet TAKE 1 TABLET BY MOUTH DAILY 02/15/24   [name], [name]  metFORMIN  (GLUCOPHAGE ) 500 MG tablet TAKE 1 TABLET (500 MG TOTAL) BY MOUTH 2 (TWO) TIMES DAILY. 08/22/23   [name], [name]  propranolol  (INDERAL ) 10 MG tablet Take 1 tablet (10 mg total) by mouth 3 (three) times daily. 01/26/24   Harl Zane BRAVO, NP  sildenafil  (REVATIO ) 20 MG tablet Take 1 tablet (20 mg total) by mouth daily as needed (as directed for E.D.). 02/09/23   Duanne Butler DASEN, MD  valbenazine  (INGREZZA ) 80 MG capsule Take 1 capsule (80 mg total) by mouth daily. 01/26/24   Harl Zane BRAVO, NP    Allergies: Patient has no known allergies.    Review of Systems  Constitutional:  Negative for appetite change and fatigue.  HENT:  Negative for congestion, ear discharge and sinus pressure.   Eyes:  Negative for discharge.  Respiratory:  Negative for cough.   Cardiovascular:  Negative for chest pain.  Gastrointestinal:  Negative for abdominal pain and diarrhea.  Genitourinary:  Negative for frequency and  hematuria.  Musculoskeletal:  Negative for back pain.  Skin:  Negative for rash.  Neurological:  Positive for weakness. Negative for seizures and headaches.  Psychiatric/Behavioral:  Negative for hallucinations.     Updated Vital Signs BP (!) 156/89   Pulse 63   Temp 98.2 F (36.8 C) (Oral)   Resp 16   SpO2 95%   Physical Exam Vitals and nursing note reviewed.  Constitutional:      Appearance: He is well-developed.  HENT:     Head: Normocephalic.     Nose: Nose normal.  Eyes:     General: No scleral icterus.    Conjunctiva/sclera: Conjunctivae normal.  Neck:     Thyroid : No thyromegaly.  Cardiovascular:      Rate and Rhythm: Normal rate and regular rhythm.     Heart sounds: No murmur heard.    No friction rub. No gallop.  Pulmonary:     Breath sounds: No stridor. No wheezing or rales.  Chest:     Chest wall: No tenderness.  Abdominal:     General: There is no distension.     Tenderness: There is no abdominal tenderness. There is no rebound.  Musculoskeletal:        General: Normal range of motion.     Cervical back: Neck supple.  Lymphadenopathy:     Cervical: No cervical adenopathy.  Skin:    Findings: No erythema or rash.  Neurological:     Mental Status: He is alert and oriented to person, place, and time.     Motor: No abnormal muscle tone.     Coordination: Coordination normal.     Comments: Patient having tremors with both upper extremities.  He can walk with a walker  Psychiatric:        Behavior: Behavior normal.     (all labs ordered are listed, but only abnormal results are displayed) Labs Reviewed - No data to display  EKG: None  Radiology: CT Lumbar Spine Wo Contrast Result Date: 02/19/2024 EXAM: CT OF THE LUMBAR SPINE WITHOUT CONTRAST 02/19/2024 09:33:09 AM TECHNIQUE: CT of the lumbar spine was performed without the administration of intravenous contrast. Multiplanar reformatted images are provided for review. Automated exposure control, iterative reconstruction, and/or weight based adjustment of the mA/kV was utilized to reduce the radiation dose to as low as reasonably achievable. COMPARISON: CT abdomen and pelvis 05/31/2019. CLINICAL HISTORY: 58 year old male with back pain and recent falls. FINDINGS: BONES AND ALIGNMENT: Normal lumbar segmentation. Preserved lumbar lordosis. No significant scoliosis or spondylolisthesis. Maintained vertebral height. Visible sacrum and SI joints intact. No acute fracture or suspicious bone lesion. DEGENERATIVE CHANGES: Widespread bulky disc bulging and associated endplate spurring from the thoracolumbar junction to the lumbosacral  junction. Superimposed moderate lumbar facet arthropathy. Subsequent multilevel spinal and lateral recess stenosis most notable for: L2-L3: Mild to moderate spinal and moderate to severe bilateral lateral recess stenosis. L3-L4: Severe spinal and bilateral lateral recess stenosis, moderate to severe right L3 foraminal stenosis. L4-L5: Moderate spinal and lateral recess stenosis with severe left and moderate to severe right L4 neural foraminal stenosis. SABRA L5-S1: Moderate to severe right lateral recess and bilateral L5 neural foraminal stenosis. SOFT TISSUES: Negative lumbar paraspinal soft tissues. Bulky chronic left lower pole urinary calculus. Stable visible noncontrast abdominal and pelvic viscera. Mild calcified aortic atherosclerosis. IMPRESSION: 1. No acute traumatic injury identified in the lumbar spine. 2. Widespread bulky disc and endplate degeneration with facet arthropathy. Resulting in up to severe stenoses L3-L4 through L5-S1. 3. Chronic  left lower pole renal calculus. Electronically signed by: Helayne Hurst MD 02/19/2024 10:00 AM EDT RP Workstation: [id number]   CT Cervical Spine Wo Contrast Result Date: 02/19/2024 EXAM: CT CERVICAL SPINE WITHOUT CONTRAST 02/19/2024 09:33:09 AM TECHNIQUE: CT of the cervical spine was performed without the administration of intravenous contrast. Multiplanar reformatted images are provided for review. Automated exposure control, iterative reconstruction, and/or weight based adjustment of the mA/kV was utilized to reduce the radiation dose to as low as reasonably achievable. COMPARISON: Cervical spine CT 11/30/2023. CLINICAL HISTORY: 58 year old male with neck trauma, midline tenderness, and history of multiple falls due to back pain. FINDINGS: CERVICAL SPINE: BONES AND ALIGNMENT: Normal cervical lordosis. No acute fracture or traumatic malalignment. Subtle chronic T2 superior endplate compression appears stable and chronic. DEGENERATIVE CHANGES: Chronic disc and  endplate degeneration at C6-C7 with vacuum disc. Suspected mild spinal stenosis there by CT. SOFT TISSUES: No prevertebral soft tissue swelling. Calcified carotid atherosclerosis in the neck. IMPRESSION: 1. No acute traumatic injury identified in the cervical spine. 2. Chronic disc and endplate degeneration at C6-C7 with suspected mild spinal stenosis. Electronically signed by: Helayne Hurst MD 02/19/2024 09:54 AM EDT RP Workstation: [id number]   CT Head Wo Contrast Result Date: 02/19/2024 EXAM: CT HEAD WITHOUT CONTRAST 02/19/2024 09:33:09 AM TECHNIQUE: CT of the head was performed without the administration of intravenous contrast. Automated exposure control, iterative reconstruction, and/or weight based adjustment of the mA/kV was utilized to reduce the radiation dose to as low as reasonably achievable. COMPARISON: Head CT 11/30/2023. CLINICAL HISTORY: 58 year old male with moderate-severe head trauma, multiple falls, back pain, and history of tardive dyskinesia and arthritis. FINDINGS: BRAIN AND VENTRICLES: No acute traumatic injury identified in the imaged body part(s). Stable and negative for age noncontrast CT appearance of the brain. No acute hemorrhage. No evidence of acute infarct. No hydrocephalus. No extra-axial collection. No mass effect or midline shift. Stable brain volume. Gray white differentiation is normal for age. No suspicious intracranial vascular hyperdensity. Calcified atherosclerosis at the skull base. ORBITS: Rightward gaze. No acute abnormality. SINUSES: No acute abnormality. SOFT TISSUES AND SKULL: No acute soft tissue abnormality. No skull fracture. IMPRESSION: 1. No acute traumatic injury identified in the head. 2. Stable and negative for age noncontrast CT appearance of the brain. Electronically signed by: Helayne Hurst MD 02/19/2024 09:48 AM EDT RP Workstation: [id number]     Procedures   Medications Ordered in the ED - No data to display                                   Medical Decision Making  Parkinson's disease and frequent falls.  Patient would like placement     Final diagnoses:  None    ED Discharge Orders     None          Suzette Pac, MD 02/19/24 2122

## 2024-02-19 NOTE — ED Provider Notes (Signed)
.     Suzette Pac, MD 02/19/24 1622

## 2024-02-19 NOTE — Care Management (Addendum)
 Transition of Care [facility]) - Emergency Department Mini Assessment   Patient Details  Name: [name] [name] MRN: [id number] Date of Birth: 09/15/1965  Transition of Care [facility]) CM/SW Contact:    Corean JAYSON Canary, RN Phone Number: 02/19/2024, 11:41 AM   Clinical Narrative: Patient presented to ED complaining of back pain having trouble ambulating. He ambulated much better with a walker. Walker ordered from [facility] to use at home. Awaiting delivery. Team updated [employer] does not take the patient's insurance, called [employer] to process and deliver the walker.  1530 PT saw the patient and recommending home health, also family wants social work help for housing, he currently lives with a in law, who needs care herself. Inquired to make sure walker was at bedside PT recommending home health,  Messaged [employer] to see if they could cover for PT , OT aide. [facility] messaged back and  will accept for PT and OT made team aware Verified that walker is at bedside.  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   Type 2 diabetes mellitus with hyperglycemia, without long-term current use of insulin  (HCC) 09/01/2022   Essential hypertension 09/01/2022   Mixed hyperlipidemia 09/01/2022   Tremor 09/01/2022   Bipolar I disorder, most recent episode depressed (HCC) 10/21/2021   Generalized anxiety disorder 01/23/2020   Moderate episode of recurrent major depressive disorder (HCC) 01/23/2020   Primary osteoarthritis of right knee 10/31/2019   MDD (major depressive disorder) 10/24/2019   PTSD (post-traumatic stress disorder)  10/24/2019   PCP:  [name], [name] Pharmacy:   [facility], [place] - [address] [place] [address] Phone: [phone] Fax: [phone]  [facility] [address] [place] [address] Phone: [phone] Fax: [phone]  [id number] [place], [place] - [address] [place] [address] Phone: [phone] Fax: [phone]

## 2024-02-19 NOTE — ED Triage Notes (Addendum)
 Pt came in via EMS from home w/ c/o of back pain. Pt states he has 5 falls recently due to the pain in his back. Usually takes Ibuprofen to relieve pain, but recently that has not been working. Denies hitting head or LOC. 0 thinners; no longer takes aspirin . Hx of tardive dyskinesia and arthritis.

## 2024-02-19 NOTE — ED Notes (Signed)
 Provided patient with beverage and sand which at this time

## 2024-02-19 NOTE — ED Triage Notes (Signed)
 Pt arrives from home w/ PTAR. Pt was dc earlier today but person pt was living w/ reports they do not feel comfortable w/ him returning there due to his tremors & hallucinations. Obvious tremors in triage. Pt denies alcohol or drug usage. A&O x 4

## 2024-02-19 NOTE — Discharge Instructions (Addendum)
 Please take Tylenol  and naproxen for pain control.  Follow-up with primary care.  Use walker when ambulating.  Return to emergency room with new or worsening symptoms.

## 2024-02-19 NOTE — ED Provider Notes (Signed)
 [place] EMERGENCY DEPARTMENT AT [facility] Provider Note   CSN: [id number] Arrival date & time: 02/19/24  9463     Patient presents with: Back Pain   [name] [name] is a 58 y.o. male patient with past medical history of Parkinson's disease, depression, hypertension, hyperlipidemia, obesity presents to emergency room with complaint of falls.  Patient reports that he has had 5 falls recently due to his baseline tremor associated with back pain.  He locates his back pain to his lower left side.  He denies any saddle anesthesia or loss of bowel or bladder.  He has no history of cancer or IV drug use.  He has no fever.  He does also note that he has had some generalized weakness lately.  He denies any specific vision change, confusion, difficulty with speech or numbness or tingling in extremities.  He reports this typically will get better with ibuprofen but seems worse.  He does note he has had mild headache but denies any head trauma during fall.  He is not on a blood thinner.    Back Pain      Prior to Admission medications   Medication Sig Start Date End Date Taking? Authorizing Provider  aspirin  EC 81 MG tablet Take 81 mg by mouth daily.    [provider]  atorvastatin  (LIPITOR) 20 MG tablet TAKE 1 TABLET BY MOUTH DAILY 11/28/23   Howard, Amber S, [name]  benztropine  (COGENTIN ) 1 MG tablet Take 1 tablet (1 mg total) by mouth 2 (two) times daily. 01/26/24   Parsons, Brittney E, NP  Cholecalciferol (VITAMIN D3) 1000 units CAPS Take 1,000 Units by mouth daily.    [provider]  divalproex  (DEPAKOTE ) 500 MG DR tablet Take 1 tablet (500 mg total) by mouth 2 (two) times daily. 01/26/24   Harl Zane BRAVO, NP  DULoxetine  40 MG CPEP Take 2 capsules (80 mg total) by mouth 2 (two) times daily. 01/26/24   Harl Zane E, NP  glimepiride  (AMARYL ) 4 MG tablet TAKE 1 TABLET (4 MG TOTAL) BY MOUTH 2 (TWO) TIMES DAILY. 08/22/23   [name], [name]  hydrOXYzine   (ATARAX ) 50 MG tablet TAKE 1 TABLET BY MOUTH FOUR TIMES A DAY & TAKE 2 TABLETS EVERY NIGHT AT BEDTIME FOR ANXIETY, PANIC, AGITATION, AND SLEEP 01/26/24   Harl Zane E, NP  lisinopril  (ZESTRIL ) 20 MG tablet TAKE 1 TABLET BY MOUTH DAILY 02/15/24   [name], [name]  meloxicam  (MOBIC ) 7.5 MG tablet Take 1 tablet (7.5 mg total) by mouth daily. 10/13/22   [name], [name]  metFORMIN  (GLUCOPHAGE ) 500 MG tablet TAKE 1 TABLET (500 MG TOTAL) BY MOUTH 2 (TWO) TIMES DAILY. 08/22/23   [name], [name]  omeprazole (PRILOSEC OTC) 20 MG tablet Take 20 mg by mouth daily as needed (for reflux).    [provider]  propranolol  (INDERAL ) 10 MG tablet Take 1 tablet (10 mg total) by mouth 3 (three) times daily. 01/26/24   Harl Zane BRAVO, NP  sildenafil  (REVATIO ) 20 MG tablet Take 1 tablet (20 mg total) by mouth daily as needed (as directed for E.D.). 02/09/23   Duanne Butler DASEN, MD  valbenazine  (INGREZZA ) 80 MG capsule Take 1 capsule (80 mg total) by mouth daily. 01/26/24   Harl Zane BRAVO, NP    Allergies: Patient has no known allergies.    Review of Systems  Musculoskeletal:  Positive for back pain.    Updated Vital Signs BP (!) 110/56 (BP Location:  Right Arm)   Pulse 67   Temp 98.2 F (36.8 C) (Oral)   Resp 18   SpO2 96%   Physical Exam Vitals and nursing note reviewed.  Constitutional:      General: He is not in acute distress.    Appearance: He is not toxic-appearing.  HENT:     Head: Normocephalic and atraumatic.  Eyes:     General: No scleral icterus.    Conjunctiva/sclera: Conjunctivae normal.  Neck:     Comments: Some tenderness over left lateral aspect of neck.  No cervical midline tenderness step-off or deformity.  Normal range of motion of neck.  No radicular symptoms into upper extremity. Cardiovascular:     Rate and Rhythm: Normal rate and regular rhythm.     Pulses: Normal pulses.     Heart sounds: Normal heart sounds.  Pulmonary:     Effort: Pulmonary  effort is normal. No respiratory distress.     Breath sounds: Normal breath sounds.  Abdominal:     General: Abdomen is flat. Bowel sounds are normal.     Palpations: Abdomen is soft.     Tenderness: There is no abdominal tenderness.  Musculoskeletal:       Arms:     Right lower leg: No edema.     Left lower leg: No edema.     Comments: 5 out of 5 strength of lower extremity.  Sensation intact.  Strong dorsal pedal pulse.  Skin:    General: Skin is warm and dry.     Findings: No lesion.  Neurological:     General: No focal deficit present.     Mental Status: He is alert and oriented to person, place, and time. Mental status is at baseline.     Comments: No focal neurological deficit on exam.     (all labs ordered are listed, but only abnormal results are displayed) Labs Reviewed  COMPREHENSIVE METABOLIC PANEL WITH GFR - Abnormal; Notable for the following components:      Result Value   Glucose, Bld 122 (*)    All other components within normal limits  URINALYSIS, ROUTINE W REFLEX MICROSCOPIC  CBC WITH DIFFERENTIAL/PLATELET  CBC WITH DIFFERENTIAL/PLATELET    EKG: None  Radiology: CT Lumbar Spine Wo Contrast Result Date: 02/19/2024 EXAM: CT OF THE LUMBAR SPINE WITHOUT CONTRAST 02/19/2024 09:33:09 AM TECHNIQUE: CT of the lumbar spine was performed without the administration of intravenous contrast. Multiplanar reformatted images are provided for review. Automated exposure control, iterative reconstruction, and/or weight based adjustment of the mA/kV was utilized to reduce the radiation dose to as low as reasonably achievable. COMPARISON: CT abdomen and pelvis 05/31/2019. CLINICAL HISTORY: 58 year old male with back pain and recent falls. FINDINGS: BONES AND ALIGNMENT: Normal lumbar segmentation. Preserved lumbar lordosis. No significant scoliosis or spondylolisthesis. Maintained vertebral height. Visible sacrum and SI joints intact. No acute fracture or suspicious bone lesion.  DEGENERATIVE CHANGES: Widespread bulky disc bulging and associated endplate spurring from the thoracolumbar junction to the lumbosacral junction. Superimposed moderate lumbar facet arthropathy. Subsequent multilevel spinal and lateral recess stenosis most notable for: L2-L3: Mild to moderate spinal and moderate to severe bilateral lateral recess stenosis. L3-L4: Severe spinal and bilateral lateral recess stenosis, moderate to severe right L3 foraminal stenosis. L4-L5: Moderate spinal and lateral recess stenosis with severe left and moderate to severe right L4 neural foraminal stenosis. SABRA L5-S1: Moderate to severe right lateral recess and bilateral L5 neural foraminal stenosis. SOFT TISSUES: Negative lumbar paraspinal soft tissues. Bulky chronic left  lower pole urinary calculus. Stable visible noncontrast abdominal and pelvic viscera. Mild calcified aortic atherosclerosis. IMPRESSION: 1. No acute traumatic injury identified in the lumbar spine. 2. Widespread bulky disc and endplate degeneration with facet arthropathy. Resulting in up to severe stenoses L3-L4 through L5-S1. 3. Chronic left lower pole renal calculus. Electronically signed by: Helayne Hurst MD 02/19/2024 10:00 AM EDT RP Workstation: [id number]   CT Cervical Spine Wo Contrast Result Date: 02/19/2024 EXAM: CT CERVICAL SPINE WITHOUT CONTRAST 02/19/2024 09:33:09 AM TECHNIQUE: CT of the cervical spine was performed without the administration of intravenous contrast. Multiplanar reformatted images are provided for review. Automated exposure control, iterative reconstruction, and/or weight based adjustment of the mA/kV was utilized to reduce the radiation dose to as low as reasonably achievable. COMPARISON: Cervical spine CT 11/30/2023. CLINICAL HISTORY: 58 year old male with neck trauma, midline tenderness, and history of multiple falls due to back pain. FINDINGS: CERVICAL SPINE: BONES AND ALIGNMENT: Normal cervical lordosis. No acute fracture or traumatic  malalignment. Subtle chronic T2 superior endplate compression appears stable and chronic. DEGENERATIVE CHANGES: Chronic disc and endplate degeneration at C6-C7 with vacuum disc. Suspected mild spinal stenosis there by CT. SOFT TISSUES: No prevertebral soft tissue swelling. Calcified carotid atherosclerosis in the neck. IMPRESSION: 1. No acute traumatic injury identified in the cervical spine. 2. Chronic disc and endplate degeneration at C6-C7 with suspected mild spinal stenosis. Electronically signed by: Helayne Hurst MD 02/19/2024 09:54 AM EDT RP Workstation: [id number]   CT Head Wo Contrast Result Date: 02/19/2024 EXAM: CT HEAD WITHOUT CONTRAST 02/19/2024 09:33:09 AM TECHNIQUE: CT of the head was performed without the administration of intravenous contrast. Automated exposure control, iterative reconstruction, and/or weight based adjustment of the mA/kV was utilized to reduce the radiation dose to as low as reasonably achievable. COMPARISON: Head CT 11/30/2023. CLINICAL HISTORY: 58 year old male with moderate-severe head trauma, multiple falls, back pain, and history of tardive dyskinesia and arthritis. FINDINGS: BRAIN AND VENTRICLES: No acute traumatic injury identified in the imaged body part(s). Stable and negative for age noncontrast CT appearance of the brain. No acute hemorrhage. No evidence of acute infarct. No hydrocephalus. No extra-axial collection. No mass effect or midline shift. Stable brain volume. Gray white differentiation is normal for age. No suspicious intracranial vascular hyperdensity. Calcified atherosclerosis at the skull base. ORBITS: Rightward gaze. No acute abnormality. SINUSES: No acute abnormality. SOFT TISSUES AND SKULL: No acute soft tissue abnormality. No skull fracture. IMPRESSION: 1. No acute traumatic injury identified in the head. 2. Stable and negative for age noncontrast CT appearance of the brain. Electronically signed by: Helayne Hurst MD 02/19/2024 09:48 AM EDT RP  Workstation: [id number]     Procedures   Medications Ordered in the ED  sodium chloride  0.9 % bolus 500 mL (500 mLs Intravenous New Bag/Given 02/19/24 0826)  acetaminophen  (TYLENOL ) tablet 1,000 mg (1,000 mg Oral Given 02/19/24 9173)                                    Medical Decision Making Amount and/or Complexity of Data Reviewed Labs: ordered. Radiology: ordered.  Risk OTC drugs.   This patient presents to the ED for concern of low back pain, this involves an extensive number of treatment options, and is a complaint that carries with it a high risk of complications and morbidity.  The differential diagnosis includes MSK in nature, fracture, epidural hematoma/abscess, cauda equina syndrome, spinal stenosis, spinal malignancy, discitis, spinal infection, spondylitises/ spondylosis,  conus medullaris, DDD of the back, intracranial hemorrhage, subdural/epidural hematoma, vertebral fracture, spinal cord injury, muscle strain, skull fracture, fracture, splenic injury, liver injury, perforated viscus, contusions.  Lab Tests:  I personally interpreted labs.  The pertinent results include:   CBC, CMP unremarkable.  Negative UA   Imaging Studies ordered:  I ordered imaging studies including CT scan of head, cervical spine and lumbar spine I independently visualized and interpreted imaging which showed no acute findings. I agree with the radiologist interpretation   Cardiac Monitoring: / EKG:  The patient was maintained on a cardiac monitor.     Problem List / ED Course / Critical interventions / Medication management  Patient is presenting to emergency room with complaint of lower back pain and frequent falls.  I did check basic labs and EKG due to his frequent falls however this is unremarkable.  He reports today he had a fall but was able to slowly lowered himself to the ground.  This exacerbated his low back pain.  He has no red flag symptoms associated with his back pain.   On my exam he has no midline tenderness.  He has no radicular symptoms.  Strength of his lower extremity is intact.  I did obtain imaging of the head and cervical spine which shows no acute findings.  There is no acute findings on lumbar spine. Rule out ddx including abscess, cauda equina syndrome, malignancy less likely given history of present illness. No red flag symptoms indication need for emergent MR at this time.   I ordered medication including , Tylenol   Reevaluation of the patient after these medicines showed that the patient improved I have reviewed the patients home medicines and have made adjustments as needed. Patient felt better after treatment here however still felt unsteady on feet without using a walker due to his back pain.  Patient did feel better after using walker.  Will consult to PT OT and social work for walker.  Anticipate that after patient has been evaluated she will be stable for discharge home.        Final diagnoses:  Chronic bilateral low back pain without sciatica    ED Discharge Orders     None          Shermon Warren SAILOR, PA-C 02/19/24 1241    Charlyn Sora, MD 02/19/24 1350

## 2024-02-20 MED ORDER — HYDROXYZINE HCL 25 MG PO TABS: 100.0000 mg | ORAL_TABLET | Freq: Four times a day (QID) | ORAL | Status: DC | PRN

## 2024-02-20 MED ORDER — ATORVASTATIN CALCIUM 10 MG PO TABS
20.0000 mg | ORAL_TABLET | Freq: Every day | ORAL | Status: DC
  Administered 2024-02-20: 20 mg via ORAL
  Filled 2024-02-20: qty 2

## 2024-02-20 MED ORDER — METFORMIN HCL 500 MG PO TABS
500.0000 mg | ORAL_TABLET | Freq: Two times a day (BID) | ORAL | Status: DC
  Administered 2024-02-20: 500 mg via ORAL
  Filled 2024-02-20: qty 1

## 2024-02-20 MED ORDER — DIVALPROEX SODIUM 500 MG PO DR TAB
500.0000 mg | DELAYED_RELEASE_TABLET | Freq: Two times a day (BID) | ORAL | Status: DC
Start: 2024-02-20 — End: 2024-02-20
  Administered 2024-02-20: 500 mg via ORAL
  Filled 2024-02-20: qty 1

## 2024-02-20 MED ORDER — BENZTROPINE MESYLATE 1 MG PO TABS
1.0000 mg | ORAL_TABLET | Freq: Two times a day (BID) | ORAL | Status: DC
  Administered 2024-02-20: 1 mg via ORAL
  Filled 2024-02-20: qty 1

## 2024-02-20 MED ORDER — GLIMEPIRIDE 4 MG PO TABS
4.0000 mg | ORAL_TABLET | Freq: Two times a day (BID) | ORAL | Status: DC
  Administered 2024-02-20: 4 mg via ORAL
  Filled 2024-02-20 (×2): qty 1

## 2024-02-20 MED ORDER — PROPRANOLOL HCL 20 MG PO TABS
10.0000 mg | ORAL_TABLET | Freq: Three times a day (TID) | ORAL | Status: DC
  Administered 2024-02-20: 10 mg via ORAL
  Filled 2024-02-20: qty 1

## 2024-02-20 MED ORDER — DULOXETINE HCL 20 MG PO CPEP
40.0000 mg | ORAL_CAPSULE | Freq: Two times a day (BID) | ORAL | Status: DC
Start: 2024-02-20 — End: 2024-02-20
  Filled 2024-02-20: qty 2

## 2024-02-20 MED ORDER — LISINOPRIL 20 MG PO TABS
20.0000 mg | ORAL_TABLET | Freq: Every day | ORAL | Status: DC
  Administered 2024-02-20: 20 mg via ORAL
  Filled 2024-02-20: qty 1

## 2024-02-20 MED ORDER — VALBENAZINE TOSYLATE 40 MG PO CAPS
80.0000 mg | ORAL_CAPSULE | Freq: Every day | ORAL | Status: DC
  Administered 2024-02-20: 80 mg via ORAL
  Filled 2024-02-20: qty 2

## 2024-02-20 NOTE — ED Provider Notes (Signed)
 Emergency Medicine Observation Re-evaluation Note  [name] [name] is a 58 y.o. male, seen on rounds today.  Pt initially presented to the ED for complaints of Tremors Currently, the patient is resting.  Physical Exam  BP (!) 143/82 (BP Location: Right Arm)   Pulse 73   Temp 98.5 F (36.9 C) (Oral)   Resp 16   SpO2 97%  Physical Exam  ED Course / MDM  EKG:   I have reviewed the labs performed to date as well as medications administered while in observation.  Recent changes in the last 24 hours include none.  Plan  Current plan is for evaluation by TOC and PT likely placement.    Dasie Faden, MD 02/20/24 [phone]

## 2024-02-20 NOTE — Progress Notes (Signed)
 Physical Therapy Treatment Patient Details Name: [name] [name] MRN: [id number] DOB: Nov 14, 1965 Today's Date: 02/20/2024   History of Present Illness Pt admitted from home s/p falls with c/o back pain.  Pt with hx of DM, PTSD, PArkinsons, L knee osteoarthritis, bipolar tremor and Bells Palsy    PT Comments  Pt is progressing toward goals. Decr assist required overall today compared to PT eval; pt is supervision for transfers, amb  180' with RW transition to rollator with CGA fade to supervision during gait.  No LOB. Pt continues to complain of back pain which is unchanged/not aggravated by mobility per pt. Pt reports he has been seeing people talking to people not in room this am and sitter confirms this.  Pt is oriented and abel to follow commands at time of PT session. D/c plan remains appropriate given progress this session, HHPT.  Continue to follow in acute setting   If plan is discharge home, recommend the following: A little help with walking and/or transfers;A little help with bathing/dressing/bathroom;Assistance with cooking/housework;Assist for transportation;Help with stairs or ramp for entrance   Can travel by private vehicle     Yes  Equipment Recommendations  Rolling walker (2 wheels)    Recommendations for Other Services       Precautions / Restrictions Precautions Precautions: Fall Restrictions Weight Bearing Restrictions Per Provider Order: No     Mobility  Bed Mobility Overal bed mobility: Needs Assistance Bed Mobility: Supine to Sit, Sit to Supine     Supine to sit: Contact guard Sit to supine: Modified independent (Device/Increase time)   General bed mobility comments: cues to roll to R side to ease back pain. CGA to elevate trunk; no physical assist to return to supine, bed flat    Transfers Overall transfer level: Needs assistance Equipment used: None Transfers: Sit to/from Stand Sit to Stand: Supervision           General transfer comment:  no physical assist. cues hand placement and safety; reviewed safety and practiced STS to/from rollator    Ambulation/Gait Ambulation/Gait assistance: Contact guard assist, Supervision Gait Distance (Feet): 180 Feet Assistive device: Rolling walker (2 wheels), Rollator (4 wheels) Gait Pattern/deviations: Step-through pattern, Trunk flexed, Decreased stride length       General Gait Details: pt amb ~ 45' with RW, CGA to supervision, transitioned to rollator and pt continued to amb 150' more with supervision. no LOB, tremors present but did not cause LOB. cues for rollator features and safety   Stairs             Wheelchair Mobility     Tilt Bed    Modified Rankin (Stroke Patients Only)       Balance Overall balance assessment: Needs assistance Sitting-balance support: No upper extremity supported, Feet supported Sitting balance-Leahy Scale: Good     Standing balance support: No upper extremity supported Standing balance-Leahy Scale: Fair                              Hotel manager: No apparent difficulties  Cognition Arousal: Alert Behavior During Therapy: WFL for tasks assessed/performed   PT - Cognitive impairments: No family/caregiver present to determine baseline                         Following commands: Intact      Cueing Cueing Techniques: Verbal cues  Exercises      General Comments  Pertinent Vitals/Pain Pain Assessment Pain Location: back pain Pain Descriptors / Indicators: Aching, Sore    Home Living Family/patient expects to be discharged to:: Private residence                        Prior Function            PT Goals (current goals can now be found in the care plan section) Acute Rehab PT Goals Patient Stated Goal: Decreased back pain; no more falls PT Goal Formulation: With patient Time For Goal Achievement: 03/04/24 Potential to Achieve Goals: Good Progress  towards PT goals: Progressing toward goals    Frequency    Min 3X/week      PT Plan      Co-evaluation              AM-PAC PT 6 Clicks Mobility   Outcome Measure  Help needed turning from your back to your side while in a flat bed without using bedrails?: None Help needed moving from lying on your back to sitting on the side of a flat bed without using bedrails?: A Little Help needed moving to and from a bed to a chair (including a wheelchair)?: A Little Help needed standing up from a chair using your arms (e.g., wheelchair or bedside chair)?: A Little Help needed to walk in hospital room?: A Little Help needed climbing 3-5 steps with a railing? : A Little 6 Click Score: 19    End of Session Equipment Utilized During Treatment: Gait belt Activity Tolerance: Patient tolerated treatment well Patient left: in bed;with call bell/phone within reach;with bed alarm set;with nursing/sitter in room Nurse Communication: Mobility status PT Visit Diagnosis: Difficulty in walking, not elsewhere classified (R26.2);Pain;History of falling (Z91.81) Pain - part of body:  (low back)     Time: 9079-9062 PT Time Calculation (min) (ACUTE ONLY): 17 min  Charges:    $Gait Training: 8-22 mins PT General Charges $$ ACUTE PT VISIT: 1 Visit                     Jeison Delpilar, PT  Acute Rehab Dept [facility]) [phone]  02/20/2024    [facility] 02/20/2024, 9:57 AM

## 2024-02-20 NOTE — Progress Notes (Signed)
 Per RN, Family called stating they were sending a cab for him and pt has discharged.

## 2024-02-20 NOTE — Progress Notes (Addendum)
 CSW spoke with pt who informed he does not yet have disability but reports the latest update from 2 weeks ago is that a judge will make a final decision in 2-4 months. CSW informed pt that [name] is reporting he is unable to return to her home and inquired about whether there is anyone else he's able to stay with-- pt reported there is not. He states he took care of himself, the dog, and Wendi but states she was talking out of her head the last time he came into the ER.   CSW spoke with [place] who states pt has lived with her for 2-3 years. CSW inquired about whether she has provided pt with a 30 day eviction notice and she states Not yet. I have someone I'm working with on that. She reported pt has been weird and that is why she does not wish for him to return to her home. She states she felt bad for the pt after his brother put him out and his girlfriend dumped him which is how he began to live with her.   CSW noted pt is under the age of 1 and would need to be deemed disabled in order for his Medicaid to convert to LTC. Pt is recommended for [facility] PT only which was arranged by [facility] on 10/12. CSW will notified ICM supervisors to determine safe disposition.

## 2024-02-22 ENCOUNTER — Ambulatory Visit: Payer: Self-pay

## 2024-02-22 NOTE — Telephone Encounter (Signed)
 This RN attempted to reach pt regarding dropped call and ED refusal. VM left to CB  Disconnected, placed in call backs. If he returns the call, we can advise him that the recommendation remains that he return to the ED and that [facility] confirmed such and/or we can warm transfer him to [facility].

## 2024-02-22 NOTE — Telephone Encounter (Signed)
 Disconnected, placed in call backs. If he returns the call, we can advise him that the recommendation remains that he return to the ED and that [facility] confirmed such and/or we can warm transfer him to [facility]. ---------------------------------------------------  FYI Only or Action Required?: Action required by provider: update on patient condition.  Patient was last seen in primary care on 01/10/2024 by [name], [name].  Called Nurse Triage reporting Fall and Headache.  Symptoms began yesterday.  Interventions attempted: Rest, hydration, or home remedies.  Symptoms are: gradually worsening.  Triage Disposition: Go to ED Now (Notify PCP)  Patient/caregiver understands and will follow disposition?: No, refuses disposition  Reason for Disposition  Injury (or injuries) that need emergency care  Answer Assessment - Initial Assessment Questions Pt with hx of parkinson's reports recent falls. States falls were d/t loss of balance, believes was d/t Parkinsons. States tremors have increased.   Patient reports 5 falls, all occurred 5 days ago. Was seen in the ED for same on 02/19/24. Patient fell out of bed again last night and struck his head on the floor. Patient confirms that he does not live alone and that he has a bed rail.   Pt reports headache that began today and back pain x 5 days. Rates headache as a 5/10, denies vision changes, reports weakness and numbness to lower right back when bending over. Denies weakness or numbness in extremities or face.  Patient refused 911/EMS and refused ED. Rationales including multiple recent head strike and new onset headache were provided by this RN and patient refused 911/EMS or ED again.   This RN called [facility] and spoke with staffmember who stated that they have no available openings and confirmed pt should go to the ED. Attempted to transfer pt to [facility] and he was disconnected. Called pt and he requested a call at [phone]. This RN attempted to reach  him at that number and left a VM requesting a call back.   If he returns the call, we can advise him that the recommendation remains that he return to the ED and that [facility] confirmed such and/or we can warm transfer him to [facility].  1. MECHANISM: How did the fall happen?     Fall from bed to floor, struck head on the floor   2. ONSET: When did the fall happen? (e.g., minutes, hours, or days ago)     Fall was 02/21/24 with previous falls on 02/19/24  3. LOCATION: What part of the body hit the ground? (e.g., back, buttocks, head, hips, knees, hands, head, stomach)     Head  4. INJURY: Did you hurt (injure) yourself when you fell? If Yes, ask: What did you injure? Tell me more about this? (e.g., body area; type of injury; pain severity)     Yes, headache and back pain  5. CAUSE: What do you think caused the fall (or falling)? (e.g., dizzy spell, tripped)       Parkinsons  Protocols used: Falls and [facility] Copied from CRM [id number]. Topic: Clinical - Red Word Triage >> Feb 22, 2024 11:58 AM Mia F wrote: Red Word that prompted transfer to Nurse Triage: Pt fell and hurt his back and now is in a lot of pain. He fell 5 times since Monday. He went to the ER but they did not see anything. He was sent home but pain has been worsening since.

## 2024-02-25 ENCOUNTER — Emergency Department (HOSPITAL_COMMUNITY)
Admission: EM | Admit: 2024-02-25 | Discharge: 2024-03-02 | Disposition: A | Discharge status: Home / Self Care | Attending: Emergency Medicine | Admitting: Emergency Medicine

## 2024-02-25 ENCOUNTER — Other Ambulatory Visit: Payer: Self-pay

## 2024-02-25 ENCOUNTER — Emergency Department (HOSPITAL_COMMUNITY)

## 2024-02-25 DIAGNOSIS — F29 Unspecified psychosis not due to a substance or known physiological condition: Secondary | ICD-10-CM | POA: Diagnosis not present

## 2024-02-25 DIAGNOSIS — G20A1 Parkinson's disease without dyskinesia, without mention of fluctuations: Secondary | ICD-10-CM | POA: Diagnosis not present

## 2024-02-25 DIAGNOSIS — W19XXXA Unspecified fall, initial encounter: Secondary | ICD-10-CM | POA: Diagnosis not present

## 2024-02-25 DIAGNOSIS — F99 Mental disorder, not otherwise specified: Secondary | ICD-10-CM

## 2024-02-25 DIAGNOSIS — R531 Weakness: Secondary | ICD-10-CM | POA: Insufficient documentation

## 2024-02-25 DIAGNOSIS — R4182 Altered mental status, unspecified: Secondary | ICD-10-CM | POA: Diagnosis not present

## 2024-02-25 DIAGNOSIS — F3130 Bipolar disorder, current episode depressed, mild or moderate severity, unspecified: Secondary | ICD-10-CM | POA: Insufficient documentation

## 2024-02-25 DIAGNOSIS — G2401 Drug induced subacute dyskinesia: Secondary | ICD-10-CM | POA: Insufficient documentation

## 2024-02-25 DIAGNOSIS — F319 Bipolar disorder, unspecified: Secondary | ICD-10-CM | POA: Diagnosis not present

## 2024-02-25 DIAGNOSIS — T50995A Adverse effect of other drugs, medicaments and biological substances, initial encounter: Secondary | ICD-10-CM | POA: Diagnosis not present

## 2024-02-25 DIAGNOSIS — M545 Low back pain, unspecified: Secondary | ICD-10-CM | POA: Insufficient documentation

## 2024-02-25 DIAGNOSIS — R462 Strange and inexplicable behavior: Secondary | ICD-10-CM | POA: Diagnosis present

## 2024-02-25 LAB — CBC
HCT: 48.3 % (ref 39.0–52.0)
Hemoglobin: 16.4 g/dL (ref 13.0–17.0)
MCH: 28.6 pg (ref 26.0–34.0)
MCHC: 34 g/dL (ref 30.0–36.0)
MCV: 84.3 fL (ref 80.0–100.0)
Platelets: 288 K/uL (ref 150–400)
RBC: 5.73 MIL/uL (ref 4.22–5.81)
RDW: 14 % (ref 11.5–15.5)
WBC: 9.7 K/uL (ref 4.0–10.5)
nRBC: 0 % (ref 0.0–0.2)

## 2024-02-25 LAB — URINALYSIS, ROUTINE W REFLEX MICROSCOPIC
Bilirubin Urine: NEGATIVE
Glucose, UA: NEGATIVE mg/dL
Hgb urine dipstick: NEGATIVE
Ketones, ur: 5 mg/dL — AB
Leukocytes,Ua: NEGATIVE
Nitrite: NEGATIVE
Protein, ur: NEGATIVE mg/dL
Specific Gravity, Urine: 1.018 (ref 1.005–1.030)
pH: 5 (ref 5.0–8.0)

## 2024-02-25 LAB — COMPREHENSIVE METABOLIC PANEL WITH GFR
ALT: 17 U/L (ref 0–44)
AST: 18 U/L (ref 15–41)
Albumin: 3.9 g/dL (ref 3.5–5.0)
Alkaline Phosphatase: 57 U/L (ref 38–126)
Anion gap: 13 (ref 5–15)
BUN: 17 mg/dL (ref 6–20)
CO2: 23 mmol/L (ref 22–32)
Calcium: 9.8 mg/dL (ref 8.9–10.3)
Chloride: 99 mmol/L (ref 98–111)
Creatinine, Ser: 1.19 mg/dL (ref 0.61–1.24)
GFR, Estimated: >60 mL/min (ref >60)
Glucose, Bld: 106 mg/dL — ABNORMAL HIGH (ref 70–99)
Potassium: 4.2 mmol/L (ref 3.5–5.1)
Sodium: 135 mmol/L (ref 135–145)
Total Bilirubin: 1.2 mg/dL (ref 0.0–1.2)
Total Protein: 7.3 g/dL (ref 6.5–8.1)

## 2024-02-25 LAB — CBG MONITORING, ED: Glucose-Capillary: 102 mg/dL — ABNORMAL HIGH (ref 70–99)

## 2024-02-25 LAB — RESP PANEL BY RT-PCR (RSV, FLU A&B, COVID)  RVPGX2
Influenza A by PCR: NEGATIVE
Influenza B by PCR: NEGATIVE
Resp Syncytial Virus by PCR: NEGATIVE
SARS Coronavirus 2 by RT PCR: NEGATIVE

## 2024-02-25 LAB — AMMONIA: Ammonia: 34 umol/L (ref 9–35)

## 2024-02-25 LAB — ETHANOL: Alcohol, Ethyl (B): <15 mg/dL (ref <15)

## 2024-02-25 MED ORDER — LISINOPRIL 20 MG PO TABS
20.0000 mg | ORAL_TABLET | Freq: Every day | ORAL | Status: DC
  Administered 2024-02-26 – 2024-03-02 (×6): 20 mg via ORAL
  Filled 2024-02-25 (×6): qty 1

## 2024-02-25 MED ORDER — HYDROXYZINE HCL 25 MG PO TABS
25.0000 mg | ORAL_TABLET | Freq: Three times a day (TID) | ORAL | Status: DC | PRN
Start: 2024-02-25 — End: 2024-03-02
  Administered 2024-02-27 – 2024-03-01 (×3): 25 mg via ORAL
  Filled 2024-02-25 (×3): qty 1

## 2024-02-25 MED ORDER — GLIMEPIRIDE 4 MG PO TABS
4.0000 mg | ORAL_TABLET | Freq: Two times a day (BID) | ORAL | Status: DC
Start: 2024-02-26 — End: 2024-03-02
  Administered 2024-02-26 – 2024-03-02 (×11): 4 mg via ORAL
  Filled 2024-02-25 (×14): qty 1

## 2024-02-25 MED ORDER — DIVALPROEX SODIUM 500 MG PO DR TAB
500.0000 mg | DELAYED_RELEASE_TABLET | Freq: Two times a day (BID) | ORAL | Status: DC
  Administered 2024-02-25 – 2024-03-02 (×12): 500 mg via ORAL
  Filled 2024-02-25: qty 1
  Filled 2024-02-25: qty 2
  Filled 2024-02-25: qty 1
  Filled 2024-02-25: qty 2
  Filled 2024-02-25 (×2): qty 1
  Filled 2024-02-25 (×2): qty 2
  Filled 2024-02-25 (×4): qty 1

## 2024-02-25 MED ORDER — PROPRANOLOL HCL 10 MG PO TABS
10.0000 mg | ORAL_TABLET | Freq: Three times a day (TID) | ORAL | Status: DC
  Administered 2024-02-25 – 2024-03-02 (×16): 10 mg via ORAL
  Filled 2024-02-25 (×21): qty 1

## 2024-02-25 MED ORDER — BENZTROPINE MESYLATE 1 MG PO TABS
1.0000 mg | ORAL_TABLET | Freq: Two times a day (BID) | ORAL | Status: DC
  Administered 2024-02-25 – 2024-03-02 (×12): 1 mg via ORAL
  Filled 2024-02-25 (×15): qty 1

## 2024-02-25 MED ORDER — ACETAMINOPHEN 325 MG PO TABS
650.0000 mg | ORAL_TABLET | ORAL | Status: DC | PRN
  Administered 2024-02-25: 650 mg via ORAL
  Filled 2024-02-25: qty 2

## 2024-02-25 MED ORDER — METFORMIN HCL 500 MG PO TABS
500.0000 mg | ORAL_TABLET | Freq: Two times a day (BID) | ORAL | Status: DC
  Administered 2024-02-25 – 2024-03-02 (×12): 500 mg via ORAL
  Filled 2024-02-25 (×12): qty 1

## 2024-02-25 MED ORDER — DULOXETINE HCL 20 MG PO CPEP
40.0000 mg | ORAL_CAPSULE | Freq: Two times a day (BID) | ORAL | Status: DC
Start: 2024-02-25 — End: 2024-03-02
  Administered 2024-02-25 – 2024-03-02 (×12): 40 mg via ORAL
  Filled 2024-02-25 (×12): qty 2

## 2024-02-25 MED ORDER — VALBENAZINE TOSYLATE 40 MG PO CAPS
80.0000 mg | ORAL_CAPSULE | Freq: Every day | ORAL | Status: DC
Start: 2024-02-26 — End: 2024-03-02
  Administered 2024-02-26 – 2024-03-02 (×6): 80 mg via ORAL
  Filled 2024-02-25 (×8): qty 2

## 2024-02-25 MED ORDER — ATORVASTATIN CALCIUM 10 MG PO TABS
20.0000 mg | ORAL_TABLET | Freq: Every day | ORAL | Status: DC
  Administered 2024-02-26 – 2024-03-02 (×6): 20 mg via ORAL
  Filled 2024-02-25 (×6): qty 2

## 2024-02-25 NOTE — ED Notes (Signed)
 Patient found standing trying to urinate during intentional rounding. Patient reminded to use call bell when trying to move around the room.

## 2024-02-25 NOTE — ED Notes (Signed)
 TTS in process

## 2024-02-25 NOTE — BH Assessment (Signed)
 Clinician messaged [name], RN: Hey. It's Trey with TTS. Is the pt able to engage in the assessment, if so the pt will need to be placed in a private room. Is the pt under IVC? Also is the pt medically cleared?   Clinician awaiting response.    Jackson JONETTA Broach, MS, [facility], [facility] Triage Specialist [phone]

## 2024-02-25 NOTE — BH Assessment (Signed)
 Clinician spoke to [place] with IRIS to complete pt's TTS assessment. Clinician provided pt's name, MRN, location, age, room number and provider's name. Secure message completed.    Iris coordinator to update secure chat when assessment time and provider are assigned.  Jackson JONETTA Broach, MS, [facility], [facility] Triage Specialist [phone]

## 2024-02-25 NOTE — ED Triage Notes (Signed)
 Pt BIB [facility] coming from called out by wife for talking crazy. Unknown LKW. Pt tremors at baseline. AO x4 on arrival. Patient c/o of back pain x2wks associated with dysuria.

## 2024-02-25 NOTE — ED Provider Notes (Signed)
 [place] [facility] Provider Note   CSN: [id number] Arrival date & time: 02/25/24  8057     Patient presents with: Altered Mental Status   [name] [name] is a 58 y.o. male with history of Parkinson's, behavioral disturbance, PTSD, tremor, tardive dyskinesia, chronic back pain, presenting by EMS with concern for erratic behavior.  Supplemental history provided by EMS initially, and then by [facility], who was initially listed as the patient's sister, but in fact is the patient's friend whom he lives with.  Sari tells me that the patient is living with her for the better part of 2 to 3 years because she has no family members, only 1 estranged brother does not want to talk to him.  She filled out for the patient and took him in.  She said he has had increasingly problems with the patient having frequent falls in the house, difficulty caring for himself.  She is concerned about erratic behavior including today where he had a very loud outburst in front of her, calling her names, and then was overheard stating he may kill himself.  She is concerned because he has access to 2 guns in the house.  It does appear the patient was in the ED for amatory dysfunction approximately 5 days ago.  I reviewed those records including the social work conversations on his last visit to the ED with weakness 5 days ago.  Unfortunately, the patient has not been deemed disabled in order for his Medicaid to convert to long-term care, and no reasonable or safe options for placement, therefore he was returned to his friend's home.  She says it is no longer an option does except him back because she feels scared for her life.     HPI     Prior to Admission medications   Medication Sig Start Date End Date Taking? Authorizing Provider  atorvastatin  (LIPITOR) 20 MG tablet TAKE 1 TABLET BY MOUTH DAILY 11/28/23   [name], [name]  benztropine  (COGENTIN ) 1 MG tablet Take 1 tablet (1  mg total) by mouth 2 (two) times daily. 01/26/24   Harl Zane BRAVO, NP  divalproex  (DEPAKOTE ) 500 MG DR tablet Take 1 tablet (500 mg total) by mouth 2 (two) times daily. 01/26/24   Harl Zane BRAVO, NP  DULoxetine  HCl 40 MG CPEP Take 40 mg by mouth 2 (two) times daily.    [provider]  glimepiride  (AMARYL ) 4 MG tablet TAKE 1 TABLET (4 MG TOTAL) BY MOUTH 2 (TWO) TIMES DAILY. 08/22/23   [name], [name]  hydrOXYzine  (ATARAX ) 50 MG tablet TAKE 1 TABLET BY MOUTH FOUR TIMES A DAY & TAKE 2 TABLETS EVERY NIGHT AT BEDTIME FOR ANXIETY, PANIC, AGITATION, AND SLEEP Patient taking differently: Take 100 mg by mouth in the morning and at bedtime. 01/26/24   Harl Zane BRAVO, NP  lisinopril  (ZESTRIL ) 20 MG tablet TAKE 1 TABLET BY MOUTH DAILY 02/15/24   [name], [name]  metFORMIN  (GLUCOPHAGE ) 500 MG tablet TAKE 1 TABLET (500 MG TOTAL) BY MOUTH 2 (TWO) TIMES DAILY. 08/22/23   [name], [name]  propranolol  (INDERAL ) 10 MG tablet Take 1 tablet (10 mg total) by mouth 3 (three) times daily. 01/26/24   Harl Zane BRAVO, NP  sildenafil  (REVATIO ) 20 MG tablet Take 1 tablet (20 mg total) by mouth daily as needed (as directed for E.D.). 02/09/23   Duanne Butler DASEN, MD  valbenazine  (INGREZZA ) 80 MG capsule Take 1 capsule (80 mg  total) by mouth daily. 01/26/24   Harl Zane BRAVO, NP    Allergies: Patient has no known allergies.    Review of Systems  Updated Vital Signs BP (!) 142/92 (BP Location: Left Arm)   Pulse 80   Temp 98.5 F (36.9 C) (Oral)   Resp 19   Ht 5' 10 (1.778 m)   Wt 93 kg   SpO2 96%   BMI 29.42 kg/m   Physical Exam Constitutional:      General: He is not in acute distress. HENT:     Head: Normocephalic and atraumatic.  Eyes:     Conjunctiva/sclera: Conjunctivae normal.     Pupils: Pupils are equal, round, and reactive to light.  Cardiovascular:     Rate and Rhythm: Normal rate and regular rhythm.  Pulmonary:     Effort: Pulmonary effort is normal. No  respiratory distress.  Abdominal:     General: There is no distension.     Tenderness: There is no abdominal tenderness.  Skin:    General: Skin is warm and dry.  Neurological:     General: No focal deficit present.     Mental Status: He is alert. Mental status is at baseline.     (all labs ordered are listed, but only abnormal results are displayed) Labs Reviewed  COMPREHENSIVE METABOLIC PANEL WITH GFR - Abnormal; Notable for the following components:      Result Value   Glucose, Bld 106 (*)    All other components within normal limits  CBG MONITORING, ED - Abnormal; Notable for the following components:   Glucose-Capillary 102 (*)    All other components within normal limits  RESP PANEL BY RT-PCR (RSV, FLU A&B, COVID)  RVPGX2  CBC  AMMONIA  ETHANOL  URINALYSIS, ROUTINE W REFLEX MICROSCOPIC  RAPID URINE DRUG SCREEN, HOSP PERFORMED    EKG: None  Radiology: CT Lumbar Spine Wo Contrast Result Date: 02/25/2024 EXAM: CT OF THE LUMBAR SPINE WITHOUT CONTRAST 02/25/2024 08:48:47 PM TECHNIQUE: CT of the lumbar spine was performed without the administration of intravenous contrast. Multiplanar reformatted images are provided for review. Automated exposure control, iterative reconstruction, and/or weight based adjustment of the mA/kV was utilized to reduce the radiation dose to as low as reasonably achievable. COMPARISON: CT lumbar spine 02/19/2024. CLINICAL HISTORY: Back trauma, no prior imaging (Age >= 16y). FINDINGS: BONES AND ALIGNMENT: Normal vertebral body heights. No acute fracture or suspicious bone lesion. Normal alignment. DEGENERATIVE CHANGES: Multilevel moderate degenerative change of the spine with posterior disc osteophyte complex formation at the T12-L1, L2-L3, L3-L4, L5-S1. No associated severe osseous neural foraminal or central canal stenosis. At least mild central canal stenosis at the L2-L3 level, moderate at the L3-L4 level. SOFT TISSUES: Left nephrolithiasis measuring  up to 1 cm with slight migration in location into the superiormost aspect of the renal pelvis. . No hydronephrosis. No hydroureter of the partially visualized ureters. Atherosclerotic plaque. IMPRESSION: 1. No acute traumatic injury of the lumbar spine. 2. Multilevel posterior disc osteophyte complex formation with up to moderate osseous central canal stenosis as above. 3. Nonobstructive 1 cm Left nephrolithiasis. Slight migration of the stone into the superiormost aspect of the renal pelvis. SABRA 4. Atherosclerotic vascular calcifications. Electronically signed by: Morgane Naveau MD 02/25/2024 09:00 PM EDT RP Workstation: [id number]   CT Head Wo Contrast Result Date: 02/25/2024 EXAM: CT HEAD WITHOUT CONTRAST 02/25/2024 08:48:47 PM TECHNIQUE: CT of the head was performed without the administration of intravenous contrast. Automated exposure control, iterative reconstruction, and/or  weight based adjustment of the mA/kV was utilized to reduce the radiation dose to as low as reasonably achievable. COMPARISON: CT head 02/19/2024. CLINICAL HISTORY: Mental status change, unknown cause. FINDINGS: BRAIN AND VENTRICLES: No acute hemorrhage. No evidence of acute infarct. No hydrocephalus. No extra-axial collection. No mass effect or midline shift. Trace atherosclerotic calcifications are present within the cavernous internal carotid arteries. Patchy and confluent areas of decreased attenuation are noted throughout the deep and periventricular white matter of the cerebral hemispheres bilaterally suggestive of chronic microvascular ischemic changes. ORBITS: No acute abnormality. SINUSES: No acute abnormality. SOFT TISSUES AND SKULL: No acute soft tissue abnormality. No skull fracture. IMPRESSION: 1. No acute intracranial abnormality. Electronically signed by: Morgane Naveau MD 02/25/2024 08:56 PM EDT RP Workstation: [id number]     Procedures   Medications Ordered in the ED - No data to display                                   Medical Decision Making Amount and/or Complexity of Data Reviewed Labs: ordered. Radiology: ordered.   Patient is here with generalized weakness, concern for frequent falls.  He complains he hurts all over but particularly in his low back.  He had some erratic behavior per the report of his friend whom he stays with, Wendi.  Patient denies to me he has a headache, chest pain, difficulty breathing.  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  He is slow to respond but does not appear overtly confused and encephalopathic.  Patient had a CT head and lumbar spine which I personally reviewed which is some chronic changes and findings, but no emergent findings.  His glucose and blood work is otherwise unremarkable.  He is still pending a UA and UDS, but I do not suspect this is likely reveal an underlying cause, as he is were negative on his prior ED visit last week  I did review his external records including his most recent ED visit 5 days ago.  At that time there were obvious social barriers for discharge, and those continued today, as the woman he stays with is refusing to accept him home.  They are unrelated and she reports the patient has not paid rent to her but has been living with her as a favor.  She is not frightened for her wellbeing, and reports concerns about his mental health.  The patient is medically cleared at this point. I suspect that there is some overlap of mental health disturbance and depression with his parkinson's.  He would benefit from a mental health evaluation.  We have no readily available disposition options, no family to take him in, and I do think he is a fall risk and has difficulty standing or ambulating.       Final diagnoses:  Psychiatric complaint  Fall, initial encounter    ED Discharge Orders     None          Cottie Donnice PARAS, MD 02/25/24  2223

## 2024-02-25 NOTE — ED Notes (Signed)
 Patient transported to CT

## 2024-02-26 DIAGNOSIS — F3130 Bipolar disorder, current episode depressed, mild or moderate severity, unspecified: Secondary | ICD-10-CM | POA: Diagnosis not present

## 2024-02-26 LAB — RAPID URINE DRUG SCREEN, HOSP PERFORMED
Amphetamines: NOT DETECTED
Barbiturates: NOT DETECTED
Benzodiazepines: NOT DETECTED
Cocaine: NOT DETECTED
Opiates: NOT DETECTED
Tetrahydrocannabinol: NOT DETECTED

## 2024-02-26 NOTE — ED Notes (Addendum)
 Patient assisted to restroom. Continues to require 1x assist to reach edge of bed and transfer to a WC for safety. Weakness and tremor noted which patient states is not baseline. After returning to bed, noted patient speaking to himself, appears to be conversing with internal stimuli.

## 2024-02-26 NOTE — ED Notes (Signed)
 Upon standing to return to the room pt had a near fall, assisted onto the toilet with assistance. Pt taken back to room in wheelchair.

## 2024-02-26 NOTE — Progress Notes (Signed)
 Patient has been denied by [facility] due to no appropriate beds available. Patient meets [facility] inpatient criteria per Bernadette Barefoot, NP. Patient has been faxed out to the following facilities:    [facility]  [address] [place] [place] [address] [phone]  [facility]  [address], [place] [address] [phone]  [facility]  [address] [place], [place] [address] [phone]  [facility] Patient Placement  [facility], [place] [phone]  [facility]  [address] [place] [address] [phone]  [facility]  [address] [place] [address] [phone]  [facility]  [address] [place] [address] [phone]  [facility] EFAX  [address] [place], [place] [phone]  [facility]  [address] [place] [address] [phone]  [facility]  [address] [place] [address] [phone]  [facility]  [address] [place], [place] [address] [phone]  [facility]  [address] [place] [address] [phone]  [facility]  [address] [place] [address] [phone]  [facility]  [address] [place] [address] [phone]  [facility]  [address] [place] [address] [phone]  [facility]  [address] [place] [address] [phone]  [facility]  [address] [place] [address] [phone]  [phone]   Bunnie Gallop, MSW, LCSW-A  12:38 PM 02/26/2024

## 2024-02-26 NOTE — ED Notes (Signed)
 Pt belongings placed in locker 1, valuables given to security.

## 2024-02-26 NOTE — Consult Note (Signed)
 Patient was recommended for psychiatric reassessment today.  However, based on his impaired cognition, and endorsement of suicidal thoughts and plan to shoot himself, access to lethal means at home, he is considered an acute safety risk and is referred for psychiatric admission.

## 2024-02-26 NOTE — BH Assessment (Signed)
 Comprehensive Clinical Assessment (CCA) Note  02/26/2024 [name] [name] [id number]  Disposition: [name] Olasunkanmi, NP recommends pt to be observed and reassessed by psychiatry due the pt's initial hesitation to respond to SI questions and his room mates concern overhearing the pt say he may kill himself. Disposition discussed with [name] [name], Charity fundraiser.   The patient demonstrates the following risk factors for suicide: Chronic risk factors for suicide include: psychiatric disorder of Bipolar I Disorder, most recent episode depressed (HCC). Acute risk factors for suicide include: Pt's initial hesitation to respond to SI questions and his room mates concern of SI. Protective factors for this patient include: None. Considering these factors, the overall suicide risk at this point appears to be no risk. Patient is not appropriate for outpatient follow up.  [name] [name] is a 58 year old male who presents voluntary and unaccompanied to [facility] Emergency Department. Clinician asked the pt, what brought you to the hospital? Pt reports, everything was good but his room mate brought him to the hospital because she said, he needed more help than she can keep. Initially, clinician asked the pt if he had suicidal thought however the pt just looked at clinician and did not respond. Clinician then asked the pt if he had any homicidal ideations. Pt reports, he's thought about it, the person is not around anymore in the past. Pt reports, he does argue with his room mate and he can try to do better.  Clinician reviewed pt's responses and asked the pt if he had any suicidal ideations or experienced any hallucinations, pt denied both. Pt also denies, HI, self-injurious behaviors and access to weapons.   Pt denies, substance use. Pt's UDS is negative. Per chart, the pt is linked to [name] Bach, NP for medication management at [facility]).  Pt presents, quiet,  awake with mild tremors in hospital gown with mumbles speech (at times) and normal eye contact. During the assessment, clinician asked the pt a questions about hallucinations. Clinician observed the pt staring into the camera, tremors and no responding to questions. Clinician messaged the pt's nurses to check with the pt. Clinician observed when the pt's nurses entered his room he immediately started talking and asking if it was dinner or breakfast time. Pt's affect was flat. Pt's insight, judgement was poor.   Chief Complaint:  Chief Complaint  Patient presents with   Altered Mental Status   Visit Diagnosis: Bipolar I Disorder, most recent episode depressed (HCC).  CCA Screening, Triage and Referral (STR)  Patient Reported Information How did you hear about us ? Family/Friend  What Is the Reason for Your Visit/Call Today? Pt reports, everything was good but he was brought to the hospital because his room mate feels he needs more help than she can keep. Pt denies, SI, HI, hallucinations, self-injurious behaviors and access to weapons.  How Long Has This Been Causing You Problems? <Week  What Do You Feel Would Help You the Most Today? Treatment for Depression or other mood problem   Have You Recently Had Any Thoughts About Hurting Yourself? -- (Initially pt did not respond but later denied SI questions.)  Are You Planning to Commit Suicide/Harm Yourself At This time? No   Flowsheet Row ED from 02/25/2024 in [facility] Department at [facility] Most recent reading at 02/25/2024  7:54 PM ED from 02/19/2024 in [facility] Emergency Department at [facility] Most recent reading at 02/19/2024  7:28 PM ED from  02/19/2024 in [facility] Emergency Department at [facility] Most recent reading at 02/19/2024  6:02 AM  C-SSRS RISK CATEGORY No Risk No Risk No Risk    Have you Recently Had Thoughts About Hurting Someone Sherral? No  Are You Planning to Harm  Someone at This Time? No  Explanation: NA   Have You Used Any Alcohol or Drugs in the Past 24 Hours? No  How Long Ago Did You Use Drugs or Alcohol? Pt denies.  What Did You Use and How Much? Pt denies.   Do You Currently Have a Therapist/Psychiatrist? -- (UTA)  Name of Therapist/Psychiatrist:    Have You Been Recently Discharged From Any Office Practice or Programs? No  Explanation of Discharge From Practice/Program: NA    CCA Screening Triage Referral Assessment Type of Contact: Tele-Assessment  Telemedicine Service Delivery: Telemedicine service delivery: This service was provided via telemedicine using a 2-way, interactive audio and video technology  Is this Initial or Reassessment? Is this Initial or Reassessment?: Initial Assessment  Date Telepsych consult ordered in CHL:  Date Telepsych consult ordered in CHL: 02/25/24  Time Telepsych consult ordered in CHL:  Time Telepsych consult ordered in CHL: 2337  Location of Assessment: [facility] Assessment Services  Provider Location: [facility] Assessment Services   Collateral Involvement: None   Does Patient Have a Automotive engineer Guardian? -- (NA)  Legal Guardian Contact Information: NA  Copy of Legal Guardianship Form: -- (NA)  Legal Guardian Notified of Arrival: -- (NA)  Legal Guardian Notified of Pending Discharge: -- (NA)  If Minor and Not Living with Parent(s), Who has Custody? NA  Is CPS involved or ever been involved? -- (UTA)  Is APS involved or ever been involved? -- (UTA)   Patient Determined To Be At Risk for Harm To Self or Others Based on Review of Patient Reported Information or Presenting Complaint? No (Pt denies, however per room mate she over heard the pt say he may kill himself.)  Method: No Plan  Availability of Means: No access or NA  Intent: Vague intent or NA  Notification Required: No need or identified person  Additional Information for Danger to Others Potential: --  (NA)  Additional Comments for Danger to Others Potential: NA  Are There Guns or Other Weapons in Your Home? Yes  Types of Guns/Weapons: Per room mate she has two guns in the home.  Are These Weapons Safely Secured?                            -- (UTA)  Who Could Verify You Are Able To Have These Secured: Pt's room mate.  Do You Have any Outstanding Charges, Pending Court Dates, Parole/Probation? Pt denies.  Contacted To Inform of Risk of Harm To Self or Others: Other: Comment (NA)    Does Patient Present under Involuntary Commitment? No    [place] of Residence: [place]   Patient Currently Receiving the Following Services: Not Receiving Services   Determination of Need: Urgent (48 hours)   Options For Referral: Inpatient Hospitalization; Medication Management; Outpatient Therapy     CCA Biopsychosocial Patient Reported Schizophrenia/Schizoaffective Diagnosis in Past: No   Strengths: Pt tried engaing in assessment.   Mental Health Symptoms Depression:  Irritability; Hopelessness; Worthlessness; Difficulty Concentrating; Fatigue (Islolation.)   Duration of Depressive symptoms: Duration of Depressive Symptoms: N/A   Mania:  None   Anxiety:   Difficulty concentrating; Fatigue; Irritability   Psychosis:  None   Duration of Psychotic symptoms:    Trauma:  None   Obsessions:  None   Compulsions:  None   Inattention:  Forgetful; Loses things   Hyperactivity/Impulsivity:  -- (Pt has tremors.)   Oppositional/Defiant Behaviors:  Angry   Emotional Irregularity:  Potentially harmful impulsivity   Other Mood/Personality Symptoms:  NA    Mental Status Exam Appearance and self-care  Stature:  Average   Weight:  Average weight   Clothing:  -- (Pt in hospital gown)   Grooming:  Normal   Cosmetic use:  None   Posture/gait:  Normal   Motor activity:  Tremor   Sensorium  Attention:  Normal   Concentration:  Normal   Orientation:  Person; Place    Recall/memory:  Defective in Immediate   Affect and Mood  Affect:  Flat   Mood:  Other (Comment) (UTA)   Relating  Eye contact:  Normal   Facial expression:  Responsive   Attitude toward examiner:  Guarded   Thought and Language  Speech flow: Other (Comment) (Mumbled at times.)   Thought content:  Appropriate to Mood and Circumstances   Preoccupation:  None   Hallucinations:  None   Organization:  Circumstantial   Company secretary of Knowledge:  Poor   Intelligence:  Average   Abstraction:  Functional   Judgement:  Poor   Reality Testing:  Adequate   Insight:  Lacking   Decision Making:  -- Industrial/product designer)   Social Functioning  Social Maturity:  Isolates   Social Judgement:  No data recorded  Stress  Stressors:  Other (Comment) (Pt reports, everything.)   Coping Ability:  Overwhelmed   Skill Deficits:  Communication; Self-control; Responsibility   Supports:  Friends/Service system     Religion: Religion/Spirituality Are You A Religious Person?: Yes What is Your Religious Affiliation?: Baptist How Might This Affect Treatment?: NA  Leisure/Recreation: Leisure / Recreation Do You Have Hobbies?:  (UTA)  Exercise/Diet: Exercise/Diet Do You Exercise?: No Have You Gained or Lost A Significant Amount of Weight in the Past Six Months?:  (UTA) Do You Follow a Special Diet?:  (UTA) Do You Have Any Trouble Sleeping?:  (UTA)   CCA Employment/Education Employment/Work Situation: Employment / Work Situation Employment Situation: Unemployed Patient's Job has Been Impacted by Current Illness: No Has Patient ever Been in Equities trader?:  Industrial/product designer)  Education: Education Is Patient Currently Attending School?:  (UTA) Last Grade Completed:  (UTA) Did You Attend College?:  (UTA) Did You Have An Individualized Education Program (IIEP):  (UTA) Did You Have Any Difficulty At School?:  (UTA) Patient's Education Has Been Impacted by Current Illness:   (UTA)   CCA Family/Childhood History Family and Relationship History: Family history Marital status: Single Does patient have children?: No  Childhood History:  Childhood History By whom was/is the patient raised?: Other (Comment) (UTA) Did patient suffer any verbal/emotional/physical/sexual abuse as a child?:  (UTA) Did patient suffer from severe childhood neglect?:  (UTA) Has patient ever been sexually abused/assaulted/raped as an adolescent or adult?:  (UTA) Was the patient ever a victim of a crime or a disaster?:  (UTA) Witnessed domestic violence?:  (UTA) Has patient been affected by domestic violence as an adult?:  (UTA)   CCA Substance Use Alcohol/Drug Use: Alcohol / Drug Use Pain Medications: See MAR Prescriptions: See MAR Over the Counter: See MAR History of alcohol / drug use?: No history of alcohol / drug abuse Longest period of sobriety (when/how long): NA Negative Consequences  of Use:  (NA) Withdrawal Symptoms: None    ASAM's:  Six Dimensions of Multidimensional Assessment  Dimension 1:  Acute Intoxication and/or Withdrawal Potential:      Dimension 2:  Biomedical Conditions and Complications:      Dimension 3:  Emotional, Behavioral, or Cognitive Conditions and Complications:     Dimension 4:  Readiness to Change:     Dimension 5:  Relapse, Continued use, or Continued Problem Potential:     Dimension 6:  Recovery/Living Environment:     ASAM Severity Score:    ASAM Recommended Level of Treatment:     Substance use Disorder (SUD)    Recommendations for Services/Supports/Treatments: Recommendations for Services/Supports/Treatments Recommendations For Services/Supports/Treatments: Other (Comment) (Pt to be observed and reassessed by psychiatric.)  Disposition Recommendation per psychiatric provider: Pt to be observed and reassessed by psychiatric.    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   Type 2 diabetes mellitus with hyperglycemia, without long-term current use of insulin  (HCC) 09/01/2022   Essential hypertension 09/01/2022   Mixed hyperlipidemia 09/01/2022   Tremor 09/01/2022   Bipolar I disorder, most recent episode depressed (HCC) 10/21/2021   Generalized anxiety disorder 01/23/2020   Moderate episode of recurrent major depressive disorder (HCC) 01/23/2020   Primary osteoarthritis of right knee 10/31/2019   MDD (major depressive disorder) 10/24/2019   PTSD (post-traumatic stress disorder) 10/24/2019     Referrals to Alternative Service(s): Referred to Alternative Service(s):   Place:   Date:   Time:    Referred to Alternative Service(s):   Place:   Date:   Time:    Referred to Alternative Service(s):   Place:   Date:   Time:    Referred to Alternative Service(s):   Place:   Date:   Time:     Jackson JONETTA Broach, [facility] Comprehensive Clinical Assessment (CCA) Screening, Triage and Referral Note  02/26/2024 [name] [name] [id number]  Chief Complaint:  Chief Complaint  Patient presents with   Altered Mental Status   Visit Diagnosis:   Patient Reported Information How did you hear about us ? Family/Friend  What Is the Reason for Your Visit/Call Today? Pt reports, everything was good but he was brought to the hospital because his room mate feels he needs more help than she can keep. Pt denies, SI, HI, hallucinations, self-injurious behaviors and access to weapons.  How Long Has This Been Causing You Problems? <Week  What Do You Feel Would Help You the Most Today? Treatment for Depression or other mood problem   Have You Recently Had Any Thoughts About Hurting Yourself? -- (Initially pt did not respond but later denied SI questions.)  Are You Planning to Commit Suicide/Harm Yourself At This time? No   Have you Recently Had Thoughts About Hurting Someone Sherral? No  Are You Planning to Harm Someone  at This Time? No  Explanation: NA   Have You Used Any Alcohol or Drugs in the Past 24 Hours? No  How Long Ago Did You Use Drugs or Alcohol? No data recorded What Did You Use and How Much? No data recorded  Do You Currently Have a Therapist/Psychiatrist? -- (UTA)  Name of Therapist/Psychiatrist: No data recorded  Have You Been Recently Discharged From Any Office Practice or Programs? No  Explanation of Discharge From Practice/Program: No data recorded   CCA Screening Triage Referral Assessment Type of Contact: Tele-Assessment  Telemedicine Service  Delivery: Telemedicine service delivery: This service was provided via telemedicine using a 2-way, interactive audio and video technology  Is this Initial or Reassessment? Is this Initial or Reassessment?: Initial Assessment  Date Telepsych consult ordered in CHL:  Date Telepsych consult ordered in CHL: 02/25/24  Time Telepsych consult ordered in [facility]L:  Time Telepsych consult ordered in [facility]: 2337  Location of Assessment: [facility] Assessment Services  Provider Location: [facility] Assessment Services    Collateral Involvement: None   Does Patient Have a Automotive engineer Guardian? No data recorded Name and Contact of Legal Guardian: No data recorded If Minor and Not Living with Parent(s), Who has Custody? NA  Is CPS involved or ever been involved? -- (UTA)  Is APS involved or ever been involved? -- (UTA)   Patient Determined To Be At Risk for Harm To Self or Others Based on Review of Patient Reported Information or Presenting Complaint? No (Pt denies, however per room mate she over heard the pt say he may kill himself.)  Method: No Plan  Availability of Means: No access or NA  Intent: Vague intent or NA  Notification Required: No need or identified person  Additional Information for Danger to Others Potential: -- (NA)  Additional Comments for Danger to Others Potential: NA  Are There Guns or Other Weapons in Your Home?  Yes  Types of Guns/Weapons: Per room mate she has two guns in the home.  Are These Weapons Safely Secured?                            -- (UTA)  Who Could Verify You Are Able To Have These Secured: Pt's room mate.  Do You Have any Outstanding Charges, Pending Court Dates, Parole/Probation? Pt denies.  Contacted To Inform of Risk of Harm To Self or Others: Other: Comment (NA)   Does Patient Present under Involuntary Commitment? No    [place] of Residence: [place]   Patient Currently Receiving the Following Services: Not Receiving Services   Determination of Need: Urgent (48 hours)   Options For Referral: Inpatient Hospitalization; Medication Management; Outpatient Therapy   Disposition Recommendation per psychiatric provider: Pt to be observed and reassessed by psychiatric.   Jackson JONETTA Broach, LCMHC     Dianne Bady D Bernestine Holsapple, MS, [facility], [facility] Triage Specialist [phone]

## 2024-02-26 NOTE — ED Notes (Signed)
 Assumed care on patient , wearing paper scrubs , denies SI or HI , no  auditory or visual hallucinations .

## 2024-02-26 NOTE — ED Notes (Signed)
 Patient ambulatory to RR with 1-person assist.

## 2024-02-26 NOTE — ED Notes (Signed)
 Pt wheeled to bathroom unstable on his feet

## 2024-02-27 ENCOUNTER — Encounter (HOSPITAL_COMMUNITY): Payer: Self-pay

## 2024-02-27 DIAGNOSIS — F3130 Bipolar disorder, current episode depressed, mild or moderate severity, unspecified: Secondary | ICD-10-CM | POA: Diagnosis not present

## 2024-02-27 NOTE — Progress Notes (Signed)
 LCSW Progress Note  [id number]   [name] [name]  02/27/2024  1:37 PM  Description:   Inpatient Psychiatric Referral  Patient was recommended inpatient per Bernadette Barefoot (NP). There are no available beds at [facility], per [facility] Carlo RN). Patient was referred to the following out of network facilities: Destination  Service Provider Address Phone Fax  [facility]  [address] [place] [place] [address] [phone]  [facility]  [address] [place] [address] [phone]  [facility]  [address] [place], [place] [address] [phone]  [facility] Patient Placement  [facility], [place] [phone]  [facility]  [address] [place] [address] [phone]  [facility]  [address], [place] [address] [phone]  [facility]  [address] [place] [address] [phone]  [facility] EFAX  [address] [place], [place] [phone]  [facility]  [address] [place] [address] [phone]  [facility]  [address] [place] [address] [phone]  [facility]  [address] [place], [place] [address] [phone]  [facility]  [address] [place] [address] [phone]  [facility]  [address] [place] [address] [phone]  [facility]  [address] [place] [address] [phone]  [facility]  [address], [place] [address] [phone]   [facility]  [address], [place] [address] [phone]  [facility]  [address] [place] [address] [phone]      Situation ongoing, CSW to continue following and update chart as more information becomes available.     [place] Othello Dickenson, MSW, LCSW  02/27/2024 1:37 PM

## 2024-02-27 NOTE — Progress Notes (Signed)
 Inpatient Psychiatric Referral  Patient was recommended inpatient per Bernadette Barefoot, NP . There are [facility] beds at [facility], per [facility] . Patient was referred to the following out of network facilities:  Destination  Service Provider Address Phone Fax  [facility]  [address] [place] [place] [address] [phone]  [facility]  [address] [place] [address] [phone]  [facility]  [address] [place], [place] [address] [phone]  [facility] Patient Placement  [facility], [place] [phone]  [facility]  [address] [place] [address] [phone]  [facility]  [address] [place] [address] [phone]  [facility]  [address] [place] [address] [phone]  [facility] EFAX  [address] [place], [place] [phone]  [facility]  [address] [place] [address] [phone]  [facility]  [address] [place] [address] [phone]  [facility]  [address] [place], [place] [address] [phone]  [facility]  [address] [place] [address] [phone]  [facility]  [address], [place] [address] [phone]  [facility]  [address] [place] [address] [phone]  [facility]  [address] [place] [address] [phone]  [facility]  [address] [place] [address] [phone]  [facility]  [address], [place] [address] [phone]    Situation ongoing, CSW to continue following and update chart as more information becomes available.   Harrie Sofia MSW, LCSWA 02/27/2024  9:26PM

## 2024-02-27 NOTE — ED Provider Notes (Signed)
 Emergency Medicine Observation Re-evaluation Note  ROHIN KREJCI is a 58 y.o. male, seen on rounds today.  Pt initially presented to the ED for complaints of Altered Mental Status Currently, the patient is no new complaints.  Physical Exam  BP (!) 154/97   Pulse 97   Temp 98.1 F (36.7 C) (Oral)   Resp 20   Ht 5' 10 (1.778 m)   Wt 93 kg   SpO2 98%   BMI 29.42 kg/m  Physical Exam General: Resting comfortably in stretcher Lungs: Normal work of breathing Psych: Calm  ED Course / MDM  EKG:   I have reviewed the labs performed to date as well as medications administered while in observation.  Recent changes in the last 24 hours include TTS recommending inpatient treatment.  Plan  Current plan is for inpatient psych placement.    Yolande Lamar BROCKS, MD 02/27/24 [id number]

## 2024-02-27 NOTE — ED Notes (Signed)
 Pt continually trying to get out the bed. When asked where he wants to go, he states he does not know. Notified pt the time of day and assisted him x2 assist with standing at the side of the bed to use the urinal. Weakness and tremor noted. Once returned to bed, pt appears to be speaking to himself and possibly conversing with internal stimuli.

## 2024-02-28 DIAGNOSIS — F3130 Bipolar disorder, current episode depressed, mild or moderate severity, unspecified: Secondary | ICD-10-CM | POA: Diagnosis not present

## 2024-02-28 NOTE — Progress Notes (Signed)
 Inpatient Psychiatric Referral  Patient was recommended inpatient per Bernadette Barefoot, NP. There are no available beds at [facility], per [facility]. Patient was referred to the following out of network facilities:  Destination  Service Provider Address Phone Fax  [facility]  [address] [place] [place] [address] [phone]  [facility]  [address], [place] [address] [phone]  [facility]  [address] [place], [place] [address] [phone]  [facility] Patient Placement  [facility], [place] [phone]  [facility]  [address] [place] [address] [phone]  [facility]  [address], [place] [address] [phone]  [facility]  [address] [place] [address] [phone]  [facility] EFAX  [address] [place], [place] [phone]  [facility]  [address] [place] [address] [phone]  [facility]  [address] [place] [address] [phone]  [facility]  [address] [place], [place] [address] [phone]  [facility]  [address] [place] [address] [phone]  [facility]  [address], [place] [address] [phone]  [facility]  [address] [place] [address] [phone]  [facility]  [address], [place] [address] [phone]  [facility]  [address] [place] [address] [phone]  [facility]  [address], [place] [address] [phone]    Situation ongoing, CSW to continue following and update chart as more information becomes available.   Harrie Sofia MSW, [place] 02/28/2024

## 2024-02-28 NOTE — ED Notes (Addendum)
 Sitter shift has ended, staffing unable to send replacement at this time. Charge RN made aware, unable to find staff to sit 1:1 with patient at this time. Pt is close to nurses station, bed alarm is on, frequent rounding completed.

## 2024-02-28 NOTE — ED Provider Notes (Signed)
 Emergency Medicine Observation Re-evaluation Note  [name] [name] is a 58 y.o. male, seen on rounds today.  Pt initially presented to the ED for complaints of Altered Mental Status Currently, the patient is resting.  Physical Exam  BP (!) 159/87   Pulse 77   Temp 98.4 F (36.9 C) (Oral)   Resp 16   Ht 5' 10 (1.778 m)   Wt 93 kg   SpO2 100%   BMI 29.42 kg/m  Physical Exam General: NAD Lungs: Normal work of breathing Psych: Calm, no agitation  ED Course / MDM  EKG:   I have reviewed the labs performed to date as well as medications administered while in observation.  Recent changes in the last 24 hours include TTS recommending inpatient treatment.  Plan  Current plan is for inpatient psych placement.     Jerrol Agent, MD 02/28/24 [phone]

## 2024-02-28 NOTE — ED Notes (Addendum)
 Pt sleeping in room, remains within view from nurses station. Security remains present at all times. Bed alarm on.

## 2024-02-28 NOTE — ED Notes (Signed)
 Pt assisted with urinal, pt calm and cooperative, confused but oriented to self. Pt repositioned in bed, watching tv, w/o complaints at this time. IV flushed, remains patent. Bed alarm on.

## 2024-02-29 ENCOUNTER — Encounter (HOSPITAL_COMMUNITY): Payer: Self-pay | Admitting: Psychiatry

## 2024-02-29 DIAGNOSIS — T50995A Adverse effect of other drugs, medicaments and biological substances, initial encounter: Secondary | ICD-10-CM

## 2024-02-29 DIAGNOSIS — F319 Bipolar disorder, unspecified: Secondary | ICD-10-CM

## 2024-02-29 DIAGNOSIS — F3130 Bipolar disorder, current episode depressed, mild or moderate severity, unspecified: Secondary | ICD-10-CM | POA: Diagnosis not present

## 2024-02-29 DIAGNOSIS — G2401 Drug induced subacute dyskinesia: Secondary | ICD-10-CM

## 2024-02-29 NOTE — Progress Notes (Addendum)
 Notified by psych provider that pt is psych cleared. Provider recommending placement at ALF and that pt can't take care of himself at home. CSW met with pt bedside. Pt confirms he has been staying with his friend [name] at address listed in chart. He is open to placement whether at SNF or ALF. CSW explained there may be barriers to placement. Inquired if pt could return home with [place] and pt stated he maybe could. Barriers to placement include undesirable insurance, no LTC payor, and psych history. Pt states he does not have any income. Potentially could arrange [facility] services and include a HH social worker to work on long term placement outpatient. Will await PT eval.   CSW made referral to financial navigator for pt to be screened for LTC medicaid and disability.   1605: PT recommending SNF. PT spoke with pt's friend [name] and reported that [place] will not allow pt back home now and potentially ever and pt is estranged from brother with no other care available to him. Fl2 completed and bed requests sent in hub.   PASRR is pending. Documents have been uploaded to ncmust

## 2024-02-29 NOTE — NC FL2 (Signed)
 [place]  MEDICAID FL2 LEVEL OF CARE FORM     IDENTIFICATION  Patient Name: [name] [name] Birthdate: 09-11-1965 Sex: male Admission Date (Current Location): 02/25/2024  [facility] and [place] Number:  Producer, television/film/video and Address:  [facility], [address], [place], [place] [address]      Provider Number: [id number]  Attending Physician Name and Address:  Garrick Charleston, MD  Relative Name and Phone Number:       Current Level of Care: Hospital Recommended Level of Care: Skilled Nursing Facility Prior Approval Number:    Date Approved/Denied:   PASRR Number: Pending  Discharge Plan: SNF    Current Diagnoses: Patient Active Problem List   Diagnosis Date Noted   Tardive dyskinesia 02/29/2024   Physical exam, annual 12/08/2023   Herpes simplex antibody positive 01/13/2023   Type 2 diabetes mellitus with hyperglycemia, without long-term current use of insulin  (HCC) 09/01/2022   Essential hypertension 09/01/2022   Mixed hyperlipidemia 09/01/2022   Tremor 09/01/2022   Bipolar I disorder, most recent episode depressed (HCC) 10/21/2021   Generalized anxiety disorder 01/23/2020   Moderate episode of recurrent major depressive disorder (HCC) 01/23/2020   Primary osteoarthritis of right knee 10/31/2019   MDD (major depressive disorder) 10/24/2019   PTSD (post-traumatic stress disorder) 10/24/2019    Orientation RESPIRATION BLADDER Height & Weight     Self, Time, Situation, Place  Normal Continent Weight: 205 lb 0.4 oz (93 kg) Height:  5' 10 (177.8 cm)  BEHAVIORAL SYMPTOMS/MOOD NEUROLOGICAL BOWEL NUTRITION STATUS      Continent Diet (see d/c summary)  AMBULATORY STATUS COMMUNICATION OF NEEDS Skin   Extensive Assist Verbally Normal                       Personal Care Assistance Level of Assistance  Bathing, Feeding, Dressing Bathing Assistance: Maximum assistance Feeding assistance: Independent Dressing Assistance: Limited  assistance     Functional Limitations Info  Sight, Hearing, Speech Sight Info: Adequate Hearing Info: Adequate Speech Info: Adequate    SPECIAL CARE FACTORS FREQUENCY  OT (By licensed OT), PT (By licensed PT)     PT Frequency: 5x/week OT Frequency: 5x/week            Contractures Contractures Info: Not present    Additional Factors Info  Code Status, Allergies Code Status Info: full code Allergies Info: no known allergies           Current Medications (02/29/2024):  This is the current hospital active medication list Current Facility-Administered Medications  Medication Dose Route Frequency Provider Last Rate Last Admin   acetaminophen  (TYLENOL ) tablet 650 mg  650 mg Oral Q4H PRN Cottie Donnice PARAS, MD   650 mg at 02/25/24 2358   atorvastatin  (LIPITOR) tablet 20 mg  20 mg Oral Daily Trifan, Matthew J, MD   20 mg at 02/29/24 9073   benztropine  (COGENTIN ) tablet 1 mg  1 mg Oral BID Trifan, Matthew J, MD   1 mg at 02/29/24 9073   divalproex  (DEPAKOTE ) DR tablet 500 mg  500 mg Oral BID Trifan, Matthew J, MD   500 mg at 02/29/24 9073   DULoxetine  (CYMBALTA ) DR capsule 40 mg  40 mg Oral BID Trifan, Matthew J, MD   40 mg at 02/29/24 9072   glimepiride  (AMARYL ) tablet 4 mg  4 mg Oral BID WC Cottie Donnice PARAS, MD   4 mg at 02/29/24 9074   hydrOXYzine  (ATARAX ) tablet 25 mg  25  mg Oral TID PRN Trifan, Matthew J, MD   25 mg at 02/27/24 8165   lisinopril  (ZESTRIL ) tablet 20 mg  20 mg Oral Daily Trifan, Matthew J, MD   20 mg at 02/29/24 9073   metFORMIN  (GLUCOPHAGE ) tablet 500 mg  500 mg Oral BID Cottie Donnice PARAS, MD   500 mg at 02/29/24 9072   propranolol  (INDERAL ) tablet 10 mg  10 mg Oral TID Trifan, Matthew J, MD   10 mg at 02/29/24 9072   valbenazine  (INGREZZA ) capsule 80 mg  80 mg Oral Daily Trifan, Matthew J, MD   80 mg at 02/29/24 9072   Current Outpatient Medications  Medication Sig Dispense Refill   atorvastatin  (LIPITOR) 20 MG tablet TAKE 1 TABLET BY MOUTH DAILY (Patient  taking differently: Take 20 mg by mouth at bedtime.) 30 tablet 1   benztropine  (COGENTIN ) 1 MG tablet Take 1 tablet (1 mg total) by mouth 2 (two) times daily. 60 tablet 3   divalproex  (DEPAKOTE ) 500 MG DR tablet Take 1 tablet (500 mg total) by mouth 2 (two) times daily. 60 tablet 3   DULoxetine  HCl 40 MG CPEP Take 40 mg by mouth 2 (two) times daily.     glimepiride  (AMARYL ) 4 MG tablet TAKE 1 TABLET (4 MG TOTAL) BY MOUTH 2 (TWO) TIMES DAILY. 180 tablet 1   hydrOXYzine  (ATARAX ) 50 MG tablet TAKE 1 TABLET BY MOUTH FOUR TIMES A DAY & TAKE 2 TABLETS EVERY NIGHT AT BEDTIME FOR ANXIETY, PANIC, AGITATION, AND SLEEP (Patient taking differently: Take 100 mg by mouth at bedtime.) 120 tablet 3   lisinopril  (ZESTRIL ) 20 MG tablet TAKE 1 TABLET BY MOUTH DAILY 90 tablet 1   metFORMIN  (GLUCOPHAGE ) 500 MG tablet TAKE 1 TABLET (500 MG TOTAL) BY MOUTH 2 (TWO) TIMES DAILY. 60 tablet 3   propranolol  (INDERAL ) 10 MG tablet Take 1 tablet (10 mg total) by mouth 3 (three) times daily. 90 tablet 1   valbenazine  (INGREZZA ) 80 MG capsule Take 1 capsule (80 mg total) by mouth daily. 30 capsule 3   sildenafil  (REVATIO ) 20 MG tablet Take 1 tablet (20 mg total) by mouth daily as needed (as directed for E.D.). (Patient not taking: Reported on 02/26/2024) 10 tablet 2     Discharge Medications: Please see discharge summary for a list of discharge medications.  Relevant Imaging Results:  Relevant Lab Results:   Additional Information SSN [id number] [place], [place]

## 2024-02-29 NOTE — Evaluation (Addendum)
 Physical Therapy Evaluation Patient Details Name: [name] [name] MRN: [id number] DOB: [birth year] Today's Date: 02/29/2024  History of Present Illness  Pt is a 58 y.o. male who presented 02/25/24 with concern for erratic behavior and potential SI. PMHx: Parkinson's, behavioral disturbance, Bipolar depression, PTSD, anxiety, IIDM, HTN, HLD, PD, tremor, tardive dyskinesia, chronic back pain, L-sided Bell's palsy   Clinical Impression  Pt presents with condition above and deficits mentioned below, see PT Problem List. PTA, he was mod I holding onto furniture for functional mobility, living with his friend, [name] [name]. He endorses x8 falls in the past week and reports he knows he should be using his RW but does not use it. Had long discussion with [name] [name] via phone on options for pt obtaining assistance at d/c and [name] [name] expressed she did not feel comfortable with the pt discharging to live with her now and potentially ever, reporting fear for her safety. She reports the pt is estranged from her brother and does not have anyone else that can assist him at d/c. Currently, the pt displays deficits in balance, power, strength, coordination, and cognition. He was disoriented to situation (or did not want to admit the cause of his admission). He has tremors and myoclonic jerking intermittently. When sitting up EOB, pt has spontaneous posterior LOB bouts, needing modA to recover. He also has a posterior bias when transferring to stand, needing min-modA for balance during transfers. He has a tendency to flex laterally to his L but veer his RW to his R when ambulating, needing minA for balance and safety. He is at high risk for falls and has had many recent falls. Considering this and a lack of a safe d/c plan to stay with friends or family, pt could greatly benefit from inpatient rehab, < 3 hours/day. Pt may need to consider long-term care at an ALF after that. Will continue to follow acutely.      If plan is  discharge home, recommend the following: A little help with bathing/dressing/bathroom;Assistance with cooking/housework;Assist for transportation;Help with stairs or ramp for entrance;A lot of help with walking and/or transfers;Direct supervision/assist for medications management;Direct supervision/assist for financial management;Supervision due to cognitive status   Can travel by private vehicle   Yes    Equipment Recommendations BSC/3in1;Wheelchair (measurements PT);Wheelchair cushion (measurements PT)  Recommendations for Other Services  OT consult    Functional Status Assessment Patient has had a recent decline in their functional status and demonstrates the ability to make significant improvements in function in a reasonable and predictable amount of time.     Precautions / Restrictions Precautions Precautions: Fall;Other (comment) Precaution/Restrictions Comments: myoclonic jerking Restrictions Weight Bearing Restrictions Per Provider Order: No      Mobility  Bed Mobility Overal bed mobility: Needs Assistance Bed Mobility: Supine to Sit, Sit to Supine     Supine to sit: Mod assist, HOB elevated Sit to supine: Contact guard assist   General bed mobility comments: Pt used momentum to rock his trunk to sit up R EOB, but pt with posterior LOB upon sitting up, needing modA to recover. CGA for safety with return to supine    Transfers Overall transfer level: Needs assistance Equipment used: Rolling walker (2 wheels) Transfers: Sit to/from Stand Sit to Stand: Min assist, Mod assist           General transfer comment: Pt had a posterior lean upon standing, preferring to pull up on the RW. Cued pt to push up from bed instead and improve his anterior weight  shift through bringing his nose over toes. With these cues, pt was able to reduce his posterior lean, progressing from requiring modA to minA for stability upon standing. Performed sit <> stand 4x from EOB.     Ambulation/Gait Ambulation/Gait assistance: Min assist Gait Distance (Feet): 40 Feet Assistive device: Rolling walker (2 wheels) Gait Pattern/deviations: Step-through pattern, Trunk flexed, Decreased stride length, Decreased step length - right, Decreased step length - left, Shuffle, Drifts right/left Gait velocity: reduced Gait velocity interpretation: <1.31 ft/sec, indicative of household ambulator   General Gait Details: Pt ambulates slowly with a flexed posture and shuffling gait pattern. He tends to laterally flex at his trunk to his L but veer to his R, needing minA for balance. VCs provided to increase step length and feet clearance.  Stairs            Wheelchair Mobility     Tilt Bed    Modified Rankin (Stroke Patients Only) Modified Rankin (Stroke Patients Only) Pre-Morbid Rankin Score: Slight disability Modified Rankin: Moderate disability     Balance Overall balance assessment: Needs assistance Sitting-balance support: Bilateral upper extremity supported, Feet supported Sitting balance-Leahy Scale: Poor Sitting balance - Comments: Pt with intermittent spontaneous LOB posteriorly, needing modA to recover, when pt was able to sustain his balance he only needed CGA for safety   Standing balance support: Bilateral upper extremity supported, During functional activity, Reliant on assistive device for balance Standing balance-Leahy Scale: Poor Standing balance comment: MinA and RW to balance                             Pertinent Vitals/Pain Pain Assessment Pain Assessment: Faces Faces Pain Scale: Hurts a little bit Pain Location: generalized with mobility Pain Descriptors / Indicators: Discomfort, Guarding, Grimacing Pain Intervention(s): Limited activity within patient's tolerance, Monitored during session, Repositioned    Home Living Family/patient expects to be discharged to:: Private residence Living Arrangements: Non-relatives/Friends  [name] [name]) Available Help at Discharge: Available 24 hours/day;Other (Comment) (but is not able to physically assist, [name] [name] has an aide to assist her) Type of Home: House Home Access: Stairs to enter Entrance Stairs-Rails: Right Entrance Stairs-Number of Steps: 2   Home Layout: One level Home Equipment: Agricultural consultant (2 wheels) Additional Comments: info above contradicts info obtained from prior admissions, unsure of accuracy    Prior Function Prior Level of Function : Independent/Modified Independent;History of Falls (last six months)             Mobility Comments: Mod I holding onto furniture, does not like to use his RW even though he knows he needs to use it, reports x8 falls in past week       Extremity/Trunk Assessment   Upper Extremity Assessment Upper Extremity Assessment: Defer to OT evaluation    Lower Extremity Assessment Lower Extremity Assessment: RLE deficits/detail;LLE deficits/detail;Generalized weakness RLE Deficits / Details: denied numbness/tingling currently; MMT scores of 4- hip flexion, 4- knee extension, 4+ ankle dorsiflexion LLE Deficits / Details: denied numbness/tingling currently; MMT scores of 4- hip flexion, 4- knee extension, 4+ ankle dorsiflexion       Communication   Communication Communication: No apparent difficulties    Cognition Arousal: Alert Behavior During Therapy: Flat affect   PT - Cognitive impairments: No family/caregiver present to determine baseline, Orientation, Awareness, Problem solving   Orientation impairments: Situation                   PT - Cognition  Comments: Pt reports he was admitted to hospital due to passing out at work, seemed he was reluctant to admit he was having SI that lead to his admission, but did admit a hx of SI. Pt needing cues to problem solve how to correct his balance and safety with sitting and transfers. Following commands: Impaired Following commands impaired: Follows multi-step  commands with increased time, Follows multi-step commands inconsistently     Cueing Cueing Techniques: Verbal cues, Tactile cues, Gestural cues, Visual cues     General Comments      Exercises     Assessment/Plan    PT Assessment Patient needs continued PT services  PT Problem List Decreased balance;Decreased mobility;Decreased knowledge of use of DME;Decreased strength;Decreased activity tolerance;Decreased safety awareness;Decreased cognition       PT Treatment Interventions DME instruction;Gait training;Stair training;Functional mobility training;Therapeutic activities;Therapeutic exercise;Balance training;Neuromuscular re-education;Cognitive remediation;Patient/family education    PT Goals (Current goals can be found in the Care Plan section)  Acute Rehab PT Goals Patient Stated Goal: to get rehab PT Goal Formulation: With patient Time For Goal Achievement: 03/14/24 Potential to Achieve Goals: Good    Frequency Min 2X/week     Co-evaluation               AM-PAC PT 6 Clicks Mobility  Outcome Measure Help needed turning from your back to your side while in a flat bed without using bedrails?: A Little Help needed moving from lying on your back to sitting on the side of a flat bed without using bedrails?: A Lot Help needed moving to and from a bed to a chair (including a wheelchair)?: A Little Help needed standing up from a chair using your arms (e.g., wheelchair or bedside chair)?: A Lot Help needed to walk in hospital room?: A Little Help needed climbing 3-5 steps with a railing? : Total 6 Click Score: 14    End of Session Equipment Utilized During Treatment: Gait belt Activity Tolerance: Patient tolerated treatment well Patient left: in bed;with call bell/phone within reach;with bed alarm set Nurse Communication: Mobility status PT Visit Diagnosis: Difficulty in walking, not elsewhere classified (R26.2);History of falling (Z91.81);Unsteadiness on feet  (R26.81);Other abnormalities of gait and mobility (R26.89);Muscle weakness (generalized) (M62.81);Repeated falls (R29.6)    Time: 8542-8465 PT Time Calculation (min) (ACUTE ONLY): 37 min   Charges:   PT Evaluation $PT Eval Moderate Complexity: 1 Mod PT Treatments $Therapeutic Activity: 8-22 mins PT General Charges $$ ACUTE PT VISIT: 1 Visit         Theo Ferretti, PT, DPT Acute Rehabilitation Services  Office: [phone]   Theo CHRISTELLA Ferretti 02/29/2024, 3:57 PM

## 2024-02-29 NOTE — Progress Notes (Signed)
   02/29/24 1639  TOC ED Mini Assessment  TOC Time spent with patient (minutes): 30   Pt dc'd c/[facility] PT, OT at previous encounter, however he refused. Spoke c/pt at bedside today and he is agreeable to [facility] c/[facility]. Artavia at [facility] accepted referral for an aide and RNCM to assist pt c/DSS disability process if pt is discharged home.

## 2024-02-29 NOTE — Progress Notes (Signed)
       RE:   [name] [name]  Date of Birth:  23-Jun-1965  Date:   02/29/2024    To Whom It May Concern:  Please be advised that the above-named patient will require a short-term nursing home stay - anticipated 30 days or less for rehabilitation and strengthening.  The plan is for return home.

## 2024-02-29 NOTE — ED Provider Notes (Signed)
 Emergency Medicine Observation Re-evaluation Note  [name] [name] is a 58 y.o. male, seen on rounds today.  Pt initially presented to the ED for complaints of Altered Mental Status Currently, the patient is resting comfortably.  Physical Exam  BP (!) 148/79 (BP Location: Left Arm)   Pulse 67   Temp 98.2 F (36.8 C) (Oral)   Resp 17   Ht 1.778 m (5' 10)   Wt 93 kg   SpO2 93%   BMI 29.42 kg/m  Physical Exam General: Adult male resting Cardiac: Regular rate and rhythm Lungs: No increased work of breathing Psych: Calm  ED Course / MDM  EKG:   I have reviewed the labs performed to date as well as medications administered while in observation.  Recent changes in the last 24 hours include no notable changes.  Plan  Current plan is for placement.    [name] Charleston, MD 02/29/24 [phone]

## 2024-02-29 NOTE — Progress Notes (Signed)
 LCSW Progress Note  [id number]   [name] [name]  02/29/2024  12:21 PM  Description:   Inpatient Psychiatric Referral  Patient was recommended inpatient per Bernadette Barefoot  (NP). There are no available beds at [facility], per [facility] Cherylynn Ernst RN). Patient was referred to the following out of network facilities:   Destination  Service Provider Address Phone Fax  [facility]  [address] [place] [place] [address] [phone]  [facility]  [address], [place] [address] [phone]  [facility]  [address] [place], [place] [address] [phone]  [facility] Patient Placement  [facility], [place] [phone]  [facility]  [address] [place] [address] [phone]  [facility]  [address], [place] [address] [phone]  [facility]  [address] [place] [address] [phone]  [facility] EFAX  [address] [place], [place] [phone]  [facility]  [address] [place] [address] [phone]  [facility]  [address] [place] [address] [phone]  [facility]  [address] [place], [place] [address] [phone]  [facility]  [address] [place] [address] [phone]  [facility]  [address] [place] [address] [phone]  [facility]  [address] [place] [address] [phone]  [facility]  [address] [place] [address] [phone]   [facility]  [address], [place] [address] [phone]  [facility]  [address], [place] [address] [phone]      Situation ongoing, CSW to continue following and update chart as more information becomes available.     [place] Delesa Kawa, MSW, LCSW  02/29/2024 12:21 PM

## 2024-02-29 NOTE — Consult Note (Addendum)
 [facility] Psychiatric Consult Initial  Patient Name: .[name] [name]  MRN: [id number]  DOB: 01/16/1966  Consult Order details:  Orders (From admission, onward)     Start     Ordered   02/25/24 2225  CONSULT TO CALL ACT TEAM       Comments: See ed note. Unclear what the exact status of the patient's home/living situation with friend [name], but she refuses to accept him back into the house.  Ordering Provider: Cottie Donnice PARAS, MD  Provider:  (Not yet assigned)  Question:  Reason for Consult?  Answer:  assistance with placement   02/25/24 2224   02/25/24 2224  CONSULT TO CALL ACT TEAM       Ordering Provider: Cottie Donnice PARAS, MD  Provider:  (Not yet assigned)  Question:  Reason for Consult?  Answer:  Psych consult   02/25/24 2224             Mode of Visit: In person    Psychiatry Consult Evaluation  Service Date: February 29, 2024 LOS:  LOS: 0 days  Chief Complaint: Psychiatric concerns  Primary Psychiatric Diagnoses    Bipolar I disorder, most recent episode depressed (HCC) 2.    Tardive dyskinesia   Assessment   [name] [name] is a 59 y.o. Caucasian male with a past psychiatric history of tardive dyskinesia, bipolar 1 disorder, PTSD, MDD, and GAD, with pertinent medical comorbidities/history that include obesity, hyperlipidemia, essential hypertension, frequent falls, chronic lower back pain, diabetes type 2, and osteoarthritis of the right knee, who presented this encounter by way of EMS for concerns for destabilization of the patient's mental health, who upon EDP evaluation, consulted psychiatry for specialty evaluation and recommendations.  Patient currently is medically clear, as well as voluntary, per EDP team.  Upon follow-up investigation conducted, patient presents with symptomology that is most consistent with stable bipolar 1 disorder, most recent episode depressed, and tardive dyskinesia.  Evidence of this is appreciable from follow-up investigation  conducted, where patient endorses that in the context of being in the emergency department, where he is provided with his medications, and has current frequent help with addressing his care needs, he has since appreciated a very good and substantial improvement in his overall mood and wellbeing, and upon physical exam it is appreciable that the patient continues to experience tremors to his bilateral hands, of which is consistent with tardive dyskinesia, of which he is on medications for.  Given follow-up investigation conducted, where there is no evidence to suggest that the patient is an imminent risk to himself or others, or decompensated in such manner that would be a concern for for safety, or decompensation of the patient's mental health, recommendation at this time is for psychiatric clearance, as well as additional recommendations listed below.  Spoke to Dr. Merilee who agrees with recommendation for psychiatric clearance, as well as additional recommendations listed below.  Diagnoses:  Active Hospital problems: Principal Problem:   Bipolar I disorder, most recent episode depressed (HCC) Active Problems:   Tardive dyskinesia    Plan   #Bipolar I disorder, most recent episode depressed (HCC) (chronic, stable) #Tardive dyskinesia  ## Psychiatric Recommendations:   - Recommend continue current psychiatric medication regimen - Recommend TOC consult for safe discharge planning and coordination to assisted living - Recommend close outpatient follow-up with psychiatric medication management/therapy services at the [facility] - Recommend safety planning upon discharge  ## Medical Decision Making Capacity: Has capacity  ## Further Work-up: None at this time  ##  Disposition:-- There are no psychiatric contraindications to discharge at this time; recommend assisted living placement  ## Behavioral / Environmental: -Recommend delirium precautions, safety precautions, and agitation precautions-  routine; recommend strict adherence to a safety plan upon discharge    ## Safety and Observation Level:  - Based on my clinical evaluation, I estimate the patient to be at low risk of self harm in the current setting and upon recommendation for discharge. - At this time, we recommend  routine. This decision is based on my review of the chart including patient's history and current presentation, interview of the patient, mental status examination, and consideration of suicide risk including evaluating suicidal ideation, plan, intent, suicidal or self-harm behaviors, risk factors, and protective factors. This judgment is based on our ability to directly address suicide risk, implement suicide prevention strategies, and develop a safety plan while the patient is in the clinical setting. Please contact our team if there is a concern that risk level has changed.  CSSR Risk Category:C-SSRS RISK CATEGORY: No Risk  Suicide Risk Assessment: Patient has following modifiable risk factors for suicide: triggering events and pain, medical illness (ie new dx of cancer), which we are addressing by recommendations. Patient has following non-modifiable or demographic risk factors for suicide: male gender Patient has the following protective factors against suicide: Access to outpatient mental health care, Supportive friends, Frustration tolerance, no history of suicide attempts, and no history of NSSIB  Thank you for this consult request. Recommendations have been communicated to the primary team.  We will sign off at this time.   [name] JINNY Gravely, NP       History of Present Illness   [name] [name] is a 58 y.o. Caucasian male with a past psychiatric history of tardive dyskinesia, bipolar 1 disorder, PTSD, MDD, and GAD, with pertinent medical comorbidities/history that include obesity, hyperlipidemia, essential hypertension, frequent falls, chronic lower back pain, diabetes type 2, and osteoarthritis of the  right knee, who presented this encounter by way of EMS for concerns for destabilization of the patient's mental health, who upon EDP evaluation, consulted psychiatry for specialty evaluation and recommendations.  Patient currently is medically clear, as well as voluntary, per EDP team.  Patient seen today at the [facility] emergency department for face-to-face psychiatric reevaluation.  Upon follow-up evaluation, patient endorses that he is doing, good, and that, just been waiting I guess for them to get me into assisted living, and presents with a neutral to mildly constricted affect, with fair eye contact, and an appropriate and congruent interpersonal style.  Discussed with patient that actually currently he has been recommended for inpatient mental health hospitalization, given concerns for his mental health, as well as concerns expressed by his roommate, to which patient endorsed that he did not understand why, and stated that, well, I mean outside of not being able to take care of myself, I feel like my medicines are fine, and I'm not depressed, so, but I guess we can do what we need to do, because I don't have anyway really to care for myself, it's been getting bad.   Prompted to expand on this, patient endorses that he has been repeatedly falling lately, and his medical comorbidities have been taking a significant toll on him, which has ultimately led to increased conflict with his friend whom he states that he has been living with for several years, who he recently he states got into a very heated argument with about needing to transition into assisted living,  that has ultimately led to him being here now in the emergency department.  Patient orientation is intact, no concerns for fluctuations in consciousness.  Patient endorses no suicidal and homicidal ideations, denies suicide attempts, denies any history of self-injurious behavior, past or present.  Patient endorses no auditory or visual  hallucinations, and objectively, does not appear to be presenting with psychotic features.  Patient endorses that he is tolerating his medications, denies any side effects, outside of his chronic tardive dyskinesia, which is objectively appreciable upon physical exam; appreciably has bilateral hand tremors when not holding his hands together, of which he takes medication for.  Patient endorses that today he is tired and fatigued, but that lately he has been feeling this way, so to some degree he states that this is normal unfortunately, but that he feels that he is sleeping and eating appropriately.  Discussed with patient his roommates reports of expressing desires to kill himself, to which patient rejects this, states that he does not remember ever saying this, but that if he did, it was likely out of anger, and that he if he did say it, did not mean it.   Patient endorses that to some degree after conflict with his roommate upon arrival, he did have some decompensation of his mental health, but that his mental health since being here has since improved and it is at baseline.  Discussed with patient that given reexamination conducted, as well as chart review and nursing notes, in discussion with the EDP team, recommendation would be for psychiatric clearance, as well as additional recommendations above.  Chart review/nursing: Has been behaviorally appropriate, medication compliant, eating and sleeping appropriately, no concerns for safety, no expressions of thoughts of suicide and or homicidal ideations, and/or presentation with psychotic features.  Review of Systems  Constitutional:  Positive for malaise/fatigue.  Cardiovascular:  Negative for chest pain.  Gastrointestinal:  Negative for abdominal pain, constipation, diarrhea, nausea and vomiting.  Musculoskeletal:  Positive for falls.  Neurological:  Positive for weakness. Negative for seizures and loss of consciousness.   Psychiatric/Behavioral:  Negative for depression, hallucinations, substance abuse and suicidal ideas. The patient is not nervous/anxious and does not have insomnia.   All other systems reviewed and are negative.    Psychiatric and Social History  Psychiatric History:  Information collected from chart review/patient  Prev Dx/Sx: As above Current Psych Provider: Harl Zane BRAVO, NP - [facility] Home Meds (current): See MAR Previous Med Trials: See chart Therapy: None  Prior Psych Hospitalization: Yes, none recently Prior Self Harm: Denies Prior Violence: Denies, but admits to getting angry with roommate at times  Family Psych History: Denies Family Hx suicide: Denies  Social History:  Developmental Hx: None reported Educational Hx: None reported Occupational Hx: SSDI Legal Hx: None reported Living Situation: Was living with friend just recently for many years Spiritual Hx: None reported  Access to weapons/lethal means: Denies having any personal, but reports roommate has guns that are locked up  Substance History Alcohol: Denies  Tobacco: Denies  Illicit drugs: Denies  Prescription drug abuse: Denies  Rehab hx: Denies   Exam Findings  Physical Exam: As below Vital Signs:  Temp:  [97.5 F (36.4 C)-98.6 F (37 C)] 98.2 F (36.8 C) (10/22 0923) Pulse Rate:  [64-77] 70 (10/22 0923) Resp:  [15-19] 19 (10/22 0923) BP: (120-148)/(79-93) 129/93 (10/22 0923) SpO2:  [93 %-98 %] 93 % (10/22 0923) Blood pressure (!) 129/93, pulse 70, temperature 98.2 F (36.8 C), resp. rate 19, height  5' 10 (1.778 m), weight 93 kg, SpO2 93%. Body mass index is 29.42 kg/m.  Physical Exam Vitals and nursing note reviewed. Exam conducted with a chaperone present.  Constitutional:      General: He is not in acute distress.    Appearance: He is obese. He is not ill-appearing, toxic-appearing or diaphoretic.  Pulmonary:     Effort: Pulmonary effort is normal.  Skin:    General: Skin is warm  and dry.  Neurological:     Mental Status: He is alert and oriented to person, place, and time.     Motor: Weakness present. No tremor or seizure activity.  Psychiatric:        Attention and Perception: Attention and perception normal. He does not perceive auditory or visual hallucinations.        Mood and Affect: Mood normal.        Speech: Speech normal.        Behavior: Behavior is cooperative.        Thought Content: Thought content normal. Thought content is not paranoid or delusional. Thought content does not include homicidal or suicidal ideation.        Cognition and Memory: Cognition and memory normal.        Judgment: Judgment normal.     Comments: Affect: neutral to mildly constricted    Mental Status Exam: General Appearance: Disheveled and unkept Caucasian obese male in scrubs  Orientation:  Full (Time, Place, and Person)  Memory:  Immediate;   Fair Recent;   Fair Remote;   Fair  Concentration:  Concentration: Fair and Attention Span: Fair  Recall:  Fair  Attention  Fair  Eye Contact:  Fair  Speech:  Clear and Coherent  Language:  Fair  Volume:  Normal  Mood: Good  Affect:  Appropriate  Thought Process:  Coherent and Goal Directed  Thought Content:  Logical  Suicidal Thoughts:  No  Homicidal Thoughts:  No  Judgement:  Intact  Insight:  Present  Psychomotor Activity:  Normal  Akathisia:  No  Fund of Knowledge:  Fair      Assets:  Manufacturing systems engineer Desire for Improvement Financial Resources/Insurance Leisure Time Resilience Social Support Talents/Skills Vocational/Educational  Cognition:  WNL  ADL's:  Impaired  AIMS (if indicated):   0     Other History   These have been pulled in through the EMR, reviewed, and updated if appropriate.  Family History:  The patient's family history includes Anxiety disorder in his father and mother; Cancer in his paternal grandfather; Depression in his brother; Diabetes in his father; Heart attack in his maternal  grandfather; Heart disease in his mother; Heart failure in his paternal grandmother; Kidney failure in his father; Mental illness in his sister; Thyroid  disease in his brother and sister.  Medical History: Past Medical History:  Diagnosis Date   Anxiety    Arthritis    Chest heaviness    Chills    Depression, major    Diabetes mellitus without complication (HCC)    Dizzy    Dyslipidemia    ED (erectile dysfunction)    Elevated CPK    GERD (gastroesophageal reflux disease)    HA (headache)    Hypertension    Hypertriglyceridemia    Insomnia    Kidney stone    Left-sided Bell's palsy    Morbid obesity (HCC)    Nausea    No appetite    Panniculitis    of right lower leg   Parkinson's disease (  HCC)    PTSD (post-traumatic stress disorder)    Routine screening for STI (sexually transmitted infection) 01/05/2023   Weakness     Surgical History: Past Surgical History:  Procedure Laterality Date   NO PAST SURGERIES       Medications:   Current Facility-Administered Medications:    acetaminophen  (TYLENOL ) tablet 650 mg, 650 mg, Oral, Q4H PRN, Trifan, Matthew J, MD, 650 mg at 02/25/24 2358   atorvastatin  (LIPITOR) tablet 20 mg, 20 mg, Oral, Daily, Trifan, Donnice PARAS, MD, 20 mg at 02/29/24 9073   benztropine  (COGENTIN ) tablet 1 mg, 1 mg, Oral, BID, Trifan, Donnice PARAS, MD, 1 mg at 02/29/24 9073   divalproex  (DEPAKOTE ) DR tablet 500 mg, 500 mg, Oral, BID, Trifan, Donnice PARAS, MD, 500 mg at 02/29/24 9073   DULoxetine  (CYMBALTA ) DR capsule 40 mg, 40 mg, Oral, BID, Trifan, Donnice PARAS, MD, 40 mg at 02/29/24 9072   glimepiride  (AMARYL ) tablet 4 mg, 4 mg, Oral, BID WC, Trifan, Matthew J, MD, 4 mg at 02/29/24 9074   hydrOXYzine  (ATARAX ) tablet 25 mg, 25 mg, Oral, TID PRN, Trifan, Matthew J, MD, 25 mg at 02/27/24 1834   lisinopril  (ZESTRIL ) tablet 20 mg, 20 mg, Oral, Daily, Trifan, Donnice PARAS, MD, 20 mg at 02/29/24 9073   metFORMIN  (GLUCOPHAGE ) tablet 500 mg, 500 mg, Oral, BID, Trifan, Donnice PARAS, MD, 500 mg at 02/29/24 9072   propranolol  (INDERAL ) tablet 10 mg, 10 mg, Oral, TID, Trifan, Donnice PARAS, MD, 10 mg at 02/29/24 9072   valbenazine  (INGREZZA ) capsule 80 mg, 80 mg, Oral, Daily, Trifan, Donnice PARAS, MD, 80 mg at 02/29/24 9072  Current Outpatient Medications:    atorvastatin  (LIPITOR) 20 MG tablet, TAKE 1 TABLET BY MOUTH DAILY (Patient taking differently: Take 20 mg by mouth at bedtime.), Disp: 30 tablet, Rfl: 1   benztropine  (COGENTIN ) 1 MG tablet, Take 1 tablet (1 mg total) by mouth 2 (two) times daily., Disp: 60 tablet, Rfl: 3   divalproex  (DEPAKOTE ) 500 MG DR tablet, Take 1 tablet (500 mg total) by mouth 2 (two) times daily., Disp: 60 tablet, Rfl: 3   DULoxetine  HCl 40 MG CPEP, Take 40 mg by mouth 2 (two) times daily., Disp: , Rfl:    glimepiride  (AMARYL ) 4 MG tablet, TAKE 1 TABLET (4 MG TOTAL) BY MOUTH 2 (TWO) TIMES DAILY., Disp: 180 tablet, Rfl: 1   hydrOXYzine  (ATARAX ) 50 MG tablet, TAKE 1 TABLET BY MOUTH FOUR TIMES A DAY & TAKE 2 TABLETS EVERY NIGHT AT BEDTIME FOR ANXIETY, PANIC, AGITATION, AND SLEEP (Patient taking differently: Take 100 mg by mouth at bedtime.), Disp: 120 tablet, Rfl: 3   lisinopril  (ZESTRIL ) 20 MG tablet, TAKE 1 TABLET BY MOUTH DAILY, Disp: 90 tablet, Rfl: 1   metFORMIN  (GLUCOPHAGE ) 500 MG tablet, TAKE 1 TABLET (500 MG TOTAL) BY MOUTH 2 (TWO) TIMES DAILY., Disp: 60 tablet, Rfl: 3   propranolol  (INDERAL ) 10 MG tablet, Take 1 tablet (10 mg total) by mouth 3 (three) times daily., Disp: 90 tablet, Rfl: 1   valbenazine  (INGREZZA ) 80 MG capsule, Take 1 capsule (80 mg total) by mouth daily., Disp: 30 capsule, Rfl: 3   sildenafil  (REVATIO ) 20 MG tablet, Take 1 tablet (20 mg total) by mouth daily as needed (as directed for E.D.). (Patient not taking: Reported on 02/26/2024), Disp: 10 tablet, Rfl: 2  Allergies: No Known Allergies  [name] PARAS Gravely, NP

## 2024-03-01 DIAGNOSIS — F3130 Bipolar disorder, current episode depressed, mild or moderate severity, unspecified: Secondary | ICD-10-CM | POA: Diagnosis not present

## 2024-03-01 NOTE — Evaluation (Signed)
 Occupational Therapy Evaluation Patient Details Name: [name] [name] MRN: [id number] DOB: 12/22/1965 Today's Date: 03/01/2024   History of Present Illness   Pt is a 58 y.o. male who presented 02/25/24 with concern for erratic behavior and potential SI. PMHx: Parkinson's, behavioral disturbance, Bipolar depression, PTSD, anxiety, IIDM, HTN, HLD, PD, tremor, tardive dyskinesia, chronic back pain, L-sided Bell's palsy     Clinical Impressions PTA, pt lives with a friend, reports typically Modified Independent with ADLs, household IADLs and mobility using a RW though friend not present to confirm PLOF. Pt presents now with deficits in cognition, standing balance, coordination, endurance and vision. Pt requiring Min-Mod A for mobility around unit using RW w/ constant hands on assist needed to correct balance and prevent veering to the R side. Pt requires Supervision for UB ADL and Mod A for LB ADLs. Feel there are peripheral vision deficits based on evaluation though difficulty fully assessing d/t pt cognition. As pt is at high risk for falls, recommend continued inpatient follow up therapy, <3 hours/day at DC.     If plan is discharge home, recommend the following:   A lot of help with walking and/or transfers;A lot of help with bathing/dressing/bathroom;Assistance with cooking/housework;Direct supervision/assist for financial management;Direct supervision/assist for medications management;Assist for transportation;Supervision due to cognitive status     Functional Status Assessment   Patient has had a recent decline in their functional status and demonstrates the ability to make significant improvements in function in a reasonable and predictable amount of time.     Equipment Recommendations   BSC/3in1     Recommendations for Other Services         Precautions/Restrictions   Precautions Precautions: Fall Restrictions Weight Bearing Restrictions Per Provider Order: No      Mobility Bed Mobility Overal bed mobility: Needs Assistance Bed Mobility: Supine to Sit, Sit to Supine     Supine to sit: Min assist Sit to supine: Contact guard assist        Transfers Overall transfer level: Needs assistance Equipment used: Rolling walker (2 wheels) Transfers: Sit to/from Stand Sit to Stand: Min assist                  Balance Overall balance assessment: Needs assistance Sitting-balance support: Bilateral upper extremity supported, Feet supported Sitting balance-Leahy Scale: Poor     Standing balance support: Bilateral upper extremity supported, During functional activity, Reliant on assistive device for balance Standing balance-Leahy Scale: Poor                             ADL either performed or assessed with clinical judgement   ADL Overall ADL's : Needs assistance/impaired Eating/Feeding: Independent   Grooming: Contact guard assist;Standing;Wash/dry face Grooming Details (indicate cue type and reason): assist for balance, sequencing task appropriately Upper Body Bathing: Supervision/ safety;Sitting   Lower Body Bathing: Moderate assistance;Sitting/lateral leans;Sit to/from stand   Upper Body Dressing : Supervision/safety;Sitting   Lower Body Dressing: Moderate assistance;Sitting/lateral leans;Sit to/from stand   Toilet Transfer: Minimal assistance;Moderate assistance;Ambulation;Rolling walker (2 wheels) Toilet Transfer Details (indicate cue type and reason): assist for safe descent down to toilet, cues to use grab bars Toileting- Clothing Manipulation and Hygiene: Moderate assistance;Sit to/from stand;Sitting/lateral lean Toileting - Clothing Manipulation Details (indicate cue type and reason): assist for balance while pt maintaining clothing mgmt. pt requiring assist for peri care due to imbalance in standing     Functional mobility during ADLs: Minimal assistance;Moderate assistance;Rolling walker (  2 wheels)        Vision Baseline Vision/History: 1 Wears glasses Ability to See in Adequate Light: 1 Impaired Patient Visual Report: Other (comment) (to be further assessed) Vision Assessment?: Vision impaired- to be further tested in functional context Additional Comments: during tasks, pt consistently veering to the R side requiring assist to correct. when assessing, pt able to turn head fully to B sides and identify objects, attempts to read print under tv as well. Attempted peripheral testing with pt either very impaired peripherally or with difficulty understanding directions. R peripheral vision impaired more than L as pt does not report seeing marker until it was directly in front of his eyes when coming from R side     Perception         Praxis         Pertinent Vitals/Pain Pain Assessment Pain Assessment: No/denies pain     Extremity/Trunk Assessment Upper Extremity Assessment Upper Extremity Assessment: Right hand dominant;RUE deficits/detail;LUE deficits/detail RUE Coordination: decreased fine motor LUE Coordination: decreased fine motor   Lower Extremity Assessment Lower Extremity Assessment: Defer to PT evaluation   Cervical / Trunk Assessment Cervical / Trunk Assessment: Normal   Communication Communication Communication: Impaired Factors Affecting Communication: Reduced clarity of speech   Cognition Arousal: Alert Behavior During Therapy: Flat affect Cognition: Cognition impaired     Awareness: Online awareness impaired, Intellectual awareness impaired Memory impairment (select all impairments): Short-term memory, Working Civil Service fast streamer, Conservation officer, historic buildings Attention impairment (select first level of impairment): Sustained attention, Selective attention Executive functioning impairment (select all impairments): Sequencing, Reasoning, Problem solving OT - Cognition Comments: pleasant, flat affect. difficulty following directions for visual assessments but able to  follow simple task-oriented prompts. shows some insight into balance deficits but decreased overall awareness of fall risk                 Following commands: Impaired Following commands impaired: Follows one step commands with increased time, Follows multi-step commands inconsistently, Follows multi-step commands with increased time     Cueing  General Comments   Cueing Techniques: Verbal cues;Tactile cues;Gestural cues;Visual cues      Exercises     Shoulder Instructions      Home Living Family/patient expects to be discharged to:: Private residence Living Arrangements: Non-relatives/Friends Available Help at Discharge: Available 24 hours/day (but is not able to physically assist, [name] has an aide to assist her) Type of Home: House Home Access: Stairs to enter [employer] of Steps: 2 Entrance Stairs-Rails: Right Home Layout: One level     Bathroom Shower/Tub: Producer, television/film/video: Standard     Home Equipment: Agricultural consultant (2 wheels);Shower seat   Additional Comments: per chart, pt likely not able to safely DC back to this location. TBD      Prior Functioning/Environment Prior Level of Function : Independent/Modified Independent;History of Falls (last six months);Patient poor historian/Family not available             Mobility Comments: reports using RW most of the time in the home ADLs Comments: Reports Mod I for ADLs, can do simple IADLs    OT Problem List: Decreased strength;Decreased activity tolerance;Impaired balance (sitting and/or standing);Decreased coordination;Decreased cognition;Decreased safety awareness;Decreased knowledge of use of DME or AE   OT Treatment/Interventions: Self-care/ADL training;Therapeutic exercise;Energy conservation;DME and/or AE instruction;Therapeutic activities;Patient/family education;Balance training      OT Goals(Current goals can be found in the care plan section)   Acute Rehab OT  Goals Patient Stated Goal: work  on balance, not feel so tired when walking OT Goal Formulation: With patient Time For Goal Achievement: 03/15/24 Potential to Achieve Goals: Good ADL Goals Pt Will Perform Lower Body Bathing: with contact guard assist;sit to/from stand;sitting/lateral leans Pt Will Perform Lower Body Dressing: with contact guard assist;sit to/from stand;sitting/lateral leans Pt Will Transfer to Toilet: with contact guard assist;ambulating Pt Will Perform Toileting - Clothing Manipulation and hygiene: with contact guard assist;sitting/lateral leans;sit to/from stand   OT Frequency:  Min 2X/week    Co-evaluation              AM-PAC OT 6 Clicks Daily Activity     Outcome Measure Help from another person eating meals?: None Help from another person taking care of personal grooming?: A Little Help from another person toileting, which includes using toliet, bedpan, or urinal?: A Lot Help from another person bathing (including washing, rinsing, drying)?: A Lot Help from another person to put on and taking off regular upper body clothing?: A Little Help from another person to put on and taking off regular lower body clothing?: A Lot 6 Click Score: 16   End of Session Equipment Utilized During Treatment: Gait belt;Rolling walker (2 wheels) Nurse Communication: Mobility status  Activity Tolerance: Patient tolerated treatment well Patient left: in bed;with bed alarm set  OT Visit Diagnosis: Other abnormalities of gait and mobility (R26.89);Unsteadiness on feet (R26.81);Muscle weakness (generalized) (M62.81);History of falling (Z91.81)                Time: 9067-9049 OT Time Calculation (min): 18 min Charges:  OT General Charges $OT Visit: 1 Visit OT Evaluation $OT Eval Moderate Complexity: 1 Mod  Mliss NOVAK, OTR/L Acute Rehab Services Office: [phone]   Mliss Fish 03/01/2024, 10:08 AM

## 2024-03-01 NOTE — Progress Notes (Addendum)
 PASRR remains pending manual review.  Pt has no SNF offers, multiple denials.  Barriers to SNF placement include pt's insurance and behaviors.   1250: PASRR received, [id number] E expires 03/31/24.  Pt had a SNF offer from [facility] in [place] but has been rescinded due to recent SI.  Spoke to pt's friend [name] [phone] and explained barriers to SNF placement.  Per [name], pt has been living with her for the last 2 years and she is planning to evict him in the next few months.  [name] is agreeable with pt returning home tomorrow and is planning to have pt's guns removed from the home today.  HHRN/NA arranged with [facility].   Julien Das, MSW, LCSW [phone] (coverage)

## 2024-03-01 NOTE — ED Notes (Signed)
 Patient assisted in using bedside commode.

## 2024-03-01 NOTE — ED Provider Notes (Signed)
 Emergency Medicine Observation Re-evaluation Note  [name] [name] is a 59 y.o. male, seen on rounds today.  Pt initially presented to the ED for complaints of Altered Mental Status Currently, the patient is awaiting skilled nursing facility placement.  Patient was initially seen and evaluated for falls and altered mental status.  There is also concern for erratic behavior.  Patient was seen by psychiatry and initially advised admission but have since cleared the patient from a psychiatric standpoint. He is generally weak and is unable to ambulate and per PT has had a functional decline in status and now requires a wheelchair.  Additionally, the person with whom he has lived refuses to accept him back to the home.     Physical Exam  BP 125/86 (BP Location: Left Arm)   Pulse 68   Temp 98.3 F (36.8 C) (Oral)   Resp 18   Ht 1.778 m (5' 10)   Wt 93 kg   SpO2 94%   BMI 29.42 kg/m  Physical Exam General: Well-developed well-nourished male who appears generally weak Cardiac: Heart rate is normal with normal blood pressure Lungs: Normal respiratory rate and oxygen saturations Psych: Patient denies SI or HI but does have some trailing off and mumbling while talking to him.  ED Course / MDM  EKG:   I have reviewed the labs performed to date as well as medications administered while in observation.  Recent changes in the last 24 hours include patient was cleared by psychiatry and social work is attempting placement.  Currently has no offers for placement.  Plan  Current plan is for PASSR pending, multiple SNF denials, awaiting placement.    Levander Houston, MD 03/01/24 0830

## 2024-03-01 NOTE — TOC Progression Note (Signed)
 Transition of Care [facility]) - Progression Note    Patient Details  Name: [name] [name] MRN: [id number] Date of Birth: 23-Sep-1965  Transition of Care [facility]) CM/SW Contact  Corean JAYSON Canary, RN Phone Number: 03/01/2024, 2:51 PM  Clinical Narrative:      So far no acceptances for SNF. Messaged Artavia at [facility]- She is aware that the patient will likely DC tomorrow. Added PT and OT to orders, she is aware Plan to discharge in Am to home with home health if no acceptance to SNF                     Expected Discharge Plan and Services                                   HH Arranged: RN, Nurse's Aide [facility] Agency: [facility] ([facility]) Date [facility] Agency Contacted: 02/29/24   Representative spoke with at [facility] Agency: [facility]   Social Drivers of Health (SDOH) Interventions SDOH Screenings   Food Insecurity: No Food Insecurity (11/06/2022)  Housing: Low Risk  (11/06/2022)  Transportation Needs: No Transportation Needs (11/06/2022)  Utilities: Not At Risk (11/06/2022)  Depression (PHQ2-9): High Risk (01/26/2024)  Social Connections: Unknown (09/22/2021)   Received from [facility]  Tobacco Use: Low Risk  (02/29/2024)    Readmission Risk Interventions     No data to display

## 2024-03-02 ENCOUNTER — Other Ambulatory Visit: Payer: Self-pay

## 2024-03-02 ENCOUNTER — Inpatient Hospital Stay (HOSPITAL_COMMUNITY)
Admission: EM | Admit: 2024-03-02 | Discharge: 2024-06-08 | DRG: 071 | Disposition: A | Discharge status: Other Acute Inpatient Hospital | Attending: Internal Medicine | Admitting: Internal Medicine

## 2024-03-02 DIAGNOSIS — F411 Generalized anxiety disorder: Secondary | ICD-10-CM | POA: Diagnosis present

## 2024-03-02 DIAGNOSIS — F99 Mental disorder, not otherwise specified: Secondary | ICD-10-CM | POA: Diagnosis present

## 2024-03-02 DIAGNOSIS — Z79899 Other long term (current) drug therapy: Secondary | ICD-10-CM

## 2024-03-02 DIAGNOSIS — Z8249 Family history of ischemic heart disease and other diseases of the circulatory system: Secondary | ICD-10-CM

## 2024-03-02 DIAGNOSIS — Z515 Encounter for palliative care: Secondary | ICD-10-CM

## 2024-03-02 DIAGNOSIS — Z6826 Body mass index (BMI) 26.0-26.9, adult: Secondary | ICD-10-CM

## 2024-03-02 DIAGNOSIS — Z8673 Personal history of transient ischemic attack (TIA), and cerebral infarction without residual deficits: Secondary | ICD-10-CM

## 2024-03-02 DIAGNOSIS — Z8349 Family history of other endocrine, nutritional and metabolic diseases: Secondary | ICD-10-CM

## 2024-03-02 DIAGNOSIS — Z604 Social exclusion and rejection: Secondary | ICD-10-CM | POA: Diagnosis present

## 2024-03-02 DIAGNOSIS — F319 Bipolar disorder, unspecified: Secondary | ICD-10-CM | POA: Diagnosis present

## 2024-03-02 DIAGNOSIS — Z91199 Patient's noncompliance with other medical treatment and regimen due to unspecified reason: Secondary | ICD-10-CM

## 2024-03-02 DIAGNOSIS — Z638 Other specified problems related to primary support group: Secondary | ICD-10-CM

## 2024-03-02 DIAGNOSIS — Z66 Do not resuscitate: Secondary | ICD-10-CM | POA: Insufficient documentation

## 2024-03-02 DIAGNOSIS — F4310 Post-traumatic stress disorder, unspecified: Secondary | ICD-10-CM | POA: Diagnosis present

## 2024-03-02 DIAGNOSIS — R410 Disorientation, unspecified: Secondary | ICD-10-CM | POA: Diagnosis present

## 2024-03-02 DIAGNOSIS — Y92230 Patient room in hospital as the place of occurrence of the external cause: Secondary | ICD-10-CM | POA: Diagnosis not present

## 2024-03-02 DIAGNOSIS — E11649 Type 2 diabetes mellitus with hypoglycemia without coma: Secondary | ICD-10-CM | POA: Diagnosis present

## 2024-03-02 DIAGNOSIS — R45851 Suicidal ideations: Secondary | ICD-10-CM | POA: Diagnosis present

## 2024-03-02 DIAGNOSIS — G9341 Metabolic encephalopathy: Principal | ICD-10-CM | POA: Diagnosis present

## 2024-03-02 DIAGNOSIS — Z818 Family history of other mental and behavioral disorders: Secondary | ICD-10-CM

## 2024-03-02 DIAGNOSIS — Z8419 Family history of other disorders of kidney and ureter: Secondary | ICD-10-CM

## 2024-03-02 DIAGNOSIS — Z9181 History of falling: Secondary | ICD-10-CM

## 2024-03-02 DIAGNOSIS — T4275XA Adverse effect of unspecified antiepileptic and sedative-hypnotic drugs, initial encounter: Secondary | ICD-10-CM | POA: Diagnosis not present

## 2024-03-02 DIAGNOSIS — M25561 Pain in right knee: Secondary | ICD-10-CM | POA: Diagnosis not present

## 2024-03-02 DIAGNOSIS — R531 Weakness: Secondary | ICD-10-CM

## 2024-03-02 DIAGNOSIS — W1830XA Fall on same level, unspecified, initial encounter: Secondary | ICD-10-CM | POA: Diagnosis not present

## 2024-03-02 DIAGNOSIS — R0902 Hypoxemia: Secondary | ICD-10-CM | POA: Diagnosis not present

## 2024-03-02 DIAGNOSIS — I10 Essential (primary) hypertension: Secondary | ICD-10-CM | POA: Diagnosis present

## 2024-03-02 DIAGNOSIS — G2401 Drug induced subacute dyskinesia: Secondary | ICD-10-CM | POA: Diagnosis present

## 2024-03-02 DIAGNOSIS — I2510 Atherosclerotic heart disease of native coronary artery without angina pectoris: Secondary | ICD-10-CM | POA: Diagnosis present

## 2024-03-02 DIAGNOSIS — E860 Dehydration: Secondary | ICD-10-CM | POA: Diagnosis present

## 2024-03-02 DIAGNOSIS — Z781 Physical restraint status: Secondary | ICD-10-CM

## 2024-03-02 DIAGNOSIS — G9340 Encephalopathy, unspecified: Secondary | ICD-10-CM | POA: Diagnosis present

## 2024-03-02 DIAGNOSIS — R54 Age-related physical debility: Secondary | ICD-10-CM | POA: Diagnosis present

## 2024-03-02 DIAGNOSIS — G8929 Other chronic pain: Principal | ICD-10-CM | POA: Diagnosis present

## 2024-03-02 DIAGNOSIS — Z7984 Long term (current) use of oral hypoglycemic drugs: Secondary | ICD-10-CM

## 2024-03-02 DIAGNOSIS — E440 Moderate protein-calorie malnutrition: Secondary | ICD-10-CM | POA: Diagnosis present

## 2024-03-02 DIAGNOSIS — R4189 Other symptoms and signs involving cognitive functions and awareness: Secondary | ICD-10-CM | POA: Diagnosis present

## 2024-03-02 DIAGNOSIS — Z833 Family history of diabetes mellitus: Secondary | ICD-10-CM

## 2024-03-02 DIAGNOSIS — L8990 Pressure ulcer of unspecified site, unspecified stage: Secondary | ICD-10-CM | POA: Diagnosis present

## 2024-03-02 DIAGNOSIS — K121 Other forms of stomatitis: Secondary | ICD-10-CM | POA: Diagnosis not present

## 2024-03-02 DIAGNOSIS — Z87442 Personal history of urinary calculi: Secondary | ICD-10-CM

## 2024-03-02 DIAGNOSIS — E1165 Type 2 diabetes mellitus with hyperglycemia: Secondary | ICD-10-CM | POA: Diagnosis present

## 2024-03-02 DIAGNOSIS — Z608 Other problems related to social environment: Secondary | ICD-10-CM | POA: Diagnosis present

## 2024-03-02 DIAGNOSIS — F3130 Bipolar disorder, current episode depressed, mild or moderate severity, unspecified: Secondary | ICD-10-CM | POA: Diagnosis not present

## 2024-03-02 DIAGNOSIS — R251 Tremor, unspecified: Secondary | ICD-10-CM | POA: Diagnosis present

## 2024-03-02 DIAGNOSIS — I952 Hypotension due to drugs: Secondary | ICD-10-CM | POA: Diagnosis not present

## 2024-03-02 DIAGNOSIS — R638 Other symptoms and signs concerning food and fluid intake: Secondary | ICD-10-CM | POA: Diagnosis not present

## 2024-03-02 DIAGNOSIS — Z5900 Homelessness unspecified: Secondary | ICD-10-CM

## 2024-03-02 DIAGNOSIS — Z7189 Other specified counseling: Secondary | ICD-10-CM

## 2024-03-02 DIAGNOSIS — L89151 Pressure ulcer of sacral region, stage 1: Secondary | ICD-10-CM | POA: Diagnosis not present

## 2024-03-02 DIAGNOSIS — K219 Gastro-esophageal reflux disease without esophagitis: Secondary | ICD-10-CM | POA: Diagnosis present

## 2024-03-02 DIAGNOSIS — Z1152 Encounter for screening for COVID-19: Secondary | ICD-10-CM

## 2024-03-02 DIAGNOSIS — E782 Mixed hyperlipidemia: Secondary | ICD-10-CM | POA: Diagnosis present

## 2024-03-02 DIAGNOSIS — G4700 Insomnia, unspecified: Secondary | ICD-10-CM | POA: Diagnosis present

## 2024-03-02 DIAGNOSIS — M545 Low back pain, unspecified: Secondary | ICD-10-CM | POA: Diagnosis present

## 2024-03-02 DIAGNOSIS — G20A1 Parkinson's disease without dyskinesia, without mention of fluctuations: Secondary | ICD-10-CM | POA: Diagnosis present

## 2024-03-02 DIAGNOSIS — F54 Psychological and behavioral factors associated with disorders or diseases classified elsewhere: Secondary | ICD-10-CM | POA: Diagnosis present

## 2024-03-02 DIAGNOSIS — Z751 Person awaiting admission to adequate facility elsewhere: Secondary | ICD-10-CM

## 2024-03-02 DIAGNOSIS — R296 Repeated falls: Secondary | ICD-10-CM | POA: Diagnosis present

## 2024-03-02 LAB — BASIC METABOLIC PANEL WITH GFR
Anion gap: 11 (ref 5–15)
BUN: 18 mg/dL (ref 6–20)
CO2: 28 mmol/L (ref 22–32)
Calcium: 10 mg/dL (ref 8.9–10.3)
Chloride: 97 mmol/L — ABNORMAL LOW (ref 98–111)
Creatinine, Ser: 1.13 mg/dL (ref 0.61–1.24)
GFR, Estimated: >60 mL/min (ref >60)
Glucose, Bld: 98 mg/dL (ref 70–99)
Potassium: 4.5 mmol/L (ref 3.5–5.1)
Sodium: 135 mmol/L (ref 135–145)

## 2024-03-02 LAB — CBC WITH DIFFERENTIAL/PLATELET
Abs Immature Granulocytes: 0.04 K/uL (ref 0.00–0.07)
Basophils Absolute: 0.1 K/uL (ref 0.0–0.1)
Basophils Relative: 1 %
Eosinophils Absolute: 0.1 K/uL (ref 0.0–0.5)
Eosinophils Relative: 1 %
HCT: 51.9 % (ref 39.0–52.0)
Hemoglobin: 17.1 g/dL — ABNORMAL HIGH (ref 13.0–17.0)
Immature Granulocytes: 1 %
Lymphocytes Relative: 32 %
Lymphs Abs: 2.8 K/uL (ref 0.7–4.0)
MCH: 28.3 pg (ref 26.0–34.0)
MCHC: 32.9 g/dL (ref 30.0–36.0)
MCV: 85.8 fL (ref 80.0–100.0)
Monocytes Absolute: 0.8 K/uL (ref 0.1–1.0)
Monocytes Relative: 9 %
Neutro Abs: 5 K/uL (ref 1.7–7.7)
Neutrophils Relative %: 56 %
Platelets: 313 K/uL (ref 150–400)
RBC: 6.05 MIL/uL — ABNORMAL HIGH (ref 4.22–5.81)
RDW: 13.7 % (ref 11.5–15.5)
WBC: 8.7 K/uL (ref 4.0–10.5)
nRBC: 0 % (ref 0.0–0.2)

## 2024-03-02 MED ORDER — METFORMIN HCL 500 MG PO TABS
500.0000 mg | ORAL_TABLET | Freq: Two times a day (BID) | ORAL | Status: DC
  Administered 2024-03-02 – 2024-03-07 (×11): 500 mg via ORAL
  Filled 2024-03-02 (×12): qty 1

## 2024-03-02 MED ORDER — PROPRANOLOL HCL 20 MG PO TABS
10.0000 mg | ORAL_TABLET | Freq: Three times a day (TID) | ORAL | Status: DC
Start: 2024-03-02 — End: 2024-03-13
  Administered 2024-03-02 – 2024-03-12 (×28): 10 mg via ORAL
  Filled 2024-03-02 (×31): qty 1

## 2024-03-02 MED ORDER — ATORVASTATIN CALCIUM 10 MG PO TABS
20.0000 mg | ORAL_TABLET | Freq: Every day | ORAL | Status: DC
  Administered 2024-03-03 – 2024-06-08 (×97): 20 mg via ORAL
  Filled 2024-03-02 (×22): qty 2
  Filled 2024-03-02: qty 1
  Filled 2024-03-02 (×6): qty 2
  Filled 2024-03-02: qty 1
  Filled 2024-03-02 (×8): qty 2
  Filled 2024-03-02: qty 1
  Filled 2024-03-02 (×6): qty 2
  Filled 2024-03-02: qty 1
  Filled 2024-03-02 (×14): qty 2
  Filled 2024-03-02: qty 1
  Filled 2024-03-02 (×28): qty 2
  Filled 2024-03-02: qty 1
  Filled 2024-03-02: qty 2

## 2024-03-02 MED ORDER — DIVALPROEX SODIUM 250 MG PO DR TAB
500.0000 mg | DELAYED_RELEASE_TABLET | Freq: Two times a day (BID) | ORAL | Status: DC
Start: 2024-03-02 — End: 2024-03-09
  Administered 2024-03-02 – 2024-03-08 (×13): 500 mg via ORAL
  Filled 2024-03-02: qty 1
  Filled 2024-03-02 (×2): qty 2
  Filled 2024-03-02 (×5): qty 1
  Filled 2024-03-02: qty 2
  Filled 2024-03-02 (×5): qty 1

## 2024-03-02 MED ORDER — VALBENAZINE TOSYLATE 40 MG PO CAPS
80.0000 mg | ORAL_CAPSULE | Freq: Every day | ORAL | Status: DC
  Administered 2024-03-03 – 2024-06-08 (×97): 80 mg via ORAL
  Filled 2024-03-02 (×91): qty 2

## 2024-03-02 MED ORDER — BENZTROPINE MESYLATE 1 MG PO TABS
1.0000 mg | ORAL_TABLET | Freq: Two times a day (BID) | ORAL | Status: DC
  Administered 2024-03-02 – 2024-04-10 (×76): 1 mg via ORAL
  Filled 2024-03-02 (×10): qty 1
  Filled 2024-03-02: qty 2
  Filled 2024-03-02 (×7): qty 1
  Filled 2024-03-02 (×3): qty 2
  Filled 2024-03-02 (×6): qty 1
  Filled 2024-03-02: qty 2
  Filled 2024-03-02 (×2): qty 1
  Filled 2024-03-02: qty 2
  Filled 2024-03-02 (×3): qty 1
  Filled 2024-03-02: qty 2
  Filled 2024-03-02 (×4): qty 1
  Filled 2024-03-02: qty 2
  Filled 2024-03-02: qty 1
  Filled 2024-03-02: qty 2
  Filled 2024-03-02 (×6): qty 1
  Filled 2024-03-02: qty 2
  Filled 2024-03-02 (×9): qty 1
  Filled 2024-03-02: qty 2
  Filled 2024-03-02 (×18): qty 1
  Filled 2024-03-02 (×2): qty 2
  Filled 2024-03-02 (×2): qty 1

## 2024-03-02 MED ORDER — LISINOPRIL 10 MG PO TABS
20.0000 mg | ORAL_TABLET | Freq: Every day | ORAL | Status: DC
Start: 2024-03-03 — End: 2024-03-13
  Administered 2024-03-03 – 2024-03-12 (×9): 20 mg via ORAL
  Filled 2024-03-02 (×10): qty 1

## 2024-03-02 MED ORDER — DULOXETINE HCL 20 MG PO CPEP
40.0000 mg | ORAL_CAPSULE | Freq: Two times a day (BID) | ORAL | Status: DC
  Administered 2024-03-02 – 2024-04-10 (×75): 40 mg via ORAL
  Filled 2024-03-02 (×78): qty 2

## 2024-03-02 MED ORDER — HYDROXYZINE HCL 25 MG PO TABS
100.0000 mg | ORAL_TABLET | Freq: Every day | ORAL | Status: DC
Start: 2024-03-02 — End: 2024-03-08
  Administered 2024-03-02 – 2024-03-07 (×6): 100 mg via ORAL
  Filled 2024-03-02 (×6): qty 4

## 2024-03-02 MED ORDER — GLIMEPIRIDE 4 MG PO TABS
4.0000 mg | ORAL_TABLET | Freq: Two times a day (BID) | ORAL | Status: DC
Start: 2024-03-03 — End: 2024-03-08
  Administered 2024-03-03 – 2024-03-07 (×9): 4 mg via ORAL
  Filled 2024-03-02 (×12): qty 1

## 2024-03-02 NOTE — TOC Transition Note (Signed)
 Transition of Care [facility]) - Discharge Note   Patient Details  Name: [name] [name] MRN: [id number] Date of Birth: 06-May-1966  Transition of [facility]) CM/SW Contact:  Nena LITTIE Coffee, RN Phone Number: 03/02/2024, 10:56 AM  Clinical Narrative:    Pt will dc with home health aide, RNCM, PT, OT services c/[facility]. [facility], pt's roommate Wendie and [facility] have been updated. [facility] is aware that pt needs RN case management to assist c/DSS application and that the ultimate goal is long-term placement. [facility] has been notified of transport. ED staff updated.   Final next level of care: Home w Home Health Services Barriers to Discharge: Barriers Resolved   HH Arranged: RN, PT, OT, Nurse's Aide HH Agency: [facility] ([facility] Date HH Agency Contacted: 02/29/24   Representative spoke with at [facility] Agency: [facility]  Social Drivers of Health (SDOH) Interventions SDOH Screenings   Food Insecurity: No Food Insecurity (11/06/2022)  Housing: Low Risk  (11/06/2022)  Transportation Needs: No Transportation Needs (11/06/2022)  Utilities: Not At Risk (11/06/2022)  Depression (PHQ2-9): High Risk (01/26/2024)  Social Connections: Unknown (09/22/2021)   Received from [facility]  Tobacco Use: Low Risk  (02/29/2024)   Readmission Risk Interventions     No data to display

## 2024-03-02 NOTE — ED Notes (Signed)
 [facility] here to transport patient home. [facility] (emergency contact) notified of same.

## 2024-03-02 NOTE — ED Provider Notes (Signed)
 [place] EMERGENCY DEPARTMENT AT [facility] Provider Note   CSN: [id number] Arrival date & time: 03/02/24  1440     Patient presents with: Placement   EAGAN SHIFFLETT is a 58 y.o. male.   This is a 58 year old male who is here today for placement.  Patient was at home over the last several days, was discharged back to his friend [facility] home, however when he arrived, she did not believe she can take care of him.  He returns here still seeking placement.  He has history of Parkinson's disease, essential tremor, PTSD, chronic low back pain.        Prior to Admission medications   Medication Sig Start Date End Date Taking? Authorizing Provider  atorvastatin  (LIPITOR) 20 MG tablet TAKE 1 TABLET BY MOUTH DAILY Patient taking differently: Take 20 mg by mouth at bedtime. 11/28/23   [name], [name]  benztropine  (COGENTIN ) 1 MG tablet Take 1 tablet (1 mg total) by mouth 2 (two) times daily. 01/26/24   Harl Zane BRAVO, NP  divalproex  (DEPAKOTE ) 500 MG DR tablet Take 1 tablet (500 mg total) by mouth 2 (two) times daily. 01/26/24   Harl Zane BRAVO, NP  DULoxetine  HCl 40 MG CPEP Take 40 mg by mouth 2 (two) times daily.    [provider]  glimepiride  (AMARYL ) 4 MG tablet TAKE 1 TABLET (4 MG TOTAL) BY MOUTH 2 (TWO) TIMES DAILY. 08/22/23   [name], [name]  hydrOXYzine  (ATARAX ) 50 MG tablet TAKE 1 TABLET BY MOUTH FOUR TIMES A DAY & TAKE 2 TABLETS EVERY NIGHT AT BEDTIME FOR ANXIETY, PANIC, AGITATION, AND SLEEP Patient taking differently: Take 100 mg by mouth at bedtime. 01/26/24   Harl Zane BRAVO, NP  lisinopril  (ZESTRIL ) 20 MG tablet TAKE 1 TABLET BY MOUTH DAILY 02/15/24   [name], [name]  metFORMIN  (GLUCOPHAGE ) 500 MG tablet TAKE 1 TABLET (500 MG TOTAL) BY MOUTH 2 (TWO) TIMES DAILY. 08/22/23   [name], [name]  propranolol  (INDERAL ) 10 MG tablet Take 1 tablet (10 mg total) by mouth 3 (three) times daily. 01/26/24   Harl Zane BRAVO, NP  sildenafil   (REVATIO ) 20 MG tablet Take 1 tablet (20 mg total) by mouth daily as needed (as directed for E.D.). Patient not taking: Reported on 02/26/2024 02/09/23   Duanne Butler DASEN, MD  valbenazine  (INGREZZA ) 80 MG capsule Take 1 capsule (80 mg total) by mouth daily. 01/26/24   Harl Zane BRAVO, NP    Allergies: Patient has no known allergies.    Review of Systems  Updated Vital Signs There were no vitals taken for this visit.  Physical Exam Vitals and nursing note reviewed.  Constitutional:      Appearance: Normal appearance.  Eyes:     Pupils: Pupils are equal, round, and reactive to light.  Cardiovascular:     Rate and Rhythm: Normal rate.  Pulmonary:     Effort: Pulmonary effort is normal.  Abdominal:     General: Abdomen is flat. There is no distension.     Palpations: Abdomen is soft.  Musculoskeletal:        General: Normal range of motion.  Skin:    General: Skin is warm and dry.  Neurological:     General: No focal deficit present.     Mental Status: He is alert.     (all labs ordered are listed, but only abnormal results are displayed) Labs Reviewed - No data to display  EKG: None  Radiology: No results found.   Procedures   Medications Ordered in the ED - No data to display                                  Medical Decision Making 58 year old male who is here today because his caretaker can no longer take care of him.  Plan-on my assessment of the patient, he is overall well-appearing.  He is pleasant, tells me that he lives with his friend Sari.  She owns the home, and he was told he could no longer stay there.  I attempted to call his friend Sari, however nobody picked up the number that I called.  Patient has no complaints at this time aside from back pain.  He has chronic back pain, had CT imaging during his stay at [facility] that showed only moderate canal stenosis.  He has no lower extremity weakness, numbness or tingling.  He has no difficulty with  ambulation.  I have reached out to our [facility] and home health team.  Basic labs ordered.  Reviewed the patient's recent ED notes, as well as social work notes.  Reassessment 6 PM-patient has been medically cleared.  He is now appropriate for social work evaluation and placement.  All home medications ordered.  Amount and/or Complexity of Data Reviewed Labs: ordered.  Risk Prescription drug management.        Final diagnoses:  None    ED Discharge Orders     None          Mannie Fairy DASEN, DO 03/02/24 2157

## 2024-03-02 NOTE — Progress Notes (Signed)
 No SNF options available at this time.  Plan for dc home with [facility] today.  Pt's friend [place] requesting [facility] transport home.   Julien Das, MSW, LCSW [phone] (coverage)

## 2024-03-02 NOTE — Discharge Instructions (Addendum)
 Please return to the ED if you have any concerns or questions  *[facility] will contact you to schedule home health services. A nurse case manager has been assigned to assist with ongoing needs.

## 2024-03-02 NOTE — ED Provider Notes (Signed)
 Emergency Medicine Observation Re-evaluation Note  [name] [name] is a 58 y.o. male, seen on rounds today.  Pt initially presented to the ED for complaints of Altered Mental Status The patient was waiting on a skilled nursing facility placement, however there have been no openings available.  Plan today established by social work is to discharge home with [facility].  Patient was cleared by psych on 10/22.  Physical Exam  BP (!) 100/54 (BP Location: Left Arm)   Pulse 70   Temp (!) 97.4 F (36.3 C) (Oral)   Resp 20   Ht 5' 10 (1.778 m)   Wt 93 kg   SpO2 92%   BMI 29.42 kg/m  Physical Exam General: no distress Cardiac: normal perfusion Lungs: normal RR Psych: denies SI or HI  ED Course / MDM  EKG:   I have reviewed the labs performed to date as well as medications administered while in observation.    Plan  Current plan is for DC home with [facility].    Cowan Pilar, DO 03/02/24 [phone]

## 2024-03-02 NOTE — ED Triage Notes (Signed)
 BIB EMS back pain and mouth sores for weeks. He come in today, caregiver can no longer take care of him. Needs placement. 138/92-79-94% RA- CBG 138

## 2024-03-02 NOTE — Progress Notes (Signed)
 Pt recently at [facility] for SNF workup. No placement found and pt was returned home. Pt brought to [facility] due to friend stating she cannot take care of him anymore. ICM supervisor notified of case.

## 2024-03-03 LAB — CBG MONITORING, ED: Glucose-Capillary: 113 mg/dL — ABNORMAL HIGH (ref 70–99)

## 2024-03-03 MED ORDER — ACETAMINOPHEN 325 MG PO TABS
650.0000 mg | ORAL_TABLET | Freq: Four times a day (QID) | ORAL | Status: DC | PRN
  Administered 2024-03-03 – 2024-03-06 (×2): 650 mg via ORAL
  Filled 2024-03-03 (×2): qty 2

## 2024-03-03 NOTE — ED Provider Notes (Signed)
 Emergency Medicine Observation Re-evaluation Note  [name] [name] is a 58 y.o. male, seen on rounds today.  Pt initially presented to the ED for complaints of Placement Currently, the patient is resting.  Physical Exam  BP (!) 171/144   Pulse 77   Temp 98 F (36.7 C)   SpO2 92%  Physical Exam General: NAD   ED Course / MDM  EKG:   I have reviewed the labs performed to date as well as medications administered while in observation.  Recent changes in the last 24 hours include no acute events reported.  Plan  Current plan is for placement.    [name] Maude BROCKS, MD 03/03/24 [phone]

## 2024-03-03 NOTE — ED Provider Notes (Signed)
 I was asked to come and evaluate the patient after nursing staff was recording low blood pressures.  Upon arrival, patient was sleeping, but woke to voice.  He said he had no complaints at this time.  He had a brisk pulse in his right arm.  It appeared the difficulty with achieving his blood pressure is from the shaking that the patient intermittently has from his Parkinson's disease.  Held the patient's arm still and had a normal blood pressure.  Believe earlier low readings were likely error.   Mannie Pac T, DO 03/03/24 1815

## 2024-03-04 NOTE — ED Provider Notes (Signed)
 Emergency Medicine Observation Re-evaluation Note  [name] [name] is a 58 y.o. male, seen on rounds today.  Pt initially presented to the ED for complaints of Placement Currently, the patient is resting in bed, wants to get up and walk around.  Physical Exam  BP 109/82 (BP Location: Right Arm)   Pulse 92   Temp 97.7 F (36.5 C) (Oral)   Resp 18   SpO2 93%  Physical Exam General: NAD Cardiac: regular rate  Lungs: equal chest rise Psych: calm  ED Course / MDM  EKG:EKG Interpretation Date/Time:  Friday March 02 2024 17:06:05 EDT Ventricular Rate:  80 PR Interval:  170 QRS Duration:  92 QT Interval:  410 QTC Calculation: 472 R Axis:   47  Text Interpretation: Normal sinus rhythm Normal ECG No significant change since last tracing Confirmed by Dean Clarity [phone]) on 03/03/2024 1:58:50 PM  I have reviewed the labs performed to date as well as medications administered while in observation.  Recent changes in the last 24 hours include none.  Plan  Current plan is for placement.    Francesca Elsie CROME, MD 03/04/24 [phone]

## 2024-03-05 NOTE — ED Provider Notes (Signed)
 Emergency Medicine Observation Re-evaluation Note  [name] [name] is a 58 y.o. male, seen on rounds today.  Pt initially presented to the ED for complaints of Placement Currently, the patient is resting.  Physical Exam  BP 110/77 (BP Location: Right Arm)   Pulse 63   Temp 97.7 F (36.5 C) (Oral)   Resp 18   SpO2 97%  Physical Exam General: NAD  ED Course / MDM  EKG:EKG Interpretation Date/Time:  Friday March 02 2024 17:06:05 EDT Ventricular Rate:  80 PR Interval:  170 QRS Duration:  92 QT Interval:  410 QTC Calculation: 472 R Axis:   47  Text Interpretation: Normal sinus rhythm Normal ECG No significant change since last tracing Confirmed by Dean Clarity [phone]) on 03/03/2024 1:58:50 PM  I have reviewed the labs performed to date as well as medications administered while in observation.  Recent changes in the last 24 hours include no acute events reported.  Plan  Current plan is for placement.    [name] Maude BROCKS, MD 03/05/24 [phone]

## 2024-03-05 NOTE — Progress Notes (Signed)
 CSW spoke with pt at bedside. Pt stated he does not have anyone else to stay with. He reported he recently spoke to a lawyer who is assisting with his disability application. CSW inquired about an update and he states the lawyer said to give him a little more time. CSW awaiting guidance from [facility] leadership team.

## 2024-03-06 MED ORDER — DIAZEPAM 5 MG PO TABS
5.0000 mg | ORAL_TABLET | Freq: Once | ORAL | Status: DC
  Filled 2024-03-06: qty 1

## 2024-03-06 NOTE — ED Provider Notes (Signed)
 Emergency Medicine Observation Re-evaluation Note  [name] [name] is a 58 y.o. male, seen on rounds today.  Pt initially presented to the ED for complaints of Placement Currently, the patient is sitting in bed, resting.  Physical Exam  BP (!) 151/81 (BP Location: Right Arm)   Pulse 62   Temp 97.7 F (36.5 C) (Oral)   Resp 18   SpO2 96%  Physical Exam General: nad   ED Course / MDM  EKG:EKG Interpretation Date/Time:  Friday March 02 2024 17:06:05 EDT Ventricular Rate:  80 PR Interval:  170 QRS Duration:  92 QT Interval:  410 QTC Calculation: 472 R Axis:   47  Text Interpretation: Normal sinus rhythm Normal ECG No significant change since last tracing Confirmed by Dean Clarity [phone]) on 03/03/2024 1:58:50 PM  I have reviewed the labs performed to date as well as medications administered while in observation.  Recent changes in the last 24 hours include no acute events overnight.  Plan  Current plan is for placement.    Elnor Jayson LABOR, DO 03/06/24 [phone]

## 2024-03-06 NOTE — ED Notes (Signed)
 Patient has been calm and cooperative the majority of the shift. He did have a moment of agitation and confusion when attempting to put a blanket on as if it were a shirt. He was easily redirected and a shirt was provided. Patient was able to put on the shirt with minimal assistance. Agitation subsided.

## 2024-03-06 NOTE — ED Notes (Signed)
 [facility] hotline called. Call back number left.

## 2024-03-06 NOTE — Progress Notes (Signed)
 CSW received update from [facility] leadership Zack. ICM is unable are unable to assist with getting placement in a facility due to financial and behavior barriers. CSW called [facility] and [facility] has a male bed available. Pt will need to call hotline and wait for a callback to complete the interview [phone].

## 2024-03-07 ENCOUNTER — Emergency Department (HOSPITAL_COMMUNITY)

## 2024-03-07 DIAGNOSIS — R54 Age-related physical debility: Secondary | ICD-10-CM | POA: Diagnosis present

## 2024-03-07 DIAGNOSIS — R531 Weakness: Secondary | ICD-10-CM | POA: Diagnosis not present

## 2024-03-07 DIAGNOSIS — F411 Generalized anxiety disorder: Secondary | ICD-10-CM

## 2024-03-07 DIAGNOSIS — Z7189 Other specified counseling: Secondary | ICD-10-CM | POA: Diagnosis not present

## 2024-03-07 DIAGNOSIS — F332 Major depressive disorder, recurrent severe without psychotic features: Secondary | ICD-10-CM | POA: Diagnosis not present

## 2024-03-07 DIAGNOSIS — G20C Parkinsonism, unspecified: Secondary | ICD-10-CM | POA: Diagnosis not present

## 2024-03-07 DIAGNOSIS — Z66 Do not resuscitate: Secondary | ICD-10-CM | POA: Diagnosis present

## 2024-03-07 DIAGNOSIS — Z1330 Encounter for screening examination for mental health and behavioral disorders, unspecified: Secondary | ICD-10-CM | POA: Diagnosis not present

## 2024-03-07 DIAGNOSIS — R29898 Other symptoms and signs involving the musculoskeletal system: Secondary | ICD-10-CM | POA: Diagnosis not present

## 2024-03-07 DIAGNOSIS — R410 Disorientation, unspecified: Secondary | ICD-10-CM | POA: Diagnosis not present

## 2024-03-07 DIAGNOSIS — E119 Type 2 diabetes mellitus without complications: Secondary | ICD-10-CM | POA: Diagnosis not present

## 2024-03-07 DIAGNOSIS — F3130 Bipolar disorder, current episode depressed, mild or moderate severity, unspecified: Secondary | ICD-10-CM | POA: Diagnosis not present

## 2024-03-07 DIAGNOSIS — I959 Hypotension, unspecified: Secondary | ICD-10-CM | POA: Diagnosis not present

## 2024-03-07 DIAGNOSIS — Z515 Encounter for palliative care: Secondary | ICD-10-CM | POA: Diagnosis not present

## 2024-03-07 DIAGNOSIS — Z5900 Homelessness unspecified: Secondary | ICD-10-CM | POA: Diagnosis not present

## 2024-03-07 DIAGNOSIS — Z1152 Encounter for screening for COVID-19: Secondary | ICD-10-CM | POA: Diagnosis not present

## 2024-03-07 DIAGNOSIS — I2510 Atherosclerotic heart disease of native coronary artery without angina pectoris: Secondary | ICD-10-CM | POA: Diagnosis present

## 2024-03-07 DIAGNOSIS — G9341 Metabolic encephalopathy: Secondary | ICD-10-CM | POA: Diagnosis present

## 2024-03-07 DIAGNOSIS — M545 Low back pain, unspecified: Secondary | ICD-10-CM

## 2024-03-07 DIAGNOSIS — G20A1 Parkinson's disease without dyskinesia, without mention of fluctuations: Secondary | ICD-10-CM | POA: Diagnosis present

## 2024-03-07 DIAGNOSIS — G9340 Encephalopathy, unspecified: Secondary | ICD-10-CM | POA: Diagnosis present

## 2024-03-07 DIAGNOSIS — G4700 Insomnia, unspecified: Secondary | ICD-10-CM | POA: Diagnosis present

## 2024-03-07 DIAGNOSIS — W1830XA Fall on same level, unspecified, initial encounter: Secondary | ICD-10-CM | POA: Diagnosis not present

## 2024-03-07 DIAGNOSIS — G8929 Other chronic pain: Secondary | ICD-10-CM

## 2024-03-07 DIAGNOSIS — Y92230 Patient room in hospital as the place of occurrence of the external cause: Secondary | ICD-10-CM | POA: Diagnosis not present

## 2024-03-07 DIAGNOSIS — R569 Unspecified convulsions: Secondary | ICD-10-CM | POA: Diagnosis not present

## 2024-03-07 DIAGNOSIS — Z751 Person awaiting admission to adequate facility elsewhere: Secondary | ICD-10-CM | POA: Diagnosis not present

## 2024-03-07 DIAGNOSIS — L89151 Pressure ulcer of sacral region, stage 1: Secondary | ICD-10-CM | POA: Diagnosis not present

## 2024-03-07 DIAGNOSIS — E785 Hyperlipidemia, unspecified: Secondary | ICD-10-CM | POA: Diagnosis not present

## 2024-03-07 DIAGNOSIS — R45851 Suicidal ideations: Secondary | ICD-10-CM | POA: Diagnosis present

## 2024-03-07 DIAGNOSIS — R6889 Other general symptoms and signs: Secondary | ICD-10-CM | POA: Diagnosis not present

## 2024-03-07 DIAGNOSIS — I10 Essential (primary) hypertension: Secondary | ICD-10-CM | POA: Diagnosis present

## 2024-03-07 DIAGNOSIS — R296 Repeated falls: Secondary | ICD-10-CM | POA: Diagnosis present

## 2024-03-07 DIAGNOSIS — Z781 Physical restraint status: Secondary | ICD-10-CM | POA: Diagnosis not present

## 2024-03-07 DIAGNOSIS — E440 Moderate protein-calorie malnutrition: Secondary | ICD-10-CM | POA: Diagnosis present

## 2024-03-07 DIAGNOSIS — E11649 Type 2 diabetes mellitus with hypoglycemia without coma: Secondary | ICD-10-CM | POA: Diagnosis not present

## 2024-03-07 DIAGNOSIS — E782 Mixed hyperlipidemia: Secondary | ICD-10-CM | POA: Diagnosis present

## 2024-03-07 DIAGNOSIS — F319 Bipolar disorder, unspecified: Secondary | ICD-10-CM | POA: Diagnosis present

## 2024-03-07 DIAGNOSIS — F329 Major depressive disorder, single episode, unspecified: Secondary | ICD-10-CM | POA: Diagnosis not present

## 2024-03-07 DIAGNOSIS — K219 Gastro-esophageal reflux disease without esophagitis: Secondary | ICD-10-CM | POA: Diagnosis present

## 2024-03-07 DIAGNOSIS — G2401 Drug induced subacute dyskinesia: Secondary | ICD-10-CM | POA: Diagnosis not present

## 2024-03-07 DIAGNOSIS — E1165 Type 2 diabetes mellitus with hyperglycemia: Secondary | ICD-10-CM | POA: Diagnosis present

## 2024-03-07 DIAGNOSIS — F419 Anxiety disorder, unspecified: Secondary | ICD-10-CM | POA: Diagnosis not present

## 2024-03-07 DIAGNOSIS — E860 Dehydration: Secondary | ICD-10-CM | POA: Diagnosis present

## 2024-03-07 DIAGNOSIS — R451 Restlessness and agitation: Secondary | ICD-10-CM | POA: Diagnosis not present

## 2024-03-07 DIAGNOSIS — F54 Psychological and behavioral factors associated with disorders or diseases classified elsewhere: Secondary | ICD-10-CM | POA: Diagnosis present

## 2024-03-07 LAB — COMPREHENSIVE METABOLIC PANEL WITH GFR
ALT: 23 U/L (ref 0–44)
AST: 29 U/L (ref 15–41)
Albumin: 4.6 g/dL (ref 3.5–5.0)
Alkaline Phosphatase: 100 U/L (ref 38–126)
Anion gap: 12 (ref 5–15)
BUN: 21 mg/dL — ABNORMAL HIGH (ref 6–20)
CO2: 28 mmol/L (ref 22–32)
Calcium: 10.2 mg/dL (ref 8.9–10.3)
Chloride: 97 mmol/L — ABNORMAL LOW (ref 98–111)
Creatinine, Ser: 1.12 mg/dL (ref 0.61–1.24)
GFR, Estimated: >60 mL/min (ref >60)
Glucose, Bld: 99 mg/dL (ref 70–99)
Potassium: 4.5 mmol/L (ref 3.5–5.1)
Sodium: 137 mmol/L (ref 135–145)
Total Bilirubin: 0.8 mg/dL (ref 0.0–1.2)
Total Protein: 8.2 g/dL — ABNORMAL HIGH (ref 6.5–8.1)

## 2024-03-07 LAB — CBC WITH DIFFERENTIAL/PLATELET
Abs Immature Granulocytes: 0.02 K/uL (ref 0.00–0.07)
Basophils Absolute: 0.1 K/uL (ref 0.0–0.1)
Basophils Relative: 1 %
Eosinophils Absolute: 0.1 K/uL (ref 0.0–0.5)
Eosinophils Relative: 2 %
HCT: 53.4 % — ABNORMAL HIGH (ref 39.0–52.0)
Hemoglobin: 17.7 g/dL — ABNORMAL HIGH (ref 13.0–17.0)
Immature Granulocytes: 0 %
Lymphocytes Relative: 34 %
Lymphs Abs: 2.7 K/uL (ref 0.7–4.0)
MCH: 28.5 pg (ref 26.0–34.0)
MCHC: 33.1 g/dL (ref 30.0–36.0)
MCV: 85.9 fL (ref 80.0–100.0)
Monocytes Absolute: 0.8 K/uL (ref 0.1–1.0)
Monocytes Relative: 10 %
Neutro Abs: 4.2 K/uL (ref 1.7–7.7)
Neutrophils Relative %: 53 %
Platelets: 316 K/uL (ref 150–400)
RBC: 6.22 MIL/uL — ABNORMAL HIGH (ref 4.22–5.81)
RDW: 13.7 % (ref 11.5–15.5)
WBC: 7.9 K/uL (ref 4.0–10.5)
nRBC: 0 % (ref 0.0–0.2)

## 2024-03-07 LAB — URINALYSIS, W/ REFLEX TO CULTURE (INFECTION SUSPECTED)
Bilirubin Urine: NEGATIVE
Glucose, UA: NEGATIVE mg/dL
Hgb urine dipstick: NEGATIVE
Ketones, ur: 5 mg/dL — AB
Nitrite: NEGATIVE
Protein, ur: NEGATIVE mg/dL
Specific Gravity, Urine: 1.024 (ref 1.005–1.030)
pH: 6 (ref 5.0–8.0)

## 2024-03-07 LAB — AMMONIA: Ammonia: <13 umol/L (ref 9–35)

## 2024-03-07 NOTE — ED Provider Notes (Signed)
 Patient signed out to me by previous provider. Please refer to their note for full HPI.  Briefly this is a 58 year old male who was seen on rounds today by previous provider, pending evaluation for placement.  However patient had a change in mental status.   Patient signed out pending further metabolic workup and CT of the head.   ED Course: Resulting labs show no acute finding.  CT head at baseline.  UA is still pending.  However on reevaluation patient continues to display findings of acute delirium.  He is oriented to self, but inattentive, restless, at times confused.  We have been working with social work.  Since the patient is no longer in baseline mental status we recommend medical admission for further care.  Patients evaluation and results requires admission for further treatment and care.  Spoke with hospitalist, reviewed patient's ED course and they accept admission.  Patient agrees with admission plan, offers no new complaints and is stable/unchanged at time of admit.   Bari Roxie HERO, DO 03/07/24 1754

## 2024-03-07 NOTE — Progress Notes (Addendum)
 Paramedic expressed concerns for discharge. CSW relayed concerns to hospital leadership team via secure chat. Medical director requested EDP assess pt. Per EDP, pt does seem altered and will continue medical workup for now. ICM will follow.   Addend @ 8:24PM Pt has been admitted for AMS. ICM Director and this clinical research associate notified [employer] and congregational nurse, Aloysius Ford, via email who canceled the reservation at [facility]. [facility] can be contacted via secure chat or by phone at [phone] to assist with hotel coordination at discharge.

## 2024-03-07 NOTE — ED Notes (Addendum)
 Pt noted to have some mild confusion, and he did not remember the plan, although the LCSW had spoken with him about it. TCU staff stated she had to help him eat his lunch and his mobility was impaired, as it is at baseline (baseline use of a walker).  I personally do not feel comfortable sending him out into the community alone with no direct supervision. LCSW made aware and she stated she would reach out to her leaders.

## 2024-03-07 NOTE — ED Notes (Signed)
 Pt sitting up in the chair next to his bed eating lunch. No other needs identified at this time. Will continue to monitor.

## 2024-03-07 NOTE — ED Notes (Signed)
Clean catch urine specimen obtained and sent to lab.

## 2024-03-07 NOTE — ED Notes (Signed)
 Report called bed is a ready bed and necessary staff is available.

## 2024-03-07 NOTE — Progress Notes (Addendum)
 ICM is collaborating with Congregational Nurse Program to secure a hotel for patient. CSW was provided the [employer] agreement by Aloysius Ford, RN, to retrieve pt's signature. Pt signed form and it was sent back to [facility].  CSW awaiting update on hotel- will provide transportation.  Addend @ 12:57PM Per Raven, they will provide food and water. She is requesting HH come out. CSW outreached to [facility] to inquire about service while in hotel. Raven reported typical check in time to hotel is 3pm.   Addend @ 2:22PM Pt will [facility] to [facility]- [address] [place], [place], [place] [address]. Per Raven, dates are 10/29-11/5. Raven reported she will take the pt food and a walker this evening.

## 2024-03-07 NOTE — ED Notes (Signed)
 Pt ambulated with a walker around his room and into the hallway a little bit without further assistance, complaint, or deficit. Previously and without the walker, he was leaning back and needed one on one support to ambulate. SW contacted and made aware for placement.

## 2024-03-07 NOTE — Plan of Care (Signed)
 ?  Problem: Coping: ?Goal: Level of anxiety will decrease ?Outcome: Progressing ?  ?Problem: Safety: ?Goal: Ability to remain free from injury will improve ?Outcome: Progressing ?  ?

## 2024-03-07 NOTE — H&P (Incomplete)
 History and Physical  [name] [name] FMW:[id number] DOB: Mar 01, 1966 DOA: 03/02/2024  PCP: [name], [name]   Chief Complaint: Altered mental status  HPI: [name] [name] is a 58 y.o. male with medical history significant for bipolar 1 disorder, tardive dyskinesia, PTSD, MDD, GAD, T2DM, HLD, GERD osteoarthritis and tremors who was initially presented to the ED on 10/12 for evaluation of fall and chronic back pain.  Per chart review, patient was previously living with a friend for the last 2 to 3 years.  Due to patient' recent bizarre behaviors and concern for her own safety, patient's friend does not wish for patient to return to her home. TOC has been working diligently on placement for patient but has been unsuccessful.  On 10/19, psychiatry was consulted for evaluation due to patient endorsing suicidal thoughts with a plan to shoot himself and access to lethal means at home.  Patient was initially recommended for inpatient psych admission however patient was ultimately cleared by psychiatry on 10/22.  Patient has remained in the ED since then with multiple unsuccessful attempt at placement due to financial and behavioral barriers.  Per CSW, patient currently not deemed disabled and currently under the age of 37 so Medicaid cannot be converted to LTC. On day of admission, there was initial plan to discharge patient to an extended stay hotel however on reevaluation, patient was found to be altered during the discharge process.  On evaluation by EDP, patient had acute change in his baseline mental status with concern for acute delirium.  Vitals are overall stable, labs unremarkable with normal CMP, CBC, ammonia, UA with no signs of infection. CT head with no acute intracranial abnormality. TRH was consulted for admission for acute encephalopathy.  On my evaluation, patient laying in bed comfortably being fed by tech.  He is quite inattentive with flat affect but able to answer a few questions.  He is  oriented to self and able to tell me that he is in [place]  but unable to tell me if he is in the hospital or the correct date. He continues to have intermittent tremors/jerks during evaluation.  Review of Systems: Please see HPI for pertinent positives and negatives. A complete 10 system review of systems are otherwise negative.  Past Medical History:  Diagnosis Date   Anxiety    Arthritis    Chest heaviness    Chills    Depression, major    Diabetes mellitus without complication (HCC)    Dizzy    Dyslipidemia    ED (erectile dysfunction)    Elevated CPK    GERD (gastroesophageal reflux disease)    HA (headache)    Hypertension    Hypertriglyceridemia    Insomnia    Kidney stone    Left-sided Bell's palsy    Morbid obesity (HCC)    Nausea    No appetite    Panniculitis    of right lower leg   Parkinson's disease (HCC)    PTSD (post-traumatic stress disorder)    Routine screening for STI (sexually transmitted infection) 01/05/2023   Weakness    Past Surgical History:  Procedure Laterality Date   NO PAST SURGERIES     Social History:  reports that he has never smoked. He has never used smokeless tobacco. He reports that he does not drink alcohol and does not use drugs.  No Known Allergies  Family History  Problem Relation Age of Onset   Thyroid  disease Brother    Depression Brother  Heart disease Mother    Anxiety disorder Mother    Kidney failure Father    Diabetes Father    Anxiety disorder Father    Thyroid  disease Sister    Mental illness Sister    Heart attack Maternal Grandfather    Heart failure Paternal Grandmother    Cancer Paternal Grandfather        bone     Prior to Admission medications   Medication Sig Start Date End Date Taking? Authorizing Provider  atorvastatin  (LIPITOR) 20 MG tablet TAKE 1 TABLET BY MOUTH DAILY 11/28/23  Yes Kayla, [facility] S, [name]  benztropine  (COGENTIN ) 1 MG tablet Take 1 tablet (1 mg total) by mouth 2  (two) times daily. 01/26/24  Yes Harl Regan E, NP  divalproex  (DEPAKOTE ) 500 MG DR tablet Take 1 tablet (500 mg total) by mouth 2 (two) times daily. 01/26/24  Yes Harl Regan E, NP  DULoxetine  HCl 40 MG CPEP Take 40 mg by mouth 2 (two) times daily.   Yes [provider]  glimepiride  (AMARYL ) 4 MG tablet TAKE 1 TABLET (4 MG TOTAL) BY MOUTH 2 (TWO) TIMES DAILY. 08/22/23  Yes Howard, Amber S, [name]  hydrOXYzine  (ATARAX ) 50 MG tablet TAKE 1 TABLET BY MOUTH FOUR TIMES A DAY & TAKE 2 TABLETS EVERY NIGHT AT BEDTIME FOR ANXIETY, PANIC, AGITATION, AND SLEEP Patient taking differently: Take 50-100 mg by mouth See admin instructions. Take 50 mg by mouth four times a day and 100 mg at bedtime 01/26/24  Yes Harl Regan E, NP  lisinopril  (ZESTRIL ) 20 MG tablet TAKE 1 TABLET BY MOUTH DAILY 02/15/24  Yes Kayla, [facility] S, [name]  metFORMIN  (GLUCOPHAGE ) 500 MG tablet TAKE 1 TABLET (500 MG TOTAL) BY MOUTH 2 (TWO) TIMES DAILY. 08/22/23  Yes Kayla Gauze S, [name]  propranolol  (INDERAL ) 10 MG tablet Take 1 tablet (10 mg total) by mouth 3 (three) times daily. 01/26/24  Yes Harl Regan E, NP  valbenazine  (INGREZZA ) 80 MG capsule Take 1 capsule (80 mg total) by mouth daily. 01/26/24  Yes Harl Regan E, NP  sildenafil  (REVATIO ) 20 MG tablet Take 1 tablet (20 mg total) by mouth daily as needed (as directed for E.D.). Patient not taking: Reported on 02/26/2024 02/09/23   Duanne Butler DASEN, MD    Physical Exam: BP (!) 150/110 (BP Location: Left Arm) Comment: pt had tremors  Pulse 73   Temp 98.2 F (36.8 C) (Axillary)   Resp 18   Ht 5' 10 (1.778 m)   Wt 87.4 kg   SpO2 91%   BMI 27.65 kg/m  General: Confused middle-age man laying in bed. No acute distress. HEENT: [place]/AT. Anicteric sclera CV: RRR. No murmurs, rubs, or gallops. No LE edema Pulmonary: Lungs CTAB. Normal effort. No wheezing or rales. Abdominal: Soft, nontender, nondistended. Normal bowel sounds. Extremities: Palpable radial and  DP pulses. Normal ROM. Skin: Warm and dry. No obvious rash or lesions. Neuro: Alert and oriented to self only. Delayed thought process. Able to follow commands.  Intermittent extremity tremors. Moves all extremities. Normal sensation to light touch Psych: Flat affect          Labs on Admission:  Basic Metabolic Panel: Recent Labs  Lab 03/02/24 1733 03/07/24 1535  NA 135 137  K 4.5 4.5  CL 97* 97*  CO2 28 28  GLUCOSE 98 99  BUN 18 21*  CREATININE 1.13 1.12  CALCIUM  10.0 10.2   Liver Function Tests: Recent Labs  Lab 03/07/24 1535  AST 29  ALT  23  ALKPHOS 100  BILITOT 0.8  PROT 8.2*  ALBUMIN 4.6   No results for input(s): LIPASE, AMYLASE in the last 168 hours. Recent Labs  Lab 03/07/24 1535  AMMONIA <13   CBC: Recent Labs  Lab 03/02/24 1733 03/07/24 1535  WBC 8.7 7.9  NEUTROABS 5.0 4.2  HGB 17.1* 17.7*  HCT 51.9 53.4*  MCV 85.8 85.9  PLT 313 316   Cardiac Enzymes: No results for input(s): CKTOTAL, CKMB, CKMBINDEX, TROPONINI in the last 168 hours. BNP (last 3 results) No results for input(s): BNP in the last 8760 hours.  ProBNP (last 3 results) No results for input(s): PROBNP in the last 8760 hours.  CBG: Recent Labs  Lab 03/03/24 0906  GLUCAP 113*    Radiological Exams on Admission: CT Head Wo Contrast Result Date: 03/07/2024 CLINICAL DATA:  Altered mental status. EXAM: CT HEAD WITHOUT CONTRAST TECHNIQUE: Contiguous axial images were obtained from the base of the skull through the vertex without intravenous contrast. RADIATION DOSE REDUCTION: This exam was performed according to the departmental dose-optimization program which includes automated exposure control, adjustment of the mA and/or kV according to patient size and/or use of iterative reconstruction technique. COMPARISON:  February 25, 2024 FINDINGS: Brain: There is generalized cerebral atrophy with widening of the extra-axial spaces and ventricular dilatation. There are areas  of decreased attenuation within the white matter tracts of the supratentorial brain, consistent with microvascular disease changes. Vascular: No hyperdense vessel or unexpected calcification. Skull: Normal. Negative for fracture or focal lesion. Sinuses/Orbits: No acute finding. Other: None. IMPRESSION: 1. Generalized cerebral atrophy and microvascular disease changes of the supratentorial brain. 2. No acute intracranial abnormality. Electronically Signed   By: Suzen Dials M.D.   On: 03/07/2024 16:20   Assessment/Plan EULICE RUTLEDGE is a 58 y.o. male with medical history significant for bipolar 1 disorder, tardive dyskinesia, PTSD, MDD, GAD, T2DM, HLD, GERD, osteoarthritis and tremors who was initially presented to the ED on 10/12 for evaluation of fall and chronic back unable to discharge to SNF and currently being admitted for acute metabolic encephalopathy.  # Acute encephalopathy - Patient with history of multiple psychiatric disorders who has been boarding in the ED for more than 2 weeks now found to have acute change in mental status - On my evaluation, patient inattentive, oriented to self and partially to place - Labs overall unremarkable, no signs of infection, electrolyte or metabolic derangements - No structural abnormalities on CT head - Mental status change likely due to acute delirium, worsening psychiatric illness also on the differential - Follow-up MRI brain - Continue safety observation - Delirium precautions  # Bipolar 1 disorder # Major depressive disorder # Generalized anxiety disorder - Patient cleared by psychiatry on 10/22 - Continue benztropine , duloxetine , Depakote , and hydroxyzine  - Consider reconsulting psychiatry for medication changes if no improvement in mental status  # Generalized weakness # Hx of fall # Chronic back pain - Patient has been followed by PT, who recommended inpatient rehab followed by long-term care at ALF on the last evaluation on  10/22 - PT/OT consulted for reevaluation - As needed analgesics - Fall precautions  # Dyskinesis # Tremors - Continue valbenazine  and propranolol  (can adjust as needed)  # T2DM - CBGs have been stable in the 90s to 120s over the last 2 weeks - Continue metformin   # HTN - BP slightly elevated with SBP in the 130s to 150s - Continue lisinopril   # HLD -  Continue atorvastatin   DVT prophylaxis:  Lovenox      Code Status: Full Code  Consults called: None  Family Communication: No family at bedside  Severity of Illness: The appropriate patient status for this patient is INPATIENT. Inpatient status is judged to be reasonable and necessary in order to provide the required intensity of service to ensure the patient's safety. The patient's presenting symptoms, physical exam findings, and initial radiographic and laboratory data in the context of their chronic comorbidities is felt to place them at high risk for further clinical deterioration. Furthermore, it is not anticipated that the patient will be medically stable for discharge from the hospital within 2 midnights of admission.   * I certify that at the point of admission it is my clinical judgment that the patient will require inpatient hospital care spanning beyond 2 midnights from the point of admission due to high intensity of service, high risk for further deterioration and high frequency of surveillance required.*  Level of care: Med-Surg    Lou Claretta HERO, MD 03/08/2024, 3:08 AM [facility] Pager: [phone] Isaiah 41:10   If 7PM-7AM, please contact night-coverage [email or web address] Password [email or web address]

## 2024-03-07 NOTE — ED Notes (Signed)
 Pt resting comfortably. Equal chest rise and fall noted. No immediate needs identified at this time. Sitter at bedside and will continue to monitor.

## 2024-03-07 NOTE — ED Provider Notes (Addendum)
  [name] EMERGENCY DEPARTMENT AT [facility] Emergency Medicine Observation Re-evaluation Note  [name] [name] is a 58 y.o. male, seen on rounds today.  Pt initially presented on 03/02/24 at 1440 to the ED for complaints of  Chief Complaint  Patient presents with   Placement   PMHx: Tardive dyskinesia, depression, PTSD, anxiety, bipolar, T2DM, HTN, HLD, tremor, HSV Currently, the patient is resting calmly in his bed.  Physical Exam  BP (!) 144/85 (BP Location: Right Arm)   Pulse 70   Temp 98.1 F (36.7 C) (Oral)   Resp 18   SpO2 94%  Physical Exam General: NAD Lungs: Normal effort Psych: Currently calm  ED Course / MDM  EKG:EKG Interpretation Date/Time:  Friday March 02 2024 17:06:05 EDT Ventricular Rate:  80 PR Interval:  170 QRS Duration:  92 QT Interval:  410 QTC Calculation: 472 R Axis:   47  Text Interpretation: Normal sinus rhythm Normal ECG No significant change since last tracing Confirmed by Dean Clarity [phone]) on 03/03/2024 1:58:50 PM  I have reviewed the labs performed to date as well as medications administered while in observation.  Recent changes in the last 24 hours include no acute events, social work investigating placement options for patient such as [facility] [employer] and [employer] . Home medications: Ordered by prior team Diet: Ordered by prior team  Plan  Current plan is for TOC placement.  I was notified by staff that patient had change in mental status, previously was alert, oriented, and now was believing that he was in a hotel room.  I reevaluated the patient, oriented only to self, appears inattentive on exam, though not definitively reacting to internal stimuli.  This change is potentially consistent with development of delirium.  Given change in clinical status, do feel that patient warrants labs, will obtain CT head, UA, ammonia, as well as repeat CBC and CMP given most recent prior labs were from 10/24.  Care handoff at the  time of signout with plan to follow-up labs, and if patient continues to be altered, consider admission.   [name] Jerilynn [name], MD 03/07/24 9148    [name] Jerilynn [name], MD 03/07/24 [phone]

## 2024-03-07 NOTE — ED Notes (Signed)
 Pt license printed out and a copy was given to him and one was placed in his bag with his cell phone and charger. He is to take this to the hotel to check in, per Jada, LCSW.

## 2024-03-08 ENCOUNTER — Inpatient Hospital Stay (HOSPITAL_COMMUNITY)

## 2024-03-08 DIAGNOSIS — R410 Disorientation, unspecified: Secondary | ICD-10-CM | POA: Diagnosis present

## 2024-03-08 DIAGNOSIS — R531 Weakness: Secondary | ICD-10-CM | POA: Insufficient documentation

## 2024-03-08 DIAGNOSIS — G9340 Encephalopathy, unspecified: Secondary | ICD-10-CM | POA: Diagnosis not present

## 2024-03-08 DIAGNOSIS — F319 Bipolar disorder, unspecified: Secondary | ICD-10-CM | POA: Diagnosis present

## 2024-03-08 LAB — BLOOD GAS, ARTERIAL
Acid-Base Excess: 2.8 mmol/L — ABNORMAL HIGH (ref 0.0–2.0)
Bicarbonate: 26.1 mmol/L (ref 20.0–28.0)
Drawn by: 27407
FIO2: 21 %
O2 Saturation: 95.5 %
Patient temperature: 36.7
pCO2 arterial: 34 mmHg (ref 32–48)
pH, Arterial: 7.49 — ABNORMAL HIGH (ref 7.35–7.45)
pO2, Arterial: 65 mmHg — ABNORMAL LOW (ref 83–108)

## 2024-03-08 LAB — CBC
HCT: 55.1 % — ABNORMAL HIGH (ref 39.0–52.0)
Hemoglobin: 17.7 g/dL — ABNORMAL HIGH (ref 13.0–17.0)
MCH: 28 pg (ref 26.0–34.0)
MCHC: 32.1 g/dL (ref 30.0–36.0)
MCV: 87 fL (ref 80.0–100.0)
Platelets: 333 K/uL (ref 150–400)
RBC: 6.33 MIL/uL — ABNORMAL HIGH (ref 4.22–5.81)
RDW: 14 % (ref 11.5–15.5)
WBC: 8.5 K/uL (ref 4.0–10.5)
nRBC: 0 % (ref 0.0–0.2)

## 2024-03-08 LAB — VITAMIN B12: Vitamin B-12: 573 pg/mL (ref 180–914)

## 2024-03-08 LAB — TSH: TSH: 2.64 u[IU]/mL (ref 0.350–4.500)

## 2024-03-08 LAB — BASIC METABOLIC PANEL WITH GFR
Anion gap: 17 — ABNORMAL HIGH (ref 5–15)
BUN: 22 mg/dL — ABNORMAL HIGH (ref 6–20)
CO2: 22 mmol/L (ref 22–32)
Calcium: 10.3 mg/dL (ref 8.9–10.3)
Chloride: 98 mmol/L (ref 98–111)
Creatinine, Ser: 1.13 mg/dL (ref 0.61–1.24)
GFR, Estimated: >60 mL/min (ref >60)
Glucose, Bld: 60 mg/dL — ABNORMAL LOW (ref 70–99)
Potassium: 4.6 mmol/L (ref 3.5–5.1)
Sodium: 136 mmol/L (ref 135–145)

## 2024-03-08 LAB — GLUCOSE, CAPILLARY
Glucose-Capillary: 95 mg/dL (ref 70–99)
Glucose-Capillary: 179 mg/dL — ABNORMAL HIGH (ref 70–99)
Glucose-Capillary: 104 mg/dL — ABNORMAL HIGH (ref 70–99)
Glucose-Capillary: 93 mg/dL (ref 70–99)

## 2024-03-08 LAB — VALPROIC ACID LEVEL: Valproic Acid Lvl: 73 ug/mL (ref 50–100)

## 2024-03-08 LAB — HIV ANTIBODY (ROUTINE TESTING W REFLEX): HIV Screen 4th Generation wRfx: NONREACTIVE

## 2024-03-08 MED ORDER — HYDROXYZINE HCL 25 MG PO TABS
50.0000 mg | ORAL_TABLET | Freq: Every day | ORAL | Status: DC
  Administered 2024-03-08 – 2024-06-07 (×91): 50 mg via ORAL
  Filled 2024-03-08 (×85): qty 2

## 2024-03-08 MED ORDER — VITAMIN D (ERGOCALCIFEROL) 1.25 MG (50000 UNIT) PO CAPS
50000.0000 [IU] | ORAL_CAPSULE | ORAL | Status: AC
  Administered 2024-03-08 – 2024-03-29 (×4): 50000 [IU] via ORAL
  Filled 2024-03-08 (×4): qty 1

## 2024-03-08 MED ORDER — ACETAMINOPHEN 650 MG RE SUPP: 650.0000 mg | Freq: Four times a day (QID) | RECTAL | Status: DC | PRN

## 2024-03-08 MED ORDER — ACETAMINOPHEN 325 MG PO TABS
650.0000 mg | ORAL_TABLET | Freq: Four times a day (QID) | ORAL | Status: DC | PRN
  Administered 2024-03-11 – 2024-05-23 (×12): 650 mg via ORAL
  Filled 2024-03-08 (×11): qty 2

## 2024-03-08 MED ORDER — LACTATED RINGERS IV SOLN: INTRAVENOUS | Status: DC

## 2024-03-08 MED ORDER — LACTATED RINGERS IV BOLUS
500.0000 mL | Freq: Once | INTRAVENOUS | Status: AC
  Administered 2024-03-08: 500 mL via INTRAVENOUS

## 2024-03-08 MED ORDER — ENOXAPARIN SODIUM 40 MG/0.4ML IJ SOSY
40.0000 mg | PREFILLED_SYRINGE | INTRAMUSCULAR | Status: DC
  Administered 2024-03-08 – 2024-06-08 (×93): 40 mg via SUBCUTANEOUS
  Filled 2024-03-08 (×87): qty 0.4

## 2024-03-08 MED ORDER — ONDANSETRON HCL 4 MG/2ML IJ SOLN: 4.0000 mg | Freq: Four times a day (QID) | INTRAMUSCULAR | Status: DC | PRN

## 2024-03-08 MED ORDER — BISACODYL 5 MG PO TBEC
5.0000 mg | DELAYED_RELEASE_TABLET | Freq: Every day | ORAL | Status: DC | PRN
  Administered 2024-03-20 – 2024-04-15 (×3): 5 mg via ORAL
  Filled 2024-03-08 (×4): qty 1

## 2024-03-08 MED ORDER — HALOPERIDOL LACTATE 5 MG/ML IJ SOLN
1.0000 mg | Freq: Once | INTRAMUSCULAR | Status: AC
  Administered 2024-03-08: 1 mg via INTRAVENOUS
  Filled 2024-03-08: qty 1

## 2024-03-08 MED ORDER — ONDANSETRON HCL 4 MG PO TABS
4.0000 mg | ORAL_TABLET | Freq: Four times a day (QID) | ORAL | Status: DC | PRN
  Administered 2024-04-13 – 2024-05-16 (×2): 4 mg via ORAL
  Filled 2024-03-08 (×2): qty 1

## 2024-03-08 MED ORDER — SENNOSIDES-DOCUSATE SODIUM 8.6-50 MG PO TABS: 1.0000 | ORAL_TABLET | Freq: Every evening | ORAL | Status: DC | PRN

## 2024-03-08 MED ORDER — INSULIN ASPART 100 UNIT/ML IJ SOLN
0.0000 [IU] | Freq: Three times a day (TID) | INTRAMUSCULAR | Status: DC
  Administered 2024-03-10 – 2024-03-11 (×2): 1 [IU] via SUBCUTANEOUS

## 2024-03-08 NOTE — Progress Notes (Addendum)
 NP Lavanda Horns was secured chatted because patient is restless, he's tried to pull his IV out and pulled his O2 off and we've tried to place it back on but patient is refusing. Safety sitter is leaving at 2300 and there is no replacement per [facility].

## 2024-03-08 NOTE — Progress Notes (Signed)
 Safety sitter [place], NT at patient's bedside.

## 2024-03-08 NOTE — Plan of Care (Signed)
  Problem: Education: Goal: Knowledge of General Education information will improve Description: Including pain rating scale, medication(s)/side effects and non-pharmacologic comfort measures Outcome: Not Applicable

## 2024-03-08 NOTE — Progress Notes (Addendum)
 PROGRESS NOTE    [name] [name]  FMW:[id number] DOB: October 14, 1965 DOA: 03/02/2024 PCP: [name], [name]   Brief Narrative: 58 year old with past medical history significant for bipolar disorder type I, tardive dyskinesia, PTSD, MDD, GAD, diabetes type 2, hyperlipidemia, GERD, osteoarthritis, tremors who initially presented to the ED on 10/12 for evaluation of fall and chronic back pain.  Patient was previously living with a friend for the last 2 or 3 years, due to patient's recent bizarre behavior and concern for her own safety patient's friend does not wish for patient to return to her home.  TOC has been working on placement for patient but has been unsuccessful.  On 10/19 psychiatry was consulted for evaluation due to patient's endorsing suicidal thoughts, with a plan to hurt himself.  Patient was initially recommended for inpatient psychiatric admission however patient was ultimately cleared by psych on 10/22.  Patient has remained in the ED since then with multiple unsuccessful attempts at placement due to financial and behavioral barriers.  On the day of admission there was initial plan to discharge patient to extended stay hotel however on reevaluation, patient was found to be altered during the discharge process.  Per ED physician patient had acute change in his baseline mental status with concern for acute delirium.  Evaluation for delirium labs unremarkable no signs of infection CT head with no acute intracranial abnormality.  Patient has being admitted for acute encephalopathy.   Assessment & Plan:   Principal Problem:   Acute encephalopathy Active Problems:   Chronic low back pain without sciatica   Acute delirium   Generalized weakness   Bipolar 1 disorder (HCC)   1-Acute Metabolic Encephalopathy: - Patient with history of multiple psychiatric disorder, he was boarding in the ED for 2 weeks now had some acute changes in mental status. - CT head: No acute intracranial  abnormality. - No significant electrolyte abnormalities.  Ammonia less than 13, UA negative -Follow MRI, check TSH, B 12, check ABG, chest x ray.  Still sleepy, will reduce atarax  night dose to half.    Bipolar 1 disorder Major depressive disorder GAD -Patient was cleared by psych initially while he was in the ED 10/22 - Continue benztropine , duloxetine , Depakote  and hydroxyzine  Psych consulted to assist adjusting medications.   Generalized weakness History of fall Chronic back pain -PT OT consulted  Dyskinesis Tremors -Continue with propranolol  and valbenazine   Diabetes type 2: With hypoglycemia Hold glipizide Hold Metformin .  Check CBG  Hypertension: Continue with lisinopril  Monitor renal function  Hyperlipidemia: Continue with statins.   Vitamin D  deficiency: resume vitamin d    Hemoconcentration: Start IV fluids.   Estimated body mass index is 27.65 kg/m as calculated from the following:   Height as of this encounter: 5' 10 (1.778 m).   Weight as of this encounter: 87.4 kg.   DVT prophylaxis: Lovenox  Code Status: Full code Family Communication: Disposition Plan:  Status is: Inpatient Remains inpatient appropriate because: management of encephalopathy     Consultants:  Psych   Procedures:  None  Antimicrobials:    Subjective: He is sleepy, wake up to voice, able to tell me he is at [facility] long. Goes back to sleep. Per sitter he ate breakfast, went to bathroom with assistance   Objective: Vitals:   03/07/24 2229 03/07/24 2233 03/08/24 0254 03/08/24 0601  BP:  (!) 130/98 (!) 150/110 135/79  Pulse:  70 73 73  Resp:  17 18 19   Temp:  98 F (36.7 C) 98.2 F (  36.8 C) 98.7 F (37.1 C)  TempSrc:  Oral Axillary   SpO2:  91% 91% (!) 87%  Weight: 87.4 kg     Height: 5' 10 (1.778 m)       Intake/Output Summary (Last 24 hours) at 03/08/2024 0713 Last data filed at 03/07/2024 2300 Gross per 24 hour  Intake 0 ml  Output 400 ml  Net -400 ml    Filed Weights   03/07/24 2229  Weight: 87.4 kg    Examination:  General exam: Appears calm and comfortable  Respiratory system: Clear to auscultation. Respiratory effort normal. Cardiovascular system: S1 & S2 heard, RRR. No JVD, murmurs, rubs, gallops or clicks. No pedal edema. Gastrointestinal system: Abdomen is nondistended, soft and nontender. No organomegaly or masses felt. Normal bowel sounds heard. Central nervous system: Alert and oriented. Follows some commands, trying to reach things in the air. Tremors.  Extremities: Symmetric 5 x 5 power.   Data Reviewed: I have personally reviewed following labs and imaging studies  CBC: Recent Labs  Lab 03/02/24 1733 03/07/24 1535 03/08/24 0508  WBC 8.7 7.9 8.5  NEUTROABS 5.0 4.2  --   HGB 17.1* 17.7* 17.7*  HCT 51.9 53.4* 55.1*  MCV 85.8 85.9 87.0  PLT 313 316 333   Basic Metabolic Panel: Recent Labs  Lab 03/02/24 1733 03/07/24 1535 03/08/24 0508  NA 135 137 136  K 4.5 4.5 4.6  CL 97* 97* 98  CO2 28 28 22   GLUCOSE 98 99 60*  BUN 18 21* 22*  CREATININE 1.13 1.12 1.13  CALCIUM  10.0 10.2 10.3   GFR: Estimated Creatinine Clearance: 72.7 mL/min (by C-G formula based on SCr of 1.13 mg/dL). Liver Function Tests: Recent Labs  Lab 03/07/24 1535  AST 29  ALT 23  ALKPHOS 100  BILITOT 0.8  PROT 8.2*  ALBUMIN 4.6   No results for input(s): LIPASE, AMYLASE in the last 168 hours. Recent Labs  Lab 03/07/24 1535  AMMONIA <13   Coagulation Profile: No results for input(s): INR, PROTIME in the last 168 hours. Cardiac Enzymes: No results for input(s): CKTOTAL, CKMB, CKMBINDEX, TROPONINI in the last 168 hours. BNP (last 3 results) No results for input(s): PROBNP in the last 8760 hours. HbA1C: No results for input(s): HGBA1C in the last 72 hours. CBG: Recent Labs  Lab 03/03/24 0906  GLUCAP 113*   Lipid Profile: No results for input(s): CHOL, HDL, LDLCALC, TRIG, CHOLHDL,  LDLDIRECT in the last 72 hours. Thyroid  Function Tests: No results for input(s): TSH, T4TOTAL, FREET4, T3FREE, THYROIDAB in the last 72 hours. Anemia Panel: No results for input(s): VITAMINB12, FOLATE, FERRITIN, TIBC, IRON, RETICCTPCT in the last 72 hours. Sepsis Labs: No results for input(s): PROCALCITON, LATICACIDVEN in the last 168 hours.  No results found for this or any previous visit (from the past 240 hours).       Radiology Studies: CT Head Wo Contrast Result Date: 03/07/2024 CLINICAL DATA:  Altered mental status. EXAM: CT HEAD WITHOUT CONTRAST TECHNIQUE: Contiguous axial images were obtained from the base of the skull through the vertex without intravenous contrast. RADIATION DOSE REDUCTION: This exam was performed according to the departmental dose-optimization program which includes automated exposure control, adjustment of the mA and/or kV according to patient size and/or use of iterative reconstruction technique. COMPARISON:  February 25, 2024 FINDINGS: Brain: There is generalized cerebral atrophy with widening of the extra-axial spaces and ventricular dilatation. There are areas of decreased attenuation within the white matter tracts of the supratentorial brain, consistent with  microvascular disease changes. Vascular: No hyperdense vessel or unexpected calcification. Skull: Normal. Negative for fracture or focal lesion. Sinuses/Orbits: No acute finding. Other: None. IMPRESSION: 1. Generalized cerebral atrophy and microvascular disease changes of the supratentorial brain. 2. No acute intracranial abnormality. Electronically Signed   By: Suzen Dials M.D.   On: 03/07/2024 16:20        Scheduled Meds:  atorvastatin   20 mg Oral Daily   benztropine   1 mg Oral BID   diazepam   5 mg Oral Once   divalproex   500 mg Oral BID   DULoxetine   40 mg Oral BID   enoxaparin  (LOVENOX ) injection  40 mg Subcutaneous Q24H   hydrOXYzine   100 mg Oral QHS    lisinopril   20 mg Oral Daily   metFORMIN   500 mg Oral BID   propranolol   10 mg Oral TID   valbenazine   80 mg Oral Daily   Continuous Infusions:   LOS: 1 day    Time spent: 35 Minutes    Chantay Whitelock A Mahamadou Weltz, MD [facility]   If 7PM-7AM, please contact night-coverage [email or web address]  03/08/2024, 7:13 AM

## 2024-03-09 ENCOUNTER — Telehealth: Payer: Self-pay

## 2024-03-09 ENCOUNTER — Inpatient Hospital Stay (HOSPITAL_COMMUNITY)
Admit: 2024-03-09 | Discharge: 2024-03-09 | Disposition: A | Discharge status: Home / Self Care | Attending: Internal Medicine | Admitting: Internal Medicine

## 2024-03-09 DIAGNOSIS — F319 Bipolar disorder, unspecified: Secondary | ICD-10-CM | POA: Diagnosis not present

## 2024-03-09 DIAGNOSIS — R410 Disorientation, unspecified: Secondary | ICD-10-CM | POA: Diagnosis not present

## 2024-03-09 DIAGNOSIS — M545 Low back pain, unspecified: Secondary | ICD-10-CM | POA: Diagnosis not present

## 2024-03-09 DIAGNOSIS — G9340 Encephalopathy, unspecified: Secondary | ICD-10-CM | POA: Diagnosis not present

## 2024-03-09 LAB — CBC
HCT: 47.9 % (ref 39.0–52.0)
Hemoglobin: 15.9 g/dL (ref 13.0–17.0)
MCH: 28.5 pg (ref 26.0–34.0)
MCHC: 33.2 g/dL (ref 30.0–36.0)
MCV: 86 fL (ref 80.0–100.0)
Platelets: 260 K/uL (ref 150–400)
RBC: 5.57 MIL/uL (ref 4.22–5.81)
RDW: 13.5 % (ref 11.5–15.5)
WBC: 6.4 K/uL (ref 4.0–10.5)
nRBC: 0 % (ref 0.0–0.2)

## 2024-03-09 LAB — BASIC METABOLIC PANEL WITH GFR
Anion gap: 12 (ref 5–15)
BUN: 19 mg/dL (ref 6–20)
CO2: 25 mmol/L (ref 22–32)
Calcium: 9.6 mg/dL (ref 8.9–10.3)
Chloride: 101 mmol/L (ref 98–111)
Creatinine, Ser: 0.92 mg/dL (ref 0.61–1.24)
GFR, Estimated: >60 mL/min (ref >60)
Glucose, Bld: 82 mg/dL (ref 70–99)
Potassium: 4.2 mmol/L (ref 3.5–5.1)
Sodium: 138 mmol/L (ref 135–145)

## 2024-03-09 LAB — GLUCOSE, CAPILLARY
Glucose-Capillary: 84 mg/dL (ref 70–99)
Glucose-Capillary: 118 mg/dL — ABNORMAL HIGH (ref 70–99)
Glucose-Capillary: 121 mg/dL — ABNORMAL HIGH (ref 70–99)
Glucose-Capillary: 102 mg/dL — ABNORMAL HIGH (ref 70–99)

## 2024-03-09 MED ORDER — THIAMINE HCL 100 MG/ML IJ SOLN
500.0000 mg | Freq: Every day | INTRAVENOUS | Status: AC
  Administered 2024-03-09 – 2024-03-12 (×4): 500 mg via INTRAVENOUS
  Filled 2024-03-09 (×4): qty 5

## 2024-03-09 MED ORDER — DIVALPROEX SODIUM 250 MG PO DR TAB
750.0000 mg | DELAYED_RELEASE_TABLET | Freq: Two times a day (BID) | ORAL | Status: DC
  Administered 2024-03-09 – 2024-03-28 (×38): 750 mg via ORAL
  Filled 2024-03-09 (×2): qty 3
  Filled 2024-03-09: qty 1
  Filled 2024-03-09 (×25): qty 3
  Filled 2024-03-09: qty 1
  Filled 2024-03-09 (×9): qty 3

## 2024-03-09 NOTE — TOC Progression Note (Signed)
 Transition of Care [facility]) - Progression Note    Patient Details  Name: PAUBLO WARSHAWSKY MRN: [id number] Date of Birth: 1966-03-11  [facility] of [facility]) CM/SW Contact  NORMAN ASPEN, LCSW Phone Number: 03/09/2024, 3:15 PM  Clinical Narrative:     Prior to patient's admission to unit, ED CSW had worked with the IP CM Director and [employer] Nursing program to secure a room at the Extended Stay on [facility].  Have attempted to determine if this can be still be done if patient is medically cleared for dc over weekend but no response yet.  MD aware.          Expected Discharge Plan and Services                                               Social Drivers of Health (SDOH) Interventions SDOH Screenings   Food Insecurity: Patient Unable To Answer (03/07/2024)  Housing: Unknown (03/07/2024)  Transportation Needs: Patient Unable To Answer (03/07/2024)  Utilities: Patient Unable To Answer (03/07/2024)  Depression (PHQ2-9): High Risk (01/26/2024)  Social Connections: Unknown (09/22/2021)   Received from [facility]  Tobacco Use: Low Risk  (02/29/2024)    Readmission Risk Interventions     No data to display

## 2024-03-09 NOTE — Progress Notes (Signed)
 EEG complete - results pending

## 2024-03-09 NOTE — Progress Notes (Signed)
 PROGRESS NOTE    [name] [name]  FMW:[id number] DOB: [birth year] DOA: 03/02/2024 PCP: [name] Jeoffrey [name], [name]   Brief Narrative: 58 year old with past medical history significant for bipolar disorder type I, tardive dyskinesia, PTSD, MDD, GAD, diabetes type 2, hyperlipidemia, GERD, osteoarthritis, tremors who initially presented to the ED on 10/12 for evaluation of fall and chronic back pain.  Patient was previously living with a friend for the last 2 or 3 years, due to patient's recent bizarre behavior and concern for her own safety patient's friend does not wish for patient to return to her home.  TOC has been working on placement for patient but has been unsuccessful.  On 10/19 psychiatry was consulted for evaluation due to patient's endorsing suicidal thoughts, with a plan to hurt himself.  Patient was initially recommended for inpatient psychiatric admission however patient was ultimately cleared by psych on 10/22.  Patient has remained in the ED since then with multiple unsuccessful attempts at placement due to financial and behavioral barriers.  On the day of admission there was initial plan to discharge patient to extended stay hotel however on reevaluation, patient was found to be altered during the discharge process.  Per ED physician patient had acute change in his baseline mental status with concern for acute delirium.  Evaluation for delirium labs unremarkable no signs of infection CT head with no acute intracranial abnormality.  Patient has being admitted for acute encephalopathy.   Assessment & Plan:   Principal Problem:   Acute encephalopathy Active Problems:   Chronic low back pain without sciatica   Acute delirium   Generalized weakness   Bipolar 1 disorder (HCC)   1-Acute Metabolic Encephalopathy: - Patient with history of multiple psychiatric disorder, he was boarding in the ED for 2 weeks now had some acute changes in mental status. - CT head: No acute intracranial  abnormality. -dehydration, Ammonia less than 13, UA negative -Follow MRI Pending , TSH;2.6 , B 12;573 , ABG: Hypoxia. , chest x ray. Hypoventilation.  Check EEG  Acute hypoxic respiratory failure, could be related to hypoventilation but also undiagnosed sleep apnea. Keep on 2 L of oxygen Chest x-ray showed hypoventilation   Bipolar 1 disorder Major depressive disorder GAD -Patient was cleared by psych initially while he was in the ED 10/22 - Continue benztropine , duloxetine , Depakote  and hydroxyzine  Psych consulted to assist adjusting medications.  Depakote  dose increased  Generalized weakness History of fall Chronic back pain -PT OT consulted  Dyskinesis Tremors -Continue with propranolol  and valbenazine   Diabetes type 2: With hypoglycemia Hold glipizide Hold Metformin .    Hypertension: Continue with lisinopril  Monitor renal function  Hyperlipidemia: Continue with statins.   Vitamin D  deficiency: resume vitamin d    Hemoconcentration, dehydration: Improved with IV fluids  Estimated body mass index is 27.65 kg/m as calculated from the following:   Height as of this encounter: 5' 10 (1.778 m).   Weight as of this encounter: 87.4 kg.   DVT prophylaxis: Lovenox  Code Status: Full code Family Communication: Disposition Plan:  Status is: Inpatient Remains inpatient appropriate because: management of encephalopathy     Consultants:  Psych   Procedures:  None  Antimicrobials:    Subjective: Was agitated last night received IV Haldol. This morning he was opening eyes, saying a few words, appears less sleepy. Discussed with the staff plan to keep oxygen supplementation for now Objective: Vitals:   03/08/24 1341 03/08/24 2026 03/08/24 2249 03/09/24 0452  BP: 130/85 (!) 114/90  113/83  Pulse: 82  71  Resp: 18 18  20   Temp: (!) 97.5 F (36.4 C) 98.1 F (36.7 C)  98.4 F (36.9 C)  TempSrc: Oral Oral  Oral  SpO2: 96% 95% 93% 94%  Weight:       Height:        Intake/Output Summary (Last 24 hours) at 03/09/2024 0725 Last data filed at 03/09/2024 0645 Gross per 24 hour  Intake 2290 ml  Output 500 ml  Net 1790 ml   Filed Weights   03/07/24 2229  Weight: 87.4 kg    Examination:  General exam: NAD Respiratory system: CTA Cardiovascular system: SS 1, S 2 RRR Gastrointestinal system: BS present, soft, nt Central nervous system: wake up says few words, not oriented.  Extremities: no edema   Data Reviewed: I have personally reviewed following labs and imaging studies  CBC: Recent Labs  Lab 03/02/24 1733 03/07/24 1535 03/08/24 0508  WBC 8.7 7.9 8.5  NEUTROABS 5.0 4.2  --   HGB 17.1* 17.7* 17.7*  HCT 51.9 53.4* 55.1*  MCV 85.8 85.9 87.0  PLT 313 316 333   Basic Metabolic Panel: Recent Labs  Lab 03/02/24 1733 03/07/24 1535 03/08/24 0508  NA 135 137 136  K 4.5 4.5 4.6  CL 97* 97* 98  CO2 28 28 22   GLUCOSE 98 99 60*  BUN 18 21* 22*  CREATININE 1.13 1.12 1.13  CALCIUM  10.0 10.2 10.3   GFR: Estimated Creatinine Clearance: 72.7 mL/min (by C-G formula based on SCr of 1.13 mg/dL). Liver Function Tests: Recent Labs  Lab 03/07/24 1535  AST 29  ALT 23  ALKPHOS 100  BILITOT 0.8  PROT 8.2*  ALBUMIN 4.6   No results for input(s): LIPASE, AMYLASE in the last 168 hours. Recent Labs  Lab 03/07/24 1535  AMMONIA <13   Coagulation Profile: No results for input(s): INR, PROTIME in the last 168 hours. Cardiac Enzymes: No results for input(s): CKTOTAL, CKMB, CKMBINDEX, TROPONINI in the last 168 hours. BNP (last 3 results) No results for input(s): PROBNP in the last 8760 hours. HbA1C: No results for input(s): HGBA1C in the last 72 hours. CBG: Recent Labs  Lab 03/03/24 0906 03/08/24 0730 03/08/24 1148 03/08/24 1633 03/08/24 2020  GLUCAP 113* 95 179* 104* 93   Lipid Profile: No results for input(s): CHOL, HDL, LDLCALC, TRIG, CHOLHDL, LDLDIRECT in the last 72  hours. Thyroid  Function Tests: Recent Labs    03/08/24 0508  TSH 2.640   Anemia Panel: Recent Labs    03/08/24 0508  VITAMINB12 573   Sepsis Labs: No results for input(s): PROCALCITON, LATICACIDVEN in the last 168 hours.  No results found for this or any previous visit (from the past 240 hours).       Radiology Studies: DG CHEST PORT 1 VIEW Result Date: 03/08/2024 CLINICAL DATA:  Hypoxia. EXAM: PORTABLE CHEST 1 VIEW COMPARISON:  12/26/2019. FINDINGS: The heart size and mediastinal contours are within normal limits. Lung volumes are low. No consolidation, effusion, or pneumothorax is seen. No acute osseous abnormality. IMPRESSION: Low lung volumes with no active disease. Electronically Signed   By: Leita Birmingham M.D.   On: 03/08/2024 14:59   CT Head Wo Contrast Result Date: 03/07/2024 CLINICAL DATA:  Altered mental status. EXAM: CT HEAD WITHOUT CONTRAST TECHNIQUE: Contiguous axial images were obtained from the base of the skull through the vertex without intravenous contrast. RADIATION DOSE REDUCTION: This exam was performed according to the departmental dose-optimization program which includes automated exposure control, adjustment of the mA and/or  kV according to patient size and/or use of iterative reconstruction technique. COMPARISON:  February 25, 2024 FINDINGS: Brain: There is generalized cerebral atrophy with widening of the extra-axial spaces and ventricular dilatation. There are areas of decreased attenuation within the white matter tracts of the supratentorial brain, consistent with microvascular disease changes. Vascular: No hyperdense vessel or unexpected calcification. Skull: Normal. Negative for fracture or focal lesion. Sinuses/Orbits: No acute finding. Other: None. IMPRESSION: 1. Generalized cerebral atrophy and microvascular disease changes of the supratentorial brain. 2. No acute intracranial abnormality. Electronically Signed   By: Suzen Dials M.D.   On:  03/07/2024 16:20        Scheduled Meds:  atorvastatin   20 mg Oral Daily   benztropine   1 mg Oral BID   diazepam   5 mg Oral Once   divalproex   500 mg Oral BID   DULoxetine   40 mg Oral BID   enoxaparin  (LOVENOX ) injection  40 mg Subcutaneous Q24H   hydrOXYzine   50 mg Oral QHS   insulin  aspart  0-6 Units Subcutaneous TID WC   lisinopril   20 mg Oral Daily   propranolol   10 mg Oral TID   valbenazine   80 mg Oral Daily   Vitamin D  (Ergocalciferol )  50,000 Units Oral Q7 days   Continuous Infusions:  lactated ringers  100 mL/hr at 03/09/24 0403     LOS: 2 days    Time spent: 35 Minutes    Timothee Gali A Jarret Torre, MD [facility]   If 7PM-7AM, please contact night-coverage [email or web address]  03/09/2024, 7:25 AM

## 2024-03-09 NOTE — Plan of Care (Signed)
  Problem: Pain Managment: Goal: General experience of comfort will improve and/or be controlled Outcome: Progressing

## 2024-03-09 NOTE — Plan of Care (Signed)
  Problem: Health Behavior/Discharge Planning: Goal: Ability to manage health-related needs will improve Outcome: Progressing   Problem: Pain Managment: Goal: General experience of comfort will improve and/or be controlled Outcome: Progressing

## 2024-03-09 NOTE — Telephone Encounter (Signed)
 Copied from CRM [id number]. Topic: General - Other >> Mar 09, 2024  1:58 PM Joesph NOVAK wrote: Reason for CRM: [facility] case manager is calling to let his provider know he admitted in to the hospital 03/07/24.

## 2024-03-09 NOTE — Consult Note (Signed)
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 updated  if appropriate.  Family History:  The patient's family history includes Anxiety disorder in his father and mother; Cancer in his paternal grandfather; Depression in his brother; Diabetes in his father; Heart attack in his maternal grandfather; Heart disease in his mother; Heart failure in his paternal grandmother; Kidney failure in his father; Mental illness in his sister; Thyroid  disease in his brother and sister.  Medical History: Past Medical History:  Diagnosis Date   Anxiety    Arthritis    Chest heaviness    Chills    Depression, major    Diabetes mellitus without complication (HCC)    Dizzy    Dyslipidemia    ED (erectile dysfunction)    Elevated CPK    GERD (gastroesophageal reflux disease)    HA (headache)    Hypertension    Hypertriglyceridemia    Insomnia    Kidney stone    Left-sided Bell's palsy    Morbid obesity (HCC)    Nausea    No appetite    Panniculitis    of right lower leg   Parkinson's disease (HCC)    PTSD (post-traumatic stress disorder)    Routine screening for STI (sexually transmitted infection) 01/05/2023   Weakness     Surgical History: Past Surgical History:  Procedure Laterality Date   NO PAST SURGERIES       Medications:   Current Facility-Administered Medications:    acetaminophen  (TYLENOL ) tablet 650 mg, 650 mg, Oral, Q6H PRN **OR** acetaminophen  (TYLENOL ) suppository 650 mg, 650 mg, Rectal, Q6H PRN, Lou, Claretta HERO, MD   atorvastatin  (LIPITOR) tablet 20 mg, 20 mg, Oral, Daily, Mannie Pac T, DO, 20 mg at 03/08/24 9040   benztropine  (  COGENTIN ) tablet 1 mg, 1 mg, Oral, BID, Mannie Pac T, DO, 1 mg at 03/08/24 2019   bisacodyl (DULCOLAX) EC tablet 5 mg, 5 mg, Oral, Daily PRN, Lou Claretta HERO, MD   diazepam  (VALIUM ) tablet 5 mg, 5 mg, Oral, Once, Elnor Savant A, DO   divalproex  (DEPAKOTE ) DR tablet 500 mg, 500 mg, Oral, BID, Mannie Pac T, DO, 500 mg at 03/08/24 2019   DULoxetine  (CYMBALTA ) DR capsule 40 mg, 40 mg,  Oral, BID, Mannie Pac T, DO, 40 mg at 03/08/24 2019   enoxaparin  (LOVENOX ) injection 40 mg, 40 mg, Subcutaneous, Q24H, Lou Claretta HERO, MD, 40 mg at 03/08/24 9040   hydrOXYzine  (ATARAX ) tablet 50 mg, 50 mg, Oral, QHS, Regalado, Belkys A, MD, 50 mg at 03/08/24 2019   insulin  aspart (novoLOG ) injection 0-6 Units, 0-6 Units, Subcutaneous, TID WC, Regalado, Belkys A, MD   lactated ringers  infusion, , Intravenous, Continuous, Regalado, Belkys A, MD, Last Rate: 100 mL/hr at 03/09/24 0403, New Bag at 03/09/24 0403   lisinopril  (ZESTRIL ) tablet 20 mg, 20 mg, Oral, Daily, Mannie Pac T, DO, 20 mg at 03/08/24 0957   ondansetron (ZOFRAN) tablet 4 mg, 4 mg, Oral, Q6H PRN **OR** ondansetron (ZOFRAN) injection 4 mg, 4 mg, Intravenous, Q6H PRN, Lou, Claretta HERO, MD   propranolol  (INDERAL ) tablet 10 mg, 10 mg, Oral, TID, Mannie Pac T, DO, 10 mg at 03/08/24 2019   senna-docusate (Senokot-S) tablet 1 tablet, 1 tablet, Oral, QHS PRN, Lou Claretta HERO, MD   thiamine (VITAMIN B1) 500 mg in sodium chloride  0.9 % 50 mL IVPB, 500 mg, Intravenous, Daily, Regalado, Belkys A, MD   valbenazine  (INGREZZA ) capsule 80 mg, 80 mg, Oral, Daily, Mannie Pac T, DO, 80 mg at 03/08/24 9041   Vitamin D  (Ergocalciferol ) (DRISDOL) 1.25 MG (50000 UNIT) capsule 50,000 Units, 50,000 Units, Oral, Q7 days, Regalado, Belkys A, MD, 50,000 Units at 03/08/24 0957  Allergies: No Known Allergies  Porfirio LITTIE Glatter, DO

## 2024-03-10 ENCOUNTER — Inpatient Hospital Stay (HOSPITAL_COMMUNITY)

## 2024-03-10 DIAGNOSIS — F319 Bipolar disorder, unspecified: Secondary | ICD-10-CM | POA: Diagnosis not present

## 2024-03-10 DIAGNOSIS — G9340 Encephalopathy, unspecified: Secondary | ICD-10-CM | POA: Diagnosis not present

## 2024-03-10 DIAGNOSIS — R569 Unspecified convulsions: Secondary | ICD-10-CM | POA: Diagnosis not present

## 2024-03-10 DIAGNOSIS — R410 Disorientation, unspecified: Secondary | ICD-10-CM | POA: Diagnosis not present

## 2024-03-10 LAB — GLUCOSE, CAPILLARY
Glucose-Capillary: 171 mg/dL — ABNORMAL HIGH (ref 70–99)
Glucose-Capillary: 133 mg/dL — ABNORMAL HIGH (ref 70–99)
Glucose-Capillary: 99 mg/dL (ref 70–99)
Glucose-Capillary: 110 mg/dL — ABNORMAL HIGH (ref 70–99)

## 2024-03-10 MED ORDER — HALOPERIDOL 5 MG PO TABS
5.0000 mg | ORAL_TABLET | Freq: Three times a day (TID) | ORAL | Status: DC | PRN
  Administered 2024-03-24 – 2024-03-27 (×5): 5 mg via ORAL
  Filled 2024-03-10 (×4): qty 1
  Filled 2024-03-10: qty 5
  Filled 2024-03-10: qty 1

## 2024-03-10 MED ORDER — HALOPERIDOL LACTATE 5 MG/ML IJ SOLN
5.0000 mg | Freq: Three times a day (TID) | INTRAMUSCULAR | Status: DC | PRN
  Administered 2024-03-10 – 2024-03-29 (×6): 5 mg via INTRAMUSCULAR
  Filled 2024-03-10 (×7): qty 1

## 2024-03-10 MED ORDER — LORAZEPAM 2 MG/ML IJ SOLN
1.0000 mg | Freq: Once | INTRAMUSCULAR | Status: DC
Start: 2024-03-10 — End: 2024-03-11
  Filled 2024-03-10: qty 1

## 2024-03-10 NOTE — Consult Note (Signed)
 [facility] Psychiatric Consult Initial  Patient Name: .CORDAY WYKA  MRN: [id number]  DOB: 1966/01/06  Consult Order details:  Orders (From admission, onward)     Start     Ordered   03/08/24 0958  IP CONSULT TO PSYCHIATRY       Ordering Provider: Madelyne Owen LABOR, MD  Provider:  (Not yet assigned)  Question Answer Comment  Location [facility]   Reason for Consult? adjustment of medications , admitted with delirium      03/08/24 0957             Mode of Visit: In person    Psychiatry Consult Evaluation  Service Date: March 10, 2024 LOS:  LOS: 3 days  Chief Complaint Delirium  Primary Psychiatric Diagnoses  Bipolar I Disorder 2.  Delirium  Assessment  NIRVAN LABAN is a 58 y.o. male admitted: Medicallyfor 03/02/2024  3:27 PM for Acute Encephalopathy. He carries the psychiatric diagnoses of Bipolar I Disorder.  On initial examination, patient can be seen laying in bed with sitter at bedside. The interview is complicated by the patient's lethargy and being in and out of consciousness. He was oriented to person, place, and time but disoriented to situation. He was unable to report why he was currently in the hospital. Per the sitter at bedside she was informed that the patient had a history of aggression but he had behaved well this morning. He was eating fine and the plan was to make sure that he remained awake during the day shift. The patient denied any SI/HI/AVH or paranoid thoughts about the staff. He stated that he was unsure where he would be able to go once he was discharged from the hospital. Please see plan below for detailed recommendations.   03/10/2024: Patient seen face to face in his hospital room. He is awake and alert but appears confused and disoriented to time, place, person and situation. He could not identify any familiar objects including pen and wrist watch. When asked how he's doing today, patient could not participate in any  meaningful conversation. Collateral information from the 1:1 staff at bedside indicates patient was irritated earlier today requiring soft restraints but denies aggression or agitation towards staff. We will continue to monitor patient for improvement.      Diagnoses:  Active Hospital problems: Principal Problem:   Acute encephalopathy Active Problems:   Chronic low back pain without sciatica   Acute delirium   Generalized weakness   Bipolar 1 disorder (HCC)    Plan   ## Psychiatric Medication Recommendations:  -Continue Cymbalta  40 mg PO BID -Continue Depakote  750 mg PO BID -Continue Hydroxyzine  50 mg at bedtime for sleep.  -Please avoid Valium  or any other Benzodiazepine due to risk of disinhibition and worsening confusion  -Give Haldol 5 mg PO/IM Q 8 HR PRN for acute agitation.  ## Medical Decision Making Capacity: Not specifically addressed in this encounter  ## Further Work-up:  -- Per primary team -- Pertinent labwork reviewed earlier this admission includes:  Recent Results (from the past 2160 hours)  Urinalysis, Routine w reflex microscopic -Urine, Clean Catch     Status: None   Collection Time: 02/19/24  7:48 AM  Result Value Ref Range   Color, Urine YELLOW YELLOW   APPearance CLEAR CLEAR   Specific Gravity, Urine 1.018 1.005 - 1.030   pH 6.0 5.0 - 8.0   Glucose, UA NEGATIVE NEGATIVE mg/dL   Hgb urine dipstick NEGATIVE NEGATIVE   Bilirubin  Urine NEGATIVE NEGATIVE   Ketones, ur NEGATIVE NEGATIVE mg/dL   Protein, ur NEGATIVE NEGATIVE mg/dL   Nitrite NEGATIVE NEGATIVE   Leukocytes,Ua NEGATIVE NEGATIVE    Comment: Performed at [facility], [address] [place], [place] [address]  CBC with Differential/Platelet     Status: None   Collection Time: 02/19/24  8:24 AM  Result Value Ref Range   WBC 8.4 4.0 - 10.5 K/uL   RBC 5.03 4.22 - 5.81 MIL/uL   Hemoglobin 14.2 13.0 - 17.0 g/dL   HCT 57.1 60.9 - 47.9 %   MCV 85.1 80.0 - 100.0 fL   MCH  28.2 26.0 - 34.0 pg   MCHC 33.2 30.0 - 36.0 g/dL   RDW 85.9 88.4 - 84.4 %   Platelets 217 150 - 400 K/uL   nRBC 0.0 0.0 - 0.2 %   Neutrophils Relative % 54 %   Neutro Abs 4.5 1.7 - 7.7 K/uL   Lymphocytes Relative 33 %   Lymphs Abs 2.8 0.7 - 4.0 K/uL   Monocytes Relative 11 %   Monocytes Absolute 0.9 0.1 - 1.0 K/uL   Eosinophils Relative 1 %   Eosinophils Absolute 0.0 0.0 - 0.5 K/uL   Basophils Relative 1 %   Basophils Absolute 0.1 0.0 - 0.1 K/uL   Immature Granulocytes 0 %   Abs Immature Granulocytes 0.03 0.00 - 0.07 K/uL    Comment: Performed at [facility], [address], [place], [place] [address]  Comprehensive metabolic panel with GFR     Status: Abnormal   Collection Time: 02/19/24  8:24 AM  Result Value Ref Range   Sodium 136 135 - 145 mmol/L   Potassium 4.1 3.5 - 5.1 mmol/L   Chloride 99 98 - 111 mmol/L   CO2 26 22 - 32 mmol/L   Glucose, Bld 122 (H) 70 - 99 mg/dL    Comment: Glucose reference range applies only to samples taken after fasting for at least 8 hours.   BUN 19 6 - 20 mg/dL   Creatinine, Ser 8.95 0.61 - 1.24 mg/dL   Calcium  9.8 8.9 - 10.3 mg/dL   Total Protein 6.9 6.5 - 8.1 g/dL   Albumin 4.2 3.5 - 5.0 g/dL   AST 17 15 - 41 U/L   ALT 10 0 - 44 U/L   Alkaline Phosphatase 49 38 - 126 U/L   Total Bilirubin 0.5 0.0 - 1.2 mg/dL   GFR, Estimated >39 >39 mL/min    Comment: (NOTE) Calculated using the CKD-EPI Creatinine Equation (2021)    Anion gap 11 5 - 15    Comment: Performed at [facility], [address], [place], [place] [address]  Comprehensive metabolic panel     Status: Abnormal   Collection Time: 02/25/24  7:59 PM  Result Value Ref Range   Sodium 135 135 - 145 mmol/L   Potassium 4.2 3.5 - 5.1 mmol/L   Chloride 99 98 - 111 mmol/L   CO2 23 22 - 32 mmol/L   Glucose, Bld 106 (H) 70 - 99 mg/dL    Comment: Glucose reference range applies only to samples taken after fasting for at least 8 hours.   BUN 17 6 - 20  mg/dL   Creatinine, Ser 8.80 0.61 - 1.24 mg/dL   Calcium  9.8 8.9 - 10.3 mg/dL   Total Protein 7.3 6.5 - 8.1 g/dL   Albumin 3.9 3.5 - 5.0 g/dL   AST 18 15 - 41 U/L  ALT 17 0 - 44 U/L   Alkaline Phosphatase 57 38 - 126 U/L   Total Bilirubin 1.2 0.0 - 1.2 mg/dL   GFR, Estimated >39 >39 mL/min    Comment: (NOTE) Calculated using the CKD-EPI Creatinine Equation (2021)    Anion gap 13 5 - 15    Comment: Performed at [facility], [address], [place], [place] [address]  CBC     Status: None   Collection Time: 02/25/24  7:59 PM  Result Value Ref Range   WBC 9.7 4.0 - 10.5 K/uL   RBC 5.73 4.22 - 5.81 MIL/uL   Hemoglobin 16.4 13.0 - 17.0 g/dL   HCT 51.6 60.9 - 47.9 %   MCV 84.3 80.0 - 100.0 fL   MCH 28.6 26.0 - 34.0 pg   MCHC 34.0 30.0 - 36.0 g/dL   RDW 85.9 88.4 - 84.4 %   Platelets 288 150 - 400 K/uL   nRBC 0.0 0.0 - 0.2 %    Comment: Performed at [facility], [address], [place], [place] [address]  CBG monitoring, ED     Status: Abnormal   Collection Time: 02/25/24  8:01 PM  Result Value Ref Range   Glucose-Capillary 102 (H) 70 - 99 mg/dL    Comment: Glucose reference range applies only to samples taken after fasting for at least 8 hours.  Ammonia     Status: None   Collection Time: 02/25/24  8:33 PM  Result Value Ref Range   Ammonia 34 9 - 35 umol/L    Comment: HEMOLYSIS AT THIS LEVEL MAY AFFECT RESULT Performed at [facility], [address], [place], [place] [address]   Resp panel by RT-PCR (RSV, Flu A&B, Covid) Anterior Nasal Swab     Status: None   Collection Time: 02/25/24  8:45 PM   Specimen: Anterior Nasal Swab  Result Value Ref Range   SARS Coronavirus 2 by RT PCR NEGATIVE NEGATIVE   Influenza A by PCR NEGATIVE NEGATIVE   Influenza B by PCR NEGATIVE NEGATIVE    Comment: (NOTE) The Xpert Xpress SARS-CoV-2/FLU/RSV plus assay is intended as an aid in the diagnosis of influenza from Nasopharyngeal swab specimens and should not be used as a  sole basis for treatment. Nasal washings and aspirates are unacceptable for Xpert Xpress SARS-CoV-2/FLU/RSV testing.  Fact Sheet for Patients: bloggercourse.com  Fact Sheet for Healthcare Providers: seriousbroker.it  This test is not yet approved or cleared by the [place]  FDA and has been authorized for detection and/or diagnosis of SARS-CoV-2 by FDA under an Emergency Use Authorization (EUA). This EUA will remain in effect (meaning this test can be used) for the duration of the COVID-19 declaration under Section 564(b)(1) of the Act, 21 U.S.C. section 360bbb-3(b)(1), unless the authorization is terminated or revoked.     Resp Syncytial Virus by PCR NEGATIVE NEGATIVE    Comment: (NOTE) Fact Sheet for Patients: bloggercourse.com  Fact Sheet for Healthcare Providers: seriousbroker.it  This test is not yet approved or cleared by the [place]  FDA and has been authorized for detection and/or diagnosis of SARS-CoV-2 by FDA under an Emergency Use Authorization (EUA). This EUA will remain in effect (meaning this test can be used) for the duration of the COVID-19 declaration under Section 564(b)(1) of the Act, 21 U.S.C. section 360bbb-3(b)(1), unless the authorization is terminated or revoked.  Performed at [facility], [address] [place], [place] [address]   Ethanol  Status: None   Collection Time: 02/25/24  9:00 PM  Result Value Ref Range   Alcohol, Ethyl (B) <15 <15 mg/dL    Comment: (NOTE) For medical purposes only. Performed at [facility], [address], [place], [place] [address]   Urinalysis, Routine w reflex microscopic -Urine, Clean Catch     Status: Abnormal   Collection Time: 02/25/24 11:30 PM  Result Value Ref Range   Color, Urine YELLOW YELLOW   APPearance CLEAR CLEAR   Specific Gravity, Urine 1.018 1.005 - 1.030   pH 5.0 5.0 -  8.0   Glucose, UA NEGATIVE NEGATIVE mg/dL   Hgb urine dipstick NEGATIVE NEGATIVE   Bilirubin Urine NEGATIVE NEGATIVE   Ketones, ur 5 (A) NEGATIVE mg/dL   Protein, ur NEGATIVE NEGATIVE mg/dL   Nitrite NEGATIVE NEGATIVE   Leukocytes,Ua NEGATIVE NEGATIVE    Comment: Performed at [facility], [address] [place], [place] [address]  Rapid urine drug screen (hospital performed)     Status: None   Collection Time: 02/25/24 11:30 PM  Result Value Ref Range   Opiates NONE DETECTED NONE DETECTED   Cocaine NONE DETECTED NONE DETECTED   Benzodiazepines NONE DETECTED NONE DETECTED   Amphetamines NONE DETECTED NONE DETECTED   Tetrahydrocannabinol NONE DETECTED NONE DETECTED   Barbiturates NONE DETECTED NONE DETECTED    Comment: (NOTE) DRUG SCREEN FOR MEDICAL PURPOSES ONLY.  IF CONFIRMATION IS NEEDED FOR ANY PURPOSE, NOTIFY LAB WITHIN 5 DAYS.  LOWEST DETECTABLE LIMITS FOR URINE DRUG SCREEN Drug Class                     Cutoff (ng/mL) Amphetamine and metabolites    1000 Barbiturate and metabolites    200 Benzodiazepine                 200 Opiates and metabolites        300 Cocaine and metabolites        300 THC                            50 Performed at [facility], [address], [place], [place] [address]   Basic metabolic panel     Status: Abnormal   Collection Time: 03/02/24  5:33 PM  Result Value Ref Range   Sodium 135 135 - 145 mmol/L   Potassium 4.5 3.5 - 5.1 mmol/L   Chloride 97 (L) 98 - 111 mmol/L   CO2 28 22 - 32 mmol/L   Glucose, Bld 98 70 - 99 mg/dL    Comment: Glucose reference range applies only to samples taken after fasting for at least 8 hours.   BUN 18 6 - 20 mg/dL   Creatinine, Ser 8.86 0.61 - 1.24 mg/dL   Calcium  10.0 8.9 - 10.3 mg/dL   GFR, Estimated >39 >39 mL/min    Comment: (NOTE) Calculated using the CKD-EPI Creatinine Equation (2021)    Anion gap 11 5 - 15    Comment: Performed at [facility], [address],  [place], [place] [address]  CBC with Differential     Status: Abnormal   Collection Time: 03/02/24  5:33 PM  Result Value Ref Range   WBC 8.7 4.0 - 10.5 K/uL   RBC 6.05 (H) 4.22 - 5.81 MIL/uL   Hemoglobin 17.1 (H) 13.0 - 17.0 g/dL   HCT 48.0 60.9 - 47.9 %   MCV 85.8 80.0 -  100.0 fL   MCH 28.3 26.0 - 34.0 pg   MCHC 32.9 30.0 - 36.0 g/dL   RDW 86.2 88.4 - 84.4 %   Platelets 313 150 - 400 K/uL   nRBC 0.0 0.0 - 0.2 %   Neutrophils Relative % 56 %   Neutro Abs 5.0 1.7 - 7.7 K/uL   Lymphocytes Relative 32 %   Lymphs Abs 2.8 0.7 - 4.0 K/uL   Monocytes Relative 9 %   Monocytes Absolute 0.8 0.1 - 1.0 K/uL   Eosinophils Relative 1 %   Eosinophils Absolute 0.1 0.0 - 0.5 K/uL   Basophils Relative 1 %   Basophils Absolute 0.1 0.0 - 0.1 K/uL   Immature Granulocytes 1 %   Abs Immature Granulocytes 0.04 0.00 - 0.07 K/uL    Comment: Performed at [facility], [address], [place], [place] [address]  CBG monitoring, ED     Status: Abnormal   Collection Time: 03/03/24  9:06 AM  Result Value Ref Range   Glucose-Capillary 113 (H) 70 - 99 mg/dL    Comment: Glucose reference range applies only to samples taken after fasting for at least 8 hours.  Ammonia     Status: None   Collection Time: 03/07/24  3:35 PM  Result Value Ref Range   Ammonia <13 9 - 35 umol/L    Comment: Performed at [facility], [address] [place], [place] [address]  CBC with Differential     Status: Abnormal   Collection Time: 03/07/24  3:35 PM  Result Value Ref Range   WBC 7.9 4.0 - 10.5 K/uL   RBC 6.22 (H) 4.22 - 5.81 MIL/uL   Hemoglobin 17.7 (H) 13.0 - 17.0 g/dL   HCT 46.5 (H) 60.9 - 47.9 %   MCV 85.9 80.0 - 100.0 fL   MCH 28.5 26.0 - 34.0 pg   MCHC 33.1 30.0 - 36.0 g/dL   RDW 86.2 88.4 - 84.4 %   Platelets 316 150 - 400 K/uL   nRBC 0.0 0.0 - 0.2 %   Neutrophils Relative % 53 %   Neutro Abs 4.2 1.7 - 7.7 K/uL   Lymphocytes Relative 34 %   Lymphs Abs 2.7 0.7 - 4.0 K/uL    Monocytes Relative 10 %   Monocytes Absolute 0.8 0.1 - 1.0 K/uL   Eosinophils Relative 2 %   Eosinophils Absolute 0.1 0.0 - 0.5 K/uL   Basophils Relative 1 %   Basophils Absolute 0.1 0.0 - 0.1 K/uL   Immature Granulocytes 0 %   Abs Immature Granulocytes 0.02 0.00 - 0.07 K/uL    Comment: Performed at [facility], [address], [place], [place] [address]  Comprehensive metabolic panel     Status: Abnormal   Collection Time: 03/07/24  3:35 PM  Result Value Ref Range   Sodium 137 135 - 145 mmol/L   Potassium 4.5 3.5 - 5.1 mmol/L   Chloride 97 (L) 98 - 111 mmol/L   CO2 28 22 - 32 mmol/L   Glucose, Bld 99 70 - 99 mg/dL    Comment: Glucose reference range applies only to samples taken after fasting for at least 8 hours.   BUN 21 (H) 6 - 20 mg/dL   Creatinine, Ser 8.87 0.61 - 1.24 mg/dL   Calcium  10.2 8.9 - 10.3 mg/dL   Total Protein 8.2 (H) 6.5 - 8.1 g/dL   Albumin 4.6 3.5 - 5.0 g/dL   AST 29 15 - 41 U/L  ALT 23 0 - 44 U/L   Alkaline Phosphatase 100 38 - 126 U/L   Total Bilirubin 0.8 0.0 - 1.2 mg/dL   GFR, Estimated >39 >39 mL/min    Comment: (NOTE) Calculated using the CKD-EPI Creatinine Equation (2021)    Anion gap 12 5 - 15    Comment: Performed at [facility], [address] [place], [place] [address]  Urinalysis, w/ Reflex to Culture (Infection Suspected) -Urine, Clean Catch     Status: Abnormal   Collection Time: 03/07/24  8:22 PM  Result Value Ref Range   Specimen Source URINE, CLEAN CATCH    Color, Urine YELLOW YELLOW   APPearance CLEAR CLEAR   Specific Gravity, Urine 1.024 1.005 - 1.030   pH 6.0 5.0 - 8.0   Glucose, UA NEGATIVE NEGATIVE mg/dL   Hgb urine dipstick NEGATIVE NEGATIVE   Bilirubin Urine NEGATIVE NEGATIVE   Ketones, ur 5 (A) NEGATIVE mg/dL   Protein, ur NEGATIVE NEGATIVE mg/dL   Nitrite NEGATIVE NEGATIVE   Leukocytes,Ua TRACE (A) NEGATIVE   RBC / HPF 0-5 0 - 5 RBC/hpf   WBC, UA 0-5 0 - 5 WBC/hpf    Comment:         Reflex urine culture not performed if WBC <=10, OR if Squamous epithelial cells >5. If Squamous epithelial cells >5 suggest recollection.    Bacteria, UA RARE (A) NONE SEEN   Squamous Epithelial / HPF 0-5 0 - 5 /HPF   Mucus PRESENT     Comment: Performed at [facility], [address], [place], [place] [address]  HIV Antibody (routine testing w rflx)     Status: None   Collection Time: 03/08/24  5:08 AM  Result Value Ref Range   HIV Screen 4th Generation wRfx Non Reactive Non Reactive    Comment: Performed at [facility], [address], [place], [place] [address]  Basic metabolic panel     Status: Abnormal   Collection Time: 03/08/24  5:08 AM  Result Value Ref Range   Sodium 136 135 - 145 mmol/L   Potassium 4.6 3.5 - 5.1 mmol/L   Chloride 98 98 - 111 mmol/L   CO2 22 22 - 32 mmol/L   Glucose, Bld 60 (L) 70 - 99 mg/dL    Comment: Glucose reference range applies only to samples taken after fasting for at least 8 hours.   BUN 22 (H) 6 - 20 mg/dL   Creatinine, Ser 8.86 0.61 - 1.24 mg/dL   Calcium  10.3 8.9 - 10.3 mg/dL   GFR, Estimated >39 >39 mL/min    Comment: (NOTE) Calculated using the CKD-EPI Creatinine Equation (2021)    Anion gap 17 (H) 5 - 15    Comment: Performed at [facility], [address], [place], [place] [address]  CBC     Status: Abnormal   Collection Time: 03/08/24  5:08 AM  Result Value Ref Range   WBC 8.5 4.0 - 10.5 K/uL   RBC 6.33 (H) 4.22 - 5.81 MIL/uL   Hemoglobin 17.7 (H) 13.0 - 17.0 g/dL   HCT 44.8 (H) 60.9 - 47.9 %   MCV 87.0 80.0 - 100.0 fL   MCH 28.0 26.0 - 34.0 pg   MCHC 32.1 30.0 - 36.0 g/dL   RDW 85.9 88.4 - 84.4 %   Platelets 333 150 - 400 K/uL   nRBC 0.0 0.0 - 0.2 %    Comment: Performed at [facility], [address], [place], [place] [address]  Vitamin B12     Status: None   Collection Time: 03/08/24  5:08 AM  Result Value Ref Range   Vitamin B-12 573 180 - 914 pg/mL     Comment: Performed at [facility], [address] [place], [place] [address]  TSH     Status: None   Collection Time: 03/08/24  5:08 AM  Result Value Ref Range   TSH 2.640 0.350 - 4.500 uIU/mL    Comment: Performed at [facility], [address], [place], [place] [address]  Glucose, capillary     Status: None   Collection Time: 03/08/24  7:30 AM  Result Value Ref Range   Glucose-Capillary 95 70 - 99 mg/dL    Comment: Glucose reference range applies only to samples taken after fasting for at least 8 hours.  Blood gas, arterial     Status: Abnormal   Collection Time: 03/08/24 10:15 AM  Result Value Ref Range   FIO2 21 %   pH, Arterial 7.49 (H) 7.35 - 7.45   pCO2 arterial 34 32 - 48 mmHg   pO2, Arterial 65 (L) 83 - 108 mmHg   Bicarbonate 26.1 20.0 - 28.0 mmol/L   Acid-Base Excess 2.8 (H) 0.0 - 2.0 mmol/L   O2 Saturation 95.5 %   Patient temperature 36.7    Collection site RIGHT RADIAL    Drawn by [id number]    Allens test (pass/fail) PASS PASS    Comment: Performed at [facility], [address], [place], [place] [address]  Glucose, capillary     Status: Abnormal   Collection Time: 03/08/24 11:48 AM  Result Value Ref Range   Glucose-Capillary 179 (H) 70 - 99 mg/dL    Comment: Glucose reference range applies only to samples taken after fasting for at least 8 hours.  Valproic acid  level     Status: None   Collection Time: 03/08/24  3:41 PM  Result Value Ref Range   Valproic Acid  Lvl 73 50 - 100 ug/mL    Comment: Performed at [facility], [address] [place], [place] [address]  Glucose, capillary     Status: Abnormal   Collection Time: 03/08/24  4:33 PM  Result Value Ref Range   Glucose-Capillary 104 (H) 70 - 99 mg/dL    Comment: Glucose reference range applies only to samples taken after fasting for at least 8 hours.  Glucose, capillary     Status: None   Collection Time: 03/08/24  8:20 PM   Result Value Ref Range   Glucose-Capillary 93 70 - 99 mg/dL    Comment: Glucose reference range applies only to samples taken after fasting for at least 8 hours.  Glucose, capillary     Status: None   Collection Time: 03/09/24  7:29 AM  Result Value Ref Range   Glucose-Capillary 84 70 - 99 mg/dL    Comment: Glucose reference range applies only to samples taken after fasting for at least 8 hours.  CBC     Status: None   Collection Time: 03/09/24  8:21 AM  Result Value Ref Range   WBC 6.4 4.0 - 10.5 K/uL   RBC 5.57 4.22 - 5.81 MIL/uL   Hemoglobin 15.9 13.0 - 17.0 g/dL   HCT 52.0 60.9 - 47.9 %   MCV 86.0 80.0 - 100.0 fL   MCH 28.5 26.0 - 34.0 pg   MCHC 33.2 30.0 - 36.0 g/dL   RDW 13.5  11.5 - 15.5 %   Platelets 260 150 - 400 K/uL   nRBC 0.0 0.0 - 0.2 %    Comment: Performed at [facility], [address] [place], [place] [address]  Basic metabolic panel     Status: None   Collection Time: 03/09/24  8:21 AM  Result Value Ref Range   Sodium 138 135 - 145 mmol/L   Potassium 4.2 3.5 - 5.1 mmol/L   Chloride 101 98 - 111 mmol/L   CO2 25 22 - 32 mmol/L   Glucose, Bld 82 70 - 99 mg/dL    Comment: Glucose reference range applies only to samples taken after fasting for at least 8 hours.   BUN 19 6 - 20 mg/dL   Creatinine, Ser 9.07 0.61 - 1.24 mg/dL   Calcium  9.6 8.9 - 10.3 mg/dL   GFR, Estimated >39 >39 mL/min    Comment: (NOTE) Calculated using the CKD-EPI Creatinine Equation (2021)    Anion gap 12 5 - 15    Comment: Performed at [facility], [address] [place], [place] [address]  Glucose, capillary     Status: Abnormal   Collection Time: 03/09/24 11:32 AM  Result Value Ref Range   Glucose-Capillary 118 (H) 70 - 99 mg/dL    Comment: Glucose reference range applies only to samples taken after fasting for at least 8 hours.  Glucose, capillary     Status: Abnormal   Collection Time: 03/09/24  4:17 PM  Result Value Ref Range    Glucose-Capillary 121 (H) 70 - 99 mg/dL    Comment: Glucose reference range applies only to samples taken after fasting for at least 8 hours.  Glucose, capillary     Status: Abnormal   Collection Time: 03/09/24  9:13 PM  Result Value Ref Range   Glucose-Capillary 102 (H) 70 - 99 mg/dL    Comment: Glucose reference range applies only to samples taken after fasting for at least 8 hours.  Glucose, capillary     Status: Abnormal   Collection Time: 03/10/24  9:29 AM  Result Value Ref Range   Glucose-Capillary 171 (H) 70 - 99 mg/dL    Comment: Glucose reference range applies only to samples taken after fasting for at least 8 hours.  Glucose, capillary     Status: Abnormal   Collection Time: 03/10/24 12:57 PM  Result Value Ref Range   Glucose-Capillary 133 (H) 70 - 99 mg/dL    Comment: Glucose reference range applies only to samples taken after fasting for at least 8 hours.      ## Disposition:-- There are no psychiatric contraindications to discharge at this time  ## Behavioral / Environmental: -Delirium Precautions: Delirium Interventions for Nursing and Staff: - RN to open blinds every AM. - To Bedside: Glasses, hearing aide, and pt's own shoes. Make available to patients. when possible and encourage use. - Encourage po fluids when appropriate, keep fluids within reach. - OOB to chair with meals. - Passive ROM exercises to all extremities with AM & PM care. - RN to assess orientation to person, time and place QAM and PRN. - Recommend extended visitation hours with familiar family/friends as feasible. - Staff to minimize disturbances at night. Turn off television when pt asleep or when not in use.    ## Safety and Observation Level:  - Based on my clinical evaluation, I estimate the patient to be at minimal risk of self harm in the current setting. - At this time, we  recommend  1:1 Observation. This decision is based on my review of the chart including patient's history and current  presentation, interview of the patient, mental status examination, and consideration of suicide risk including evaluating suicidal ideation, plan, intent, suicidal or self-harm behaviors, risk factors, and protective factors. This judgment is based on our ability to directly address suicide risk, implement suicide prevention strategies, and develop a safety plan while the patient is in the clinical setting. Please contact our team if there is a concern that risk level has changed.  CSSR Risk Category:C-SSRS RISK CATEGORY: No Risk  Suicide Risk Assessment: Patient has following modifiable risk factors for suicide: medication noncompliance, which we are addressing by currently in the hospital receiving medications but will need safe discharge plan. Patient has following non-modifiable or demographic risk factors for suicide: male gender Patient has the following protective factors against suicide: NA  Thank you for this consult request. Recommendations have been communicated to the primary team.  We will continue to follow at this time.   Jan DELENA Donath, MD       History of Present Illness  Patient Report:  The interview is complicated by the patient's lethargy and being in and out of consciousness. He was oriented to person, place, and time but disoriented to situation. He was unable to report why he was currently in the hospital. Per the sitter at bedside she was informed that the patient had a history of aggression but he had behaved well this morning. He was eating fine and the plan was to make sure that he remained awake during the day shift. The patient denied any SI/HI/AVH or paranoid thoughts about the staff. He stated that he was unsure where he would be able to go once he was discharged from the hospital   Collateral information:  Sari Darlis Benne [phone] Called twice and left message  ROS   Psychiatric and Social History  Psychiatric History:  Information collected  from Patient  Prev Dx/Sx: Bipolar Disorder Current Psych Provider: [facility] Home Meds (current): Cymbalta  and Depakote  Previous Med Trials: Cymablta, Depakote , Ingrezza  Therapy: [facility]  Prior Psych Hospitalization: Yes  Prior Self Harm: Denies Prior Violence: Yes  Family Psych History: [facility] Family Hx suicide: [facility]  Social History:  Developmental Hx: Unremarkable Educational Hx: [facility] Occupational Hx: Unemployed Legal Hx: [facility]TA Living Situation: Homeless Spiritual Hx: [facility] Access to weapons/lethal means: Denies   Substance History Alcohol: Denies  Tobacco: Denies  Illicit drugs: Denies  Prescription drug abuse: Denies  Rehab hx: Denies   Exam Findings  Physical Exam:  Vital Signs:  Temp:  [98 F (36.7 C)-98.1 F (36.7 C)] 98 F (36.7 C) (11/01 0556) Pulse Rate:  [65-95] 95 (11/01 1011) Resp:  [18-20] 19 (11/01 0556) BP: (108-166)/(74-130) 108/95 (11/01 1011) SpO2:  [94 %-97 %] 95 % (11/01 0556) Blood pressure (!) 108/95, pulse 95, temperature 98 F (36.7 C), resp. rate 19, height 5' 10 (1.778 m), weight 87.4 kg, SpO2 95%. Body mass index is 27.65 kg/m.  Physical Exam  Mental Status Exam: General Appearance: Disheveled  Orientation:  Person, place, and time disoriented to situation  Memory:  Poor  Concentration:  Poor  Recall:  Poor  Attention  Poor  Eye Contact:  Poor  Speech:  Mumbled  Language:  Good  Volume:  Decreased  Mood: Fine  Affect:  Irritable  Thought Process:  Disorganized  Thought Content:  Illogical  Suicidal Thoughts:  No  Homicidal Thoughts:  No  Judgement:  Poor  Insight:  Poor  Psychomotor Activity:  Tremor  Akathisia:  No  Fund of Knowledge:  Estimated to be below average      Assets:  None  Cognition:  Impaired,  Moderate  ADL's:  Impaired  AIMS (if indicated):        Other History   These have been pulled in through the EMR, reviewed, and updated if appropriate.  Family History:  The patient's family history includes Anxiety  disorder in his father and mother; Cancer in his paternal grandfather; Depression in his brother; Diabetes in his father; Heart attack in his maternal grandfather; Heart disease in his mother; Heart failure in his paternal grandmother; Kidney failure in his father; Mental illness in his sister; Thyroid  disease in his brother and sister.  Medical History: Past Medical History:  Diagnosis Date   Anxiety    Arthritis    Chest heaviness    Chills    Depression, major    Diabetes mellitus without complication (HCC)    Dizzy    Dyslipidemia    ED (erectile dysfunction)    Elevated CPK    GERD (gastroesophageal reflux disease)    HA (headache)    Hypertension    Hypertriglyceridemia    Insomnia    Kidney stone    Left-sided Bell's palsy    Morbid obesity (HCC)    Nausea    No appetite    Panniculitis    of right lower leg   Parkinson's disease (HCC)    PTSD (post-traumatic stress disorder)    Routine screening for STI (sexually transmitted infection) 01/05/2023   Weakness     Surgical History: Past Surgical History:  Procedure Laterality Date   NO PAST SURGERIES       Medications:   Current Facility-Administered Medications:    acetaminophen  (TYLENOL ) tablet 650 mg, 650 mg, Oral, Q6H PRN **OR** acetaminophen  (TYLENOL ) suppository 650 mg, 650 mg, Rectal, Q6H PRN, Lou, Claretta HERO, MD   atorvastatin  (LIPITOR) tablet 20 mg, 20 mg, Oral, Daily, Mannie Pac T, DO, 20 mg at 03/10/24 9057   benztropine  (COGENTIN ) tablet 1 mg, 1 mg, Oral, BID, Mannie Pac T, DO, 1 mg at 03/10/24 9057   bisacodyl (DULCOLAX) EC tablet 5 mg, 5 mg, Oral, Daily PRN, Lou Claretta HERO, MD   diazepam  (VALIUM ) tablet 5 mg, 5 mg, Oral, Once, Elnor Savant A, DO   divalproex  (DEPAKOTE ) DR tablet 750 mg, 750 mg, Oral, BID, Mitchell, Jerrell L, DO, 750 mg at 03/10/24 0941   DULoxetine  (CYMBALTA ) DR capsule 40 mg, 40 mg, Oral, BID, Mannie Pac T, DO, 40 mg at 03/10/24 0942   enoxaparin  (LOVENOX )  injection 40 mg, 40 mg, Subcutaneous, Q24H, Amponsah, Claretta HERO, MD, 40 mg at 03/10/24 0943   hydrOXYzine  (ATARAX ) tablet 50 mg, 50 mg, Oral, QHS, Regalado, Belkys A, MD, 50 mg at 03/09/24 2106   insulin  aspart (novoLOG ) injection 0-6 Units, 0-6 Units, Subcutaneous, TID WC, Regalado, Belkys A, MD, 1 Units at 03/10/24 0949   lactated ringers  infusion, , Intravenous, Continuous, Regalado, Belkys A, MD, Last Rate: 100 mL/hr at 03/10/24 0917, New Bag at 03/10/24 0917   lisinopril  (ZESTRIL ) tablet 20 mg, 20 mg, Oral, Daily, Mannie Pac T, DO, 20 mg at 03/09/24 1123   ondansetron (ZOFRAN) tablet 4 mg, 4 mg, Oral, Q6H PRN **OR** ondansetron (ZOFRAN) injection 4 mg, 4 mg, Intravenous, Q6H PRN, Lou, Claretta HERO, MD   propranolol  (INDERAL ) tablet 10 mg, 10 mg, Oral, TID, Mannie Pac T, DO, 10 mg at  03/09/24 2106   senna-docusate (Senokot-S) tablet 1 tablet, 1 tablet, Oral, QHS PRN, Lou Claretta HERO, MD   thiamine (VITAMIN B1) 500 mg in sodium chloride  0.9 % 50 mL IVPB, 500 mg, Intravenous, Daily, Regalado, Belkys A, MD, Last Rate: 110 mL/hr at 03/10/24 1000, 500 mg at 03/10/24 1000   valbenazine  (INGREZZA ) capsule 80 mg, 80 mg, Oral, Daily, Mannie Pac T, DO, 80 mg at 03/10/24 0942   Vitamin D  (Ergocalciferol ) (DRISDOL) 1.25 MG (50000 UNIT) capsule 50,000 Units, 50,000 Units, Oral, Q7 days, Regalado, Belkys A, MD, 50,000 Units at 03/08/24 9042  Allergies: No Known Allergies  Hetty Linhart DELENA Donath, MD

## 2024-03-10 NOTE — Procedures (Signed)
 Patient Name: KALDEN WANKE  MRN: [id number]  Epilepsy Attending: Arlin MALVA Krebs  Referring Physician/Provider: Madelyne Owen LABOR, MD  Date: 03/09/2024 Duration: 23.15 mins  Patient history: 58yo male with ams. EEG to evaluate for seizure  Level of alertness: Awake  AEDs during EEG study: VPA, Valium   Technical aspects: This EEG study was done with scalp electrodes positioned according to the 10-20 International system of electrode placement. Electrical activity was reviewed with band pass filter of 1-70Hz , sensitivity of 7 uV/mm, display speed of 32mm/sec with a 60Hz  notched filter applied as appropriate. EEG data were recorded continuously and digitally stored.  Video monitoring was available and reviewed as appropriate.  Description: EEG showed continuous generalized 3 to 6 Hz theta-delta slowing. Hyperventilation and photic stimulation were not performed.     ABNORMALITY - Continuous slow, generalized  IMPRESSION: This study is suggestive of generalized cerebra dysfunction (encephalopathy). No seizures or epileptiform discharges were seen throughout the recording.  Evana Runnels O Sewell Pitner

## 2024-03-10 NOTE — Care Plan (Signed)
 Patient not able to do MRI test at this time. Pt is combative not a&o, pt was given meds but that didn't help. Pt needs to be able to hold still and follow commands. RN will notify the provider.

## 2024-03-10 NOTE — Plan of Care (Signed)
   Problem: Nutrition: Goal: Adequate nutrition will be maintained Outcome: Progressing

## 2024-03-10 NOTE — Progress Notes (Signed)
 PROGRESS NOTE    [name] [name]  FMW:[id number] DOB: February 21, 1966 DOA: 03/02/2024 PCP: [name] Jeoffrey [name], [name]   Brief Narrative: 58 year old with past medical history significant for bipolar disorder type I, tardive dyskinesia, PTSD, MDD, GAD, diabetes type 2, hyperlipidemia, GERD, osteoarthritis, tremors who initially presented to the ED on 10/12 for evaluation of fall and chronic back pain.  Patient was previously living with a friend for the last 2 or 3 years, due to patient's recent bizarre behavior and concern for her own safety patient's friend does not wish for patient to return to her home.  TOC has been working on placement for patient but has been unsuccessful.  On 10/19 psychiatry was consulted for evaluation due to patient's endorsing suicidal thoughts, with a plan to hurt himself.  Patient was initially recommended for inpatient psychiatric admission however patient was ultimately cleared by psych on 10/22.  Patient has remained in the ED since then with multiple unsuccessful attempts at placement due to financial and behavioral barriers.  On the day of admission there was initial plan to discharge patient to extended stay hotel however on reevaluation, patient was found to be altered during the discharge process.  Per ED physician patient had acute change in his baseline mental status with concern for acute delirium.  Evaluation for delirium labs unremarkable no signs of infection CT head with no acute intracranial abnormality.  Patient has being admitted for acute encephalopathy.   Assessment & Plan:   Principal Problem:   Acute encephalopathy Active Problems:   Chronic low back pain without sciatica   Acute delirium   Generalized weakness   Bipolar 1 disorder (HCC)   1-Acute Metabolic Encephalopathy: - Patient with history of multiple psychiatric disorder, he was boarding in the ED for 2 weeks now had some acute changes in mental status. - CT head: No acute intracranial  abnormality. -dehydration, Ammonia less than 13, UA negative -Follow MRI Pending , TSH;2.6 , B 12;573 , ABG: Hypoxia. , chest x ray. Hypoventilation.  -EEG negative He is more awake and alert, still not oriented to placed and situation   Acute hypoxic respiratory failure, could be related to hypoventilation but also undiagnosed sleep apnea. Keep on 2 L of oxygen Chest x-ray showed hypoventilation   Bipolar 1 disorder Major depressive disorder GAD -Patient was cleared by psych initially while he was in the ED 10/22 - Continue benztropine , duloxetine , Depakote  and hydroxyzine  10/31: Psych consulted to assist adjusting medications.  Depakote  dose increased  Generalized weakness History of fall Chronic back pain -PT OT consulted  Dyskinesis Tremors -Continue with propranolol  and valbenazine   Diabetes type 2: With hypoglycemia Hold glipizide Hold Metformin .    Hypertension: Continue with lisinopril  Monitor renal function  Hyperlipidemia: Continue with statins.   Vitamin D  deficiency: resume vitamin d    Hemoconcentration, dehydration: Improved with IV fluids  Estimated body mass index is 27.65 kg/m as calculated from the following:   Height as of this encounter: 5' 10 (1.778 m).   Weight as of this encounter: 87.4 kg.   DVT prophylaxis: Lovenox  Code Status: Full code Family Communication: Disposition Plan:  Status is: Inpatient Remains inpatient appropriate because: management of encephalopathy     Consultants:  Psych   Procedures:  None  Antimicrobials:    Subjective: He is more awake.  He was able to tell me his name and age.  Couldn't tell me he was at the hospital.   Objective: Vitals:   03/09/24 1736 03/09/24 2120 03/10/24 0556 03/10/24 1011  BP: (!) 166/130 (!) 125/97 (!) 160/119 (!) 108/95  Pulse: 70 66 65 95  Resp:  20 19   Temp:  98.1 F (36.7 C) 98 F (36.7 C)   TempSrc:  Oral    SpO2:  97% 95%   Weight:      Height:         Intake/Output Summary (Last 24 hours) at 03/10/2024 1300 Last data filed at 03/10/2024 0900 Gross per 24 hour  Intake 2855.52 ml  Output 300 ml  Net 2555.52 ml   Filed Weights   03/07/24 2229  Weight: 87.4 kg    Examination:  General exam: NAD Respiratory system: CTA Cardiovascular system: S 1, S 2 RRR Gastrointestinal system: BS present, soft, nt Central nervous system: wake up says few words, not oriented.  Extremities: no edema   Data Reviewed: I have personally reviewed following labs and imaging studies  CBC: Recent Labs  Lab 03/07/24 1535 03/08/24 0508 03/09/24 0821  WBC 7.9 8.5 6.4  NEUTROABS 4.2  --   --   HGB 17.7* 17.7* 15.9  HCT 53.4* 55.1* 47.9  MCV 85.9 87.0 86.0  PLT 316 333 260   Basic Metabolic Panel: Recent Labs  Lab 03/07/24 1535 03/08/24 0508 03/09/24 0821  NA 137 136 138  K 4.5 4.6 4.2  CL 97* 98 101  CO2 28 22 25   GLUCOSE 99 60* 82  BUN 21* 22* 19  CREATININE 1.12 1.13 0.92  CALCIUM  10.2 10.3 9.6   GFR: Estimated Creatinine Clearance: 89.3 mL/min (by C-G formula based on SCr of 0.92 mg/dL). Liver Function Tests: Recent Labs  Lab 03/07/24 1535  AST 29  ALT 23  ALKPHOS 100  BILITOT 0.8  PROT 8.2*  ALBUMIN 4.6   No results for input(s): LIPASE, AMYLASE in the last 168 hours. Recent Labs  Lab 03/07/24 1535  AMMONIA <13   Coagulation Profile: No results for input(s): INR, PROTIME in the last 168 hours. Cardiac Enzymes: No results for input(s): CKTOTAL, CKMB, CKMBINDEX, TROPONINI in the last 168 hours. BNP (last 3 results) No results for input(s): PROBNP in the last 8760 hours. HbA1C: No results for input(s): HGBA1C in the last 72 hours. CBG: Recent Labs  Lab 03/09/24 1132 03/09/24 1617 03/09/24 2113 03/10/24 0929 03/10/24 1257  GLUCAP 118* 121* 102* 171* 133*   Lipid Profile: No results for input(s): CHOL, HDL, LDLCALC, TRIG, CHOLHDL, LDLDIRECT in the last 72 hours. Thyroid   Function Tests: Recent Labs    03/08/24 0508  TSH 2.640   Anemia Panel: Recent Labs    03/08/24 0508  VITAMINB12 573   Sepsis Labs: No results for input(s): PROCALCITON, LATICACIDVEN in the last 168 hours.  No results found for this or any previous visit (from the past 240 hours).       Radiology Studies: EEG adult Result Date: 03/10/2024 Shelton Arlin KIDD, MD     03/10/2024  9:16 AM Patient Name: [name] [name] MRN: [id number] Epilepsy Attending: Arlin KIDD Shelton Referring Physician/Provider: Madelyne Owen LABOR, MD Date: 03/09/2024 Duration: 23.15 mins Patient history: 58yo male with ams. EEG to evaluate for seizure Level of alertness: Awake AEDs during EEG study: VPA, Valium  Technical aspects: This EEG study was done with scalp electrodes positioned according to the 10-20 International system of electrode placement. Electrical activity was reviewed with band pass filter of 1-70Hz , sensitivity of 7 uV/mm, display speed of 20mm/sec with a 60Hz  notched filter applied as appropriate. EEG data were recorded continuously and digitally stored.  Video  monitoring was available and reviewed as appropriate. Description: EEG showed continuous generalized 3 to 6 Hz theta-delta slowing. Hyperventilation and photic stimulation were not performed.   ABNORMALITY - Continuous slow, generalized IMPRESSION: This study is suggestive of generalized cerebra dysfunction (encephalopathy). No seizures or epileptiform discharges were seen throughout the recording. Priyanka O Yadav        Scheduled Meds:  atorvastatin   20 mg Oral Daily   benztropine   1 mg Oral BID   diazepam   5 mg Oral Once   divalproex   750 mg Oral BID   DULoxetine   40 mg Oral BID   enoxaparin  (LOVENOX ) injection  40 mg Subcutaneous Q24H   hydrOXYzine   50 mg Oral QHS   insulin  aspart  0-6 Units Subcutaneous TID WC   lisinopril   20 mg Oral Daily   propranolol   10 mg Oral TID   valbenazine   80 mg Oral Daily   Vitamin D   (Ergocalciferol )  50,000 Units Oral Q7 days   Continuous Infusions:  lactated ringers  100 mL/hr at 03/10/24 0917   thiamine (VITAMIN B1) injection 500 mg (03/10/24 1000)     LOS: 3 days    Time spent: 35 Minutes    Evania Lyne A Vianka Ertel, MD [facility]   If 7PM-7AM, please contact night-coverage [email or web address]  03/10/2024, 1:00 PM

## 2024-03-10 NOTE — Plan of Care (Signed)
   Problem: Coping: Goal: Level of anxiety will decrease Outcome: Progressing   Problem: Pain Managment: Goal: General experience of comfort will improve and/or be controlled Outcome: Progressing

## 2024-03-11 ENCOUNTER — Inpatient Hospital Stay (HOSPITAL_COMMUNITY)

## 2024-03-11 DIAGNOSIS — M545 Low back pain, unspecified: Secondary | ICD-10-CM | POA: Diagnosis not present

## 2024-03-11 DIAGNOSIS — F319 Bipolar disorder, unspecified: Secondary | ICD-10-CM | POA: Diagnosis not present

## 2024-03-11 DIAGNOSIS — R410 Disorientation, unspecified: Secondary | ICD-10-CM | POA: Diagnosis not present

## 2024-03-11 DIAGNOSIS — G9340 Encephalopathy, unspecified: Secondary | ICD-10-CM | POA: Diagnosis not present

## 2024-03-11 LAB — GLUCOSE, CAPILLARY
Glucose-Capillary: 87 mg/dL (ref 70–99)
Glucose-Capillary: 186 mg/dL — ABNORMAL HIGH (ref 70–99)
Glucose-Capillary: 87 mg/dL (ref 70–99)
Glucose-Capillary: 95 mg/dL (ref 70–99)

## 2024-03-11 MED ORDER — TRAZODONE HCL 50 MG PO TABS
50.0000 mg | ORAL_TABLET | Freq: Every evening | ORAL | Status: DC | PRN
  Administered 2024-03-11 – 2024-06-05 (×50): 50 mg via ORAL
  Filled 2024-03-11 (×48): qty 1

## 2024-03-11 MED ORDER — LORAZEPAM 2 MG/ML IJ SOLN
1.0000 mg | Freq: Once | INTRAMUSCULAR | Status: AC
  Administered 2024-03-12: 1 mg via INTRAVENOUS
  Filled 2024-03-11: qty 1

## 2024-03-11 NOTE — Evaluation (Signed)
 Physical Therapy Evaluation Patient Details Name: [name] [name] MRN: [id number] DOB: [birth year] Today's Date: 03/11/2024  History of Present Illness  Pt is a 58 y/o male presenting on 10/24 for fall and chronic back pain. 10/19 psychiatry consulted due to suicidal thoughts, cleared on 10/22. Change in mental status due to acute delirium, and admitted from ED with acute metabolic encephalopathy. CT negative, EEG negative, MRI pending, chest x ray with hypoventilation. PMH includes: bipolar disorder type 1, tardive dyskinesia, PTSD, MDD, CAD, DM2, OA, tremors.  Clinical Impression  PTA patient reports using RW for mobility and managing Adls.  He was admitted for above and presents with problem list below.  He currently requires mod assist +2 for bed mobility, min assist +2 for transfers using RW and min to mod assist +2 safety for mobility using RW, functionally needing increased assist in small spaces and with RW management.   He is oriented to self and is able to recall correct hospital after re-oriented, but demonstrates some difficulty with problem solving and following multiple step commands.  Does requires hand over hand for hand placement during transfers and cueing for RW safety in bathroom.  Based on performance today, believe patient will best benefit from continued PT services acutely and after dc at inpatient setting with <3hrs/day to optimize independence and safety with mobility.       If plan is discharge home, recommend the following: A lot of help with walking and/or transfers;A lot of help with bathing/dressing/bathroom;Assistance with cooking/housework;Assist for transportation;Help with stairs or ramp for entrance   Can travel by private vehicle   Yes    Equipment Recommendations None recommended by PT  Recommendations for Other Services       Functional Status Assessment Patient has had a recent decline in their functional status and demonstrates the ability to make  significant improvements in function in a reasonable and predictable amount of time.     Precautions / Restrictions Precautions Precautions: Fall Recall of Precautions/Restrictions: Impaired Restrictions Weight Bearing Restrictions Per Provider Order: No      Mobility  Bed Mobility Overal bed mobility: Needs Assistance Bed Mobility: Supine to Sit     Supine to sit: Mod assist, +2 for physical assistance     General bed mobility comments: pt able to initate legs towards EOB, but needing overall mod assist +2 to elevate trunk and scoot hips to EOB using pad.  Tends to pull legs back into bed and needs assist to continue to progress forward to EOB.    Transfers Overall transfer level: Needs assistance Equipment used: Rolling walker (2 wheels) Transfers: Sit to/from Stand Sit to Stand: Min assist, +2 physical assistance           General transfer comment: min assist +2 to fully power up and steady from EOB, noted posterior lean.  He does stand with less assist from [facility], using bil UE support on rails.    Ambulation/Gait Ambulation/Gait assistance: Min assist, Mod assist, +2 physical assistance Gait Distance (Feet): 180 Feet (and 15' back from bathroom) Assistive device: Rolling walker (2 wheels) Gait Pattern/deviations: Step-through pattern, Trunk flexed, Decreased stride length, Decreased step length - right, Decreased step length - left, Shuffle, Drifts right/left Gait velocity: reduced     General Gait Details: Pt ambulates slowly with a flexed posture and shuffling gait pattern. He tends to laterally flex at his trunk to his L but veer to his R, needing minA for balance and RW management. VCs provided to increase step  length and feet clearance.  Increased confusion and unsafe behaviors noted in close quarters of bathroom with pt letting go of RW and becoming increasingly unstable.  Stairs            Wheelchair Mobility     Tilt Bed    Modified Rankin (Stroke  Patients Only)       Balance Overall balance assessment: Needs assistance Sitting-balance support: No upper extremity supported, Feet supported Sitting balance-Leahy Scale: Fair Sitting balance - Comments: statically min guard for safety   Standing balance support: Bilateral upper extremity supported, During functional activity, Single extremity supported Standing balance-Leahy Scale: Poor Standing balance comment: relies on RW support, posterior lean needing external assist                             Pertinent Vitals/Pain Pain Assessment Pain Assessment: No/denies pain Pain Intervention(s): Monitored during session    Home Living Family/patient expects to be discharged to:: Unsure                   Additional Comments: per chart, pt was staying with a friend but the friend does not want him back due to mental health changes    Prior Function Prior Level of Function : Independent/Modified Independent             Mobility Comments: reports using RW most of the time ADLs Comments: managing ADLs     Extremity/Trunk Assessment   Upper Extremity Assessment Upper Extremity Assessment: Right hand dominant RUE Deficits / Details: ROM WFL, decreased coordination RUE Coordination: decreased fine motor LUE Deficits / Details: ROM WFL, decreased coordination LUE Coordination: decreased fine motor    Lower Extremity Assessment Lower Extremity Assessment: Generalized weakness;RLE deficits/detail;LLE deficits/detail RLE Deficits / Details: denied numbness/tingling currently; MMT scores of 4- hip flexion, 4- knee extension, 4+ ankle dorsiflexion RLE Coordination: decreased fine motor LLE Deficits / Details: denied numbness/tingling currently; MMT scores of 4- hip flexion, 4- knee extension, 4+ ankle dorsiflexion LLE Coordination: decreased fine motor    Cervical / Trunk Assessment Cervical / Trunk Assessment: Normal  Communication    Communication Communication: Impaired Factors Affecting Communication: Reduced clarity of speech    Cognition Arousal: Alert Behavior During Therapy: Flat affect   PT - Cognitive impairments: No family/caregiver present to determine baseline, Orientation, Awareness, Problem solving                         Following commands: Impaired Following commands impaired: Follows one step commands with increased time, Follows multi-step commands inconsistently     Cueing Cueing Techniques: Verbal cues, Tactile cues, Gestural cues, Visual cues     General Comments      Exercises     Assessment/Plan    PT Assessment Patient needs continued PT services  PT Problem List Decreased balance;Decreased mobility;Decreased knowledge of use of DME;Decreased strength;Decreased activity tolerance;Decreased safety awareness;Decreased cognition       PT Treatment Interventions DME instruction;Gait training;Stair training;Functional mobility training;Therapeutic activities;Therapeutic exercise;Balance training;Neuromuscular re-education;Cognitive remediation;Patient/family education    PT Goals (Current goals can be found in the Care Plan section)  Acute Rehab PT Goals Patient Stated Goal: Regain IND PT Goal Formulation: With patient Time For Goal Achievement: 03/14/24 Potential to Achieve Goals: Fair    Frequency Min 2X/week     Co-evaluation PT/OT/SLP Co-Evaluation/Treatment: Yes Reason for Co-Treatment: For patient/therapist safety;To address functional/ADL transfers;Necessary to address cognition/behavior during functional activity  PT goals addressed during session: Mobility/safety with mobility OT goals addressed during session: ADL's and self-care       AM-PAC PT 6 Clicks Mobility  Outcome Measure Help needed turning from your back to your side while in a flat bed without using bedrails?: A Little Help needed moving from lying on your back to sitting on the side of a flat  bed without using bedrails?: A Lot Help needed moving to and from a bed to a chair (including a wheelchair)?: A Lot Help needed standing up from a chair using your arms (e.g., wheelchair or bedside chair)?: A Lot Help needed to walk in hospital room?: A Little Help needed climbing 3-5 steps with a railing? : Total 6 Click Score: 13    End of Session Equipment Utilized During Treatment: Gait belt Activity Tolerance: Patient tolerated treatment well Patient left: in chair;with call bell/phone within reach;with chair alarm set;with nursing/sitter in room Nurse Communication: Mobility status PT Visit Diagnosis: Difficulty in walking, not elsewhere classified (R26.2);History of falling (Z91.81);Unsteadiness on feet (R26.81);Other abnormalities of gait and mobility (R26.89);Muscle weakness (generalized) (M62.81);Repeated falls (R29.6)    Time: 9174-9149 PT Time Calculation (min) (ACUTE ONLY): 25 min   Charges:   PT Evaluation $PT Eval Moderate Complexity: 1 Mod   PT General Charges $$ ACUTE PT VISIT: 1 Visit         [facility] PT Acute Rehabilitation Services Office [phone]   Abbott Jasinski 03/11/2024, 10:52 AM

## 2024-03-11 NOTE — Consult Note (Signed)
 [name]  Patient Name: .[name] [name]  MRN: [id number]  DOB: Jan 26, 1966  Consult Order details:  Orders (From admission, onward)     Start     Ordered   03/08/24 0958  IP CONSULT TO PSYCHIATRY       Ordering Provider: Madelyne Owen LABOR, MD  Provider:  (Not yet assigned)  Question Answer Comment  Location [facility]   Reason for Consult? adjustment of medications , admitted with delirium      03/08/24 0957             Mode of Visit: In person    Psychiatry Consult Evaluation  Service Date: March 11, 2024 LOS:  LOS: 4 days  Chief Complaint Delirium  Primary Psychiatric Diagnoses  Bipolar I Disorder 2.  Delirium  Assessment  [name] [name] is a 58 y.o. male admitted: Medicallyfor 03/02/2024  3:27 PM for Acute Encephalopathy. He carries the psychiatric diagnoses of Bipolar I Disorder.  On initial examination, patient can be seen laying in bed with sitter at bedside. The interview is complicated by the patient's lethargy and being in and out of consciousness. He was oriented to person, place, and time but disoriented to situation. He was unable to report why he was currently in the hospital. Per the sitter at bedside she was informed that the patient had a history of aggression but he had behaved well this morning. He was eating fine and the plan was to make sure that he remained awake during the day shift. The patient denied any SI/HI/AVH or paranoid thoughts about the staff. He stated that he was unsure where he would be able to go once he was discharged from the hospital. Please see plan below for detailed recommendations.   03/10/2024: Patient seen face to face in his hospital room. He is awake and alert but appears confused and disoriented to time, place, person and situation. He could not identify any familiar objects including pen and wrist watch. When asked how he's doing today, patient could not participate in any  meaningful conversation. Collateral information from the 1:1 staff at bedside indicates patient was irritated earlier today requiring soft restraints but denies aggression or agitation towards staff. We will continue to monitor patient for improvement.    03/11/2024: The client was calm since yesterday am per the sitter at the bedside.  He was sitting in his recliner eating breakfast in no distress, reported feeling pretty good.  He does hear and see his dog at times which is comforting to him, not disturbing him.  Appetite is good.  Last night, he barely slept per the sitter.   PRN sleep medication added.  Diagnoses:  Active Hospital problems: Principal Problem:   Acute encephalopathy Active Problems:   Chronic low back pain without sciatica   Acute delirium   Generalized weakness   Bipolar 1 disorder (HCC)    Plan   ## Psychiatric Medication Recommendations:  -Continue Cymbalta  40 mg PO BID -Continue Depakote  750 mg PO BID -Continue Hydroxyzine  50 mg at bedtime for sleep.  -Please avoid Valium  or any other Benzodiazepine due to risk of disinhibition and worsening confusion  -Give Haldol 5 mg PO/IM Q 8 HR PRN for acute agitation -Added Trazodone 50 mg at bedtime PRN sleep  ## Medical Decision Making Capacity: Not specifically addressed in this encounter  EKG on 10/24 of 474  ## Further Work-up:  -- Per primary team -- Pertinent labwork reviewed earlier this admission  includes:  Recent Results (from the past 2160 hours)  Urinalysis, Routine w reflex microscopic -Urine, Clean Catch     Status: None   Collection Time: 02/19/24  7:48 AM  Result Value Ref Range   Color, Urine YELLOW YELLOW   APPearance CLEAR CLEAR   Specific Gravity, Urine 1.018 1.005 - 1.030   pH 6.0 5.0 - 8.0   Glucose, UA NEGATIVE NEGATIVE mg/dL   Hgb urine dipstick NEGATIVE NEGATIVE   Bilirubin Urine NEGATIVE NEGATIVE   Ketones, ur NEGATIVE NEGATIVE mg/dL   Protein, ur NEGATIVE NEGATIVE mg/dL    Nitrite NEGATIVE NEGATIVE   Leukocytes,Ua NEGATIVE NEGATIVE    Comment: Performed at [facility], [address], [place], [place] [address]  CBC with Differential/Platelet     Status: None   Collection Time: 02/19/24  8:24 AM  Result Value Ref Range   WBC 8.4 4.0 - 10.5 K/uL   RBC 5.03 4.22 - 5.81 MIL/uL   Hemoglobin 14.2 13.0 - 17.0 g/dL   HCT 57.1 60.9 - 47.9 %   MCV 85.1 80.0 - 100.0 fL   MCH 28.2 26.0 - 34.0 pg   MCHC 33.2 30.0 - 36.0 g/dL   RDW 85.9 88.4 - 84.4 %   Platelets 217 150 - 400 K/uL   nRBC 0.0 0.0 - 0.2 %   Neutrophils Relative % 54 %   Neutro Abs 4.5 1.7 - 7.7 K/uL   Lymphocytes Relative 33 %   Lymphs Abs 2.8 0.7 - 4.0 K/uL   Monocytes Relative 11 %   Monocytes Absolute 0.9 0.1 - 1.0 K/uL   Eosinophils Relative 1 %   Eosinophils Absolute 0.0 0.0 - 0.5 K/uL   Basophils Relative 1 %   Basophils Absolute 0.1 0.0 - 0.1 K/uL   Immature Granulocytes 0 %   Abs Immature Granulocytes 0.03 0.00 - 0.07 K/uL    Comment: Performed at [facility], [address], [place], [place] [address]  Comprehensive metabolic panel with GFR     Status: Abnormal   Collection Time: 02/19/24  8:24 AM  Result Value Ref Range   Sodium 136 135 - 145 mmol/L   Potassium 4.1 3.5 - 5.1 mmol/L   Chloride 99 98 - 111 mmol/L   CO2 26 22 - 32 mmol/L   Glucose, Bld 122 (H) 70 - 99 mg/dL    Comment: Glucose reference range applies only to samples taken after fasting for at least 8 hours.   BUN 19 6 - 20 mg/dL   Creatinine, Ser 8.95 0.61 - 1.24 mg/dL   Calcium  9.8 8.9 - 10.3 mg/dL   Total Protein 6.9 6.5 - 8.1 g/dL   Albumin 4.2 3.5 - 5.0 g/dL   AST 17 15 - 41 U/L   ALT 10 0 - 44 U/L   Alkaline Phosphatase 49 38 - 126 U/L   Total Bilirubin 0.5 0.0 - 1.2 mg/dL   GFR, Estimated >39 >39 mL/min    Comment: (NOTE) Calculated using the CKD-EPI Creatinine Equation (2021)    Anion gap 11 5 - 15    Comment: Performed at [facility], [address] [place], [place] [address]  Comprehensive metabolic panel     Status: Abnormal   Collection Time: 02/25/24  7:59 PM  Result Value Ref Range   Sodium 135 135 - 145 mmol/L   Potassium 4.2 3.5 - 5.1 mmol/L   Chloride 99 98 - 111 mmol/L   CO2 23 22 - 32  mmol/L   Glucose, Bld 106 (H) 70 - 99 mg/dL    Comment: Glucose reference range applies only to samples taken after fasting for at least 8 hours.   BUN 17 6 - 20 mg/dL   Creatinine, Ser 8.80 0.61 - 1.24 mg/dL   Calcium  9.8 8.9 - 10.3 mg/dL   Total Protein 7.3 6.5 - 8.1 g/dL   Albumin 3.9 3.5 - 5.0 g/dL   AST 18 15 - 41 U/L   ALT 17 0 - 44 U/L   Alkaline Phosphatase 57 38 - 126 U/L   Total Bilirubin 1.2 0.0 - 1.2 mg/dL   GFR, Estimated >39 >39 mL/min    Comment: (NOTE) Calculated using the CKD-EPI Creatinine Equation (2021)    Anion gap 13 5 - 15    Comment: Performed at [facility], [address] [place], [place] [address]  CBC     Status: None   Collection Time: 02/25/24  7:59 PM  Result Value Ref Range   WBC 9.7 4.0 - 10.5 K/uL   RBC 5.73 4.22 - 5.81 MIL/uL   Hemoglobin 16.4 13.0 - 17.0 g/dL   HCT 51.6 60.9 - 47.9 %   MCV 84.3 80.0 - 100.0 fL   MCH 28.6 26.0 - 34.0 pg   MCHC 34.0 30.0 - 36.0 g/dL   RDW 85.9 88.4 - 84.4 %   Platelets 288 150 - 400 K/uL   nRBC 0.0 0.0 - 0.2 %    Comment: Performed at [facility], [address] [place], [place] [address]  CBG monitoring, ED     Status: Abnormal   Collection Time: 02/25/24  8:01 PM  Result Value Ref Range   Glucose-Capillary 102 (H) 70 - 99 mg/dL    Comment: Glucose reference range applies only to samples taken after fasting for at least 8 hours.  Ammonia     Status: None   Collection Time: 02/25/24  8:33 PM  Result Value Ref Range   Ammonia 34 9 - 35 umol/L    Comment: HEMOLYSIS AT THIS LEVEL MAY AFFECT RESULT Performed at [facility], [address] [place], [place] [address]   Resp panel by RT-PCR (RSV, Flu A&B, Covid) Anterior Nasal  Swab     Status: None   Collection Time: 02/25/24  8:45 PM   Specimen: Anterior Nasal Swab  Result Value Ref Range   SARS Coronavirus 2 by RT PCR NEGATIVE NEGATIVE   Influenza A by PCR NEGATIVE NEGATIVE   Influenza B by PCR NEGATIVE NEGATIVE    Comment: (NOTE) The Xpert Xpress SARS-CoV-2/FLU/RSV plus assay is intended as an aid in the diagnosis of influenza from Nasopharyngeal swab specimens and should not be used as a sole basis for treatment. Nasal washings and aspirates are unacceptable for Xpert Xpress SARS-CoV-2/FLU/RSV testing.  Fact Sheet for Patients: bloggercourse.com  Fact Sheet for Healthcare Providers: seriousbroker.it  This test is not yet approved or cleared by the [place]  FDA and has been authorized for detection and/or diagnosis of SARS-CoV-2 by FDA under an Emergency Use Authorization (EUA). This EUA will remain in effect (meaning this test can be used) for the duration of the COVID-19 declaration under Section 564(b)(1) of the Act, 21 U.S.C. section 360bbb-3(b)(1), unless the authorization is terminated or revoked.     Resp Syncytial Virus by PCR NEGATIVE NEGATIVE    Comment: (NOTE) Fact Sheet for Patients: bloggercourse.com  Fact Sheet for Healthcare Providers: seriousbroker.it  This test is not yet  approved or cleared by the [place]  FDA and has been authorized for detection and/or diagnosis of SARS-CoV-2 by FDA under an Emergency Use Authorization (EUA). This EUA will remain in effect (meaning this test can be used) for the duration of the COVID-19 declaration under Section 564(b)(1) of the Act, 21 U.S.C. section 360bbb-3(b)(1), unless the authorization is terminated or revoked.  Performed at [facility], [address] [place], [place] [address]   Ethanol     Status: None   Collection Time: 02/25/24  9:00 PM  Result Value Ref  Range   Alcohol, Ethyl (B) <15 <15 mg/dL    Comment: (NOTE) For medical purposes only. Performed at [facility], [address] [place], [place] [address]   Urinalysis, Routine w reflex microscopic -Urine, Clean Catch     Status: Abnormal   Collection Time: 02/25/24 11:30 PM  Result Value Ref Range   Color, Urine YELLOW YELLOW   APPearance CLEAR CLEAR   Specific Gravity, Urine 1.018 1.005 - 1.030   pH 5.0 5.0 - 8.0   Glucose, UA NEGATIVE NEGATIVE mg/dL   Hgb urine dipstick NEGATIVE NEGATIVE   Bilirubin Urine NEGATIVE NEGATIVE   Ketones, ur 5 (A) NEGATIVE mg/dL   Protein, ur NEGATIVE NEGATIVE mg/dL   Nitrite NEGATIVE NEGATIVE   Leukocytes,Ua NEGATIVE NEGATIVE    Comment: Performed at [facility], [address] [place], [place] [address]  Rapid urine drug screen (hospital performed)     Status: None   Collection Time: 02/25/24 11:30 PM  Result Value Ref Range   Opiates NONE DETECTED NONE DETECTED   Cocaine NONE DETECTED NONE DETECTED   Benzodiazepines NONE DETECTED NONE DETECTED   Amphetamines NONE DETECTED NONE DETECTED   Tetrahydrocannabinol NONE DETECTED NONE DETECTED   Barbiturates NONE DETECTED NONE DETECTED    Comment: (NOTE) DRUG SCREEN FOR MEDICAL PURPOSES ONLY.  IF CONFIRMATION IS NEEDED FOR ANY PURPOSE, NOTIFY LAB WITHIN 5 DAYS.  LOWEST DETECTABLE LIMITS FOR URINE DRUG SCREEN Drug Class                     Cutoff (ng/mL) Amphetamine and metabolites    1000 Barbiturate and metabolites    200 Benzodiazepine                 200 Opiates and metabolites        300 Cocaine and metabolites        300 THC                            50 Performed at [facility], [address] [place], [place] [address]   Basic metabolic panel     Status: Abnormal   Collection Time: 03/02/24  5:33 PM  Result Value Ref Range   Sodium 135 135 - 145 mmol/L   Potassium 4.5 3.5 - 5.1 mmol/L   Chloride 97 (L) 98 - 111 mmol/L   CO2 28 22 - 32 mmol/L   Glucose,  Bld 98 70 - 99 mg/dL    Comment: Glucose reference range applies only to samples taken after fasting for at least 8 hours.   BUN 18 6 - 20 mg/dL   Creatinine, Ser 8.86 0.61 - 1.24 mg/dL   Calcium  10.0 8.9 - 10.3 mg/dL   GFR, Estimated >39 >39 mL/min    Comment: (NOTE) Calculated using the CKD-EPI Creatinine Equation (2021)    Anion gap  11 5 - 15    Comment: Performed at [facility], [address], [place], [place] [address]  CBC with Differential     Status: Abnormal   Collection Time: 03/02/24  5:33 PM  Result Value Ref Range   WBC 8.7 4.0 - 10.5 K/uL   RBC 6.05 (H) 4.22 - 5.81 MIL/uL   Hemoglobin 17.1 (H) 13.0 - 17.0 g/dL   HCT 48.0 60.9 - 47.9 %   MCV 85.8 80.0 - 100.0 fL   MCH 28.3 26.0 - 34.0 pg   MCHC 32.9 30.0 - 36.0 g/dL   RDW 86.2 88.4 - 84.4 %   Platelets 313 150 - 400 K/uL   nRBC 0.0 0.0 - 0.2 %   Neutrophils Relative % 56 %   Neutro Abs 5.0 1.7 - 7.7 K/uL   Lymphocytes Relative 32 %   Lymphs Abs 2.8 0.7 - 4.0 K/uL   Monocytes Relative 9 %   Monocytes Absolute 0.8 0.1 - 1.0 K/uL   Eosinophils Relative 1 %   Eosinophils Absolute 0.1 0.0 - 0.5 K/uL   Basophils Relative 1 %   Basophils Absolute 0.1 0.0 - 0.1 K/uL   Immature Granulocytes 1 %   Abs Immature Granulocytes 0.04 0.00 - 0.07 K/uL    Comment: Performed at [facility], [address] [place], [place] [address]  CBG monitoring, ED     Status: Abnormal   Collection Time: 03/03/24  9:06 AM  Result Value Ref Range   Glucose-Capillary 113 (H) 70 - 99 mg/dL    Comment: Glucose reference range applies only to samples taken after fasting for at least 8 hours.  Ammonia     Status: None   Collection Time: 03/07/24  3:35 PM  Result Value Ref Range   Ammonia <13 9 - 35 umol/L    Comment: Performed at [facility], [address] [place], [place] [address]  CBC with Differential     Status: Abnormal   Collection Time: 03/07/24  3:35 PM  Result Value Ref  Range   WBC 7.9 4.0 - 10.5 K/uL   RBC 6.22 (H) 4.22 - 5.81 MIL/uL   Hemoglobin 17.7 (H) 13.0 - 17.0 g/dL   HCT 46.5 (H) 60.9 - 47.9 %   MCV 85.9 80.0 - 100.0 fL   MCH 28.5 26.0 - 34.0 pg   MCHC 33.1 30.0 - 36.0 g/dL   RDW 86.2 88.4 - 84.4 %   Platelets 316 150 - 400 K/uL   nRBC 0.0 0.0 - 0.2 %   Neutrophils Relative % 53 %   Neutro Abs 4.2 1.7 - 7.7 K/uL   Lymphocytes Relative 34 %   Lymphs Abs 2.7 0.7 - 4.0 K/uL   Monocytes Relative 10 %   Monocytes Absolute 0.8 0.1 - 1.0 K/uL   Eosinophils Relative 2 %   Eosinophils Absolute 0.1 0.0 - 0.5 K/uL   Basophils Relative 1 %   Basophils Absolute 0.1 0.0 - 0.1 K/uL   Immature Granulocytes 0 %   Abs Immature Granulocytes 0.02 0.00 - 0.07 K/uL    Comment: Performed at [facility], [address], [place], [place] [address]  Comprehensive metabolic panel     Status: Abnormal   Collection Time: 03/07/24  3:35 PM  Result Value Ref Range   Sodium 137 135 - 145 mmol/L   Potassium 4.5 3.5 - 5.1 mmol/L   Chloride 97 (L) 98 - 111 mmol/L   CO2 28  22 - 32 mmol/L   Glucose, Bld 99 70 - 99 mg/dL    Comment: Glucose reference range applies only to samples taken after fasting for at least 8 hours.   BUN 21 (H) 6 - 20 mg/dL   Creatinine, Ser 8.87 0.61 - 1.24 mg/dL   Calcium  10.2 8.9 - 10.3 mg/dL   Total Protein 8.2 (H) 6.5 - 8.1 g/dL   Albumin 4.6 3.5 - 5.0 g/dL   AST 29 15 - 41 U/L   ALT 23 0 - 44 U/L   Alkaline Phosphatase 100 38 - 126 U/L   Total Bilirubin 0.8 0.0 - 1.2 mg/dL   GFR, Estimated >39 >39 mL/min    Comment: (NOTE) Calculated using the CKD-EPI Creatinine Equation (2021)    Anion gap 12 5 - 15    Comment: Performed at [facility], [address], [place], [place] [address]  Urinalysis, w/ Reflex to Culture (Infection Suspected) -Urine, Clean Catch     Status: Abnormal   Collection Time: 03/07/24  8:22 PM  Result Value Ref Range   Specimen Source URINE, CLEAN CATCH    Color, Urine  YELLOW YELLOW   APPearance CLEAR CLEAR   Specific Gravity, Urine 1.024 1.005 - 1.030   pH 6.0 5.0 - 8.0   Glucose, UA NEGATIVE NEGATIVE mg/dL   Hgb urine dipstick NEGATIVE NEGATIVE   Bilirubin Urine NEGATIVE NEGATIVE   Ketones, ur 5 (A) NEGATIVE mg/dL   Protein, ur NEGATIVE NEGATIVE mg/dL   Nitrite NEGATIVE NEGATIVE   Leukocytes,Ua TRACE (A) NEGATIVE   RBC / HPF 0-5 0 - 5 RBC/hpf   WBC, UA 0-5 0 - 5 WBC/hpf    Comment:        Reflex urine culture not performed if WBC <=10, OR if Squamous epithelial cells >5. If Squamous epithelial cells >5 suggest recollection.    Bacteria, UA RARE (A) NONE SEEN   Squamous Epithelial / HPF 0-5 0 - 5 /HPF   Mucus PRESENT     Comment: Performed at [facility], [address], [place], [place] [address]  HIV Antibody (routine testing w rflx)     Status: None   Collection Time: 03/08/24  5:08 AM  Result Value Ref Range   HIV Screen 4th Generation wRfx Non Reactive Non Reactive    Comment: Performed at [facility], [address], [place], [place] [address]  Basic metabolic panel     Status: Abnormal   Collection Time: 03/08/24  5:08 AM  Result Value Ref Range   Sodium 136 135 - 145 mmol/L   Potassium 4.6 3.5 - 5.1 mmol/L   Chloride 98 98 - 111 mmol/L   CO2 22 22 - 32 mmol/L   Glucose, Bld 60 (L) 70 - 99 mg/dL    Comment: Glucose reference range applies only to samples taken after fasting for at least 8 hours.   BUN 22 (H) 6 - 20 mg/dL   Creatinine, Ser 8.86 0.61 - 1.24 mg/dL   Calcium  10.3 8.9 - 10.3 mg/dL   GFR, Estimated >39 >39 mL/min    Comment: (NOTE) Calculated using the CKD-EPI Creatinine Equation (2021)    Anion gap 17 (H) 5 - 15    Comment: Performed at [facility], [address], [place], [place] [address]  CBC     Status: Abnormal   Collection Time: 03/08/24  5:08 AM  Result Value Ref Range   WBC 8.5 4.0 - 10.5 K/uL  RBC 6.33 (H) 4.22 - 5.81 MIL/uL   Hemoglobin 17.7 (H) 13.0 -  17.0 g/dL   HCT 44.8 (H) 60.9 - 47.9 %   MCV 87.0 80.0 - 100.0 fL   MCH 28.0 26.0 - 34.0 pg   MCHC 32.1 30.0 - 36.0 g/dL   RDW 85.9 88.4 - 84.4 %   Platelets 333 150 - 400 K/uL   nRBC 0.0 0.0 - 0.2 %    Comment: Performed at [facility], [address] [place], [place] [address]  Vitamin B12     Status: None   Collection Time: 03/08/24  5:08 AM  Result Value Ref Range   Vitamin B-12 573 180 - 914 pg/mL    Comment: Performed at [facility], [address], [place], [place] [address]  TSH     Status: None   Collection Time: 03/08/24  5:08 AM  Result Value Ref Range   TSH 2.640 0.350 - 4.500 uIU/mL    Comment: Performed at [facility], [address] [place], [place] [address]  Glucose, capillary     Status: None   Collection Time: 03/08/24  7:30 AM  Result Value Ref Range   Glucose-Capillary 95 70 - 99 mg/dL    Comment: Glucose reference range applies only to samples taken after fasting for at least 8 hours.  Blood gas, arterial     Status: Abnormal   Collection Time: 03/08/24 10:15 AM  Result Value Ref Range   FIO2 21 %   pH, Arterial 7.49 (H) 7.35 - 7.45   pCO2 arterial 34 32 - 48 mmHg   pO2, Arterial 65 (L) 83 - 108 mmHg   Bicarbonate 26.1 20.0 - 28.0 mmol/L   Acid-Base Excess 2.8 (H) 0.0 - 2.0 mmol/L   O2 Saturation 95.5 %   Patient temperature 36.7    Collection site RIGHT RADIAL    Drawn by [id number]    Allens test (pass/fail) PASS PASS    Comment: Performed at [facility], [address], [place], [place] [address]  Glucose, capillary     Status: Abnormal   Collection Time: 03/08/24 11:48 AM  Result Value Ref Range   Glucose-Capillary 179 (H) 70 - 99 mg/dL    Comment: Glucose reference range applies only to samples taken after fasting for at least 8 hours.  Valproic acid  level     Status: None   Collection Time: 03/08/24  3:41 PM  Result Value Ref Range   Valproic Acid  Lvl 73 50 - 100  ug/mL    Comment: Performed at [facility], [address] [place], [place] [address]  Glucose, capillary     Status: Abnormal   Collection Time: 03/08/24  4:33 PM  Result Value Ref Range   Glucose-Capillary 104 (H) 70 - 99 mg/dL    Comment: Glucose reference range applies only to samples taken after fasting for at least 8 hours.  Glucose, capillary     Status: None   Collection Time: 03/08/24  8:20 PM  Result Value Ref Range   Glucose-Capillary 93 70 - 99 mg/dL    Comment: Glucose reference range applies only to samples taken after fasting for at least 8 hours.  Glucose, capillary     Status: None   Collection Time: 03/09/24  7:29 AM  Result Value Ref Range   Glucose-Capillary 84 70 - 99 mg/dL    Comment: Glucose reference range applies only to samples taken after fasting for  at least 8 hours.  CBC     Status: None   Collection Time: 03/09/24  8:21 AM  Result Value Ref Range   WBC 6.4 4.0 - 10.5 K/uL   RBC 5.57 4.22 - 5.81 MIL/uL   Hemoglobin 15.9 13.0 - 17.0 g/dL   HCT 52.0 60.9 - 47.9 %   MCV 86.0 80.0 - 100.0 fL   MCH 28.5 26.0 - 34.0 pg   MCHC 33.2 30.0 - 36.0 g/dL   RDW 86.4 88.4 - 84.4 %   Platelets 260 150 - 400 K/uL   nRBC 0.0 0.0 - 0.2 %    Comment: Performed at [facility], [address] [place], [place] [address]  Basic metabolic panel     Status: None   Collection Time: 03/09/24  8:21 AM  Result Value Ref Range   Sodium 138 135 - 145 mmol/L   Potassium 4.2 3.5 - 5.1 mmol/L   Chloride 101 98 - 111 mmol/L   CO2 25 22 - 32 mmol/L   Glucose, Bld 82 70 - 99 mg/dL    Comment: Glucose reference range applies only to samples taken after fasting for at least 8 hours.   BUN 19 6 - 20 mg/dL   Creatinine, Ser 9.07 0.61 - 1.24 mg/dL   Calcium  9.6 8.9 - 10.3 mg/dL   GFR, Estimated >39 >39 mL/min    Comment: (NOTE) Calculated using the CKD-EPI Creatinine Equation (2021)    Anion gap 12 5 - 15    Comment: Performed at [facility], [address] [place], [place] [address]  Glucose, capillary     Status: Abnormal   Collection Time: 03/09/24 11:32 AM  Result Value Ref Range   Glucose-Capillary 118 (H) 70 - 99 mg/dL    Comment: Glucose reference range applies only to samples taken after fasting for at least 8 hours.  Glucose, capillary     Status: Abnormal   Collection Time: 03/09/24  4:17 PM  Result Value Ref Range   Glucose-Capillary 121 (H) 70 - 99 mg/dL    Comment: Glucose reference range applies only to samples taken after fasting for at least 8 hours.  Glucose, capillary     Status: Abnormal   Collection Time: 03/09/24  9:13 PM  Result Value Ref Range   Glucose-Capillary 102 (H) 70 - 99 mg/dL    Comment: Glucose reference range applies only to samples taken after fasting for at least 8 hours.  Glucose, capillary     Status: Abnormal   Collection Time: 03/10/24  9:29 AM  Result Value Ref Range   Glucose-Capillary 171 (H) 70 - 99 mg/dL    Comment: Glucose reference range applies only to samples taken after fasting for at least 8 hours.  Glucose, capillary     Status: Abnormal   Collection Time: 03/10/24 12:57 PM  Result Value Ref Range   Glucose-Capillary 133 (H) 70 - 99 mg/dL    Comment: Glucose reference range applies only to samples taken after fasting for at least 8 hours.  Glucose, capillary     Status: None   Collection Time: 03/10/24  5:11 PM  Result Value Ref Range   Glucose-Capillary 99 70 - 99 mg/dL    Comment: Glucose reference range applies only to samples taken after fasting for at least 8 hours.  Glucose, capillary     Status: Abnormal   Collection Time: 03/10/24 10:01 PM  Result Value Ref Range   Glucose-Capillary 110 (H) 70 -  99 mg/dL    Comment: Glucose reference range applies only to samples taken after fasting for at least 8 hours.  Glucose, capillary     Status: None   Collection Time: 03/11/24  7:23 AM  Result Value Ref Range   Glucose-Capillary 87 70 - 99  mg/dL    Comment: Glucose reference range applies only to samples taken after fasting for at least 8 hours.      ## Disposition:-- There are no psychiatric contraindications to discharge at this time  ## Behavioral / Environmental: -Delirium Precautions: Delirium Interventions for Nursing and Staff: - RN to open blinds every AM. - To Bedside: Glasses, hearing aide, and pt's own shoes. Make available to patients. when possible and encourage use. - Encourage po fluids when appropriate, keep fluids within reach. - OOB to chair with meals. - Passive ROM exercises to all extremities with AM & PM care. - RN to assess orientation to person, time and place QAM and PRN. - Recommend extended visitation hours with familiar family/friends as feasible. - Staff to minimize disturbances at night. Turn off television when pt asleep or when not in use.    ## Safety and Observation Level:  - Based on my clinical evaluation, I estimate the patient to be at minimal risk of self harm in the current setting. - At this time, we recommend  1:1 Observation. This decision is based on my review of the chart including patient's history and current presentation, interview of the patient, mental status examination, and consideration of suicide risk including evaluating suicidal ideation, plan, intent, suicidal or self-harm behaviors, risk factors, and protective factors. This judgment is based on our ability to directly address suicide risk, implement suicide prevention strategies, and develop a safety plan while the patient is in the clinical setting. Please contact our team if there is a concern that risk level has changed.  CSSR Risk Category:C-SSRS RISK CATEGORY: No Risk  Suicide Risk Assessment: Patient has following modifiable risk factors for suicide: medication noncompliance, which we are addressing by currently in the hospital receiving medications but will need safe discharge plan. Patient has following  non-modifiable or demographic risk factors for suicide: male gender Patient has the following protective factors against suicide: NA  Thank you for this consult request. Recommendations have been communicated to the primary team.  We will continue to follow at this time.   Sharlot Becker, NP       History of Present Illness  Patient Report:  The interview is complicated by the patient's lethargy and being in and out of consciousness. He was oriented to person, place, and time but disoriented to situation. He was unable to report why he was currently in the hospital. Per the sitter at bedside she was informed that the patient had a history of aggression but he had behaved well this morning. He was eating fine and the plan was to make sure that he remained awake during the day shift. The patient denied any SI/HI/AVH or paranoid thoughts about the staff. He stated that he was unsure where he would be able to go once he was discharged from the hospital   Collateral information:  Sari Darlis Benne [phone] Called twice and left message  ROS   Psychiatric and Social History  Psychiatric History:  Information collected from Patient  Prev Dx/Sx: Bipolar Disorder Current Psych Provider: [facility] Home Meds (current): Cymbalta  and Depakote  Previous Med Trials: Cymablta, Depakote , Ingrezza  Therapy: [facility]  Prior Psych Hospitalization: Yes  Prior Self Harm:  Denies Prior Violence: Yes  Family Psych History: [facility] Family Hx suicide: [facility]  Social History:  Developmental Hx: Unremarkable Educational Hx: [facility] Occupational Hx: Unemployed Legal Hx: [facility] Living Situation: Homeless Spiritual Hx: [facility] Access to weapons/lethal means: Denies   Substance History Alcohol: Denies  Tobacco: Denies  Illicit drugs: Denies  Prescription drug abuse: Denies  Rehab hx: Denies   Exam Findings  Physical Exam:  Vital Signs:  Temp:  [97.9 F (36.6 C)] 97.9 F (36.6 C) (11/02 0648) Pulse Rate:  [66-95] 66  (11/02 0648) Resp:  [18] 18 (11/02 0648) BP: (108-144)/(78-95) 141/90 (11/02 0648) SpO2:  [93 %] 93 % (11/02 0648) Blood pressure (!) 141/90, pulse 66, temperature 97.9 F (36.6 C), temperature source Oral, resp. rate 18, height 5' 10 (1.778 m), weight 87.4 kg, SpO2 93%. Body mass index is 27.65 kg/m.  Physical Exam  Mental Status Exam: General Appearance: Casual  Orientation:  Person, place, and time disoriented to situation  Memory:  Poor, improving  Concentration:  Fair  Recall:  Poor, improving  Attention  Fair  Eye Contact:  Good  Speech:  WDL  Language:  Fair  Volume:  WDL  Mood: pretty good  Affect:  appropriate  Thought Process:  logical  Thought Content:  AV of his dog at times  Suicidal Thoughts:  No  Homicidal Thoughts:  No  Judgement:  Fair  Insight:  Poor  Psychomotor Activity:  Tremor  Akathisia:  No  Fund of Knowledge:  Estimated to be below average      Assets:  None  Cognition:  Impaired,  Moderate  ADL's:  Impaired  AIMS (if indicated):        Other History   These have been pulled in through the EMR, reviewed, and updated if appropriate.  Family History:  The patient's family history includes Anxiety disorder in his father and mother; Cancer in his paternal grandfather; Depression in his brother; Diabetes in his father; Heart attack in his maternal grandfather; Heart disease in his mother; Heart failure in his paternal grandmother; Kidney failure in his father; Mental illness in his sister; Thyroid  disease in his brother and sister.  Medical History: Past Medical History:  Diagnosis Date   Anxiety    Arthritis    Chest heaviness    Chills    Depression, major    Diabetes mellitus without complication (HCC)    Dizzy    Dyslipidemia    ED (erectile dysfunction)    Elevated CPK    GERD (gastroesophageal reflux disease)    HA (headache)    Hypertension    Hypertriglyceridemia    Insomnia    Kidney stone    Left-sided Bell's palsy     Morbid obesity (HCC)    Nausea    No appetite    Panniculitis    of right lower leg   Parkinson's disease (HCC)    PTSD (post-traumatic stress disorder)    Routine screening for STI (sexually transmitted infection) 01/05/2023   Weakness     Surgical History: Past Surgical History:  Procedure Laterality Date   NO PAST SURGERIES       Medications:   Current Facility-Administered Medications:    acetaminophen  (TYLENOL ) tablet 650 mg, 650 mg, Oral, Q6H PRN, 650 mg at 03/11/24 0808 **OR** acetaminophen  (TYLENOL ) suppository 650 mg, 650 mg, Rectal, Q6H PRN, Lou Claretta HERO, MD   atorvastatin  (LIPITOR) tablet 20 mg, 20 mg, Oral, Daily, Mannie Pac T, DO, 20 mg at 03/10/24 9057   benztropine  (  COGENTIN ) tablet 1 mg, 1 mg, Oral, BID, Mannie Pac T, DO, 1 mg at 03/10/24 2120   bisacodyl (DULCOLAX) EC tablet 5 mg, 5 mg, Oral, Daily PRN, Lou Claretta HERO, MD   divalproex  (DEPAKOTE ) DR tablet 750 mg, 750 mg, Oral, BID, Merilee, Jerrell L, DO, 750 mg at 03/10/24 2120   DULoxetine  (CYMBALTA ) DR capsule 40 mg, 40 mg, Oral, BID, Mannie Pac T, DO, 40 mg at 03/10/24 2120   enoxaparin  (LOVENOX ) injection 40 mg, 40 mg, Subcutaneous, Q24H, Lou Claretta HERO, MD, 40 mg at 03/11/24 0800   haloperidol (HALDOL) tablet 5 mg, 5 mg, Oral, Q8H PRN **OR** haloperidol lactate (HALDOL) injection 5 mg, 5 mg, Intramuscular, Q8H PRN, Akintayo, Musa A, MD, 5 mg at 03/10/24 1427   hydrOXYzine  (ATARAX ) tablet 50 mg, 50 mg, Oral, QHS, Regalado, Belkys A, MD, 50 mg at 03/10/24 2120   insulin  aspart (novoLOG ) injection 0-6 Units, 0-6 Units, Subcutaneous, TID WC, Regalado, Belkys A, MD, 1 Units at 03/10/24 0949   lactated ringers  infusion, , Intravenous, Continuous, Regalado, Belkys A, MD, Last Rate: 100 mL/hr at 03/11/24 0535, New Bag at 03/11/24 0535   lisinopril  (ZESTRIL ) tablet 20 mg, 20 mg, Oral, Daily, Mannie Pac T, DO, 20 mg at 03/09/24 1123   LORazepam (ATIVAN) injection 1 mg, 1 mg,  Intravenous, Once, Regalado, Belkys A, MD   ondansetron (ZOFRAN) tablet 4 mg, 4 mg, Oral, Q6H PRN **OR** ondansetron (ZOFRAN) injection 4 mg, 4 mg, Intravenous, Q6H PRN, Lou, Claretta HERO, MD   propranolol  (INDERAL ) tablet 10 mg, 10 mg, Oral, TID, Mannie Pac T, DO, 10 mg at 03/10/24 2120   senna-docusate (Senokot-S) tablet 1 tablet, 1 tablet, Oral, QHS PRN, Lou Claretta HERO, MD   thiamine (VITAMIN B1) 500 mg in sodium chloride  0.9 % 50 mL IVPB, 500 mg, Intravenous, Daily, Regalado, Belkys A, MD, Last Rate: 110 mL/hr at 03/10/24 1000, 500 mg at 03/10/24 1000   valbenazine  (INGREZZA ) capsule 80 mg, 80 mg, Oral, Daily, Mannie Pac T, DO, 80 mg at 03/10/24 0942   Vitamin D  (Ergocalciferol ) (DRISDOL) 1.25 MG (50000 UNIT) capsule 50,000 Units, 50,000 Units, Oral, Q7 days, Regalado, Belkys A, MD, 50,000 Units at 03/08/24 9042  Allergies: No Known Allergies  Sharlot Becker, NP

## 2024-03-11 NOTE — Evaluation (Signed)
 Occupational Therapy Evaluation Patient Details Name: [name] [name] MRN: [id number] DOB: 30-Dec-1965 Today's Date: 03/11/2024   History of Present Illness   Pt is a 58 y/o male presenting on 10/24 for fall and chronic back pain. 10/19 psychiatry consulted due to suicidal thoughts, cleared on 10/22. Change in mental status due to acute delirium, and admitted from ED with acute metabolic encephalopathy. CT negative, EEG negative, MRI pending, chest x ray with hypoventilation. PMH includes: bipolar disorder type 1, tardive dyskinesia, PTSD, MDD, CAD, DM2, OA, tremors.     Clinical Impressions PTA patient reports using RW for mobility and managing Adls.  He was admitted for above and presents with problem list below.  He currently requires mod assist +2 for bed mobility, min assist +2 for transfers using RW and min to mod assist +2 safety for mobility using RW, functionally needing increased assist in small spaces and with RW management.  Functionally, needing setup to mod assist for ADLs.  He is oriented to self and is able to recall correct hospital after re-oriented, but demonstrates some difficulty with problem solving and following multiple step commands.  Does requires hand over hand for hand placement during transfers and cueing for RW safety in bathroom. Further visual assessment recommended.  Based on performance today, believe patient will best benefit from continued OT services acutely and after dc at inpatient setting with <3hrs/day to optimize independence, safety with ADLs and mobility.      If plan is discharge home, recommend the following:   A lot of help with walking and/or transfers;A lot of help with bathing/dressing/bathroom;Assistance with cooking/housework;Direct supervision/assist for financial management;Direct supervision/assist for medications management;Assist for transportation;Supervision due to cognitive status     Functional Status Assessment   Patient has had a  recent decline in their functional status and demonstrates the ability to make significant improvements in function in a reasonable and predictable amount of time.     Equipment Recommendations   BSC/3in1     Recommendations for Other Services         Precautions/Restrictions   Precautions Precautions: Fall Recall of Precautions/Restrictions: Impaired Restrictions Weight Bearing Restrictions Per Provider Order: No     Mobility Bed Mobility Overal bed mobility: Needs Assistance Bed Mobility: Supine to Sit     Supine to sit: Mod assist, +2 for physical assistance     General bed mobility comments: pt able to initate legs towards EOB, but needing overall mod assist +2 to elevate trunk and scoot hips to EOB using pad.  Tends to pull legs back into bed and needs assist to continue to progress forward to EOB.    Transfers Overall transfer level: Needs assistance Equipment used: Rolling walker (2 wheels) Transfers: Sit to/from Stand Sit to Stand: Min assist, +2 physical assistance           General transfer comment: min assist +2 to fully power up and steady from EOB, noted posterior lean.  He does stand with less assist from [facility], using bil UE support on rails.      Balance Overall balance assessment: Needs assistance Sitting-balance support: No upper extremity supported, Feet supported Sitting balance-Leahy Scale: Fair Sitting balance - Comments: statically min guard for safety   Standing balance support: Bilateral upper extremity supported, During functional activity, Single extremity supported Standing balance-Leahy Scale: Poor Standing balance comment: relies on RW support, posterior lean needing external assist  ADL either performed or assessed with clinical judgement   ADL Overall ADL's : Needs assistance/impaired Eating/Feeding: Set up;Sitting Eating/Feeding Details (indicate cue type and reason): in  recliner Grooming: Minimal assistance;Standing           Upper Body Dressing : Minimal assistance;Sitting   Lower Body Dressing: Moderate assistance;Sit to/from stand   Toilet Transfer: Minimal assistance;+2 for physical assistance;Rolling walker (2 wheels);BSC/3in1;Ambulation Toilet Transfer Details (indicate cue type and reason): 3:1 over toilet         Functional mobility during ADLs: Rolling walker (2 wheels);Minimal assistance;Moderate assistance;+2 for safety/equipment;+2 for physical assistance       Vision Baseline Vision/History: 1 Wears glasses Patient Visual Report: No change from baseline Vision Assessment?: Vision impaired- to be further tested in functional context Additional Comments: difficult to assess, tends to veer to the R and requires visual cueing for awareness of objects and safety. At times requires hand over hand support to locate items, and increased difficutly navigating small spaces.  Further assessment needed.     Perception         Praxis         Pertinent Vitals/Pain Pain Assessment Pain Assessment: No/denies pain Pain Intervention(s): Monitored during session     Extremity/Trunk Assessment Upper Extremity Assessment Upper Extremity Assessment: Right hand dominant;RUE deficits/detail;LUE deficits/detail;Generalized weakness RUE Deficits / Details: ROM WFL, decreased coordination LUE Deficits / Details: ROM WFL, decreased coordination   Lower Extremity Assessment Lower Extremity Assessment: Defer to PT evaluation       Communication Communication Communication: Impaired Factors Affecting Communication: Reduced clarity of speech (soft spoken)   Cognition Arousal: Alert Behavior During Therapy: Flat affect Cognition: Cognition impaired   Orientation impairments: Situation, Place, Time Awareness: Intellectual awareness intact Memory impairment (select all impairments): Short-term memory, Working civil service fast streamer, Administrator, sports Attention impairment (select first level of impairment): Sustained attention Executive functioning impairment (select all impairments): Sequencing, Reasoning, Problem solving, Organization OT - Cognition Comments: pt oriented to self, able to recall hopstial after educated but initally reports [facility] (at [facility]).  He follows simple commands with increased time but demosntrates poor ability to sequence and problem solve.                 Following commands: Impaired Following commands impaired: Follows one step commands with increased time, Follows multi-step commands inconsistently     Cueing  General Comments   Cueing Techniques: Verbal cues;Tactile cues;Gestural cues;Visual cues      Exercises     Shoulder Instructions      Home Living Family/patient expects to be discharged to:: Unsure                                 Additional Comments: per chart, pt was staying with a friend but the friend does not want him back due to mental health changes      Prior Functioning/Environment Prior Level of Function : Independent/Modified Independent             Mobility Comments: reports using RW most of the time ADLs Comments: managing ADLs    OT Problem List: Decreased strength;Decreased activity tolerance;Impaired balance (sitting and/or standing);Decreased coordination;Decreased cognition;Decreased safety awareness;Decreased knowledge of use of DME or AE;Impaired vision/perception;Decreased knowledge of precautions   OT Treatment/Interventions: Self-care/ADL training;Therapeutic exercise;DME and/or AE instruction;Therapeutic activities;Cognitive remediation/compensation;Patient/family education;Visual/perceptual remediation/compensation;Balance training      OT Goals(Current goals can be found in the care plan section)  Acute Rehab OT Goals Patient Stated Goal: get better OT Goal Formulation: With patient Time For Goal Achievement: 03/25/24 Potential to  Achieve Goals: Good   OT Frequency:  Min 2X/week    Co-evaluation PT/OT/SLP Co-Evaluation/Treatment: Yes Reason for Co-Treatment: For patient/therapist safety;To address functional/ADL transfers;Necessary to address cognition/behavior during functional activity   OT goals addressed during session: ADL's and self-care      AM-PAC OT 6 Clicks Daily Activity     Outcome Measure Help from another person eating meals?: A Little Help from another person taking care of personal grooming?: A Little Help from another person toileting, which includes using toliet, bedpan, or urinal?: A Lot Help from another person bathing (including washing, rinsing, drying)?: A Lot Help from another person to put on and taking off regular upper body clothing?: A Little Help from another person to put on and taking off regular lower body clothing?: A Lot 6 Click Score: 15   End of Session Equipment Utilized During Treatment: Gait belt;Rolling walker (2 wheels) Nurse Communication: Mobility status;Precautions  Activity Tolerance: Patient tolerated treatment well;No increased pain Patient left: in chair;with call bell/phone within reach;with chair alarm set;with nursing/sitter in room  OT Visit Diagnosis: Other abnormalities of gait and mobility (R26.89);Unsteadiness on feet (R26.81);Muscle weakness (generalized) (M62.81);History of falling (Z91.81);Other symptoms and signs involving cognitive function                Time: 9177-9153 OT Time Calculation (min): 24 min Charges:  OT General Charges $OT Visit: 1 Visit OT Evaluation $OT Eval Moderate Complexity: 1 Mod  Etta NOVAK, OT Acute Rehabilitation Services Office [phone] Secure Chat Preferred    Etta GORMAN Hope 03/11/2024, 10:17 AM

## 2024-03-11 NOTE — Progress Notes (Signed)
 PROGRESS NOTE    [name] [name]  FMW:[id number] DOB: [birth year] DOA: 03/02/2024 PCP: [name] Jeoffrey [name], [name]   Brief Narrative: 58 year old with past medical history significant for bipolar disorder type I, tardive dyskinesia, PTSD, MDD, GAD, diabetes type 2, hyperlipidemia, GERD, osteoarthritis, tremors who initially presented to the ED on 10/12 for evaluation of fall and chronic back pain.  Patient was previously living with a friend for the last 2 or 3 years, due to patient's recent bizarre behavior and concern for her own safety patient's friend does not wish for patient to return to her home.  TOC has been working on placement for patient but has been unsuccessful.  On 10/19 psychiatry was consulted for evaluation due to patient's endorsing suicidal thoughts, with a plan to hurt himself.  Patient was initially recommended for inpatient psychiatric admission however patient was ultimately cleared by psych on 10/22.  Patient has remained in the ED since then with multiple unsuccessful attempts at placement due to financial and behavioral barriers.  On the day of admission there was initial plan to discharge patient to extended stay hotel however on reevaluation, patient was found to be altered during the discharge process.  Per ED physician patient had acute change in his baseline mental status with concern for acute delirium.  Evaluation for delirium labs unremarkable no signs of infection CT head with no acute intracranial abnormality.  Patient has being admitted for acute encephalopathy.   Assessment & Plan:   Principal Problem:   Acute encephalopathy Active Problems:   Chronic low back pain without sciatica   Acute delirium   Generalized weakness   Bipolar 1 disorder (HCC)   1-Acute Metabolic Encephalopathy: - Patient with history of multiple psychiatric disorder, he was boarding in the ED for 2 weeks now had some acute changes in mental status. - CT head: No acute intracranial  abnormality. -dehydration, Ammonia less than 13, UA negative -TSH;2.6 , B 12;573 , ABG: Hypoxia. , chest x ray. Hypoventilation.  -EEG negative He is alert, less tremors this am, less agitated , will try MRI again.   Acute hypoxic respiratory failure, could be related to hypoventilation but also undiagnosed sleep apnea. Keep on 2 L of oxygen Chest x-ray showed hypoventilation   Bipolar 1 disorder Major depressive disorder GAD -Patient was cleared by psych initially while he was in the ED 10/22 - Continue benztropine , duloxetine , Depakote  and hydroxyzine  10/31: Psych consulted to assist adjusting medications.  Depakote  dose increased  Generalized weakness History of fall Chronic back pain -PT OT consulted  Dyskinesis Tremors -Continue with propranolol  and valbenazine   Diabetes type 2: With hypoglycemia Hold glipizide Hold Metformin .    Hypertension: Continue with lisinopril  Monitor renal function  Hyperlipidemia: Continue with statins.   Vitamin D  deficiency: resume vitamin d    Hemoconcentration, dehydration: Improved with IV fluids  Estimated body mass index is 27.65 kg/m as calculated from the following:   Height as of this encounter: 5' 10 (1.778 m).   Weight as of this encounter: 87.4 kg.   DVT prophylaxis: Lovenox  Code Status: Full code Family Communication: Disposition Plan:  Status is: Inpatient Remains inpatient appropriate because: management of encephalopathy     Consultants:  Psych   Procedures:  None  Antimicrobials:    Subjective: He was alert to place and year.  Though his sister was in the room at some point today. (No accurate) He is willing to have MRI done.   Objective: Vitals:   03/10/24 0556 03/10/24 1011 03/10/24 1701 03/11/24  0648  BP: (!) 160/119 (!) 108/95 (!) 144/78 (!) 141/90  Pulse: 65 95 84 66  Resp: 19   18  Temp: 98 F (36.7 C)   97.9 F (36.6 C)  TempSrc:    Oral  SpO2: 95%   93%  Weight:      Height:         Intake/Output Summary (Last 24 hours) at 03/11/2024 1401 Last data filed at 03/11/2024 1248 Gross per 24 hour  Intake 2920.88 ml  Output --  Net 2920.88 ml   Filed Weights   03/07/24 2229  Weight: 87.4 kg    Examination:  General exam: NAD Respiratory system: CTA Cardiovascular system: S 1, S 2 RRR Gastrointestinal system: BS present, soft, nt Central nervous system: alert, sitting recliner Extremities: No edema   Data Reviewed: I have personally reviewed following labs and imaging studies  CBC: Recent Labs  Lab 03/07/24 1535 03/08/24 0508 03/09/24 0821  WBC 7.9 8.5 6.4  NEUTROABS 4.2  --   --   HGB 17.7* 17.7* 15.9  HCT 53.4* 55.1* 47.9  MCV 85.9 87.0 86.0  PLT 316 333 260   Basic Metabolic Panel: Recent Labs  Lab 03/07/24 1535 03/08/24 0508 03/09/24 0821  NA 137 136 138  K 4.5 4.6 4.2  CL 97* 98 101  CO2 28 22 25   GLUCOSE 99 60* 82  BUN 21* 22* 19  CREATININE 1.12 1.13 0.92  CALCIUM  10.2 10.3 9.6   GFR: Estimated Creatinine Clearance: 89.3 mL/min (by C-G formula based on SCr of 0.92 mg/dL). Liver Function Tests: Recent Labs  Lab 03/07/24 1535  AST 29  ALT 23  ALKPHOS 100  BILITOT 0.8  PROT 8.2*  ALBUMIN 4.6   No results for input(s): LIPASE, AMYLASE in the last 168 hours. Recent Labs  Lab 03/07/24 1535  AMMONIA <13   Coagulation Profile: No results for input(s): INR, PROTIME in the last 168 hours. Cardiac Enzymes: No results for input(s): CKTOTAL, CKMB, CKMBINDEX, TROPONINI in the last 168 hours. BNP (last 3 results) No results for input(s): PROBNP in the last 8760 hours. HbA1C: No results for input(s): HGBA1C in the last 72 hours. CBG: Recent Labs  Lab 03/10/24 1257 03/10/24 1711 03/10/24 2201 03/11/24 0723 03/11/24 1205  GLUCAP 133* 99 110* 87 186*   Lipid Profile: No results for input(s): CHOL, HDL, LDLCALC, TRIG, CHOLHDL, LDLDIRECT in the last 72 hours. Thyroid  Function Tests: No  results for input(s): TSH, T4TOTAL, FREET4, T3FREE, THYROIDAB in the last 72 hours.  Anemia Panel: No results for input(s): VITAMINB12, FOLATE, FERRITIN, TIBC, IRON, RETICCTPCT in the last 72 hours.  Sepsis Labs: No results for input(s): PROCALCITON, LATICACIDVEN in the last 168 hours.  No results found for this or any previous visit (from the past 240 hours).       Radiology Studies: EEG adult Result Date: 03/10/2024 Shelton Arlin KIDD, MD     03/10/2024  9:16 AM Patient Name: [name] [name] MRN: [id number] Epilepsy Attending: Arlin KIDD Shelton Referring Physician/Provider: Madelyne Owen LABOR, MD Date: 03/09/2024 Duration: 23.15 mins Patient history: 58yo male with ams. EEG to evaluate for seizure Level of alertness: Awake AEDs during EEG study: VPA, Valium  Technical aspects: This EEG study was done with scalp electrodes positioned according to the 10-20 International system of electrode placement. Electrical activity was reviewed with band pass filter of 1-70Hz , sensitivity of 7 uV/mm, display speed of 77mm/sec with a 60Hz  notched filter applied as appropriate. EEG data were recorded continuously and digitally  stored.  Video monitoring was available and reviewed as appropriate. Description: EEG showed continuous generalized 3 to 6 Hz theta-delta slowing. Hyperventilation and photic stimulation were not performed.   ABNORMALITY - Continuous slow, generalized IMPRESSION: This study is suggestive of generalized cerebra dysfunction (encephalopathy). No seizures or epileptiform discharges were seen throughout the recording. Priyanka O Yadav        Scheduled Meds:  atorvastatin   20 mg Oral Daily   benztropine   1 mg Oral BID   divalproex   750 mg Oral BID   DULoxetine   40 mg Oral BID   enoxaparin  (LOVENOX ) injection  40 mg Subcutaneous Q24H   hydrOXYzine   50 mg Oral QHS   insulin  aspart  0-6 Units Subcutaneous TID WC   lisinopril   20 mg Oral Daily   LORazepam  1 mg  Intravenous Once   propranolol   10 mg Oral TID   valbenazine   80 mg Oral Daily   Vitamin D  (Ergocalciferol )  50,000 Units Oral Q7 days   Continuous Infusions:  lactated ringers  100 mL/hr at 03/11/24 0535   thiamine (VITAMIN B1) injection 500 mg (03/11/24 1012)     LOS: 4 days    Time spent: 35 Minutes    Gunner Iodice A Aramis Weil, MD [facility]   If 7PM-7AM, please contact night-coverage [email or web address]  03/11/2024, 2:01 PM

## 2024-03-12 ENCOUNTER — Inpatient Hospital Stay (HOSPITAL_COMMUNITY)

## 2024-03-12 DIAGNOSIS — G9340 Encephalopathy, unspecified: Secondary | ICD-10-CM | POA: Diagnosis not present

## 2024-03-12 LAB — GLUCOSE, CAPILLARY
Glucose-Capillary: 79 mg/dL (ref 70–99)
Glucose-Capillary: 95 mg/dL (ref 70–99)
Glucose-Capillary: 96 mg/dL (ref 70–99)
Glucose-Capillary: 97 mg/dL (ref 70–99)

## 2024-03-12 LAB — BASIC METABOLIC PANEL WITH GFR
Anion gap: 10 (ref 5–15)
BUN: 14 mg/dL (ref 6–20)
CO2: 28 mmol/L (ref 22–32)
Calcium: 9.7 mg/dL (ref 8.9–10.3)
Chloride: 101 mmol/L (ref 98–111)
Creatinine, Ser: 1.02 mg/dL (ref 0.61–1.24)
GFR, Estimated: >60 mL/min (ref >60)
Glucose, Bld: 78 mg/dL (ref 70–99)
Potassium: 3.9 mmol/L (ref 3.5–5.1)
Sodium: 139 mmol/L (ref 135–145)

## 2024-03-12 LAB — CBC
HCT: 47.6 % (ref 39.0–52.0)
Hemoglobin: 15.2 g/dL (ref 13.0–17.0)
MCH: 27.6 pg (ref 26.0–34.0)
MCHC: 31.9 g/dL (ref 30.0–36.0)
MCV: 86.5 fL (ref 80.0–100.0)
Platelets: 222 K/uL (ref 150–400)
RBC: 5.5 MIL/uL (ref 4.22–5.81)
RDW: 13.6 % (ref 11.5–15.5)
WBC: 5.9 K/uL (ref 4.0–10.5)
nRBC: 0 % (ref 0.0–0.2)

## 2024-03-12 MED ORDER — AMOXICILLIN-POT CLAVULANATE 875-125 MG PO TABS
1.0000 | ORAL_TABLET | Freq: Two times a day (BID) | ORAL | Status: AC
Start: 2024-03-12 — End: 2024-03-17
  Administered 2024-03-12 – 2024-03-16 (×9): 1 via ORAL
  Filled 2024-03-12 (×9): qty 1

## 2024-03-12 MED ORDER — CARMEX CLASSIC LIP BALM EX OINT
TOPICAL_OINTMENT | CUTANEOUS | Status: DC | PRN
  Administered 2024-03-12 – 2024-04-30 (×5): 1 via TOPICAL
  Filled 2024-03-12 (×2): qty 10

## 2024-03-12 MED ORDER — DOXYCYCLINE HYCLATE 100 MG PO TABS
100.0000 mg | ORAL_TABLET | Freq: Two times a day (BID) | ORAL | Status: DC
  Administered 2024-03-12 – 2024-03-15 (×6): 100 mg via ORAL
  Filled 2024-03-12 (×6): qty 1

## 2024-03-12 NOTE — Plan of Care (Signed)
   Problem: Health Behavior/Discharge Planning: Goal: Ability to manage health-related needs will improve Outcome: Progressing   Problem: Clinical Measurements: Goal: Ability to maintain clinical measurements within normal limits will improve Outcome: Progressing Goal: Will remain free from infection Outcome: Progressing Goal: Diagnostic test results will improve Outcome: Progressing

## 2024-03-12 NOTE — Progress Notes (Signed)
 PROGRESS NOTE    [name] [name]  FMW:[id number] DOB: [birth year] DOA: 03/02/2024 PCP: [name], [name]   Brief Narrative: 58 year old with past medical history significant for bipolar disorder type I, tardive dyskinesia, PTSD, MDD, GAD, diabetes type 2, hyperlipidemia, GERD, osteoarthritis, tremors who initially presented to the ED on 10/12 for evaluation of fall and chronic back pain.  Patient was previously living with a friend for the last 2 or 3 years, due to patient's recent bizarre behavior and concern for her own safety patient's friend does not wish for patient to return to her home.  TOC has been working on placement for patient but has been unsuccessful.  On 10/19 psychiatry was consulted for evaluation due to patient's endorsing suicidal thoughts, with a plan to hurt himself.  Patient was initially recommended for inpatient psychiatric admission however patient was ultimately cleared by psych on 10/22.  Patient has remained in the ED since then with multiple unsuccessful attempts at placement due to financial and behavioral barriers.  On the day of admission there was initial plan to discharge patient to extended stay hotel however on reevaluation, patient was found to be altered during the discharge process.  Per ED physician patient had acute change in his baseline mental status with concern for acute delirium.  Evaluation for delirium labs unremarkable no signs of infection CT head with no acute intracranial abnormality.  Patient has being admitted for acute encephalopathy.   Assessment & Plan:   Principal Problem:   Acute encephalopathy Active Problems:   Chronic low back pain without sciatica   Acute delirium   Generalized weakness   Bipolar 1 disorder (HCC)   1-Acute Metabolic Encephalopathy: - Patient with history of multiple psychiatric disorder, he was boarding in the ED for 2 weeks now had some acute changes in mental status. - CT head: No acute intracranial  abnormality. -dehydration, Ammonia less than 13, UA negative -Follow MRI Pending , TSH;2.6 , B 12;573 , ABG: Hypoxia. , chest x ray. Hypoventilation.  -EEG negative He was agitated yesterday required haldol IM.  He is sleepy today, no interactive during my evaluation.  Plan to try MRI here at [facility] if he is not abel to tolerates will need MRI under sedation  Thiamine level pending.  Started on IV thiamine.  Is no improvement MS- and MRI unrevealing might need neurology evaluation.   Acute hypoxic respiratory failure, could be related to hypoventilation but also undiagnosed sleep apnea. Keep on 2 L of oxygen Chest x-ray showed hypoventilation He needs oxygen at HS.   Bipolar 1 disorder Major depressive disorder GAD -Patient was cleared by psych initially while he was in the ED 10/22 - Continue benztropine , duloxetine , Depakote  and hydroxyzine  10/31: Psych consulted to assist adjusting medications.  Depakote  dose increased  Generalized weakness History of fall Chronic back pain -PT OT consulted  Dyskinesis Tremors -Continue with propranolol  and valbenazine   Diabetes type 2: With hypoglycemia Hold glipizide Hold Metformin .   Oral Ulcer: lower lip. Appears infected, he might have been biting lip.  Start Oral antibiotics,Augmentin and Doxy Magic mouth wash  Hypertension: Continue with lisinopril  Monitor renal function  Hyperlipidemia: Continue with statins.   Vitamin D  deficiency: resume vitamin d    Hemoconcentration, Dehydration: Improved with IV fluids Hb on admission at 17---after hydration down to 15.  Estimated body mass index is 27.65 kg/m as calculated from the following:   Height as of this encounter: 5' 10 (1.778 m).   Weight as of this encounter: 87.4 kg.  DVT prophylaxis: Lovenox  Code Status: Full code Family Communication: Disposition Plan:  Status is: Inpatient Remains inpatient appropriate because: management of encephalopathy      Consultants:  Psych   Procedures:  None  Antimicrobials:    Subjective: He is sleepy, per sitter he ate breakfast. Keep removing oxygen.  He was agitated yesterday required Haldol.   Objective: Vitals:   03/11/24 0648 03/11/24 1514 03/11/24 2114 03/12/24 0558  BP: (!) 141/90 (!) 114/97 (!) 140/106 (!) 137/113  Pulse: 66 68 72 68  Resp: 18 16 18 18   Temp: 97.9 F (36.6 C) 98 F (36.7 C) 97.6 F (36.4 C) (!) 97.5 F (36.4 C)  TempSrc: Oral  Oral Oral  SpO2: 93% (!) 88% 93% 93%  Weight:      Height:        Intake/Output Summary (Last 24 hours) at 03/12/2024 1149 Last data filed at 03/12/2024 1000 Gross per 24 hour  Intake 2975.82 ml  Output --  Net 2975.82 ml   Filed Weights   03/07/24 2229  Weight: 87.4 kg    Examination:  General exam: NAD Respiratory system: CTA Cardiovascular system: S 1, S 2 RRR Gastrointestinal system:  BS present, soft, nt Central nervous system: wake up says few words, not oriented.  Extremities: no edema   Data Reviewed: I have personally reviewed following labs and imaging studies  CBC: Recent Labs  Lab 03/07/24 1535 03/08/24 0508 03/09/24 0821 03/12/24 0429  WBC 7.9 8.5 6.4 5.9  NEUTROABS 4.2  --   --   --   HGB 17.7* 17.7* 15.9 15.2  HCT 53.4* 55.1* 47.9 47.6  MCV 85.9 87.0 86.0 86.5  PLT 316 333 260 222   Basic Metabolic Panel: Recent Labs  Lab 03/07/24 1535 03/08/24 0508 03/09/24 0821 03/12/24 0429  NA 137 136 138 139  K 4.5 4.6 4.2 3.9  CL 97* 98 101 101  CO2 28 22 25 28   GLUCOSE 99 60* 82 78  BUN 21* 22* 19 14  CREATININE 1.12 1.13 0.92 1.02  CALCIUM  10.2 10.3 9.6 9.7   GFR: Estimated Creatinine Clearance: 80.5 mL/min (by C-G formula based on SCr of 1.02 mg/dL). Liver Function Tests: Recent Labs  Lab 03/07/24 1535  AST 29  ALT 23  ALKPHOS 100  BILITOT 0.8  PROT 8.2*  ALBUMIN 4.6   No results for input(s): LIPASE, AMYLASE in the last 168 hours. Recent Labs  Lab 03/07/24 1535   AMMONIA <13   Coagulation Profile: No results for input(s): INR, PROTIME in the last 168 hours. Cardiac Enzymes: No results for input(s): CKTOTAL, CKMB, CKMBINDEX, TROPONINI in the last 168 hours. BNP (last 3 results) No results for input(s): PROBNP in the last 8760 hours. HbA1C: No results for input(s): HGBA1C in the last 72 hours. CBG: Recent Labs  Lab 03/11/24 1205 03/11/24 1637 03/11/24 2111 03/12/24 0738 03/12/24 1135  GLUCAP 186* 87 95 79 95   Lipid Profile: No results for input(s): CHOL, HDL, LDLCALC, TRIG, CHOLHDL, LDLDIRECT in the last 72 hours. Thyroid  Function Tests: No results for input(s): TSH, T4TOTAL, FREET4, T3FREE, THYROIDAB in the last 72 hours.  Anemia Panel: No results for input(s): VITAMINB12, FOLATE, FERRITIN, TIBC, IRON, RETICCTPCT in the last 72 hours.  Sepsis Labs: No results for input(s): PROCALCITON, LATICACIDVEN in the last 168 hours.  No results found for this or any previous visit (from the past 240 hours).       Radiology Studies: No results found.  Scheduled Meds:  atorvastatin   20 mg Oral Daily   benztropine   1 mg Oral BID   divalproex   750 mg Oral BID   DULoxetine   40 mg Oral BID   enoxaparin  (LOVENOX ) injection  40 mg Subcutaneous Q24H   hydrOXYzine   50 mg Oral QHS   insulin  aspart  0-6 Units Subcutaneous TID WC   lisinopril   20 mg Oral Daily   LORazepam  1 mg Intravenous Once   propranolol   10 mg Oral TID   valbenazine   80 mg Oral Daily   Vitamin D  (Ergocalciferol )  50,000 Units Oral Q7 days   Continuous Infusions:  lactated ringers  100 mL/hr at 03/12/24 0234     LOS: 5 days    Time spent: 35 Minutes    Corby Vandenberghe A Carlesha Seiple, MD [facility]   If 7PM-7AM, please contact night-coverage [email or web address]  03/12/2024, 11:49 AM

## 2024-03-12 NOTE — Progress Notes (Signed)
 Mobility Specialist - Progress Note   03/12/24 0823  Mobility  Activity Dangled on edge of bed  Level of Assistance Moderate assist, patient does 50-74%  Range of Motion/Exercises Active Assistive  Activity Response Tolerated fair  Mobility Referral Yes  Mobility visit 1 Mobility  Mobility Specialist Start Time (ACUTE ONLY) 0808  Mobility Specialist Stop Time (ACUTE ONLY) 0823  Mobility Specialist Time Calculation (min) (ACUTE ONLY) 15 min   Pt was found in bed and agreeable to mobilize. After sitting EOB pt stated not wanting to ambulate until after breakfast. Encouraged to mobilize until breakfast arrived but pt adamant on eating first. Pt returned to bed with all needs met. Call bell in reach and bed alarm on. Sitter in room.   Erminio Leos,  Mobility Specialist Can be reached via Secure Chat

## 2024-03-13 ENCOUNTER — Encounter (HOSPITAL_COMMUNITY)
Admission: EM | Disposition: A | Discharge status: Other Acute Inpatient Hospital | Payer: Self-pay | Source: Home / Self Care | Attending: Student

## 2024-03-13 ENCOUNTER — Inpatient Hospital Stay (HOSPITAL_COMMUNITY): Payer: Self-pay | Admitting: Anesthesiology

## 2024-03-13 ENCOUNTER — Ambulatory Visit (HOSPITAL_COMMUNITY)
Admit: 2024-03-13 | Discharge: 2024-03-13 | Disposition: A | Discharge status: Home / Self Care | Attending: Internal Medicine | Admitting: Internal Medicine

## 2024-03-13 ENCOUNTER — Encounter (HOSPITAL_COMMUNITY): Payer: Self-pay | Admitting: Student

## 2024-03-13 ENCOUNTER — Encounter (HOSPITAL_COMMUNITY): Payer: Self-pay | Admitting: Anesthesiology

## 2024-03-13 DIAGNOSIS — G2401 Drug induced subacute dyskinesia: Secondary | ICD-10-CM

## 2024-03-13 DIAGNOSIS — F3130 Bipolar disorder, current episode depressed, mild or moderate severity, unspecified: Secondary | ICD-10-CM

## 2024-03-13 DIAGNOSIS — G20C Parkinsonism, unspecified: Secondary | ICD-10-CM

## 2024-03-13 DIAGNOSIS — G9340 Encephalopathy, unspecified: Secondary | ICD-10-CM

## 2024-03-13 DIAGNOSIS — I959 Hypotension, unspecified: Secondary | ICD-10-CM

## 2024-03-13 DIAGNOSIS — R29898 Other symptoms and signs involving the musculoskeletal system: Secondary | ICD-10-CM

## 2024-03-13 DIAGNOSIS — F419 Anxiety disorder, unspecified: Secondary | ICD-10-CM | POA: Diagnosis not present

## 2024-03-13 DIAGNOSIS — I10 Essential (primary) hypertension: Secondary | ICD-10-CM

## 2024-03-13 DIAGNOSIS — E11649 Type 2 diabetes mellitus with hypoglycemia without coma: Secondary | ICD-10-CM

## 2024-03-13 DIAGNOSIS — R6889 Other general symptoms and signs: Secondary | ICD-10-CM

## 2024-03-13 HISTORY — PX: RADIOLOGY WITH ANESTHESIA: SHX6223

## 2024-03-13 LAB — BASIC METABOLIC PANEL WITH GFR
Anion gap: 9 (ref 5–15)
BUN: 15 mg/dL (ref 6–20)
CO2: 26 mmol/L (ref 22–32)
Calcium: 9.1 mg/dL (ref 8.9–10.3)
Chloride: 103 mmol/L (ref 98–111)
Creatinine, Ser: 0.87 mg/dL (ref 0.61–1.24)
GFR, Estimated: >60 mL/min (ref >60)
Glucose, Bld: 85 mg/dL (ref 70–99)
Potassium: 4.1 mmol/L (ref 3.5–5.1)
Sodium: 137 mmol/L (ref 135–145)

## 2024-03-13 LAB — CBC
HCT: 44.5 % (ref 39.0–52.0)
Hemoglobin: 14.1 g/dL (ref 13.0–17.0)
MCH: 27.8 pg (ref 26.0–34.0)
MCHC: 31.7 g/dL (ref 30.0–36.0)
MCV: 87.6 fL (ref 80.0–100.0)
Platelets: 213 K/uL (ref 150–400)
RBC: 5.08 MIL/uL (ref 4.22–5.81)
RDW: 13.6 % (ref 11.5–15.5)
WBC: 6.1 K/uL (ref 4.0–10.5)
nRBC: 0 % (ref 0.0–0.2)

## 2024-03-13 LAB — GLUCOSE, CAPILLARY
Glucose-Capillary: 91 mg/dL (ref 70–99)
Glucose-Capillary: 77 mg/dL (ref 70–99)
Glucose-Capillary: 64 mg/dL — ABNORMAL LOW (ref 70–99)
Glucose-Capillary: 71 mg/dL (ref 70–99)
Glucose-Capillary: 94 mg/dL (ref 70–99)

## 2024-03-13 LAB — MRSA NEXT GEN BY PCR, NASAL: MRSA by PCR Next Gen: DETECTED — AB

## 2024-03-13 LAB — VITAMIN B1: Vitamin B1 (Thiamine): 151.8 nmol/L (ref 66.5–200.0)

## 2024-03-13 MED ORDER — ROCURONIUM BROMIDE 10 MG/ML (PF) SYRINGE
PREFILLED_SYRINGE | INTRAVENOUS | Status: DC | PRN
Start: 2024-03-13 — End: 2024-03-13
  Administered 2024-03-13: 80 mg via INTRAVENOUS

## 2024-03-13 MED ORDER — ACETAMINOPHEN 10 MG/ML IV SOLN: 1000.0000 mg | Freq: Once | INTRAVENOUS | Status: DC | PRN

## 2024-03-13 MED ORDER — FENTANYL CITRATE (PF) 100 MCG/2ML IJ SOLN: 25.0000 ug | INTRAMUSCULAR | Status: DC | PRN

## 2024-03-13 MED ORDER — ALBUMIN HUMAN 5 % IV SOLN
INTRAVENOUS | Status: AC
  Filled 2024-03-13: qty 250

## 2024-03-13 MED ORDER — PHENYLEPHRINE 80 MCG/ML (10ML) SYRINGE FOR IV PUSH (FOR BLOOD PRESSURE SUPPORT)
PREFILLED_SYRINGE | INTRAVENOUS | Status: DC | PRN
Start: 2024-03-13 — End: 2024-03-13
  Administered 2024-03-13 (×8): 160 ug via INTRAVENOUS

## 2024-03-13 MED ORDER — LACTATED RINGERS IV SOLN: INTRAVENOUS | Status: DC

## 2024-03-13 MED ORDER — DEXMEDETOMIDINE HCL IN NACL 200 MCG/50ML IV SOLN
INTRAVENOUS | Status: DC | PRN
  Administered 2024-03-13: .5 ug/kg/h via INTRAVENOUS

## 2024-03-13 MED ORDER — SUGAMMADEX SODIUM 200 MG/2ML IV SOLN
INTRAVENOUS | Status: DC | PRN
  Administered 2024-03-13: 400 mg via INTRAVENOUS

## 2024-03-13 MED ORDER — LIDOCAINE 2% (20 MG/ML) 5 ML SYRINGE
INTRAMUSCULAR | Status: DC | PRN
Start: 2024-03-13 — End: 2024-03-13
  Administered 2024-03-13: 80 mg via INTRAVENOUS

## 2024-03-13 MED ORDER — PROPOFOL 10 MG/ML IV BOLUS
INTRAVENOUS | Status: DC | PRN
  Administered 2024-03-13: 100 mg via INTRAVENOUS

## 2024-03-13 MED ORDER — OXYCODONE HCL 5 MG/5ML PO SOLN: 5.0000 mg | Freq: Once | ORAL | Status: DC | PRN

## 2024-03-13 MED ORDER — DEXMEDETOMIDINE HCL IN NACL 400 MCG/100ML IV SOLN
INTRAVENOUS | Status: AC
  Filled 2024-03-13: qty 100

## 2024-03-13 MED ORDER — ONDANSETRON HCL 4 MG/2ML IJ SOLN
INTRAMUSCULAR | Status: DC | PRN
  Administered 2024-03-13: 4 mg via INTRAVENOUS

## 2024-03-13 MED ORDER — SODIUM CHLORIDE 0.9 % IV SOLN
25.0000 ug/min | INTRAVENOUS | Status: DC
  Administered 2024-03-13: 100 ug/min via INTRAVENOUS

## 2024-03-13 MED ORDER — EPINEPHRINE 1 MG/10ML IV SOSY
PREFILLED_SYRINGE | INTRAVENOUS | Status: DC | PRN
  Administered 2024-03-13 (×2): 5 ug via INTRAVENOUS
  Administered 2024-03-13: 10 ug via INTRAVENOUS

## 2024-03-13 MED ORDER — OXYCODONE HCL 5 MG PO TABS: 5.0000 mg | ORAL_TABLET | Freq: Once | ORAL | Status: DC | PRN

## 2024-03-13 MED ORDER — ALBUMIN HUMAN 5 % IV SOLN
12.5000 g | Freq: Once | INTRAVENOUS | Status: AC
  Administered 2024-03-13: 12.5 g via INTRAVENOUS

## 2024-03-13 MED ORDER — EPHEDRINE SULFATE-NACL 50-0.9 MG/10ML-% IV SOSY
PREFILLED_SYRINGE | INTRAVENOUS | Status: DC | PRN
  Administered 2024-03-13 (×4): 5 mg via INTRAVENOUS

## 2024-03-13 MED ORDER — MIDAZOLAM HCL (PF) 2 MG/2ML IJ SOLN: 1.0000 mg | INTRAMUSCULAR | Status: DC | PRN

## 2024-03-13 MED ORDER — SODIUM CHLORIDE 0.9 % IV BOLUS: 500.0000 mL | Freq: Once | INTRAVENOUS | Status: AC

## 2024-03-13 MED ORDER — CHLORHEXIDINE GLUCONATE CLOTH 2 % EX PADS
6.0000 | MEDICATED_PAD | Freq: Every day | CUTANEOUS | Status: DC
  Administered 2024-03-13 – 2024-03-18 (×4): 6 via TOPICAL

## 2024-03-13 MED ORDER — LACTATED RINGERS IV SOLN: INTRAVENOUS | Status: DC | PRN

## 2024-03-13 MED ORDER — MIDAZOLAM HCL 2 MG/2ML IJ SOLN
INTRAMUSCULAR | Status: AC
  Filled 2024-03-13: qty 2

## 2024-03-13 MED ORDER — PROPOFOL 10 MG/ML IV BOLUS
INTRAVENOUS | Status: AC
  Filled 2024-03-13: qty 20

## 2024-03-13 MED ORDER — ONDANSETRON HCL 4 MG/2ML IJ SOLN: 4.0000 mg | Freq: Once | INTRAMUSCULAR | Status: DC | PRN

## 2024-03-13 MED ORDER — DEXMEDETOMIDINE HCL IN NACL 80 MCG/20ML IV SOLN
INTRAVENOUS | Status: AC
Start: 2024-03-13 — End: 2024-03-13
  Filled 2024-03-13: qty 20

## 2024-03-13 MED ORDER — LACTATED RINGERS IV BOLUS
1000.0000 mL | Freq: Once | INTRAVENOUS | Status: AC
  Administered 2024-03-13: 1000 mL via INTRAVENOUS

## 2024-03-13 MED ORDER — ALBUMIN HUMAN 5 % IV SOLN: INTRAVENOUS | Status: DC | PRN

## 2024-03-13 MED ORDER — MIDAZOLAM HCL (PF) 2 MG/2ML IJ SOLN
INTRAMUSCULAR | Status: DC | PRN
  Administered 2024-03-13: 2 mg via INTRAVENOUS

## 2024-03-13 MED ORDER — FENTANYL CITRATE (PF) 100 MCG/2ML IJ SOLN
INTRAMUSCULAR | Status: AC
  Filled 2024-03-13: qty 2

## 2024-03-13 MED ORDER — SODIUM CHLORIDE 0.9 % IV SOLN: 250.0000 mL | INTRAVENOUS | Status: DC

## 2024-03-13 NOTE — Progress Notes (Signed)
 Pt arrived to [facility] short stay via [facility]. Pt confused and combative. CRNA and anesthesiologist at bedside. Will plan to obtain additional IV and CBG once sedated. Pt placed on NRB mask for oxygen support after Versed administration by CRNA due to pt pulling at tubes and combative behavior. On continuous monitor. Awaiting availability of MRI. No family to consent, Dr. Keneth deemed emergent.

## 2024-03-13 NOTE — Anesthesia Postprocedure Evaluation (Signed)
 Anesthesia Post Note  Patient: KIMMIE DOREN  Procedure(s) Performed: MRI WITH ANESTHESIA     Patient location during evaluation: PACU Anesthesia Type: General Level of consciousness: lethargic and confused Pain management: pain level controlled Vital Signs Assessment: post-procedure vital signs reviewed and stable Respiratory status: spontaneous breathing, nonlabored ventilation, respiratory function stable and patient connected to nasal cannula oxygen Cardiovascular status: blood pressure returned to baseline and stable Postop Assessment: no apparent nausea or vomiting Anesthetic complications: no   No notable events documented.  Last Vitals:  Vitals:   03/13/24 1300 03/13/24 1315  BP: 112/74 120/76  Pulse: 65 61  Resp: 13 11  Temp:  36.4 C  SpO2: 97% 99%    Last Pain:  Vitals:   03/13/24 1300  TempSrc:   PainSc: 0-No pain                 Lynwood MARLA Cornea

## 2024-03-13 NOTE — Anesthesia Preprocedure Evaluation (Addendum)
 Anesthesia Evaluation  Patient identified by MRN, date of birth, ID band Patient confused    Reviewed: Allergy & Precautions, Patient's Chart, lab work & pertinent test resultsPreop documentation limited or incomplete due to emergent nature of procedure.  History of Anesthesia Complications Negative for: history of anesthetic complications  Airway Mallampati: Unable to assess       Dental   Pulmonary    breath sounds clear to auscultation       Cardiovascular hypertension,  Rhythm:Regular Rate:Tachycardia  IMPRESSIONS     1. Left ventricular ejection fraction, by estimation, is 60 to 65%. The  left ventricle has normal function. The left ventricle has no regional  wall motion abnormalities. Left ventricular diastolic parameters are  indeterminate.   2. Right ventricular systolic function is normal. The right ventricular  size is normal.   3. The mitral valve is normal in structure. No evidence of mitral valve  regurgitation.   4. The aortic valve is normal in structure. Aortic valve regurgitation is  not visualized.     Neuro/Psych  Headaches PSYCHIATRIC DISORDERS Anxiety Depression Bipolar Disorder   Metabolic encephalopathy  Neuromuscular disease    GI/Hepatic ,GERD  ,,(+) neg Cirrhosis        Endo/Other  diabetes, Type 2    Renal/GU Renal disease     Musculoskeletal  (+) Arthritis , Osteoarthritis,    Abdominal   Peds  Hematology   Anesthesia Other Findings   Reproductive/Obstetrics                              Anesthesia Physical Anesthesia Plan  ASA: 3 and emergent  Anesthesia Plan: General   Post-op Pain Management:    Induction: Intravenous  PONV Risk Score and Plan: 2 and Ondansetron  Airway Management Planned: Oral ETT  Additional Equipment:   Intra-op Plan:   Post-operative Plan: Extubation in OR  Informed Consent: I have reviewed the patients History  and Physical, chart, labs and discussed the procedure including the risks, benefits and alternatives for the proposed anesthesia with the patient or authorized representative who has indicated his/her understanding and acceptance.     Dental advisory given  Plan Discussed with: CRNA  Anesthesia Plan Comments:          Anesthesia Quick Evaluation

## 2024-03-13 NOTE — Transfer of Care (Addendum)
 Immediate Anesthesia Transfer of Care Note  Patient: [name] [name]  Procedure(s) Performed: MRI WITH ANESTHESIA  Patient Location: PACU  Anesthesia Type:General  Level of Consciousness: drowsy  Airway & Oxygen Therapy: Patient Spontanous Breathing and Patient connected to face mask oxygen  Post-op Assessment: Report given to RN, Post -op Vital signs reviewed and stable, Patient moving all extremities, and Patient moving all extremities X 4  Post vital signs: Reviewed and stable  Last Vitals:  Vitals Value Taken Time  BP 114/64 03/13/24 11:00  Temp    Pulse 67 03/13/24 11:02  Resp 20 03/13/24 11:02  SpO2 100 % 03/13/24 11:02  Vitals shown include unfiled device data.  Last Pain:  Vitals:   03/13/24 0522  TempSrc: Axillary  PainSc:       Patients Stated Pain Goal: 2 (03/12/24 0805)  Complications: No notable events documented.

## 2024-03-13 NOTE — Consult Note (Addendum)
 NAME:  [name] [name], MRN:  [id number], DOB:  02-05-1966, LOS: 6 ADMISSION DATE:  03/02/2024, CONSULTATION DATE:  03/13/24 REFERRING MD:  Dr. Madelyne, CHIEF COMPLAINT:  hypotension   History of Present Illness:   40 yoM with PMH as below significant for DM, GERD, HTN, HLD, bipolar 1, PTSD, MDD, CAD, chronic lower back pain, falls, and obesity.  Initially presented 10/12 to [facility] ER eval for fall and chronic back pain.  Pt unable to return to previous living situation given behavior and was awaiting placement.  10/19 psych consulted given SI thoughts, cleared by psych on 10/22.  Remained in ER awaiting placement complicated by financial and behavioral barriers until found to have unclear encephalopathy, therefore admitted to [facility] for further workup on 10/24.  Since his encephalopathy workup has been unrevealing with negative CTH, EEG, labs.  Seen again by psych 10/31, depakote  was increased and haldol recommended for acute agitation, acute delirium suspected.  Has been lethargic, answering some questions previously, at baseline with slowed responses.  Was sent to [facility] for MRI brain with general sedation with anesthesia.  Post MRI, required precedex and found to be hypotensive.  PCCM consulted.  Precedex was stopped, neosynephrine started in addition to be given albumin x 2 and completing 2L LR by anesthesia.    Pertinent  Medical History   Past Medical History:  Diagnosis Date   Anxiety    Arthritis    Chest heaviness    Chills    Depression, major    Diabetes mellitus without complication (HCC)    Dizzy    Dyslipidemia    ED (erectile dysfunction)    Elevated CPK    GERD (gastroesophageal reflux disease)    HA (headache)    Hypertension    Hypertriglyceridemia    Insomnia    Kidney stone    Left-sided Bell's palsy    Morbid obesity (HCC)    Nausea    No appetite    Panniculitis    of right lower leg   Parkinson's disease (HCC)    PTSD (post-traumatic stress disorder)    Routine  screening for STI (sexually transmitted infection) 01/05/2023   Weakness    Significant Hospital Events: Including procedures, antibiotic start and stop dates in addition to other pertinent events     Interim History / Subjective:  Neo at 39 and weaning   Objective    Blood pressure (!) 154/77, pulse (!) 41, temperature (!) 97.4 F (36.3 C), resp. rate 15, height 5' 10 (1.778 m), weight 87.4 kg, SpO2 100%.        Intake/Output Summary (Last 24 hours) at 03/13/2024 1148 Last data filed at 03/13/2024 1030 Gross per 24 hour  Intake 2636.9 ml  Output --  Net 2636.9 ml   Filed Weights   03/07/24 2229  Weight: 87.4 kg    Examination: General:  Older male lying in bed in NAD HEENT: MM pink/moist, healing ulceration to bottom of lip, lips chapped, pupils 4/r Neuro:  lethargic, will localize with noxious stimuli, MAE, not answering questions or verbal CV: rr, SB 57 PULM:  non labored, clear, diminished in lowers, +gag, good cough, on room air  GI: soft, bs+, NT Extremities: warm/dry, no LE edema  Skin: no rashes   Resolved problem list   Assessment and Plan   Hypotension - likely in the setting of dehydration and sedation.  Normotensive prior to.  P:  - monitor in ICU today, has ICU bed at [facility] - cont to wean  Neo for MAP goal > 65, almost titrated off  - s/p albumin x 2 and completing 2L LR - bladder scan if not voided w/in 8hrs - hold lisinopril    Acute encephalopathy, suspect component of delirium Bipolar 1 disorder Depression Anxiety Tardive dyskinsia/ tremors - prior encephalopathy workup unrevealing > CTH neg, EEG neg, neg ammonia, TSH 2.6, B12 573 P:  - pending MRI brain>> NAICA, mild chronic small vessel ischemic disease and cerebral atrophy, small chronic right cerebellar infarct - currently protecting airway, remains lethargic but will localize - has been afebrile, normal CBC - pending thiamine level, cont empiric thiamine  - prn haldol if needed, would not  restart precedex  - cont benztropine , duloxetine , depakote , hydroxyzine , valbenazine , trazodone> may need to cut back on depakote  if could be contributing to his ongoing lethargy - delirium precautions - avoid benzo's per former psych recommendations   DMT2 with hypoglycemia - pta glipizide and metformin  continue to be held with CBGs in 70-80s on labs HLD- statin HTN- hold lisinopril  today Lip ulcer- suspected from biting lip, on augmentin and doxy per primary team Chronic back pain - PT, high fall risk GERD  Labs   CBC: Recent Labs  Lab 03/07/24 1535 03/08/24 0508 03/09/24 0821 03/12/24 0429  WBC 7.9 8.5 6.4 5.9  NEUTROABS 4.2  --   --   --   HGB 17.7* 17.7* 15.9 15.2  HCT 53.4* 55.1* 47.9 47.6  MCV 85.9 87.0 86.0 86.5  PLT 316 333 260 222    Basic Metabolic Panel: Recent Labs  Lab 03/07/24 1535 03/08/24 0508 03/09/24 0821 03/12/24 0429  NA 137 136 138 139  K 4.5 4.6 4.2 3.9  CL 97* 98 101 101  CO2 28 22 25 28   GLUCOSE 99 60* 82 78  BUN 21* 22* 19 14  CREATININE 1.12 1.13 0.92 1.02  CALCIUM  10.2 10.3 9.6 9.7   GFR: Estimated Creatinine Clearance: 80.5 mL/min (by C-G formula based on SCr of 1.02 mg/dL). Recent Labs  Lab 03/07/24 1535 03/08/24 0508 03/09/24 0821 03/12/24 0429  WBC 7.9 8.5 6.4 5.9    Liver Function Tests: Recent Labs  Lab 03/07/24 1535  AST 29  ALT 23  ALKPHOS 100  BILITOT 0.8  PROT 8.2*  ALBUMIN 4.6   No results for input(s): LIPASE, AMYLASE in the last 168 hours. Recent Labs  Lab 03/07/24 1535  AMMONIA <13    ABG    Component Value Date/Time   PHART 7.49 (H) 03/08/2024 1015   PCO2ART 34 03/08/2024 1015   PO2ART 65 (L) 03/08/2024 1015   HCO3 26.1 03/08/2024 1015   TCO2 26 10/13/2016 1310   O2SAT 95.5 03/08/2024 1015     Coagulation Profile: No results for input(s): INR, PROTIME in the last 168 hours.  Cardiac Enzymes: No results for input(s): CKTOTAL, CKMB, CKMBINDEX, TROPONINI in the last 168  hours.  HbA1C: Hemoglobin A1C  Date/Time Value Ref Range Status  08/31/2022 12:00 AM 6.3  Final   Hgb A1c MFr Bld  Date/Time Value Ref Range Status  12/08/2023 02:35 PM 6.3 (H) <5.7 % Final    Comment:    For someone without known diabetes, a hemoglobin  A1c value between 5.7% and 6.4% is consistent with prediabetes and should be confirmed with a  follow-up test. . For someone with known diabetes, a value <7% indicates that their diabetes is well controlled. A1c targets should be individualized based on duration of diabetes, age, comorbid conditions, and other considerations. . This assay result  is consistent with an increased risk of diabetes. . Currently, no consensus exists regarding use of hemoglobin A1c for diagnosis of diabetes for children. SABRA   10/10/2023 12:00 PM 5.9 (H) 4.8 - 5.6 % Final    Comment:             Prediabetes: 5.7 - 6.4          Diabetes: >6.4          Glycemic control for adults with diabetes: <7.0     CBG: Recent Labs  Lab 03/12/24 0738 03/12/24 1135 03/12/24 1621 03/12/24 2047 03/13/24 1038  GLUCAP 79 95 96 97 91    Review of Systems:   Unable   Past Medical History:  He,  has a past medical history of Anxiety, Arthritis, Chest heaviness, Chills, Depression, major, Diabetes mellitus without complication (HCC), Dizzy, Dyslipidemia, ED (erectile dysfunction), Elevated CPK, GERD (gastroesophageal reflux disease), HA (headache), Hypertension, Hypertriglyceridemia, Insomnia, Kidney stone, Left-sided Bell's palsy, Morbid obesity (HCC), Nausea, No appetite, Panniculitis, Parkinson's disease (HCC), PTSD (post-traumatic stress disorder), Routine screening for STI (sexually transmitted infection) (01/05/2023), and Weakness.   Surgical History:   Past Surgical History:  Procedure Laterality Date   NO PAST SURGERIES       Social History:   reports that he has never smoked. He has never used smokeless tobacco. He reports that he does not drink  alcohol and does not use drugs.   Family History:  His family history includes Anxiety disorder in his father and mother; Cancer in his paternal grandfather; Depression in his brother; Diabetes in his father; Heart attack in his maternal grandfather; Heart disease in his mother; Heart failure in his paternal grandmother; Kidney failure in his father; Mental illness in his sister; Thyroid  disease in his brother and sister.   Allergies No Known Allergies   Home Medications  Prior to Admission medications   Medication Sig Start Date End Date Taking? Authorizing Provider  atorvastatin  (LIPITOR) 20 MG tablet TAKE 1 TABLET BY MOUTH DAILY 11/28/23  Yes Kayla, [facility] S, [name]  benztropine  (COGENTIN ) 1 MG tablet Take 1 tablet (1 mg total) by mouth 2 (two) times daily. 01/26/24  Yes Harl Regan E, NP  divalproex  (DEPAKOTE ) 500 MG DR tablet Take 1 tablet (500 mg total) by mouth 2 (two) times daily. 01/26/24  Yes Harl Regan E, NP  DULoxetine  HCl 40 MG CPEP Take 40 mg by mouth 2 (two) times daily.   Yes [provider]  glimepiride  (AMARYL ) 4 MG tablet TAKE 1 TABLET (4 MG TOTAL) BY MOUTH 2 (TWO) TIMES DAILY. 08/22/23  Yes Howard, Amber S, [name]  hydrOXYzine  (ATARAX ) 50 MG tablet TAKE 1 TABLET BY MOUTH FOUR TIMES A DAY & TAKE 2 TABLETS EVERY NIGHT AT BEDTIME FOR ANXIETY, PANIC, AGITATION, AND SLEEP Patient taking differently: Take 50-100 mg by mouth See admin instructions. Take 50 mg by mouth four times a day and 100 mg at bedtime 01/26/24  Yes Harl Regan E, NP  lisinopril  (ZESTRIL ) 20 MG tablet TAKE 1 TABLET BY MOUTH DAILY 02/15/24  Yes Kayla, [facility] S, [name]  metFORMIN  (GLUCOPHAGE ) 500 MG tablet TAKE 1 TABLET (500 MG TOTAL) BY MOUTH 2 (TWO) TIMES DAILY. 08/22/23  Yes Howard, Amber S, [name]  propranolol  (INDERAL ) 10 MG tablet Take 1 tablet (10 mg total) by mouth 3 (three) times daily. 01/26/24  Yes Harl Regan E, NP  valbenazine  (INGREZZA ) 80 MG capsule Take 1 capsule (80 mg total) by  mouth daily. 01/26/24  Yes Harl Regan  E, NP  sildenafil  (REVATIO ) 20 MG tablet Take 1 tablet (20 mg total) by mouth daily as needed (as directed for E.D.). Patient not taking: Reported on 02/26/2024 02/09/23   Duanne Butler DASEN, MD     Critical care time:     CRITICAL CARE Performed by: Lyle Pesa   Total critical care time: 45 minutes  Critical care time was exclusive of separately billable procedures and treating other patients.  Critical care was necessary to treat or prevent imminent or life-threatening deterioration.  Critical care was time spent personally by me on the following activities: development of treatment plan with patient and/or surrogate as well as nursing, discussions with consultants, evaluation of patient's response to treatment, examination of patient, obtaining history from patient or surrogate, ordering and performing treatments and interventions, ordering and review of laboratory studies, ordering and review of radiographic studies, pulse oximetry and re-evaluation of patient's condition.    Lyle Pesa, NP [facility] 03/13/2024, 1:11 PM  See Amion for pager If no response to pager , please call [phone] until 7pm After 7:00 pm call Elink  [phone]

## 2024-03-13 NOTE — Progress Notes (Addendum)
 PROGRESS NOTE    [name] [name]  FMW:[id number] DOB: 08-16-1965 DOA: 03/02/2024 PCP: [name] Jeoffrey [name], [name]   Brief Narrative: 58 year old with past medical history significant for bipolar disorder type I, tardive dyskinesia, PTSD, MDD, GAD, diabetes type 2, hyperlipidemia, GERD, osteoarthritis, tremors who initially presented to the ED on 10/12 for evaluation of fall and chronic back pain.  Patient was previously living with a friend for the last 2 or 3 years, due to patient's recent bizarre behavior and concern for her own safety patient's friend does not wish for patient to return to her home.  TOC has been working on placement for patient but has been unsuccessful.  On 10/19 psychiatry was consulted for evaluation due to patient's endorsing suicidal thoughts, with a plan to hurt himself.  Patient was initially recommended for inpatient psychiatric admission however patient was ultimately cleared by psych on 10/22.  Patient has remained in the ED since then with multiple unsuccessful attempts at placement due to financial and behavioral barriers.  On the day of admission there was initial plan to discharge patient to extended stay hotel however on reevaluation, patient was found to be altered during the discharge process.  Per ED physician patient had acute change in his baseline mental status with concern for acute delirium.  Evaluation for delirium labs unremarkable no signs of infection, CT head with no acute intracranial abnormality.  Patient was  admitted for acute encephalopathy.  Patient was not able to tolerate MRI due to tremors and agitation.  MRI under sedation was performed on 11/4.  After procedure he became hypotensive, he received IV fluids, IV albumin and started on peripheral Levophed.  CCM consulted.  Subsequently he was transferred back to [facility] ICU   Assessment & Plan:   Principal Problem:   Acute encephalopathy Active Problems:   Chronic low back pain without  sciatica   Acute delirium   Generalized weakness   Bipolar 1 disorder (HCC)   1-Acute Metabolic Encephalopathy: - Patient with history of multiple psychiatric disorder, he was boarding in the ED for 2 weeks now had some acute changes in mental status. - CT head: No acute intracranial abnormality. -Dehydration, Ammonia less than 13, UA negative -MRI showed old right cerebral infarct , TSH;2.6 , B 12;573 , ABG: Hypoxia. , chest x ray. Hypoventilation.  -EEG negative He gets agitated at times and required haldol IM.  Thiamine level pending.  Started on IV thiamine.  Neurology consulted.   Acute hypoxic respiratory failure, could be related to hypoventilation but also undiagnosed sleep apnea. Keep on 2 L of oxygen Chest x-ray showed hypoventilation He needs oxygen at HS.   Hypotension:  Post procedure, suspect related to medications, versed, and precedex.  He  was transiently on Versed, to help with agitation while in PACU Received IV fluids, albumin.  Started on peripheral levophed.  CCM consulted, appreciate assistance.  Weaning levophed.   Bipolar 1 disorder Major depressive disorder GAD -Patient was cleared by psych initially while he was in the ED 10/22 - Continue benztropine , duloxetine , Depakote  and hydroxyzine  10/31: Psych consulted to assist adjusting medications.  Depakote  dose increased  Generalized weakness History of fall Chronic back pain -PT OT consulted  Dyskinesis Tremors -Continue with propranolol  and valbenazine   Diabetes type 2: With hypoglycemia Hold glipizide Hold Metformin .   Oral Ulcer: lower lip. Appears infected, he might have been biting lip.  Start Oral antibiotics,Augmentin and Doxy Magic mouth wash  Hypertension: Continue with lisinopril  Monitor renal function  Hyperlipidemia:  Continue with statins.   Vitamin D  deficiency: resume vitamin d    Hemoconcentration, Dehydration: Improved with IV fluids Hb on admission at 17---after  hydration down to 15.  Estimated body mass index is 27.65 kg/m as calculated from the following:   Height as of this encounter: 5' 10 (1.778 m).   Weight as of this encounter: 87.4 kg.   DVT prophylaxis: Lovenox  Code Status: Full code Family Communication: Disposition Plan:  Status is: Inpatient Remains inpatient appropriate because: management of encephalopathy     Consultants:  Psych   Procedures:  None  Antimicrobials:    Subjective: Seem in the ICU, sleepy. Events after MRI noted. Became Hypotensive, required levophed. He has been wean down to 2 L from 6 L while on PACU  Objective: Vitals:   03/13/24 1245 03/13/24 1250 03/13/24 1300 03/13/24 1315  BP: 104/69 108/72 112/74 120/76  Pulse: 63 64 65 61  Resp: 13 12 13 11   Temp:    97.6 F (36.4 C)  TempSrc:      SpO2: 97% 97% 97% 99%  Weight:      Height:        Intake/Output Summary (Last 24 hours) at 03/13/2024 1336 Last data filed at 03/13/2024 1030 Gross per 24 hour  Intake 2033.72 ml  Output --  Net 2033.72 ml   Filed Weights   03/07/24 2229  Weight: 87.4 kg    Examination:  General exam: NAD Respiratory system: CTA Cardiovascular system: S 1, S 2 RRR Gastrointestinal system:  BS present, soft,  nt Central nervous system: Yesterday he was not oriented to place time.  Extremities: no edema.    Data Reviewed: I have personally reviewed following labs and imaging studies  CBC: Recent Labs  Lab 03/07/24 1535 03/08/24 0508 03/09/24 0821 03/12/24 0429  WBC 7.9 8.5 6.4 5.9  NEUTROABS 4.2  --   --   --   HGB 17.7* 17.7* 15.9 15.2  HCT 53.4* 55.1* 47.9 47.6  MCV 85.9 87.0 86.0 86.5  PLT 316 333 260 222   Basic Metabolic Panel: Recent Labs  Lab 03/07/24 1535 03/08/24 0508 03/09/24 0821 03/12/24 0429  NA 137 136 138 139  K 4.5 4.6 4.2 3.9  CL 97* 98 101 101  CO2 28 22 25 28   GLUCOSE 99 60* 82 78  BUN 21* 22* 19 14  CREATININE 1.12 1.13 0.92 1.02  CALCIUM  10.2 10.3 9.6 9.7    GFR: Estimated Creatinine Clearance: 80.5 mL/min (by C-G formula based on SCr of 1.02 mg/dL). Liver Function Tests: Recent Labs  Lab 03/07/24 1535  AST 29  ALT 23  ALKPHOS 100  BILITOT 0.8  PROT 8.2*  ALBUMIN 4.6   No results for input(s): LIPASE, AMYLASE in the last 168 hours. Recent Labs  Lab 03/07/24 1535  AMMONIA <13   Coagulation Profile: No results for input(s): INR, PROTIME in the last 168 hours. Cardiac Enzymes: No results for input(s): CKTOTAL, CKMB, CKMBINDEX, TROPONINI in the last 168 hours. BNP (last 3 results) No results for input(s): PROBNP in the last 8760 hours. HbA1C: No results for input(s): HGBA1C in the last 72 hours. CBG: Recent Labs  Lab 03/12/24 0738 03/12/24 1135 03/12/24 1621 03/12/24 2047 03/13/24 1038  GLUCAP 79 95 96 97 91   Lipid Profile: No results for input(s): CHOL, HDL, LDLCALC, TRIG, CHOLHDL, LDLDIRECT in the last 72 hours. Thyroid  Function Tests: No results for input(s): TSH, T4TOTAL, FREET4, T3FREE, THYROIDAB in the last 72 hours.  Anemia Panel: No results  for input(s): VITAMINB12, FOLATE, FERRITIN, TIBC, IRON, RETICCTPCT in the last 72 hours.  Sepsis Labs: No results for input(s): PROCALCITON, LATICACIDVEN in the last 168 hours.  No results found for this or any previous visit (from the past 240 hours).       Radiology Studies: MR BRAIN WO CONTRAST Result Date: 03/13/2024 EXAM: MRI BRAIN WITHOUT CONTRAST 03/13/2024 10:40:22 AM TECHNIQUE: Multiplanar multisequence MRI of the head/brain was performed without the administration of intravenous contrast. COMPARISON: Head CT 03/07/2024. CLINICAL HISTORY: Altered mental status, nontraumatic (Ped 0-17y). FINDINGS: BRAIN AND VENTRICLES: There is no evidence of an acute infarct, intracranial hemorrhage, mass, midline shift, hydrocephalus, or extra-axial fluid collection. There is mild cerebral atrophy. Periventricular white  matter T2 hyperintensities are nonspecific but compatible with mild chronic small vessel ischemic disease. There is a small chronic inferior right cerebellar infarct. Major intracranial vascular flow voids are preserved. ORBITS: No acute abnormality. SINUSES AND MASTOIDS: Minimal mucosal thickening in the ethmoid sinuses. Trace bilateral mastoid fluid. BONES AND SOFT TISSUES: Normal marrow signal. No acute soft tissue abnormality. IMPRESSION: 1. No acute intracranial abnormality. 2. Mild chronic small vessel ischemic disease and cerebral atrophy. 3. Small chronic right cerebellar infarct. Electronically signed by: Dasie Hamburg MD 03/13/2024 11:44 AM EST RP Workstation: [id number]   DG Abd 1 View Result Date: 03/12/2024 EXAM: 1 VIEW XRAY OF THE ABDOMEN 03/12/2024 12:44:00 PM COMPARISON: Abdominal radiographs 09/03/2019. CLINICAL HISTORY: MRI clearance. FINDINGS: BOWEL: Gas and stool are present in the colon without dilated loops of bowel seen to suggest obstruction. SOFT TISSUES: No radiopaque foreign body is identified in the abdomen or included portion of the pelvis. A 1 cm calculus projecting over the lower pole of the left kidney is unchanged. BONES: No acute osseous abnormality. IMPRESSION: 1. No metallic foreign body. 2. Unchanged left renal calculus. Electronically signed by: Dasie Hamburg MD 03/12/2024 01:35 PM EST RP Workstation: [id number]         Scheduled Meds:  [MAR Hold] amoxicillin-clavulanate  1 tablet Oral Q12H   [MAR Hold] atorvastatin   20 mg Oral Daily   [MAR Hold] benztropine   1 mg Oral BID   [MAR Hold] divalproex   750 mg Oral BID   [MAR Hold] doxycycline   100 mg Oral Q12H   [MAR Hold] DULoxetine   40 mg Oral BID   [MAR Hold] enoxaparin  (LOVENOX ) injection  40 mg Subcutaneous Q24H   fentaNYL       [MAR Hold] hydrOXYzine   50 mg Oral QHS   [MAR Hold] insulin  aspart  0-6 Units Subcutaneous TID WC   [MAR Hold] lisinopril   20 mg Oral Daily   [MAR Hold] propranolol   10 mg Oral TID    [MAR Hold] valbenazine   80 mg Oral Daily   [MAR Hold] Vitamin D  (Ergocalciferol )  50,000 Units Oral Q7 days   Continuous Infusions:  sodium chloride      acetaminophen      lactated ringers  100 mL/hr at 03/12/24 0234   lactated ringers  10 mL/hr at 03/13/24 0905   phenylephrine (NEO-SYNEPHRINE) Adult infusion 25 mcg/min (03/13/24 1221)   sodium chloride        LOS: 6 days    Time spent: 35 Minutes    Anvith Mauriello A Clanton Emanuelson, MD [facility]   If 7PM-7AM, please contact night-coverage [email or web address]  03/13/2024, 1:36 PM

## 2024-03-13 NOTE — Consult Note (Addendum)
 NEUROLOGY CONSULT NOTE   Date of service: March 13, 2024 Patient Name: [name] [name] MRN:  [id number] DOB:  15-Jun-1965 Chief Complaint: Altered mental status Requesting Provider: Madelyne Owen LABOR, MD  History of Present Illness  [name] [name] is a 58 y.o. male with hx of bipolar disorder type I, tardive dyskinesia, PTSD, depression, generalized anxiety disorder, diabetes, hyperlipidemia, GERD, arthritis and tremors who initially presented to the ED on 10/12 after a fall with back pain.  He was later found to have suicidal ideation and was seen by Psychiatry.  He was cleared by Psychiatry on 10/22, but placement was unable to be found for him.  He has been followed by Psychiatry but has been found to be encephalopathic.   ROS   Unable to ascertain due to altered mental status  Past History   Past Medical History:  Diagnosis Date   Anxiety    Arthritis    Chest heaviness    Chills    Depression, major    Diabetes mellitus without complication (HCC)    Dizzy    Dyslipidemia    ED (erectile dysfunction)    Elevated CPK    GERD (gastroesophageal reflux disease)    HA (headache)    Hypertension    Hypertriglyceridemia    Insomnia    Kidney stone    Left-sided Bell's palsy    Morbid obesity (HCC)    Nausea    No appetite    Panniculitis    of right lower leg   Parkinson's disease (HCC)    PTSD (post-traumatic stress disorder)    Routine screening for STI (sexually transmitted infection) 01/05/2023   Weakness     Past Surgical History:  Procedure Laterality Date   NO PAST SURGERIES      Family History: Family History  Problem Relation Age of Onset   Thyroid  disease Brother    Depression Brother    Heart disease Mother    Anxiety disorder Mother    Kidney failure Father    Diabetes Father    Anxiety disorder Father    Thyroid  disease Sister    Mental illness Sister    Heart attack Maternal Grandfather    Heart failure Paternal Grandmother    Cancer  Paternal Grandfather        bone    Social History  reports that he has never smoked. He has never used smokeless tobacco. He reports that he does not drink alcohol and does not use drugs.  No Known Allergies  Medications   Current Facility-Administered Medications:    acetaminophen  (TYLENOL ) tablet 650 mg, 650 mg, Oral, Q6H PRN, 650 mg at 03/11/24 0808 **OR** acetaminophen  (TYLENOL ) suppository 650 mg, 650 mg, Rectal, Q6H PRN, Lou Claretta HERO, MD   amoxicillin-clavulanate (AUGMENTIN) 875-125 MG per tablet 1 tablet, 1 tablet, Oral, Q12H, Regalado, Belkys A, MD, 1 tablet at 03/12/24 2128   atorvastatin  (LIPITOR) tablet 20 mg, 20 mg, Oral, Daily, Mannie Pac T, DO, 20 mg at 03/12/24 0935   benztropine  (COGENTIN ) tablet 1 mg, 1 mg, Oral, BID, Mannie Pac T, DO, 1 mg at 03/12/24 2129   bisacodyl (DULCOLAX) EC tablet 5 mg, 5 mg, Oral, Daily PRN, Lou Claretta HERO, MD   divalproex  (DEPAKOTE ) DR tablet 750 mg, 750 mg, Oral, BID, Mitchell, Jerrell L, DO, 750 mg at 03/12/24 2128   doxycycline  (VIBRA -TABS) tablet 100 mg, 100 mg, Oral, Q12H, Regalado, Belkys A, MD, 100 mg at 03/12/24 2129   DULoxetine  (CYMBALTA ) DR capsule 40 mg,  40 mg, Oral, BID, Mannie Pac T, DO, 40 mg at 03/12/24 2128   enoxaparin  (LOVENOX ) injection 40 mg, 40 mg, Subcutaneous, Q24H, Lou, Claretta HERO, MD, 40 mg at 03/13/24 1455   fentaNYL (SUBLIMAZE) 100 MCG/2ML injection, , , ,    haloperidol (HALDOL) tablet 5 mg, 5 mg, Oral, Q8H PRN **OR** haloperidol lactate (HALDOL) injection 5 mg, 5 mg, Intramuscular, Q8H PRN, Akintayo, Musa A, MD, 5 mg at 03/13/24 0803   hydrOXYzine  (ATARAX ) tablet 50 mg, 50 mg, Oral, QHS, Regalado, Belkys A, MD, 50 mg at 03/12/24 2129   insulin  aspart (novoLOG ) injection 0-6 Units, 0-6 Units, Subcutaneous, TID WC, Regalado, Belkys A, MD, 1 Units at 03/11/24 1205   lactated ringers  infusion, , Intravenous, Continuous, Regalado, Belkys A, MD, Last Rate: 100 mL/hr at 03/12/24 0234, New Bag  at 03/12/24 0234   lip balm (CARMEX) ointment, , Topical, PRN, Regalado, Belkys A, MD, 1 Application at 03/12/24 0934   lisinopril  (ZESTRIL ) tablet 20 mg, 20 mg, Oral, Daily, Mannie Pac T, DO, 20 mg at 03/12/24 0935   ondansetron (ZOFRAN) tablet 4 mg, 4 mg, Oral, Q6H PRN **OR** ondansetron (ZOFRAN) injection 4 mg, 4 mg, Intravenous, Q6H PRN, Lou, Claretta HERO, MD   propranolol  (INDERAL ) tablet 10 mg, 10 mg, Oral, TID, Mannie Pac T, DO, 10 mg at 03/12/24 2129   senna-docusate (Senokot-S) tablet 1 tablet, 1 tablet, Oral, QHS PRN, Lou Claretta HERO, MD   traZODone (DESYREL) tablet 50 mg, 50 mg, Oral, QHS PRN, Jacquetta Sharlot GRADE, NP, 50 mg at 03/12/24 2129   valbenazine  (INGREZZA ) capsule 80 mg, 80 mg, Oral, Daily, Mannie Pac T, DO, 80 mg at 03/12/24 0935   Vitamin D  (Ergocalciferol ) (DRISDOL) 1.25 MG (50000 UNIT) capsule 50,000 Units, 50,000 Units, Oral, Q7 days, Regalado, Belkys A, MD, 50,000 Units at 03/08/24 0957  Vitals   Vitals:   03/13/24 1250 03/13/24 1300 03/13/24 1315 03/13/24 1442  BP: 108/72 112/74 120/76   Pulse: 64 65 61   Resp: 12 13 11    Temp:   97.6 F (36.4 C)   TempSrc:      SpO2: 97% 97% 99% 99%  Weight:      Height:        Body mass index is 27.65 kg/m.   Physical Exam   Constitutional: Chronically ill-appearing, middle-aged patient in no acute distress Psych: Affect blunted with some irritability Eyes: No scleral injection.  HENT: No OP obstruction.  Head: Normocephalic.  Cardiovascular: Normal rate and regular rhythm.  Respiratory: Effort normal, non-labored breathing on supplemental O2 Skin: WDI.   Neurologic Examination  Patient's cooperation with exam was limited  Mental Status: With initial NP exam, patient does not respond to name or follow commands.  He will localize sternal rub with right upper extremity.  He will not answer questions, but at 1 point he did tell examiner to leave him alone and said agitated, hell when RN stated he  had been agitated earlier in the day. With follow up Neurology attending exam at 9:30 PM, several additional hours having elapsed to allow for metabolism of his sedation, the patient initially seems reluctant to participate, but after about a minute, he will open his eyes, track, requests to urinate with a short, fluent phrase, can state that his neck and low back are hurting, follows command to flex his neck with about 2 inches of excursion, express discomfort when examiner passively rotates his neck to the right and left, express that his knees are painful when examiner  attempts leg maneuvers to test for Kernig's sign bilaterally, and will give incorrect answers when asked for the day of the week, the month and the year. He is unable or unwilling to state what city or state he is in. He can speak in short, full sentences. He can count fingers but does not participate well with naming tasks.  Cranial Nerves:  II: PERRL.  III, IV, VI: Does not move eyes to command and rests with eyes closed, so it is difficult to tell if he is tracking VII: Face is symmetrical resting and speaking. Hypomimia is noted.  VIII: hearing intact to voice. IX, X: Phonation is normal.  XII: Not cooperative with tongue protrusion Motor: Moves bilateral upper extremities with antigravity strength, wiggles bilateral feet but does not lift lower extremities off the bed Tone is normal and bulk is normal Spontaneous gross resting tremor to LUE > RUE as well as to BLE is elicited when patient slowly drinks apple juice from a straw that is held in position for him. Tone is with mild rigidity in all 4 extremities.  Sensation- Grossly intact to light touch Coordination: Unable/unwilling to perform Gait- Deferred  Patient's neck does appear to be stiff, but he may be intentionally resisting neck motion by examiner  Labs/Imaging/Neurodiagnostic studies   CBC:  Recent Labs  Lab 03/29/24 1535 03/08/24 0508 03/12/24 0429  03/13/24 1442  WBC 7.9   < > 5.9 6.1  NEUTROABS 4.2  --   --   --   HGB 17.7*   < > 15.2 14.1  HCT 53.4*   < > 47.6 44.5  MCV 85.9   < > 86.5 87.6  PLT 316   < > 222 213   < > = values in this interval not displayed.   Basic Metabolic Panel:  Lab Results  Component Value Date   NA 137 03/13/2024   K 4.1 03/13/2024   CO2 26 03/13/2024   GLUCOSE 85 03/13/2024   BUN 15 03/13/2024   CREATININE 0.87 03/13/2024   CALCIUM  9.1 03/13/2024   GFRNONAA >60 03/13/2024   GFRAA >60 12/26/2019   Lipid Panel:  Lab Results  Component Value Date   LDLCALC 124 (H) 12/08/2023   HgbA1c:  Lab Results  Component Value Date   HGBA1C 6.3 (H) 12/08/2023   Urine Drug Screen:     Component Value Date/Time   LABOPIA NONE DETECTED 02/25/2024 2330   COCAINSCRNUR NONE DETECTED 02/25/2024 2330   LABBENZ NONE DETECTED 02/25/2024 2330   AMPHETMU NONE DETECTED 02/25/2024 2330   THCU NONE DETECTED 02/25/2024 2330   LABBARB NONE DETECTED 02/25/2024 2330    Alcohol Level     Component Value Date/Time   [facility] <15 02/25/2024 2100   INR  Lab Results  Component Value Date   INR 1.00 10/13/2016   APTT  Lab Results  Component Value Date   APTT 25 10/13/2016    CT Head without contrast(Personally reviewed): No acute abnormalities  MRI Brain(Personally reviewed): No acute abnormalities  Neurodiagnostics rEEG: Continuous slow, generalized. This study is suggestive of generalized cerebral dysfunction (encephalopathy). No seizures or epileptiform discharges were seen throughout the recording.   ASSESSMENT  [name] [name] is a 58 y.o. male with hx of bipolar disorder type I, tardive dyskinesia, PTSD, depression, generalized anxiety disorder, diabetes, hyperlipidemia, GERD, arthritis and tremors who originally presented after a fall with back pain, verbalized suicidal ideation, subsequently was cleared by psychiatry and is now found to be encephalopathic.   - Exam:  He did undergo MRI under sedation  today, and this is likely influencing his exam today.  Patient's cooperation with exam is also limited.  He does appear to have some neck stiffness, this may be due to intentional flexing of muscles during exam, and he does not have a fever or leukocytosis.   - CT head and MRI brain have demonstrated no acute abnormalities.  - Routine EEG demonstrates continuous slowing, which provides objective evidence for a toxic/metabolic or other encephalopathy.   - Labs:  - TSH normal - B12 normal - Vitamin D  levels in April of 2024 (28.1) and July of this year (27) were low. Deficiency can result in cognitive symptoms - Ammonia normal - AST and ALT normal - Impression: - He is taking Ingrezza , which can cause drowsiness, but he has been on this medication since prior to his initial admission on 10/12, so doubt this is the sole reason for his encephalopathy. - He has been afebrile and does not have a leukocytosis, so infectious etiology of his symptoms is unlikely.   - Neck stiffness on exam is possibly of concern, but in the absence of other signs of infection, would not perform lumbar puncture at this time from a risk/benefit standpoint.  - Could consider LTM EEG as well, as subclinical seizure activity not caught on initial EEG could explain patient's symptoms. Although this is low on the DDx   - It is also very likely that the patient's primary psychiatric illness is influencing his current presentation. - Overall, his attention and interactions are somewhat selective, his speech becoming significantly clearer and with longer sentences when expressing his needs. He is relatively recalcitrant to questions and commands that don't pertain to his immediate needs (pain, being thirsty and requesting assistance to urinate). Several of his findings are consistent with abulia, which may be due to his Parkinsonism in conjunction with his Psychiatric comorbidity. He does seem withdrawn and depressed, which raises the  question of a possible pseudodementia. He does not appear to be delirious.   RECOMMENDATIONS  - Start Vitamin D  supplementation -- Consider lumbar puncture if neck stiffness persists when patient is cooperative with exam, although on follow up exam this evening, his decreased neck mobility seems most likely to be secondary to combined chronic neck pain and muscle rigidity in the setting of his Parkinsonism.   - Consider repeat EEG tomorrow and possibly a 3rd. If no epileptiform activity on 3 successive EEGs, the probability of subclinical seizures as the etiology for patient's symptoms would be low enough not to require further EEG recordings.  - Psychiatry has been consulted, appreciate recommendations - May benefit from an outpatient dementia evaluation.  - Neurology service will follow PRN. Please call if there are further questions.   ______________________________________________________________________  Patient seen by NP and then by MD.  Signed, Cortney E Everitt Clint Kill, NP [facility]   I have seen and examined the patient. I have formulated the assessment and recommendations. 58 year old male with mild diffuse encephalopathy clinically, confirmed with EEG which shows diffuse slowing. He is poorly cooperative with exam and is also sedated at this time; no gross lateralizing deficit is noted. Recommendations as above.  Electronically signed: Dr. Ruchi Stoney

## 2024-03-13 NOTE — Anesthesia Procedure Notes (Signed)
 Procedure Name: Intubation Date/Time: 03/13/2024 9:53 AM  Performed by: Arvell Edsel HERO, CRNAPre-anesthesia Checklist: Patient identified, Emergency Drugs available, Suction available and Patient being monitored Patient Re-evaluated:Patient Re-evaluated prior to induction Oxygen Delivery Method: Circle system utilized Preoxygenation: Pre-oxygenation with 100% oxygen Induction Type: IV induction Ventilation: Mask ventilation without difficulty Laryngoscope Size: Glidescope and 4 Grade View: Grade I Tube size: 7.0 mm Number of attempts: 1 Airway Equipment and Method: Video-laryngoscopy and Patient positioned with wedge pillow Placement Confirmation: ETT inserted through vocal cords under direct vision, positive ETCO2 and breath sounds checked- equal and bilateral Secured at: 23 cm Tube secured with: Tape Dental Injury: Teeth and Oropharynx as per pre-operative assessment

## 2024-03-13 NOTE — Progress Notes (Signed)
 Staff with [facility] called requesting a brief report on the pt. Nurse provided them with this information and that a safety sitter has been with him. She said okay and that they will be at [facility] to pick the pt up around 7:45am today, to take to [facility] for MRI.

## 2024-03-13 NOTE — Progress Notes (Signed)
 PT Cancellation Note  Patient Details Name: [name] [name] MRN: [id number] DOB: 30-May-1965   Cancelled Treatment:    Reason Eval/Treat Not Completed: Patient at procedure or test/unavailable. Pt at [facility] for MRI. Will continue to follow for PT treatment as schedule permits.    Tori Mariah Harn PT, DPT 03/13/24, 11:23 AM

## 2024-03-14 ENCOUNTER — Encounter (HOSPITAL_COMMUNITY): Payer: Self-pay | Admitting: Radiology

## 2024-03-14 DIAGNOSIS — E119 Type 2 diabetes mellitus without complications: Secondary | ICD-10-CM | POA: Diagnosis not present

## 2024-03-14 DIAGNOSIS — E785 Hyperlipidemia, unspecified: Secondary | ICD-10-CM

## 2024-03-14 DIAGNOSIS — F319 Bipolar disorder, unspecified: Secondary | ICD-10-CM | POA: Diagnosis not present

## 2024-03-14 DIAGNOSIS — I959 Hypotension, unspecified: Secondary | ICD-10-CM | POA: Diagnosis not present

## 2024-03-14 DIAGNOSIS — R410 Disorientation, unspecified: Secondary | ICD-10-CM | POA: Diagnosis not present

## 2024-03-14 DIAGNOSIS — G9340 Encephalopathy, unspecified: Secondary | ICD-10-CM | POA: Diagnosis not present

## 2024-03-14 DIAGNOSIS — M545 Low back pain, unspecified: Secondary | ICD-10-CM | POA: Diagnosis not present

## 2024-03-14 LAB — CBC
HCT: 45.7 % (ref 39.0–52.0)
Hemoglobin: 14.6 g/dL (ref 13.0–17.0)
MCH: 28.4 pg (ref 26.0–34.0)
MCHC: 31.9 g/dL (ref 30.0–36.0)
MCV: 88.9 fL (ref 80.0–100.0)
Platelets: 182 K/uL (ref 150–400)
RBC: 5.14 MIL/uL (ref 4.22–5.81)
RDW: 13.6 % (ref 11.5–15.5)
WBC: 4.8 K/uL (ref 4.0–10.5)
nRBC: 0 % (ref 0.0–0.2)

## 2024-03-14 LAB — GLUCOSE, CAPILLARY
Glucose-Capillary: 87 mg/dL (ref 70–99)
Glucose-Capillary: 108 mg/dL — ABNORMAL HIGH (ref 70–99)
Glucose-Capillary: 154 mg/dL — ABNORMAL HIGH (ref 70–99)

## 2024-03-14 LAB — BASIC METABOLIC PANEL WITH GFR
Anion gap: 8 (ref 5–15)
BUN: 12 mg/dL (ref 6–20)
CO2: 28 mmol/L (ref 22–32)
Calcium: 9.3 mg/dL (ref 8.9–10.3)
Chloride: 102 mmol/L (ref 98–111)
Creatinine, Ser: 0.8 mg/dL (ref 0.61–1.24)
GFR, Estimated: >60 mL/min (ref >60)
Glucose, Bld: 83 mg/dL (ref 70–99)
Potassium: 3.9 mmol/L (ref 3.5–5.1)
Sodium: 138 mmol/L (ref 135–145)

## 2024-03-14 MED ORDER — POLYVINYL ALCOHOL 1.4 % OP SOLN
1.0000 [drp] | OPHTHALMIC | Status: DC | PRN
  Filled 2024-03-14: qty 15

## 2024-03-14 MED ORDER — SODIUM CHLORIDE 0.9 % IV SOLN: 25.0000 mg | Freq: Four times a day (QID) | INTRAVENOUS | Status: DC | PRN

## 2024-03-14 NOTE — Plan of Care (Signed)

## 2024-03-14 NOTE — Consult Note (Signed)
 [facility] Psychiatric Consult Follow Up  Patient Name: .KANNEN MOXEY  MRN: [id number]  DOB: Oct 16, 1965  Consult Order details:  Orders (From admission, onward)     Start     Ordered   03/08/24 0958  IP CONSULT TO PSYCHIATRY       Ordering Provider: Madelyne Owen LABOR, MD  Provider:  (Not yet assigned)  Question Answer Comment  Location [facility]   Reason for Consult? adjustment of medications , admitted with delirium      03/08/24 0957             Mode of Visit: In person    Psychiatry Consult Evaluation  Service Date: March 14, 2024 LOS:  LOS: 7 days  Chief Complaint Delirium  Primary Psychiatric Diagnoses  Bipolar I Disorder 2.  Delirium  Assessment  TORBEN SOLOWAY is a 58 y.o. male admitted: Medicallyfor 03/02/2024  3:27 PM for Acute Encephalopathy. He carries the psychiatric diagnoses of Bipolar I Disorder.  On initial examination, patient can be seen laying in bed with sitter at bedside. The interview is complicated by the patient's lethargy and being in and out of consciousness. He was oriented to person, place, and time but disoriented to situation. He was unable to report why he was currently in the hospital. Per the sitter at bedside she was informed that the patient had a history of aggression but he had behaved well this morning. He was eating fine and the plan was to make sure that he remained awake during the day shift. The patient denied any SI/HI/AVH or paranoid thoughts about the staff. He stated that he was unsure where he would be able to go once he was discharged from the hospital. Please see plan below for detailed recommendations.   03/10/2024: Patient seen face to face in his hospital room. He is awake and alert but appears confused and disoriented to time, place, person and situation. He could not identify any familiar objects including pen and wrist watch. When asked how he's doing today, patient could not participate in any  meaningful conversation. Collateral information from the 1:1 staff at bedside indicates patient was irritated earlier today requiring soft restraints but denies aggression or agitation towards staff. We will continue to monitor patient for improvement.    03/11/2024: The client was calm since yesterday am per the sitter at the bedside.  He was sitting in his recliner eating breakfast in no distress, reported feeling pretty good.  He does hear and see his dog at times which is comforting to him, not disturbing him.  Appetite is good.  Last night, he barely slept per the sitter.   PRN sleep medication added.  03/14/2024: The client was calmly finishing his breakfast on assessment.  No behavior issues noted except prior to the recent MRI per notes.  Laronn stated he slept pretty good last night.  Appetite is good.  Mood is calm with no depression.  He does want to go home.  Please consult as needed.  Diagnoses:  Active Hospital problems: Principal Problem:   Acute encephalopathy Active Problems:   Chronic low back pain without sciatica   Acute delirium   Generalized weakness   Bipolar 1 disorder (HCC)    Plan   ## Psychiatric Medication Recommendations:  -Continue Cymbalta  40 mg PO BID -Continue Depakote  750 mg PO BID -Continue Hydroxyzine  50 mg at bedtime for sleep.  -Please avoid Valium  or any other Benzodiazepine due to risk of disinhibition and worsening  confusion  -Give Haldol 5 mg PO/IM Q 8 HR PRN for acute agitation -Added Trazodone 50 mg at bedtime PRN sleep  ## Medical Decision Making Capacity: Not specifically addressed in this encounter  EKG on 10/24 of 474  ## Further Work-up:  -- Per primary team -- Pertinent labwork reviewed earlier this admission includes:  Recent Results (from the past 2160 hours)  Urinalysis, Routine w reflex microscopic -Urine, Clean Catch     Status: None   Collection Time: 02/19/24  7:48 AM  Result Value Ref Range   Color, Urine  YELLOW YELLOW   APPearance CLEAR CLEAR   Specific Gravity, Urine 1.018 1.005 - 1.030   pH 6.0 5.0 - 8.0   Glucose, UA NEGATIVE NEGATIVE mg/dL   Hgb urine dipstick NEGATIVE NEGATIVE   Bilirubin Urine NEGATIVE NEGATIVE   Ketones, ur NEGATIVE NEGATIVE mg/dL   Protein, ur NEGATIVE NEGATIVE mg/dL   Nitrite NEGATIVE NEGATIVE   Leukocytes,Ua NEGATIVE NEGATIVE    Comment: Performed at [facility], [address] [place], [place] [address]  CBC with Differential/Platelet     Status: None   Collection Time: 02/19/24  8:24 AM  Result Value Ref Range   WBC 8.4 4.0 - 10.5 K/uL   RBC 5.03 4.22 - 5.81 MIL/uL   Hemoglobin 14.2 13.0 - 17.0 g/dL   HCT 57.1 60.9 - 47.9 %   MCV 85.1 80.0 - 100.0 fL   MCH 28.2 26.0 - 34.0 pg   MCHC 33.2 30.0 - 36.0 g/dL   RDW 85.9 88.4 - 84.4 %   Platelets 217 150 - 400 K/uL   nRBC 0.0 0.0 - 0.2 %   Neutrophils Relative % 54 %   Neutro Abs 4.5 1.7 - 7.7 K/uL   Lymphocytes Relative 33 %   Lymphs Abs 2.8 0.7 - 4.0 K/uL   Monocytes Relative 11 %   Monocytes Absolute 0.9 0.1 - 1.0 K/uL   Eosinophils Relative 1 %   Eosinophils Absolute 0.0 0.0 - 0.5 K/uL   Basophils Relative 1 %   Basophils Absolute 0.1 0.0 - 0.1 K/uL   Immature Granulocytes 0 %   Abs Immature Granulocytes 0.03 0.00 - 0.07 K/uL    Comment: Performed at [facility], [address] [place], [place] [address]  Comprehensive metabolic panel with GFR     Status: Abnormal   Collection Time: 02/19/24  8:24 AM  Result Value Ref Range   Sodium 136 135 - 145 mmol/L   Potassium 4.1 3.5 - 5.1 mmol/L   Chloride 99 98 - 111 mmol/L   CO2 26 22 - 32 mmol/L   Glucose, Bld 122 (H) 70 - 99 mg/dL    Comment: Glucose reference range applies only to samples taken after fasting for at least 8 hours.   BUN 19 6 - 20 mg/dL   Creatinine, Ser 8.95 0.61 - 1.24 mg/dL   Calcium  9.8 8.9 - 10.3 mg/dL   Total Protein 6.9 6.5 - 8.1 g/dL   Albumin 4.2 3.5 - 5.0 g/dL   AST 17 15 - 41  U/L   ALT 10 0 - 44 U/L   Alkaline Phosphatase 49 38 - 126 U/L   Total Bilirubin 0.5 0.0 - 1.2 mg/dL   GFR, Estimated >39 >39 mL/min    Comment: (NOTE) Calculated using the CKD-EPI Creatinine Equation (2021)    Anion gap 11 5 - 15    Comment: Performed at [facility], [address] [place],  [place] [address]  Comprehensive metabolic panel     Status: Abnormal   Collection Time: 02/25/24  7:59 PM  Result Value Ref Range   Sodium 135 135 - 145 mmol/L   Potassium 4.2 3.5 - 5.1 mmol/L   Chloride 99 98 - 111 mmol/L   CO2 23 22 - 32 mmol/L   Glucose, Bld 106 (H) 70 - 99 mg/dL    Comment: Glucose reference range applies only to samples taken after fasting for at least 8 hours.   BUN 17 6 - 20 mg/dL   Creatinine, Ser 8.80 0.61 - 1.24 mg/dL   Calcium  9.8 8.9 - 10.3 mg/dL   Total Protein 7.3 6.5 - 8.1 g/dL   Albumin 3.9 3.5 - 5.0 g/dL   AST 18 15 - 41 U/L   ALT 17 0 - 44 U/L   Alkaline Phosphatase 57 38 - 126 U/L   Total Bilirubin 1.2 0.0 - 1.2 mg/dL   GFR, Estimated >39 >39 mL/min    Comment: (NOTE) Calculated using the CKD-EPI Creatinine Equation (2021)    Anion gap 13 5 - 15    Comment: Performed at [facility], [address], [place], [place] [address]  CBC     Status: None   Collection Time: 02/25/24  7:59 PM  Result Value Ref Range   WBC 9.7 4.0 - 10.5 K/uL   RBC 5.73 4.22 - 5.81 MIL/uL   Hemoglobin 16.4 13.0 - 17.0 g/dL   HCT 51.6 60.9 - 47.9 %   MCV 84.3 80.0 - 100.0 fL   MCH 28.6 26.0 - 34.0 pg   MCHC 34.0 30.0 - 36.0 g/dL   RDW 85.9 88.4 - 84.4 %   Platelets 288 150 - 400 K/uL   nRBC 0.0 0.0 - 0.2 %    Comment: Performed at [facility], [address], [place], [place] [address]  CBG monitoring, ED     Status: Abnormal   Collection Time: 02/25/24  8:01 PM  Result Value Ref Range   Glucose-Capillary 102 (H) 70 - 99 mg/dL    Comment: Glucose reference range applies only to samples taken after fasting for at least 8 hours.   Ammonia     Status: None   Collection Time: 02/25/24  8:33 PM  Result Value Ref Range   Ammonia 34 9 - 35 umol/L    Comment: HEMOLYSIS AT THIS LEVEL MAY AFFECT RESULT Performed at [facility], [address] [place], [place] [address]   Resp panel by RT-PCR (RSV, Flu A&B, Covid) Anterior Nasal Swab     Status: None   Collection Time: 02/25/24  8:45 PM   Specimen: Anterior Nasal Swab  Result Value Ref Range   SARS Coronavirus 2 by RT PCR NEGATIVE NEGATIVE   Influenza A by PCR NEGATIVE NEGATIVE   Influenza B by PCR NEGATIVE NEGATIVE    Comment: (NOTE) The Xpert Xpress SARS-CoV-2/FLU/RSV plus assay is intended as an aid in the diagnosis of influenza from Nasopharyngeal swab specimens and should not be used as a sole basis for treatment. Nasal washings and aspirates are unacceptable for Xpert Xpress SARS-CoV-2/FLU/RSV testing.  Fact Sheet for Patients: bloggercourse.com  Fact Sheet for Healthcare Providers: seriousbroker.it  This test is not yet approved or cleared by the [place]  FDA and has been authorized for detection and/or diagnosis of SARS-CoV-2 by FDA under an Emergency Use Authorization (EUA). This EUA will remain in effect (meaning this test can be used) for the  duration of the COVID-19 declaration under Section 564(b)(1) of the Act, 21 U.S.C. section 360bbb-3(b)(1), unless the authorization is terminated or revoked.     Resp Syncytial Virus by PCR NEGATIVE NEGATIVE    Comment: (NOTE) Fact Sheet for Patients: bloggercourse.com  Fact Sheet for Healthcare Providers: seriousbroker.it  This test is not yet approved or cleared by the [place]  FDA and has been authorized for detection and/or diagnosis of SARS-CoV-2 by FDA under an Emergency Use Authorization (EUA). This EUA will remain in effect (meaning this test can be used) for the duration of  the COVID-19 declaration under Section 564(b)(1) of the Act, 21 U.S.C. section 360bbb-3(b)(1), unless the authorization is terminated or revoked.  Performed at [facility], [address], [place], [place] [address]   Ethanol     Status: None   Collection Time: 02/25/24  9:00 PM  Result Value Ref Range   Alcohol, Ethyl (B) <15 <15 mg/dL    Comment: (NOTE) For medical purposes only. Performed at [facility], [address], [place], [place] [address]   Urinalysis, Routine w reflex microscopic -Urine, Clean Catch     Status: Abnormal   Collection Time: 02/25/24 11:30 PM  Result Value Ref Range   Color, Urine YELLOW YELLOW   APPearance CLEAR CLEAR   Specific Gravity, Urine 1.018 1.005 - 1.030   pH 5.0 5.0 - 8.0   Glucose, UA NEGATIVE NEGATIVE mg/dL   Hgb urine dipstick NEGATIVE NEGATIVE   Bilirubin Urine NEGATIVE NEGATIVE   Ketones, ur 5 (A) NEGATIVE mg/dL   Protein, ur NEGATIVE NEGATIVE mg/dL   Nitrite NEGATIVE NEGATIVE   Leukocytes,Ua NEGATIVE NEGATIVE    Comment: Performed at [facility], [address], [place], [place] [address]  Rapid urine drug screen (hospital performed)     Status: None   Collection Time: 02/25/24 11:30 PM  Result Value Ref Range   Opiates NONE DETECTED NONE DETECTED   Cocaine NONE DETECTED NONE DETECTED   Benzodiazepines NONE DETECTED NONE DETECTED   Amphetamines NONE DETECTED NONE DETECTED   Tetrahydrocannabinol NONE DETECTED NONE DETECTED   Barbiturates NONE DETECTED NONE DETECTED    Comment: (NOTE) DRUG SCREEN FOR MEDICAL PURPOSES ONLY.  IF CONFIRMATION IS NEEDED FOR ANY PURPOSE, NOTIFY LAB WITHIN 5 DAYS.  LOWEST DETECTABLE LIMITS FOR URINE DRUG SCREEN Drug Class                     Cutoff (ng/mL) Amphetamine and metabolites    1000 Barbiturate and metabolites    200 Benzodiazepine                 200 Opiates and metabolites        300 Cocaine and metabolites        300 THC                            50 Performed  at [facility], [address], [place], [place] [address]   Basic metabolic panel     Status: Abnormal   Collection Time: 03/02/24  5:33 PM  Result Value Ref Range   Sodium 135 135 - 145 mmol/L   Potassium 4.5 3.5 - 5.1 mmol/L   Chloride 97 (L) 98 - 111 mmol/L   CO2 28 22 - 32 mmol/L   Glucose, Bld 98 70 - 99 mg/dL    Comment: Glucose reference range applies only to samples taken  after fasting for at least 8 hours.   BUN 18 6 - 20 mg/dL   Creatinine, Ser 8.86 0.61 - 1.24 mg/dL   Calcium  10.0 8.9 - 10.3 mg/dL   GFR, Estimated >39 >39 mL/min    Comment: (NOTE) Calculated using the CKD-EPI Creatinine Equation (2021)    Anion gap 11 5 - 15    Comment: Performed at [facility], [address], [place], [place] [address]  CBC with Differential     Status: Abnormal   Collection Time: 03/02/24  5:33 PM  Result Value Ref Range   WBC 8.7 4.0 - 10.5 K/uL   RBC 6.05 (H) 4.22 - 5.81 MIL/uL   Hemoglobin 17.1 (H) 13.0 - 17.0 g/dL   HCT 48.0 60.9 - 47.9 %   MCV 85.8 80.0 - 100.0 fL   MCH 28.3 26.0 - 34.0 pg   MCHC 32.9 30.0 - 36.0 g/dL   RDW 86.2 88.4 - 84.4 %   Platelets 313 150 - 400 K/uL   nRBC 0.0 0.0 - 0.2 %   Neutrophils Relative % 56 %   Neutro Abs 5.0 1.7 - 7.7 K/uL   Lymphocytes Relative 32 %   Lymphs Abs 2.8 0.7 - 4.0 K/uL   Monocytes Relative 9 %   Monocytes Absolute 0.8 0.1 - 1.0 K/uL   Eosinophils Relative 1 %   Eosinophils Absolute 0.1 0.0 - 0.5 K/uL   Basophils Relative 1 %   Basophils Absolute 0.1 0.0 - 0.1 K/uL   Immature Granulocytes 1 %   Abs Immature Granulocytes 0.04 0.00 - 0.07 K/uL    Comment: Performed at [facility], [address], [place], [place] [address]  CBG monitoring, ED     Status: Abnormal   Collection Time: 03/03/24  9:06 AM  Result Value Ref Range   Glucose-Capillary 113 (H) 70 - 99 mg/dL    Comment: Glucose reference range applies only to samples taken after fasting for at least 8 hours.  Ammonia      Status: None   Collection Time: 03/07/24  3:35 PM  Result Value Ref Range   Ammonia <13 9 - 35 umol/L    Comment: Performed at [facility], [address], [place], [place] [address]  CBC with Differential     Status: Abnormal   Collection Time: 03/07/24  3:35 PM  Result Value Ref Range   WBC 7.9 4.0 - 10.5 K/uL   RBC 6.22 (H) 4.22 - 5.81 MIL/uL   Hemoglobin 17.7 (H) 13.0 - 17.0 g/dL   HCT 46.5 (H) 60.9 - 47.9 %   MCV 85.9 80.0 - 100.0 fL   MCH 28.5 26.0 - 34.0 pg   MCHC 33.1 30.0 - 36.0 g/dL   RDW 86.2 88.4 - 84.4 %   Platelets 316 150 - 400 K/uL   nRBC 0.0 0.0 - 0.2 %   Neutrophils Relative % 53 %   Neutro Abs 4.2 1.7 - 7.7 K/uL   Lymphocytes Relative 34 %   Lymphs Abs 2.7 0.7 - 4.0 K/uL   Monocytes Relative 10 %   Monocytes Absolute 0.8 0.1 - 1.0 K/uL   Eosinophils Relative 2 %   Eosinophils Absolute 0.1 0.0 - 0.5 K/uL   Basophils Relative 1 %   Basophils Absolute 0.1 0.0 - 0.1 K/uL   Immature Granulocytes 0 %   Abs Immature Granulocytes 0.02 0.00 - 0.07 K/uL    Comment: Performed at [facility], [address] [place], [place] [address]  Comprehensive metabolic panel     Status: Abnormal   Collection Time: 03/07/24  3:35 PM  Result Value Ref Range   Sodium 137 135 - 145 mmol/L   Potassium 4.5 3.5 - 5.1 mmol/L   Chloride 97 (L) 98 - 111 mmol/L   CO2 28 22 - 32 mmol/L   Glucose, Bld 99 70 - 99 mg/dL    Comment: Glucose reference range applies only to samples taken after fasting for at least 8 hours.   BUN 21 (H) 6 - 20 mg/dL   Creatinine, Ser 8.87 0.61 - 1.24 mg/dL   Calcium  10.2 8.9 - 10.3 mg/dL   Total Protein 8.2 (H) 6.5 - 8.1 g/dL   Albumin 4.6 3.5 - 5.0 g/dL   AST 29 15 - 41 U/L   ALT 23 0 - 44 U/L   Alkaline Phosphatase 100 38 - 126 U/L   Total Bilirubin 0.8 0.0 - 1.2 mg/dL   GFR, Estimated >39 >39 mL/min    Comment: (NOTE) Calculated using the CKD-EPI Creatinine Equation (2021)    Anion gap 12 5 - 15     Comment: Performed at [facility], [address] [place], [place] [address]  Urinalysis, w/ Reflex to Culture (Infection Suspected) -Urine, Clean Catch     Status: Abnormal   Collection Time: 03/07/24  8:22 PM  Result Value Ref Range   Specimen Source URINE, CLEAN CATCH    Color, Urine YELLOW YELLOW   APPearance CLEAR CLEAR   Specific Gravity, Urine 1.024 1.005 - 1.030   pH 6.0 5.0 - 8.0   Glucose, UA NEGATIVE NEGATIVE mg/dL   Hgb urine dipstick NEGATIVE NEGATIVE   Bilirubin Urine NEGATIVE NEGATIVE   Ketones, ur 5 (A) NEGATIVE mg/dL   Protein, ur NEGATIVE NEGATIVE mg/dL   Nitrite NEGATIVE NEGATIVE   Leukocytes,Ua TRACE (A) NEGATIVE   RBC / HPF 0-5 0 - 5 RBC/hpf   WBC, UA 0-5 0 - 5 WBC/hpf    Comment:        Reflex urine culture not performed if WBC <=10, OR if Squamous epithelial cells >5. If Squamous epithelial cells >5 suggest recollection.    Bacteria, UA RARE (A) NONE SEEN   Squamous Epithelial / HPF 0-5 0 - 5 /HPF   Mucus PRESENT     Comment: Performed at [facility], [address], [place], [place] [address]  HIV Antibody (routine testing w rflx)     Status: None   Collection Time: 03/08/24  5:08 AM  Result Value Ref Range   HIV Screen 4th Generation wRfx Non Reactive Non Reactive    Comment: Performed at [facility], [address], [place], [place] [address]  Basic metabolic panel     Status: Abnormal   Collection Time: 03/08/24  5:08 AM  Result Value Ref Range   Sodium 136 135 - 145 mmol/L   Potassium 4.6 3.5 - 5.1 mmol/L   Chloride 98 98 - 111 mmol/L   CO2 22 22 - 32 mmol/L   Glucose, Bld 60 (L) 70 - 99 mg/dL    Comment: Glucose reference range applies only to samples taken after fasting for at least 8 hours.   BUN 22 (H) 6 - 20 mg/dL   Creatinine, Ser 8.86 0.61 - 1.24 mg/dL   Calcium  10.3 8.9 - 10.3 mg/dL   GFR, Estimated >39 >39 mL/min    Comment: (NOTE) Calculated using the CKD-EPI Creatinine Equation  (2021)  Anion gap 17 (H) 5 - 15    Comment: Performed at [facility], [address] [place], [place] [address]  CBC     Status: Abnormal   Collection Time: 03/08/24  5:08 AM  Result Value Ref Range   WBC 8.5 4.0 - 10.5 K/uL   RBC 6.33 (H) 4.22 - 5.81 MIL/uL   Hemoglobin 17.7 (H) 13.0 - 17.0 g/dL   HCT 44.8 (H) 60.9 - 47.9 %   MCV 87.0 80.0 - 100.0 fL   MCH 28.0 26.0 - 34.0 pg   MCHC 32.1 30.0 - 36.0 g/dL   RDW 85.9 88.4 - 84.4 %   Platelets 333 150 - 400 K/uL   nRBC 0.0 0.0 - 0.2 %    Comment: Performed at [facility], [address] [place], [place] [address]  Vitamin B12     Status: None   Collection Time: 03/08/24  5:08 AM  Result Value Ref Range   Vitamin B-12 573 180 - 914 pg/mL    Comment: Performed at [facility], [address] [place], [place] [address]  TSH     Status: None   Collection Time: 03/08/24  5:08 AM  Result Value Ref Range   TSH 2.640 0.350 - 4.500 uIU/mL    Comment: Performed at [facility], [address], [place], [place] [address]  Vitamin B1     Status: None   Collection Time: 03/08/24  5:08 AM  Result Value Ref Range   Vitamin B1 (Thiamine) 151.8 66.5 - 200.0 nmol/L    Comment: (NOTE) This test was developed and its performance characteristics determined by [facility]. It has not been cleared or approved by the Food and Drug Administration. Performed At: [facility] [address] [place], [place] [id number] Jennette Shorter MD Ey:[id number]   Glucose, capillary     Status: None   Collection Time: 03/08/24  7:30 AM  Result Value Ref Range   Glucose-Capillary 95 70 - 99 mg/dL    Comment: Glucose reference range applies only to samples taken after fasting for at least 8 hours.  Blood gas, arterial     Status: Abnormal   Collection Time: 03/08/24 10:15 AM  Result Value Ref Range   FIO2 21 %   pH, Arterial 7.49 (H) 7.35 - 7.45   pCO2 arterial 34 32 - 48  mmHg   pO2, Arterial 65 (L) 83 - 108 mmHg   Bicarbonate 26.1 20.0 - 28.0 mmol/L   Acid-Base Excess 2.8 (H) 0.0 - 2.0 mmol/L   O2 Saturation 95.5 %   Patient temperature 36.7    Collection site RIGHT RADIAL    Drawn by [id number]    Allens test (pass/fail) PASS PASS    Comment: Performed at [facility], [address], [place], [place] [address]  Glucose, capillary     Status: Abnormal   Collection Time: 03/08/24 11:48 AM  Result Value Ref Range   Glucose-Capillary 179 (H) 70 - 99 mg/dL    Comment: Glucose reference range applies only to samples taken after fasting for at least 8 hours.  Valproic acid  level     Status: None   Collection Time: 03/08/24  3:41 PM  Result Value Ref Range   Valproic Acid  Lvl 73 50 - 100 ug/mL    Comment: Performed at [facility], [address] [place], [place] [address]  Glucose, capillary     Status: Abnormal   Collection Time:  03/08/24  4:33 PM  Result Value Ref Range   Glucose-Capillary 104 (H) 70 - 99 mg/dL    Comment: Glucose reference range applies only to samples taken after fasting for at least 8 hours.  Glucose, capillary     Status: None   Collection Time: 03/08/24  8:20 PM  Result Value Ref Range   Glucose-Capillary 93 70 - 99 mg/dL    Comment: Glucose reference range applies only to samples taken after fasting for at least 8 hours.  Glucose, capillary     Status: None   Collection Time: 03/09/24  7:29 AM  Result Value Ref Range   Glucose-Capillary 84 70 - 99 mg/dL    Comment: Glucose reference range applies only to samples taken after fasting for at least 8 hours.  CBC     Status: None   Collection Time: 03/09/24  8:21 AM  Result Value Ref Range   WBC 6.4 4.0 - 10.5 K/uL   RBC 5.57 4.22 - 5.81 MIL/uL   Hemoglobin 15.9 13.0 - 17.0 g/dL   HCT 52.0 60.9 - 47.9 %   MCV 86.0 80.0 - 100.0 fL   MCH 28.5 26.0 - 34.0 pg   MCHC 33.2 30.0 - 36.0 g/dL   RDW 86.4 88.4 - 84.4 %   Platelets 260 150 - 400 K/uL    nRBC 0.0 0.0 - 0.2 %    Comment: Performed at [facility], [address] [place], [place] [address]  Basic metabolic panel     Status: None   Collection Time: 03/09/24  8:21 AM  Result Value Ref Range   Sodium 138 135 - 145 mmol/L   Potassium 4.2 3.5 - 5.1 mmol/L   Chloride 101 98 - 111 mmol/L   CO2 25 22 - 32 mmol/L   Glucose, Bld 82 70 - 99 mg/dL    Comment: Glucose reference range applies only to samples taken after fasting for at least 8 hours.   BUN 19 6 - 20 mg/dL   Creatinine, Ser 9.07 0.61 - 1.24 mg/dL   Calcium  9.6 8.9 - 10.3 mg/dL   GFR, Estimated >39 >39 mL/min    Comment: (NOTE) Calculated using the CKD-EPI Creatinine Equation (2021)    Anion gap 12 5 - 15    Comment: Performed at [facility], [address], [place], [place] [address]  Glucose, capillary     Status: Abnormal   Collection Time: 03/09/24 11:32 AM  Result Value Ref Range   Glucose-Capillary 118 (H) 70 - 99 mg/dL    Comment: Glucose reference range applies only to samples taken after fasting for at least 8 hours.  Glucose, capillary     Status: Abnormal   Collection Time: 03/09/24  4:17 PM  Result Value Ref Range   Glucose-Capillary 121 (H) 70 - 99 mg/dL    Comment: Glucose reference range applies only to samples taken after fasting for at least 8 hours.  Glucose, capillary     Status: Abnormal   Collection Time: 03/09/24  9:13 PM  Result Value Ref Range   Glucose-Capillary 102 (H) 70 - 99 mg/dL    Comment: Glucose reference range applies only to samples taken after fasting for at least 8 hours.  Glucose, capillary     Status: Abnormal   Collection Time: 03/10/24  9:29 AM  Result Value Ref Range   Glucose-Capillary 171 (H) 70 - 99 mg/dL    Comment: Glucose reference range applies only to samples taken after  fasting for at least 8 hours.  Glucose, capillary     Status: Abnormal   Collection Time: 03/10/24 12:57 PM  Result Value Ref Range    Glucose-Capillary 133 (H) 70 - 99 mg/dL    Comment: Glucose reference range applies only to samples taken after fasting for at least 8 hours.  Glucose, capillary     Status: None   Collection Time: 03/10/24  5:11 PM  Result Value Ref Range   Glucose-Capillary 99 70 - 99 mg/dL    Comment: Glucose reference range applies only to samples taken after fasting for at least 8 hours.  Glucose, capillary     Status: Abnormal   Collection Time: 03/10/24 10:01 PM  Result Value Ref Range   Glucose-Capillary 110 (H) 70 - 99 mg/dL    Comment: Glucose reference range applies only to samples taken after fasting for at least 8 hours.  Glucose, capillary     Status: None   Collection Time: 03/11/24  7:23 AM  Result Value Ref Range   Glucose-Capillary 87 70 - 99 mg/dL    Comment: Glucose reference range applies only to samples taken after fasting for at least 8 hours.  Glucose, capillary     Status: Abnormal   Collection Time: 03/11/24 12:05 PM  Result Value Ref Range   Glucose-Capillary 186 (H) 70 - 99 mg/dL    Comment: Glucose reference range applies only to samples taken after fasting for at least 8 hours.  Glucose, capillary     Status: None   Collection Time: 03/11/24  4:37 PM  Result Value Ref Range   Glucose-Capillary 87 70 - 99 mg/dL    Comment: Glucose reference range applies only to samples taken after fasting for at least 8 hours.  Glucose, capillary     Status: None   Collection Time: 03/11/24  9:11 PM  Result Value Ref Range   Glucose-Capillary 95 70 - 99 mg/dL    Comment: Glucose reference range applies only to samples taken after fasting for at least 8 hours.  CBC     Status: None   Collection Time: 03/12/24  4:29 AM  Result Value Ref Range   WBC 5.9 4.0 - 10.5 K/uL   RBC 5.50 4.22 - 5.81 MIL/uL   Hemoglobin 15.2 13.0 - 17.0 g/dL   HCT 52.3 60.9 - 47.9 %   MCV 86.5 80.0 - 100.0 fL   MCH 27.6 26.0 - 34.0 pg   MCHC 31.9 30.0 - 36.0 g/dL   RDW 86.3 88.4 - 84.4 %   Platelets 222  150 - 400 K/uL   nRBC 0.0 0.0 - 0.2 %    Comment: Performed at [facility], [address], [place], [place] [address]  Basic metabolic panel with GFR     Status: None   Collection Time: 03/12/24  4:29 AM  Result Value Ref Range   Sodium 139 135 - 145 mmol/L   Potassium 3.9 3.5 - 5.1 mmol/L   Chloride 101 98 - 111 mmol/L   CO2 28 22 - 32 mmol/L   Glucose, Bld 78 70 - 99 mg/dL    Comment: Glucose reference range applies only to samples taken after fasting for at least 8 hours.   BUN 14 6 - 20 mg/dL   Creatinine, Ser 8.97 0.61 - 1.24 mg/dL   Calcium  9.7 8.9 - 10.3 mg/dL   GFR, Estimated >39 >39 mL/min    Comment: (NOTE) Calculated using the CKD-EPI Creatinine Equation (2021)  Anion gap 10 5 - 15    Comment: Performed at [facility], [address], [place], [place] [address]  Glucose, capillary     Status: None   Collection Time: 03/12/24  7:38 AM  Result Value Ref Range   Glucose-Capillary 79 70 - 99 mg/dL    Comment: Glucose reference range applies only to samples taken after fasting for at least 8 hours.  Glucose, capillary     Status: None   Collection Time: 03/12/24 11:35 AM  Result Value Ref Range   Glucose-Capillary 95 70 - 99 mg/dL    Comment: Glucose reference range applies only to samples taken after fasting for at least 8 hours.  Glucose, capillary     Status: None   Collection Time: 03/12/24  4:21 PM  Result Value Ref Range   Glucose-Capillary 96 70 - 99 mg/dL    Comment: Glucose reference range applies only to samples taken after fasting for at least 8 hours.  Glucose, capillary     Status: None   Collection Time: 03/12/24  8:47 PM  Result Value Ref Range   Glucose-Capillary 97 70 - 99 mg/dL    Comment: Glucose reference range applies only to samples taken after fasting for at least 8 hours.   Comment 1 Notify RN    Comment 2 Document in Chart   Glucose, capillary     Status: None   Collection Time: 03/13/24 10:38 AM   Result Value Ref Range   Glucose-Capillary 91 70 - 99 mg/dL    Comment: Glucose reference range applies only to samples taken after fasting for at least 8 hours.  MRSA Next Gen by PCR, Nasal     Status: Abnormal   Collection Time: 03/13/24  2:17 PM   Specimen: Nasal Mucosa; Nasal Swab  Result Value Ref Range   MRSA by PCR Next Gen DETECTED (A) NOT DETECTED    Comment: (NOTE) The GeneXpert MRSA Assay (FDA approved for NASAL specimens only), is one component of a comprehensive MRSA colonization surveillance program. It is not intended to diagnose MRSA infection nor to guide or monitor treatment for MRSA infections. Test performance is not FDA approved in patients less than 97 years old. Performed at [facility], [address], [place], [place] [address]   CBC     Status: None   Collection Time: 03/13/24  2:42 PM  Result Value Ref Range   WBC 6.1 4.0 - 10.5 K/uL   RBC 5.08 4.22 - 5.81 MIL/uL   Hemoglobin 14.1 13.0 - 17.0 g/dL   HCT 55.4 60.9 - 47.9 %   MCV 87.6 80.0 - 100.0 fL   MCH 27.8 26.0 - 34.0 pg   MCHC 31.7 30.0 - 36.0 g/dL   RDW 86.3 88.4 - 84.4 %   Platelets 213 150 - 400 K/uL   nRBC 0.0 0.0 - 0.2 %    Comment: Performed at [facility], [address] [place], [place] [address]  Basic metabolic panel     Status: None   Collection Time: 03/13/24  2:42 PM  Result Value Ref Range   Sodium 137 135 - 145 mmol/L   Potassium 4.1 3.5 - 5.1 mmol/L   Chloride 103 98 - 111 mmol/L   CO2 26 22 - 32 mmol/L   Glucose, Bld 85 70 - 99 mg/dL    Comment: Glucose reference range applies only to samples taken after fasting for at least 8 hours.   BUN 15  6 - 20 mg/dL   Creatinine, Ser 9.12 0.61 - 1.24 mg/dL   Calcium  9.1 8.9 - 10.3 mg/dL   GFR, Estimated >39 >39 mL/min    Comment: (NOTE) Calculated using the CKD-EPI Creatinine Equation (2021)    Anion gap 9 5 - 15    Comment: Performed at [facility], [address], [place], [place] [address]  Glucose, capillary     Status: None   Collection Time: 03/13/24  3:59 PM  Result Value Ref Range   Glucose-Capillary 77 70 - 99 mg/dL    Comment: Glucose reference range applies only to samples taken after fasting for at least 8 hours.   Comment 1 Notify RN    Comment 2 Document in Chart   Glucose, capillary     Status: Abnormal   Collection Time: 03/13/24  9:32 PM  Result Value Ref Range   Glucose-Capillary 64 (L) 70 - 99 mg/dL    Comment: Glucose reference range applies only to samples taken after fasting for at least 8 hours.   Comment 1 Notify RN    Comment 2 Document in Chart   Glucose, capillary     Status: None   Collection Time: 03/13/24  9:35 PM  Result Value Ref Range   Glucose-Capillary 71 70 - 99 mg/dL    Comment: Glucose reference range applies only to samples taken after fasting for at least 8 hours.   Comment 1 Notify RN    Comment 2 Document in Chart   Glucose, capillary     Status: None   Collection Time: 03/13/24 10:45 PM  Result Value Ref Range   Glucose-Capillary 94 70 - 99 mg/dL    Comment: Glucose reference range applies only to samples taken after fasting for at least 8 hours.   Comment 1 Notify RN    Comment 2 Document in Chart   Glucose, capillary     Status: None   Collection Time: 03/14/24  3:04 AM  Result Value Ref Range   Glucose-Capillary 87 70 - 99 mg/dL    Comment: Glucose reference range applies only to samples taken after fasting for at least 8 hours.  CBC     Status: None   Collection Time: 03/14/24  3:05 AM  Result Value Ref Range   WBC 4.8 4.0 - 10.5 K/uL   RBC 5.14 4.22 - 5.81 MIL/uL   Hemoglobin 14.6 13.0 - 17.0 g/dL   HCT 54.2 60.9 - 47.9 %   MCV 88.9 80.0 - 100.0 fL   MCH 28.4 26.0 - 34.0 pg   MCHC 31.9 30.0 - 36.0 g/dL   RDW 86.3 88.4 - 84.4 %   Platelets 182 150 - 400 K/uL   nRBC 0.0 0.0 - 0.2 %    Comment: Performed at [facility], [address] [place], [place] [address]   Basic metabolic panel with GFR     Status: None   Collection Time: 03/14/24  3:05 AM  Result Value Ref Range   Sodium 138 135 - 145 mmol/L   Potassium 3.9 3.5 - 5.1 mmol/L   Chloride 102 98 - 111 mmol/L   CO2 28 22 - 32 mmol/L   Glucose, Bld 83 70 - 99 mg/dL    Comment: Glucose reference range applies only to samples taken after fasting for at least 8 hours.   BUN 12 6 - 20 mg/dL   Creatinine, Ser 9.19 0.61 - 1.24 mg/dL   Calcium  9.3 8.9 - 10.3  mg/dL   GFR, Estimated >39 >39 mL/min    Comment: (NOTE) Calculated using the CKD-EPI Creatinine Equation (2021)    Anion gap 8 5 - 15    Comment: Performed at [facility], [address] [place], [place] [address]      ## Disposition:-- There are no psychiatric contraindications to discharge at this time  ## Behavioral / Environmental: -Delirium Precautions: Delirium Interventions for Nursing and Staff: - RN to open blinds every AM. - To Bedside: Glasses, hearing aide, and pt's own shoes. Make available to patients. when possible and encourage use. - Encourage po fluids when appropriate, keep fluids within reach. - OOB to chair with meals. - Passive ROM exercises to all extremities with AM & PM care. - RN to assess orientation to person, time and place QAM and PRN. - Recommend extended visitation hours with familiar family/friends as feasible. - Staff to minimize disturbances at night. Turn off television when pt asleep or when not in use.    ## Safety and Observation Level:  - Based on my clinical evaluation, I estimate the patient to be at minimal risk of self harm in the current setting. - At this time, we recommend  1:1 Observation. This decision is based on my review of the chart including patient's history and current presentation, interview of the patient, mental status examination, and consideration of suicide risk including evaluating suicidal ideation, plan, intent, suicidal or self-harm behaviors, risk factors,  and protective factors. This judgment is based on our ability to directly address suicide risk, implement suicide prevention strategies, and develop a safety plan while the patient is in the clinical setting. Please contact our team if there is a concern that risk level has changed.  CSSR Risk Category:C-SSRS RISK CATEGORY: No Risk  Suicide Risk Assessment: Patient has following modifiable risk factors for suicide: medication noncompliance, which we are addressing by currently in the hospital receiving medications but will need safe discharge plan. Patient has following non-modifiable or demographic risk factors for suicide: male gender Patient has the following protective factors against suicide: NA  Thank you for this consult request. Recommendations have been communicated to the primary team.  We will sign off at this time.   Sharlot Becker, NP       History of Present Illness  Patient Report:  The interview is complicated by the patient's lethargy and being in and out of consciousness. He was oriented to person, place, and time but disoriented to situation. He was unable to report why he was currently in the hospital. Per the sitter at bedside she was informed that the patient had a history of aggression but he had behaved well this morning. He was eating fine and the plan was to make sure that he remained awake during the day shift. The patient denied any SI/HI/AVH or paranoid thoughts about the staff. He stated that he was unsure where he would be able to go once he was discharged from the hospital   Collateral information:  Sari Darlis Benne [phone] Called twice and left message  ROS   Psychiatric and Social History  Psychiatric History:  Information collected from Patient  Prev Dx/Sx: Bipolar Disorder Current Psych Provider: [facility] Home Meds (current): Cymbalta  and Depakote  Previous Med Trials: Cymablta, Depakote , Ingrezza  Therapy: [facility]  Prior Psych Hospitalization: Yes   Prior Self Harm: Denies Prior Violence: Yes  Family Psych History: [facility] Family Hx suicide: [facility]  Social History:  Developmental Hx: Unremarkable Educational Hx: [facility]TA Occupational Hx:  Unemployed Legal Hx: UTA Living Situation: Homeless Spiritual Hx: UTA Access to weapons/lethal means: Denies   Substance History Alcohol: Denies  Tobacco: Denies  Illicit drugs: Denies  Prescription drug abuse: Denies  Rehab hx: Denies   Exam Findings  Physical Exam:  Vital Signs:  Temp:  [97.3 F (36.3 C)-98.2 F (36.8 C)] 97.9 F (36.6 C) (11/05 1200) Pulse Rate:  [51-65] 63 (11/05 0900) Resp:  [11-20] 16 (11/05 1000) BP: (85-149)/(45-97) 115/92 (11/05 1000) SpO2:  [92 %-100 %] 96 % (11/05 0900) Blood pressure (!) 115/92, pulse 63, temperature 97.9 F (36.6 C), temperature source Oral, resp. rate 16, height 5' 10 (1.778 m), weight 87.4 kg, SpO2 96%. Body mass index is 27.65 kg/m.  Physical Exam  Mental Status Exam: General Appearance: Casual  Orientation:  Person, place, and time disoriented to situation  Memory:  Fair  Concentration:  Fair  Recall: Fair  Attention  Fair  Eye Contact:  Good  Speech:  WDL  Language:  Fair  Volume:  WDL  Mood: denies depression and anxiety  Affect:  appropriate  Thought Process:  logical  Thought Content:  WDL  Suicidal Thoughts:  No  Homicidal Thoughts:  No  Judgement:  Fair  Insight:  Poor  Psychomotor Activity:  Tremor  Akathisia:  No  Fund of Knowledge:  Estimated to be below average      Assets:  None  Cognition:  Impaired,  Moderate  ADL's:  Impaired  AIMS (if indicated):        Other History   These have been pulled in through the EMR, reviewed, and updated if appropriate.  Family History:  The patient's family history includes Anxiety disorder in his father and mother; Cancer in his paternal grandfather; Depression in his brother; Diabetes in his father; Heart attack in his maternal grandfather; Heart disease in his mother;  Heart failure in his paternal grandmother; Kidney failure in his father; Mental illness in his sister; Thyroid  disease in his brother and sister.  Medical History: Past Medical History:  Diagnosis Date   Anxiety    Arthritis    Chest heaviness    Chills    Depression, major    Diabetes mellitus without complication (HCC)    Dizzy    Dyslipidemia    ED (erectile dysfunction)    Elevated CPK    GERD (gastroesophageal reflux disease)    HA (headache)    Hypertension    Hypertriglyceridemia    Insomnia    Kidney stone    Left-sided Bell's palsy    Morbid obesity (HCC)    Nausea    No appetite    Panniculitis    of right lower leg   Parkinson's disease (HCC)    PTSD (post-traumatic stress disorder)    Routine screening for STI (sexually transmitted infection) 01/05/2023   Weakness     Surgical History: Past Surgical History:  Procedure Laterality Date   NO PAST SURGERIES     RADIOLOGY WITH ANESTHESIA N/A 03/13/2024   Procedure: MRI WITH ANESTHESIA;  Surgeon: Radiologist, Medication, MD;  Location: [facility] OR;  Service: Radiology;  Laterality: N/A;     Medications:   Current Facility-Administered Medications:    acetaminophen  (TYLENOL ) tablet 650 mg, 650 mg, Oral, Q6H PRN, 650 mg at 03/11/24 0808 **OR** acetaminophen  (TYLENOL ) suppository 650 mg, 650 mg, Rectal, Q6H PRN, Lou Claretta HERO, MD   amoxicillin-clavulanate (AUGMENTIN) 875-125 MG per tablet 1 tablet, 1 tablet, Oral, Q12H, Regalado, Belkys A, MD, 1 tablet at 03/14/24 [phone]  artificial tears ophthalmic solution 1 drop, 1 drop, Both Eyes, PRN, Regalado, Belkys A, MD   atorvastatin  (LIPITOR) tablet 20 mg, 20 mg, Oral, Daily, Mannie Pac T, DO, 20 mg at 03/14/24 9090   benztropine  (COGENTIN ) tablet 1 mg, 1 mg, Oral, BID, Mannie Pac T, DO, 1 mg at 03/14/24 9090   bisacodyl (DULCOLAX) EC tablet 5 mg, 5 mg, Oral, Daily PRN, Lou Claretta HERO, MD   Chlorhexidine Gluconate Cloth 2 % PADS 6 each, 6 each, Topical,  Daily, Regalado, Belkys A, MD, 6 each at 03/13/24 1400   divalproex  (DEPAKOTE ) DR tablet 750 mg, 750 mg, Oral, BID, Mitchell, Jerrell L, DO, 750 mg at 03/14/24 0910   doxycycline  (VIBRA -TABS) tablet 100 mg, 100 mg, Oral, Q12H, Regalado, Belkys A, MD, 100 mg at 03/14/24 0909   DULoxetine  (CYMBALTA ) DR capsule 40 mg, 40 mg, Oral, BID, Mannie Pac T, DO, 40 mg at 03/14/24 0909   enoxaparin  (LOVENOX ) injection 40 mg, 40 mg, Subcutaneous, Q24H, Lou Claretta HERO, MD, 40 mg at 03/14/24 0911   haloperidol (HALDOL) tablet 5 mg, 5 mg, Oral, Q8H PRN **OR** haloperidol lactate (HALDOL) injection 5 mg, 5 mg, Intramuscular, Q8H PRN, Akintayo, Musa A, MD, 5 mg at 03/13/24 0803   hydrOXYzine  (ATARAX ) tablet 50 mg, 50 mg, Oral, QHS, Regalado, Belkys A, MD, 50 mg at 03/13/24 2103   lip balm (CARMEX) ointment, , Topical, PRN, Regalado, Belkys A, MD, 1 Application at 03/12/24 0934   ondansetron (ZOFRAN) tablet 4 mg, 4 mg, Oral, Q6H PRN **OR** ondansetron (ZOFRAN) injection 4 mg, 4 mg, Intravenous, Q6H PRN, Amponsah, Claretta HERO, MD   senna-docusate (Senokot-S) tablet 1 tablet, 1 tablet, Oral, QHS PRN, Lou Claretta HERO, MD   traZODone (DESYREL) tablet 50 mg, 50 mg, Oral, QHS PRN, Jacquetta Sharlot GRADE, NP, 50 mg at 03/12/24 2129   valbenazine  (INGREZZA ) capsule 80 mg, 80 mg, Oral, Daily, Mannie Pac T, DO, 80 mg at 03/14/24 0908   Vitamin D  (Ergocalciferol ) (DRISDOL) 1.25 MG (50000 UNIT) capsule 50,000 Units, 50,000 Units, Oral, Q7 days, Regalado, Belkys A, MD, 50,000 Units at 03/08/24 9042  Allergies: No Known Allergies  Sharlot Jacquetta, NP

## 2024-03-14 NOTE — Progress Notes (Signed)
 NAME:  [name] [name], MRN:  [id number], DOB:  1966-05-10, LOS: 7 ADMISSION DATE:  03/02/2024, CONSULTATION DATE: 03/13/2024 REFERRING MD: Dr. Madelyne, CHIEF COMPLAINT: Hypotension  History of Present Illness:  61 yoM with PMH as below significant for DM, GERD, HTN, HLD, bipolar 1, PTSD, MDD, CAD, chronic lower back pain, falls, and obesity.  Initially presented 10/12 to [facility] ER eval for fall and chronic back pain.  Pt unable to return to previous living situation given behavior and was awaiting placement.  10/19 psych consulted given SI thoughts, cleared by psych on 10/22.  Remained in ER awaiting placement complicated by financial and behavioral barriers until found to have unclear encephalopathy, therefore admitted to [facility] for further workup on 10/24.  Since his encephalopathy workup has been unrevealing with negative CTH, EEG, labs.  Seen again by psych 10/31, depakote  was increased and haldol recommended for acute agitation, acute delirium suspected.  Has been lethargic, answering some questions previously, at baseline with slowed responses.  Was sent to [facility] for MRI brain with general sedation with anesthesia.  Post MRI, required precedex and found to be hypotensive.  PCCM consulted.  Precedex was stopped, neosynephrine started in addition to be given albumin x 2 and completing 2L LR by anesthesia.    Pertinent  Medical History   Past Medical History:  Diagnosis Date   Anxiety    Arthritis    Chest heaviness    Chills    Depression, major    Diabetes mellitus without complication (HCC)    Dizzy    Dyslipidemia    ED (erectile dysfunction)    Elevated CPK    GERD (gastroesophageal reflux disease)    HA (headache)    Hypertension    Hypertriglyceridemia    Insomnia    Kidney stone    Left-sided Bell's palsy    Morbid obesity (HCC)    Nausea    No appetite    Panniculitis    of right lower leg   Parkinson's disease (HCC)    PTSD (post-traumatic stress disorder)    Routine  screening for STI (sexually transmitted infection) 01/05/2023   Weakness    Significant Hospital Events: Including procedures, antibiotic start and stop dates in addition to other pertinent events   PCCM consult 03/13/2024  Interim History / Subjective:  Has been able to wean off Neo-Synephrine Awake alert interactive Has no specific complaints at present  Objective    Blood pressure 113/77, pulse 63, temperature 97.6 F (36.4 C), temperature source Axillary, resp. rate 14, height 5' 10 (1.778 m), weight 87.4 kg, SpO2 95%.        Intake/Output Summary (Last 24 hours) at 03/14/2024 0911 Last data filed at 03/14/2024 0640 Gross per 24 hour  Intake 2749.07 ml  Output 1900 ml  Net 849.07 ml   Filed Weights   03/07/24 2229  Weight: 87.4 kg    Examination: General: Middle-age, does not appear to be in distress HENT: Moist oral mucosa, chapped lips.  Pupils equal and reacting Lungs: Clear breath sounds Cardiovascular: S1-S2 appreciated Abdomen: Soft, bowel sounds appreciated Extremities: No clubbing, no edema Neuro: Awake alert oriented to person place GU: Fair output  I reviewed last 24 h vitals and pain scores, last 48 h intake and output, last 24 h labs and trends, and last 24 h imaging results.  WBC 4.8, hematocrit of 45.7, platelets 182 BUN 12, creatinine of 0.8  Resolved problem list   Assessment and Plan   Hypotension - Has been  able to wean off Neo-Synephrine - This was likely in the context of dehydration and sedation - Did receive adequate fluid resuscitation - Received albumin and - Antihypertensive on hold  Encephalopathy, component of delirium History of bipolar disorder History of depression Tardive dyskinesia/tremors - MRI brain performed-unremarkable.  Mild chronic small vessel ischemic disease and cerebral atrophy, small chronic right cerebellar infarct - Afebrile, normal CBC - On empiric thiamine - Avoid benzodiazepines  Type 2 diabetes -  Continue monitoring  Hyperlipidemia Hypertension Chronic pain - Management per primary  Has stabilized and will be moved out of the ICU  PCCM will sign off

## 2024-03-14 NOTE — Progress Notes (Addendum)
 Entered in error

## 2024-03-14 NOTE — Progress Notes (Signed)
 Physical Therapy Treatment Patient Details Name: BECKY BERBERIAN MRN: [id number] DOB: Jul 15, 1965 Today's Date: 03/14/2024   History of Present Illness Pt is a 58 y/o male presenting on 10/24 for fall and chronic back pain. 10/19 psychiatry consulted due to suicidal thoughts, cleared on 10/22. Change in mental status due to acute delirium, and admitted from ED with acute metabolic encephalopathy. CT negative, EEG negative, MRI pending, chest x ray with hypoventilation. PMH includes: bipolar disorder type 1, tardive dyskinesia, PTSD, MDD, CAD, DM2, OA, tremors.    PT Comments  Pt required min assist for supine to sit and min assist for standing balance initially with sit to stand 2* posterior lean. Pt had no compensatory reaction nor awareness of unsteadiness in standing. Pt ambulated 175' with RW, min A for balance 2* posterior lean x 2 instances, pt bumped into obstacles with RW x 2. Assistance for mobility recommended as pt is a fall risk.     If plan is discharge home, recommend the following: Assistance with cooking/housework;Assist for transportation;Help with stairs or ramp for entrance;A little help with walking and/or transfers;A little help with bathing/dressing/bathroom   Can travel by private vehicle     Yes  Equipment Recommendations  None recommended by PT    Recommendations for Other Services       Precautions / Restrictions Precautions Precautions: Fall Recall of Precautions/Restrictions: Impaired Restrictions Weight Bearing Restrictions Per Provider Order: No     Mobility  Bed Mobility Overal bed mobility: Needs Assistance Bed Mobility: Supine to Sit     Supine to sit: Min assist     General bed mobility comments: min A to raise trunk    Transfers Overall transfer level: Needs assistance Equipment used: Rolling walker (2 wheels) Transfers: Sit to/from Stand Sit to Stand: Min assist           General transfer comment: VCs hand placement, assist to  power up, min assist for posterior lean initially upon standing, no compensatory strategies nor awareness of loss of balance    Ambulation/Gait Ambulation/Gait assistance: Contact guard assist, Min assist Gait Distance (Feet): 175 Feet Assistive device: Rolling walker (2 wheels) Gait Pattern/deviations: Step-through pattern Gait velocity: reduced     General Gait Details: mild posterior lean x 2 requiring min assist to steady,  bumping into objects with RW x 2; decreased safety awareness, pt reports he has a RW at home   Stairs             Wheelchair Mobility     Tilt Bed    Modified Rankin (Stroke Patients Only)       Balance Overall balance assessment: Needs assistance Sitting-balance support: No upper extremity supported, Feet supported Sitting balance-Leahy Scale: Fair Sitting balance - Comments: statically min guard for safety   Standing balance support: Bilateral upper extremity supported, During functional activity, Single extremity supported Standing balance-Leahy Scale: Poor Standing balance comment: relies on RW support, posterior lean needing external assist                            Communication Communication Communication: Impaired Factors Affecting Communication: Reduced clarity of speech  Cognition Arousal: Alert Behavior During Therapy: Flat affect   PT - Cognitive impairments: No family/caregiver present to determine baseline, Orientation, Awareness, Problem solving, Safety/Judgement                       PT - Cognition Comments: unsteady in standing but  no compensatory reactions Following commands: Impaired Following commands impaired: Follows one step commands with increased time, Follows multi-step commands inconsistently    Cueing Cueing Techniques: Verbal cues, Tactile cues, Gestural cues, Visual cues  Exercises      General Comments        Pertinent Vitals/Pain Pain Assessment Pain Assessment: No/denies  pain    Home Living                          Prior Function            PT Goals (current goals can now be found in the care plan section) Acute Rehab PT Goals Patient Stated Goal: Decreased back pain; no more falls PT Goal Formulation: With patient Time For Goal Achievement: 03/04/24 Potential to Achieve Goals: Good Progress towards PT goals: Progressing toward goals    Frequency    Min 2X/week      PT Plan      Co-evaluation              AM-PAC PT 6 Clicks Mobility   Outcome Measure  Help needed turning from your back to your side while in a flat bed without using bedrails?: A Little Help needed moving from lying on your back to sitting on the side of a flat bed without using bedrails?: A Little Help needed moving to and from a bed to a chair (including a wheelchair)?: A Little Help needed standing up from a chair using your arms (e.g., wheelchair or bedside chair)?: A Little Help needed to walk in hospital room?: A Little Help needed climbing 3-5 steps with a railing? : A Lot 6 Click Score: 17    End of Session Equipment Utilized During Treatment: Gait belt Activity Tolerance: Patient tolerated treatment well Patient left: with call bell/phone within reach (on toilet, RN notified) Nurse Communication: Mobility status PT Visit Diagnosis: Difficulty in walking, not elsewhere classified (R26.2);History of falling (Z91.81);Unsteadiness on feet (R26.81);Other abnormalities of gait and mobility (R26.89);Muscle weakness (generalized) (M62.81);Repeated falls (R29.6)     Time: 8590-8581 PT Time Calculation (min) (ACUTE ONLY): 9 min  Charges:    $Gait Training: 8-22 mins PT General Charges $$ ACUTE PT VISIT: 1 Visit                     [name] PT 03/14/2024  Acute Rehabilitation Services  Office [phone]

## 2024-03-14 NOTE — Progress Notes (Signed)
 PROGRESS NOTE    [name] [name]  FMW:[id number] DOB: 07-07-1965 DOA: 03/02/2024 PCP: [name], [name]    Brief Narrative:  58 year old with history of bipolar type I disorder, tardive dyskinesia, PTSD, MDD, GAD, type 2 diabetes on glipizide metformin  at home, hyperlipidemia and baseline tremors initially admitted to ED with fall and chronic back pain on 10/12.  Patient has been living with a friend for the last 2 to 3 years, patient was having bizarre behavior so friend did not let him come back to the apartment. Patient was boarded in the emergency room since 10/12. 10/19, psychiatry consulted as patient was endorsing suicidal thoughts-IVC 10/22, psychiatry cleared for discharge. 10/24, patient was noted to be encephalopathic so admitted to the hospital.  Multiple investigations were negative. 11/4, MRI under sedation, transient hypotension and altered mental status-overnight in ICU. 11/5, clinically improved.  Sedation wore off.  Transferred back to MedSurg bed.  Pending safe discharge planning.  Subjective: Patient seen and examined.  Denies any complaints.  He is alert awake and mostly oriented.  Denies any suicidal homicidal ideation.  Patient tells me that he has some baseline tremors.  Patient tells me he lives with roommate but not sure he can go back.  He has not walked since last few days.  No other overnight events in the ICU. Remains on room air.  Declined Accu-Cheks. Transfer to MedSurg bed.   Assessment & Plan:   Acute metabolic encephalopathy in a patient with underlying psychiatric disorder: This is likely from sedation, overmedication or behavior changes.  Clinically improved. CT head and subsequent MRI of the brain with no acute findings. TSH 2.6, vitamin B12 573.  EEG x 3 negative for seizure. He had required multiple medications, sedation for MRI resulting in encephalopathy. On IV thiamine and supportive care. Mobilize, PT OT.  Avoid sedation. Appreciate  neurology and psychiatry input.  Acute hypoxic respiratory failure secondary to hypoventilation and sedation: Improved.  On room air.  Hypertension: Post sedation.  Improved.  Bipolar 1 disorder, major depressive disorder, generalized anxiety disorder: Seen by psychiatry.  On benztropine , duloxetine , Depakote  and hydroxyzine .  Patient on propranolol  and valbenazine  for essential tremors.  Psych adjusted Depakote  doses.  Patient was deemed unsafe to discharge alone.  Looking for safe disposition.  Type 2 diabetes: Noncompliant to glipizide and metformin .  Declining Accu-Cheks.  Eating regular diet.  Discontinue treatment for now.  Oral ulcers: Doxy and Augmentin for 5 days.  Hypertension: Blood pressure stable on lisinopril .  Hyperlipidemia: On a statin.  Vitamin D  deficiency: On vitamin D .    DVT prophylaxis: enoxaparin  (LOVENOX ) injection 40 mg Start: 03/08/24 0800   Code Status: Full code Family Communication: None at the bedside Disposition Plan: Status is: Inpatient Remains inpatient appropriate because: Encephalopathy, clinically improved.  Needs safe discharge plan.     Consultants:   Psychiatry Critical care Neurology  Procedures:  MRI under sedation  Antimicrobials:  Augmentin and doxycycline  11/4---     Objective: Vitals:   03/14/24 0300 03/14/24 0400 03/14/24 0500 03/14/24 0600  BP: (!) 146/67 103/64 116/68 101/60  Pulse: (!) 58 (!) 58 60 (!) 59  Resp: 14 12 14 20   Temp:  97.6 F (36.4 C)    TempSrc:  Axillary    SpO2: 96% 97% 95% 92%  Weight:      Height:        Intake/Output Summary (Last 24 hours) at 03/14/2024 0818 Last data filed at 03/14/2024 0640 Gross per 24 hour  Intake 2749.07  ml  Output 1900 ml  Net 849.07 ml   Filed Weights   03/07/24 2229  Weight: 87.4 kg    Examination:  General exam: Appears calm and comfortable.  Interactive.  Frail. Respiratory system: Clear to auscultation. Respiratory effort normal.  No added  sounds. Cardiovascular system: S1 & S2 heard, RRR. No JVD, murmurs, rubs, gallops or clicks. No pedal edema. Gastrointestinal system: Abdomen is nondistended, soft and nontender. No organomegaly or masses felt. Normal bowel sounds heard. Central nervous system: Alert and awake.  Mostly oriented.  He had some resting tremors more pronounced on the right hand.  He has equal strength on all extremities. In normal mood.    Data Reviewed: I have personally reviewed following labs and imaging studies  CBC: Recent Labs  Lab 03/07/24 1535 03/08/24 0508 03/09/24 0821 03/12/24 0429 03/13/24 1442 03/14/24 0305  WBC 7.9 8.5 6.4 5.9 6.1 4.8  NEUTROABS 4.2  --   --   --   --   --   HGB 17.7* 17.7* 15.9 15.2 14.1 14.6  HCT 53.4* 55.1* 47.9 47.6 44.5 45.7  MCV 85.9 87.0 86.0 86.5 87.6 88.9  PLT 316 333 260 222 213 182   Basic Metabolic Panel: Recent Labs  Lab 03/08/24 0508 03/09/24 0821 03/12/24 0429 03/13/24 1442 03/14/24 0305  NA 136 138 139 137 138  K 4.6 4.2 3.9 4.1 3.9  CL 98 101 101 103 102  CO2 22 25 28 26 28   GLUCOSE 60* 82 78 85 83  BUN 22* 19 14 15 12   CREATININE 1.13 0.92 1.02 0.87 0.80  CALCIUM  10.3 9.6 9.7 9.1 9.3   GFR: Estimated Creatinine Clearance: 102.7 mL/min (by C-G formula based on SCr of 0.8 mg/dL). Liver Function Tests: Recent Labs  Lab 03/07/24 1535  AST 29  ALT 23  ALKPHOS 100  BILITOT 0.8  PROT 8.2*  ALBUMIN 4.6   No results for input(s): LIPASE, AMYLASE in the last 168 hours. Recent Labs  Lab 03/07/24 1535  AMMONIA <13   Coagulation Profile: No results for input(s): INR, PROTIME in the last 168 hours. Cardiac Enzymes: No results for input(s): CKTOTAL, CKMB, CKMBINDEX, TROPONINI in the last 168 hours. BNP (last 3 results) No results for input(s): PROBNP in the last 8760 hours. HbA1C: No results for input(s): HGBA1C in the last 72 hours. CBG: Recent Labs  Lab 03/13/24 1559 03/13/24 2132 03/13/24 2135  03/13/24 2245 03/14/24 0304  GLUCAP 77 64* 71 94 87   Lipid Profile: No results for input(s): CHOL, HDL, LDLCALC, TRIG, CHOLHDL, LDLDIRECT in the last 72 hours. Thyroid  Function Tests: No results for input(s): TSH, T4TOTAL, FREET4, T3FREE, THYROIDAB in the last 72 hours. Anemia Panel: No results for input(s): VITAMINB12, FOLATE, FERRITIN, TIBC, IRON, RETICCTPCT in the last 72 hours. Sepsis Labs: No results for input(s): PROCALCITON, LATICACIDVEN in the last 168 hours.  Recent Results (from the past 240 hours)  MRSA Next Gen by PCR, Nasal     Status: Abnormal   Collection Time: 03/13/24  2:17 PM   Specimen: Nasal Mucosa; Nasal Swab  Result Value Ref Range Status   MRSA by PCR Next Gen DETECTED (A) NOT DETECTED Final    Comment: (NOTE) The GeneXpert MRSA Assay (FDA approved for NASAL specimens only), is one component of a comprehensive MRSA colonization surveillance program. It is not intended to diagnose MRSA infection nor to guide or monitor treatment for MRSA infections. Test performance is not FDA approved in patients less than 72 years old. Performed  at [facility], [address] [place], [place] [address]          Radiology Studies: MR BRAIN WO CONTRAST Result Date: 03/13/2024 EXAM: MRI BRAIN WITHOUT CONTRAST 03/13/2024 10:40:22 AM TECHNIQUE: Multiplanar multisequence MRI of the head/brain was performed without the administration of intravenous contrast. COMPARISON: Head CT 03/07/2024. CLINICAL HISTORY: Altered mental status, nontraumatic (Ped 0-17y). FINDINGS: BRAIN AND VENTRICLES: There is no evidence of an acute infarct, intracranial hemorrhage, mass, midline shift, hydrocephalus, or extra-axial fluid collection. There is mild cerebral atrophy. Periventricular white matter T2 hyperintensities are nonspecific but compatible with mild chronic small vessel ischemic disease. There is a small chronic inferior right  cerebellar infarct. Major intracranial vascular flow voids are preserved. ORBITS: No acute abnormality. SINUSES AND MASTOIDS: Minimal mucosal thickening in the ethmoid sinuses. Trace bilateral mastoid fluid. BONES AND SOFT TISSUES: Normal marrow signal. No acute soft tissue abnormality. IMPRESSION: 1. No acute intracranial abnormality. 2. Mild chronic small vessel ischemic disease and cerebral atrophy. 3. Small chronic right cerebellar infarct. Electronically signed by: Dasie Hamburg MD 03/13/2024 11:44 AM EST RP Workstation: [id number]   DG Abd 1 View Result Date: 03/12/2024 EXAM: 1 VIEW XRAY OF THE ABDOMEN 03/12/2024 12:44:00 PM COMPARISON: Abdominal radiographs 09/03/2019. CLINICAL HISTORY: MRI clearance. FINDINGS: BOWEL: Gas and stool are present in the colon without dilated loops of bowel seen to suggest obstruction. SOFT TISSUES: No radiopaque foreign body is identified in the abdomen or included portion of the pelvis. A 1 cm calculus projecting over the lower pole of the left kidney is unchanged. BONES: No acute osseous abnormality. IMPRESSION: 1. No metallic foreign body. 2. Unchanged left renal calculus. Electronically signed by: Dasie Hamburg MD 03/12/2024 01:35 PM EST RP Workstation: [id number]        Scheduled Meds:  amoxicillin-clavulanate  1 tablet Oral Q12H   atorvastatin   20 mg Oral Daily   benztropine   1 mg Oral BID   Chlorhexidine Gluconate Cloth  6 each Topical Daily   divalproex   750 mg Oral BID   doxycycline   100 mg Oral Q12H   DULoxetine   40 mg Oral BID   enoxaparin  (LOVENOX ) injection  40 mg Subcutaneous Q24H   hydrOXYzine   50 mg Oral QHS   valbenazine   80 mg Oral Daily   Vitamin D  (Ergocalciferol )  50,000 Units Oral Q7 days   Continuous Infusions:   LOS: 7 days    Time spent: 55 minutes    Renato Applebaum, MD [facility]

## 2024-03-14 NOTE — Progress Notes (Signed)
 Occupational Therapy Treatment Patient Details Name: [name] [name] MRN: [id number] DOB: 1966/04/24 Today's Date: 03/14/2024   History of present illness Pt is a 58 yr old male presenting on 10/24 for fall and chronic back pain. 10/19 psychiatry consulted and pt now cleared on 10/22. Change in mental status due to acute delirium, and admitted from ED with acute metabolic encephalopathy. PMH includes: bipolar disorder type 1, tardive dyskinesia, PTSD, MDD, CAD, DM2, OA, tremors.   OT comments  The pt was seen for functional strengthening, ADL instruction and progression of functional activity. He required min to mod assist for lower body dressing, sit to stand using a RW, and toileting at bathroom level. He required increased cues for safety awareness throughout the session, including cues to avoid running into environmental obstacles during dynamic standing activities.He was also noted to be unsteadiness in standing, decreased insight into deficits, disorientation to time, place, and situation, and impaired memory. He intermittently appeared to be with a tremulous like presentation. He further required occasional assist for problem solving and sustained attention to tasks. Continue OT plan of care. Patient will benefit from continued inpatient follow up therapy, <3 hours/day.       If plan is discharge home, recommend the following:  A lot of help with walking and/or transfers;A lot of help with bathing/dressing/bathroom;Assistance with cooking/housework;Direct supervision/assist for financial management;Direct supervision/assist for medications management;Assist for transportation;Supervision due to cognitive status   Equipment Recommendations  BSC/3in1    Recommendations for Other Services      Precautions / Restrictions Restrictions Weight Bearing Restrictions Per Provider Order: No       Mobility Bed Mobility Overal bed mobility: Needs Assistance Bed Mobility: Supine to Sit, Sit to  Supine     Supine to sit: Min assist, Used rails, HOB elevated Sit to supine: Min assist        Transfers Overall transfer level: Needs assistance Equipment used: Rolling walker (2 wheels) Transfers: Sit to/from Stand Sit to Stand: Min assist                 Balance     Sitting balance-Leahy Scale: Fair       Standing balance-Leahy Scale: Poor         ADL either performed or assessed with clinical judgement   ADL Overall ADL's : Needs assistance/impaired     Grooming: Contact guard assist;Minimal assistance;Cueing for safety;Cueing for sequencing Grooming Details (indicate cue type and reason): Pt required assist for balance/steadying in standing at the sink, cues to correctly identify the soap dispenser, min cues for sequencing and cues to step closer to the sink to perform task.             Lower Body Dressing: Moderate assistance;Sitting/lateral leans Lower Body Dressing Details (indicate cue type and reason): The pt was able to doff and donn his L sock, however required assist to doff then donn his R sock. He demonstrated the figure 4 technique to perform. He was noted to be limited by generalized tremulous like presentation.     Toileting- Clothing Manipulation and Hygiene: Cueing for sequencing;Cueing for safety;Minimal assistance Toileting - Clothing Manipulation Details (indicate cue type and reason): Patient stood to urinate at bathroom level. He required cues to step closer to the toilet to perform tasks. He subsequently required steadying assist in standing and min assist for clothing management. Tub/ Shower Transfer: Minimal assistance           Vision Baseline Vision/History: 1 Wears glasses Vision Assessment?: Vision  impaired- to be further tested in functional context   Perception     Praxis     Communication Communication Communication: Impaired Factors Affecting Communication: Reduced clarity of speech   Cognition Arousal:  Alert Behavior During Therapy: Flat affect Cognition: Cognition impaired   Orientation impairments: Place, Time, Situation Awareness: Intellectual awareness intact Memory impairment (select all impairments): Short-term memory, Working civil service fast streamer, Conservation officer, historic buildings Attention impairment (select first level of impairment): Divided attention, Sustained attention Executive functioning impairment (select all impairments): Sequencing, Reasoning, Problem solving, Organization        Following commands: Impaired Following commands impaired: Follows one step commands with increased time, Follows multi-step commands inconsistently      Cueing   Cueing Techniques: Verbal cues, Tactile cues, Gestural cues, Visual cues             Pertinent Vitals/ Pain       Pain Assessment Pain Assessment: No/denies pain   Frequency  Min 2X/week        Progress Toward Goals  OT Goals(current goals can now be found in the care plan section)     Acute Rehab OT Goals OT Goal Formulation: With patient Time For Goal Achievement: 03/25/24 Potential to Achieve Goals: Good  Plan         AM-PAC OT 6 Clicks Daily Activity     Outcome Measure   Help from another person eating meals?: A Little Help from another person taking care of personal grooming?: A Little Help from another person toileting, which includes using toliet, bedpan, or urinal?: A Little Help from another person bathing (including washing, rinsing, drying)?: A Lot Help from another person to put on and taking off regular upper body clothing?: A Little Help from another person to put on and taking off regular lower body clothing?: A Lot 6 Click Score: 16    End of Session Equipment Utilized During Treatment: Rolling walker (2 wheels)  OT Visit Diagnosis: Other abnormalities of gait and mobility (R26.89);Unsteadiness on feet (R26.81);Muscle weakness (generalized) (M62.81);History of falling (Z91.81);Other symptoms and signs  involving cognitive function   Activity Tolerance Patient tolerated treatment well   Patient Left in bed;with bed alarm set;with call bell/phone within reach   Nurse Communication Mobility status        Time: 8359-8341 OT Time Calculation (min): 18 min  Charges: OT General Charges $OT Visit: 1 Visit OT Treatments $Self Care/Home Management : 8-22 mins    [name], OTR/L 03/14/2024, 6:01 PM

## 2024-03-15 DIAGNOSIS — G9340 Encephalopathy, unspecified: Secondary | ICD-10-CM | POA: Diagnosis not present

## 2024-03-15 LAB — GLUCOSE, CAPILLARY
Glucose-Capillary: 113 mg/dL — ABNORMAL HIGH (ref 70–99)
Glucose-Capillary: 89 mg/dL (ref 70–99)
Glucose-Capillary: 149 mg/dL — ABNORMAL HIGH (ref 70–99)
Glucose-Capillary: 119 mg/dL — ABNORMAL HIGH (ref 70–99)

## 2024-03-15 MED ORDER — BENZOCAINE 10 % MT GEL
Freq: Four times a day (QID) | OROMUCOSAL | Status: DC | PRN
  Filled 2024-03-15: qty 9.4

## 2024-03-15 NOTE — TOC Progression Note (Addendum)
 Transition of Care [facility]) - Progression Note    Patient Details  Name: [name] [name] MRN: [id number] Date of Birth: 09/17/1965  Transition of Care [facility] CM/SW Contact  Doneta Glenys DASEN, RN Phone Number: 03/15/2024, 9:34 AM  Clinical Narrative:    [facility] case manager Kate [phone] will sent me a list of in-network SNF. 10:18 AM CM spoke with patient in the room. Explained recommendation for SNF. Patient is agreeable to starting the SNF work up.    Expected Discharge Plan and Services      Social Drivers of Health (SDOH) Interventions SDOH Screenings   Food Insecurity: Patient Unable To Answer (03/07/2024)  Housing: Unknown (03/07/2024)  Transportation Needs: Patient Unable To Answer (03/07/2024)  Utilities: Patient Unable To Answer (03/07/2024)  Depression (PHQ2-9): High Risk (01/26/2024)  Social Connections: Unknown (09/22/2021)   Received from [facility]  Tobacco Use: Low Risk  (03/13/2024)    Readmission Risk Interventions     No data to display

## 2024-03-15 NOTE — Progress Notes (Signed)
 PROGRESS NOTE    [name] [name]  FMW:[id number] DOB: 1965/12/16 DOA: 03/02/2024 PCP: [name], [name]    Brief Narrative:  58 year old with history of bipolar type I disorder, tardive dyskinesia, PTSD, MDD, GAD, type 2 diabetes on glipizide metformin  at home, hyperlipidemia and baseline tremors initially admitted to ED with fall and chronic back pain on 10/12.  Patient has been living with a friend for the last 2 to 3 years, patient was having bizarre behavior so friend did not let him come back to the apartment. Patient was boarded in the emergency room since 10/12. 10/19, psychiatry consulted as patient was endorsing suicidal thoughts-IVC 10/22, psychiatry cleared for discharge. 10/24, patient was noted to be encephalopathic so admitted to the hospital.  Multiple investigations were negative. 11/4, MRI under sedation, transient hypotension and altered mental status-overnight in ICU. 11/5, clinically improved.  Sedation wore off.  Transferred back to MedSurg bed.  Pending safe discharge planning.  Subjective:  Patient seen and examined.  Alert and interactive.  He agrees that he has to find some places for rehab.   Assessment & Plan:   Acute metabolic encephalopathy in a patient with underlying psychiatric disorder: Improved. This is likely from sedation, overmedication or behavior changes.  Clinically improved. CT head and subsequent MRI of the brain with no acute findings. TSH 2.6, vitamin B12 573.  EEG x 3 negative for seizure. He had required multiple medications, sedation for MRI resulting in encephalopathy. Now normalized.  Acute hypoxic respiratory failure secondary to hypoventilation and sedation: Improved.  On room air.  Hypotension: Post sedation.  Improved.  Bipolar 1 disorder, major depressive disorder, generalized anxiety disorder: Seen by psychiatry.  On benztropine , duloxetine , Depakote  and hydroxyzine .  Patient on propranolol  and valbenazine  for essential  tremors. Psych adjusted Depakote  doses.   Patient in need for physical therapy rehab.  Waiting for SNF bed.  Type 2 diabetes: Noncompliant to glipizide and metformin .  Declining Accu-Cheks.  Eating regular diet.  Discontinue treatment for now.  Oral ulcers: Started on doxycycline  and Augmentin.  He has a localized ulcer on the lower lip.  Discontinue doxycycline .  Augmentin for total 5 days.  Local lidocaine  jelly.  Hypertension: Blood pressure stable on lisinopril .  Hyperlipidemia: On a statin.  Vitamin D  deficiency: On vitamin D .    DVT prophylaxis: enoxaparin  (LOVENOX ) injection 40 mg Start: 03/08/24 0800   Code Status: Full code Family Communication: None at the bedside Disposition Plan: Status is: Inpatient Remains inpatient appropriate because: Medically stable.  Adequate to discharge to a SNF when bed available.     Consultants:   Psychiatry Critical care Neurology  Procedures:  MRI under sedation  Antimicrobials:  Augmentin and doxycycline  11/4---     Objective: Vitals:   03/14/24 1319 03/14/24 1704 03/14/24 2111 03/15/24 0543  BP: (!) 87/59 121/76 112/84 112/75  Pulse: 91 84 91 78  Resp: 18 16 18 18   Temp: 97.8 F (36.6 C) (!) 97.3 F (36.3 C) 98.2 F (36.8 C) 98.6 F (37 C)  TempSrc:      SpO2: 94% 93% 93% 97%  Weight:      Height:        Intake/Output Summary (Last 24 hours) at 03/15/2024 1101 Last data filed at 03/15/2024 0900 Gross per 24 hour  Intake 360 ml  Output 800 ml  Net -440 ml   Filed Weights   03/07/24 2229  Weight: 87.4 kg    Examination:  General exam: Calm and comfortable.  Pleasant and interactive.  Patient has a dime sized ulcer on his inner lower lip. Respiratory system: Clear to auscultation. Respiratory effort normal.  No added sounds. Cardiovascular system: S1 & S2 heard, RRR. No JVD, murmurs, rubs, gallops or clicks. No pedal edema. Gastrointestinal system: Abdomen is nondistended, soft and nontender. No  organomegaly or masses felt. Normal bowel sounds heard. Central nervous system: Alert and awake.  Mostly oriented.  He had some resting tremors more pronounced on the right hand.  He has equal strength on all extremities. In normal mood.    Data Reviewed: I have personally reviewed following labs and imaging studies  CBC: Recent Labs  Lab 03/09/24 0821 03/12/24 0429 03/13/24 1442 03/14/24 0305  WBC 6.4 5.9 6.1 4.8  HGB 15.9 15.2 14.1 14.6  HCT 47.9 47.6 44.5 45.7  MCV 86.0 86.5 87.6 88.9  PLT 260 222 213 182   Basic Metabolic Panel: Recent Labs  Lab 03/09/24 0821 03/12/24 0429 03/13/24 1442 03/14/24 0305  NA 138 139 137 138  K 4.2 3.9 4.1 3.9  CL 101 101 103 102  CO2 25 28 26 28   GLUCOSE 82 78 85 83  BUN 19 14 15 12   CREATININE 0.92 1.02 0.87 0.80  CALCIUM  9.6 9.7 9.1 9.3   GFR: Estimated Creatinine Clearance: 102.7 mL/min (by C-G formula based on SCr of 0.8 mg/dL). Liver Function Tests: No results for input(s): AST, ALT, ALKPHOS, BILITOT, PROT, ALBUMIN in the last 168 hours.  No results for input(s): LIPASE, AMYLASE in the last 168 hours. No results for input(s): AMMONIA in the last 168 hours.  Coagulation Profile: No results for input(s): INR, PROTIME in the last 168 hours. Cardiac Enzymes: No results for input(s): CKTOTAL, CKMB, CKMBINDEX, TROPONINI in the last 168 hours. BNP (last 3 results) No results for input(s): PROBNP in the last 8760 hours. HbA1C: No results for input(s): HGBA1C in the last 72 hours. CBG: Recent Labs  Lab 03/13/24 2245 03/14/24 0304 03/14/24 1702 03/14/24 2203 03/15/24 0800  GLUCAP 94 87 108* 154* 113*   Lipid Profile: No results for input(s): CHOL, HDL, LDLCALC, TRIG, CHOLHDL, LDLDIRECT in the last 72 hours. Thyroid  Function Tests: No results for input(s): TSH, T4TOTAL, FREET4, T3FREE, THYROIDAB in the last 72 hours. Anemia Panel: No results for input(s):  VITAMINB12, FOLATE, FERRITIN, TIBC, IRON, RETICCTPCT in the last 72 hours. Sepsis Labs: No results for input(s): PROCALCITON, LATICACIDVEN in the last 168 hours.  Recent Results (from the past 240 hours)  MRSA Next Gen by PCR, Nasal     Status: Abnormal   Collection Time: 03/13/24  2:17 PM   Specimen: Nasal Mucosa; Nasal Swab  Result Value Ref Range Status   MRSA by PCR Next Gen DETECTED (A) NOT DETECTED Final    Comment: (NOTE) The GeneXpert MRSA Assay (FDA approved for NASAL specimens only), is one component of a comprehensive MRSA colonization surveillance program. It is not intended to diagnose MRSA infection nor to guide or monitor treatment for MRSA infections. Test performance is not FDA approved in patients less than 59 years old. Performed at [facility], [address], [place], [place] [address]          Radiology Studies: No results found.       Scheduled Meds:  amoxicillin-clavulanate  1 tablet Oral Q12H   atorvastatin   20 mg Oral Daily   benztropine   1 mg Oral BID   Chlorhexidine Gluconate Cloth  6 each Topical Daily   divalproex   750 mg Oral BID  DULoxetine   40 mg Oral BID   enoxaparin  (LOVENOX ) injection  40 mg Subcutaneous Q24H   hydrOXYzine   50 mg Oral QHS   valbenazine   80 mg Oral Daily   Vitamin D  (Ergocalciferol )  50,000 Units Oral Q7 days   Continuous Infusions:   LOS: 8 days       Renato Applebaum, MD [facility]

## 2024-03-15 NOTE — Plan of Care (Signed)
   Problem: Health Behavior/Discharge Planning: Goal: Ability to manage health-related needs will improve Outcome: Progressing   Problem: Clinical Measurements: Goal: Ability to maintain clinical measurements within normal limits will improve Outcome: Progressing Goal: Will remain free from infection Outcome: Progressing Goal: Diagnostic test results will improve Outcome: Progressing Goal: Respiratory complications will improve Outcome: Progressing

## 2024-03-15 NOTE — Progress Notes (Signed)
 Mobility Specialist Progress Note:   03/15/24 1427  Mobility  Activity Ambulated with assistance  Level of Assistance Moderate assist, patient does 50-74%  Assistive Device Front wheel walker  Distance Ambulated (ft) 85 ft  Activity Response Tolerated fair  Mobility Referral Yes  Mobility visit 1 Mobility  Mobility Specialist Start Time (ACUTE ONLY) 1348  Mobility Specialist Stop Time (ACUTE ONLY) 1416  Mobility Specialist Time Calculation (min) (ACUTE ONLY) 28 min   Pt was received in bed and agreed to mobility. Mod A bed mobility and ambulation. Returned to use the bathroom. Then returned to recliner with all needs met. Call bell in reach.  [employer] - Mobility Specialist

## 2024-03-15 NOTE — Plan of Care (Signed)

## 2024-03-16 DIAGNOSIS — G9340 Encephalopathy, unspecified: Secondary | ICD-10-CM | POA: Diagnosis not present

## 2024-03-16 LAB — GLUCOSE, CAPILLARY
Glucose-Capillary: 101 mg/dL — ABNORMAL HIGH (ref 70–99)
Glucose-Capillary: 144 mg/dL — ABNORMAL HIGH (ref 70–99)
Glucose-Capillary: 127 mg/dL — ABNORMAL HIGH (ref 70–99)
Glucose-Capillary: 109 mg/dL — ABNORMAL HIGH (ref 70–99)

## 2024-03-16 LAB — MRSA NEXT GEN BY PCR, NASAL: MRSA by PCR Next Gen: DETECTED — AB

## 2024-03-16 NOTE — Progress Notes (Signed)
 Physical Therapy Treatment Patient Details Name: [name] [name] MRN: [id number] DOB: 1965/07/02 Today's Date: 03/16/2024   History of Present Illness Pt is a 58 y/o male presenting on 10/24 for fall and chronic back pain. 10/19 psychiatry consulted due to suicidal thoughts, cleared on 10/22. Change in mental status due to acute delirium, and admitted from ED with acute metabolic encephalopathy. CT negative, EEG negative, MRI pending, chest x ray with hypoventilation. PMH includes: bipolar disorder type 1, tardive dyskinesia, PTSD, MDD, CAD, DM2, OA, tremors.    PT Comments  Today's PT session focused on ambulation and functional sit to stand transfers from EOB. Pt ambulated ~148ft with CGA and use of RW. Performed multiple sit to stands from EOB with focus on power up to stand and anterior weight shift (pt demonstrates posterior lean upon standing with poor compensatory strategies leading to posterior LOB episodes) cues provided for hand placement, pt demonstrated intermittent carryover of education of hand placement ~4/12 times during reps- other times able to correct with mostly verbal at times visual cuing required. Pt will benefit from continued skilled PT to increase their independence and maximize safety with mobility.      If plan is discharge home, recommend the following: Assistance with cooking/housework;Assist for transportation;Help with stairs or ramp for entrance;A little help with walking and/or transfers;A little help with bathing/dressing/bathroom;Direct supervision/assist for medications management   Can travel by private vehicle     Yes  Equipment Recommendations  None recommended by PT (pt reports he ha RW)    Recommendations for Other Services       Precautions / Restrictions Precautions Precautions: Fall Recall of Precautions/Restrictions: Impaired Precaution/Restrictions Comments: myoclonic jerking Restrictions Weight Bearing Restrictions Per Provider Order: No      Mobility  Bed Mobility Overal bed mobility: Needs Assistance Bed Mobility: Supine to Sit, Sit to Supine     Supine to sit: Contact guard, HOB elevated, Used rails Sit to supine: Contact guard assist, HOB elevated   General bed mobility comments: HOB slightly elevated, use of bed rail.    Transfers Overall transfer level: Needs assistance Equipment used: Rolling walker (2 wheels) Transfers: Sit to/from Stand Sit to Stand: Min assist           General transfer comment: x12 total; x10 for intervention due to decreased eccentric control with sit to/frim stand and increased difficulty with standing from EOB and pt with use of B UEs pulling up on RW and wheels lifting from floor, required multimodal cuing for problem solving and correction. Intermittent posterior leaning and x3 episodes of significant posterior lean when attempting to rise to stand resulting in decreased eccentric sit back to EOB.    Ambulation/Gait Ambulation/Gait assistance: Contact guard assist Gait Distance (Feet): 180 Feet Assistive device: Rolling walker (2 wheels) Gait Pattern/deviations: Step-through pattern, Decreased stride length, Narrow base of support (intermittent narrow BOS) Gait velocity: decreased     General Gait Details: intermittent narrow BOS leading to multiple mild lateral LOB episodes. Noted decrease in gait speed with increased distance, pt reporting increase in back pain with ambulation. Instability noted with turns. Dual tasking with conversation with therapist during- intermittently stopping when having to think of response.   Stairs             Wheelchair Mobility     Tilt Bed    Modified Rankin (Stroke Patients Only)       Balance  Communication Communication Communication: Impaired Factors Affecting Communication: Reduced clarity of speech  Cognition Arousal: Alert Behavior During Therapy: Flat  affect   PT - Cognitive impairments: No family/caregiver present to determine baseline, Orientation, Problem solving   Orientation impairments: Place, Time, Situation                   PT - Cognition Comments: able to state he is at [place] but unable to verbalize that [place] is hospital setting, stating 25 when asked yr and 25 again when asked month x2- required reorientation. unsteady in standing but no compensatory reactions Following commands: Impaired Following commands impaired: Follows one step commands with increased time, Follows multi-step commands inconsistently    Cueing Cueing Techniques: Verbal cues, Tactile cues, Gestural cues, Visual cues  Exercises Other Exercises Other Exercises: sit to stand from EOB- single UE support on bed and opposite on RW- cues for hand placement and upright posture/forward gaze to promote anterior weight shifitng.    General Comments        Pertinent Vitals/Pain Pain Assessment Pain Assessment: Faces Faces Pain Scale: Hurts a little bit Pain Location: back and B knees per pt report Pain Descriptors / Indicators: Discomfort, Sore Pain Intervention(s): Limited activity within patient's tolerance, Monitored during session    Home Living                          Prior Function            PT Goals (current goals can now be found in the care plan section) Acute Rehab PT Goals Patient Stated Goal: Decreased back pain; no more falls PT Goal Formulation: With patient Time For Goal Achievement: 03/30/24 Potential to Achieve Goals: Good Progress towards PT goals: Progressing toward goals    Frequency    Min 2X/week      PT Plan      Co-evaluation              AM-PAC PT 6 Clicks Mobility   Outcome Measure  Help needed turning from your back to your side while in a flat bed without using bedrails?: A Little Help needed moving from lying on your back to sitting on the side of a flat bed without using  bedrails?: A Little Help needed moving to and from a bed to a chair (including a wheelchair)?: A Little Help needed standing up from a chair using your arms (e.g., wheelchair or bedside chair)?: A Little Help needed to walk in hospital room?: A Little Help needed climbing 3-5 steps with a railing? : A Lot 6 Click Score: 17    End of Session Equipment Utilized During Treatment: Gait belt Activity Tolerance: Patient tolerated treatment well Patient left: in bed;with call bell/phone within reach;with bed alarm set (pt declined up in recliner at end of session) Nurse Communication: Mobility status PT Visit Diagnosis: History of falling (Z91.81);Unsteadiness on feet (R26.81);Other abnormalities of gait and mobility (R26.89);Muscle weakness (generalized) (M62.81);Repeated falls (R29.6);Pain Pain - Right/Left:  (Bilateral knees and back) Pain - part of body: Leg (Bilateral knees and back)     Time: 8483-8459 PT Time Calculation (min) (ACUTE ONLY): 24 min  Charges:    $Therapeutic Activity: 23-37 mins PT General Charges $$ ACUTE PT VISIT: 1 Visit                    Tinnie BERRY PT, DPT  Acute Rehabilitation Services  Office [phone]  03/16/2024, 4:14 PM

## 2024-03-16 NOTE — Progress Notes (Signed)
 PROGRESS NOTE    [name] [name]  FMW:[id number] DOB: 1965/09/29 DOA: 03/02/2024 PCP: [name], [name]    Brief Narrative:  58 year old with history of bipolar type I disorder, tardive dyskinesia, PTSD, MDD, GAD, type 2 diabetes on glipizide metformin  at home, hyperlipidemia and baseline tremors initially admitted to ED with fall and chronic back pain on 10/12.  Patient has been living with a friend for the last 2 to 3 years, patient was having bizarre behavior so friend did not let him come back to the apartment. Patient was boarded in the emergency room since 10/12. 10/19, psychiatry consulted as patient was endorsing suicidal thoughts-IVC 10/22, psychiatry cleared for discharge. 10/24, patient was noted to be encephalopathic so admitted to the hospital.  Multiple investigations were negative. 11/4, MRI under sedation, transient hypotension and altered mental status-overnight in ICU. 11/5, clinically improved.  Sedation wore off.  Transferred back to MedSurg bed.  Pending safe discharge planning.  Subjective:  Patient seen and examined.  No overnight events.  Patient tells me he has slightly shakiness otherwise he is more or less to his normal self.  He is able to mobilize around.   Assessment & Plan:   Acute metabolic encephalopathy in a patient with underlying psychiatric disorder: Improved. This is likely from sedation, overmedication or behavior changes.  Clinically improved. CT head and subsequent MRI of the brain with no acute findings. TSH 2.6, vitamin B12 573.  EEG x 3 negative for seizure. He had required multiple medications, sedation for MRI resulting in encephalopathy. Now normalized.  Hypoxemia: secondary to hypoventilation and sedation: Improved.  On room air.  Hypotension: Post sedation.  Improved.  Bipolar 1 disorder, major depressive disorder, generalized anxiety disorder: Seen by psychiatry.  On benztropine , duloxetine , Depakote  and hydroxyzine .  Patient on  propranolol  and valbenazine  for essential tremors. Psych adjusted Depakote  doses.   Patient in need for physical therapy rehab.  Waiting for SNF bed.  Type 2 diabetes: Noncompliant to glipizide and metformin .  Declining Accu-Cheks.  Eating regular diet.    Oral ulcers: Started on doxycycline  and Augmentin.  He has a localized ulcer on the lower lip.  Discontinue doxycycline .  Augmentin for total 5 days.  Local lidocaine  jelly.  Hypertension: Blood pressure stable on lisinopril .  Hyperlipidemia: On a statin.  Vitamin D  deficiency: On vitamin D .    DVT prophylaxis: enoxaparin  (LOVENOX ) injection 40 mg Start: 03/08/24 0800   Code Status: Full code Family Communication: None at the bedside Disposition Plan: Status is: Inpatient Remains inpatient appropriate because: Medically stable.  Needs safe housing options for rehab to be discharged from the hospital.     Consultants:   Psychiatry Critical care Neurology  Procedures:  MRI under sedation  Antimicrobials:  Augmentin and doxycycline  11/4---     Objective: Vitals:   03/15/24 0543 03/15/24 1242 03/15/24 1914 03/16/24 0437  BP: 112/75 132/75 111/69 106/67  Pulse: 78 89 78 81  Resp: 18 14 16 16   Temp: 98.6 F (37 C) (!) 97.4 F (36.3 C) 98.2 F (36.8 C) 97.9 F (36.6 C)  TempSrc:      SpO2: 97% 94% 94% 93%  Weight:      Height:        Intake/Output Summary (Last 24 hours) at 03/16/2024 1121 Last data filed at 03/16/2024 0900 Gross per 24 hour  Intake 240 ml  Output --  Net 240 ml   Filed Weights   03/07/24 2229  Weight: 87.4 kg    Examination:  General  exam: Calm and comfortable.  Interactive. Respiratory system: Clear to auscultation. Respiratory effort normal.  No added sounds. Cardiovascular system: S1 & S2 heard, RRR. No JVD, murmurs, rubs, gallops or clicks. No pedal edema. Gastrointestinal system: Abdomen is nondistended, soft and nontender. No organomegaly or masses felt. Normal bowel sounds  heard. Central nervous system: Alert awake and oriented.  He has some resting tremors more pronounced on the right hand.  He has equal strength on all extremities. In normal mood.    Data Reviewed: I have personally reviewed following labs and imaging studies  CBC: Recent Labs  Lab 03/12/24 0429 03/13/24 1442 03/14/24 0305  WBC 5.9 6.1 4.8  HGB 15.2 14.1 14.6  HCT 47.6 44.5 45.7  MCV 86.5 87.6 88.9  PLT 222 213 182   Basic Metabolic Panel: Recent Labs  Lab 03/12/24 0429 03/13/24 1442 03/14/24 0305  NA 139 137 138  K 3.9 4.1 3.9  CL 101 103 102  CO2 28 26 28   GLUCOSE 78 85 83  BUN 14 15 12   CREATININE 1.02 0.87 0.80  CALCIUM  9.7 9.1 9.3   GFR: Estimated Creatinine Clearance: 102.7 mL/min (by C-G formula based on SCr of 0.8 mg/dL). Liver Function Tests: No results for input(s): AST, ALT, ALKPHOS, BILITOT, PROT, ALBUMIN in the last 168 hours.  No results for input(s): LIPASE, AMYLASE in the last 168 hours. No results for input(s): AMMONIA in the last 168 hours.  Coagulation Profile: No results for input(s): INR, PROTIME in the last 168 hours. Cardiac Enzymes: No results for input(s): CKTOTAL, CKMB, CKMBINDEX, TROPONINI in the last 168 hours. BNP (last 3 results) No results for input(s): PROBNP in the last 8760 hours. HbA1C: No results for input(s): HGBA1C in the last 72 hours. CBG: Recent Labs  Lab 03/15/24 0800 03/15/24 1206 03/15/24 1706 03/15/24 2107 03/16/24 0724  GLUCAP 113* 89 149* 119* 101*   Lipid Profile: No results for input(s): CHOL, HDL, LDLCALC, TRIG, CHOLHDL, LDLDIRECT in the last 72 hours. Thyroid  Function Tests: No results for input(s): TSH, T4TOTAL, FREET4, T3FREE, THYROIDAB in the last 72 hours. Anemia Panel: No results for input(s): VITAMINB12, FOLATE, FERRITIN, TIBC, IRON, RETICCTPCT in the last 72 hours. Sepsis Labs: No results for input(s): PROCALCITON,  LATICACIDVEN in the last 168 hours.  Recent Results (from the past 240 hours)  MRSA Next Gen by PCR, Nasal     Status: Abnormal   Collection Time: 03/13/24  2:17 PM   Specimen: Nasal Mucosa; Nasal Swab  Result Value Ref Range Status   MRSA by PCR Next Gen DETECTED (A) NOT DETECTED Final    Comment: (NOTE) The GeneXpert MRSA Assay (FDA approved for NASAL specimens only), is one component of a comprehensive MRSA colonization surveillance program. It is not intended to diagnose MRSA infection nor to guide or monitor treatment for MRSA infections. Test performance is not FDA approved in patients less than 75 years old. Performed at [facility], [address] [place], [place] [address]          Radiology Studies: No results found.       Scheduled Meds:  amoxicillin-clavulanate  1 tablet Oral Q12H   atorvastatin   20 mg Oral Daily   benztropine   1 mg Oral BID   Chlorhexidine Gluconate Cloth  6 each Topical Daily   divalproex   750 mg Oral BID   DULoxetine   40 mg Oral BID   enoxaparin  (LOVENOX ) injection  40 mg Subcutaneous Q24H   hydrOXYzine   50 mg Oral QHS  valbenazine   80 mg Oral Daily   Vitamin D  (Ergocalciferol )  50,000 Units Oral Q7 days   Continuous Infusions:   LOS: 9 days       Renato Applebaum, MD [facility]

## 2024-03-16 NOTE — Plan of Care (Signed)
   Problem: Health Behavior/Discharge Planning: Goal: Ability to manage health-related needs will improve Outcome: Progressing   Problem: Clinical Measurements: Goal: Ability to maintain clinical measurements within normal limits will improve Outcome: Progressing Goal: Will remain free from infection Outcome: Progressing Goal: Diagnostic test results will improve Outcome: Progressing Goal: Respiratory complications will improve Outcome: Progressing

## 2024-03-16 NOTE — Plan of Care (Signed)
   Problem: Health Behavior/Discharge Planning: Goal: Ability to manage health-related needs will improve Outcome: Progressing

## 2024-03-16 NOTE — TOC Progression Note (Signed)
 Transition of Care [facility]) - Progression Note    Patient Details  Name: [name] [name] MRN: [id number] Date of Birth: [birth year]  Transition of Care [facility]) CM/SW Contact  Doneta Glenys DASEN, RN Phone Number: 03/16/2024, 9:01 AM  Clinical Narrative:    Still no bed offers. CM spoke with patient to discuss if he has been able to secure housing upon discharge. Patient states he has not be able to secure housing.  CM reach out to congregation nurse Aloysius Ford via chat to ask if she can arrange hotel stay. Waiting to hear results.                      Expected Discharge Plan and Services                                               Social Drivers of Health (SDOH) Interventions SDOH Screenings   Food Insecurity: Patient Unable To Answer (03/07/2024)  Housing: Unknown (03/07/2024)  Transportation Needs: Patient Unable To Answer (03/07/2024)  Utilities: Patient Unable To Answer (03/07/2024)  Depression (PHQ2-9): High Risk (01/26/2024)  Social Connections: Unknown (09/22/2021)   Received from [facility]  Tobacco Use: Low Risk  (03/13/2024)    Readmission Risk Interventions     No data to display

## 2024-03-17 ENCOUNTER — Inpatient Hospital Stay (HOSPITAL_COMMUNITY)

## 2024-03-17 DIAGNOSIS — G9340 Encephalopathy, unspecified: Secondary | ICD-10-CM | POA: Diagnosis not present

## 2024-03-17 LAB — GLUCOSE, CAPILLARY
Glucose-Capillary: 117 mg/dL — ABNORMAL HIGH (ref 70–99)
Glucose-Capillary: 143 mg/dL — ABNORMAL HIGH (ref 70–99)
Glucose-Capillary: 119 mg/dL — ABNORMAL HIGH (ref 70–99)
Glucose-Capillary: 112 mg/dL — ABNORMAL HIGH (ref 70–99)

## 2024-03-17 NOTE — Progress Notes (Signed)
 Pt noted to have sustained a fall. Pt placed on bedside commode with call bell near him. Pt oriented on how to utilize call bell and noted to be AAOX4 upon assessment. Pt pushed call light, call light answered and was told that a staff member will be there. Upon staff member arriving to pt's room, pt was found standing next to bed stating that he had fallen to the floor and that he hit his head. Dr Raenelle notified, obtained new orders for CT of head and CT of right elbow. No injury or trauma sustained per assessment. Pt c/o of pain of right elbow. Order for CT of right elbow obtained. Call bell in place, bed alarm on, pt continues to be AAOX4.

## 2024-03-17 NOTE — Plan of Care (Signed)
   Problem: Health Behavior/Discharge Planning: Goal: Ability to manage health-related needs will improve Outcome: Progressing   Problem: Clinical Measurements: Goal: Ability to maintain clinical measurements within normal limits will improve Outcome: Progressing Goal: Will remain free from infection Outcome: Progressing Goal: Diagnostic test results will improve Outcome: Progressing

## 2024-03-17 NOTE — Progress Notes (Signed)
 PROGRESS NOTE    [name] [name]  FMW:[id number] DOB: [birth year] DOA: 03/02/2024 PCP: [name], [name]    Brief Narrative:  58 year old with history of bipolar type I disorder, tardive dyskinesia, PTSD, MDD, GAD, type 2 diabetes on glipizide metformin  at home, hyperlipidemia and baseline tremors initially admitted to ED with fall and chronic back pain on 10/12.  Patient has been living with a friend for the last 2 to 3 years, patient was having bizarre behavior so friend did not let him come back to the apartment. Patient was boarded in the emergency room since 10/12. 10/19, psychiatry consulted as patient was endorsing suicidal thoughts-IVC 10/22, psychiatry cleared for discharge. 10/24, patient was noted to be encephalopathic so admitted to the hospital.  Multiple investigations were negative. 11/4, MRI under sedation, transient hypotension and altered mental status-overnight in ICU. 11/5, clinically improved.  Sedation wore off.  Transferred back to MedSurg bed.  Pending safe discharge planning.  Subjective:  Patient seen and examined.  He had uneventful last 24 hours.  However, patient was assisted to bedside commode and he decided to stand up by himself and reach to his phone resulting in fall on the right elbow and fell on the floor. Patient examined after fall, there is some redness on the right elbow, he did not have any evidence of external bleeding.  Skeletal survey including CT head, x-ray of the right elbow is stable without evidence of skeletal injury. All-time fall precautions. Not safe to discharge independently due to high risk of fall.   Assessment & Plan:   Acute metabolic encephalopathy in a patient with underlying psychiatric disorder: Improved. This is likely from sedation, overmedication or behavior changes.  Clinically improved. CT head and subsequent MRI of the brain with no acute findings. TSH 2.6, vitamin B12 573.  EEG x 3 negative for seizure. He had  required multiple medications, sedation for MRI resulting in encephalopathy. Now normalized.  Hypoxemia: secondary to hypoventilation and sedation: Improved.  On room air.  Hypotension: Post sedation.  Improved.  Bipolar 1 disorder, major depressive disorder, generalized anxiety disorder: Seen by psychiatry.  On benztropine , duloxetine , Depakote  and hydroxyzine .  Patient on propranolol  and valbenazine  for essential tremors. Psych adjusted Depakote  doses.   Patient in need for physical therapy rehab.  Waiting for SNF bed or supervised living.  Type 2 diabetes: Noncompliant to glipizide and metformin .  Declining Accu-Cheks.  Eating regular diet.    Oral ulcers: Started on doxycycline  and Augmentin.  He has a localized ulcer on the lower lip.  Discontinue doxycycline .  Augmentin for total 5 days.  Local lidocaine  jelly.  Improving.  Hypertension: Blood pressure stable on lisinopril .  Hyperlipidemia: On a statin.  Vitamin D  deficiency: On vitamin D .    DVT prophylaxis: enoxaparin  (LOVENOX ) injection 40 mg Start: 03/08/24 0800   Code Status: Full code Family Communication: None at the bedside Disposition Plan: Status is: Inpatient Remains inpatient appropriate because: Medically stable.  Needs safe housing options, supervised living or rehab to be discharged from the hospital.   Consultants:   Psychiatry Critical care Neurology  Procedures:  MRI under sedation  Antimicrobials:  Augmentin and doxycycline  11/4---     Objective: Vitals:   03/16/24 0437 03/16/24 1918 03/17/24 0444 03/17/24 1116  BP: 106/67 109/80 119/76 122/80  Pulse: 81 87 77 90  Resp: 16 16 16 16   Temp: 97.9 F (36.6 C) 97.6 F (36.4 C) (!) 97.4 F (36.3 C) 98 F (36.7 C)  TempSrc:  Axillary  Oral  SpO2: 93% 94% 92% 96%  Weight:      Height:        Intake/Output Summary (Last 24 hours) at 03/17/2024 1150 Last data filed at 03/16/2024 1256 Gross per 24 hour  Intake 120 ml  Output --  Net 120  ml   Filed Weights   03/07/24 2229  Weight: 87.4 kg    Examination:  General exam: Calm and comfortable.  Pleasant interactive.  Flat affect. Respiratory system: Clear to auscultation. Respiratory effort normal.  No added sounds. Cardiovascular system: S1 & S2 heard, RRR. No JVD, murmurs, rubs, gallops or clicks. No pedal edema. Gastrointestinal system: Abdomen is nondistended, soft and nontender. No organomegaly or masses felt. Normal bowel sounds heard. Central nervous system: Alert awake and oriented.  He has some resting tremors on the right hand.  He has equal strength on all extremities. Flat affect.  Normal mood. Redness right elbow without palpable fracture or palpable skin changes.    Data Reviewed: I have personally reviewed following labs and imaging studies  CBC: Recent Labs  Lab 03/12/24 0429 03/13/24 1442 03/14/24 0305  WBC 5.9 6.1 4.8  HGB 15.2 14.1 14.6  HCT 47.6 44.5 45.7  MCV 86.5 87.6 88.9  PLT 222 213 182   Basic Metabolic Panel: Recent Labs  Lab 03/12/24 0429 03/13/24 1442 03/14/24 0305  NA 139 137 138  K 3.9 4.1 3.9  CL 101 103 102  CO2 28 26 28   GLUCOSE 78 85 83  BUN 14 15 12   CREATININE 1.02 0.87 0.80  CALCIUM  9.7 9.1 9.3   GFR: Estimated Creatinine Clearance: 102.7 mL/min (by C-G formula based on SCr of 0.8 mg/dL). Liver Function Tests: No results for input(s): AST, ALT, ALKPHOS, BILITOT, PROT, ALBUMIN in the last 168 hours.  No results for input(s): LIPASE, AMYLASE in the last 168 hours. No results for input(s): AMMONIA in the last 168 hours.  Coagulation Profile: No results for input(s): INR, PROTIME in the last 168 hours. Cardiac Enzymes: No results for input(s): CKTOTAL, CKMB, CKMBINDEX, TROPONINI in the last 168 hours. BNP (last 3 results) No results for input(s): PROBNP in the last 8760 hours. HbA1C: No results for input(s): HGBA1C in the last 72 hours. CBG: Recent Labs  Lab  03/16/24 0724 03/16/24 1124 03/16/24 1701 03/16/24 2125 03/17/24 0744  GLUCAP 101* 144* 127* 109* 117*   Lipid Profile: No results for input(s): CHOL, HDL, LDLCALC, TRIG, CHOLHDL, LDLDIRECT in the last 72 hours. Thyroid  Function Tests: No results for input(s): TSH, T4TOTAL, FREET4, T3FREE, THYROIDAB in the last 72 hours. Anemia Panel: No results for input(s): VITAMINB12, FOLATE, FERRITIN, TIBC, IRON, RETICCTPCT in the last 72 hours. Sepsis Labs: No results for input(s): PROCALCITON, LATICACIDVEN in the last 168 hours.  Recent Results (from the past 240 hours)  MRSA Next Gen by PCR, Nasal     Status: Abnormal   Collection Time: 03/13/24  2:17 PM   Specimen: Nasal Mucosa; Nasal Swab  Result Value Ref Range Status   MRSA by PCR Next Gen DETECTED (A) NOT DETECTED Final    Comment: (NOTE) The GeneXpert MRSA Assay (FDA approved for NASAL specimens only), is one component of a comprehensive MRSA colonization surveillance program. It is not intended to diagnose MRSA infection nor to guide or monitor treatment for MRSA infections. Test performance is not FDA approved in patients less than 43 years old. Performed at [facility], [address], [place], [place] [address]   MRSA Next Gen by  PCR, Nasal     Status: Abnormal   Collection Time: 03/16/24  2:20 PM   Specimen: Nasal Mucosa; Nasal Swab  Result Value Ref Range Status   MRSA by PCR Next Gen DETECTED (A) NOT DETECTED Final    Comment: (NOTE) The GeneXpert MRSA Assay (FDA approved for NASAL specimens only), is one component of a comprehensive MRSA colonization surveillance program. It is not intended to diagnose MRSA infection nor to guide or monitor treatment for MRSA infections. Test performance is not FDA approved in patients less than 81 years old. Performed at [facility], [address] [place], [place] [address]          Radiology  Studies: CT HEAD WO CONTRAST ( ) Result Date: 03/17/2024 EXAM: CT HEAD WITHOUT CONTRAST 03/17/2024 11:03:12 AM TECHNIQUE: CT of the head was performed without the administration of intravenous contrast. Automated exposure control, iterative reconstruction, and/or weight based adjustment of the mA/kV was utilized to reduce the radiation dose to as low as reasonably achievable. COMPARISON: MRI 03/13/2024. Head CT 03/07/2024 and earlier. CLINICAL HISTORY: 58 year old male recurrent falls, trauma. FINDINGS: BRAIN AND VENTRICLES: No acute hemorrhage. No evidence of acute infarct. No hydrocephalus. No extra-axial collection. No mass effect or midline shift. Stable brain volume. Mild asymmetry of the lateral ventricles is stable and might be normal variation. Gray white differentiation stable and within normal limits for age. Calcified atherosclerosis at the skull base. No suspicious intracranial vascular hyperdensity. ORBITS: No acute abnormality. SINUSES: Visible paranasal sinuses, middle ears and mastoids remain well aerated. SOFT TISSUES AND SKULL: No acute soft tissue abnormality, including orbital and scalp soft tissue injury. No skull fracture. IMPRESSION: 1. No acute traumatic injury identified. 2. Stable and negative for age non-contrast CT appearance of the brain. Electronically signed by: Helayne Hurst MD 03/17/2024 11:16 AM EST RP Workstation: [id number]         Scheduled Meds:  atorvastatin   20 mg Oral Daily   benztropine   1 mg Oral BID   Chlorhexidine Gluconate Cloth  6 each Topical Daily   divalproex   750 mg Oral BID   DULoxetine   40 mg Oral BID   enoxaparin  (LOVENOX ) injection  40 mg Subcutaneous Q24H   hydrOXYzine   50 mg Oral QHS   valbenazine   80 mg Oral Daily   Vitamin D  (Ergocalciferol )  50,000 Units Oral Q7 days   Continuous Infusions:   LOS: 10 days       Renato Applebaum, MD [facility]

## 2024-03-17 NOTE — Plan of Care (Signed)

## 2024-03-18 DIAGNOSIS — G9340 Encephalopathy, unspecified: Secondary | ICD-10-CM | POA: Diagnosis not present

## 2024-03-18 LAB — GLUCOSE, CAPILLARY
Glucose-Capillary: 120 mg/dL — ABNORMAL HIGH (ref 70–99)
Glucose-Capillary: 174 mg/dL — ABNORMAL HIGH (ref 70–99)
Glucose-Capillary: 130 mg/dL — ABNORMAL HIGH (ref 70–99)
Glucose-Capillary: 148 mg/dL — ABNORMAL HIGH (ref 70–99)

## 2024-03-18 NOTE — Hospital Course (Addendum)
 58yo with h/o type 1 bipolar d/o, tardive dyskinesia, T2DM, frequent falls, and HLD who presented on 10/18 with AMS. He was initially boarded in the ER and was eventually admitted for evaluation of encephalopathy on 10/24. He lacked capacity early on but the state denied guardianship and he now has capacity. He is awaiting group home placement, currently scheduled for 1/29.

## 2024-03-18 NOTE — Plan of Care (Signed)
  Problem: Clinical Measurements: Goal: Ability to maintain clinical measurements within normal limits will improve Outcome: Progressing   Problem: Pain Managment: Goal: General experience of comfort will improve and/or be controlled Outcome: Progressing   Problem: Safety: Goal: Ability to remain free from injury will improve Outcome: Progressing   Problem: Skin Integrity: Goal: Risk for impaired skin integrity will decrease Outcome: Progressing

## 2024-03-18 NOTE — Progress Notes (Signed)
 PROGRESS NOTE [name] [name]  FMW:[id number] DOB: 01-26-1966 DOA: 03/02/2024 PCP: [name], [name]  Brief Narrative/Hospital Course: [name] [name] is a 58 y.o. male with PMH of  of bipolar type I disorder, tardive dyskinesia, PTSD, MDD, GAD, type 2 diabetes on glipizide metformin  at home, hyperlipidemia and baseline tremors initially admitted to ED with fall and chronic back pain on 10/12.  Patient has been living with a friend for the last 2 to 3 years, patient was having bizarre behavior so friend did not let him come back to the apartment. Patient was boarded in the emergency room since 10/12. 10/19, psychiatry consulted as patient was endorsing suicidal thoughts-IVC 10/22, psychiatry cleared for discharge. 10/24, patient was noted to be encephalopathic so admitted to the hospital.  Multiple investigations were negative. 11/4, MRI under sedation, transient hypotension and altered mental status-overnight in ICU. 11/5, clinically improved.  Sedation wore off.  Transferred back to MedSurg bed.  Pending safe discharge planning.  Subjective: Seen and examined today Alert awake able to answer questions but does not seem very motivated to interact Has no complaints Overnight on room air afebrile, VSS 120s-150 systolic, Labs blood sugar 120s  Assessment and plan:  Acute metabolic encephalopathy in a patient with underlying psychiatric disorder: likely from sedation, overmedication or behavior changes and has resolved.He had required multiple medications, sedation for MRI resulting in encephalopathy. W/u- CT head and subsequent MRI of the brain no acute findings. TSH 2.6, vitamin B12 573.  EEG x 3 negative for seizure. Continue PT OT  Hypoxemia: secondary to hypoventilation and sedation: Improved.  On room air.   Hypotension 2/2 sedation History of hypertension: Improved.  Home lisinopril  on hold   Bipolar 1 disorder MDD GAD: Seen by psychiatry.  Overall currently stable, meds  adjusted continue with Depakote  750 M G BIDy, Cogentin  1 mg bid, Atarax  50 mg qhs Ingrezza  80mg  daily, Cymbalta , PRN trazodone at bedtime prn, Haldol PRN but has not needed since 11/4 Patient in need for physical therapy rehab.  Awaiting safe disposition    Type 2 diabetes: Noncompliant to glipizide and metformin .  CBG stable on carb modified diet.  He is noncompliant to cbg at times Recent Labs  Lab 03/17/24 0744 03/17/24 1153 03/17/24 1656 03/17/24 2121 03/18/24 0731  GLUCAP 117* 143* 119* 112* 120*      Oral ulcers: has a localized ulcer on the lower lip.  Continue Augmentin x 5 days local lidocaine  jelly   Hyperlipidemia: On a statin.   Vitamin D  deficiency: On vitamin D  supple supplement.   Mobility: PT Orders: Active PT Follow up Rec: Skilled Nursing-Short Term Rehab (<3 Hours/Day)03/16/2024 1500   DVT prophylaxis: enoxaparin  (LOVENOX ) injection 40 mg Start: 03/08/24 0800 Code Status:   Code Status: Full Code Family Communication: plan of care discussed with patient at bedside. Patient status is: Remains hospitalized because of severity of illness Level of care: Med-Surg   Dispo: Anticipated disposition: Awaiting safe disposition to SNF.  Patient remains medically stable for discharge.  Needs safe housing option supervised living at rehab.  Objective: Vitals last 24 hrs: Vitals:   03/18/24 0217 03/18/24 0350 03/18/24 0603 03/18/24 0831  BP: (!) 135/97 (!) 150/77 119/87 121/82  Pulse: 66 76 70 78  Resp: 18 19 18 18   Temp: 98.1 F (36.7 C) 97.7 F (36.5 C) 97.9 F (36.6 C) 97.7 F (36.5 C)  TempSrc: Axillary Oral Axillary Oral  SpO2: 94% 95% 91% 95%  Weight:      Height:  Physical Examination: General exam: alert awake, oriented, older than stated age HEENT:Oral mucosa moist, Ear/Nose WNL grossly Respiratory system: Bilaterally clear BS,no use of accessory muscle Cardiovascular system: S1 & S2 +, No JVD. Gastrointestinal system: Abdomen soft,NT,ND,  BS+ Nervous System: Alert, awake, moving all extremities,and following commands. Extremities: extremities warm, leg edema neg Skin: Warm, no rashes MSK: Normal muscle bulk,tone, power   Medications reviewed:  Scheduled Meds:  atorvastatin   20 mg Oral Daily   benztropine   1 mg Oral BID   Chlorhexidine Gluconate Cloth  6 each Topical Daily   divalproex   750 mg Oral BID   DULoxetine   40 mg Oral BID   enoxaparin  (LOVENOX ) injection  40 mg Subcutaneous Q24H   hydrOXYzine   50 mg Oral QHS   valbenazine   80 mg Oral Daily   Vitamin D  (Ergocalciferol )  50,000 Units Oral Q7 days   Continuous Infusions: Diet: Diet Order             Diet Carb Modified Fluid consistency: Thin; Room service appropriate? Yes  Diet effective now                    Data Reviewed: I have personally reviewed following labs and imaging studies ( see epic result tab) CBC: Recent Labs  Lab 03/12/24 0429 03/13/24 1442 03/14/24 0305  WBC 5.9 6.1 4.8  HGB 15.2 14.1 14.6  HCT 47.6 44.5 45.7  MCV 86.5 87.6 88.9  PLT 222 213 182   CMP: Recent Labs  Lab 03/12/24 0429 03/13/24 1442 03/14/24 0305  NA 139 137 138  K 3.9 4.1 3.9  CL 101 103 102  CO2 28 26 28   GLUCOSE 78 85 83  BUN 14 15 12   CREATININE 1.02 0.87 0.80  CALCIUM  9.7 9.1 9.3   GFR: Estimated Creatinine Clearance: 102.7 mL/min (by C-G formula based on SCr of 0.8 mg/dL). No results for input(s): AST, ALT, ALKPHOS, BILITOT, PROT, ALBUMIN in the last 168 hours. No results for input(s): LIPASE, AMYLASE in the last 168 hours. No results for input(s): AMMONIA in the last 168 hours. Coagulation Profile: No results for input(s): INR, PROTIME in the last 168 hours. Unresulted Labs (From admission, onward)    None      Antimicrobials/Microbiology: Anti-infectives (From admission, onward)    Start     Dose/Rate Route Frequency Ordered Stop   03/12/24 1515  amoxicillin-clavulanate (AUGMENTIN) 875-125 MG per tablet 1 tablet         1 tablet Oral Every 12 hours 03/12/24 1420 03/17/24 0959   03/12/24 1515  doxycycline  (VIBRA -TABS) tablet 100 mg  Status:  Discontinued        100 mg Oral Every 12 hours 03/12/24 1420 03/15/24 1100      No results found for: SDES, SPECREQUEST, CULT, REPTSTATUS  Procedures: Procedure(s) (LRB): MRI WITH ANESTHESIA (N/A)   Mennie LAMY, MD [facility] 03/18/2024, 11:56 AM

## 2024-03-19 DIAGNOSIS — G9340 Encephalopathy, unspecified: Secondary | ICD-10-CM | POA: Diagnosis not present

## 2024-03-19 LAB — GLUCOSE, CAPILLARY
Glucose-Capillary: 123 mg/dL — ABNORMAL HIGH (ref 70–99)
Glucose-Capillary: 128 mg/dL — ABNORMAL HIGH (ref 70–99)
Glucose-Capillary: 116 mg/dL — ABNORMAL HIGH (ref 70–99)
Glucose-Capillary: 139 mg/dL — ABNORMAL HIGH (ref 70–99)
Glucose-Capillary: 136 mg/dL — ABNORMAL HIGH (ref 70–99)

## 2024-03-19 NOTE — Congregational Nurse Program (Unsigned)
  Dept: [phone]   Congregational Nurse Program Note  Date of Encounter: 03/19/2024  Past Medical History: Past Medical History:  Diagnosis Date   Anxiety    Arthritis    Chest heaviness    Chills    Depression, major    Diabetes mellitus without complication (HCC)    Dizzy    Dyslipidemia    ED (erectile dysfunction)    Elevated CPK    GERD (gastroesophageal reflux disease)    HA (headache)    Hypertension    Hypertriglyceridemia    Insomnia    Kidney stone    Left-sided Bell's palsy    Morbid obesity (HCC)    Nausea    No appetite    Panniculitis    of right lower leg   Parkinson's disease (HCC)    PTSD (post-traumatic stress disorder)    Routine screening for STI (sexually transmitted infection) 01/05/2023   Weakness     Encounter Details:  Community Questionnaire - 03/19/24 1334       Questionnaire   Ask client: Do you give verbal consent for me to treat you today? N/A    Student Assistance N/A    Location Patient Served  [facility]    Encounter Setting Hospital;Phone/Text/Email    Population Status Unhoused    Insurance Medicaid    Insurance/Financial Assistance Referral [place] Patient Asst. Fund    Medication N/A    Medical Provider Yes    Screening Referrals Made N/A    Medical Referrals Made N/A    Medical Appointment Completed N/A    CNP Interventions Navigate Healthcare System;Case Management    Screenings CN Performed N/A    ED Visit Averted N/A    Life-Saving Intervention Made Yes         RN contacted by Glenys CM with [facility] hospital on behalf of client. Requesting client receive hotel housing. RN able to review chart and noted that client had two falls since October 2025. He also has had bizarre behavior, with agitation and unsteady gait requiring some assistance with ADL's. RN is concerned about client safety (due to high fall risk) with hotel housing as RN's within [facility] are not home health RN's and do not assist clients with ADL's or provide  around the clock care that client may need to some extent. RN spoke to CM about concerns with hotel housing and states that she will arrange PT/OT/SW/Home health aid and RN to potentially have him seen by agencies between five - seven days a week. Once [facility] receives confirmation of services he can receive hotel housing from [facility] team. RN will continue to follow.

## 2024-03-19 NOTE — Plan of Care (Signed)
  Problem: Nutrition: Goal: Adequate nutrition will be maintained Outcome: Progressing   Problem: Coping: Goal: Level of anxiety will decrease Outcome: Progressing   Problem: Elimination: Goal: Will not experience complications related to urinary retention Outcome: Progressing   Problem: Pain Managment: Goal: General experience of comfort will improve and/or be controlled Outcome: Progressing   Problem: Skin Integrity: Goal: Risk for impaired skin integrity will decrease Outcome: Progressing

## 2024-03-19 NOTE — Plan of Care (Signed)
   Problem: Clinical Measurements: Goal: Diagnostic test results will improve Outcome: Progressing

## 2024-03-19 NOTE — TOC Progression Note (Addendum)
 Transition of Care [facility]) - Progression Note    Patient Details  Name: [name] [name] MRN: [id number] Date of Birth: [birth year]  Transition of Care [facility] CM/SW Contact  Doneta Glenys DASEN, RN Phone Number: 03/19/2024, 9:43 AM  Clinical Narrative:    CM called [facility] [phone], will call back with potential plan for discharge. 10:45 AM [facility] called back to say that they would not feel comfort settting patitien tup in an Extende Stay due tosafety with ambulation. CM discussed if we are able to max out [facility] HH angency to visit with patient at least 5x in a week would this change plan. CM requested provider to add [facility] order for PT/OT/Aid/RN/SW. 11:40 AM CM sent referrals out via the HUB for [facility].    Barriers to Discharge: SNF Pending bed offer, Family Issues               Expected Discharge Plan and Services                                               Social Drivers of Health (SDOH) Interventions SDOH Screenings   Food Insecurity: Patient Unable To Answer (03/07/2024)  Housing: Unknown (03/07/2024)  Transportation Needs: Patient Unable To Answer (03/07/2024)  Utilities: Patient Unable To Answer (03/07/2024)  Depression (PHQ2-9): High Risk (01/26/2024)  Social Connections: Unknown (09/22/2021)   Received from [facility]  Tobacco Use: Low Risk  (03/13/2024)    Readmission Risk Interventions     No data to display

## 2024-03-19 NOTE — Progress Notes (Signed)
 PROGRESS NOTE [name] [name]  FMW:[id number] DOB: 1965/06/10 DOA: 03/02/2024 PCP: [name], [name]  Brief Narrative/Hospital Course: [name] [name] is a 58 y.o. male with PMH of  of bipolar type I disorder, tardive dyskinesia, PTSD, MDD, GAD, type 2 diabetes on glipizide metformin  at home, hyperlipidemia and baseline tremors initially admitted to ED with fall and chronic back pain on 10/12.  Patient has been living with a friend for the last 2 to 3 years, patient was having bizarre behavior so friend did not let him come back to the apartment. Patient was boarded in the emergency room since 10/12. 10/19, psychiatry consulted as patient was endorsing suicidal thoughts-IVC 10/22, psychiatry cleared for discharge. 10/24, patient was noted to be encephalopathic so admitted to the hospital.  Multiple investigations were negative. 11/4, MRI under sedation, transient hypotension and altered mental status-overnight in ICU. 11/5, clinically improved.  Sedation wore off.  Transferred back to MedSurg bed.  Pending safe discharge planning.   Subjective: Seen and examined today Alert awake conversant no complaint Resting on the bedside chair comfortable Overnight vital stable afebrile blood sugar in 120s  Assessment and plan  Acute metabolic encephalopathy in a patient with underlying psychiatric disorder: likely from sedation, overmedication or behavior changes and has resolved.He had required multiple medications, sedation for MRI resulting in encephalopathy. W/u- CT head and subsequent MRI of the brain no acute findings. TSH 2.6, vitamin B12 573.  EEG x 3 negative for seizure. Continue PT OT  Hypoxemia: secondary to hypoventilation and sedation: Improved.  On room air.   Hypotension 2/2 sedation History of hypertension: Improved.  Home lisinopril  on hold   Bipolar 1 disorder MDD GAD: Seen by psychiatry currently stable, meds adjusted Continue Depakote  750 M G BIDy, Cogentin  1 mg bid,  Atarax  50 mg qhs Ingrezza  80mg  daily, Cymbalta , PRN trazodone/ PRN Haldol but has not needed since 11/4. Patient in need for physical therapy rehab, awaiting safe disposition and team has decided to discharge to extended stay with [facility] PT OT and RN social worker.   Type 2 diabetes: Noncompliant to glipizide and metformin .  CBG stable on carb modified diet.resume metformin  on dc.  He is noncompliant to cbg at times Recent Labs  Lab 03/18/24 0731 03/18/24 1126 03/18/24 1718 03/18/24 2057 03/19/24 0802  GLUCAP 120* 174* 130* 148* 123*      Oral ulcers: has a localized ulcer on the lower lip.  Continue Augmentin x 5 days local lidocaine  jelly   Hyperlipidemia: On a statin.   Vitamin D  deficiency: On vitamin D  supple supplement.   Mobility: PT Orders: Active PT Follow up Rec: Skilled Nursing-Short Term Rehab (<3 Hours/Day)03/16/2024 1500   DVT prophylaxis: enoxaparin  (LOVENOX ) injection 40 mg Start: 03/08/24 0800 Code Status:   Code Status: Full Code Family Communication: plan of care discussed with patient at bedside. Patient status is: Remains hospitalized because of severity of illness Level of care: Med-Surg   Dispo: Anticipated disposition: awaiting placement  Objective: Vitals last 24 hrs: Vitals:   03/18/24 1214 03/18/24 2057 03/18/24 2200 03/19/24 0440  BP: 132/80 (!) 150/82  134/81  Pulse: 90 74  70  Resp:      Temp: 98 F (36.7 C) 97.8 F (36.6 C)  (!) 97.3 F (36.3 C)  TempSrc: Oral Oral  Oral  SpO2: 95%  95%   Weight:      Height:        Physical Examination: General exam: Alert awake oriented pleasant conversant HEENT:Oral mucosa moist, Ear/Nose  WNL grossly Respiratory system: Bilaterally clear BS,no use of accessory muscle Cardiovascular system: S1 & S2 +, No JVD. Gastrointestinal system: Abdomen soft,NT,ND, BS+ Nervous System: Alert, awake, moving all extremities,and following commands. Extremities: extremities warm, leg edema neg Skin: Warm, no  rashes MSK: Normal muscle bulk,tone, power    Data Reviewed: I have personally reviewed following labs and imaging studies ( see epic result tab) CBC: Recent Labs  Lab 03/13/24 1442 03/14/24 0305  WBC 6.1 4.8  HGB 14.1 14.6  HCT 44.5 45.7  MCV 87.6 88.9  PLT 213 182   CMP: Recent Labs  Lab 03/13/24 1442 03/14/24 0305  NA 137 138  K 4.1 3.9  CL 103 102  CO2 26 28  GLUCOSE 85 83  BUN 15 12  CREATININE 0.87 0.80  CALCIUM  9.1 9.3   GFR: Estimated Creatinine Clearance: 102.7 mL/min (by C-G formula based on SCr of 0.8 mg/dL). No results for input(s): AST, ALT, ALKPHOS, BILITOT, PROT, ALBUMIN in the last 168 hours. No results for input(s): LIPASE, AMYLASE in the last 168 hours. No results for input(s): AMMONIA in the last 168 hours. Coagulation Profile: No results for input(s): INR, PROTIME in the last 168 hours. Unresulted Labs (From admission, onward)    None      Antimicrobials/Microbiology: Anti-infectives (From admission, onward)    Start     Dose/Rate Route Frequency Ordered Stop   03/12/24 1515  amoxicillin-clavulanate (AUGMENTIN) 875-125 MG per tablet 1 tablet        1 tablet Oral Every 12 hours 03/12/24 1420 03/17/24 0959   03/12/24 1515  doxycycline  (VIBRA -TABS) tablet 100 mg  Status:  Discontinued        100 mg Oral Every 12 hours 03/12/24 1420 03/15/24 1100      No results found for: SDES, SPECREQUEST, CULT, REPTSTATUS  Procedures: Procedure(s) (LRB): MRI WITH ANESTHESIA (N/A)   Mennie LAMY, MD [facility] 03/19/2024, 2:55 PM

## 2024-03-19 NOTE — Progress Notes (Signed)
 Mobility Specialist Progress Note:   03/19/24 1137  Mobility  Activity Ambulated with assistance  Level of Assistance Minimal assist, patient does 75% or more  Assistive Device Front wheel walker  Distance Ambulated (ft) 110 ft  Activity Response Tolerated well  Mobility Referral Yes  Mobility visit 1 Mobility  Mobility Specialist Start Time (ACUTE ONLY) 1025  Mobility Specialist Stop Time (ACUTE ONLY) 1045  Mobility Specialist Time Calculation (min) (ACUTE ONLY) 20 min   Pt was received in bed and agreed to mobility. Min A sit to stand and ambulation. Pt had no complaints during session. Returned to use the bathroom and then into the bed. All needs were met, call bell in reach and bed alarm on.  [employer] - Mobility Specialist

## 2024-03-19 NOTE — Progress Notes (Signed)
 PROGRESS NOTE [name] [name]  FMW:[id number] DOB: [birth year] DOA: 03/02/2024 PCP: [name], [name]  Brief Narrative/Hospital Course: [name] [name] is a 58 y.o. male with PMH of  of bipolar type I disorder, tardive dyskinesia, PTSD, MDD, GAD, type 2 diabetes on glipizide metformin  at home, hyperlipidemia and baseline tremors initially admitted to ED with fall and chronic back pain on 10/12.  Patient has been living with a friend for the last 2 to 3 years, patient was having bizarre behavior so friend did not let him come back to the apartment. Patient was boarded in the emergency room since 10/12. 10/19, psychiatry consulted as patient was endorsing suicidal thoughts-IVC 10/22, psychiatry cleared for discharge. 10/24, patient was noted to be encephalopathic so admitted to the hospital.  Multiple investigations were negative. 11/4, MRI under sedation, transient hypotension and altered mental status-overnight in ICU. 11/5, clinically improved.  Sedation wore off.  Transferred back to MedSurg bed.  Pending safe discharge planning.   Subjective: Seen and examined today Alert awake resting comfortably oriented to current place president but not to date. Overnight no acute events blood sugar remains stable is alert awake communicative interactive Dependent to others   Assessment and plan  Acute metabolic encephalopathy in a patient with underlying psychiatric disorder: likely from sedation, overmedication or behavior changes and has resolved.He had required multiple medications, sedation for MRI resulting in encephalopathy. W/u- CT head and subsequent MRI of the brain no acute findings. TSH 2.6, vitamin B12 573.  EEG x 3 negative for seizure. Continue PT OT  Hypoxemia: secondary to hypoventilation and sedation: Improved.  On room air.   Hypotension 2/2 sedation History of hypertension: Improved.  Home lisinopril  on hold   Bipolar 1 disorder MDD GAD: Seen by psychiatry currently  stable, meds adjusted Continue Depakote  750 M G BIDy, Cogentin  1 mg bid, Atarax  50 mg qhs Ingrezza  80mg  daily, Cymbalta , PRN trazodone/ PRN Haldol but has not needed since 11/4. Patient in need for physical therapy rehab, awaiting safe disposition and team has decided to discharge to extended stay with [facility] PT OT and RN social worker.   Type 2 diabetes: Noncompliant to glipizide and metformin .  CBG stable on carb modified diet.resume metformin  on dc.  He is noncompliant to cbg at times Recent Labs  Lab 03/19/24 1157 03/19/24 1651 03/19/24 2105 03/19/24 2211 03/20/24 0744  GLUCAP 128* 116* 139* 136* 132*      Oral ulcers: has a localized ulcer on the lower lip.  Continue Augmentin x 5 days local lidocaine  jelly   Hyperlipidemia: On a statin.   Vitamin D  deficiency: On vitamin D  supple supplement.   Mobility: PT Orders: Active PT Follow up Rec: Skilled Nursing-Short Term Rehab (<3 Hours/Day)03/16/2024 1500   DVT prophylaxis: enoxaparin  (LOVENOX ) injection 40 mg Start: 03/08/24 0800 Code Status:   Code Status: Full Code Family Communication: plan of care discussed with patient at bedside. Patient status is: Remains hospitalized because of severity of illness Level of care: Med-Surg   Dispo: Anticipated disposition: awaiting placement  Objective: Vitals last 24 hrs: Vitals:   03/19/24 1208 03/19/24 2103 03/19/24 2216 03/20/24 0546  BP: 105/68 139/82 (!) 136/94 126/81  Pulse: 96 76 83 72  Resp: 18 15 18 17   Temp: 97.9 F (36.6 C) 98.2 F (36.8 C) 98 F (36.7 C) 97.9 F (36.6 C)  TempSrc:  Oral  Oral  SpO2:   95%   Weight:      Height:  Physical Examination: General exam: Alert awake oriented  HEENT:Oral mucosa moist, Ear/Nose WNL grossly Respiratory system: Bilaterally clear BS,no use of accessory muscle Cardiovascular system: S1 & S2 +, No JVD. Gastrointestinal system: Abdomen soft,NT,ND, BS+ Nervous System: Alert, awake, moving all extremities,and following  commands. Extremities: extremities warm, leg edema neg Skin: Warm, no rashes MSK: Normal muscle bulk,tone, power    Data Reviewed: I have personally reviewed following labs and imaging studies ( see epic result tab) CBC: Recent Labs  Lab 03/13/24 1442 03/14/24 0305  WBC 6.1 4.8  HGB 14.1 14.6  HCT 44.5 45.7  MCV 87.6 88.9  PLT 213 182   CMP: Recent Labs  Lab 03/13/24 1442 03/14/24 0305  NA 137 138  K 4.1 3.9  CL 103 102  CO2 26 28  GLUCOSE 85 83  BUN 15 12  CREATININE 0.87 0.80  CALCIUM  9.1 9.3   GFR: Estimated Creatinine Clearance: 102.7 mL/min (by C-G formula based on SCr of 0.8 mg/dL). No results for input(s): AST, ALT, ALKPHOS, BILITOT, PROT, ALBUMIN in the last 168 hours. No results for input(s): LIPASE, AMYLASE in the last 168 hours. No results for input(s): AMMONIA in the last 168 hours. Coagulation Profile: No results for input(s): INR, PROTIME in the last 168 hours. Unresulted Labs (From admission, onward)    None      Antimicrobials/Microbiology: Anti-infectives (From admission, onward)    Start     Dose/Rate Route Frequency Ordered Stop   03/12/24 1515  amoxicillin-clavulanate (AUGMENTIN) 875-125 MG per tablet 1 tablet        1 tablet Oral Every 12 hours 03/12/24 1420 03/17/24 0959   03/12/24 1515  doxycycline  (VIBRA -TABS) tablet 100 mg  Status:  Discontinued        100 mg Oral Every 12 hours 03/12/24 1420 03/15/24 1100      No results found for: SDES, SPECREQUEST, CULT, REPTSTATUS  Procedures: Procedure(s) (LRB): MRI WITH ANESTHESIA (N/A)   Mennie LAMY, MD [facility] 03/20/2024, 10:43 AM

## 2024-03-20 DIAGNOSIS — G9340 Encephalopathy, unspecified: Secondary | ICD-10-CM | POA: Diagnosis not present

## 2024-03-20 LAB — GLUCOSE, CAPILLARY
Glucose-Capillary: 132 mg/dL — ABNORMAL HIGH (ref 70–99)
Glucose-Capillary: 148 mg/dL — ABNORMAL HIGH (ref 70–99)
Glucose-Capillary: 154 mg/dL — ABNORMAL HIGH (ref 70–99)
Glucose-Capillary: 159 mg/dL — ABNORMAL HIGH (ref 70–99)

## 2024-03-20 NOTE — Plan of Care (Signed)

## 2024-03-20 NOTE — Progress Notes (Signed)
 Pt was being assisted to the bsc when he became unsteady and instead of going on the bsc he sat down on the bench. Pt said he got dizzy. VS and CBG's done and charted.

## 2024-03-20 NOTE — TOC Progression Note (Addendum)
 Transition of Care [facility]) - Progression Note    Patient Details  Name: [name] [name] MRN: [id number] Date of Birth: July 22, 1965  Transition of Care [facility]) CM/SW Contact  Doneta Glenys DASEN, RN Phone Number: 03/20/2024, 9:27 AM  Clinical Narrative:    CM was going to file  a APS report , however due to Federal holiday they are closed. CM has escalated assistance with this case to leadership Jesusa). 2:58 PM CM called [name]) [phone] to ask he patient can stay with her. [name] said she is not able to provide care for patient..     Barriers to Discharge: SNF Pending bed offer, Family Issues               Expected Discharge Plan and Services                                               Social Drivers of Health (SDOH) Interventions SDOH Screenings   Food Insecurity: Patient Unable To Answer (03/07/2024)  Housing: Unknown (03/07/2024)  Transportation Needs: Patient Unable To Answer (03/07/2024)  Utilities: Patient Unable To Answer (03/07/2024)  Depression (PHQ2-9): High Risk (01/26/2024)  Social Connections: Unknown (09/22/2021)   Received from [facility]  Tobacco Use: Low Risk  (03/13/2024)    Readmission Risk Interventions     No data to display

## 2024-03-21 DIAGNOSIS — G9340 Encephalopathy, unspecified: Secondary | ICD-10-CM | POA: Diagnosis not present

## 2024-03-21 LAB — GLUCOSE, CAPILLARY
Glucose-Capillary: 127 mg/dL — ABNORMAL HIGH (ref 70–99)
Glucose-Capillary: 170 mg/dL — ABNORMAL HIGH (ref 70–99)
Glucose-Capillary: 181 mg/dL — ABNORMAL HIGH (ref 70–99)
Glucose-Capillary: 136 mg/dL — ABNORMAL HIGH (ref 70–99)

## 2024-03-21 MED ORDER — CHLORHEXIDINE GLUCONATE CLOTH 2 % EX PADS
6.0000 | MEDICATED_PAD | Freq: Every day | CUTANEOUS | Status: AC
  Administered 2024-03-21 – 2024-03-25 (×5): 6 via TOPICAL

## 2024-03-21 MED ORDER — MUPIROCIN 2 % EX OINT
1.0000 | TOPICAL_OINTMENT | Freq: Two times a day (BID) | CUTANEOUS | Status: AC
  Administered 2024-03-21 – 2024-03-25 (×10): 1 via NASAL
  Filled 2024-03-21 (×5): qty 22

## 2024-03-21 NOTE — Plan of Care (Signed)

## 2024-03-21 NOTE — Progress Notes (Signed)
 Physical Therapy Treatment Patient Details Name: [name] [name] MRN: [id number] DOB: 08-14-1965 Today's Date: 03/21/2024   History of Present Illness Pt is a 58 y/o male presenting on 10/24 for fall and chronic back pain. 10/19 psychiatry consulted due to suicidal thoughts, cleared on 10/22. Change in mental status due to acute delirium, and admitted from ED with acute metabolic encephalopathy. CT negative, EEG negative, MRI pending, chest x ray with hypoventilation. PMH includes: bipolar disorder type 1, tardive dyskinesia, PTSD, MDD, CAD, DM2, OA, tremors.    PT Comments  Pt is progressing poorly with mobility primarily due to very poor standing balance. Pt required mod to max assist for heavy posterior lean while attempting to walk 20' with a RW. Pt shows no compensatory strategies nor awareness of his poor balance. Ambulation distance limited by therapist as pt was not able to maintain upright standing position even with max assist.  This is a decline in activity tolerance as he ambulated 68' with the mobility specialist on 03/19/24.    If plan is discharge home, recommend the following: Assistance with cooking/housework;Assist for transportation;Help with stairs or ramp for entrance;Direct supervision/assist for medications management;A lot of help with walking and/or transfers;A lot of help with bathing/dressing/bathroom   Can travel by private vehicle     No  Equipment Recommendations  None recommended by PT    Recommendations for Other Services       Precautions / Restrictions Precautions Precautions: Fall Recall of Precautions/Restrictions: Impaired Precaution/Restrictions Comments: myoclonic jerking; decreased safety awareness, decreased awareness of deficits Restrictions Weight Bearing Restrictions Per Provider Order: No     Mobility  Bed Mobility               General bed mobility comments: up in recliner    Transfers Overall transfer level: Needs  assistance Equipment used: Rolling walker (2 wheels) Transfers: Sit to/from Stand Sit to Stand: Max assist           General transfer comment: mod to max assist for sit to stand with frequent verbal/manual cues for safe technique, sit to stand x 3 with assist to power up 2* posterior lean. Pt with significant posterior lean in standing requiring mod to max assist to precent fall, no compensatory response from pt, no reaction when instructed to lean forward.    Ambulation/Gait Ambulation/Gait assistance: Mod assist, Max assist Gait Distance (Feet): 20 Feet Assistive device: Rolling walker (2 wheels) Gait Pattern/deviations: Step-through pattern, Decreased stride length, Narrow base of support Gait velocity: decreased     General Gait Details: repeated episodes of heavy lean posteriorly requiring mod to max assist to prevent a fall, pt unable to correct balance with verbal cues, distance limited by therapist 2* very unsteady gait and high fall risk   Stairs             Wheelchair Mobility     Tilt Bed    Modified Rankin (Stroke Patients Only)       Balance Overall balance assessment: Needs assistance Sitting-balance support: No upper extremity supported, Feet supported Sitting balance-Leahy Scale: Fair Sitting balance - Comments: statically min guard for safety   Standing balance support: Bilateral upper extremity supported, During functional activity, Single extremity supported Standing balance-Leahy Scale: Zero Standing balance comment: relies on RW support, posterior lean needing mod to max external assist                            Communication Communication Communication:  Impaired Factors Affecting Communication: Reduced clarity of speech  Cognition Arousal: Alert Behavior During Therapy: Flat affect   PT - Cognitive impairments: No family/caregiver present to determine baseline, Orientation, Awareness, Problem solving, Safety/Judgement                        PT - Cognition Comments: unsteady in standing but no compensatory reactions Following commands: Impaired Following commands impaired: Follows one step commands with increased time, Follows one step commands inconsistently    Cueing Cueing Techniques: Verbal cues, Tactile cues, Gestural cues, Visual cues  Exercises      General Comments        Pertinent Vitals/Pain Pain Assessment Pain Assessment: No/denies pain Faces Pain Scale: No hurt    Home Living                          Prior Function            PT Goals (current goals can now be found in the care plan section) Acute Rehab PT Goals Patient Stated Goal: Decreased back pain; no more falls PT Goal Formulation: With patient Time For Goal Achievement: 03/30/24 Potential to Achieve Goals: Good Progress towards PT goals: Not progressing toward goals - comment (very poor balance limiting progression of mobility)    Frequency    Min 2X/week      PT Plan      Co-evaluation              AM-PAC PT 6 Clicks Mobility   Outcome Measure  Help needed turning from your back to your side while in a flat bed without using bedrails?: A Little Help needed moving from lying on your back to sitting on the side of a flat bed without using bedrails?: A Lot Help needed moving to and from a bed to a chair (including a wheelchair)?: A Lot Help needed standing up from a chair using your arms (e.g., wheelchair or bedside chair)?: Total Help needed to walk in hospital room?: Total Help needed climbing 3-5 steps with a railing? : Total 6 Click Score: 10    End of Session Equipment Utilized During Treatment: Gait belt Activity Tolerance: Patient tolerated treatment well Patient left: with call bell/phone within reach;with nursing/sitter in room;Other (comment) (on bedside commode) Nurse Communication: Mobility status PT Visit Diagnosis: History of falling (Z91.81);Unsteadiness on feet  (R26.81);Other abnormalities of gait and mobility (R26.89);Muscle weakness (generalized) (M62.81);Repeated falls (R29.6);Pain     Time: 1208-1226 PT Time Calculation (min) (ACUTE ONLY): 18 min  Charges:    $Gait Training: 8-22 mins PT General Charges $$ ACUTE PT VISIT: 1 Visit                     [name] PT 03/21/2024  Acute Rehabilitation Services  Office [phone]

## 2024-03-21 NOTE — Plan of Care (Signed)
°  Problem: Health Behavior/Discharge Planning: Goal: Ability to manage health-related needs will improve Outcome: Progressing   Problem: Clinical Measurements: Goal: Will remain free from infection Outcome: Progressing   Problem: Clinical Measurements: Goal: Ability to maintain clinical measurements within normal limits will improve Outcome: Progressing

## 2024-03-21 NOTE — Progress Notes (Signed)
 Patient assisted to bedside commode during shift. Rested all night denies pain in stable condition.

## 2024-03-21 NOTE — Progress Notes (Signed)
 PROGRESS NOTE [name]  FMW:[id number] DOB: 1965-10-30 DOA: 03/02/2024 PCP: [name], [name]  Brief Narrative/Hospital Course: [name] [name] is a 58 y.o. male with PMH of  of bipolar type I disorder, tardive dyskinesia, PTSD, MDD, GAD, type 2 diabetes on glipizide metformin  at home, hyperlipidemia and baseline tremors initially admitted to ED with fall and chronic back pain on 10/12.  Patient has been living with a friend for the last 2 to 3 years, patient was having bizarre behavior so friend did not let him come back to the apartment. Patient was boarded in the emergency room since 10/12. 10/19, psychiatry consulted as patient was endorsing suicidal thoughts-IVC 10/22, psychiatry cleared for discharge. 10/24, patient was noted to be encephalopathic so admitted to the hospital.  Multiple investigations were negative. 11/4, MRI under sedation, transient hypotension and altered mental status-overnight in ICU. 11/5, clinically improved.  Sedation wore off.  Transferred back to MedSurg bed.  Pending safe discharge planning.  Subjective: Seen and examined Alert awake resting comfortably oriented to place and self Nursing states patient needing safety sitter 11/11 as he is impulsive Overnight afebrile BP stable blood sugar 120s ordered labs for am.  Assessment and plan  Acute metabolic encephalopathy in a patient with underlying psychiatric disorder: likely from sedation, overmedication or behavior changes and has resolved.He had required multiple medications, sedation for MRI resulting in encephalopathy. W/u- CT head and subsequent MRI of the brain no acute findings. TSH 2.6, vitamin B12 573.  EEG x 3 negative for seizure. Continue PT OT At times impulsive and placed on TeleSitter 11/11  Hypoxemia: secondary to hypoventilation and sedation: Improved.  On room air.   Hypotension 2/2 sedation History of hypertension: Improved.  Home lisinopril  on hold   Bipolar 1  disorder MDD GAD: Seen by psychiatry currently stable, meds adjusted Continue Depakote  750 M G BIDy, Cogentin  1 mg bid, Atarax  50 mg qhs Ingrezza  80mg  daily, Cymbalta , PRN trazodone/ PRN Haldol but has not needed since 11/4. Patient in need for physical therapy rehab, awaiting safe disposition and team has decided to discharge to extended stay with [facility] PT OT and RN social worker.   Type 2 diabetes: Noncompliant to glipizide and metformin .  CBG stable on carb modified diet.resume metformin  on dc.  He is noncompliant to cbg at times Recent Labs  Lab 03/20/24 0744 03/20/24 1139 03/20/24 1646 03/20/24 1952 03/21/24 0736  GLUCAP 132* 148* 154* 159* 127*      Oral ulcers: has a localized ulcer on the lower lip.  Continue Augmentin x 5 days local lidocaine  jelly   Hyperlipidemia: On a statin.   Vitamin D  deficiency: On vitamin D  supple supplement.   Mobility: PT Orders: Active PT Follow up Rec: Skilled Nursing-Short Term Rehab (<3 Hours/Day)03/16/2024 1500   DVT prophylaxis: enoxaparin  (LOVENOX ) injection 40 mg Start: 03/08/24 0800 Code Status:   Code Status: Full Code Family Communication: plan of care discussed with patient at bedside. Patient status is: Remains hospitalized because of severity of illness Level of care: Med-Surg   Dispo: Anticipated disposition: awaiting placement to SNF, TOC following and APS is being involved  Objective: Vitals last 24 hrs: Vitals:   03/20/24 0546 03/20/24 1256 03/20/24 1951 03/21/24 0642  BP: 126/81 (!) 124/93 (!) 138/93 127/88  Pulse: 72 87 88 72  Resp: 17 17 20    Temp: 97.9 F (36.6 C) 98.2 F (36.8 C) (!) 97.5 F (36.4 C) 97.9 F (36.6 C)  TempSrc: Oral   Oral  SpO2:  91%  100%  Weight:      Height:        Physical Examination: General exam: Alert awake oriented to self/place HEENT:Oral mucosa moist, Ear/Nose WNL grossly Respiratory system: Bilaterally clear BS,no use of accessory muscle Cardiovascular system: S1 & S2 +, No  JVD. Gastrointestinal system: Abdomen soft,NT,ND, BS+ Nervous System: Alert, awake, moving all extremities,and following commands. Extremities: extremities warm, leg edema neg Skin: Warm, no rashes MSK: Normal muscle bulk,tone, power    Data Reviewed: I have personally reviewed following labs and imaging studies ( see epic result tab) CBC: No results for input(s): WBC, NEUTROABS, HGB, HCT, MCV, PLT in the last 168 hours.  CMP: No results for input(s): NA, K, CL, CO2, GLUCOSE, BUN, CREATININE, CALCIUM , MG, PHOS in the last 168 hours.  GFR: Estimated Creatinine Clearance: 102.7 mL/min (by C-G formula based on SCr of 0.8 mg/dL). No results for input(s): AST, ALT, ALKPHOS, BILITOT, PROT, ALBUMIN in the last 168 hours. No results for input(s): LIPASE, AMYLASE in the last 168 hours. No results for input(s): AMMONIA in the last 168 hours. Coagulation Profile: No results for input(s): INR, PROTIME in the last 168 hours. Unresulted Labs (From admission, onward)     Start     Ordered   03/22/24 0500  Comprehensive metabolic panel with GFR  Tomorrow morning,   R       Question:  Specimen collection method  Answer:  Lab=Lab collect   03/21/24 0843   03/22/24 0500  CBC  Tomorrow morning,   R       Question:  Specimen collection method  Answer:  Lab=Lab collect   03/21/24 0843           Antimicrobials/Microbiology: Anti-infectives (From admission, onward)    Start     Dose/Rate Route Frequency Ordered Stop   03/12/24 1515  amoxicillin-clavulanate (AUGMENTIN) 875-125 MG per tablet 1 tablet        1 tablet Oral Every 12 hours 03/12/24 1420 03/17/24 0959   03/12/24 1515  doxycycline  (VIBRA -TABS) tablet 100 mg  Status:  Discontinued        100 mg Oral Every 12 hours 03/12/24 1420 03/15/24 1100      No results found for: SDES, SPECREQUEST, CULT, REPTSTATUS  Procedures: Procedure(s) (LRB): MRI WITH ANESTHESIA (N/A)   Mennie LAMY,  MD [facility] 03/21/2024, 11:25 AM

## 2024-03-21 NOTE — TOC Progression Note (Addendum)
 Transition of Care [facility]) - Progression Note    Patient Details  Name: [name] [name] MRN: [id number] Date of Birth: Jun 20, 1965  [facility] CM/SW Contact  Doneta Glenys DASEN, RN Phone Number: 03/21/2024, 8:57 AM  Clinical Narrative:    CM called Adult Protective Services (APS)  and left message. Waiting on return call. 9:31 AM APS [name] returned call. Ask multiple questions to file report and stated that someone from APS will call today to inform me if they will investigate. 9:58 AM APS [name] called to inform me that patient meets criteria and a social worker will be out to evaluate patient.   Barriers to Discharge: SNF Pending bed offer               Expected Discharge Plan and Services                                               Social Drivers of Health (SDOH) Interventions SDOH Screenings   Food Insecurity: Patient Unable To Answer (03/07/2024)  Housing: Unknown (03/07/2024)  Transportation Needs: Patient Unable To Answer (03/07/2024)  Utilities: Patient Unable To Answer (03/07/2024)  Depression (PHQ2-9): High Risk (01/26/2024)  Social Connections: Unknown (09/22/2021)   Received from [facility]  Tobacco Use: Low Risk  (03/13/2024)    Readmission Risk Interventions     No data to display

## 2024-03-21 NOTE — Progress Notes (Signed)
 Occupational Therapy Treatment Patient Details Name: EDGERRIN CORREIA MRN: [id number] DOB: 1965/10/28 Today's Date: 03/21/2024   History of present illness Pt is a 58 yr old male presenting on 10/24 for fall and chronic back pain. Change in mental status due to acute delirium, and admitted from ED with acute metabolic encephalopathy.  PMH includes: bipolar disorder type 1, tardive dyskinesia, PTSD, MDD, CAD, DM2, OA, tremors.   OT comments  The pt was seen for ADL instruction, functional strengthening and progression of functional activity. He required increased assistance for balance in standing, given noted unsteadiness. He also required max assist for toileting management at bedside commode level. He performed upper body grooming seated in the bedside chair with supervision/set-up. He required intermittent cues throughout for safety awareness, problem solving, sustained attention and sequencing. He was oriented to person and place, however not to time and situation. Continue OT plan of care to maximize the pt's safety and independence with self care tasks. Patient will benefit from continued inpatient follow up therapy, <3 hours/day.       If plan is discharge home, recommend the following:  A lot of help with walking and/or transfers;A lot of help with bathing/dressing/bathroom;Assistance with cooking/housework;Direct supervision/assist for financial management;Direct supervision/assist for medications management;Assist for transportation;Supervision due to cognitive status   Equipment Recommendations  BSC/3in1    Recommendations for Other Services      Precautions / Restrictions Precautions Precautions: Fall Restrictions Weight Bearing Restrictions Per Provider Order: No       Mobility Bed Mobility Overal bed mobility: Needs Assistance Bed Mobility: Supine to Sit     Supine to sit: Mod assist     General bed mobility comments: Pt required assist to manage trunk and to  advance BLE off the bed, as well as general cues for sequencing/transfer technique.    Transfers Overall transfer level: Needs assistance Equipment used: Rolling walker (2 wheels) Transfers: Sit to/from Stand, Bed to chair/wheelchair/BSC Sit to Stand: Mod assist, From elevated surface Stand pivot transfers: Mod assist               Balance     Sitting balance-Leahy Scale: Fair       Standing balance-Leahy Scale: Poor           ADL either performed or assessed with clinical judgement   ADL Overall ADL's : Needs assistance/impaired     Grooming: Set up;Supervision/safety;Sitting Grooming Details (indicate cue type and reason): The pt performed hand and face washing seated in the bedside chair.         Upper Body Dressing : Minimal assistance;Sitting;Cueing for sequencing Upper Body Dressing Details (indicate cue type and reason): He required assist to doff a hospital gown, then to donn another one in sitting EOB.     Toilet Transfer: Moderate assistance;Cueing for sequencing;Cueing for safety;BSC/3in1;Rolling walker (2 wheels);Stand-pivot Statistician Details (indicate cue type and reason): Pt required increased assist for balance/steadying in standing, assist and cues for walker positioning, cues for general safety during transfer, and cues to reach back prior to sitting. Toileting- Clothing Manipulation and Hygiene: Maximal assistance;Cueing for sequencing;Sit to/from stand;Cueing for safety Toileting - Clothing Manipulation Details (indicate cue type and reason): The pt required assist for balance in standing, cues to keep his hands on the walker due to impaired balance, max assist for clothing management and total assist for posterior peri-hygiene in standing. Toileting performed at bedside commode level.  Communication Communication Communication: Impaired Factors Affecting Communication: Reduced clarity of speech   Cognition  Arousal: Alert Behavior During Therapy: Flat affect Cognition: Cognition impaired   Orientation impairments: Time, Situation Awareness: Intellectual awareness impaired Memory impairment (select all impairments): Short-term memory, Working civil service fast streamer, Conservation officer, historic buildings Attention impairment (select first level of impairment): Divided attention, Sustained attention Executive functioning impairment (select all impairments): Sequencing, Reasoning, Problem solving, Organization                   Following commands: Impaired Following commands impaired: Follows one step commands with increased time      Cueing   Cueing Techniques: Verbal cues, Tactile cues, Gestural cues, Visual cues             Pertinent Vitals/ Pain       Pain Assessment Pain Assessment: 0-10 Pain Score: 6  Pain Location: R knee Pain Intervention(s): Limited activity within patient's tolerance, Monitored during session, Repositioned   Frequency  Min 2X/week        Progress Toward Goals  OT Goals(current goals can now be found in the care plan section)     Acute Rehab OT Goals Patient Stated Goal: to get better OT Goal Formulation: With patient Time For Goal Achievement: 03/25/24 Potential to Achieve Goals: Good  Plan         AM-PAC OT 6 Clicks Daily Activity     Outcome Measure   Help from another person eating meals?: A Little Help from another person taking care of personal grooming?: A Little Help from another person toileting, which includes using toliet, bedpan, or urinal?: A Lot Help from another person bathing (including washing, rinsing, drying)?: A Lot Help from another person to put on and taking off regular upper body clothing?: A Little Help from another person to put on and taking off regular lower body clothing?: A Lot 6 Click Score: 15    End of Session Equipment Utilized During Treatment: Rolling walker (2 wheels);Gait belt  OT Visit Diagnosis: Other  abnormalities of gait and mobility (R26.89);Unsteadiness on feet (R26.81);Muscle weakness (generalized) (M62.81);History of falling (Z91.81);Other symptoms and signs involving cognitive function   Activity Tolerance Patient tolerated treatment well   Patient Left in chair;with call bell/phone within reach;with chair alarm set   Nurse Communication Patient requests pain meds;Mobility status;Other (comment) (The need to empty bedside commode)        Time: 8964-8944 OT Time Calculation (min): 20 min  Charges: OT General Charges $OT Visit: 1 Visit OT Treatments $Self Care/Home Management : 8-22 mins     [name], OTR/L 03/21/2024, 1:20 PM

## 2024-03-22 DIAGNOSIS — G9340 Encephalopathy, unspecified: Secondary | ICD-10-CM | POA: Diagnosis not present

## 2024-03-22 LAB — GLUCOSE, CAPILLARY
Glucose-Capillary: 138 mg/dL — ABNORMAL HIGH (ref 70–99)
Glucose-Capillary: 138 mg/dL — ABNORMAL HIGH (ref 70–99)
Glucose-Capillary: 147 mg/dL — ABNORMAL HIGH (ref 70–99)
Glucose-Capillary: 145 mg/dL — ABNORMAL HIGH (ref 70–99)

## 2024-03-22 LAB — COMPREHENSIVE METABOLIC PANEL WITH GFR
ALT: 34 U/L (ref 0–44)
AST: 24 U/L (ref 15–41)
Albumin: 3.8 g/dL (ref 3.5–5.0)
Alkaline Phosphatase: 69 U/L (ref 38–126)
Anion gap: 11 (ref 5–15)
BUN: 15 mg/dL (ref 6–20)
CO2: 25 mmol/L (ref 22–32)
Calcium: 9.4 mg/dL (ref 8.9–10.3)
Chloride: 101 mmol/L (ref 98–111)
Creatinine, Ser: 0.84 mg/dL (ref 0.61–1.24)
GFR, Estimated: >60 mL/min (ref >60)
Glucose, Bld: 112 mg/dL — ABNORMAL HIGH (ref 70–99)
Potassium: 3.6 mmol/L (ref 3.5–5.1)
Sodium: 137 mmol/L (ref 135–145)
Total Bilirubin: 0.6 mg/dL (ref 0.0–1.2)
Total Protein: 6.6 g/dL (ref 6.5–8.1)

## 2024-03-22 LAB — CBC
HCT: 48.5 % (ref 39.0–52.0)
Hemoglobin: 16.4 g/dL (ref 13.0–17.0)
MCH: 28.7 pg (ref 26.0–34.0)
MCHC: 33.8 g/dL (ref 30.0–36.0)
MCV: 84.9 fL (ref 80.0–100.0)
Platelets: 203 K/uL (ref 150–400)
RBC: 5.71 MIL/uL (ref 4.22–5.81)
RDW: 14.2 % (ref 11.5–15.5)
WBC: 6.5 K/uL (ref 4.0–10.5)
nRBC: 0 % (ref 0.0–0.2)

## 2024-03-22 NOTE — Progress Notes (Signed)
 PROGRESS NOTE [name] [name]  FMW:[id number] DOB: [birth year] DOA: 03/02/2024 PCP: [name] Jeoffrey [name], [name]  Brief Narrative/Hospital Course: [name] [name] is a 58 y.o. male with PMH of  of bipolar type I disorder, tardive dyskinesia, PTSD, MDD, GAD, type 2 diabetes on glipizide metformin  at home, hyperlipidemia and baseline tremors initially admitted to ED with fall and chronic back pain on 10/12.  Patient has been living with a friend for the last 2 to 3 years, patient was having bizarre behavior so friend did not let him come back to the apartment. Patient was boarded in the emergency room since 10/12. 10/19, psychiatry consulted as patient was endorsing suicidal thoughts-IVC 10/22, psychiatry cleared for discharge. 10/24, patient was noted to be encephalopathic so admitted to the hospital.  Multiple investigations were negative. 11/4, MRI under sedation, transient hypotension and altered mental status-overnight in ICU. 11/5, clinically improved.  Sedation wore off.  Transferred back to MedSurg bed.  Pending safe discharge planning.  Subjective: Seen and examined Alert awake resting comfortably oriented to place and self Nursing states patient needing safety sitter 11/11 as he is impulsive Overnight afebrile BP stable blood sugar 120s ordered labs for am.  Assessment and plan  Acute metabolic encephalopathy in a patient with underlying psychiatric disorder: likely from sedation, overmedication or behavior changes and has resolved.He had required multiple medications, sedation for MRI resulting in encephalopathy. W/u- CT head and subsequent MRI of the brain no acute findings. TSH 2.6, vitamin B12 573.  EEG x 3 negative for seizure. Continue PT OT At times impulsive and placed on TeleSitter 11/11  Hypoxemia: secondary to hypoventilation and sedation: Improved.  On room air.   Hypotension 2/2 sedation History of hypertension: Improved.  Home lisinopril  on hold   Bipolar 1  disorder MDD GAD: Seen by psychiatry currently stable, meds adjusted Continue Depakote  750 M G BIDy, Cogentin  1 mg bid, Atarax  50 mg qhs Ingrezza  80mg  daily, Cymbalta , PRN trazodone/ PRN Haldol but has not needed since 11/4. Patient in need for physical therapy rehab, awaiting safe disposition and team has decided to discharge to extended stay with [facility] PT OT and RN social worker.   Type 2 diabetes: Noncompliant to glipizide and metformin .  CBG stable on carb modified diet.resume metformin  on dc.  He is noncompliant to cbg at times Recent Labs  Lab 03/20/24 0744 03/20/24 1139 03/20/24 1646 03/20/24 1952 03/21/24 0736  GLUCAP 132* 148* 154* 159* 127*      Oral ulcers: has a localized ulcer on the lower lip.  Continue Augmentin x 5 days local lidocaine  jelly   Hyperlipidemia: On a statin.   Vitamin D  deficiency: On vitamin D  supple supplement.   Mobility: PT Orders: Active PT Follow up Rec: Skilled Nursing-Short Term Rehab (<3 Hours/Day)03/16/2024 1500   DVT prophylaxis: enoxaparin  (LOVENOX ) injection 40 mg Start: 03/08/24 0800 Code Status:   Code Status: Full Code Family Communication: plan of care discussed with patient at bedside. Patient status is: Remains hospitalized because of severity of illness Level of care: Med-Surg   Dispo: Anticipated disposition: awaiting placement to SNF, TOC following and APS is being involved  Objective: Vitals last 24 hrs: Vitals:   03/20/24 0546 03/20/24 1256 03/20/24 1951 03/21/24 0642  BP: 126/81 (!) 124/93 (!) 138/93 127/88  Pulse: 72 87 88 72  Resp: 17 17 20    Temp: 97.9 F (36.6 C) 98.2 F (36.8 C) (!) 97.5 F (36.4 C) 97.9 F (36.6 C)  TempSrc: Oral   Oral  SpO2:  91%  100%  Weight:      Height:        Physical Examination: General exam: Alert awake oriented to self/place HEENT:Oral mucosa moist, Ear/Nose WNL grossly Respiratory system: Bilaterally clear BS,no use of accessory muscle Cardiovascular system: S1 & S2 +, No  JVD. Gastrointestinal system: Abdomen soft,NT,ND, BS+ Nervous System: Alert, awake, moving all extremities,and following commands. Extremities: extremities warm, leg edema neg Skin: Warm, no rashes MSK: Normal muscle bulk,tone, power    Data Reviewed: I have personally reviewed following labs and imaging studies ( see epic result tab) CBC: Recent Labs  Lab 03/22/24 0436  WBC 6.5  HGB 16.4  HCT 48.5  MCV 84.9  PLT 203    CMP: Recent Labs  Lab 03/22/24 0436  NA 137  K 3.6  CL 101  CO2 25  GLUCOSE 112*  BUN 15  CREATININE 0.84  CALCIUM  9.4    GFR: Estimated Creatinine Clearance: 97.8 mL/min (by C-G formula based on SCr of 0.84 mg/dL). Recent Labs  Lab 03/22/24 0436  AST 24  ALT 34  ALKPHOS 69  BILITOT 0.6  PROT 6.6  ALBUMIN 3.8   No results for input(s): LIPASE, AMYLASE in the last 168 hours. No results for input(s): AMMONIA in the last 168 hours. Coagulation Profile: No results for input(s): INR, PROTIME in the last 168 hours. Unresulted Labs (From admission, onward)    None      Antimicrobials/Microbiology: Anti-infectives (From admission, onward)    Start     Dose/Rate Route Frequency Ordered Stop   03/12/24 1515  amoxicillin-clavulanate (AUGMENTIN) 875-125 MG per tablet 1 tablet        1 tablet Oral Every 12 hours 03/12/24 1420 03/17/24 0959   03/12/24 1515  doxycycline  (VIBRA -TABS) tablet 100 mg  Status:  Discontinued        100 mg Oral Every 12 hours 03/12/24 1420 03/15/24 1100      No results found for: SDES, SPECREQUEST, CULT, REPTSTATUS  Procedures: Procedure(s) (LRB): MRI WITH ANESTHESIA (N/A)   [name] DELENA HALEY, MD [facility] 03/22/2024, 6:31 PM

## 2024-03-22 NOTE — TOC Progression Note (Addendum)
 Transition of Care [facility]) - Progression Note    Patient Details  Name: [name] [name] MRN: [id number] Date of Birth: Dec 17, 1965  Transition of Care [facility] CM/SW Contact  Doneta Glenys DASEN, RN Phone Number: 03/22/2024, 11:20 AM  Clinical Narrative:    CM received a call from [facility] case manager Kate Minion asking about discharge plans. During discussion realized that patient has not been evaluated for Medical Decision Making Capacity. CM will place psych consult to evaluate medical decision making capacity.  3:17 PM DSS came to evaluate patient. Waiting on decision.    Barriers to Discharge: SNF Pending bed offer               Expected Discharge Plan and Services                                               Social Drivers of Health (SDOH) Interventions SDOH Screenings   Food Insecurity: Patient Unable To Answer (03/07/2024)  Housing: Unknown (03/07/2024)  Transportation Needs: Patient Unable To Answer (03/07/2024)  Utilities: Patient Unable To Answer (03/07/2024)  Depression (PHQ2-9): High Risk (01/26/2024)  Social Connections: Unknown (09/22/2021)   Received from [facility]  Tobacco Use: Low Risk  (03/13/2024)    Readmission Risk Interventions     No data to display

## 2024-03-22 NOTE — Progress Notes (Signed)
 Occupational Therapy Treatment Patient Details Name: EITHEN CASTIGLIA MRN: [id number] DOB: [birth year] Today's Date: 03/22/2024   History of present illness Pt is a 58 yr old male presenting on 10/24 for fall and chronic back pain. Change in mental status due to acute delirium, and admitted from ED with acute metabolic encephalopathy.  PMH includes: bipolar disorder type 1, tardive dyskinesia, PTSD, MDD, CAD, DM2, OA, tremors.   OT comments  The pt was seen for functional strengthening and progression of functional activity. He was assisted into standing, then to the bedside chair using a RW. He required constant assist for balance in standing, given unsteadiness and intermittent posterior bias. He then performed upper body grooming seated in the chair. He denied having pain. Continue OT plan of care. Patient will benefit from continued inpatient follow up therapy, <3 hours/day.      If plan is discharge home, recommend the following:  A lot of help with walking and/or transfers;A lot of help with bathing/dressing/bathroom;Assistance with cooking/housework;Direct supervision/assist for financial management;Direct supervision/assist for medications management;Assist for transportation;Supervision due to cognitive status   Equipment Recommendations  BSC/3in1    Recommendations for Other Services      Precautions / Restrictions Precautions Precautions: Fall Restrictions Weight Bearing Restrictions Per Provider Order: No       Mobility Bed Mobility Overal bed mobility: Needs Assistance Bed Mobility: Supine to Sit     Supine to sit: Min assist, Used rails, HOB elevated     General bed mobility comments: required slightly increased time and effort, as well as general cues for sequencing, including advancing BLE and pushing up into sitting    Transfers Overall transfer level: Needs assistance Equipment used: Rolling walker (2 wheels) Transfers: Sit to/from Stand, Bed to  chair/wheelchair/BSC Sit to Stand: Min assist, From elevated surface Stand pivot transfers: Mod assist         General transfer comment: required cues for walker placement and advancement, as well as assist for balance, given intermittent posterior bias     Balance     Sitting balance-Leahy Scale: Fair       Standing balance-Leahy Scale: Poor           ADL either performed or assessed with clinical judgement   ADL Overall ADL's : Needs assistance/impaired Eating/Feeding: Set up;Sitting Eating/Feeding Details (indicate cue type and reason): Pt drank from a cup and ate a cracker seated in the chair. Grooming: Set up;Supervision/safety;Sitting Grooming Details (indicate cue type and reason): He performed face washing and hand washing seated in the bedside chair.                 Cognition Arousal: Alert Behavior During Therapy: Flat affect Cognition: Cognition impaired   Orientation impairments: Time, Situation Awareness: Intellectual awareness impaired Memory impairment (select all impairments): Short-term memory, Working civil service fast streamer, Conservation officer, historic buildings Attention impairment (select first level of impairment): Divided attention, Sustained attention Executive functioning impairment (select all impairments): Sequencing, Reasoning, Problem solving, Organization        Following commands: Impaired Following commands impaired: Follows one step commands with increased time      Cueing   Cueing Techniques: Verbal cues, Tactile cues, Gestural cues  Exercises              Pertinent Vitals/ Pain       Pain Assessment Pain Assessment: No/denies pain   Frequency  Min 2X/week        Progress Toward Goals  OT Goals(current goals can now be found in the  care plan section)     Acute Rehab OT Goals OT Goal Formulation: With patient Time For Goal Achievement: 04/08/24 Potential to Achieve Goals: Good  Plan         AM-PAC OT 6 Clicks Daily  Activity     Outcome Measure   Help from another person eating meals?: A Little Help from another person taking care of personal grooming?: A Little Help from another person toileting, which includes using toliet, bedpan, or urinal?: A Lot Help from another person bathing (including washing, rinsing, drying)?: A Lot Help from another person to put on and taking off regular upper body clothing?: A Little Help from another person to put on and taking off regular lower body clothing?: A Lot 6 Click Score: 15    End of Session Equipment Utilized During Treatment: Gait belt;Rolling walker (2 wheels)  OT Visit Diagnosis: Other abnormalities of gait and mobility (R26.89);Unsteadiness on feet (R26.81);Muscle weakness (generalized) (M62.81);History of falling (Z91.81);Other symptoms and signs involving cognitive function   Activity Tolerance Patient tolerated treatment well   Patient Left in chair;with call bell/phone within reach;with chair alarm set   Nurse Communication Mobility status        Time: 1013-1030 OT Time Calculation (min): 17 min  Charges: OT General Charges $OT Visit: 1 Visit OT Treatments $Therapeutic Activity: 8-22 mins     [name], OTR/L 03/22/2024, 12:32 PM

## 2024-03-22 NOTE — Plan of Care (Signed)
  Problem: Clinical Measurements: Goal: Respiratory complications will improve Outcome: Progressing Goal: Cardiovascular complication will be avoided Outcome: Progressing   Problem: Activity: Goal: Risk for activity intolerance will decrease Outcome: Progressing   Problem: Coping: Goal: Level of anxiety will decrease Outcome: Progressing   Problem: Pain Managment: Goal: General experience of comfort will improve and/or be controlled Outcome: Progressing   Problem: Safety: Goal: Ability to remain free from injury will improve Outcome: Progressing   Problem: Skin Integrity: Goal: Risk for impaired skin integrity will decrease Outcome: Progressing   Problem: Metabolic: Goal: Ability to maintain appropriate glucose levels will improve Outcome: Progressing   Problem: Skin Integrity: Goal: Risk for impaired skin integrity will decrease Outcome: Progressing

## 2024-03-22 NOTE — Plan of Care (Signed)
  Problem: Clinical Measurements: Goal: Ability to maintain clinical measurements within normal limits will improve Outcome: Progressing Goal: Will remain free from infection Outcome: Progressing Goal: Diagnostic test results will improve Outcome: Progressing   Problem: Health Behavior/Discharge Planning: Goal: Ability to manage health-related needs will improve Outcome: Not Progressing

## 2024-03-23 ENCOUNTER — Inpatient Hospital Stay (HOSPITAL_COMMUNITY)

## 2024-03-23 DIAGNOSIS — G9340 Encephalopathy, unspecified: Secondary | ICD-10-CM | POA: Diagnosis not present

## 2024-03-23 LAB — GLUCOSE, CAPILLARY
Glucose-Capillary: 123 mg/dL — ABNORMAL HIGH (ref 70–99)
Glucose-Capillary: 166 mg/dL — ABNORMAL HIGH (ref 70–99)
Glucose-Capillary: 144 mg/dL — ABNORMAL HIGH (ref 70–99)
Glucose-Capillary: 154 mg/dL — ABNORMAL HIGH (ref 70–99)

## 2024-03-23 NOTE — Progress Notes (Signed)
     Patient Name: [name] [name]           DOB: March 03, 1966  MRN: [id number]      Admission Date: 03/02/2024  Attending Provider: Redia Margaretmary LABOR, MD  Primary Diagnosis: Acute encephalopathy   Level of care: Med-Surg   OVERNIGHT EVENT  HPI/ Events of Note [name] [name], 58 y.o. male, was admitted on 03/02/2024 for Acute encephalopathy.   Unwitnessed fall Patient reports he independently walked to the bathroom without assistance. While voiding, he felt his knees' weaken before falling.  Patient states he landed on his bottom, however also reports hitting the back of his head.  He denies back pain, headache, but endorses some left neck tenderness.  There are no visible signs of injury or deformities. Patient is currently A/O x3.  No acute focal neuro deficit noted.  Equal sensation and strength in upper and lower extremities.  PERRLA. BUE tremors (baseline).  Vitals are hemodynamically stable.  Patient was assisted back to bed without complications.  Plan: Images have been ordered-head and cervical CT Nursing staff to continue monitoring for change in acute symptoms, mobility, and pain. Bed alarm Fall precautions TeleSitter   Gerritt Galentine, DNP, ACNPC- AG [facility] [place]

## 2024-03-23 NOTE — Progress Notes (Signed)
 PROGRESS NOTE [name] [name]  FMW:[id number] DOB: 1965/08/05 DOA: 03/02/2024 PCP: [name], [name]  Brief Narrative/Hospital Course: [name] [name] is a 58 y.o. male with PMH of  of bipolar type I disorder, tardive dyskinesia, PTSD, MDD, GAD, type 2 diabetes on glipizide metformin  at home, hyperlipidemia and baseline tremors initially admitted to ED with fall and chronic back pain on 10/12.  Patient has been living with a friend for the last 2 to 3 years, patient was having bizarre behavior so friend did not let him come back to the apartment. Patient was boarded in the emergency room since 10/12. 10/19, psychiatry consulted as patient was endorsing suicidal thoughts-IVC 10/22, psychiatry cleared for discharge. 10/24, patient was noted to be encephalopathic so admitted to the hospital.  Multiple investigations were negative. 11/4, MRI under sedation, transient hypotension and altered mental status-overnight in ICU. 11/5, clinically improved.  Sedation wore off.  Transferred back to MedSurg bed.  Pending safe discharge planning.  Subjective: Patient had a fall last night, now has sitter He reports unsteadiness on both feet   Assessment and plan  Acute metabolic encephalopathy in a patient with underlying psychiatric disorder: likely from sedation, overmedication or behavior changes and has resolved.He had required multiple medications, sedation for MRI resulting in encephalopathy. W/u- CT head and subsequent MRI of the brain no acute findings. TSH 2.6, vitamin B12 573.  EEG x 3 negative for seizure. Continue PT OT At times impulsive and placed on TeleSitter 11/11  Hypoxemia: secondary to hypoventilation and sedation: Improved.  On room air.   Hypotension 2/2 sedation History of hypertension: Improved.  Home lisinopril  on hold   Bipolar 1 disorder MDD GAD: Seen by psychiatry currently stable, meds adjusted Continue Depakote  750 M G BIDy, Cogentin  1 mg bid, Atarax  50 mg qhs  Ingrezza  80mg  daily, Cymbalta , PRN trazodone/ PRN Haldol but has not needed since 11/4. Patient in need for physical therapy rehab, awaiting safe disposition and team has decided to discharge to extended stay with [facility] PT OT and RN social worker. Psych to consult to assess decision-making capacity.   Type 2 diabetes: Noncompliant to glipizide and metformin .  CBG stable on carb modified diet.resume metformin  on dc.  He is noncompliant to cbg at times Recent Labs  Lab 03/21/24 1646 03/21/24 2108 03/22/24 0757 03/22/24 1151 03/22/24 1649  GLUCAP 181* 136* 138* 138* 147*      Oral ulcers: has a localized ulcer on the lower lip.  Continue Augmentin x 5 days local lidocaine  jelly   Hyperlipidemia: On a statin.   Vitamin D  deficiency: On vitamin D  supple supplement.   Mobility: PT Orders: Active PT Follow up Rec: Skilled Nursing-Short Term Rehab (<3 Hours/Day)03/21/2024 1254   DVT prophylaxis: enoxaparin  (LOVENOX ) injection 40 mg Start: 03/08/24 0800 Code Status:   Code Status: Full Code Family Communication: plan of care discussed with patient at bedside. Patient status is: Remains hospitalized because of severity of illness Level of care: Med-Surg   Dispo: Anticipated disposition: awaiting placement to SNF, TOC following and APS is being involved  Objective: Vitals last 24 hrs: Vitals:   03/21/24 1244 03/21/24 2022 03/22/24 0422 03/22/24 1332  BP: 121/87 121/83 113/78 (!) 125/94  Pulse: 89 76 70 72  Resp: 18 20 16 16   Temp: (!) 97.5 F (36.4 C) 98.5 F (36.9 C) 97.9 F (36.6 C) (!) 96.6 F (35.9 C)  TempSrc: Oral Oral    SpO2: 94% 93% 94%   Weight:      Height:  Physical Examination: General exam: Alert awake oriented to self/place HEENT:Oral mucosa moist, Ear/Nose WNL grossly Respiratory system: Bilaterally clear BS,no use of accessory muscle Cardiovascular system: S1 & S2 +, No JVD. Gastrointestinal system: Abdomen soft,NT,ND, BS+ Nervous System: Alert,  awake, moving all extremities,and following commands. Extremities: extremities warm, leg edema neg Skin: Warm, no rashes MSK: Normal muscle bulk,tone, power    Data Reviewed: I have personally reviewed following labs and imaging studies ( see epic result tab) CBC: Recent Labs  Lab 03/22/24 0436  WBC 6.5  HGB 16.4  HCT 48.5  MCV 84.9  PLT 203    CMP: Recent Labs  Lab 03/22/24 0436  NA 137  K 3.6  CL 101  CO2 25  GLUCOSE 112*  BUN 15  CREATININE 0.84  CALCIUM  9.4    GFR: Estimated Creatinine Clearance: 97.8 mL/min (by C-G formula based on SCr of 0.84 mg/dL). Recent Labs  Lab 03/22/24 0436  AST 24  ALT 34  ALKPHOS 69  BILITOT 0.6  PROT 6.6  ALBUMIN 3.8   No results for input(s): LIPASE, AMYLASE in the last 168 hours. No results for input(s): AMMONIA in the last 168 hours. Coagulation Profile: No results for input(s): INR, PROTIME in the last 168 hours. Unresulted Labs (From admission, onward)    None      Antimicrobials/Microbiology: Anti-infectives (From admission, onward)    Start     Dose/Rate Route Frequency Ordered Stop   03/12/24 1515  amoxicillin-clavulanate (AUGMENTIN) 875-125 MG per tablet 1 tablet        1 tablet Oral Every 12 hours 03/12/24 1420 03/17/24 0959   03/12/24 1515  doxycycline  (VIBRA -TABS) tablet 100 mg  Status:  Discontinued        100 mg Oral Every 12 hours 03/12/24 1420 03/15/24 1100      No results found for: SDES, SPECREQUEST, CULT, REPTSTATUS  Procedures: Procedure(s) (LRB): MRI WITH ANESTHESIA (N/A) Narrative & Impression  EXAM: CT CERVICAL SPINE WITHOUT CONTRAST 03/23/2024 03:54:03 AM   TECHNIQUE: CT of the cervical spine was performed without the administration of intravenous contrast. Multiplanar reformatted images are provided for review. Automated exposure control, iterative reconstruction, and/or weight based adjustment of the mA/kV was utilized to reduce the radiation dose to as low  as reasonably achievable.   COMPARISON: CT of the cervical spine dated 02/19/2024.   CLINICAL HISTORY: Neck trauma, uncomplicated (NEXUS/CCR neg) (Age 55-64y); hospital fall.   FINDINGS:   CERVICAL SPINE:   BONES AND ALIGNMENT: No acute fracture or traumatic malalignment. There is no evidence of acute traumatic injury.   DEGENERATIVE CHANGES: There is chronic degenerative disc disease at C6-C7, with bilateral uncovertebral joint hypertrophy resulting in severe bilateral neural foraminal stenosis. There is also moderate central spinal canal stenosis secondary to posterior endplate ridging.   SOFT TISSUES: No prevertebral soft tissue swelling. There is moderate calcification within the carotid bulbs bilaterally.   IMPRESSION: 1. No evidence of acute traumatic injury 2. Chronic degenerative disc disease at C6-7 with bilateral uncovertebral joint hypertrophy resulting in severe bilateral neural foraminal stenosis and moderate central spinal canal stenosis secondary to posterior endplate ridging 3. Moderate bilateral carotid bulb calcifications, which may reflect atherosclerosis; correlate clinically and consider cardiovascular risk assessment if not already performed   Electronically signed by: Evalene Coho MD 03/23/2024 04:05 AM EST RP Workstation: [id number]  Narrative & Impression  EXAM: CT HEAD WITHOUT CONTRAST 03/23/2024 03:54:03 AM   TECHNIQUE: CT of the head was performed without the administration of intravenous contrast. Automated  exposure control, iterative reconstruction, and/or weight based adjustment of the mA/kV was utilized to reduce the radiation dose to as low as reasonably achievable.   COMPARISON: 03/17/2024   CLINICAL HISTORY: Head trauma, minor, normal mental status (Age 36-64y); hospital fall.   FINDINGS:   BRAIN AND VENTRICLES: Age related atrophy. No acute hemorrhage. No evidence of acute infarct. No hydrocephalus. No extra-axial  collection. No mass effect or midline shift.   ORBITS: No acute abnormality.   SINUSES: No acute abnormality.   SOFT TISSUES AND SKULL: No acute soft tissue abnormality. No skull fracture.   IMPRESSION: 1. No acute intracranial abnormality related to the head trauma. 2. Age related atrophy.   Electronically signed by: Evalene Coho MD 03/23/2024 04:01 AM EST RP Workstation: [id number]      Result History     [name] FORBES Baller, MD [facility] 03/23/2024, 9:42 AM

## 2024-03-23 NOTE — Progress Notes (Signed)
 Lavanda Horns NP rounded at bedside . Telesitter ordered as pt is a high falls risk . Further imaging ordered as pt has c/o pain from unwitnessed fall at 0230 on [address] in room. SABRA SABRA Oas Nurse Yahweh aware .

## 2024-03-23 NOTE — Progress Notes (Signed)
 Pt had a unwitnessed fall in room . Secure chat sent to Lavanda Horns NP .

## 2024-03-23 NOTE — TOC Progression Note (Signed)
 Transition of Care [facility]) - Progression Note    Patient Details  Name: [name] [name] MRN: [id number] Date of Birth: 07/28/1965  [facility] CM/SW Contact  Doneta Glenys DASEN, RN Phone Number: 03/23/2024, 8:28 AM  Clinical Narrative:    CM called DSS to get update on report. Left message.     Barriers to Discharge: SNF Pending bed offer               Expected Discharge Plan and Services                                               Social Drivers of Health (SDOH) Interventions SDOH Screenings   Food Insecurity: Patient Unable To Answer (03/07/2024)  Housing: Unknown (03/07/2024)  Transportation Needs: Patient Unable To Answer (03/07/2024)  Utilities: Patient Unable To Answer (03/07/2024)  Depression (PHQ2-9): High Risk (01/26/2024)  Social Connections: Unknown (09/22/2021)   Received from [facility]  Tobacco Use: Low Risk  (03/13/2024)    Readmission Risk Interventions     No data to display

## 2024-03-23 NOTE — Progress Notes (Signed)
 Physical Therapy Treatment Patient Details Name: [name] [name] MRN: [id number] DOB: 1965/12/31 Today's Date: 03/23/2024   History of Present Illness Pt is a 58 y/o male presenting on 10/24 for fall and chronic back pain. 10/19 psychiatry consulted due to suicidal thoughts, cleared on 10/22. Change in mental status due to acute delirium, and admitted from ED with acute metabolic encephalopathy. CT negative, EEG negative, MRI pending, chest x ray with hypoventilation. PMH includes: bipolar disorder type 1, tardive dyskinesia, PTSD, MDD, CAD, DM2, OA, tremors.    PT Comments  AxO x 2 pleasant and willing.  He stated he was Dx with Parkinson's 2 years ago.  He stated, he falls alot.  He stated he was staying with Friends but unclear on details.  He currently has a Recruitment Consultant in room.  Assisted OOB to amb to the bathroom was difficult.  General bed mobility comments: Pt required Min Assist to transition from supine to EOB using quick movement and assist with upper body.  Typical Parkinson's rigidity. General transfer comment: Requires anywhere from Max to [facility] Assist present with severe posterior lean/LOB with no self correction. VERY unsteady with tight turns with incompletion of turns prior to attempting to sit.  Present with uncontrolled stand to sit on toilet hard plop Therapist recovered and stiffness throughout.  Getting off lower toilet height required Max Assist and immediately presented with heavy, posterior lean.  Pt unable to self achieve center/midline, static balance.  Therapist Max Assist to prevent posterior LOB.  Pt has a Total LOB with back steps to recliner with near fall/Therapist Total Assist to bring Pt downward to recliner. General Gait Details: VERY unsteady gait requiring anywhere from Min to Max Assist due to his Parkinsonism movement disorder presenting with rigidity, ataxia, festination and poor self control to correct any balance instability.  HIGH FALL RISK.  Would NOT  rec he amb on his own.  Rec Total Hands on assist with all transfers and mobility.  Gait belt also indicated.  Pt c/o low back pain.  Per chart review, Pt had an unwitnessed fall last night. Also noted urine to be dark.   LPT has rec Pt will need ST Rehab at SNF to address mobility and functional decline prior to safely returning home.  Pt would also benefit from a repeat Neurology Consult to address his increased Parkinson's Movement Disorder symptoms  of rigidity, ataxia, festination and inability to achieve self balance.     If plan is discharge home, recommend the following: Assistance with cooking/housework;Assist for transportation;Help with stairs or ramp for entrance;Direct supervision/assist for medications management;A lot of help with walking and/or transfers;A lot of help with bathing/dressing/bathroom   Can travel by private vehicle     No  Equipment Recommendations       Recommendations for Other Services       Precautions / Restrictions Precautions Precautions: Fall Recall of Precautions/Restrictions: Impaired Precaution/Restrictions Comments: Parkinson's Restrictions Weight Bearing Restrictions Per Provider Order: No     Mobility  Bed Mobility Overal bed mobility: Needs Assistance Bed Mobility: Supine to Sit     Supine to sit: Min assist, Used rails, HOB elevated     General bed mobility comments: Pt required Min Assist to transition from supine to EOB using quick movement and assist with upper body.  Typical Parkinson's rigidity.    Transfers Overall transfer level: Needs assistance Equipment used: Rolling walker (2 wheels) Transfers: Sit to/from Stand Sit to Stand: Min assist, Mod assist, Max assist  General transfer comment: Requires anywhere from Max to [facility] Assist present with severe posterior lean/LOB with no self correction. VERY unsteady with tight turns with incompletion of turns prior to attempting to sit.  Present with  uncontrolled stand to sit on toilet hard plop Therapist recovered and stiffness throughout.  Getting off lower toilet height required Max Assist and immediately presented with heavy, posterior lean.  Pt unable to self achieve center/midline, static balance.  Therapist Max Assist to prevent posterior LOB.  Pt has a Total LOB with back steps to recliner with near fall/Therapist Total Assist to bring Pt downward to recliner.    Ambulation/Gait Ambulation/Gait assistance: Min assist, Mod assist, Max assist Gait Distance (Feet): 65 Feet Assistive device: Rolling walker (2 wheels) Gait Pattern/deviations: Step-through pattern, Decreased stride length, Narrow base of support Gait velocity: decreased     General Gait Details: VERY unsteady gait requiring anywhere from Min to Max Assist due to his Parkinsonism movement disorder presenting with rigidity, ataxia, festination and poor self control to correct any balance instability.  HIGH FALL RISK.  Would NOT rec he amb on his own.  Rec Total Hands on assist with all transfers and mobility.  Gait belt also indicated.   Stairs             Wheelchair Mobility     Tilt Bed    Modified Rankin (Stroke Patients Only)       Balance                                            Communication Communication Communication: Impaired  Cognition Arousal: Alert Behavior During Therapy: WFL for tasks assessed/performed   PT - Cognitive impairments: No apparent impairments                       PT - Cognition Comments: AxO x 2 pleasant and willing.  He stated he was Dx with Parkinson's 2 years ago.  He stated, he falls alot.  He stated he was staying with Friends but unclear on details.  He currently has a Recruitment Consultant in room. Following commands: Impaired Following commands impaired: Follows one step commands with increased time    Cueing Cueing Techniques: Verbal cues  Exercises      General Comments         Pertinent Vitals/Pain Pain Assessment Pain Assessment: Faces Faces Pain Scale: Hurts little more Pain Location: low back Pain Descriptors / Indicators: Discomfort, Grimacing Pain Intervention(s): Monitored during session, Repositioned    Home Living                          Prior Function            PT Goals (current goals can now be found in the care plan section) Progress towards PT goals: Progressing toward goals    Frequency    Min 2X/week      PT Plan      Co-evaluation              AM-PAC PT 6 Clicks Mobility   Outcome Measure  Help needed turning from your back to your side while in a flat bed without using bedrails?: A Lot Help needed moving from lying on your back to sitting on the side of a flat bed without using bedrails?: A Lot Help  needed moving to and from a bed to a chair (including a wheelchair)?: A Lot Help needed standing up from a chair using your arms (e.g., wheelchair or bedside chair)?: A Lot Help needed to walk in hospital room?: A Lot Help needed climbing 3-5 steps with a railing? : Total 6 Click Score: 11    End of Session Equipment Utilized During Treatment: Gait belt Activity Tolerance: Patient limited by fatigue Patient left: in chair;with call bell/phone within reach;with chair alarm set;with nursing/sitter in room radiographer, therapeutic) Nurse Communication: Mobility status PT Visit Diagnosis: History of falling (Z91.81);Unsteadiness on feet (R26.81);Other abnormalities of gait and mobility (R26.89);Muscle weakness (generalized) (M62.81);Repeated falls (R29.6);Pain     Time: 1009-1035 PT Time Calculation (min) (ACUTE ONLY): 26 min  Charges:    $Gait Training: 8-22 mins $Therapeutic Activity: 8-22 mins PT General Charges $$ ACUTE PT VISIT: 1 Visit                     Katheryn Leap  PTA Acute  Rehabilitation Services Office M-F          [phone]

## 2024-03-24 DIAGNOSIS — Z515 Encounter for palliative care: Secondary | ICD-10-CM | POA: Diagnosis not present

## 2024-03-24 DIAGNOSIS — G2401 Drug induced subacute dyskinesia: Secondary | ICD-10-CM | POA: Diagnosis not present

## 2024-03-24 DIAGNOSIS — G9340 Encephalopathy, unspecified: Secondary | ICD-10-CM | POA: Diagnosis not present

## 2024-03-24 DIAGNOSIS — Z7189 Other specified counseling: Secondary | ICD-10-CM | POA: Diagnosis not present

## 2024-03-24 LAB — GLUCOSE, CAPILLARY
Glucose-Capillary: 117 mg/dL — ABNORMAL HIGH (ref 70–99)
Glucose-Capillary: 153 mg/dL — ABNORMAL HIGH (ref 70–99)
Glucose-Capillary: 117 mg/dL — ABNORMAL HIGH (ref 70–99)
Glucose-Capillary: 147 mg/dL — ABNORMAL HIGH (ref 70–99)

## 2024-03-24 NOTE — Plan of Care (Signed)

## 2024-03-24 NOTE — Progress Notes (Signed)
 PROGRESS NOTE [name] [name]  FMW:[id number] DOB: Sep 05, 1965 DOA: 03/02/2024 PCP: [name], [name]   Brief Narrative/Hospital Course: Patient is a 58 year old male past medical history significant for type I bipolar disorder, tardive dyskinesia, PTSD, MDD, GAD, type 2 diabetes on glipizide metformin  at home, hyperlipidemia and baseline tremors.  Patient was admitted with acute encephalopathy.  Acute encephalopathy seems to have resolved significantly.  Patient is awaiting disposition.    Patient was boarded in the emergency room since 10/12. 10/19, psychiatry consulted as patient was endorsing suicidal thoughts-IVC 10/22, psychiatry cleared for discharge. 10/24, patient was noted to be encephalopathic so admitted to the hospital.  Multiple investigations were negative. 11/4, MRI under sedation, transient hypotension and altered mental status-overnight in ICU. 11/5, clinically improved.  Sedation wore off.  Transferred back to MedSurg bed.  Pending safe discharge planning.  03/24/2024: Patient seen.  Patient remains anxious.  Awaiting disposition.  Subjective: No new complaints.  Assessment and plan  Acute metabolic encephalopathy in a patient with underlying psychiatric disorder: -Likely multifactorial. - Encephalopathy has resolved significantly. - CT head and subsequent MRI of the brain - no acute findings. -TSH 2.6, vitamin B12 573.  EEG x 3 negative for seizure. Continue PT OT -Encephalopathy has resolved significantly. - Pursue disposition.  Hypoxemia: -Improved. - On room air.   Hypotension 2/2 sedation History of hypertension: Improved.  Home lisinopril  on hold   Bipolar 1 disorder MDD GAD: As per prior documentation: Seen by psychiatry currently stable, meds adjusted Continue Depakote  750 M G BIDy, Cogentin  1 mg bid, Atarax  50 mg qhs Ingrezza  80mg  daily, Cymbalta , PRN trazodone/ PRN Haldol but has not needed since 11/4. Patient in need for physical therapy  rehab, awaiting safe disposition and team has decided to discharge to extended stay with [facility] PT OT and RN social worker. Psych to consult to assess decision-making capacity.   Type 2 diabetes: Oral ulcers:    Hyperlipidemia: On a statin.   Vitamin D  deficiency: On vitamin D  supple supplement.   Mobility: PT Orders: Active PT Follow up Rec: Skilled Nursing-Short Term Rehab (<3 Hours/Day)03/21/2024 1254   DVT prophylaxis: enoxaparin  (LOVENOX ) injection 40 mg Start: 03/08/24 0800 Code Status:   Code Status: Full Code Family Communication:   Patient status is: Remains hospitalized because of severity of illness Level of care: Med-Surg   Dispo: Anticipated disposition: awaiting placement to SNF, TOC following and APS is being involved  Objective: Vitals last 24 hrs: Vitals:   03/21/24 1244 03/21/24 2022 03/22/24 0422 03/22/24 1332  BP: 121/87 121/83 113/78 (!) 125/94  Pulse: 89 76 70 72  Resp: 18 20 16 16   Temp: (!) 97.5 F (36.4 C) 98.5 F (36.9 C) 97.9 F (36.6 C) (!) 96.6 F (35.9 C)  TempSrc: Oral Oral    SpO2: 94% 93% 94%   Weight:      Height:        Physical Examination: General exam: Awake and alert.  Not in any distress.  Patient is anxious.   HEENT: No pallor. Respiratory system: Clear to auscultation. Cardiovascular system: S1 & S2. Gastrointestinal system: Abdomen is soft and nontender.   Nervous System: Awake and alert.  Moves extremities.     Data Reviewed: I have personally reviewed following labs and imaging studies ( see epic result tab) CBC: Recent Labs  Lab 03/22/24 0436  WBC 6.5  HGB 16.4  HCT 48.5  MCV 84.9  PLT 203    CMP: Recent Labs  Lab 03/22/24 0436  NA  137  K 3.6  CL 101  CO2 25  GLUCOSE 112*  BUN 15  CREATININE 0.84  CALCIUM  9.4    GFR: Estimated Creatinine Clearance: 97.8 mL/min (by C-G formula based on SCr of 0.84 mg/dL). Recent Labs  Lab 03/22/24 0436  AST 24  ALT 34  ALKPHOS 69  BILITOT 0.6  PROT 6.6   ALBUMIN 3.8   No results for input(s): LIPASE, AMYLASE in the last 168 hours. No results for input(s): AMMONIA in the last 168 hours. Coagulation Profile: No results for input(s): INR, PROTIME in the last 168 hours. Unresulted Labs (From admission, onward)    None      Antimicrobials/Microbiology: Anti-infectives (From admission, onward)    Start     Dose/Rate Route Frequency Ordered Stop   03/12/24 1515  amoxicillin-clavulanate (AUGMENTIN) 875-125 MG per tablet 1 tablet        1 tablet Oral Every 12 hours 03/12/24 1420 03/17/24 0959   03/12/24 1515  doxycycline  (VIBRA -TABS) tablet 100 mg  Status:  Discontinued        100 mg Oral Every 12 hours 03/12/24 1420 03/15/24 1100      No results found for: SDES, SPECREQUEST, CULT, REPTSTATUS  Procedures: Procedure(s) (LRB): MRI WITH ANESTHESIA (N/A) Narrative & Impression  EXAM: CT CERVICAL SPINE WITHOUT CONTRAST 03/23/2024 03:54:03 AM   TECHNIQUE: CT of the cervical spine was performed without the administration of intravenous contrast. Multiplanar reformatted images are provided for review. Automated exposure control, iterative reconstruction, and/or weight based adjustment of the mA/kV was utilized to reduce the radiation dose to as low as reasonably achievable.   COMPARISON: CT of the cervical spine dated 02/19/2024.   CLINICAL HISTORY: Neck trauma, uncomplicated (NEXUS/CCR neg) (Age 56-64y); hospital fall.   FINDINGS:   CERVICAL SPINE:   BONES AND ALIGNMENT: No acute fracture or traumatic malalignment. There is no evidence of acute traumatic injury.   DEGENERATIVE CHANGES: There is chronic degenerative disc disease at C6-C7, with bilateral uncovertebral joint hypertrophy resulting in severe bilateral neural foraminal stenosis. There is also moderate central spinal canal stenosis secondary to posterior endplate ridging.   SOFT TISSUES: No prevertebral soft tissue swelling. There is moderate  calcification within the carotid bulbs bilaterally.   IMPRESSION: 1. No evidence of acute traumatic injury 2. Chronic degenerative disc disease at C6-7 with bilateral uncovertebral joint hypertrophy resulting in severe bilateral neural foraminal stenosis and moderate central spinal canal stenosis secondary to posterior endplate ridging 3. Moderate bilateral carotid bulb calcifications, which may reflect atherosclerosis; correlate clinically and consider cardiovascular risk assessment if not already performed   Electronically signed by: Evalene Coho MD 03/23/2024 04:05 AM EST RP Workstation: [id number]  Narrative & Impression  EXAM: CT HEAD WITHOUT CONTRAST 03/23/2024 03:54:03 AM   TECHNIQUE: CT of the head was performed without the administration of intravenous contrast. Automated exposure control, iterative reconstruction, and/or weight based adjustment of the mA/kV was utilized to reduce the radiation dose to as low as reasonably achievable.   COMPARISON: 03/17/2024   CLINICAL HISTORY: Head trauma, minor, normal mental status (Age 64-64y); hospital fall.   FINDINGS:   BRAIN AND VENTRICLES: Age related atrophy. No acute hemorrhage. No evidence of acute infarct. No hydrocephalus. No extra-axial collection. No mass effect or midline shift.   ORBITS: No acute abnormality.   SINUSES: No acute abnormality.   SOFT TISSUES AND SKULL: No acute soft tissue abnormality. No skull fracture.   IMPRESSION: 1. No acute intracranial abnormality related to the head trauma. 2. Age related  atrophy.   Electronically signed by: Evalene Coho MD 03/23/2024 04:01 AM EST RP Workstation: [id number]      Result History    Time spent: 35 minutes.  Leatrice LILLETTE Chapel, MD [facility] 03/24/2024, 6:16 PM

## 2024-03-25 DIAGNOSIS — M545 Low back pain, unspecified: Secondary | ICD-10-CM | POA: Diagnosis not present

## 2024-03-25 DIAGNOSIS — R410 Disorientation, unspecified: Secondary | ICD-10-CM | POA: Diagnosis not present

## 2024-03-25 DIAGNOSIS — G9340 Encephalopathy, unspecified: Secondary | ICD-10-CM | POA: Diagnosis not present

## 2024-03-25 DIAGNOSIS — R531 Weakness: Secondary | ICD-10-CM | POA: Diagnosis not present

## 2024-03-25 LAB — GLUCOSE, CAPILLARY
Glucose-Capillary: 120 mg/dL — ABNORMAL HIGH (ref 70–99)
Glucose-Capillary: 142 mg/dL — ABNORMAL HIGH (ref 70–99)

## 2024-03-25 MED ORDER — METFORMIN HCL 500 MG PO TABS
500.0000 mg | ORAL_TABLET | Freq: Two times a day (BID) | ORAL | Status: DC
  Administered 2024-03-25 – 2024-05-20 (×112): 500 mg via ORAL
  Filled 2024-03-25 (×100): qty 1

## 2024-03-25 MED ORDER — LACTATED RINGERS IV BOLUS
1000.0000 mL | Freq: Once | INTRAVENOUS | Status: AC
  Administered 2024-03-25: 500 mL via INTRAVENOUS

## 2024-03-25 NOTE — Plan of Care (Signed)
   Problem: Clinical Measurements: Goal: Will remain free from infection Outcome: Progressing Goal: Respiratory complications will improve Outcome: Progressing

## 2024-03-25 NOTE — Plan of Care (Signed)
   Problem: Activity: Goal: Risk for activity intolerance will decrease Outcome: Progressing   Problem: Nutrition: Goal: Adequate nutrition will be maintained Outcome: Progressing   Problem: Coping: Goal: Level of anxiety will decrease Outcome: Progressing

## 2024-03-25 NOTE — Progress Notes (Signed)
 PROGRESS NOTE  [name] [name] FMW:[id number] DOB: 1965-08-29   PCP: [name], [name]  Patient is from: Home.  Lives with friend, [name].  No immediate family members.  Only 1 estranged brother that does not want to talk to him.  DOA: 03/02/2024 LOS: 18  Chief complaints Chief Complaint  Patient presents with   Placement     Brief Narrative / Interim history: 58 year old M with PMH of type I bipolar disorder, tardive dyskinesia, PTSD, MDD, GAD, DM-2, tremor, frequent falls and HLD brought to ED by EMS on 10/18 due to talking crazy.   Per EDP note, roommate/friend reported problems with the patient having frequent falls in the house, difficulty caring for himself, erratic behavior including today where he had a very loud outburst in front of her, calling her names, and then was overheard stating he may kill himself.  She is concerned because he has access to 2 guns in the house.     Patient was boarded in the emergency room since 10/18 10/19, psychiatry consulted as patient was endorsing suicidal thoughts-IVC 10/22, psychiatry cleared for discharge. 10/24, patient was noted to be encephalopathic so admitted to the hospital.  11/4, MRI under sedation, transient hypotension and altered mental status-overnight in ICU. 11/5, clinically improved.  Sedation wore off.  Transferred back to MedSurg bed.    Pending safe discharge planning.   Subjective: Seen and examined earlier this morning.  No major events overnight or this morning.  Not a great historian.  He states shoot me with a gun.  He lies to get out of the bed to urinate although he has a purwick attached.  Not engaging.  Does not follow commands either.   Assessment and plan: Acute metabolic encephalopathy in a patient with underlying psychiatric disorder: Multifactorial including positive underlying cognitive impairment, dehydration, delirium and iatrogenic.  CT head and MRI brain without acute finding.  TSH, B12, EEG  x 3 unrevealing.  CBC on 10/24 with signs of hemoconcentration.  Low suspicion for infection. -Reorientation and delirium precaution -Ensure hydration.  Will give bolus LR 1 L at 250 cc an hour today -Check basic labs in the morning -Fall precaution and TeleSitter.  Bipolar 1 disorder/MDD/GAD/PTSD: Followed by psychiatry who has adjusted his home medications. -Continue Depakote  750 M G BIDy, Cogentin  1 mg bid, Atarax  50 mg qhs, Ingrezza  80mg  daily, Cymbalta ,  -PRN trazodone/ PRN Haldol but has not needed since 11/4.    NIDDM-2: A1c 6.3%.  CBG within acceptable range. Recent Labs  Lab 03/24/24 1055 03/24/24 1654 03/24/24 2119 03/25/24 0715 03/25/24 1211  GLUCAP 153* 117* 147* 120* 142*  - Start low-dose metformin  -Discontinue CBG monitoring  Physical deconditioning/recurrent falls  -Therapy recommended SNF.   Hypoxemia: Resolved.   Hypotension 2/2 sedation: Resolved. -Continue holding lisinopril .  Upper lipidemia - Continue statin.   Vitamin D  deficiency: On vitamin D  supple supplement.  Dehydration: dry and chapped lips - IV fluid bolus.  Body mass index is 27.65 kg/m.          DVT prophylaxis:  enoxaparin  (LOVENOX ) injection 40 mg Start: 03/08/24 0800  Code Status: Full code Family Communication: None at bedside Level of care: Med-Surg Status is: Inpatient Remains inpatient appropriate because: SNF bed   Final disposition: SNF   55 minutes with more than 50% spent in reviewing records, counseling patient/family and coordinating care.  Consultants:  Psychiatry  Procedures: None  Microbiology summarized: MRSA PCR screen positive.  Objective: Vitals:   03/24/24 0823 03/24/24 1348 03/24/24  1928 03/25/24 0632  BP: (!) 117/90 121/81 (!) 114/93 (!) 145/82  Pulse: 77 82 75 72  Resp: 16 20 18 19   Temp: 98.2 F (36.8 C) 98.2 F (36.8 C) 97.7 F (36.5 C) 97.6 F (36.4 C)  TempSrc: Oral   Axillary  SpO2: 95% 100% 93% 96%  Weight:      Height:         Examination:  GENERAL: No apparent distress.  Nontoxic. HEENT: Chapped and dry lips.  Vision and hearing grossly intact.  NECK: Supple.  No apparent JVD.  RESP:  No IWOB.  Fair aeration bilaterally. CVS:  RRR. Heart sounds normal.  ABD/GI/GU: BS+. Abd soft, NTND.  MSK/EXT:  Moves extremities.  Difficult muscle mass and subcu fat loss. SKIN: no apparent skin lesion or wound NEURO: Awake.  Not engaging to assess orientation.  Does not follow commands.  No apparent focal neuro deficit. PSYCH: Calm. Normal affect.   Sch Meds:  Scheduled Meds:  atorvastatin   20 mg Oral Daily   benztropine   1 mg Oral BID   Chlorhexidine Gluconate Cloth  6 each Topical Daily   divalproex   750 mg Oral BID   DULoxetine   40 mg Oral BID   enoxaparin  (LOVENOX ) injection  40 mg Subcutaneous Q24H   hydrOXYzine   50 mg Oral QHS   metFORMIN   500 mg Oral BID WC   mupirocin ointment  1 Application Nasal BID   valbenazine   80 mg Oral Daily   Vitamin D  (Ergocalciferol )  50,000 Units Oral Q7 days   Continuous Infusions: PRN Meds:.acetaminophen  **OR** acetaminophen , artificial tears, benzocaine, bisacodyl, haloperidol **OR** haloperidol lactate, lip balm, ondansetron **OR** ondansetron (ZOFRAN) IV, senna-docusate, traZODone  Antimicrobials: Anti-infectives (From admission, onward)    Start     Dose/Rate Route Frequency Ordered Stop   03/12/24 1515  amoxicillin-clavulanate (AUGMENTIN) 875-125 MG per tablet 1 tablet        1 tablet Oral Every 12 hours 03/12/24 1420 03/17/24 0959   03/12/24 1515  doxycycline  (VIBRA -TABS) tablet 100 mg  Status:  Discontinued        100 mg Oral Every 12 hours 03/12/24 1420 03/15/24 1100        I have personally reviewed the following labs and images: CBC: Recent Labs  Lab 03/22/24 0436  WBC 6.5  HGB 16.4  HCT 48.5  MCV 84.9  PLT 203   BMP &GFR Recent Labs  Lab 03/22/24 0436  NA 137  K 3.6  CL 101  CO2 25  GLUCOSE 112*  BUN 15  CREATININE 0.84  CALCIUM  9.4    Estimated Creatinine Clearance: 97.8 mL/min (by C-G formula based on SCr of 0.84 mg/dL). Liver & Pancreas: Recent Labs  Lab 03/22/24 0436  AST 24  ALT 34  ALKPHOS 69  BILITOT 0.6  PROT 6.6  ALBUMIN 3.8   No results for input(s): LIPASE, AMYLASE in the last 168 hours. No results for input(s): AMMONIA in the last 168 hours. Diabetic: No results for input(s): HGBA1C in the last 72 hours. Recent Labs  Lab 03/24/24 1055 03/24/24 1654 03/24/24 2119 03/25/24 0715 03/25/24 1211  GLUCAP 153* 117* 147* 120* 142*   Cardiac Enzymes: No results for input(s): CKTOTAL, CKMB, CKMBINDEX, TROPONINI in the last 168 hours. No results for input(s): PROBNP in the last 8760 hours. Coagulation Profile: No results for input(s): INR, PROTIME in the last 168 hours. Thyroid  Function Tests: No results for input(s): TSH, T4TOTAL, FREET4, T3FREE, THYROIDAB in the last 72 hours. Lipid Profile: No results  for input(s): CHOL, HDL, LDLCALC, TRIG, CHOLHDL, LDLDIRECT in the last 72 hours. Anemia Panel: No results for input(s): VITAMINB12, FOLATE, FERRITIN, TIBC, IRON, RETICCTPCT in the last 72 hours. Urine analysis:    Component Value Date/Time   COLORURINE YELLOW 03/07/2024 2022   APPEARANCEUR CLEAR 03/07/2024 2022   LABSPEC 1.024 03/07/2024 2022   PHURINE 6.0 03/07/2024 2022   GLUCOSEU NEGATIVE 03/07/2024 2022   HGBUR NEGATIVE 03/07/2024 2022   BILIRUBINUR NEGATIVE 03/07/2024 2022   KETONESUR 5 (A) 03/07/2024 2022   PROTEINUR NEGATIVE 03/07/2024 2022   UROBILINOGEN 1.0 10/24/2008 1155   NITRITE NEGATIVE 03/07/2024 2022   LEUKOCYTESUR TRACE (A) 03/07/2024 2022   Sepsis Labs: Invalid input(s): PROCALCITONIN, LACTICIDVEN  Microbiology: Recent Results (from the past 240 hours)  MRSA Next Gen by PCR, Nasal     Status: Abnormal   Collection Time: 03/16/24  2:20 PM   Specimen: Nasal Mucosa; Nasal Swab  Result Value Ref Range Status    MRSA by PCR Next Gen DETECTED (A) NOT DETECTED Final    Comment: (NOTE) The GeneXpert MRSA Assay (FDA approved for NASAL specimens only), is one component of a comprehensive MRSA colonization surveillance program. It is not intended to diagnose MRSA infection nor to guide or monitor treatment for MRSA infections. Test performance is not FDA approved in patients less than 2 years old. Performed at [facility], [address] [place], [place] [address]     Radiology Studies: No results found.    Ettamae Barkett T. Senan Urey [facility]  If 7PM-7AM, please contact night-coverage [email or web address] 03/25/2024, 1:13 PM

## 2024-03-26 DIAGNOSIS — G9340 Encephalopathy, unspecified: Secondary | ICD-10-CM | POA: Diagnosis not present

## 2024-03-26 DIAGNOSIS — R531 Weakness: Secondary | ICD-10-CM | POA: Diagnosis not present

## 2024-03-26 DIAGNOSIS — R410 Disorientation, unspecified: Secondary | ICD-10-CM | POA: Diagnosis not present

## 2024-03-26 DIAGNOSIS — M545 Low back pain, unspecified: Secondary | ICD-10-CM | POA: Diagnosis not present

## 2024-03-26 LAB — COMPREHENSIVE METABOLIC PANEL WITH GFR
ALT: 29 U/L (ref 0–44)
AST: 24 U/L (ref 15–41)
Albumin: 4 g/dL (ref 3.5–5.0)
Alkaline Phosphatase: 78 U/L (ref 38–126)
Anion gap: 12 (ref 5–15)
BUN: 13 mg/dL (ref 6–20)
CO2: 26 mmol/L (ref 22–32)
Calcium: 9.7 mg/dL (ref 8.9–10.3)
Chloride: 100 mmol/L (ref 98–111)
Creatinine, Ser: 0.83 mg/dL (ref 0.61–1.24)
GFR, Estimated: >60 mL/min (ref >60)
Glucose, Bld: 104 mg/dL — ABNORMAL HIGH (ref 70–99)
Potassium: 3.8 mmol/L (ref 3.5–5.1)
Sodium: 137 mmol/L (ref 135–145)
Total Bilirubin: 0.8 mg/dL (ref 0.0–1.2)
Total Protein: 7.2 g/dL (ref 6.5–8.1)

## 2024-03-26 LAB — CBC
HCT: 51.7 % (ref 39.0–52.0)
Hemoglobin: 17.3 g/dL — ABNORMAL HIGH (ref 13.0–17.0)
MCH: 28.8 pg (ref 26.0–34.0)
MCHC: 33.5 g/dL (ref 30.0–36.0)
MCV: 86.2 fL (ref 80.0–100.0)
Platelets: 176 K/uL (ref 150–400)
RBC: 6 MIL/uL — ABNORMAL HIGH (ref 4.22–5.81)
RDW: 14 % (ref 11.5–15.5)
WBC: 7.9 K/uL (ref 4.0–10.5)
nRBC: 0 % (ref 0.0–0.2)

## 2024-03-26 LAB — PHOSPHORUS: Phosphorus: 3.3 mg/dL (ref 2.5–4.6)

## 2024-03-26 LAB — MAGNESIUM: Magnesium: 1.9 mg/dL (ref 1.7–2.4)

## 2024-03-26 MED ORDER — ENSURE PLUS HIGH PROTEIN PO LIQD
237.0000 mL | Freq: Two times a day (BID) | ORAL | Status: DC
  Administered 2024-03-27 – 2024-04-13 (×6): 237 mL via ORAL

## 2024-03-26 MED ORDER — PROPRANOLOL HCL 10 MG PO TABS
10.0000 mg | ORAL_TABLET | Freq: Three times a day (TID) | ORAL | Status: DC
  Administered 2024-03-26 – 2024-03-28 (×6): 10 mg via ORAL
  Filled 2024-03-26 (×6): qty 1

## 2024-03-26 MED ORDER — ADULT MULTIVITAMIN W/MINERALS CH
1.0000 | ORAL_TABLET | Freq: Every day | ORAL | Status: DC
  Administered 2024-03-26 – 2024-06-08 (×75): 1 via ORAL
  Filled 2024-03-26 (×68): qty 1

## 2024-03-26 MED ORDER — AMLODIPINE BESYLATE 5 MG PO TABS
5.0000 mg | ORAL_TABLET | Freq: Every day | ORAL | Status: DC
  Administered 2024-03-26 – 2024-04-13 (×18): 5 mg via ORAL
  Filled 2024-03-26 (×20): qty 1

## 2024-03-26 MED ORDER — LACTATED RINGERS IV BOLUS
500.0000 mL | Freq: Two times a day (BID) | INTRAVENOUS | Status: DC
  Administered 2024-03-26 – 2024-03-29 (×7): 500 mL via INTRAVENOUS

## 2024-03-26 NOTE — Progress Notes (Signed)
 Initial Nutrition Assessment  DOCUMENTATION CODES:  Not applicable  INTERVENTION:  Multivitamin w/ minerals daily Recommend obtaining new weight Ensure Plus High Protein po BID, each supplement provides 350 kcal and 20 grams of protein  NUTRITION DIAGNOSIS:  Increased nutrient needs related to acute illness as evidenced by estimated needs.  GOAL:  Patient will meet greater than or equal to 90% of their needs  MONITOR:  Supplement acceptance, PO intake, Labs, Weight trends  REASON FOR ASSESSMENT:  Consult Assessment of nutrition requirement/status  ASSESSMENT:  58 y.o. male presented to the ED with altered mental status. PMH includes bipolar disorder, PTSD, T2DM, MDD, GERD, HTN, HLD, and osteoarthritis. Pt admitted with acute encephalopathy.   10/24 - Admitted 11/04 - transferred to ICU for mentation 11/05 - transferred back to floor   RD working remotely at time of assessment. Pt disoriented x4 per chart and noted to be a poor historian. Ongoing discharge planning occurring to determine safe disposition. Discussed with RN that pt ate well for breakfast and then only had a few bites for lunch. Discussed starting oral nutrition supplements to assist when PO intake is poor, RN thinks that pt will drink them. Will continue to monitor pt PO intake and adjust as needed.   Meal Intake 11/15: 100% breakfast, 50% lunch, 100% dinner 11/16: 80% breakfast, 70% lunch, 40% dinner 11/17: 50% breakfast, 15% lunch  Per chart review, pt noted with weight loss over the past year. Pt currently has been at the hospital for >20 days and has not had a new weight. RD will order new weight to correctly assess patients weight loss.   Admission/Current Weight: 87.4 kg  Nutrition Related Medications: Metformin , Vitamin D  (weekly) Labs: Sodium 137, Potassium 3.8, BUN 13, Creatinine 0.83, Phosphorus 3.3, Magnesium  1.9  UOP: 1300 mL x 2 unmeasured occurrences x 24 hrs  NUTRITION - FOCUSED PHYSICAL  EXAM: Deferred to follow-up.  Diet Order:   Diet Order             Diet Carb Modified Fluid consistency: Thin; Room service appropriate? Yes  Diet effective now                  EDUCATION NEEDS:  Not appropriate for education at this time  Skin:  Skin Assessment: Reviewed RN Assessment  Last BM:  11/17 - Type 6  Height:  Ht Readings from Last 1 Encounters:  03/07/24 5' 10 (1.778 m)   Weight:  Wt Readings from Last 1 Encounters:  03/07/24 87.4 kg   Ideal Body Weight:  75.5 kg  BMI:  Body mass index is 27.65 kg/m.  Estimated Nutritional Needs:  Kcal:  2100-2300 Protein:  100-120 grams Fluid:  >/= 2 L   Nestora Glatter RD, LDN Registered Dietitian I Please see AMION for contact information

## 2024-03-26 NOTE — Plan of Care (Signed)

## 2024-03-26 NOTE — TOC Progression Note (Addendum)
 Transition of Care [facility]) - Progression Note    Patient Details  Name: [name] [name] MRN: [id number] Date of Birth: [birth year]  Transition of [facility]) CM/SW Contact  Doneta Glenys DASEN, RN Phone Number: 03/26/2024, 9:44 AM  Clinical Narrative:    CM called DSS spoke with Ryan Cardinal. Mr Cardinal gave me Terris Gosling Social Worker [phone] left message for update on DSS report. 10:35 AM DSS returned call and will be requesting patient's medical records to assist her in making a decision. CM messaged Dr. Kathrin requesting a evaluation for  Medical Decision Making Capacity . 2:06 PM No SNF bed offers. Patient is currently in 4 point restraints. CM will continue to follow for medical readiness for discharge.   Barriers to Discharge: SNF Pending bed offer               Expected Discharge Plan and Services                                               Social Drivers of Health (SDOH) Interventions SDOH Screenings   Food Insecurity: Patient Unable To Answer (03/07/2024)  Housing: Unknown (03/07/2024)  Transportation Needs: Patient Unable To Answer (03/07/2024)  Utilities: Patient Unable To Answer (03/07/2024)  Depression (PHQ2-9): High Risk (01/26/2024)  Social Connections: Unknown (09/22/2021)   Received from [facility]  Tobacco Use: Low Risk  (03/13/2024)    Readmission Risk Interventions     No data to display

## 2024-03-26 NOTE — Plan of Care (Signed)

## 2024-03-26 NOTE — Progress Notes (Signed)
 PROGRESS NOTE  [name] [name] FMW:[id number] DOB: 20-Feb-1966   PCP: [name], [name]  Patient is from: Home.  Lives with friend, [name].  No immediate family members.  Only one estranged brother that does not want to talk to him.  DOA: 03/02/2024 LOS: 19  Chief complaints Chief Complaint  Patient presents with   Placement     Brief Narrative / Interim history: 58 year old M with PMH of type I bipolar disorder, tardive dyskinesia, PTSD, MDD, GAD, DM-2, tremor, frequent falls and HLD brought to ED by EMS on 10/18 due to talking crazy.   Per EDP note, roommate/friend reported problems with the patient having frequent falls in the house, difficulty caring for himself, erratic behavior including today where he had a very loud outburst in front of her, calling her names, and then was overheard stating he may kill himself.  She is concerned because he has access to 2 guns in the house.     Patient was boarded in the emergency room since 10/18 10/19, psychiatry consulted as patient was endorsing suicidal thoughts-IVC 10/22, psychiatry cleared for discharge. 10/24, patient was noted to be encephalopathic so admitted to the hospital.  11/4, MRI under sedation, transient hypotension and altered mental status-overnight in ICU. 11/5, clinically improved.  Sedation wore off.  Transferred back to MedSurg bed.    Pending safe discharge planning.  Patient has no immediate family members.  Has not demonstrated capacity to make his own medical decision.  APS involved.   Subjective: Seen and examined earlier this morning.  No major events overnight or this morning.  Has bilateral wrist and ankle soft restraints.  He was kicking and threatening staff earlier this morning.  No complaints but not a great historian.  He denies pain.  Awake and oriented to self and place but not time.   Assessment and plan: Acute metabolic encephalopathy in a patient with underlying psychiatric disorder:  Multifactorial including positive underlying cognitive impairment, dehydration, delirium and iatrogenic.  CT head and MRI brain without acute finding.  TSH, B12, EEG x 3 unrevealing.  CBC on 10/24 with signs of hemoconcentration.  Low suspicion for infection. Kicking and threatening staff.  -Reorientation and delirium precaution -Will give intermittent bolus IV fluid to ensure hydration. -Fall precaution, TeleSitter and soft restraints  Bipolar 1 disorder/MDD/GAD/PTSD: psychiatry adjusted meds. Still with confusion and agitation at times.  He was kicking and threatening staff.  Patient lives with friend, [name].  No immediate family members.  Only one estranged brother that does not want to talk to him.  Has not demonstrated capacity to make his own medical decision so far for me.  -Continue Depakote  750 mg bid, Cogentin  1 mg bid, Atarax  50 mg qhs, Ingrezza  80mg  daily, Cymbalta  40 mg bid  -Continue trazodone and Haldol as needed.   NIDDM-2: A1c 6.3%.  CBG within acceptable range. Recent Labs  Lab 03/24/24 1055 03/24/24 1654 03/24/24 2119 03/25/24 0715 03/25/24 1211  GLUCAP 153* 117* 147* 120* 142*  - Start low-dose metformin  -Discontinue CBG monitoring  Physical deconditioning/recurrent falls  -Therapy recommended SNF.   Hypoxemia: Resolved.   Hypotension 2/2 sedation: Resolved. Essential hypertension: BP slightly elevated. -Start low-dose amlodipine -Resume home Inderal  -Continue holding lisinopril  due to risk for AKI.  Upper lipidemia - Continue statin.   Vitamin D  deficiency: -Continue vitamin D  supplementation  Dehydration: dry and chapped lips - IV fluid bolus intermittently.  Goal of care: Poor long-term prognosis due to underlying complex mental health and very limited social  support. -Palliative medicine consulted.  Body mass index is 27.65 kg/m.          DVT prophylaxis:  enoxaparin  (LOVENOX ) injection 40 mg Start: 03/08/24 0800  Code Status: Full  code Family Communication: None at bedside Level of care: Med-Surg Status is: Inpatient Remains inpatient appropriate because: SNF bed   Final disposition: SNF   55 minutes with more than 50% spent in reviewing records, counseling patient/family and coordinating care.  Consultants:  Psychiatry Palliative medicine  Procedures: None  Microbiology summarized: MRSA PCR screen positive.  Objective: Vitals:   03/25/24 0632 03/25/24 2114 03/26/24 0513 03/26/24 1418  BP: (!) 145/82 (!) 130/104 (!) 131/116 (!) 158/88  Pulse: 72 87 77 84  Resp: 19 16 18 20   Temp: 97.6 F (36.4 C) 98.7 F (37.1 C) 98 F (36.7 C) (!) 97.5 F (36.4 C)  TempSrc: Axillary Oral Oral Oral  SpO2: 96% 93% 94% 96%  Weight:      Height:        Examination:  GENERAL: No apparent distress.  Nontoxic. HEENT: Chapped and dry lips.  Vision and hearing grossly intact.  NECK: Supple.  No apparent JVD.  RESP:  No IWOB.  Fair aeration bilaterally. CVS:  RRR. Heart sounds normal.  ABD/GI/GU: BS+. Abd soft, NTND.  MSK/EXT:  Moves extremities.  Difficult muscle mass and subcu fat loss. SKIN: no apparent skin lesion or wound NEURO: Awake.  Oriented to self and place but not time.  Poor insight.  No apparent focal neuro deficit. PSYCH: Restless.  Fighting with right wrist restraints  Sch Meds:  Scheduled Meds:  amLODipine  5 mg Oral Daily   atorvastatin   20 mg Oral Daily   benztropine   1 mg Oral BID   divalproex   750 mg Oral BID   DULoxetine   40 mg Oral BID   enoxaparin  (LOVENOX ) injection  40 mg Subcutaneous Q24H   hydrOXYzine   50 mg Oral QHS   metFORMIN   500 mg Oral BID WC   propranolol   10 mg Oral TID   valbenazine   80 mg Oral Daily   Vitamin D  (Ergocalciferol )  50,000 Units Oral Q7 days   Continuous Infusions:  lactated ringers  Stopped (03/26/24 1231)   PRN Meds:.acetaminophen  **OR** acetaminophen , artificial tears, benzocaine, bisacodyl, haloperidol **OR** haloperidol lactate, lip balm,  ondansetron **OR** ondansetron (ZOFRAN) IV, senna-docusate, traZODone  Antimicrobials: Anti-infectives (From admission, onward)    Start     Dose/Rate Route Frequency Ordered Stop   03/12/24 1515  amoxicillin-clavulanate (AUGMENTIN) 875-125 MG per tablet 1 tablet        1 tablet Oral Every 12 hours 03/12/24 1420 03/17/24 0959   03/12/24 1515  doxycycline  (VIBRA -TABS) tablet 100 mg  Status:  Discontinued        100 mg Oral Every 12 hours 03/12/24 1420 03/15/24 1100        I have personally reviewed the following labs and images: CBC: Recent Labs  Lab 03/22/24 0436 03/26/24 0436  WBC 6.5 7.9  HGB 16.4 17.3*  HCT 48.5 51.7  MCV 84.9 86.2  PLT 203 176   BMP &GFR Recent Labs  Lab 03/22/24 0436 03/26/24 0436  NA 137 137  K 3.6 3.8  CL 101 100  CO2 25 26  GLUCOSE 112* 104*  BUN 15 13  CREATININE 0.84 0.83  CALCIUM  9.4 9.7  MG  --  1.9  PHOS  --  3.3   Estimated Creatinine Clearance: 98.9 mL/min (by C-G formula based on SCr of 0.83 mg/dL). Liver &  Pancreas: Recent Labs  Lab 03/22/24 0436 03/26/24 0436  AST 24 24  ALT 34 29  ALKPHOS 69 78  BILITOT 0.6 0.8  PROT 6.6 7.2  ALBUMIN 3.8 4.0   No results for input(s): LIPASE, AMYLASE in the last 168 hours. No results for input(s): AMMONIA in the last 168 hours. Diabetic: No results for input(s): HGBA1C in the last 72 hours. Recent Labs  Lab 03/24/24 1055 03/24/24 1654 03/24/24 2119 03/25/24 0715 03/25/24 1211  GLUCAP 153* 117* 147* 120* 142*   Cardiac Enzymes: No results for input(s): CKTOTAL, CKMB, CKMBINDEX, TROPONINI in the last 168 hours. No results for input(s): PROBNP in the last 8760 hours. Coagulation Profile: No results for input(s): INR, PROTIME in the last 168 hours. Thyroid  Function Tests: No results for input(s): TSH, T4TOTAL, FREET4, T3FREE, THYROIDAB in the last 72 hours. Lipid Profile: No results for input(s): CHOL, HDL, LDLCALC, TRIG, CHOLHDL,  LDLDIRECT in the last 72 hours. Anemia Panel: No results for input(s): VITAMINB12, FOLATE, FERRITIN, TIBC, IRON, RETICCTPCT in the last 72 hours. Urine analysis:    Component Value Date/Time   COLORURINE YELLOW 03/07/2024 2022   APPEARANCEUR CLEAR 03/07/2024 2022   LABSPEC 1.024 03/07/2024 2022   PHURINE 6.0 03/07/2024 2022   GLUCOSEU NEGATIVE 03/07/2024 2022   HGBUR NEGATIVE 03/07/2024 2022   BILIRUBINUR NEGATIVE 03/07/2024 2022   KETONESUR 5 (A) 03/07/2024 2022   PROTEINUR NEGATIVE 03/07/2024 2022   UROBILINOGEN 1.0 10/24/2008 1155   NITRITE NEGATIVE 03/07/2024 2022   LEUKOCYTESUR TRACE (A) 03/07/2024 2022   Sepsis Labs: Invalid input(s): PROCALCITONIN, LACTICIDVEN  Microbiology: No results found for this or any previous visit (from the past 240 hours).   Radiology Studies: No results found.    Pleasant Bensinger T. Larra Crunkleton [facility]  If 7PM-7AM, please contact night-coverage [email or web address] 03/26/2024, 3:31 PM

## 2024-03-27 DIAGNOSIS — F329 Major depressive disorder, single episode, unspecified: Secondary | ICD-10-CM

## 2024-03-27 DIAGNOSIS — Z7189 Other specified counseling: Secondary | ICD-10-CM | POA: Insufficient documentation

## 2024-03-27 DIAGNOSIS — R410 Disorientation, unspecified: Secondary | ICD-10-CM | POA: Diagnosis not present

## 2024-03-27 DIAGNOSIS — M545 Low back pain, unspecified: Secondary | ICD-10-CM | POA: Diagnosis not present

## 2024-03-27 DIAGNOSIS — G9340 Encephalopathy, unspecified: Secondary | ICD-10-CM | POA: Diagnosis not present

## 2024-03-27 DIAGNOSIS — R531 Weakness: Secondary | ICD-10-CM | POA: Diagnosis not present

## 2024-03-27 DIAGNOSIS — G2401 Drug induced subacute dyskinesia: Secondary | ICD-10-CM | POA: Diagnosis not present

## 2024-03-27 DIAGNOSIS — Z515 Encounter for palliative care: Secondary | ICD-10-CM | POA: Diagnosis not present

## 2024-03-27 NOTE — Progress Notes (Signed)
 PROGRESS NOTE  [name] [name] FMW:[id number] DOB: [birth year]   PCP: [name], [name]  Patient is from: Home.  Lives with friend, [name].  No immediate family members.  Only one estranged brother that does not want to talk to him.  DOA: 03/02/2024 LOS: 20  Chief complaints Chief Complaint  Patient presents with   Placement     Brief Narrative / Interim history: 58 year old M with PMH of type I bipolar disorder, tardive dyskinesia, PTSD, MDD, GAD, DM-2, tremor, frequent falls and HLD brought to ED by EMS on 10/18 due to talking crazy.   Per EDP note, roommate/friend reported problems with the patient having frequent falls in the house, difficulty caring for himself, erratic behavior including today where he had a very loud outburst in front of her, calling her names, and then was overheard stating he may kill himself.  She is concerned because he has access to 2 guns in the house.     Patient was boarded in the emergency room since 10/18 10/19, psychiatry consulted as patient was endorsing suicidal thoughts-IVC 10/22, psychiatry cleared for discharge. 10/24, patient was noted to be encephalopathic so admitted to the hospital.  11/4, MRI under sedation, transient hypotension and altered mental status-overnight in ICU. 11/5, clinically improved.  Sedation wore off.  Transferred back to MedSurg bed.    Pending safe discharge planning.   Has not demonstrated capacity to make his own medical decision. Patient has no immediate family members.  APS involved.  Two-physician DNR on 03/27/2024 after discussion with palliative medicine given patient's multiple medical comorbidity    Subjective: Seen and examined earlier this morning.  No major events overnight or this morning.  Patient is awake and alert but only oriented to self and place.  Per RN, attempted to kick her earlier. He is aggressive and verbally threatening.    Assessment and plan: Acute metabolic encephalopathy in a  patient with underlying psychiatric disorder: Multifactorial including positive underlying cognitive impairment, dehydration, delirium and iatrogenic.  CT head and MRI brain without acute finding.  TSH, B12, EEG x 3 unrevealing.  CBC on 10/24 with signs of hemoconcentration.  Low suspicion for infection. Kicking and threatening staff.  -Reorientation and delirium precaution -Will give intermittent bolus IV fluid to ensure hydration. -Palliative reengaging psychiatry -Fall precaution, TeleSitter and soft restraints  Bipolar 1 disorder/MDD/GAD/PTSD: psychiatry adjusted meds. Still with confusion and agitation at times.  He was kicking and threatening staff.  Patient lives with friend, [name].  No immediate family members.  Only one estranged brother that does not want to talk to him.  Has not demonstrated capacity to make his own medical decision so far.  -Continue Depakote  750 mg bid, Cogentin  1 mg bid, Atarax  50 mg qhs, Ingrezza  80mg  daily, Cymbalta  40 mg bid  -Continue trazodone and Haldol as needed. -Palliative reengaging psychiatry.   NIDDM-2: A1c 6.3%.  CBG within acceptable range.  CBG monitoring discontinued. -Continue low-dose metformin .  Physical deconditioning/recurrent falls  -Therapy recommended SNF.   Hypoxemia: Resolved.  On room air.   Hypotension 2/2 sedation: Resolved. Essential hypertension: BP slightly elevated. -Start low-dose amlodipine -Resume home Inderal  -Continue holding lisinopril  due to risk for AKI.  Upper lipidemia - Continue statin.   Vitamin D  deficiency: -Continue vitamin D  supplementation  Dehydration: dry and chapped lips - IV fluid bolus intermittently.  Goal of care: Poor long-term prognosis due to underlying complex mental health and very limited social support.  No immediate family members. Two person DNR after discussion  with palliative medicine on 11/18. -Palliative medicine following.  Increased nutrient needs Body mass index is 26.26  kg/m. Nutrition Problem: Increased nutrient needs Etiology: acute illness Signs/Symptoms: estimated needs Interventions: Ensure Enlive (each supplement provides 350kcal and 20 grams of protein), MVI   DVT prophylaxis:  enoxaparin  (LOVENOX ) injection 40 mg Start: 03/08/24 0800  Code Status: DNR. Family Communication: None at bedside Level of care: Med-Surg Status is: Inpatient Remains inpatient appropriate because: SNF bed   Final disposition: SNF   55 minutes with more than 50% spent in reviewing records, counseling patient/family and coordinating care.  Consultants:  Psychiatry Palliative medicine  Procedures: None  Microbiology summarized: MRSA PCR screen positive.  Objective: Vitals:   03/26/24 1715 03/26/24 1949 03/27/24 0620 03/27/24 1036  BP: (!) 159/94 (!) 131/94 115/85 132/83  Pulse: 88 65 65 (!) 59  Resp:  18 17 16   Temp:  97.7 F (36.5 C) 98.2 F (36.8 C) 97.7 F (36.5 C)  TempSrc:  Oral Axillary Oral  SpO2:  100% 94% 97%  Weight: 83 kg     Height:        Examination:  GENERAL: No apparent distress.  Nontoxic. HEENT: Chapped and dry lips.  Vision and hearing grossly intact.  NECK: Supple.  No apparent JVD.  RESP:  No IWOB.  Fair aeration bilaterally. CVS:  RRR. Heart sounds normal.  ABD/GI/GU: BS+. Abd soft, NTND.  MSK/EXT:  Moves extremities.  Difficult muscle mass and subcu fat loss. SKIN: no apparent skin lesion or wound NEURO: Awake.  Oriented to self and place but not time.  Poor insight.  No apparent focal neuro deficit. PSYCH: Restless.  Fighting with right wrist restraints  Sch Meds:  Scheduled Meds:  amLODipine  5 mg Oral Daily   atorvastatin   20 mg Oral Daily   benztropine   1 mg Oral BID   divalproex   750 mg Oral BID   DULoxetine   40 mg Oral BID   enoxaparin  (LOVENOX ) injection  40 mg Subcutaneous Q24H   feeding supplement  237 mL Oral BID BM   hydrOXYzine   50 mg Oral QHS   metFORMIN   500 mg Oral BID WC   multivitamin with  minerals  1 tablet Oral Daily   propranolol   10 mg Oral TID   valbenazine   80 mg Oral Daily   Vitamin D  (Ergocalciferol )  50,000 Units Oral Q7 days   Continuous Infusions:  lactated ringers  500 mL (03/27/24 1025)   PRN Meds:.acetaminophen  **OR** acetaminophen , artificial tears, benzocaine, bisacodyl, haloperidol **OR** haloperidol lactate, lip balm, ondansetron **OR** ondansetron (ZOFRAN) IV, senna-docusate, traZODone  Antimicrobials: Anti-infectives (From admission, onward)    Start     Dose/Rate Route Frequency Ordered Stop   03/12/24 1515  amoxicillin-clavulanate (AUGMENTIN) 875-125 MG per tablet 1 tablet        1 tablet Oral Every 12 hours 03/12/24 1420 03/17/24 0959   03/12/24 1515  doxycycline  (VIBRA -TABS) tablet 100 mg  Status:  Discontinued        100 mg Oral Every 12 hours 03/12/24 1420 03/15/24 1100        I have personally reviewed the following labs and images: CBC: Recent Labs  Lab 03/22/24 0436 03/26/24 0436  WBC 6.5 7.9  HGB 16.4 17.3*  HCT 48.5 51.7  MCV 84.9 86.2  PLT 203 176   BMP &GFR Recent Labs  Lab 03/22/24 0436 03/26/24 0436  NA 137 137  K 3.6 3.8  CL 101 100  CO2 25 26  GLUCOSE 112* 104*  BUN 15 13  CREATININE 0.84 0.83  CALCIUM  9.4 9.7  MG  --  1.9  PHOS  --  3.3   Estimated Creatinine Clearance: 98.9 mL/min (by C-G formula based on SCr of 0.83 mg/dL). Liver & Pancreas: Recent Labs  Lab 03/22/24 0436 03/26/24 0436  AST 24 24  ALT 34 29  ALKPHOS 69 78  BILITOT 0.6 0.8  PROT 6.6 7.2  ALBUMIN 3.8 4.0   No results for input(s): LIPASE, AMYLASE in the last 168 hours. No results for input(s): AMMONIA in the last 168 hours. Diabetic: No results for input(s): HGBA1C in the last 72 hours. Recent Labs  Lab 03/24/24 1055 03/24/24 1654 03/24/24 2119 03/25/24 0715 03/25/24 1211  GLUCAP 153* 117* 147* 120* 142*   Cardiac Enzymes: No results for input(s): CKTOTAL, CKMB, CKMBINDEX, TROPONINI in the last 168  hours. No results for input(s): PROBNP in the last 8760 hours. Coagulation Profile: No results for input(s): INR, PROTIME in the last 168 hours. Thyroid  Function Tests: No results for input(s): TSH, T4TOTAL, FREET4, T3FREE, THYROIDAB in the last 72 hours. Lipid Profile: No results for input(s): CHOL, HDL, LDLCALC, TRIG, CHOLHDL, LDLDIRECT in the last 72 hours. Anemia Panel: No results for input(s): VITAMINB12, FOLATE, FERRITIN, TIBC, IRON, RETICCTPCT in the last 72 hours. Urine analysis:    Component Value Date/Time   COLORURINE YELLOW 03/07/2024 2022   APPEARANCEUR CLEAR 03/07/2024 2022   LABSPEC 1.024 03/07/2024 2022   PHURINE 6.0 03/07/2024 2022   GLUCOSEU NEGATIVE 03/07/2024 2022   HGBUR NEGATIVE 03/07/2024 2022   BILIRUBINUR NEGATIVE 03/07/2024 2022   KETONESUR 5 (A) 03/07/2024 2022   PROTEINUR NEGATIVE 03/07/2024 2022   UROBILINOGEN 1.0 10/24/2008 1155   NITRITE NEGATIVE 03/07/2024 2022   LEUKOCYTESUR TRACE (A) 03/07/2024 2022   Sepsis Labs: Invalid input(s): PROCALCITONIN, LACTICIDVEN  Microbiology: No results found for this or any previous visit (from the past 240 hours).   Radiology Studies: No results found.    Kerry Odonohue T. Cordon Gassett [facility]  If 7PM-7AM, please contact night-coverage [email or web address] 03/27/2024, 1:59 PM

## 2024-03-27 NOTE — Progress Notes (Signed)
 PT Cancellation Note  Patient Details Name: [name] [name] MRN: [id number] DOB: [birth year]   Cancelled Treatment:    Reason Eval/Treat Not Completed: Other (comment). Pt adamantly declined PT today. Pt stated that he is afraid of falling and that his back hurts. Pt ed provided on healthy fear of falling and a debilitating fear as well as importance of mobilization to maintain and improve functional strength to reduce risk of falls. Pt declined following education. PT made nurse aware of pt reported back pain and fear of falling with decline to participate. PT to continue to follow acutely.   [place], PT Acute Rehab   [place] VEAR Drone 03/27/2024, 12:49 PM

## 2024-03-27 NOTE — Plan of Care (Signed)
  Problem: Clinical Measurements: Goal: Ability to maintain clinical measurements within normal limits will improve Outcome: Progressing Goal: Will remain free from infection Outcome: Progressing Goal: Diagnostic test results will improve Outcome: Progressing Goal: Respiratory complications will improve Outcome: Progressing Goal: Cardiovascular complication will be avoided Outcome: Progressing   Problem: Activity: Goal: Risk for activity intolerance will decrease Outcome: Progressing   Problem: Nutrition: Goal: Adequate nutrition will be maintained Outcome: Progressing   Problem: Coping: Goal: Level of anxiety will decrease Outcome: Progressing   Problem: Elimination: Goal: Will not experience complications related to bowel motility Outcome: Progressing Goal: Will not experience complications related to urinary retention Outcome: Progressing   Problem: Pain Managment: Goal: General experience of comfort will improve and/or be controlled Outcome: Progressing   Problem: Safety: Goal: Ability to remain free from injury will improve Outcome: Progressing   Problem: Skin Integrity: Goal: Risk for impaired skin integrity will decrease Outcome: Progressing   Problem: Education: Goal: Ability to describe self-care measures that may prevent or decrease complications (Diabetes Survival Skills Education) will improve Outcome: Progressing Goal: Individualized Educational Video(s) Outcome: Progressing   Problem: Coping: Goal: Ability to adjust to condition or change in health will improve Outcome: Progressing   Problem: Fluid Volume: Goal: Ability to maintain a balanced intake and output will improve Outcome: Progressing   Problem: Health Behavior/Discharge Planning: Goal: Ability to identify and utilize available resources and services will improve Outcome: Progressing Goal: Ability to manage health-related needs will improve Outcome: Progressing   Problem:  Metabolic: Goal: Ability to maintain appropriate glucose levels will improve Outcome: Progressing   Problem: Nutritional: Goal: Maintenance of adequate nutrition will improve Outcome: Progressing Goal: Progress toward achieving an optimal weight will improve Outcome: Progressing   Problem: Skin Integrity: Goal: Risk for impaired skin integrity will decrease Outcome: Progressing   Problem: Tissue Perfusion: Goal: Adequacy of tissue perfusion will improve Outcome: Progressing

## 2024-03-27 NOTE — Plan of Care (Signed)
   Problem: Health Behavior/Discharge Planning: Goal: Ability to manage health-related needs will improve Outcome: Progressing   Problem: Clinical Measurements: Goal: Diagnostic test results will improve Outcome: Progressing

## 2024-03-27 NOTE — Consult Note (Signed)
 Consultation Note Date: 03/27/2024   Patient Name: [name] [name]  DOB: 04/21/1966  MRN: [id number]  Age / Sex: 58 y.o., male   PCP: [name], [name] Referring Physician: Kathrin Mignon DASEN, MD  Reason for Consultation: Establishing goals of care     Chief Complaint/History of Present Illness:   Patient is a 58 year old male with a past medical history of type I bipolar disorder, tardive dyskinesia, PTSD, MDD, GAD, tremor, frequent falls, and hyperlipidemia who was brought to the ED on 02/25/2023 by EMS for talking crazy.  Patient's roommate/friend reported patient was having frequent falls, erratic behavior, and difficulty caring for himself at home; roommate had also overheard patient stating that he may kill himself which was concerning since he has access to 2 guns in the house.  Initially patient was endorsing suicidal thoughts and IVC was obtained by psychiatry though psychiatry cleared for discharge on 02/29/2024.  Patient then noted to be encephalopathic and so was admitted to the hospital on 03/02/2024.  Hospitalization further complicated by hypotension requiring ICU admission though has currently resolved.  TOC involved to seek guardianship for patient as no family or friends willing to make decisions on patient's behalf.  Palliative medicine team consulted to assist with complex medical decision making.  Extensive review of EMR prior to presenting to bedside including documentation by [facility], neurologist, hospitalist, and psychiatry.  Neurology note on 03/13/2024 noting findings consistent with abulia which may be due to parkinsonian is him in conjunction with his psychiatric comorbidities.  Neurology noted patient may benefit from outpatient dementia evaluation.  Psychiatry note on 03/14/2024 noted patient denied any SI/HI/AVH or paranoid thoughts and they signed off at that time.  TOC continues conversations with DSS about guardianship and placement. Review of patient's most recent CMP  noted GFR estimated over 60.  Head CT from 03/23/2024 personally reviewed noting no acute intracranial abnormality related to head trauma and age-related atrophy.  Discussed care with hospitalist for medical updates.  Presented to bedside to see patient.  Discussed care with bedside RN for medical updates as well.  RN noted that patient has been combative this morning to her and other staff.  Patient is not able to participate in complex medical decision making as oriented to self and place currently though not situation.  Staff at bedside trying to calm patient down.  Discussed care with hospitalist after visit.  Reached out to psychiatry as well regarding patient's psychiatric diagnoses and concerns related to medication management of them. Hospitalist and this provider discussed that based on patient's multiple medical comorbidities, concerned that interventions such as cardiac and respiratory resuscitation would not lead to good quality of life outcomes.  Agree with two-physician DNR.  Primary Diagnoses  Present on Admission:  Acute encephalopathy  Chronic low back pain without sciatica  Acute delirium  Bipolar 1 disorder (HCC)   Palliative Review of Systems: Confused and combative   Past Medical History:  Diagnosis Date   Anxiety    Arthritis    Chest heaviness    Chills    Depression, major    Diabetes mellitus without complication (HCC)    Dizzy    Dyslipidemia    ED (erectile dysfunction)    Elevated CPK    GERD (gastroesophageal reflux disease)    HA (headache)    Hypertension    Hypertriglyceridemia    Insomnia    Kidney stone    Left-sided Bell's palsy    Morbid obesity (HCC)    Nausea  No appetite    Panniculitis    of right lower leg   Parkinson's disease (HCC)    PTSD (post-traumatic stress disorder)    Routine screening for STI (sexually transmitted infection) 01/05/2023   Weakness    Social History   Socioeconomic History   Marital status: Single     Spouse name: Not on file   Number of children: 0   Years of education: 12   Highest education level: Not on file  Occupational History   Not on file  Tobacco Use   Smoking status: Never   Smokeless tobacco: Never  Vaping Use   Vaping status: Never Used  Substance and Sexual Activity   Alcohol use: No   Drug use: Never   Sexual activity: Yes  Other Topics Concern   Not on file  Social History Narrative   Lives with housemate in a one story home.  Has a girlfriend. No children.  Is not working presently (was working [employer]).  Education: high school.    Social Drivers of Corporate Investment Banker Strain: Not on file  Food Insecurity: Patient Unable To Answer (03/07/2024)   Hunger Vital Sign    Worried About Running Out of Food in the Last Year: Patient unable to answer    Ran Out of Food in the Last Year: Patient unable to answer  Transportation Needs: Patient Unable To Answer (03/07/2024)   PRAPARE - Transportation    Lack of Transportation (Medical): Patient unable to answer    Lack of Transportation (Non-Medical): Patient unable to answer  Physical Activity: Not on file  Stress: Not on file  Social Connections: Unknown (09/22/2021)   Received from [employer]   Social Network    Social Network: Not on file   Family History  Problem Relation Age of Onset   Thyroid  disease Brother    Depression Brother    Heart disease Mother    Anxiety disorder Mother    Kidney failure Father    Diabetes Father    Anxiety disorder Father    Thyroid  disease Sister    Mental illness Sister    Heart attack Maternal Grandfather    Heart failure Paternal Grandmother    Cancer Paternal Grandfather        bone   Scheduled Meds:  amLODipine  5 mg Oral Daily   atorvastatin   20 mg Oral Daily   benztropine   1 mg Oral BID   divalproex   750 mg Oral BID   DULoxetine   40 mg Oral BID   enoxaparin  (LOVENOX ) injection  40 mg Subcutaneous Q24H   feeding supplement  237  mL Oral BID BM   hydrOXYzine   50 mg Oral QHS   metFORMIN   500 mg Oral BID WC   multivitamin with minerals  1 tablet Oral Daily   propranolol   10 mg Oral TID   valbenazine   80 mg Oral Daily   Vitamin D  (Ergocalciferol )  50,000 Units Oral Q7 days   Continuous Infusions:  lactated ringers  500 mL (03/26/24 2133)   PRN Meds:.acetaminophen  **OR** acetaminophen , artificial tears, benzocaine, bisacodyl, haloperidol **OR** haloperidol lactate, lip balm, ondansetron **OR** ondansetron (ZOFRAN) IV, senna-docusate, traZODone No Known Allergies CBC:    Component Value Date/Time   WBC 7.9 03/26/2024 0436   HGB 17.3 (H) 03/26/2024 0436   HGB 17.3 10/10/2023 1200   HCT 51.7 03/26/2024 0436   HCT 52.2 (H) 10/10/2023 1200   PLT 176 03/26/2024 0436   PLT 227 10/10/2023 1200  MCV 86.2 03/26/2024 0436   MCV 84 10/10/2023 1200   NEUTROABS 4.2 03/07/2024 1535   NEUTROABS 4.6 10/10/2023 1200   LYMPHSABS 2.7 03/07/2024 1535   LYMPHSABS 4.2 (H) 10/10/2023 1200   MONOABS 0.8 03/07/2024 1535   EOSABS 0.1 03/07/2024 1535   EOSABS 0.2 10/10/2023 1200   BASOSABS 0.1 03/07/2024 1535   BASOSABS 0.1 10/10/2023 1200   Comprehensive Metabolic Panel:    Component Value Date/Time   NA 137 03/26/2024 0436   NA 141 09/01/2022 1408   K 3.8 03/26/2024 0436   CL 100 03/26/2024 0436   CO2 26 03/26/2024 0436   BUN 13 03/26/2024 0436   BUN 13 09/01/2022 1408   CREATININE 0.83 03/26/2024 0436   CREATININE 1.03 12/08/2023 1435   GLUCOSE 104 (H) 03/26/2024 0436   CALCIUM  9.7 03/26/2024 0436   AST 24 03/26/2024 0436   ALT 29 03/26/2024 0436   ALKPHOS 78 03/26/2024 0436   BILITOT 0.8 03/26/2024 0436   BILITOT 0.6 10/10/2023 1200   PROT 7.2 03/26/2024 0436   PROT 7.0 10/10/2023 1200   ALBUMIN 4.0 03/26/2024 0436   ALBUMIN 4.4 10/10/2023 1200    Physical Exam: Vital Signs: BP 115/85 (BP Location: Left Arm)   Pulse 65   Temp 98.2 F (36.8 C) (Axillary)   Resp 17   Ht 5' 10 (1.778 m)   Wt 83 kg   SpO2  94%   BMI 26.26 kg/m  SpO2: SpO2: 94 % O2 Device: O2 Device: Room Air O2 Flow Rate: O2 Flow Rate (L/min): 2 L/min Intake/output summary:  Intake/Output Summary (Last 24 hours) at 03/27/2024 9350 Last data filed at 03/27/2024 0500 Gross per 24 hour  Intake 862.99 ml  Output 400 ml  Net 462.99 ml   LBM: Last BM Date : 03/26/24 Baseline Weight: Weight: 87.4 kg Most recent weight: Weight: 83 kg  General: Awake, oriented to self Cardiovascular: RRR Respiratory: no increased work of breathing noted, not in respiratory distress Abdomen: not distended Neuro: Awake, oriented to self Psych: Agitated          Palliative Performance Scale: 40%.              Additional Data Reviewed: Recent Labs    03/26/24 0436  WBC 7.9  HGB 17.3*  PLT 176  NA 137  BUN 13  CREATININE 0.83    Imaging: CT CERVICAL SPINE WO CONTRAST EXAM: CT CERVICAL SPINE WITHOUT CONTRAST 03/23/2024 03:54:03 AM  TECHNIQUE: CT of the cervical spine was performed without the administration of intravenous contrast. Multiplanar reformatted images are provided for review. Automated exposure control, iterative reconstruction, and/or weight based adjustment of the mA/kV was utilized to reduce the radiation dose to as low as reasonably achievable.  COMPARISON: CT of the cervical spine dated 02/19/2024.  CLINICAL HISTORY: Neck trauma, uncomplicated (NEXUS/CCR neg) (Age 46-64y); hospital fall.  FINDINGS:  CERVICAL SPINE:  BONES AND ALIGNMENT: No acute fracture or traumatic malalignment. There is no evidence of acute traumatic injury.  DEGENERATIVE CHANGES: There is chronic degenerative disc disease at C6-C7, with bilateral uncovertebral joint hypertrophy resulting in severe bilateral neural foraminal stenosis. There is also moderate central spinal canal stenosis secondary to posterior endplate ridging.  SOFT TISSUES: No prevertebral soft tissue swelling. There is moderate calcification within the  carotid bulbs bilaterally.  IMPRESSION: 1. No evidence of acute traumatic injury 2. Chronic degenerative disc disease at C6-7 with bilateral uncovertebral joint hypertrophy resulting in severe bilateral neural foraminal stenosis and moderate central spinal canal stenosis  secondary to posterior endplate ridging 3. Moderate bilateral carotid bulb calcifications, which may reflect atherosclerosis; correlate clinically and consider cardiovascular risk assessment if not already performed  Electronically signed by: Evalene Coho MD 03/23/2024 04:05 AM EST RP Workstation: [id number] CT HEAD WO CONTRAST ( ) EXAM: CT HEAD WITHOUT CONTRAST 03/23/2024 03:54:03 AM  TECHNIQUE: CT of the head was performed without the administration of intravenous contrast. Automated exposure control, iterative reconstruction, and/or weight based adjustment of the mA/kV was utilized to reduce the radiation dose to as low as reasonably achievable.  COMPARISON: 03/17/2024  CLINICAL HISTORY: Head trauma, minor, normal mental status (Age 28-64y); hospital fall.  FINDINGS:  BRAIN AND VENTRICLES: Age related atrophy. No acute hemorrhage. No evidence of acute infarct. No hydrocephalus. No extra-axial collection. No mass effect or midline shift.  ORBITS: No acute abnormality.  SINUSES: No acute abnormality.  SOFT TISSUES AND SKULL: No acute soft tissue abnormality. No skull fracture.  IMPRESSION: 1. No acute intracranial abnormality related to the head trauma. 2. Age related atrophy.  Electronically signed by: Evalene Coho MD 03/23/2024 04:01 AM EST RP Workstation: [id number]    I personally reviewed recent imaging.   Palliative Care Assessment and Plan Summary of Established Goals of Care and Medical Treatment Preferences   Patient is a 58 year old male with a past medical history of type I bipolar disorder, tardive dyskinesia, PTSD, MDD, GAD, tremor, frequent falls, and hyperlipidemia  who was brought to the ED on 02/25/2023 by EMS for talking crazy.  Patient's roommate/friend reported patient was having frequent falls, erratic behavior, and difficulty caring for himself at home; roommate had also overheard patient stating that he may kill himself which was concerning since he has access to 2 guns in the house.  Initially patient was endorsing suicidal thoughts and IVC was obtained by psychiatry though psychiatry cleared for discharge on 02/29/2024.  Patient then noted to be encephalopathic and so was admitted to the hospital on 03/02/2024.  Hospitalization further complicated by hypotension requiring ICU admission though has currently resolved.  TOC involved to seek guardianship for patient as no family or friends willing to make decisions on patient's behalf.  Palliative medicine team consulted to assist with complex medical decision making.  # Complex medical decision making/goals of care  - Patient unable to participate in complex medical decision making due to underlying medical status.  Patient does not have friends or family willing to make decisions on his behalf.  TOC seeking guardianship for patient.  Once guardianship obtained, could engaging discussions regarding medical care moving forward.   -See: [place]  General Statutes Chapter 90. Medicine and Allied Occupations  90-21.13. Informed consent to health care treatment or procedure. In brief summary, this statute outlines that the following persons, in order indicated, are authorized to consent to medical treatment on behalf of the patient who does not demonstrate capacity to do so: Legally assigned guardian appointed by the court > healthcare agent appointed by legal healthcare power of attorney > healthcare agent appointed by the patient > legal spouse > majority of the patient's reasonably available parents and children who are at least 18 years of age > individual who has an established relationship with the patient,  who is acting in good faith on behalf of the patient, and who can reliably convey the patient's wishes > attending physician with confirmation by another physician of the patient's condition and the necessity for treatment (unless in urgent/emergent situation)   - Discussed with hospitalist about two-physician DNR based on patient's multiple  medical comorbidities.  Continuing medically appropriate and reasonable medical interventions at this time and these are being done according to excepted practice and standards of care.  In the event of cardiac or respiratory arrest, an attempt at resuscitation would be unsuccessful in returning this patient to an already fragile state of health and cause unnecessary suffering in a patient with these multiple underlying medical comorbidities. Support placing a maintaining a DNR order and support the care plan as outlined by the primary service.  - Did reach out to psychiatry for input regarding if further medication adjustments can be done for patient's underlying psychiatric disorders or if patient has end-stage treatment resistant disease.  # Psycho-social/Spiritual Support:  - TOC seeking guardianship/working with DSS  # Discharge Planning:  To Be Determined  Thank you for allowing the palliative care team to participate in the care [name].  Tinnie Radar, DO Palliative Care Provider PMT # [phone]  If patient remains symptomatic despite maximum doses, please call PMT at [phone] between 0700 and 1900. Outside of these hours, please call attending, as PMT does not have night coverage.  Personally spent 80 minutes in patient care including extensive chart review (labs, imaging, progress/consult notes, vital signs), medically appropraite exam, discussed with treatment team, education to patient, family, and staff, documenting clinical information, medication review and management, coordination of care, and available advanced directive documents.

## 2024-03-28 DIAGNOSIS — Z515 Encounter for palliative care: Secondary | ICD-10-CM | POA: Diagnosis not present

## 2024-03-28 DIAGNOSIS — G2401 Drug induced subacute dyskinesia: Secondary | ICD-10-CM | POA: Diagnosis not present

## 2024-03-28 DIAGNOSIS — R410 Disorientation, unspecified: Secondary | ICD-10-CM | POA: Diagnosis not present

## 2024-03-28 DIAGNOSIS — G9340 Encephalopathy, unspecified: Secondary | ICD-10-CM | POA: Diagnosis not present

## 2024-03-28 DIAGNOSIS — R531 Weakness: Secondary | ICD-10-CM | POA: Diagnosis not present

## 2024-03-28 DIAGNOSIS — M545 Low back pain, unspecified: Secondary | ICD-10-CM | POA: Diagnosis not present

## 2024-03-28 DIAGNOSIS — Z7189 Other specified counseling: Secondary | ICD-10-CM | POA: Diagnosis not present

## 2024-03-28 LAB — GLUCOSE, CAPILLARY: Glucose-Capillary: 138 mg/dL — ABNORMAL HIGH (ref 70–99)

## 2024-03-28 LAB — VALPROIC ACID LEVEL: Valproic Acid Lvl: 79 ug/mL (ref 50–100)

## 2024-03-28 MED ORDER — PROPRANOLOL HCL 10 MG PO TABS
10.0000 mg | ORAL_TABLET | Freq: Two times a day (BID) | ORAL | Status: DC
  Administered 2024-03-28 – 2024-05-04 (×56): 10 mg via ORAL
  Filled 2024-03-28 (×73): qty 1

## 2024-03-28 MED ORDER — ORAL CARE MOUTH RINSE: 15.0000 mL | OROMUCOSAL | Status: DC | PRN

## 2024-03-28 MED ORDER — DIVALPROEX SODIUM 500 MG PO DR TAB
625.0000 mg | DELAYED_RELEASE_TABLET | Freq: Three times a day (TID) | ORAL | Status: DC
  Administered 2024-03-28 – 2024-05-19 (×153): 625 mg via ORAL
  Filled 2024-03-28 (×139): qty 1

## 2024-03-28 NOTE — Progress Notes (Signed)
 Daily Progress Note   Patient Name: MARCIA LEPERA       Date: 03/28/2024 DOB: 06-Jan-1966  Age: 58 y.o. MRN#: [id number] Attending Physician: Kathrin Mignon DASEN, MD Primary Care Physician: [name], [name] Admit Date: 03/02/2024 Length of Stay: 21 days  Reason for Consultation/Follow-up: Establishing goals of care  Subjective:   Reviewed EMR including recent documentation from hospitalist.  Patient working with OT when seen.  Patient's RN at bedside as well.  Patient remains combative at times requiring restraints.  Discussed care with hospitalist and psychiatry today to coordinate care.  Asked psychiatry to continue appropriate adjustments of patient's medications to determine if his psychiatric disorder is at its terminal/end-stage.  Discussed should patient's psychiatric concerns continued to worsen despite appropriate aggressive medical management and medication interventions, may need to be deemed treatment resistant end-stage psychiatric disease.  Psychiatry to make adjustments of medications at this time.  Noted palliative medicine team to continue following with patient's medical journey. TOC still following to assist with APS involvement for guardianship.  Objective:   Vital Signs:  BP 112/73 (BP Location: Right Arm)   Pulse (!) 59   Temp (!) 97.4 F (36.3 C) (Oral)   Resp 18   Ht 5' 10 (1.778 m)   Wt 83 kg   SpO2 94%   BMI 26.26 kg/m   Physical Exam: General: Awake Cardiovascular: RRR Respiratory: no increased work of breathing noted, not in respiratory distress Abdomen: not distended Neuro: Awake  Assessment & Plan:   Assessment: Patient is a 58 year old male with a past medical history of type I bipolar disorder, tardive dyskinesia, PTSD, MDD, GAD, tremor, frequent falls, and hyperlipidemia who was brought to the ED on 02/25/2023 by EMS for talking crazy.  Patient's roommate/friend reported patient was having frequent falls, erratic behavior, and difficulty  caring for himself at home; roommate had also overheard patient stating that he may kill himself which was concerning since he has access to 2 guns in the house.  Initially patient was endorsing suicidal thoughts and IVC was obtained by psychiatry though psychiatry cleared for discharge on 02/29/2024.  Patient then noted to be encephalopathic and so was admitted to the hospital on 03/02/2024.  Hospitalization further complicated by hypotension requiring ICU admission though has currently resolved.  TOC involved to seek guardianship for patient as no family or friends willing to make decisions on patient's behalf.  Palliative medicine team consulted to assist with complex medical decision making.   Recommendations/Plan: # Complex medical decision making/goals of care:   - Patient unable to participate in complex medical decision making due to underlying medical status.  Patient does not have friends or family willing to make decisions on his behalf.  TOC seeking guardianship for patient.  Once guardianship obtained, could engaging discussions regarding medical care moving forward.  - Have reengaged psychiatry to determine if patient has further interventions or medication adjustments that can be pursued.  If patient no longer has appropriate medical interventions/medication adjustments or is not responding to these interventions or medications, may need to be deemed treatment resistant in terms of his underlying psychiatric disorder and then be deemed end-stage for his disorders.  Psychiatry to reassess and adjust medications and interventions as appropriate at this time.                   -Already discussed with hospitalist about two-physician DNR based on patient's multiple medical comorbidities.  Continuing medically appropriate and reasonable medical interventions at this time and  these are being done according to excepted practice and standards of care.  In the event of cardiac or respiratory arrest, an  attempt at resuscitation would be unsuccessful in returning this patient to an already fragile state of health and cause unnecessary suffering in a patient with these multiple underlying medical comorbidities. Support placing a maintaining a DNR order and support the care plan as outlined by the primary service.   # Psycho-social/Spiritual Support:  - TOC seeking guardianship/working with DSS   # Discharge Planning:  To Be Determined  Thank you for allowing the palliative care team to participate in the care [name].  Tinnie Radar, DO Palliative Care Provider PMT # [phone]  If patient remains symptomatic despite maximum doses, please call PMT at [phone] between 0700 and 1900. Outside of these hours, please call attending, as PMT does not have night coverage.

## 2024-03-28 NOTE — Consult Note (Addendum)
  Patient is a 58 year old male with a history of Bipolar I disorder, PTSD, MDD, GAD, tardive dyskinesia, type 2 diabetes, hyperlipidemia, chronic tremor, and recurrent falls. He has had an extended and medically complicated hospital course marked by acute metabolic encephalopathy, fluctuating mental status, and persistent behavioral dysregulation. Psychiatry was re-engaged via curbside request by the primary team and palliative medicine to assist with medication recommendations and to help determine whether his current psychiatric symptoms represent a potentially reversible state versus treatment-resistant or "end-stage" psychiatric disease. At this time, the patient remains in four-point restraints due to combative, aggressive, and unpredictable behavior. Review of medication administration shows he has been receiving Haldol  5 mg PO/IV approximately twice daily PRN for the past three days, in addition to scheduled Depakote  DR 750 mg BID, Cymbalta  40 mg BID, Ingrezza  80 mg daily, Cogentin  1 mg BID, and intermittent trazodone  50 mg QHS PRN, which he has not been consistently receiving.  MRI shows only mild cerebral atrophy, mild chronic small-vessel ischemic disease, and a small chronic cerebellar infarct with no acute intracranial pathology, further supporting a medical rather than primary psychiatric etiology. Given this, psychiatry does not recommend aggressive psychiatric medication escalation at this time. Instead, recommend obtaining a Depakote  level to determine whether the current regimen is therapeutic, as subtherapeutic levels may limit mood stabilization and contribute to agitation. Continue Haldol  5 mg PO/IV PRN but limit to lowest effective frequency due to increased risk of oversedation, worsening delirium, and exacerbation of tardive dyskinesia. Recommend holding trazodone  unless needed strictly for sleep, and avoid further anticholinergic burden by limiting additional sedatives. His current  behavioral disturbances appear driven by a multifactorial delirium, not refractory psychiatric disease.  At this time, recommend continued medical stabilization, delirium precautions, frequent reorientation, optimization of sleep-wake cycle, hydration, and review of all iatrogenic contributors. Given ongoing uncertainty regarding clinical trajectory, psychiatry will continue to reassess, but this patient is not yet at a point where his psychiatric illness can be labeled treatment-resistant or end-stage, as his symptoms remain largely medical in origin. If the team wishes for ongoing psychiatric management rather than curbside recommendations, please enter a formal psychiatry consult order so that serial evaluations can continue throughout his hospitalization.  Addendum: VAL 79. Increased to 625mg  po TID. Next VAL will be on 03/31/2024.Will also add  In the meantime encourage ongoing documentation from nursing to support combativeness and agitation. Current documentation from PT/OT shows patient is calm and cooperative. Can always consider adding a scheduled antipsychotic such as Abilify  or Geodon to help manage agitation.

## 2024-03-28 NOTE — Progress Notes (Addendum)
 Occupational Therapy Treatment Patient Details Name: [name] [name] MRN: [id number] DOB: 08/09/1965 Today's Date: 03/28/2024   History of present illness Pt is a 58 yr old male presenting on 10/24 for fall, AMS, and chronic back pain. Change in mental status due to acute delirium, and admitted from ED with acute metabolic encephalopathy. PMH includes: bipolar disorder type 1, tardive dyskinesia, PTSD, MDD, CAD, DM2, OA, tremors.   OT comments  The patient was seen for functional strengthening and progression of functional activity. He required assist for supine to sit and to stand using a RW. He required constant hands-on assist for steadying in standing, given intermittent posterior bias and impaired standing balance. He subsequently needed assist for short distance ambulation using a RW and set-up assist for upper body grooming seated in the chair, then for self-feeding in bed. He was further noted to be with BUE shakiness/tremulous presentation, resulting in difficulty with fine motor coordination tasks. He reported a history of chronic back pain. He was calm and cooperative and put forth good effort during the session. He did however require intermittent cues for safety awareness, sustained attention, and problem-solving, as he was with disorientation to time and situation, confusion, & decreased insight into deficits. Continue OT plan of care. Patient will benefit from continued inpatient follow up therapy, <3 hours/day.       If plan is discharge home, recommend the following:  A lot of help with walking and/or transfers;A lot of help with bathing/dressing/bathroom;Assistance with cooking/housework;Direct supervision/assist for financial management;Direct supervision/assist for medications management;Assist for transportation;Supervision due to cognitive status   Equipment Recommendations  BSC/3in1    Recommendations for Other Services      Precautions / Restrictions  Precautions Precautions: Fall Restrictions Weight Bearing Restrictions Per Provider Order: No Other Position/Activity Restrictions: chronic back pain, history of Parkinson's disease       Mobility Bed Mobility Overal bed mobility: Needs Assistance Bed Mobility: Supine to Sit     Supine to sit: Min assist, Used rails, HOB elevated     General bed mobility comments: required assist for trunk and cues to pull on bed rail as needed    Transfers Overall transfer level: Needs assistance Equipment used: Rolling walker (2 wheels) Transfers: Sit to/from Stand Sit to Stand: From elevated surface, Min assist           General transfer comment: Required min-mod assist to stand from EOB then from bedside chair. Required cues for best hand placement and to weight shift forward once in standing, given intermittent posterior lean     Balance     Sitting balance-Leahy Scale: Fair       Standing balance-Leahy Scale: Poor           ADL either performed or assessed with clinical judgement   ADL Overall ADL's : Needs assistance/impaired Eating/Feeding: Set up;Supervision/ safety;Bed level Eating/Feeding Details (indicate cue type and reason): Pt required set-up assist for self feeding in bed. He required assist to cut food, open lids, and remove tops, due to BUE shakiness/tremulous like presentation with subsequent difficulty with manipulation of feeding and food items. Consider trial of weighted utensils for self-feeding tasks. Grooming: Set up;Supervision/safety;Sitting;Oral care Grooming Details (indicate cue type and reason): The pt required assist to apply toothpaste to brush, given BUE shakiness/tremulous presentation. He subsequently performed teeth brushing seated in the bedside chair.             Lower Body Dressing: Maximal assistance;Sitting/lateral leans Lower Body Dressing Details (indicate cue type and  reason): assist needed for sock management seated EOB                       Cognition Arousal: Alert Behavior During Therapy: Impulsive, WFL for tasks assessed/performed Cognition: Cognition impaired   Orientation impairments: Situation, Time Awareness: Online awareness impaired, Intellectual awareness impaired Memory impairment (select all impairments): Short-term memory, Working civil service fast streamer, Conservation officer, historic buildings Attention impairment (select first level of impairment): Divided attention, Sustained attention Executive functioning impairment (select all impairments): Sequencing, Reasoning, Problem solving, Organization        Following commands: Impaired Following commands impaired: Follows one step commands with increased time      Cueing   Cueing Techniques: Verbal cues, Gestural cues             Pertinent Vitals/ Pain       Pain Assessment Pain Assessment: Faces Pain Score: 4  Pain Location: chronic back pain Pain Intervention(s): Limited activity within patient's tolerance, Monitored during session, Repositioned   Frequency  Min 2X/week        Progress Toward Goals  OT Goals(current goals can now be found in the care plan section)     Acute Rehab OT Goals OT Goal Formulation: With patient Time For Goal Achievement: 04/08/24 Potential to Achieve Goals: Good  Plan         AM-PAC OT 6 Clicks Daily Activity     Outcome Measure   Help from another person eating meals?: A Little Help from another person taking care of personal grooming?: A Little Help from another person toileting, which includes using toliet, bedpan, or urinal?: A Lot Help from another person bathing (including washing, rinsing, drying)?: A Lot Help from another person to put on and taking off regular upper body clothing?: A Little Help from another person to put on and taking off regular lower body clothing?: A Lot 6 Click Score: 15    End of Session Equipment Utilized During Treatment: Rolling walker (2 wheels);Gait belt  OT Visit  Diagnosis: Other abnormalities of gait and mobility (R26.89);Unsteadiness on feet (R26.81);Muscle weakness (generalized) (M62.81);History of falling (Z91.81);Other symptoms and signs involving cognitive function   Activity Tolerance Patient tolerated treatment well   Patient Left in bed;with call bell/phone within reach;with bed alarm set;with nursing/sitter in room;with restraints reapplied   Nurse Communication Mobility status        Time: 8885-8864 OT Time Calculation (min): 21 min  Charges: OT General Charges $OT Visit: 1 Visit OT Treatments $Therapeutic Activity: 8-22 mins     [name], OTR/L 03/28/2024, 1:54 PM

## 2024-03-28 NOTE — Progress Notes (Signed)
 Pt was given the opportunity to get OOB to  [facility]. Pt was able to void and have a small BM. Primary nurse and NT at bedside to trial pt without restraints. This CN was called by telemonitor to room for assistance. Pt was grabbing and attempting to hit PN and NT. CN called security to assist to obtain four point restraints and male Purewick. Pt called CN and security officer profane words. CN and RN will continue to monitor.

## 2024-03-28 NOTE — Progress Notes (Signed)
 PROGRESS NOTE  [name] [name] FMW:[id number] DOB: 05-15-1965   PCP: [name], [name]  Patient is from: Home.  Lives with friend, [name].  No immediate family members.  Only one estranged brother that does not want to talk to him.  DOA: 03/02/2024 LOS: 21  Chief complaints Chief Complaint  Patient presents with   Placement     Brief Narrative / Interim history: 58 year old M with PMH of type I bipolar disorder, tardive dyskinesia, PTSD, MDD, GAD, DM-2, tremor, frequent falls and HLD brought to ED by EMS on 10/18 due to talking crazy.   Per EDP note, roommate/friend reported problems with the patient having frequent falls in the house, difficulty caring for himself, erratic behavior including today where he had a very loud outburst in front of her, calling her names, and then was overheard stating he may kill himself.  She is concerned because he has access to 2 guns in the house.     Patient was boarded in the emergency room since 10/18 10/19, psychiatry consulted as patient was endorsing suicidal thoughts-IVC 10/22, psychiatry cleared for discharge. 10/24, patient was noted to be encephalopathic so admitted to the hospital.  11/4, MRI under sedation, transient hypotension and altered mental status-overnight in ICU. 11/5, clinically improved.  Sedation wore off.  Transferred back to MedSurg bed.    Pending safe discharge planning.   Has not demonstrated capacity to make his own medical decision. Patient has no immediate family members.  APS involved.  Two-physician DNR on 03/27/2024 after discussion with palliative medicine given patient's multiple medical comorbidity.  Psychiatry reengaged to exhaust treatment option.    Subjective: Seen and examined earlier this morning.  No major events overnight or this morning.  Patient is awake and alert but only oriented to self and place.  Combative and aggressive at times.   Assessment and plan: Acute metabolic encephalopathy in a  patient with underlying psychiatric disorder: Multifactorial including positive underlying cognitive impairment, dehydration, delirium and iatrogenic.  CT head and MRI brain without acute finding.  TSH, B12, EEG x 3 unrevealing.  CBC on 10/24 with signs of hemoconcentration.  Low suspicion for infection.  Patient remains combative, aggressive and unpredictable -Reorientation and delirium precaution -Continue intermittent bolus IV fluid to ensure hydration. -Fall precaution, TeleSitter and soft restraints - Psychiatry reengaged.  Palliative following.  Bipolar 1 disorder/MDD/GAD/PTSD: Patient remains combative, aggressive and kicking and hitting staff at times even with the adjustment of his psych medications by psychiatry.  Currently requiring restraints.   -Continue Depakote  750 mg bid, Cogentin  1 mg bid, Atarax  50 mg qhs, Ingrezza  80mg  daily, Cymbalta  40 mg bid  -Continue trazodone  and Haldol  as needed. -Palliative following.  Psychiatry reengaged to exhaust treatment options and decide if this is end-stage    Hypotension 2/2 sedation: Resolved. Essential hypertension: Normotensive. -Continue low-dose amlodipine . -Continue home Inderal -decreased to twice daily given borderline heart rate. -Continue holding lisinopril  due to risk for AKI.  NIDDM-2: A1c 6.3%.  CBG within acceptable range.  CBG monitoring discontinued. -Continue low-dose metformin .   Physical deconditioning/recurrent falls  -Therapy recommended SNF.   Hypoxemia: Resolved.  On room air.  Hyperlipidemia - Continue statin.   Vitamin D  deficiency: -Continue vitamin D  supplementation  Dehydration: dry and chapped lips - IV fluid bolus intermittently.  Goal of care: Poor long-term prognosis due to underlying complex mental health and very limited social support.  Has no capacity to make his medical decision.  Has no immediate family member.  Reportedly, only one  estranged brother that does not want to talk to him. Lived  with friend, [name] before this admission. Two person DNR after discussion with palliative medicine on 11/18. -Palliative medicine following. - APS involved for guardianship.  Increased nutrient needs Body mass index is 26.26 kg/m. Nutrition Problem: Increased nutrient needs Etiology: acute illness Signs/Symptoms: estimated needs Interventions: Ensure Enlive (each supplement provides 350kcal and 20 grams of protein), MVI   DVT prophylaxis:  enoxaparin  (LOVENOX ) injection 40 mg Start: 03/08/24 0800  Code Status: DNR. Family Communication: None at bedside Level of care: Med-Surg Status is: Inpatient Remains inpatient appropriate because: SNF bed   Final disposition: SNF   55 minutes with more than 50% spent in reviewing records, counseling patient/family and coordinating care.  Consultants:  Psychiatry Palliative medicine  Procedures: None  Microbiology summarized: MRSA PCR screen positive.  Objective: Vitals:   03/27/24 0620 03/27/24 1036 03/27/24 2032 03/28/24 0627  BP: 115/85 132/83 118/73 112/73  Pulse: 65 (!) 59 62 (!) 59  Resp: 17 16 16 18   Temp: 98.2 F (36.8 C) 97.7 F (36.5 C) 98.2 F (36.8 C) (!) 97.4 F (36.3 C)  TempSrc: Axillary Oral Oral Oral  SpO2: 94% 97% 96% 94%  Weight:      Height:        Examination:  GENERAL: No apparent distress.  Nontoxic. HEENT: Chapped and dry lips.  Vision and hearing grossly intact.  NECK: Supple.  No apparent JVD.  RESP:  No IWOB.  Fair aeration bilaterally. CVS:  RRR. Heart sounds normal.  ABD/GI/GU: BS+. Abd soft, NTND.  MSK/EXT:  Moves extremities.  Difficult muscle mass and subcu fat loss. SKIN: no apparent skin lesion or wound NEURO: Awake.  Oriented to self and place but not time.  Poor insight.  No apparent focal neuro deficit. PSYCH: Restless.  Fighting with right wrist restraints  Sch Meds:  Scheduled Meds:  amLODipine  5 mg Oral Daily   atorvastatin   20 mg Oral Daily   benztropine   1 mg Oral BID    divalproex   750 mg Oral BID   DULoxetine   40 mg Oral BID   enoxaparin  (LOVENOX ) injection  40 mg Subcutaneous Q24H   feeding supplement  237 mL Oral BID BM   hydrOXYzine   50 mg Oral QHS   metFORMIN   500 mg Oral BID WC   multivitamin with minerals  1 tablet Oral Daily   propranolol   10 mg Oral TID   valbenazine   80 mg Oral Daily   Vitamin D  (Ergocalciferol )  50,000 Units Oral Q7 days   Continuous Infusions:  lactated ringers  500 mL (03/27/24 2139)   PRN Meds:.acetaminophen  **OR** acetaminophen , artificial tears, benzocaine, bisacodyl, haloperidol **OR** haloperidol lactate, lip balm, ondansetron **OR** ondansetron (ZOFRAN) IV, mouth rinse, senna-docusate, traZODone  Antimicrobials: Anti-infectives (From admission, onward)    Start     Dose/Rate Route Frequency Ordered Stop   03/12/24 1515  amoxicillin-clavulanate (AUGMENTIN) 875-125 MG per tablet 1 tablet        1 tablet Oral Every 12 hours 03/12/24 1420 03/17/24 0959   03/12/24 1515  doxycycline  (VIBRA -TABS) tablet 100 mg  Status:  Discontinued        100 mg Oral Every 12 hours 03/12/24 1420 03/15/24 1100        I have personally reviewed the following labs and images: CBC: Recent Labs  Lab 03/22/24 0436 03/26/24 0436  WBC 6.5 7.9  HGB 16.4 17.3*  HCT 48.5 51.7  MCV 84.9 86.2  PLT 203 176  BMP &GFR Recent Labs  Lab 03/22/24 0436 03/26/24 0436  NA 137 137  K 3.6 3.8  CL 101 100  CO2 25 26  GLUCOSE 112* 104*  BUN 15 13  CREATININE 0.84 0.83  CALCIUM  9.4 9.7  MG  --  1.9  PHOS  --  3.3   Estimated Creatinine Clearance: 98.9 mL/min (by C-G formula based on SCr of 0.83 mg/dL). Liver & Pancreas: Recent Labs  Lab 03/22/24 0436 03/26/24 0436  AST 24 24  ALT 34 29  ALKPHOS 69 78  BILITOT 0.6 0.8  PROT 6.6 7.2  ALBUMIN 3.8 4.0   No results for input(s): LIPASE, AMYLASE in the last 168 hours. No results for input(s): AMMONIA in the last 168 hours. Diabetic: No results for input(s): HGBA1C in  the last 72 hours. Recent Labs  Lab 03/24/24 1055 03/24/24 1654 03/24/24 2119 03/25/24 0715 03/25/24 1211  GLUCAP 153* 117* 147* 120* 142*   Cardiac Enzymes: No results for input(s): CKTOTAL, CKMB, CKMBINDEX, TROPONINI in the last 168 hours. No results for input(s): PROBNP in the last 8760 hours. Coagulation Profile: No results for input(s): INR, PROTIME in the last 168 hours. Thyroid  Function Tests: No results for input(s): TSH, T4TOTAL, FREET4, T3FREE, THYROIDAB in the last 72 hours. Lipid Profile: No results for input(s): CHOL, HDL, LDLCALC, TRIG, CHOLHDL, LDLDIRECT in the last 72 hours. Anemia Panel: No results for input(s): VITAMINB12, FOLATE, FERRITIN, TIBC, IRON, RETICCTPCT in the last 72 hours. Urine analysis:    Component Value Date/Time   COLORURINE YELLOW 03/07/2024 2022   APPEARANCEUR CLEAR 03/07/2024 2022   LABSPEC 1.024 03/07/2024 2022   PHURINE 6.0 03/07/2024 2022   GLUCOSEU NEGATIVE 03/07/2024 2022   HGBUR NEGATIVE 03/07/2024 2022   BILIRUBINUR NEGATIVE 03/07/2024 2022   KETONESUR 5 (A) 03/07/2024 2022   PROTEINUR NEGATIVE 03/07/2024 2022   UROBILINOGEN 1.0 10/24/2008 1155   NITRITE NEGATIVE 03/07/2024 2022   LEUKOCYTESUR TRACE (A) 03/07/2024 2022   Sepsis Labs: Invalid input(s): PROCALCITONIN, LACTICIDVEN  Microbiology: No results found for this or any previous visit (from the past 240 hours).   Radiology Studies: No results found.    Jaira Canady T. Avalene Sealy [facility]  If 7PM-7AM, please contact night-coverage [email or web address] 03/28/2024, 11:24 AM

## 2024-03-28 NOTE — Plan of Care (Signed)
  Problem: Health Behavior/Discharge Planning: Goal: Ability to manage health-related needs will improve Outcome: Progressing   Problem: Clinical Measurements: Goal: Ability to maintain clinical measurements within normal limits will improve Outcome: Progressing Goal: Will remain free from infection Outcome: Progressing Goal: Diagnostic test results will improve Outcome: Progressing Goal: Respiratory complications will improve Outcome: Progressing Goal: Cardiovascular complication will be avoided Outcome: Progressing   Problem: Activity: Goal: Risk for activity intolerance will decrease Outcome: Progressing   Problem: Nutrition: Goal: Adequate nutrition will be maintained Outcome: Progressing   Problem: Coping: Goal: Level of anxiety will decrease Outcome: Progressing   Problem: Elimination: Goal: Will not experience complications related to bowel motility Outcome: Progressing Goal: Will not experience complications related to urinary retention Outcome: Progressing   Problem: Pain Managment: Goal: General experience of comfort will improve and/or be controlled Outcome: Progressing   Problem: Safety: Goal: Ability to remain free from injury will improve Outcome: Progressing   Problem: Skin Integrity: Goal: Risk for impaired skin integrity will decrease Outcome: Progressing   Problem: Education: Goal: Ability to describe self-care measures that may prevent or decrease complications (Diabetes Survival Skills Education) will improve Outcome: Progressing Goal: Individualized Educational Video(s) Outcome: Progressing   Problem: Coping: Goal: Ability to adjust to condition or change in health will improve Outcome: Progressing   Problem: Fluid Volume: Goal: Ability to maintain a balanced intake and output will improve Outcome: Progressing   Problem: Health Behavior/Discharge Planning: Goal: Ability to identify and utilize available resources and services will  improve Outcome: Progressing Goal: Ability to manage health-related needs will improve Outcome: Progressing   Problem: Metabolic: Goal: Ability to maintain appropriate glucose levels will improve Outcome: Progressing   Problem: Nutritional: Goal: Maintenance of adequate nutrition will improve Outcome: Progressing Goal: Progress toward achieving an optimal weight will improve Outcome: Progressing   Problem: Skin Integrity: Goal: Risk for impaired skin integrity will decrease Outcome: Progressing   Problem: Tissue Perfusion: Goal: Adequacy of tissue perfusion will improve Outcome: Progressing   Problem: Safety: Goal: Non-violent Restraint(s) Outcome: Progressing

## 2024-03-28 NOTE — Progress Notes (Signed)
 PT Cancellation Note  Patient Details Name: PRATT BRESS MRN: [id number] DOB: 20-Nov-1965   Cancelled Treatment:     Pt in bed, in 4 point restraints, eyes closed and body twitching.  Pt was able to participate with OT earlier per chart review.  Pt has been evaluated with rec SNF.  Rehab Team to continue to follow and attempt to see another day.  Katheryn Leap  PTA Acute  Rehabilitation Services Office M-F          [phone]

## 2024-03-28 NOTE — Progress Notes (Signed)
 Patient out of restraints to use BSC and for range of motion. Patient was doing well being out of restraints, able to take his medications. RN had to place new IV patient was verbally agreeing to new IV being placed. After IV placed, patient became verbally and physically abusive to staff, RN asked tele sitter to call for assistance from other staff. Charge RN came to the room and security was called.   With security's assistance the soft wrist/ankle restraints were placed on the patient. RN placed male purewick on the patient and removed his old IV.    Patient was left in bed resting with eyes closed, bed locked in lowest position, call bell within reach. Tele sitter in place. Lavanda Horns, NP notified of the above information.

## 2024-03-29 DIAGNOSIS — R410 Disorientation, unspecified: Secondary | ICD-10-CM | POA: Diagnosis not present

## 2024-03-29 DIAGNOSIS — R531 Weakness: Secondary | ICD-10-CM | POA: Diagnosis not present

## 2024-03-29 DIAGNOSIS — G9340 Encephalopathy, unspecified: Secondary | ICD-10-CM | POA: Diagnosis not present

## 2024-03-29 DIAGNOSIS — Z7189 Other specified counseling: Secondary | ICD-10-CM | POA: Diagnosis not present

## 2024-03-29 DIAGNOSIS — M545 Low back pain, unspecified: Secondary | ICD-10-CM | POA: Diagnosis not present

## 2024-03-29 DIAGNOSIS — G2401 Drug induced subacute dyskinesia: Secondary | ICD-10-CM | POA: Diagnosis not present

## 2024-03-29 DIAGNOSIS — Z515 Encounter for palliative care: Secondary | ICD-10-CM | POA: Diagnosis not present

## 2024-03-29 MED ORDER — LACTATED RINGERS IV BOLUS
500.0000 mL | Freq: Every day | INTRAVENOUS | Status: DC
  Administered 2024-03-30 – 2024-04-07 (×9): 500 mL via INTRAVENOUS

## 2024-03-29 NOTE — Progress Notes (Signed)
 Nutrition Follow-up  DOCUMENTATION CODES:   Not applicable  INTERVENTION:  - Carb Modified diet.  - Ensure Plus High Protein po BID, each supplement provides 350 kcal and 20 grams of protein. - Encourage intake at all meals and of supplements.  - Multivitamin with minerals daily.  - Monitor weight trends.    NUTRITION DIAGNOSIS:   Increased nutrient needs related to acute illness as evidenced by estimated needs. *ongoing  GOAL:   Patient will meet greater than or equal to 90% of their needs *progressing  MONITOR:   Supplement acceptance, PO intake, Labs, Weight trends  REASON FOR ASSESSMENT:   Consult Assessment of nutrition requirement/status  ASSESSMENT:   58 y.o. male presented to the ED with altered mental status. PMH includes bipolar disorder, PTSD, T2DM, MDD, GERD, HTN, HLD, and osteoarthritis. Pt admitted with acute encephalopathy.  Patient noted to be aggressive, combative, and per MD notes is imminent danger to self and staff.  Noted to have been trying to grab and hit staff overnight when he was taken off restraints and security had to assist to obtain 4-point restraints.  Per chart review, patient eating fairly well.  Meal Intake 11/15: 100% breakfast, 50% lunch, 100% dinner 11/16: 80% breakfast, 70% lunch, 40% dinner 11/17: 50% breakfast, 15% lunch, dinner not documented 11/18: 50% breakfast, 60% lunch, 0% dinner 11/19: 80% breakfast, 80% lunch, 50% dinner 11/20: 100% breakfast  Accepting Ensure 1x/day.   Patient weighed at 205# on initial presentation (10/18) and weighed 11/17 at 183#. This could indicate a 22# or 10.7% weight loss in 1 month, which would be significant for the time frame. Will need to monitor weight trends.   Of note palliative care consulted and has been following. As of 11/18, patient had two-physician DNR order placed. Psychiatry also following.   Medications reviewed and include: Metformin , MVI  Labs reviewed:  No BMP  since 11/17 HA1C 6.3 Blood Glucose - not checking regularly   NUTRITION - FOCUSED PHYSICAL EXAM:  Unable to obtain  Diet Order:   Diet Order             Diet Carb Modified Fluid consistency: Thin; Room service appropriate? Yes  Diet effective now                   EDUCATION NEEDS:  Not appropriate for education at this time  Skin:  Skin Assessment: Reviewed RN Assessment  Last BM:  11/17 - Type 6  Height:  Ht Readings from Last 1 Encounters:  03/07/24 5' 10 (1.778 m)   Weight:  Wt Readings from Last 1 Encounters:  03/26/24 83 kg   Ideal Body Weight:  75.5 kg  BMI:  Body mass index is 26.26 kg/m.  Estimated Nutritional Needs:  Kcal:  2100-2300 Protein:  100-120 grams Fluid:  >/= 2 L    Trude Ned RD, LDN Contact via Secure Chat.

## 2024-03-29 NOTE — Progress Notes (Signed)
 Daily Progress Note   Patient Name: [name] [name]       Date: 03/29/2024 DOB: 1966-02-22  Age: 58 y.o. MRN#: [id number] Attending Physician: Kathrin Mignon DASEN, MD Primary Care Physician: [name], [name] Admit Date: 03/02/2024 Length of Stay: 22 days  Reason for Consultation/Follow-up: Establishing goals of care  Subjective:   Reviewed EMR including recent documentation from hospitalist, psychiatry, and OT.  Patient was able to cooperate with OT today overall though was noted to have occasional impulsivity with decreased insight.  Patient still has been intermittently combative overall attempting to ground and hit staff when taken off soft restraints.  Psychiatry recommending continued medical stabilization and delirium precautions.  Psychiatry does not believe patient is yet at a point where his psychiatric illness can be labeled as treatment resistant or end-stage.  They have adjusted Depakote  to assist with management.  When seeing patient today, was interacting with OT.  Patient was calm for interaction. Reviewed last TOC note from 03/26/2024.  TOC had discussed care with DSS.  Reportedly attempting to obtain guardian for patient for medical decision making.  Discussed care with hospitalist and psychiatry to coordinate care.  Objective:   Vital Signs:  BP (!) 131/91   Pulse 65   Temp 97.6 F (36.4 C) (Oral)   Resp 16   Ht 5' 10 (1.778 m)   Wt 83 kg   SpO2 100%   BMI 26.26 kg/m   Physical Exam: General: Awake Cardiovascular: RRR Respiratory: no increased work of breathing noted, not in respiratory distress Abdomen: not distended Neuro: Awake  Assessment & Plan:   Assessment: Patient is a 58 year old male with a past medical history of type I bipolar disorder, tardive dyskinesia, PTSD, MDD, GAD, tremor, frequent falls, and hyperlipidemia who was brought to the ED on 02/25/2023 by EMS for talking crazy.  Patient's roommate/friend reported patient was having frequent  falls, erratic behavior, and difficulty caring for himself at home; roommate had also overheard patient stating that he may kill himself which was concerning since he has access to 2 guns in the house.  Initially patient was endorsing suicidal thoughts and IVC was obtained by psychiatry though psychiatry cleared for discharge on 02/29/2024.  Patient then noted to be encephalopathic and so was admitted to the hospital on 03/02/2024.  Hospitalization further complicated by hypotension requiring ICU admission though has currently resolved.  TOC involved to seek guardianship for patient as no family or friends willing to make decisions on patient's behalf.  Palliative medicine team consulted to assist with complex medical decision making.   Recommendations/Plan: # Complex medical decision making/goals of care:   - Patient unable to participate in complex medical decision making due to underlying medical status.  Patient does not have friends or family willing to make decisions on his behalf.  Psychiatry reinvolved and do not not currently deem patient to have treatment resistant/end-stage psychiatric disease.  Continuing appropriate medical management.  TOC seeking guardianship for patient.  Once guardianship obtained, could engaging discussions regarding medical care moving forward.  Palliative medicine team will be peripherally following along until that time.  Please reach out if acute changes occur.                   -Already discussed with hospitalist about two-physician DNR based on patient's multiple medical comorbidities.  Continuing medically appropriate and reasonable medical interventions at this time and these are being done according to excepted practice and standards of care.  In the event of  cardiac or respiratory arrest, an attempt at resuscitation would be unsuccessful in returning this patient to an already fragile state of health and cause unnecessary suffering in a patient with these multiple  underlying medical comorbidities. Support placing a maintaining a DNR order and support the care plan as outlined by the primary service.   # Psycho-social/Spiritual Support:  - TOC seeking guardianship/working with DSS   # Discharge Planning:  To Be Determined  Thank you for allowing the palliative care team to participate in the care [name] [name].  Tinnie Radar, DO Palliative Care Provider PMT # [phone]  If patient remains symptomatic despite maximum doses, please call PMT at [phone] between 0700 and 1900. Outside of these hours, please call attending, as PMT does not have night coverage.

## 2024-03-29 NOTE — Progress Notes (Signed)
 PROGRESS NOTE  [name] [name] FMW:[id number] DOB: [birth year]   PCP: [name], [name]  Patient is from: Home.  Lives with friend, [name].  No immediate family members.  Only one estranged brother that does not want to talk to him.  DOA: 03/02/2024 LOS: 22  Chief complaints Chief Complaint  Patient presents with   Placement     Brief Narrative / Interim history: 58 year old M with PMH of type I bipolar disorder, tardive dyskinesia, PTSD, MDD, GAD, DM-2, tremor, frequent falls and HLD brought to ED by EMS on 10/18 due to talking crazy.   Per EDP note, roommate/friend reported problems with the patient having frequent falls in the house, difficulty caring for himself, erratic behavior including today where he had a very loud outburst in front of her, calling her names, and then was overheard stating he may kill himself.  She is concerned because he has access to 2 guns in the house.     Patient was boarded in the emergency room since 10/18 10/19, psychiatry consulted as patient was endorsing suicidal thoughts-IVC 10/22, psychiatry cleared for discharge. 10/24, patient was noted to be encephalopathic so admitted to the hospital.  11/4, MRI under sedation, transient hypotension and altered mental status-overnight in ICU. 11/5, clinically improved.  Sedation wore off.  Transferred back to MedSurg bed.    Pending safe discharge planning.   Has not demonstrated capacity to make his own medical decision. Patient has no immediate family members.  APS involved.  Two-physician DNR on 03/27/2024 after discussion with palliative medicine given patient's multiple medical comorbidity.  Psychiatry reengaged to exhaust treatment option.    Subjective: Seen and examined earlier this morning.  No major events overnight or this morning.  Intermittently combative attempting to grab and hit staff overnight when he was taken off soft restraints.  Security had to assist to obtain 4-point restraints.   Sleepy this morning.   Assessment and plan: Acute metabolic encephalopathy in a patient with underlying psychiatric disorder: Multifactorial including positive underlying cognitive impairment, dehydration, delirium and iatrogenic.  CT head and MRI brain without acute finding to explain patient's behavioral issue or encephalopathy.  TSH, B12, EEG x 3 unrevealing.  CBC on 10/24 with signs of hemoconcentration.  Low suspicion for infection.  Patient remains combative, aggressive and unpredictable.  He is imminent danger to self and staff. -Reorientation and delirium precaution -Continue intermittent bolus IV fluid to ensure hydration. -Fall precaution, TeleSitter and soft restraints -Psychiatry reengaged and following.  Bipolar 1 disorder/MDD/GAD/PTSD: Patient remains combative, aggressive and kicking and hitting staff at times even with the adjustment of his psych medications by psychiatry.  Currently requiring restraints.   -Continue Cogentin  1 mg bid, Atarax  50 mg qhs, Ingrezza  80mg  daily, Cymbalta  40 mg bid  -Psychiatry increase Depakote  from 750 mg twice daily to 625 mg 3 times daily.  Depakote  level was normal at 79. -Continue trazodone and Haldol as needed. - Palliative and psychiatry following.    Hypotension 2/2 sedation: Resolved. Essential hypertension: Normotensive. -Continue low-dose amlodipine. -Continue home Inderal -decreased to twice daily given borderline heart rate. -Continue holding lisinopril  due to risk for AKI.  NIDDM-2: A1c 6.3%.  CBG within acceptable range.  CBG monitoring discontinued. -Continue low-dose metformin .   Physical deconditioning/recurrent falls  -Therapy recommended SNF.   Hypoxemia: Resolved.  On room air.  Hyperlipidemia - Continue statin.   Vitamin D  deficiency: -Continue vitamin D  supplementation  Dehydration: dry and chapped lips - IV fluid bolus intermittently.  Goal of care: Poor  long-term prognosis due to underlying complex mental  health and very limited social support.  Has no capacity to make his medical decision.  Has no immediate family member.  Reportedly, only one estranged brother that does not want to talk to him. Lived with friend, [name] before this admission. Two person DNR after discussion with palliative medicine on 11/18. -Palliative medicine following. - APS involved for guardianship.  Increased nutrient needs Body mass index is 26.26 kg/m. Nutrition Problem: Increased nutrient needs Etiology: acute illness Signs/Symptoms: estimated needs Interventions: Ensure Enlive (each supplement provides 350kcal and 20 grams of protein), MVI   DVT prophylaxis:  enoxaparin  (LOVENOX ) injection 40 mg Start: 03/08/24 0800  Code Status: DNR. Family Communication: None at bedside Level of care: Med-Surg Status is: Inpatient Remains inpatient appropriate because: SNF bed   Final disposition: SNF   35 minutes with more than 50% spent in reviewing records, counseling patient/family and coordinating care.  Consultants:  Psychiatry Palliative medicine  Procedures: None  Microbiology summarized: MRSA PCR screen positive.  Objective: Vitals:   03/28/24 2246 03/29/24 0520 03/29/24 0925 03/29/24 0926  BP: 114/72 119/76 (!) 131/91 (!) 131/91  Pulse: 72 (!) 55 65 65  Resp:  16    Temp:  97.6 F (36.4 C)    TempSrc:  Oral    SpO2:  93% 100%   Weight:      Height:        Examination:  GENERAL: No apparent distress.  Nontoxic. HEENT: Chapped and dry lips.  Vision and hearing grossly intact.  NECK: Supple.  No apparent JVD.  RESP:  No IWOB.  On room air. CVS:  RRR. Heart sounds normal.  ABD/GI/GU: BS+. Abd soft, NTND.  MSK/EXT:  Moves extremities.  4 point restraints in place. SKIN: no apparent skin lesion or wound NEURO: Sleepy.  No apparent focal neuro deficit. PSYCH: Currently sleepy.  Sch Meds:  Scheduled Meds:  amLODipine   5 mg Oral Daily   atorvastatin   20 mg Oral Daily   benztropine   1  mg Oral BID   divalproex   625 mg Oral Q8H   DULoxetine   40 mg Oral BID   enoxaparin  (LOVENOX ) injection  40 mg Subcutaneous Q24H   feeding supplement  237 mL Oral BID BM   hydrOXYzine   50 mg Oral QHS   metFORMIN   500 mg Oral BID WC   multivitamin with minerals  1 tablet Oral Daily   propranolol   10 mg Oral BID   valbenazine   80 mg Oral Daily   Continuous Infusions:  lactated ringers  Stopped (03/29/24 0029)   PRN Meds:.acetaminophen  **OR** acetaminophen , artificial tears, benzocaine , bisacodyl , haloperidol  **OR** haloperidol  lactate, lip balm, ondansetron  **OR** ondansetron  (ZOFRAN ) IV, mouth rinse, senna-docusate, traZODone   Antimicrobials: Anti-infectives (From admission, onward)    Start     Dose/Rate Route Frequency Ordered Stop   03/12/24 1515  amoxicillin -clavulanate (AUGMENTIN ) 875-125 MG per tablet 1 tablet        1 tablet Oral Every 12 hours 03/12/24 1420 03/17/24 0959   03/12/24 1515  doxycycline  (VIBRA -TABS) tablet 100 mg  Status:  Discontinued        100 mg Oral Every 12 hours 03/12/24 1420 03/15/24 1100        I have personally reviewed the following labs and images: CBC: Recent Labs  Lab 03/26/24 0436  WBC 7.9  HGB 17.3*  HCT 51.7  MCV 86.2  PLT 176   BMP &GFR Recent Labs  Lab 03/26/24 0436  NA 137  K 3.8  CL 100  CO2 26  GLUCOSE 104*  BUN 13  CREATININE 0.83  CALCIUM  9.7  MG 1.9  PHOS 3.3   Estimated Creatinine Clearance: 98.9 mL/min (by C-G formula based on SCr of 0.83 mg/dL). Liver & Pancreas: Recent Labs  Lab 03/26/24 0436  AST 24  ALT 29  ALKPHOS 78  BILITOT 0.8  PROT 7.2  ALBUMIN  4.0   No results for input(s): LIPASE, AMYLASE in the last 168 hours. No results for input(s): AMMONIA in the last 168 hours. Diabetic: No results for input(s): HGBA1C in the last 72 hours. Recent Labs  Lab 03/24/24 1654 03/24/24 2119 03/25/24 0715 03/25/24 1211 03/28/24 1950  GLUCAP 117* 147* 120* 142* 138*   Cardiac Enzymes: No  results for input(s): CKTOTAL, CKMB, CKMBINDEX, TROPONINI in the last 168 hours. No results for input(s): PROBNP in the last 8760 hours. Coagulation Profile: No results for input(s): INR, PROTIME in the last 168 hours. Thyroid  Function Tests: No results for input(s): TSH, T4TOTAL, FREET4, T3FREE, THYROIDAB in the last 72 hours. Lipid Profile: No results for input(s): CHOL, HDL, LDLCALC, TRIG, CHOLHDL, LDLDIRECT in the last 72 hours. Anemia Panel: No results for input(s): VITAMINB12, FOLATE, FERRITIN, TIBC, IRON, RETICCTPCT in the last 72 hours. Urine analysis:    Component Value Date/Time   COLORURINE YELLOW 03/07/2024 2022   APPEARANCEUR CLEAR 03/07/2024 2022   LABSPEC 1.024 03/07/2024 2022   PHURINE 6.0 03/07/2024 2022   GLUCOSEU NEGATIVE 03/07/2024 2022   HGBUR NEGATIVE 03/07/2024 2022   BILIRUBINUR NEGATIVE 03/07/2024 2022   KETONESUR 5 (A) 03/07/2024 2022   PROTEINUR NEGATIVE 03/07/2024 2022   UROBILINOGEN 1.0 10/24/2008 1155   NITRITE NEGATIVE 03/07/2024 2022   LEUKOCYTESUR TRACE (A) 03/07/2024 2022   Sepsis Labs: Invalid input(s): PROCALCITONIN, LACTICIDVEN  Microbiology: No results found for this or any previous visit (from the past 240 hours).   Radiology Studies: No results found.    Avi Archuleta T. Frankee Gritz [facility]  If 7PM-7AM, please contact night-coverage [email or web address] 03/29/2024, 11:18 AM

## 2024-03-29 NOTE — Plan of Care (Signed)
  Problem: Health Behavior/Discharge Planning: Goal: Ability to manage health-related needs will improve Outcome: Progressing   Problem: Clinical Measurements: Goal: Ability to maintain clinical measurements within normal limits will improve Outcome: Progressing Goal: Will remain free from infection Outcome: Progressing Goal: Diagnostic test results will improve Outcome: Progressing Goal: Respiratory complications will improve Outcome: Progressing Goal: Cardiovascular complication will be avoided Outcome: Progressing   Problem: Activity: Goal: Risk for activity intolerance will decrease Outcome: Progressing   Problem: Nutrition: Goal: Adequate nutrition will be maintained Outcome: Progressing   Problem: Coping: Goal: Level of anxiety will decrease Outcome: Progressing   Problem: Elimination: Goal: Will not experience complications related to bowel motility Outcome: Progressing Goal: Will not experience complications related to urinary retention Outcome: Progressing   Problem: Pain Managment: Goal: General experience of comfort will improve and/or be controlled Outcome: Progressing   Problem: Safety: Goal: Ability to remain free from injury will improve Outcome: Progressing   Problem: Skin Integrity: Goal: Risk for impaired skin integrity will decrease Outcome: Progressing   Problem: Education: Goal: Ability to describe self-care measures that may prevent or decrease complications (Diabetes Survival Skills Education) will improve Outcome: Progressing Goal: Individualized Educational Video(s) Outcome: Progressing   Problem: Coping: Goal: Ability to adjust to condition or change in health will improve Outcome: Progressing   Problem: Fluid Volume: Goal: Ability to maintain a balanced intake and output will improve Outcome: Progressing   Problem: Health Behavior/Discharge Planning: Goal: Ability to identify and utilize available resources and services will  improve Outcome: Progressing Goal: Ability to manage health-related needs will improve Outcome: Progressing   Problem: Metabolic: Goal: Ability to maintain appropriate glucose levels will improve Outcome: Progressing   Problem: Nutritional: Goal: Maintenance of adequate nutrition will improve Outcome: Progressing Goal: Progress toward achieving an optimal weight will improve Outcome: Progressing   Problem: Skin Integrity: Goal: Risk for impaired skin integrity will decrease Outcome: Progressing   Problem: Tissue Perfusion: Goal: Adequacy of tissue perfusion will improve Outcome: Progressing   Problem: Safety: Goal: Non-violent Restraint(s) Outcome: Progressing

## 2024-03-29 NOTE — TOC Progression Note (Signed)
 Transition of Care [facility]) - Progression Note    Patient Details  Name: [name] [name] MRN: [id number] Date of Birth: 12/11/1965  Transition of [facility]) CM/SW Contact  Doneta Glenys DASEN, RN Phone Number: 03/29/2024, 4:18 PM  Clinical Narrative:    CM met with APS Terris Gosling Social Worker [phone]. Discussed  patient current psychiatric state, FL2 and mental capacity to make medical decision. APS Shefi a form that address Medical Decision Making Capacity. Per [facility] Dr. Kathrin documentation on 11/19 under Goal of care: Poor long-term prognosis due to underlying complex mental health and very limited social support.  Has no capacity to make his medical decision. Is not sufficient documentation. CM asked Wilkie Majel [name], [name] with Psychiatry if she would be willing to address patients' capacity to make his medical decision.   Barriers to Discharge: SNF Pending bed offer               Expected Discharge Plan and Services                                               Social Drivers of Health (SDOH) Interventions SDOH Screenings   Food Insecurity: Patient Unable To Answer (03/07/2024)  Housing: Unknown (03/07/2024)  Transportation Needs: Patient Unable To Answer (03/07/2024)  Utilities: Patient Unable To Answer (03/07/2024)  Depression (PHQ2-9): High Risk (01/26/2024)  Social Connections: Unknown (09/22/2021)   Received from [facility]  Tobacco Use: Low Risk  (03/13/2024)    Readmission Risk Interventions     No data to display

## 2024-03-29 NOTE — Plan of Care (Signed)
  Problem: Clinical Measurements: Goal: Respiratory complications will improve Outcome: Progressing Goal: Cardiovascular complication will be avoided Outcome: Progressing   Problem: Activity: Goal: Risk for activity intolerance will decrease Outcome: Progressing   Problem: Coping: Goal: Level of anxiety will decrease Outcome: Progressing   Problem: Elimination: Goal: Will not experience complications related to bowel motility Outcome: Progressing Goal: Will not experience complications related to urinary retention Outcome: Progressing   Problem: Pain Managment: Goal: General experience of comfort will improve and/or be controlled Outcome: Progressing   Problem: Safety: Goal: Ability to remain free from injury will improve Outcome: Progressing   Problem: Skin Integrity: Goal: Risk for impaired skin integrity will decrease Outcome: Progressing   Problem: Fluid Volume: Goal: Ability to maintain a balanced intake and output will improve Outcome: Progressing   Problem: Metabolic: Goal: Ability to maintain appropriate glucose levels will improve Outcome: Progressing   Problem: Safety: Goal: Non-violent Restraint(s) Outcome: Progressing

## 2024-03-29 NOTE — Progress Notes (Signed)
 Occupational Therapy Treatment Patient Details Name: [name] [name] MRN: [id number] DOB: 1965-10-22 Today's Date: 03/29/2024   History of present illness Pt is a 58 yr old male presenting on 10/24 for fall and chronic back pain. Change in mental status due to acute delirium, and admitted from ED with acute metabolic encephalopathy. PMH includes: bipolar disorder type 1, tardive dyskinesia, PTSD, MDD, CAD, DM2, OA, tremors.   OT comments  The patient was seen for functional strengthening, ADL instruction, and progression of functional activity. He required min assist with increased time and effort for supine to sit. Once seated EOB, he required min assist to donn a hospital gown and set-up assist for hand washing. He performed one stand using a RW,  requiring assist to maintain balance, due to posterior lean. Pt required increased cues to avoid environmental obstacles when he didn't have on his glasses. He reported improved vision once his glasses were put on. He was calm and cooperative throughout, though occasionally impulsive and with decreased insight. Continue OT plan of care. Patient will benefit from continued inpatient follow up therapy, <3 hours/day.        If plan is discharge home, recommend the following:  A lot of help with walking and/or transfers;A lot of help with bathing/dressing/bathroom;Assistance with cooking/housework;Direct supervision/assist for financial management;Direct supervision/assist for medications management;Assist for transportation;Supervision due to cognitive status   Equipment Recommendations  BSC/3in1    Recommendations for Other Services      Precautions / Restrictions Precautions Precautions: Fall Restrictions Other Position/Activity Restrictions: chronic back pain, history of Parkinson's disease       Mobility Bed Mobility Overal bed mobility: Needs Assistance Bed Mobility: Supine to Sit, Sit to Supine     Supine to sit: Min assist, Used  rails, HOB elevated Sit to supine: Min assist        Transfers Overall transfer level: Needs assistance Equipment used: Rolling walker (2 wheels) Transfers: Sit to/from Stand Sit to Stand: From elevated surface, Min assist           General transfer comment: Patient required assist and cues to correct posterior lean, as well as for upright posture.     Balance       Sitting balance - Comments: static sitting-good. dynamic sitting-fair+     Standing balance-Leahy Scale: Poor           ADL either performed or assessed with clinical judgement   ADL Overall ADL's : Needs assistance/impaired Eating/Feeding: Set up;Supervision/ safety;Bed level Eating/Feeding Details (indicate cue type and reason): He drank from a cup at bed level. Grooming: Supervision/safety;Set up;Sitting Grooming Details (indicate cue type and reason): He performed face washing seated EOB.         Upper Body Dressing : Minimal assistance;Sitting;Cueing for sequencing Upper Body Dressing Details (indicate cue type and reason): He required assist to doff a hospital gown, then to donn another one in sitting EOB. Lower Body Dressing: Maximal assistance;Sitting/lateral leans Lower Body Dressing Details (indicate cue type and reason): Patient required increased assist to donn his socks seated EOB. He had difficulty flexing at the hips to access his feet, as well as implementing the figure 4 position to perform.                     Vision Baseline Vision/History: 1 Wears glasses Additional Comments: Pt required increased cues to avoid environmental obstacles when he didn't have on his glasses. He reported improved vision once his glasses were put on.  Cognition Arousal: Alert Behavior During Therapy: Impulsive, WFL for tasks assessed/performed Cognition: Cognition impaired   Orientation impairments: Time, Situation Awareness: Online awareness impaired, Intellectual awareness  impaired Memory impairment (select all impairments): Short-term memory, Working civil service fast streamer, Conservation officer, historic buildings Attention impairment (select first level of impairment): Divided attention, Sustained attention Executive functioning impairment (select all impairments): Reasoning, Problem solving, Organization          Following commands: Impaired Following commands impaired: Follows one step commands with increased time      Cueing   Cueing Techniques: Verbal cues, Gestural cues             Pertinent Vitals/ Pain       Pain Assessment Pain Assessment: No/denies pain   Frequency  Min 2X/week        Progress Toward Goals  OT Goals(current goals can now be found in the care plan section)  Progress towards OT goals: Progressing toward goals  Acute Rehab OT Goals Patient Stated Goal: to go home OT Goal Formulation: With patient Time For Goal Achievement: 04/08/24 Potential to Achieve Goals: Good  Plan         AM-PAC OT 6 Clicks Daily Activity     Outcome Measure   Help from another person eating meals?: A Little Help from another person taking care of personal grooming?: A Little Help from another person toileting, which includes using toliet, bedpan, or urinal?: A Lot Help from another person bathing (including washing, rinsing, drying)?: A Lot Help from another person to put on and taking off regular upper body clothing?: A Little Help from another person to put on and taking off regular lower body clothing?: A Lot 6 Click Score: 15    End of Session Equipment Utilized During Treatment: Gait belt;Rolling walker (2 wheels)  OT Visit Diagnosis: Other abnormalities of gait and mobility (R26.89);Unsteadiness on feet (R26.81);Muscle weakness (generalized) (M62.81);History of falling (Z91.81);Other symptoms and signs involving cognitive function   Activity Tolerance Patient tolerated treatment well   Patient Left in bed;with call bell/phone within reach;with  bed alarm set;with nursing/sitter in room   Nurse Communication Mobility status        Time: 8975-8955 OT Time Calculation (min): 20 min  Charges: OT General Charges $OT Visit: 1 Visit OT Treatments $Self Care/Home Management : 8-22 mins    [name], OTR/L 03/29/2024, 12:28 PM

## 2024-03-30 DIAGNOSIS — G9340 Encephalopathy, unspecified: Secondary | ICD-10-CM | POA: Diagnosis not present

## 2024-03-30 DIAGNOSIS — R451 Restlessness and agitation: Secondary | ICD-10-CM | POA: Diagnosis not present

## 2024-03-30 DIAGNOSIS — M545 Low back pain, unspecified: Secondary | ICD-10-CM | POA: Diagnosis not present

## 2024-03-30 DIAGNOSIS — R410 Disorientation, unspecified: Secondary | ICD-10-CM | POA: Diagnosis not present

## 2024-03-30 DIAGNOSIS — R531 Weakness: Secondary | ICD-10-CM | POA: Diagnosis not present

## 2024-03-30 MED ORDER — HALOPERIDOL 5 MG PO TABS: 5.0000 mg | ORAL_TABLET | Freq: Three times a day (TID) | ORAL | Status: DC | PRN

## 2024-03-30 MED ORDER — HALOPERIDOL LACTATE 5 MG/ML IJ SOLN: 5.0000 mg | Freq: Three times a day (TID) | INTRAMUSCULAR | Status: DC | PRN

## 2024-03-30 NOTE — Plan of Care (Signed)
  Problem: Health Behavior/Discharge Planning: Goal: Ability to manage health-related needs will improve Outcome: Progressing   Problem: Clinical Measurements: Goal: Ability to maintain clinical measurements within normal limits will improve Outcome: Progressing Goal: Will remain free from infection Outcome: Progressing Goal: Diagnostic test results will improve Outcome: Progressing Goal: Respiratory complications will improve Outcome: Progressing Goal: Cardiovascular complication will be avoided Outcome: Progressing   Problem: Activity: Goal: Risk for activity intolerance will decrease Outcome: Progressing   Problem: Nutrition: Goal: Adequate nutrition will be maintained Outcome: Progressing   Problem: Elimination: Goal: Will not experience complications related to bowel motility Outcome: Progressing Goal: Will not experience complications related to urinary retention Outcome: Progressing   Problem: Pain Managment: Goal: General experience of comfort will improve and/or be controlled Outcome: Progressing   Problem: Safety: Goal: Ability to remain free from injury will improve Outcome: Progressing   Problem: Skin Integrity: Goal: Risk for impaired skin integrity will decrease Outcome: Progressing   Problem: Education: Goal: Ability to describe self-care measures that may prevent or decrease complications (Diabetes Survival Skills Education) will improve Outcome: Progressing Goal: Individualized Educational Video(s) Outcome: Progressing   Problem: Coping: Goal: Ability to adjust to condition or change in health will improve Outcome: Progressing   Problem: Fluid Volume: Goal: Ability to maintain a balanced intake and output will improve Outcome: Progressing   Problem: Health Behavior/Discharge Planning: Goal: Ability to identify and utilize available resources and services will improve Outcome: Progressing Goal: Ability to manage health-related needs will  improve Outcome: Progressing   Problem: Metabolic: Goal: Ability to maintain appropriate glucose levels will improve Outcome: Progressing   Problem: Nutritional: Goal: Maintenance of adequate nutrition will improve Outcome: Progressing Goal: Progress toward achieving an optimal weight will improve Outcome: Progressing   Problem: Skin Integrity: Goal: Risk for impaired skin integrity will decrease Outcome: Progressing   Problem: Tissue Perfusion: Goal: Adequacy of tissue perfusion will improve Outcome: Progressing   Problem: Coping: Goal: Level of anxiety will decrease Outcome: Not Progressing   Problem: Safety: Goal: Non-violent Restraint(s) Outcome: Not Progressing

## 2024-03-30 NOTE — Plan of Care (Signed)
  Problem: Health Behavior/Discharge Planning: Goal: Ability to manage health-related needs will improve Outcome: Progressing   Problem: Clinical Measurements: Goal: Ability to maintain clinical measurements within normal limits will improve Outcome: Progressing Goal: Will remain free from infection Outcome: Progressing Goal: Diagnostic test results will improve Outcome: Progressing Goal: Respiratory complications will improve Outcome: Progressing Goal: Cardiovascular complication will be avoided Outcome: Progressing   Problem: Activity: Goal: Risk for activity intolerance will decrease Outcome: Progressing   Problem: Nutrition: Goal: Adequate nutrition will be maintained Outcome: Progressing   Problem: Coping: Goal: Level of anxiety will decrease Outcome: Progressing   Problem: Elimination: Goal: Will not experience complications related to bowel motility Outcome: Progressing Goal: Will not experience complications related to urinary retention Outcome: Progressing   Problem: Pain Managment: Goal: General experience of comfort will improve and/or be controlled Outcome: Progressing   Problem: Safety: Goal: Ability to remain free from injury will improve Outcome: Progressing   Problem: Skin Integrity: Goal: Risk for impaired skin integrity will decrease Outcome: Progressing   Problem: Education: Goal: Ability to describe self-care measures that may prevent or decrease complications (Diabetes Survival Skills Education) will improve Outcome: Progressing Goal: Individualized Educational Video(s) Outcome: Progressing   Problem: Coping: Goal: Ability to adjust to condition or change in health will improve Outcome: Progressing   Problem: Fluid Volume: Goal: Ability to maintain a balanced intake and output will improve Outcome: Progressing   Problem: Health Behavior/Discharge Planning: Goal: Ability to identify and utilize available resources and services will  improve Outcome: Progressing Goal: Ability to manage health-related needs will improve Outcome: Progressing   Problem: Metabolic: Goal: Ability to maintain appropriate glucose levels will improve Outcome: Progressing   Problem: Nutritional: Goal: Maintenance of adequate nutrition will improve Outcome: Progressing Goal: Progress toward achieving an optimal weight will improve Outcome: Progressing   Problem: Skin Integrity: Goal: Risk for impaired skin integrity will decrease Outcome: Progressing   Problem: Tissue Perfusion: Goal: Adequacy of tissue perfusion will improve Outcome: Progressing   Problem: Safety: Goal: Non-violent Restraint(s) Outcome: Progressing

## 2024-03-30 NOTE — Progress Notes (Signed)
 Physical Therapy Treatment Patient Details Name: [name] [name] MRN: [id number] DOB: August 24, 1965 Today's Date: 03/30/2024   History of Present Illness Pt is a 58 y/o male presenting on 10/24 for fall and chronic back pain. 10/19 psychiatry consulted due to suicidal thoughts, cleared on 10/22. Change in mental status due to acute delirium, and admitted from ED with acute metabolic encephalopathy. CT negative, EEG negative, MRI pending, chest x ray with hypoventilation. PMH includes: bipolar disorder type 1, tardive dyskinesia, PTSD, MDD, CAD, DM2, OA, tremors.    PT Comments  PT - Cognition Comments: AxO x 1 requiring repeat commands.  Able to express self some.  In bed with four pint restraints.  Easily aggitated.  Appears angry today expressing a dislike to being tied up.  Required + 3 assist to get OOB and amb due to impaired self safety awareness and instability with severe posterior lean.  Pt expressed a dislike to any pushing/pulling.  Defensive reaction. Required increased VC's to ensure self safety.  When removing B LE restraints became demonstrated increased agitation and kicked Therapist.  Switched approach to allow our male Rehab Tech take the lead and converse with Pt while all other staff members assisted but remained silent.  Approach to minimize overstimulation and redirect.  Assisted to [facility] as Pt expressed a need to go to the bathroom.  Required + 2 assist with greatest difficulty completing turns and severe posterior lean.  Pt did have a small loose BM.   Assisted with peri care.  Assisted with amb in hallway.  General Gait Details: tolerated an increased distance with decreased ataxia.  Required + 2 assist such that recliner was following.  Used pleasant conservation to distract Pt and decrease his anxiety.  Also played some music from the 80's. Pt returned to room in recliner.  Pleasant/engaging.  He was hungry, so was given a sandwich and a drink.  Seated upright, Pt was able to  feed self.  Applied waist restraint but did not need to reapply wrist restraints.     If plan is discharge home, recommend the following:     Can travel by private vehicle        Equipment Recommendations       Recommendations for Other Services       Precautions / Restrictions Precautions Precautions: Fall Precaution/Restrictions Comments: Parkinson's Restrictions Weight Bearing Restrictions Per Provider Order: No Other Position/Activity Restrictions: chronic back pain, history of Parkinson's disease     Mobility  Bed Mobility Overal bed mobility: Needs Assistance Bed Mobility: Supine to Sit     Supine to sit: Min assist, Mod assist     General bed mobility comments: required assist for trunk and cues to pull on bed rail as needed with increased assist to complete scooting to EOB.    Transfers Overall transfer level: Needs assistance Equipment used: Rolling walker (2 wheels) Transfers: Sit to/from Stand Sit to Stand: From elevated surface, Min assist, Mod assist           General transfer comment: Patient required repeat directions/Instructions plus assist and cues to correct posterior lean, as well as for upright posture.    Ambulation/Gait Ambulation/Gait assistance: Min assist, Mod assist Gait Distance (Feet): 220 Feet Assistive device: Rolling walker (2 wheels) Gait Pattern/deviations: Step-through pattern, Decreased stride length, Narrow base of support Gait velocity: decreased     General Gait Details: tolerated an increased distance with decreased ataxia.  Required + 2 assist such that recliner was following.  Used pleasant conservation  to distract Pt and decrease his anxiety.   Stairs             Wheelchair Mobility     Tilt Bed    Modified Rankin (Stroke Patients Only)       Balance                                            Communication    Cognition Arousal: Alert Behavior During Therapy: Impulsive, WFL  for tasks assessed/performed                           PT - Cognition Comments: AxO x 1 requiring repeat commands.  Able to express self some.  In bed with four pint restraints.  Easily aggitated.  Appears angry today expressing a dislike to being tied up.  Required + 3 assist to get OOB and amb due to impaired self safety awareness and instability with severe posterior lean.  Pt expressed a dislike to any pushing/pulling.  Defensive reaction. Required increased VC's to ensure self safety. Following commands: Impaired Following commands impaired: Follows one step commands with increased time    Cueing Cueing Techniques: Verbal cues, Gestural cues  Exercises      General Comments        Pertinent Vitals/Pain Pain Assessment Pain Assessment: Faces Faces Pain Scale: Hurts a little bit Pain Location: chronic back pain    Home Living                          Prior Function            PT Goals (current goals can now be found in the care plan section) Progress towards PT goals: Progressing toward goals    Frequency           PT Plan      Co-evaluation              AM-PAC PT 6 Clicks Mobility   Outcome Measure                   End of Session Equipment Utilized During Treatment: Gait belt Activity Tolerance: Patient tolerated treatment well Patient left: in chair;with call bell/phone within reach;with chair alarm set;with nursing/sitter in room;with restraints reapplied Nurse Communication: Mobility status PT Visit Diagnosis: History of falling (Z91.81);Unsteadiness on feet (R26.81);Other abnormalities of gait and mobility (R26.89);Muscle weakness (generalized) (M62.81);Repeated falls (R29.6);Pain     Time: 1012-1045 PT Time Calculation (min) (ACUTE ONLY): 33 min  Charges:    $Gait Training: 8-22 mins $Therapeutic Activity: 8-22 mins PT General Charges $$ ACUTE PT VISIT: 1 Visit                    Katheryn Leap   PTA Acute  Rehabilitation Services Office M-F          [phone]

## 2024-03-30 NOTE — Progress Notes (Signed)
 PROGRESS NOTE  [name] [name] FMW:[id number] DOB: Oct 07, 1965   PCP: [name], [name]  Patient is from: Home.  Lives with friend, [name].  No immediate family members.  Only one estranged brother that does not want to talk to him.  DOA: 03/02/2024 LOS: 23  Chief complaints Chief Complaint  Patient presents with   Placement     Brief Narrative / Interim history: 58 year old M with PMH of type I bipolar disorder, tardive dyskinesia, PTSD, MDD, GAD, DM-2, tremor, frequent falls and HLD brought to ED by EMS on 10/18 due to talking crazy.   Per EDP note, roommate/friend reported problems with the patient having frequent falls in the house, difficulty caring for himself, erratic behavior including today where he had a very loud outburst in front of her, calling her names, and then was overheard stating he may kill himself.  She is concerned because he has access to 2 guns in the house.     Patient was boarded in the emergency room since 10/18 10/19, psychiatry consulted as patient was endorsing suicidal thoughts-IVC 10/22, psychiatry cleared for discharge. 10/24, patient was noted to be encephalopathic so admitted to the hospital.  11/4, MRI under sedation, transient hypotension and altered mental status-overnight in ICU. 11/5, clinically improved.  Sedation wore off.  Transferred back to MedSurg bed.    Pending safe discharge planning.   Has not demonstrated capacity to make his own medical decision. Patient has no immediate family members.  APS involved.  Two-physician DNR on 03/27/2024 after discussion with palliative medicine given patient's multiple medical comorbidity.  Psychiatry reengaged to exhaust treatment option.    Subjective: Seen and examined earlier this morning.  No major events overnight or this morning.  He says get me out of this place.  Reports back pain but does not elaborate.  Responds no to orientation questions.   Assessment and plan: Acute metabolic  encephalopathy in a patient with underlying psychiatric disorder: Multifactorial including positive underlying cognitive impairment, dehydration, delirium and iatrogenic.  CT head and MRI brain without acute finding to explain patient's behavioral issue or encephalopathy.  TSH, B12, EEG x 3 unrevealing.  CBC on 10/24 with signs of hemoconcentration.  Low suspicion for infection.  Patient remains combative, aggressive and unpredictable.  He is imminent danger to self and staff. -Reorientation and delirium precaution -Continue intermittent bolus IV fluid to ensure hydration. -Fall precaution, TeleSitter and soft restraints -Psychiatry reengaged and following.  Bipolar 1 disorder/MDD/GAD/PTSD: Patient remains combative, aggressive and kicking and hitting staff at times even with the adjustment of his psych medications by psychiatry.  Currently requiring restraints.   -Continue Cogentin  1 mg bid, Atarax  50 mg qhs, Ingrezza  80mg  daily, Cymbalta  40 mg bid  -Psychiatry increase Depakote  from 750 mg twice daily to 625 mg 3 times daily.  Depakote  level was normal at 79. -Continue trazodone  and Haldol  as needed. -Palliative and psychiatry following.    Hypotension 2/2 sedation: Resolved. Essential hypertension: Normotensive. -Continue low-dose amlodipine . -Continue home Inderal -decreased to twice daily given borderline heart rate. -Continue holding lisinopril  due to risk for AKI.  NIDDM-2: A1c 6.3%.  CBG within acceptable range.  CBG monitoring discontinued. -Continue low-dose metformin .   Physical deconditioning/recurrent falls  -Therapy recommended SNF.   Hypoxemia: Resolved.  On room air.  Hyperlipidemia - Continue statin.   Vitamin D  deficiency: -Continue vitamin D  supplementation  Dehydration: dry and chapped lips - IV fluid bolus intermittently.  Goal of care: Poor long-term prognosis due to underlying complex mental health  and very limited social support.  He has not demonstrated  capacity to make his own medical decisions so far.  Has no immediate family member.  Reportedly, only one estranged brother that does not want to talk to him. Lived with friend, [name] before this admission. Two person DNR after discussion with palliative medicine on 11/18. -Palliative medicine and psychiatry following. - APS involved for guardianship.  Increased nutrient needs Body mass index is 26.26 kg/m. Nutrition Problem: Increased nutrient needs Etiology: acute illness Signs/Symptoms: estimated needs Interventions: Ensure Enlive (each supplement provides 350kcal and 20 grams of protein), MVI   DVT prophylaxis:  enoxaparin  (LOVENOX ) injection 40 mg Start: 03/08/24 0800  Code Status: DNR. Family Communication: None at bedside Level of care: Med-Surg Status is: Inpatient Remains inpatient appropriate because: SNF bed   Final disposition: SNF   35 minutes with more than 50% spent in reviewing records, counseling patient/family and coordinating care.  Consultants:  Psychiatry Palliative medicine  Procedures: None  Microbiology summarized: MRSA PCR screen positive.  Objective: Vitals:   03/29/24 0926 03/29/24 1409 03/29/24 1953 03/30/24 0422  BP: (!) 131/91 127/79 114/71 128/87  Pulse: 65 61 (!) 59 (!) 58  Resp:  18 17 17   Temp:  98.3 F (36.8 C) 97.7 F (36.5 C) (!) 97.5 F (36.4 C)  TempSrc:  Axillary Axillary Oral  SpO2:  98% 94% 96%  Weight:      Height:        Examination:  GENERAL: No apparent distress.  Nontoxic. HEENT: Chapped and dry lips.  Vision and hearing grossly intact.  NECK: Supple.  No apparent JVD.  RESP:  No IWOB.  On room air. CVS:  RRR. Heart sounds normal.  ABD/GI/GU: BS+. Abd soft, NTND.  MSK/EXT:  Moves extremities.  4 point restraints in place. SKIN: no apparent skin lesion or wound NEURO: Sleepy.  No apparent focal neuro deficit. PSYCH: Currently sleepy.  Sch Meds:  Scheduled Meds:  amLODipine   5 mg Oral Daily    atorvastatin   20 mg Oral Daily   benztropine   1 mg Oral BID   divalproex   625 mg Oral Q8H   DULoxetine   40 mg Oral BID   enoxaparin  (LOVENOX ) injection  40 mg Subcutaneous Q24H   feeding supplement  237 mL Oral BID BM   hydrOXYzine   50 mg Oral QHS   metFORMIN   500 mg Oral BID WC   multivitamin with minerals  1 tablet Oral Daily   propranolol   10 mg Oral BID   valbenazine   80 mg Oral Daily   Continuous Infusions:  lactated ringers  500 mL (03/30/24 1012)   PRN Meds:.acetaminophen  **OR** acetaminophen , artificial tears, benzocaine , bisacodyl , haloperidol  **OR** haloperidol  lactate, lip balm, ondansetron  **OR** ondansetron  (ZOFRAN ) IV, mouth rinse, senna-docusate, traZODone   Antimicrobials: Anti-infectives (From admission, onward)    Start     Dose/Rate Route Frequency Ordered Stop   03/12/24 1515  amoxicillin -clavulanate (AUGMENTIN ) 875-125 MG per tablet 1 tablet        1 tablet Oral Every 12 hours 03/12/24 1420 03/17/24 0959   03/12/24 1515  doxycycline  (VIBRA -TABS) tablet 100 mg  Status:  Discontinued        100 mg Oral Every 12 hours 03/12/24 1420 03/15/24 1100        I have personally reviewed the following labs and images: CBC: Recent Labs  Lab 03/26/24 0436  WBC 7.9  HGB 17.3*  HCT 51.7  MCV 86.2  PLT 176   BMP &GFR Recent Labs  Lab 03/26/24 0436  NA 137  K 3.8  CL 100  CO2 26  GLUCOSE 104*  BUN 13  CREATININE 0.83  CALCIUM  9.7  MG 1.9  PHOS 3.3   Estimated Creatinine Clearance: 98.9 mL/min (by C-G formula based on SCr of 0.83 mg/dL). Liver & Pancreas: Recent Labs  Lab 03/26/24 0436  AST 24  ALT 29  ALKPHOS 78  BILITOT 0.8  PROT 7.2  ALBUMIN  4.0   No results for input(s): LIPASE, AMYLASE in the last 168 hours. No results for input(s): AMMONIA in the last 168 hours. Diabetic: No results for input(s): HGBA1C in the last 72 hours. Recent Labs  Lab 03/24/24 1654 03/24/24 2119 03/25/24 0715 03/25/24 1211 03/28/24 1950  GLUCAP 117*  147* 120* 142* 138*   Cardiac Enzymes: No results for input(s): CKTOTAL, CKMB, CKMBINDEX, TROPONINI in the last 168 hours. No results for input(s): PROBNP in the last 8760 hours. Coagulation Profile: No results for input(s): INR, PROTIME in the last 168 hours. Thyroid  Function Tests: No results for input(s): TSH, T4TOTAL, FREET4, T3FREE, THYROIDAB in the last 72 hours. Lipid Profile: No results for input(s): CHOL, HDL, LDLCALC, TRIG, CHOLHDL, LDLDIRECT in the last 72 hours. Anemia Panel: No results for input(s): VITAMINB12, FOLATE, FERRITIN, TIBC, IRON, RETICCTPCT in the last 72 hours. Urine analysis:    Component Value Date/Time   COLORURINE YELLOW 03/07/2024 2022   APPEARANCEUR CLEAR 03/07/2024 2022   LABSPEC 1.024 03/07/2024 2022   PHURINE 6.0 03/07/2024 2022   GLUCOSEU NEGATIVE 03/07/2024 2022   HGBUR NEGATIVE 03/07/2024 2022   BILIRUBINUR NEGATIVE 03/07/2024 2022   KETONESUR 5 (A) 03/07/2024 2022   PROTEINUR NEGATIVE 03/07/2024 2022   UROBILINOGEN 1.0 10/24/2008 1155   NITRITE NEGATIVE 03/07/2024 2022   LEUKOCYTESUR TRACE (A) 03/07/2024 2022   Sepsis Labs: Invalid input(s): PROCALCITONIN, LACTICIDVEN  Microbiology: No results found for this or any previous visit (from the past 240 hours).   Radiology Studies: No results found.    Carrington Olazabal T. Melady Chow [facility]  If 7PM-7AM, please contact night-coverage [email or web address] 03/30/2024, 12:15 PM

## 2024-03-30 NOTE — Consult Note (Signed)
  Patient is a 58 year old male with a history of Bipolar I disorder, PTSD, MDD, GAD, tardive dyskinesia, type 2 diabetes, hyperlipidemia, chronic tremor, and recurrent falls. He has had an extended and medically complicated hospital course marked by acute metabolic encephalopathy, fluctuating mental status, and persistent behavioral dysregulation. Psychiatry was re-engaged via curbside request by the primary team and palliative medicine to assist with medication recommendations and to help determine whether his current psychiatric symptoms represent a potentially reversible state versus treatment-resistant or "end-stage" psychiatric disease. At this time, the patient remains in four-point restraints due to combative, aggressive, and unpredictable behavior. Review of medication administration shows he has been receiving Haldol  5 mg PO/IV approximately twice daily PRN for the past three days, in addition to scheduled Depakote  DR 750 mg BID, Cymbalta  40 mg BID, Ingrezza  80 mg daily, Cogentin  1 mg BID, and intermittent trazodone  50 mg QHS PRN, which he has not been consistently receiving.  MRI shows only mild cerebral atrophy, mild chronic small-vessel ischemic disease, and a small chronic cerebellar infarct with no acute intracranial pathology, further supporting a medical rather than primary psychiatric etiology. Given this, psychiatry does not recommend aggressive psychiatric medication escalation at this time. Instead, recommend obtaining a Depakote  level to determine whether the current regimen is therapeutic, as subtherapeutic levels may limit mood stabilization and contribute to agitation. Continue Haldol  5 mg PO/IV PRN but limit to lowest effective frequency due to increased risk of oversedation, worsening delirium, and exacerbation of tardive dyskinesia. Recommend holding trazodone  unless needed strictly for sleep, and avoid further anticholinergic burden by limiting additional sedatives. His current  behavioral disturbances appear driven by a multifactorial delirium, not refractory psychiatric disease.  At this time, recommend continued medical stabilization, delirium precautions, frequent reorientation, optimization of sleep-wake cycle, hydration, and review of all iatrogenic contributors. Given ongoing uncertainty regarding clinical trajectory, psychiatry will continue to reassess, but this patient is not yet at a point where his psychiatric illness can be labeled treatment-resistant or end-stage, as his symptoms remain largely medical in origin. If the team wishes for ongoing psychiatric management rather than curbside recommendations, please enter a formal psychiatry consult order so that serial evaluations can continue throughout his hospitalization.  Addendum: VAL 79. Increased to 625mg  po TID. Next VAL will be on 03/31/2024.Will also add  In the meantime encourage ongoing documentation from nursing to support combativeness and agitation. Current documentation from PT/OT shows patient is calm and cooperative. Can always consider adding a scheduled antipsychotic such as Abilify  or Geodon to help manage agitation.   03/30/2024: The client was sitting on the side of the bed today with the sitter and nurse at his sides as they were giving him a break from the restraints.  His nurse communicated to this provider outside of his room as the client became irritable when asking how he was doing.  She reported that he tried to kick PT earlier today and had not tried hitting her today.  His aggression is on and off.  She has found gentle techniques work well with him most of the time.  Haldol  works for his agitation.  It is currently ordered oral or IM, this will be changed to oral or IV to make things easier for him and the staff when he is agitated.

## 2024-03-31 DIAGNOSIS — G9340 Encephalopathy, unspecified: Secondary | ICD-10-CM | POA: Diagnosis not present

## 2024-03-31 DIAGNOSIS — R531 Weakness: Secondary | ICD-10-CM | POA: Diagnosis not present

## 2024-03-31 DIAGNOSIS — R410 Disorientation, unspecified: Secondary | ICD-10-CM | POA: Diagnosis not present

## 2024-03-31 DIAGNOSIS — M545 Low back pain, unspecified: Secondary | ICD-10-CM | POA: Diagnosis not present

## 2024-03-31 LAB — CBC
HCT: 47.9 % (ref 39.0–52.0)
Hemoglobin: 15.8 g/dL (ref 13.0–17.0)
MCH: 28.6 pg (ref 26.0–34.0)
MCHC: 33 g/dL (ref 30.0–36.0)
MCV: 86.6 fL (ref 80.0–100.0)
Platelets: 190 K/uL (ref 150–400)
RBC: 5.53 MIL/uL (ref 4.22–5.81)
RDW: 14.1 % (ref 11.5–15.5)
WBC: 6.4 K/uL (ref 4.0–10.5)
nRBC: 0 % (ref 0.0–0.2)

## 2024-03-31 LAB — RENAL FUNCTION PANEL
Albumin: 3.6 g/dL (ref 3.5–5.0)
Anion gap: 10 (ref 5–15)
BUN: 13 mg/dL (ref 6–20)
CO2: 27 mmol/L (ref 22–32)
Calcium: 9.4 mg/dL (ref 8.9–10.3)
Chloride: 100 mmol/L (ref 98–111)
Creatinine, Ser: 0.96 mg/dL (ref 0.61–1.24)
GFR, Estimated: >60 mL/min (ref >60)
Glucose, Bld: 86 mg/dL (ref 70–99)
Phosphorus: 2.8 mg/dL (ref 2.5–4.6)
Potassium: 3.5 mmol/L (ref 3.5–5.1)
Sodium: 137 mmol/L (ref 135–145)

## 2024-03-31 LAB — CK: Total CK: 58 U/L (ref 49–397)

## 2024-03-31 LAB — MAGNESIUM: Magnesium: 2 mg/dL (ref 1.7–2.4)

## 2024-03-31 NOTE — Progress Notes (Signed)
 PROGRESS NOTE  [name] [name] FMW:[id number] DOB: 1966/01/01   PCP: [name], [name]  Patient is from: Home.  Lives with friend, [name].  No immediate family members.  Only one estranged brother that does not want to talk to him.  DOA: 03/02/2024 LOS: 24  Chief complaints Chief Complaint  Patient presents with   Placement     Brief Narrative / Interim history: 58 year old M with PMH of type I bipolar disorder, tardive dyskinesia, PTSD, MDD, GAD, DM-2, tremor, frequent falls and HLD brought to ED by EMS on 10/18 due to talking crazy.   Per EDP note, roommate/friend reported problems with the patient having frequent falls in the house, difficulty caring for himself, erratic behavior including today where he had a very loud outburst in front of her, calling her names, and then was overheard stating he may kill himself.  She is concerned because he has access to 2 guns in the house.     Patient was boarded in the emergency room since 10/18 10/19, psychiatry consulted as patient was endorsing suicidal thoughts-IVC 10/22, psychiatry cleared for discharge. 10/24, patient was noted to be encephalopathic so admitted to the hospital.  11/4, MRI under sedation, transient hypotension and altered mental status-overnight in ICU. 11/5, clinically improved.  Sedation wore off.  Transferred back to MedSurg bed.    Pending safe discharge planning.   Has not demonstrated capacity to make his own medical decision. Patient has no immediate family members.  APS involved.  Two-physician DNR on 03/27/2024 after discussion with palliative medicine given patient's multiple medical comorbidity.  Psychiatry reengaged to exhaust treatment option.    Subjective: Seen and examined earlier this morning.  No major events overnight or this morning.  Patient wakes to voice and goes back to sleep.  Intermittently responds to question.  Not engaging.   Assessment and plan: Acute metabolic encephalopathy in a  patient with underlying psychiatric disorder: Multifactorial including positive underlying cognitive impairment, dehydration, delirium and iatrogenic.  CT head and MRI brain without acute finding to explain patient's behavioral issue or encephalopathy.  TSH, B12, EEG x 3 unrevealing.  CBC on 10/24 with signs of hemoconcentration.  Low suspicion for infection.  Patient remains combative, aggressive and unpredictable at times.  He is imminent danger to self and staff. -Reorientation and delirium precaution -Continue intermittent bolus IV fluid to ensure hydration. -Fall precaution, TeleSitter and soft restraints -Psychiatry reengaged and following.  Bipolar 1 disorder/MDD/GAD/PTSD: Patient remains combative, aggressive and kicking and hitting staff at times even with the adjustment of his psych medications by psychiatry.  Currently requiring restraints.   -Continue Cogentin  1 mg bid, Atarax  50 mg qhs, Ingrezza  80mg  daily, Cymbalta  40 mg bid  -Psychiatry increase Depakote  from 750 mg twice daily to 625 mg 3 times daily.  Depakote  level was normal at 79. -Continue trazodone  and Haldol  as needed. -Palliative and psychiatry following.   Medical decision making capacity: Oriented to self and place for most part but not time.  Has no insight into his medical condition and treatment options.  Has been intermittently combative, aggressive and unpredictable at times.  Overall, he has not demonstrated a capacity to make his own medical decision during my evaluations.  - TOC requested psychiatry evaluation for capacity to facilitate guardianship.  Psychiatry aware.  Hypotension 2/2 sedation: Resolved. Essential hypertension: Normotensive. -Continue low-dose amlodipine . -Continue home Inderal -decreased to twice daily given borderline heart rate. -Continue holding lisinopril  due to risk for AKI.  NIDDM-2: A1c 6.3%.  CBG within  acceptable range.  CBG monitoring discontinued. -Continue low-dose metformin .    Physical deconditioning/recurrent falls  -Therapy recommended SNF.   Hypoxemia: Resolved.  On room air.  Hyperlipidemia - Continue statin.   Vitamin D  deficiency: -Continue vitamin D  supplementation  Dehydration: dry and chapped lips - IV fluid bolus intermittently.  Goal of care: Poor long-term prognosis due to underlying complex mental health and very limited social support.  He has not demonstrated capacity to make his own medical decisions so far.  Has no immediate family member.  Reportedly, only one estranged brother that does not want to talk to him. Lived with friend, [name] before this admission. Two person DNR after discussion with palliative medicine on 11/18. -Palliative medicine and psychiatry following. - APS involved for guardianship.  Increased nutrient needs Body mass index is 26.26 kg/m. Nutrition Problem: Increased nutrient needs Etiology: acute illness Signs/Symptoms: estimated needs Interventions: Ensure Enlive (each supplement provides 350kcal and 20 grams of protein), MVI   DVT prophylaxis:  enoxaparin  (LOVENOX ) injection 40 mg Start: 03/08/24 0800  Code Status: DNR. Family Communication: None at bedside Level of care: Med-Surg Status is: Inpatient Remains inpatient appropriate because: SNF bed   Final disposition: SNF   35 minutes with more than 50% spent in reviewing records, counseling patient/family and coordinating care.  Consultants:  Psychiatry Palliative medicine  Procedures: None  Microbiology summarized: MRSA PCR screen positive.  Objective: Vitals:   03/30/24 1219 03/30/24 2042 03/31/24 0639 03/31/24 1028  BP: 122/78 124/79 117/83 (!) 140/55  Pulse: 71 69 (!) 52 62  Resp: 18 18 18    Temp: 98.1 F (36.7 C) (!) 97.5 F (36.4 C) (!) 97.5 F (36.4 C)   TempSrc:  Oral Oral   SpO2: 95% 96% 98%   Weight:      Height:        Examination:  GENERAL: No apparent distress.  Nontoxic. HEENT: Chapped and dry lips.  Vision and  hearing grossly intact.  NECK: Supple.  No apparent JVD.  RESP:  No IWOB.  On room air. CVS:  RRR. Heart sounds normal.  ABD/GI/GU: BS+. Abd soft, NTND.  MSK/EXT:  Moves extremities.  4 point restraints in place. SKIN: no apparent skin lesion or wound NEURO: Sleepy.  No apparent focal neuro deficit. PSYCH: Currently sleepy.  Sch Meds:  Scheduled Meds:  amLODipine   5 mg Oral Daily   atorvastatin   20 mg Oral Daily   benztropine   1 mg Oral BID   divalproex   625 mg Oral Q8H   DULoxetine   40 mg Oral BID   enoxaparin  (LOVENOX ) injection  40 mg Subcutaneous Q24H   feeding supplement  237 mL Oral BID BM   hydrOXYzine   50 mg Oral QHS   metFORMIN   500 mg Oral BID WC   multivitamin with minerals  1 tablet Oral Daily   propranolol   10 mg Oral BID   valbenazine   80 mg Oral Daily   Continuous Infusions:  lactated ringers  500 mL (03/31/24 1048)   PRN Meds:.acetaminophen  **OR** acetaminophen , artificial tears, benzocaine , bisacodyl , haloperidol  **OR** haloperidol  lactate, lip balm, ondansetron  **OR** ondansetron  (ZOFRAN ) IV, mouth rinse, senna-docusate, traZODone   Antimicrobials: Anti-infectives (From admission, onward)    Start     Dose/Rate Route Frequency Ordered Stop   03/12/24 1515  amoxicillin -clavulanate (AUGMENTIN ) 875-125 MG per tablet 1 tablet        1 tablet Oral Every 12 hours 03/12/24 1420 03/17/24 0959   03/12/24 1515  doxycycline  (VIBRA -TABS) tablet 100 mg  Status:  Discontinued  100 mg Oral Every 12 hours 03/12/24 1420 03/15/24 1100        I have personally reviewed the following labs and images: CBC: Recent Labs  Lab 03/26/24 0436 03/31/24 0504  WBC 7.9 6.4  HGB 17.3* 15.8  HCT 51.7 47.9  MCV 86.2 86.6  PLT 176 190   BMP &GFR Recent Labs  Lab 03/26/24 0436 03/31/24 0504  NA 137 137  K 3.8 3.5  CL 100 100  CO2 26 27  GLUCOSE 104* 86  BUN 13 13  CREATININE 0.83 0.96  CALCIUM  9.7 9.4  MG 1.9 2.0  PHOS 3.3 2.8   Estimated Creatinine Clearance:  85.5 mL/min (by C-G formula based on SCr of 0.96 mg/dL). Liver & Pancreas: Recent Labs  Lab 03/26/24 0436 03/31/24 0504  AST 24  --   ALT 29  --   ALKPHOS 78  --   BILITOT 0.8  --   PROT 7.2  --   ALBUMIN  4.0 3.6   No results for input(s): LIPASE, AMYLASE in the last 168 hours. No results for input(s): AMMONIA in the last 168 hours. Diabetic: No results for input(s): HGBA1C in the last 72 hours. Recent Labs  Lab 03/24/24 1654 03/24/24 2119 03/25/24 0715 03/25/24 1211 03/28/24 1950  GLUCAP 117* 147* 120* 142* 138*   Cardiac Enzymes: Recent Labs  Lab 03/31/24 0504  CKTOTAL 58   No results for input(s): PROBNP in the last 8760 hours. Coagulation Profile: No results for input(s): INR, PROTIME in the last 168 hours. Thyroid  Function Tests: No results for input(s): TSH, T4TOTAL, FREET4, T3FREE, THYROIDAB in the last 72 hours. Lipid Profile: No results for input(s): CHOL, HDL, LDLCALC, TRIG, CHOLHDL, LDLDIRECT in the last 72 hours. Anemia Panel: No results for input(s): VITAMINB12, FOLATE, FERRITIN, TIBC, IRON, RETICCTPCT in the last 72 hours. Urine analysis:    Component Value Date/Time   COLORURINE YELLOW 03/07/2024 2022   APPEARANCEUR CLEAR 03/07/2024 2022   LABSPEC 1.024 03/07/2024 2022   PHURINE 6.0 03/07/2024 2022   GLUCOSEU NEGATIVE 03/07/2024 2022   HGBUR NEGATIVE 03/07/2024 2022   BILIRUBINUR NEGATIVE 03/07/2024 2022   KETONESUR 5 (A) 03/07/2024 2022   PROTEINUR NEGATIVE 03/07/2024 2022   UROBILINOGEN 1.0 10/24/2008 1155   NITRITE NEGATIVE 03/07/2024 2022   LEUKOCYTESUR TRACE (A) 03/07/2024 2022   Sepsis Labs: Invalid input(s): PROCALCITONIN, LACTICIDVEN  Microbiology: No results found for this or any previous visit (from the past 240 hours).   Radiology Studies: No results found.    Myeisha Kruser T. Yussef Jorge [facility]  If 7PM-7AM, please contact night-coverage [email or web address] 03/31/2024, 11:42  AM

## 2024-03-31 NOTE — Plan of Care (Signed)
  Problem: Health Behavior/Discharge Planning: Goal: Ability to manage health-related needs will improve Outcome: Progressing   Problem: Clinical Measurements: Goal: Cardiovascular complication will be avoided Outcome: Progressing   Problem: Clinical Measurements: Goal: Respiratory complications will improve Outcome: Progressing

## 2024-03-31 NOTE — Plan of Care (Signed)
  Problem: Health Behavior/Discharge Planning: Goal: Ability to manage health-related needs will improve Outcome: Progressing   Problem: Clinical Measurements: Goal: Ability to maintain clinical measurements within normal limits will improve Outcome: Progressing Goal: Will remain free from infection Outcome: Progressing Goal: Diagnostic test results will improve Outcome: Progressing Goal: Respiratory complications will improve Outcome: Progressing Goal: Cardiovascular complication will be avoided Outcome: Progressing   Problem: Activity: Goal: Risk for activity intolerance will decrease Outcome: Progressing   Problem: Nutrition: Goal: Adequate nutrition will be maintained Outcome: Progressing   Problem: Coping: Goal: Level of anxiety will decrease Outcome: Progressing   Problem: Elimination: Goal: Will not experience complications related to bowel motility Outcome: Progressing Goal: Will not experience complications related to urinary retention Outcome: Progressing   Problem: Pain Managment: Goal: General experience of comfort will improve and/or be controlled Outcome: Progressing   Problem: Safety: Goal: Ability to remain free from injury will improve Outcome: Progressing   Problem: Skin Integrity: Goal: Risk for impaired skin integrity will decrease Outcome: Progressing   Problem: Education: Goal: Ability to describe self-care measures that may prevent or decrease complications (Diabetes Survival Skills Education) will improve Outcome: Progressing Goal: Individualized Educational Video(s) Outcome: Progressing   Problem: Coping: Goal: Ability to adjust to condition or change in health will improve Outcome: Progressing   Problem: Fluid Volume: Goal: Ability to maintain a balanced intake and output will improve Outcome: Progressing   Problem: Health Behavior/Discharge Planning: Goal: Ability to identify and utilize available resources and services will  improve Outcome: Progressing Goal: Ability to manage health-related needs will improve Outcome: Progressing   Problem: Metabolic: Goal: Ability to maintain appropriate glucose levels will improve Outcome: Progressing   Problem: Nutritional: Goal: Maintenance of adequate nutrition will improve Outcome: Progressing Goal: Progress toward achieving an optimal weight will improve Outcome: Progressing   Problem: Skin Integrity: Goal: Risk for impaired skin integrity will decrease Outcome: Progressing   Problem: Tissue Perfusion: Goal: Adequacy of tissue perfusion will improve Outcome: Progressing   Problem: Safety: Goal: Non-violent Restraint(s) Outcome: Progressing

## 2024-03-31 NOTE — Consult Note (Signed)
 Patient was resting quietly.  The RN and tech stated there were no complaints of behavior issues over night or sleep issues noted.  He declined breakfast today and calm when hand restraints removed.  No changes made at this time to his medication regiment.  Sharlot Becker, PMHNP

## 2024-04-01 DIAGNOSIS — E1165 Type 2 diabetes mellitus with hyperglycemia: Secondary | ICD-10-CM

## 2024-04-01 DIAGNOSIS — G9340 Encephalopathy, unspecified: Secondary | ICD-10-CM | POA: Diagnosis not present

## 2024-04-01 DIAGNOSIS — G2401 Drug induced subacute dyskinesia: Secondary | ICD-10-CM | POA: Diagnosis not present

## 2024-04-01 DIAGNOSIS — E782 Mixed hyperlipidemia: Secondary | ICD-10-CM | POA: Diagnosis not present

## 2024-04-01 DIAGNOSIS — M545 Low back pain, unspecified: Secondary | ICD-10-CM | POA: Diagnosis not present

## 2024-04-01 NOTE — Plan of Care (Signed)

## 2024-04-01 NOTE — Progress Notes (Signed)
 [facility]                                                                              [name] [name], is a 58 y.o. male, DOB - 1965/11/19, FMW:[id number] Admit date - 03/02/2024    Outpatient Primary MD for the patient is [name], [name]  LOS - 25  days  Chief Complaint  Patient presents with   Placement       Brief summary   Patient is a 58 year old male with bipolar disorder type I, tardive dyskinesia, PTSD, MDD, GAD, DM-2, tremor, frequent falls and HLD brought to ED by EMS on 10/18 due to talking crazy.    Per EDP note, roommate/friend reported problems with the patient having frequent falls in the house, difficulty caring for himself, erratic behavior including today where he had a very loud outburst in front of her, calling her names, and then was overheard stating he may kill himself.  She is concerned because he has access to 2 guns in the house.    Patient was boarded in the emergency room since 10/18 10/19, psychiatry consulted as patient was endorsing suicidal thoughts-IVC 10/22, psychiatry cleared for discharge. 10/24, patient was noted to be encephalopathic so admitted to the hospital.  11/4, MRI under sedation, transient hypotension and altered mental status-overnight in ICU. 11/5, clinically improved.  Sedation wore off.  Transferred back to MedSurg bed.     Pending safe discharge planning.   Has not demonstrated capacity to make his own medical decision. Patient has no immediate family members.  APS involved.  Two-physician DNR on 03/27/2024 after discussion with palliative medicine given patient's multiple medical comorbidity.  Psychiatry reengaged to exhaust treatment option.    Assessment & Plan    Acute metabolic encephalopathy in a patient with underlying psychiatric disorder:  - Multifactorial including positive underlying cognitive impairment, dehydration, delirium and iatrogenic.   - CT head and MRI brain without acute finding  to explain patient's behavioral issue or encephalopathy. -- TSH, B12, EEG x 3 unrevealing. Low suspicion for infection.  - remains combative, aggressive and unpredictable at times.  He is imminent danger to self and staff. - Continue delirium precautions, fall precautions, restraints, psychiatry consulted and following.  Bipolar 1 disorder/MDD/GAD/PTSD:  - remains combative, aggressive and kicking and hitting staff at times even with the adjustment of his psych medications by psychiatry.  Currently requiring restraints.   -Continue Cogentin  1 mg bid, Atarax  50 mg qhs, Ingrezza  80mg  daily, Cymbalta  40 mg bid  -Psychiatry increase Depakote  from 750 mg twice daily to 625 mg 3 times daily.  Depakote  level was normal at 79. -Continue trazodone  and Haldol  as needed. -Palliative and psychiatry following.     Medical decision making capacity: -  Oriented to self and place for most part but not time.  Has no insight into his medical condition and treatment options.  Has been intermittently combative, aggressive and unpredictable at times.  Overall, he has not demonstrated a capacity to make his own medical decision during my evaluations.  - TOC requested psychiatry evaluation for capacity to facilitate guardianship.  Psychiatry aware.   Hypotension 2/2  sedation: Resolved. Essential hypertension: Normotensive. -Continue amlodipine  5 mg daily, will decrease if BP remains borderline.   -Continue home Inderal -decreased to twice daily given borderline heart rate. -Continue holding lisinopril  due to risk for AKI.   NIDDM-2:  A1c 6.3%.  CBG within acceptable range.  -Continue low-dose metformin .   Physical deconditioning/recurrent falls  -Therapy recommended SNF.   Hypoxemia:  - Resolved.  On room air.   Hyperlipidemia - Continue statin.   Vitamin D  deficiency: -Continue vitamin D  supplementation   Dehydration: dry and chapped lips - IV fluid bolus intermittently.   Goal of care:  - Poor  long-term prognosis due to underlying complex mental health and very limited social support.  He has not demonstrated capacity to make his own medical decisions so far.  Has no immediate family member support.  Reportedly, only one estranged brother that does not want to talk to him. Lived with friend, [name] before this admission. Two person DNR after discussion with palliative medicine on 11/18. -Palliative medicine and psychiatry following. - APS involved for guardianship.   Mild to moderate protein calorie malnutrition Nutrition Problem: Increased nutrient needs Etiology: acute illness Signs/Symptoms: estimated needs  Interventions: Ensure Enlive (each supplement provides 350kcal and 20 grams of protein), MVI  Estimated body mass index is 26.26 kg/m as calculated from the following:   Height as of this encounter: 5' 10 (1.778 m).   Weight as of this encounter: 83 kg.  Code Status: DNR DVT Prophylaxis:  enoxaparin  (LOVENOX ) injection 40 mg Start: 03/08/24 0800   Level of Care: Level of care: Med-Surg Family Communication: Disposition Plan:      Remains inpatient appropriate: Awaiting placement   Procedures:    Consultants:     Antimicrobials:   Anti-infectives (From admission, onward)    Start     Dose/Rate Route Frequency Ordered Stop   03/12/24 1515  amoxicillin -clavulanate (AUGMENTIN ) 875-125 MG per tablet 1 tablet        1 tablet Oral Every 12 hours 03/12/24 1420 03/17/24 0959   03/12/24 1515  doxycycline  (VIBRA -TABS) tablet 100 mg  Status:  Discontinued        100 mg Oral Every 12 hours 03/12/24 1420 03/15/24 1100          Medications  amLODipine   5 mg Oral Daily   atorvastatin   20 mg Oral Daily   benztropine   1 mg Oral BID   divalproex   625 mg Oral Q8H   DULoxetine   40 mg Oral BID   enoxaparin  (LOVENOX ) injection  40 mg Subcutaneous Q24H   feeding supplement  237 mL Oral BID BM   hydrOXYzine   50 mg Oral QHS   metFORMIN   500 mg Oral BID WC    multivitamin with minerals  1 tablet Oral Daily   propranolol   10 mg Oral BID   valbenazine   80 mg Oral Daily      Subjective:   [name] [name] was seen and examined today.  No acute issues.  No ongoing nausea vomiting, abdominal pain, fever or chills.  Objective:   Vitals:   03/31/24 1028 03/31/24 1420 03/31/24 1925 04/01/24 0508  BP: (!) 140/55 114/74 122/77 106/78  Pulse: 62 61 65 (!) 56  Resp:  18 18 18   Temp:  98.1 F (36.7 C) 97.6 F (36.4 C) (!) 97.5 F (36.4 C)  TempSrc:  Oral Oral Oral  SpO2:  98% 96% 96%  Weight:      Height:        Intake/Output Summary (Last  24 hours) at 04/01/2024 1205 Last data filed at 04/01/2024 0506 Gross per 24 hour  Intake 1100 ml  Output 350 ml  Net 750 ml     Wt Readings from Last 3 Encounters:  03/26/24 83 kg  02/25/24 93 kg  01/26/24 93 kg     Exam General: oriented to self, NAD, somewhat sleepy Cardiovascular: S1 S2 auscultated,  RRR Respiratory: Clear to auscultation bilaterally, no wheezing Gastrointestinal: Soft, nontender, nondistended, + bowel sounds Ext: no pedal edema bilaterally Neuro: Moving all 4 extremities  Psych: Appears confused and sleepy    Data Reviewed:  I have personally reviewed following labs    CBC Lab Results  Component Value Date   WBC 6.4 03/31/2024   RBC 5.53 03/31/2024   HGB 15.8 03/31/2024   HCT 47.9 03/31/2024   MCV 86.6 03/31/2024   MCH 28.6 03/31/2024   PLT 190 03/31/2024   MCHC 33.0 03/31/2024   RDW 14.1 03/31/2024   LYMPHSABS 2.7 03/07/2024   MONOABS 0.8 03/07/2024   EOSABS 0.1 03/07/2024   BASOSABS 0.1 03/07/2024     Last metabolic panel Lab Results  Component Value Date   NA 137 03/31/2024   K 3.5 03/31/2024   CL 100 03/31/2024   CO2 27 03/31/2024   BUN 13 03/31/2024   CREATININE 0.96 03/31/2024   GLUCOSE 86 03/31/2024   GFRNONAA >60 03/31/2024   GFRAA >60 12/26/2019   CALCIUM  9.4 03/31/2024   PHOS 2.8 03/31/2024   PROT 7.2 03/26/2024   ALBUMIN  3.6  03/31/2024   LABGLOB 2.4 09/01/2022   AGRATIO 1.8 09/01/2022   BILITOT 0.8 03/26/2024   ALKPHOS 78 03/26/2024   AST 24 03/26/2024   ALT 29 03/26/2024   ANIONGAP 10 03/31/2024    CBG (last 3)  No results for input(s): GLUCAP in the last 72 hours.    Coagulation Profile: No results for input(s): INR, PROTIME in the last 168 hours.   Radiology Studies: I have personally reviewed the imaging studies  No results found.     Nydia Distance M.D. [facility] 04/01/2024, 12:05 PM  Available via Epic secure chat 7am-7pm After 7 pm, please refer to night coverage provider listed on amion.

## 2024-04-01 NOTE — Plan of Care (Signed)
  Problem: Health Behavior/Discharge Planning: Goal: Ability to manage health-related needs will improve Outcome: Progressing   Problem: Clinical Measurements: Goal: Ability to maintain clinical measurements within normal limits will improve Outcome: Progressing Goal: Will remain free from infection Outcome: Progressing Goal: Diagnostic test results will improve Outcome: Progressing Goal: Respiratory complications will improve Outcome: Progressing Goal: Cardiovascular complication will be avoided Outcome: Progressing   Problem: Activity: Goal: Risk for activity intolerance will decrease Outcome: Progressing   Problem: Nutrition: Goal: Adequate nutrition will be maintained Outcome: Progressing   Problem: Coping: Goal: Level of anxiety will decrease Outcome: Progressing   Problem: Elimination: Goal: Will not experience complications related to bowel motility Outcome: Progressing Goal: Will not experience complications related to urinary retention Outcome: Progressing   Problem: Pain Managment: Goal: General experience of comfort will improve and/or be controlled Outcome: Progressing   Problem: Safety: Goal: Ability to remain free from injury will improve Outcome: Progressing   Problem: Skin Integrity: Goal: Risk for impaired skin integrity will decrease Outcome: Progressing   Problem: Education: Goal: Ability to describe self-care measures that may prevent or decrease complications (Diabetes Survival Skills Education) will improve Outcome: Progressing Goal: Individualized Educational Video(s) Outcome: Progressing   Problem: Coping: Goal: Ability to adjust to condition or change in health will improve Outcome: Progressing   Problem: Fluid Volume: Goal: Ability to maintain a balanced intake and output will improve Outcome: Progressing   Problem: Health Behavior/Discharge Planning: Goal: Ability to identify and utilize available resources and services will  improve Outcome: Progressing Goal: Ability to manage health-related needs will improve Outcome: Progressing   Problem: Metabolic: Goal: Ability to maintain appropriate glucose levels will improve Outcome: Progressing   Problem: Nutritional: Goal: Maintenance of adequate nutrition will improve Outcome: Progressing Goal: Progress toward achieving an optimal weight will improve Outcome: Progressing   Problem: Skin Integrity: Goal: Risk for impaired skin integrity will decrease Outcome: Progressing   Problem: Tissue Perfusion: Goal: Adequacy of tissue perfusion will improve Outcome: Progressing   Problem: Safety: Goal: Non-violent Restraint(s) Outcome: Completed/Met

## 2024-04-01 NOTE — Consult Note (Signed)
 Consult for capacity per Dr. Gonfa as they needed psychiatry to complete it as TOC needed one and his was not sufficient:  I was told mine was not adequate per Dr. Kathrin on 11/23 at 11:42 am.  This provider confirms the patient does not have capacity at this time as he does not have the ability to understand the benefits and risks of, and the alternatives to, a proposed treatment or intervention (including no treatment).  The client cannot explain his condition besides he fell.  He cannot discuss his other medical disorders or treatment options.  Kohl was not able to have a conversation about his condition or treatment.  No reasoning or rationale provided on his part.  He was not even able to say what he would do if he fell or became very sick at home.  During the assessment, he quickly angered and would yell What! At times.  The RN staff reports his behaviors are unpredictable with sudden kicking or hitting staff at times, including PT.  He does not have capacity to make medical decisions at this time.  Sharlot Becker, PMHNP

## 2024-04-02 DIAGNOSIS — M545 Low back pain, unspecified: Secondary | ICD-10-CM | POA: Diagnosis not present

## 2024-04-02 DIAGNOSIS — E782 Mixed hyperlipidemia: Secondary | ICD-10-CM | POA: Diagnosis not present

## 2024-04-02 DIAGNOSIS — G2401 Drug induced subacute dyskinesia: Secondary | ICD-10-CM | POA: Diagnosis not present

## 2024-04-02 DIAGNOSIS — G9340 Encephalopathy, unspecified: Secondary | ICD-10-CM | POA: Diagnosis not present

## 2024-04-02 NOTE — Progress Notes (Signed)
  Daily Progress Note   Patient Name: [name] [name]       Date: 04/02/2024 DOB: Mar 01, 1966  Age: 58 y.o. MRN#: [id number] Attending Physician: [name] Nydia POUR, MD Primary Care Physician: [name] Jeoffrey [name], [name] Admit Date: 03/02/2024 Length of Stay: 26 days  Reason for Consultation/Follow-up: Establishing goals of care  Subjective:   Resting in bed Deemed to lack capacity as per latest Psychiatry consultation.   Objective:   Vital Signs:  BP 105/74 (BP Location: Right Arm)   Pulse 69   Temp (!) 97.5 F (36.4 C) (Oral)   Resp 16   Ht 5' 10 (1.778 m)   Wt 83 kg   SpO2 96%   BMI 26.26 kg/m   Physical Exam: General: Awake Cardiovascular: RRR Respiratory: no increased work of breathing noted, not in respiratory distress Abdomen: not distended Neuro: Awake  Assessment & Plan:   Assessment: Patient is a 58 year old male with a past medical history of type I bipolar disorder, tardive dyskinesia, PTSD, MDD, GAD, tremor, frequent falls, and hyperlipidemia who was brought to the ED on 02/25/2023 by EMS for talking crazy.  Patient's roommate/friend reported patient was having frequent falls, erratic behavior, and difficulty caring for himself at home; roommate had also overheard patient stating that he may kill himself which was concerning since he has access to 2 guns in the house.  Initially patient was endorsing suicidal thoughts and IVC was obtained by psychiatry though psychiatry cleared for discharge on 02/29/2024.  Patient then noted to be encephalopathic and so was admitted to the hospital on 03/02/2024.  Hospitalization further complicated by hypotension requiring ICU admission though has currently resolved.  TOC involved to seek guardianship for patient as no family or friends willing to make decisions on patient's behalf.  Palliative medicine team consulted to assist with complex medical decision making.   Recommendations/Plan: # Complex medical decision making/goals of  care:   - Patient unable to participate in complex medical decision making due to underlying medical status.  Patient does not have friends or family willing to make decisions on his behalf.   Psychiatry reinvolved and patient deemed to lack capacity at present.  Agree with seeking guardianship, TOC following.  My colleague Dr [name] [name] has previously discussed with hospitalist about two-physician DNR based on patient's multiple medical comorbidities.  Continuing medically appropriate and reasonable medical interventions at this time and these are being done according to excepted practice and standards of care.  In the event of cardiac or respiratory arrest, an attempt at resuscitation would be unsuccessful in returning this patient to an already fragile state of health and cause unnecessary suffering in a patient with these multiple underlying medical comorbidities. Support placing a maintaining a DNR order and support the care plan as outlined by the primary service.   # Psycho-social/Spiritual Support:  - TOC seeking guardianship/working with DSS   # Discharge Planning:  To Be Determined  Thank you for allowing the palliative care team to participate in the care [name] [name].  Mod MDM [name] Serve MD.  Palliative Care Provider PMT # [phone]  If patient remains symptomatic despite maximum doses, please call PMT at [phone] between 0700 and 1900. Outside of these hours, please call attending, as PMT does not have night coverage.

## 2024-04-02 NOTE — Progress Notes (Signed)
 Physical Therapy Treatment Patient Details Name: [name] [name] MRN: [id number] DOB: August 29, 1965 Today's Date: 04/02/2024   History of Present Illness Pt is a 58 y/o male presenting on 10/24 for fall and chronic back pain. 10/19 psychiatry consulted due to suicidal thoughts, cleared on 10/22. Change in mental status due to acute delirium, and admitted from ED with acute metabolic encephalopathy. CT negative, EEG negative, MRI pending, chest x ray with hypoventilation. PMH includes: bipolar disorder type 1, tardive dyskinesia, PTSD, MDD, CAD, DM2, OA, tremors.    PT Comments  Good progress with mobility today, pt ambulated 200' with RW, no loss of balance. Min assist for bed mobility. Pt reports chronic back pain which he stated doesn't change based upon activity. Assisted pt with cleanup following use of bedside commode where he had a BM. Good participation today.    If plan is discharge home, recommend the following: Assistance with cooking/housework;Assist for transportation;Help with stairs or ramp for entrance;Direct supervision/assist for medications management;A little help with walking and/or transfers;A little help with bathing/dressing/bathroom;Direct supervision/assist for financial management   Can travel by private vehicle     Yes  Equipment Recommendations  None recommended by PT    Recommendations for Other Services       Precautions / Restrictions Precautions Precautions: Fall Recall of Precautions/Restrictions: Impaired Restrictions Other Position/Activity Restrictions: chronic back pain, history of Parkinson's disease     Mobility  Bed Mobility Overal bed mobility: Needs Assistance Bed Mobility: Supine to Sit     Supine to sit: Min assist Sit to supine: Supervision   General bed mobility comments: assist to raise trunk    Transfers Overall transfer level: Needs assistance Equipment used: Rolling walker (2 wheels) Transfers: Sit to/from Stand Sit to  Stand: Min assist           General transfer comment: VCs hand placement    Ambulation/Gait Ambulation/Gait assistance: Supervision Gait Distance (Feet): 200 Feet Assistive device: Rolling walker (2 wheels) Gait Pattern/deviations: Step-through pattern, Decreased stride length, Narrow base of support Gait velocity: decreased     General Gait Details: steady, no loss of balance   Stairs             Wheelchair Mobility     Tilt Bed    Modified Rankin (Stroke Patients Only)       Balance   Sitting-balance support: Feet supported Sitting balance-Leahy Scale: Good Sitting balance - Comments: static sitting-good. dynamic sitting-fair+   Standing balance support: Bilateral upper extremity supported, During functional activity, Reliant on assistive device for balance Standing balance-Leahy Scale: Fair                              Hotel Manager: Impaired Factors Affecting Communication: Reduced clarity of speech  Cognition Arousal: Alert Behavior During Therapy: WFL for tasks assessed/performed, Flat affect   PT - Cognitive impairments: No apparent impairments                         Following commands: Impaired Following commands impaired: Follows one step commands with increased time    Cueing Cueing Techniques: Verbal cues, Gestural cues  Exercises      General Comments        Pertinent Vitals/Pain Pain Assessment Faces Pain Scale: Hurts little more Pain Location: chronic back pain Pain Descriptors / Indicators: Discomfort, Grimacing Pain Intervention(s): Limited activity within patient's tolerance, Monitored during session  Home Living                          Prior Function            PT Goals (current goals can now be found in the care plan section) Acute Rehab PT Goals Patient Stated Goal: Decreased back pain; no more falls PT Goal Formulation: With patient Time For Goal  Achievement: 04/16/24 Potential to Achieve Goals: Fair Progress towards PT goals: Progressing toward goals    Frequency    Min 1X/week      PT Plan      Co-evaluation              AM-PAC PT 6 Clicks Mobility   Outcome Measure  Help needed turning from your back to your side while in a flat bed without using bedrails?: A Little Help needed moving from lying on your back to sitting on the side of a flat bed without using bedrails?: A Little Help needed moving to and from a bed to a chair (including a wheelchair)?: A Little Help needed standing up from a chair using your arms (e.g., wheelchair or bedside chair)?: A Little Help needed to walk in hospital room?: A Little Help needed climbing 3-5 steps with a railing? : A Lot 6 Click Score: 17    End of Session Equipment Utilized During Treatment: Gait belt Activity Tolerance: Patient tolerated treatment well Patient left: in bed;with bed alarm set;with call bell/phone within reach Nurse Communication: Mobility status PT Visit Diagnosis: History of falling (Z91.81);Unsteadiness on feet (R26.81);Other abnormalities of gait and mobility (R26.89);Muscle weakness (generalized) (M62.81);Repeated falls (R29.6);Pain     Time: 9044-8988 PT Time Calculation (min) (ACUTE ONLY): 16 min  Charges:    $Gait Training: 8-22 mins PT General Charges $$ ACUTE PT VISIT: 1 Visit                     [name] [name] PT 04/02/2024  Acute Rehabilitation Services  Office [phone]

## 2024-04-02 NOTE — Plan of Care (Signed)

## 2024-04-02 NOTE — Progress Notes (Signed)
 [facility]                                                                              [name] [name], is a 58 y.o. male, DOB - 18-Nov-1965, FMW:[id number] Admit date - 03/02/2024    Outpatient Primary MD for the patient is [name], [name]  LOS - 26  days  Chief Complaint  Patient presents with   Placement       Brief summary   Patient is a 58 year old male with bipolar disorder type I, tardive dyskinesia, PTSD, MDD, GAD, DM-2, tremor, frequent falls and HLD brought to ED by EMS on 10/18 due to talking crazy.    Per EDP note, roommate/friend reported problems with the patient having frequent falls in the house, difficulty caring for himself, erratic behavior including today where he had a very loud outburst in front of her, calling her names, and then was overheard stating he may kill himself.  She is concerned because he has access to 2 guns in the house.    Patient was boarded in the emergency room since 10/18 10/19, psychiatry consulted as patient was endorsing suicidal thoughts-IVC 10/22, psychiatry cleared for discharge. 10/24, patient was noted to be encephalopathic so admitted to the hospital.  11/4, MRI under sedation, transient hypotension and altered mental status-overnight in ICU. 11/5, clinically improved.  Sedation wore off.  Transferred back to MedSurg bed.     Pending safe discharge planning.   Has not demonstrated capacity to make his own medical decision. Patient has no immediate family members.  APS involved.  Two-physician DNR on 03/27/2024 after discussion with palliative medicine given patient's multiple medical comorbidity.  Psychiatry reengaged to exhaust treatment option.   Awaiting placement  Assessment & Plan    Acute metabolic encephalopathy in a patient with underlying psychiatric disorder:  - Multifactorial including positive underlying cognitive impairment, dehydration, delirium and iatrogenic.   - CT head and MRI brain  without acute finding to explain patient's behavioral issue or encephalopathy. -- TSH, B12, EEG x 3 unrevealing. Low suspicion for infection.  - remains combative, aggressive and unpredictable at times.  He is imminent danger to self and staff. - Continue delirium precautions, fall precautions, restraints, psychiatry following.  Bipolar 1 disorder/MDD/GAD/PTSD:  - remains combative, aggressive and kicking and hitting staff at times even with the adjustment of his psych medications by psychiatry.  Currently requiring restraints.   -Continue Cogentin  1 mg bid, Atarax  50 mg qhs, Ingrezza  80mg  daily, Cymbalta  40 mg bid  -Psychiatry increase Depakote  from 750 mg twice daily to 625 mg 3 times daily.  Depakote  level was normal at 79. -Continue trazodone  and Haldol  as needed. -Palliative and psychiatry following.     Medical decision making capacity: -  Oriented to self and place for most part but not time.  Has no insight into his medical condition and treatment options.  Has been intermittently combative, aggressive and unpredictable at times.  Overall, he has not demonstrated a capacity to make his own medical decision during my evaluations.  - Per psychiatry evaluation, on 11/23, patient does not have capacity at this time for medical decisions.  Hypotension 2/2 sedation: Resolved. Essential hypertension: Normotensive. - BP stable, continue amlodipine  5 mg daily, propranolol  10 mg twice daily  -Continue holding lisinopril  due to risk for AKI.   Diabetes mellitus type 2, NIDDM  A1c 6.3%.  CBG within acceptable range.  -Continue low-dose metformin .   Physical deconditioning/recurrent falls  -Therapy recommended SNF.   Hypoxemia:  - Resolved.  On room air.   Hyperlipidemia - Continue statin.   Vitamin D  deficiency: -Continue vitamin D  supplementation   Dehydration: dry and chapped lips - IV fluid bolus intermittently.   Goal of care:  - Poor long-term prognosis due to underlying  complex mental health and very limited social support.  He has not demonstrated capacity to make his own medical decisions so far.  Has no immediate family member support.  Reportedly, only one estranged brother that does not want to talk to him. Lived with friend, [name] before this admission. Two person DNR after discussion with palliative medicine on 11/18. -Palliative medicine and psychiatry following. - APS involved for guardianship.   Mild to moderate protein calorie malnutrition Nutrition Problem: Increased nutrient needs Etiology: acute illness Signs/Symptoms: estimated needs  Interventions: Ensure Enlive (each supplement provides 350kcal and 20 grams of protein), MVI  Estimated body mass index is 26.26 kg/m as calculated from the following:   Height as of this encounter: 5' 10 (1.778 m).   Weight as of this encounter: 83 kg.  Code Status: DNR DVT Prophylaxis:  enoxaparin  (LOVENOX ) injection 40 mg Start: 03/08/24 0800   Level of Care: Level of care: Med-Surg Family Communication: Disposition Plan:      Remains inpatient appropriate: Awaiting placement   Procedures:    Consultants:     Antimicrobials:   Anti-infectives (From admission, onward)    Start     Dose/Rate Route Frequency Ordered Stop   03/12/24 1515  amoxicillin -clavulanate (AUGMENTIN ) 875-125 MG per tablet 1 tablet        1 tablet Oral Every 12 hours 03/12/24 1420 03/17/24 0959   03/12/24 1515  doxycycline  (VIBRA -TABS) tablet 100 mg  Status:  Discontinued        100 mg Oral Every 12 hours 03/12/24 1420 03/15/24 1100          Medications  amLODipine   5 mg Oral Daily   atorvastatin   20 mg Oral Daily   benztropine   1 mg Oral BID   divalproex   625 mg Oral Q8H   DULoxetine   40 mg Oral BID   enoxaparin  (LOVENOX ) injection  40 mg Subcutaneous Q24H   feeding supplement  237 mL Oral BID BM   hydrOXYzine   50 mg Oral QHS   metFORMIN   500 mg Oral BID WC   multivitamin with minerals  1 tablet Oral Daily    propranolol   10 mg Oral BID   valbenazine   80 mg Oral Daily      Subjective:   [name] [name] was seen and examined today.  Resting comfortably, no acute issues.   Objective:   Vitals:   04/01/24 1950 04/01/24 2025 04/02/24 0445 04/02/24 1056  BP: 105/73 104/73 102/69 120/86  Pulse: (!) 58 (!) 59 70 85  Resp: 16 16 16 18   Temp: (!) 97.3 F (36.3 C) 97.8 F (36.6 C) 98 F (36.7 C)   TempSrc:  Oral    SpO2: 100% 100% 94%   Weight:      Height:        Intake/Output Summary (Last 24 hours) at 04/02/2024 1154 Last data filed at 04/02/2024  0500 Gross per 24 hour  Intake 240 ml  Output 800 ml  Net -560 ml     Wt Readings from Last 3 Encounters:  03/26/24 83 kg  02/25/24 93 kg  01/26/24 93 kg   Physical Exam General: Somnolent but arousable. Cardiovascular: S1 S2 clear, RRR.  Respiratory: CTAB, no wheezing Gastrointestinal: Soft, nontender, nondistended, NBS Ext: no pedal edema bilaterally Psych: Appears sleepy     Data Reviewed:  I have personally reviewed following labs    CBC Lab Results  Component Value Date   WBC 6.4 03/31/2024   RBC 5.53 03/31/2024   HGB 15.8 03/31/2024   HCT 47.9 03/31/2024   MCV 86.6 03/31/2024   MCH 28.6 03/31/2024   PLT 190 03/31/2024   MCHC 33.0 03/31/2024   RDW 14.1 03/31/2024   LYMPHSABS 2.7 03/07/2024   MONOABS 0.8 03/07/2024   EOSABS 0.1 03/07/2024   BASOSABS 0.1 03/07/2024     Last metabolic panel Lab Results  Component Value Date   NA 137 03/31/2024   K 3.5 03/31/2024   CL 100 03/31/2024   CO2 27 03/31/2024   BUN 13 03/31/2024   CREATININE 0.96 03/31/2024   GLUCOSE 86 03/31/2024   GFRNONAA >60 03/31/2024   GFRAA >60 12/26/2019   CALCIUM  9.4 03/31/2024   PHOS 2.8 03/31/2024   PROT 7.2 03/26/2024   ALBUMIN  3.6 03/31/2024   LABGLOB 2.4 09/01/2022   AGRATIO 1.8 09/01/2022   BILITOT 0.8 03/26/2024   ALKPHOS 78 03/26/2024   AST 24 03/26/2024   ALT 29 03/26/2024   ANIONGAP 10 03/31/2024    CBG (last  3)  No results for input(s): GLUCAP in the last 72 hours.    Coagulation Profile: No results for input(s): INR, PROTIME in the last 168 hours.   Radiology Studies: I have personally reviewed the imaging studies  No results found.     Nydia Distance M.D. [facility] 04/02/2024, 11:54 AM  Available via Epic secure chat 7am-7pm After 7 pm, please refer to night coverage provider listed on amion.

## 2024-04-02 NOTE — TOC Progression Note (Signed)
 Transition of Care [facility]) - Progression Note    Patient Details  Name: [name] [name] MRN: [id number] Date of Birth: 09/24/1965  [facility] of [facility]) CM/SW Contact  Doneta Glenys DASEN, RN Phone Number: 04/02/2024, 3:14 PM  Clinical Narrative:    CM called  APS Terris Gosling Social Worker [phone] left message concerning documentation for patients capacity to make medical decisions.      Barriers to Discharge: SNF Pending bed offer               Expected Discharge Plan and Services                                               Social Drivers of Health (SDOH) Interventions SDOH Screenings   Food Insecurity: Patient Unable To Answer (03/07/2024)  Housing: Unknown (03/07/2024)  Transportation Needs: Patient Unable To Answer (03/07/2024)  Utilities: Patient Unable To Answer (03/07/2024)  Depression (PHQ2-9): High Risk (01/26/2024)  Social Connections: Unknown (09/22/2021)   Received from [facility]  Tobacco Use: Low Risk  (03/13/2024)    Readmission Risk Interventions     No data to display

## 2024-04-03 DIAGNOSIS — M545 Low back pain, unspecified: Secondary | ICD-10-CM | POA: Diagnosis not present

## 2024-04-03 DIAGNOSIS — E782 Mixed hyperlipidemia: Secondary | ICD-10-CM | POA: Diagnosis not present

## 2024-04-03 DIAGNOSIS — G9340 Encephalopathy, unspecified: Secondary | ICD-10-CM | POA: Diagnosis not present

## 2024-04-03 DIAGNOSIS — G2401 Drug induced subacute dyskinesia: Secondary | ICD-10-CM | POA: Diagnosis not present

## 2024-04-03 LAB — VALPROIC ACID LEVEL: Valproic Acid Lvl: 76 ug/mL (ref 50–100)

## 2024-04-03 NOTE — Plan of Care (Signed)

## 2024-04-03 NOTE — Consult Note (Signed)
 [facility] Psychiatric Consult Follow Up  Patient Name: .[name] [name]  MRN: [id number]  DOB: [birth year]  Consult Order details:  Orders (From admission, onward)     Start     Ordered   03/08/24 0958  IP CONSULT TO PSYCHIATRY       Ordering Provider: Madelyne Owen LABOR, MD  Provider:  (Not yet assigned)  Question Answer Comment  Location [facility]   Reason for Consult? adjustment of medications , admitted with delirium      03/08/24 0957             Mode of Visit: In person    Psychiatry Consult Evaluation  Service Date: April 03, 2024 LOS:  LOS: 27 days  Chief Complaint Delirium  Primary Psychiatric Diagnoses  Bipolar I Disorder 2.  Delirium  Assessment  [name] [name] is a 58 y.o. male admitted: Medicallyfor 03/02/2024  3:27 PM for Acute Encephalopathy. He carries the psychiatric diagnoses of Bipolar I Disorder.  On initial examination, patient can be seen laying in bed with sitter at bedside. The interview is complicated by the patient's lethargy and being in and out of consciousness. He was oriented to person, place, and time but disoriented to situation. He was unable to report why he was currently in the hospital. Per the sitter at bedside she was informed that the patient had a history of aggression but he had behaved well this morning. He was eating fine and the plan was to make sure that he remained awake during the day shift. The patient denied any SI/HI/AVH or paranoid thoughts about the staff. He stated that he was unsure where he would be able to go once he was discharged from the hospital. Please see plan below for detailed recommendations.   04/03/2024:  Since the most recent medication adjustment--Depakote  increased from 750 mg PO BID to 625 mg PO TID (5 days ago)--the patient has demonstrated no further disruptive behaviors. Current valproic acid  level is 76, decreased slightly from 79. Given the clinical response, a therapeutic  range of 60-80 appears appropriate for this patient with possible dementia-related behaviors.  On evaluation today, the patient is calm and appropriate. He attempted to mute the television upon this provider entering the room but instead pressed the nurse call button, stating, "Yeah, can you make all this beeping stop?" He greeted the provider appropriately and denied any concerns. He was observed with food on the table and stated, "I'm finished with this here."  Diagnoses:  Active Hospital problems: Principal Problem:   Acute encephalopathy Active Problems:   Chronic low back pain without sciatica   Acute delirium   Generalized weakness   Bipolar 1 disorder (HCC)   Goals of care, counseling/discussion   Counseling and coordination of care   Palliative care encounter    Plan   ## Psychiatric Medication Recommendations:  -Continue Cymbalta  40 mg PO BID -Continue Depakote  750 mg PO BID -Continue Hydroxyzine  50 mg at bedtime for sleep.  -Please avoid Valium  or any other Benzodiazepine due to risk of disinhibition and worsening confusion  -Give Haldol  5 mg PO/IM Q 8 HR PRN for acute agitation -Added Trazodone  50 mg at bedtime PRN sleep  ## Medical Decision Making Capacity: Patient lacks capacity to make medical decisions at this time. Would benefit from a competency evaluation.    EKG on 10/24 of 474  ## Further Work-up:  -- Per primary team -- Pertinent labwork reviewed earlier this admission includes:  Recent Results (from  the past 2160 hours)  Urinalysis, Routine w reflex microscopic -Urine, Clean Catch     Status: None   Collection Time: 02/19/24  7:48 AM  Result Value Ref Range   Color, Urine YELLOW YELLOW   APPearance CLEAR CLEAR   Specific Gravity, Urine 1.018 1.005 - 1.030   pH 6.0 5.0 - 8.0   Glucose, UA NEGATIVE NEGATIVE mg/dL   Hgb urine dipstick NEGATIVE NEGATIVE   Bilirubin Urine NEGATIVE NEGATIVE   Ketones, ur NEGATIVE NEGATIVE mg/dL   Protein, ur NEGATIVE  NEGATIVE mg/dL   Nitrite NEGATIVE NEGATIVE   Leukocytes,Ua NEGATIVE NEGATIVE    Comment: Performed at [facility], [address] [place], [place] [address]  CBC with Differential/Platelet     Status: None   Collection Time: 02/19/24  8:24 AM  Result Value Ref Range   WBC 8.4 4.0 - 10.5 K/uL   RBC 5.03 4.22 - 5.81 MIL/uL   Hemoglobin 14.2 13.0 - 17.0 g/dL   HCT 57.1 60.9 - 47.9 %   MCV 85.1 80.0 - 100.0 fL   MCH 28.2 26.0 - 34.0 pg   MCHC 33.2 30.0 - 36.0 g/dL   RDW 85.9 88.4 - 84.4 %   Platelets 217 150 - 400 K/uL   nRBC 0.0 0.0 - 0.2 %   Neutrophils Relative % 54 %   Neutro Abs 4.5 1.7 - 7.7 K/uL   Lymphocytes Relative 33 %   Lymphs Abs 2.8 0.7 - 4.0 K/uL   Monocytes Relative 11 %   Monocytes Absolute 0.9 0.1 - 1.0 K/uL   Eosinophils Relative 1 %   Eosinophils Absolute 0.0 0.0 - 0.5 K/uL   Basophils Relative 1 %   Basophils Absolute 0.1 0.0 - 0.1 K/uL   Immature Granulocytes 0 %   Abs Immature Granulocytes 0.03 0.00 - 0.07 K/uL    Comment: Performed at [facility], [address], [place], [place] [address]  Comprehensive metabolic panel with GFR     Status: Abnormal   Collection Time: 02/19/24  8:24 AM  Result Value Ref Range   Sodium 136 135 - 145 mmol/L   Potassium 4.1 3.5 - 5.1 mmol/L   Chloride 99 98 - 111 mmol/L   CO2 26 22 - 32 mmol/L   Glucose, Bld 122 (H) 70 - 99 mg/dL    Comment: Glucose reference range applies only to samples taken after fasting for at least 8 hours.   BUN 19 6 - 20 mg/dL   Creatinine, Ser 8.95 0.61 - 1.24 mg/dL   Calcium  9.8 8.9 - 10.3 mg/dL   Total Protein 6.9 6.5 - 8.1 g/dL   Albumin  4.2 3.5 - 5.0 g/dL   AST 17 15 - 41 U/L   ALT 10 0 - 44 U/L   Alkaline Phosphatase 49 38 - 126 U/L   Total Bilirubin 0.5 0.0 - 1.2 mg/dL   GFR, Estimated >39 >39 mL/min    Comment: (NOTE) Calculated using the CKD-EPI Creatinine Equation (2021)    Anion gap 11 5 - 15    Comment: Performed at [facility], [address] [place], [place] [address]  Comprehensive metabolic panel     Status: Abnormal   Collection Time: 02/25/24  7:59 PM  Result Value Ref Range   Sodium 135 135 - 145 mmol/L   Potassium 4.2 3.5 - 5.1 mmol/L   Chloride 99 98 - 111 mmol/L   CO2 23 22 - 32 mmol/L   Glucose, Bld  106 (H) 70 - 99 mg/dL    Comment: Glucose reference range applies only to samples taken after fasting for at least 8 hours.   BUN 17 6 - 20 mg/dL   Creatinine, Ser 8.80 0.61 - 1.24 mg/dL   Calcium  9.8 8.9 - 10.3 mg/dL   Total Protein 7.3 6.5 - 8.1 g/dL   Albumin  3.9 3.5 - 5.0 g/dL   AST 18 15 - 41 U/L   ALT 17 0 - 44 U/L   Alkaline Phosphatase 57 38 - 126 U/L   Total Bilirubin 1.2 0.0 - 1.2 mg/dL   GFR, Estimated >39 >39 mL/min    Comment: (NOTE) Calculated using the CKD-EPI Creatinine Equation (2021)    Anion gap 13 5 - 15    Comment: Performed at [facility], [address], [place], [place] [address]  CBC     Status: None   Collection Time: 02/25/24  7:59 PM  Result Value Ref Range   WBC 9.7 4.0 - 10.5 K/uL   RBC 5.73 4.22 - 5.81 MIL/uL   Hemoglobin 16.4 13.0 - 17.0 g/dL   HCT 51.6 60.9 - 47.9 %   MCV 84.3 80.0 - 100.0 fL   MCH 28.6 26.0 - 34.0 pg   MCHC 34.0 30.0 - 36.0 g/dL   RDW 85.9 88.4 - 84.4 %   Platelets 288 150 - 400 K/uL   nRBC 0.0 0.0 - 0.2 %    Comment: Performed at [facility], [address], [place], [place] [address]  CBG monitoring, ED     Status: Abnormal   Collection Time: 02/25/24  8:01 PM  Result Value Ref Range   Glucose-Capillary 102 (H) 70 - 99 mg/dL    Comment: Glucose reference range applies only to samples taken after fasting for at least 8 hours.  Ammonia     Status: None   Collection Time: 02/25/24  8:33 PM  Result Value Ref Range   Ammonia 34 9 - 35 umol/L    Comment: HEMOLYSIS AT THIS LEVEL MAY AFFECT RESULT Performed at [facility], [address] [place], [place] [address]   Resp panel by RT-PCR (RSV, Flu A&B,  Covid) Anterior Nasal Swab     Status: None   Collection Time: 02/25/24  8:45 PM   Specimen: Anterior Nasal Swab  Result Value Ref Range   SARS Coronavirus 2 by RT PCR NEGATIVE NEGATIVE   Influenza A by PCR NEGATIVE NEGATIVE   Influenza B by PCR NEGATIVE NEGATIVE    Comment: (NOTE) The Xpert Xpress SARS-CoV-2/FLU/RSV plus assay is intended as an aid in the diagnosis of influenza from Nasopharyngeal swab specimens and should not be used as a sole basis for treatment. Nasal washings and aspirates are unacceptable for Xpert Xpress SARS-CoV-2/FLU/RSV testing.  Fact Sheet for Patients: bloggercourse.com  Fact Sheet for Healthcare Providers: seriousbroker.it  This test is not yet approved or cleared by the [place]  FDA and has been authorized for detection and/or diagnosis of SARS-CoV-2 by FDA under an Emergency Use Authorization (EUA). This EUA will remain in effect (meaning this test can be used) for the duration of the COVID-19 declaration under Section 564(b)(1) of the Act, 21 U.S.C. section 360bbb-3(b)(1), unless the authorization is terminated or revoked.     Resp Syncytial Virus by PCR NEGATIVE NEGATIVE    Comment: (NOTE) Fact Sheet for Patients: bloggercourse.com  Fact Sheet for Healthcare Providers: seriousbroker.it  This test is not yet approved or cleared by the  [place]  FDA and has been authorized for detection and/or diagnosis of SARS-CoV-2 by FDA under an Emergency Use Authorization (EUA). This EUA will remain in effect (meaning this test can be used) for the duration of the COVID-19 declaration under Section 564(b)(1) of the Act, 21 U.S.C. section 360bbb-3(b)(1), unless the authorization is terminated or revoked.  Performed at [facility], [address], [place], [place] [address]   Ethanol     Status: None   Collection Time: 02/25/24  9:00 PM   Result Value Ref Range   Alcohol , Ethyl (B) <15 <15 mg/dL    Comment: (NOTE) For medical purposes only. Performed at [facility], [address], [place], [place] [address]   Urinalysis, Routine w reflex microscopic -Urine, Clean Catch     Status: Abnormal   Collection Time: 02/25/24 11:30 PM  Result Value Ref Range   Color, Urine YELLOW YELLOW   APPearance CLEAR CLEAR   Specific Gravity, Urine 1.018 1.005 - 1.030   pH 5.0 5.0 - 8.0   Glucose, UA NEGATIVE NEGATIVE mg/dL   Hgb urine dipstick NEGATIVE NEGATIVE   Bilirubin Urine NEGATIVE NEGATIVE   Ketones, ur 5 (A) NEGATIVE mg/dL   Protein, ur NEGATIVE NEGATIVE mg/dL   Nitrite NEGATIVE NEGATIVE   Leukocytes,Ua NEGATIVE NEGATIVE    Comment: Performed at [facility], [address], [place], [place] [address]  Rapid urine drug screen (hospital performed)     Status: None   Collection Time: 02/25/24 11:30 PM  Result Value Ref Range   Opiates NONE DETECTED NONE DETECTED   Cocaine NONE DETECTED NONE DETECTED   Benzodiazepines NONE DETECTED NONE DETECTED   Amphetamines NONE DETECTED NONE DETECTED   Tetrahydrocannabinol NONE DETECTED NONE DETECTED   Barbiturates NONE DETECTED NONE DETECTED    Comment: (NOTE) DRUG SCREEN FOR MEDICAL PURPOSES ONLY.  IF CONFIRMATION IS NEEDED FOR ANY PURPOSE, NOTIFY LAB WITHIN 5 DAYS.  LOWEST DETECTABLE LIMITS FOR URINE DRUG SCREEN Drug Class                     Cutoff (ng/mL) Amphetamine and metabolites    1000 Barbiturate and metabolites    200 Benzodiazepine                 200 Opiates and metabolites        300 Cocaine and metabolites        300 THC                            50 Performed at [facility], [address], [place], [place] [address]   Basic metabolic panel     Status: Abnormal   Collection Time: 03/02/24  5:33 PM  Result Value Ref Range   Sodium 135 135 - 145 mmol/L   Potassium 4.5 3.5 - 5.1 mmol/L   Chloride 97 (L) 98 - 111 mmol/L   CO2 28 22 - 32  mmol/L   Glucose, Bld 98 70 - 99 mg/dL    Comment: Glucose reference range applies only to samples taken after fasting for at least 8 hours.   BUN 18 6 - 20 mg/dL   Creatinine, Ser 8.86 0.61 - 1.24 mg/dL   Calcium  10.0 8.9 - 10.3 mg/dL   GFR, Estimated >39 >39 mL/min    Comment: (NOTE) Calculated using the CKD-EPI Creatinine Equation (2021)    Anion gap 11 5 - 15  Comment: Performed at [facility], [address], [place], [place] [address]  CBC with Differential     Status: Abnormal   Collection Time: 03/02/24  5:33 PM  Result Value Ref Range   WBC 8.7 4.0 - 10.5 K/uL   RBC 6.05 (H) 4.22 - 5.81 MIL/uL   Hemoglobin 17.1 (H) 13.0 - 17.0 g/dL   HCT 48.0 60.9 - 47.9 %   MCV 85.8 80.0 - 100.0 fL   MCH 28.3 26.0 - 34.0 pg   MCHC 32.9 30.0 - 36.0 g/dL   RDW 86.2 88.4 - 84.4 %   Platelets 313 150 - 400 K/uL   nRBC 0.0 0.0 - 0.2 %   Neutrophils Relative % 56 %   Neutro Abs 5.0 1.7 - 7.7 K/uL   Lymphocytes Relative 32 %   Lymphs Abs 2.8 0.7 - 4.0 K/uL   Monocytes Relative 9 %   Monocytes Absolute 0.8 0.1 - 1.0 K/uL   Eosinophils Relative 1 %   Eosinophils Absolute 0.1 0.0 - 0.5 K/uL   Basophils Relative 1 %   Basophils Absolute 0.1 0.0 - 0.1 K/uL   Immature Granulocytes 1 %   Abs Immature Granulocytes 0.04 0.00 - 0.07 K/uL    Comment: Performed at [facility], [address], [place], [place] [address]  CBG monitoring, ED     Status: Abnormal   Collection Time: 03/03/24  9:06 AM  Result Value Ref Range   Glucose-Capillary 113 (H) 70 - 99 mg/dL    Comment: Glucose reference range applies only to samples taken after fasting for at least 8 hours.  Ammonia     Status: None   Collection Time: 03/07/24  3:35 PM  Result Value Ref Range   Ammonia <13 9 - 35 umol/L    Comment: Performed at [facility], [address], [place], [place] [address]  CBC with Differential     Status: Abnormal   Collection Time: 03/07/24  3:35 PM   Result Value Ref Range   WBC 7.9 4.0 - 10.5 K/uL   RBC 6.22 (H) 4.22 - 5.81 MIL/uL   Hemoglobin 17.7 (H) 13.0 - 17.0 g/dL   HCT 46.5 (H) 60.9 - 47.9 %   MCV 85.9 80.0 - 100.0 fL   MCH 28.5 26.0 - 34.0 pg   MCHC 33.1 30.0 - 36.0 g/dL   RDW 86.2 88.4 - 84.4 %   Platelets 316 150 - 400 K/uL   nRBC 0.0 0.0 - 0.2 %   Neutrophils Relative % 53 %   Neutro Abs 4.2 1.7 - 7.7 K/uL   Lymphocytes Relative 34 %   Lymphs Abs 2.7 0.7 - 4.0 K/uL   Monocytes Relative 10 %   Monocytes Absolute 0.8 0.1 - 1.0 K/uL   Eosinophils Relative 2 %   Eosinophils Absolute 0.1 0.0 - 0.5 K/uL   Basophils Relative 1 %   Basophils Absolute 0.1 0.0 - 0.1 K/uL   Immature Granulocytes 0 %   Abs Immature Granulocytes 0.02 0.00 - 0.07 K/uL    Comment: Performed at [facility], [address] [place], [place] [address]  Comprehensive metabolic panel     Status: Abnormal   Collection Time: 03/07/24  3:35 PM  Result Value Ref Range   Sodium 137 135 - 145 mmol/L   Potassium 4.5 3.5 - 5.1 mmol/L   Chloride 97 (L) 98 - 111 mmol/L   CO2 28 22 - 32 mmol/L   Glucose,  Bld 99 70 - 99 mg/dL    Comment: Glucose reference range applies only to samples taken after fasting for at least 8 hours.   BUN 21 (H) 6 - 20 mg/dL   Creatinine, Ser 8.87 0.61 - 1.24 mg/dL   Calcium  10.2 8.9 - 10.3 mg/dL   Total Protein 8.2 (H) 6.5 - 8.1 g/dL   Albumin  4.6 3.5 - 5.0 g/dL   AST 29 15 - 41 U/L   ALT 23 0 - 44 U/L   Alkaline Phosphatase 100 38 - 126 U/L   Total Bilirubin 0.8 0.0 - 1.2 mg/dL   GFR, Estimated >39 >39 mL/min    Comment: (NOTE) Calculated using the CKD-EPI Creatinine Equation (2021)    Anion gap 12 5 - 15    Comment: Performed at [facility], [address] [place], [place] [address]  Urinalysis, w/ Reflex to Culture (Infection Suspected) -Urine, Clean Catch     Status: Abnormal   Collection Time: 03/07/24  8:22 PM  Result Value Ref Range   Specimen Source URINE, CLEAN CATCH     Color, Urine YELLOW YELLOW   APPearance CLEAR CLEAR   Specific Gravity, Urine 1.024 1.005 - 1.030   pH 6.0 5.0 - 8.0   Glucose, UA NEGATIVE NEGATIVE mg/dL   Hgb urine dipstick NEGATIVE NEGATIVE   Bilirubin Urine NEGATIVE NEGATIVE   Ketones, ur 5 (A) NEGATIVE mg/dL   Protein, ur NEGATIVE NEGATIVE mg/dL   Nitrite NEGATIVE NEGATIVE   Leukocytes,Ua TRACE (A) NEGATIVE   RBC / HPF 0-5 0 - 5 RBC/hpf   WBC, UA 0-5 0 - 5 WBC/hpf    Comment:        Reflex urine culture not performed if WBC <=10, OR if Squamous epithelial cells >5. If Squamous epithelial cells >5 suggest recollection.    Bacteria, UA RARE (A) NONE SEEN   Squamous Epithelial / HPF 0-5 0 - 5 /HPF   Mucus PRESENT     Comment: Performed at [facility], [address] [place], [place] [address]  HIV Antibody (routine testing w rflx)     Status: None   Collection Time: 03/08/24  5:08 AM  Result Value Ref Range   HIV Screen 4th Generation wRfx Non Reactive Non Reactive    Comment: Performed at [facility], [address] [place], [place] [address]  Basic metabolic panel     Status: Abnormal   Collection Time: 03/08/24  5:08 AM  Result Value Ref Range   Sodium 136 135 - 145 mmol/L   Potassium 4.6 3.5 - 5.1 mmol/L   Chloride 98 98 - 111 mmol/L   CO2 22 22 - 32 mmol/L   Glucose, Bld 60 (L) 70 - 99 mg/dL    Comment: Glucose reference range applies only to samples taken after fasting for at least 8 hours.   BUN 22 (H) 6 - 20 mg/dL   Creatinine, Ser 8.86 0.61 - 1.24 mg/dL   Calcium  10.3 8.9 - 10.3 mg/dL   GFR, Estimated >39 >39 mL/min    Comment: (NOTE) Calculated using the CKD-EPI Creatinine Equation (2021)    Anion gap 17 (H) 5 - 15    Comment: Performed at [facility], [address] [place], [place] [address]  CBC     Status: Abnormal   Collection Time: 03/08/24  5:08 AM  Result Value Ref Range   WBC 8.5 4.0 - 10.5 K/uL   RBC 6.33 (H) 4.22 - 5.81  MIL/uL   Hemoglobin  17.7 (H) 13.0 - 17.0 g/dL   HCT 44.8 (H) 60.9 - 47.9 %   MCV 87.0 80.0 - 100.0 fL   MCH 28.0 26.0 - 34.0 pg   MCHC 32.1 30.0 - 36.0 g/dL   RDW 85.9 88.4 - 84.4 %   Platelets 333 150 - 400 K/uL   nRBC 0.0 0.0 - 0.2 %    Comment: Performed at [facility], [address], [place], [place] [address]  Vitamin B12     Status: None   Collection Time: 03/08/24  5:08 AM  Result Value Ref Range   Vitamin B-12 573 180 - 914 pg/mL    Comment: Performed at [facility], [address] [place], [place] [address]  TSH     Status: None   Collection Time: 03/08/24  5:08 AM  Result Value Ref Range   TSH 2.640 0.350 - 4.500 uIU/mL    Comment: Performed at [facility], [address] [place], [place] [address]  Vitamin B1     Status: None   Collection Time: 03/08/24  5:08 AM  Result Value Ref Range   Vitamin B1 (Thiamine ) 151.8 66.5 - 200.0 nmol/L    Comment: (NOTE) This test was developed and its performance characteristics determined by [facility]. It has not been cleared or approved by the Food and Drug Administration. Performed At: [facility] [address] [place], [place] [id number] Jennette Shorter MD Ey:[id number]   Glucose, capillary     Status: None   Collection Time: 03/08/24  7:30 AM  Result Value Ref Range   Glucose-Capillary 95 70 - 99 mg/dL    Comment: Glucose reference range applies only to samples taken after fasting for at least 8 hours.  Blood gas, arterial     Status: Abnormal   Collection Time: 03/08/24 10:15 AM  Result Value Ref Range   FIO2 21 %   pH, Arterial 7.49 (H) 7.35 - 7.45   pCO2 arterial 34 32 - 48 mmHg   pO2, Arterial 65 (L) 83 - 108 mmHg   Bicarbonate 26.1 20.0 - 28.0 mmol/L   Acid-Base Excess 2.8 (H) 0.0 - 2.0 mmol/L   O2 Saturation 95.5 %   Patient temperature 36.7    Collection site RIGHT RADIAL    Drawn by [id number]    Allens test (pass/fail) PASS PASS    Comment: Performed at [facility], [address] [place], [place] [address]  Glucose, capillary     Status: Abnormal   Collection Time: 03/08/24 11:48 AM  Result Value Ref Range   Glucose-Capillary 179 (H) 70 - 99 mg/dL    Comment: Glucose reference range applies only to samples taken after fasting for at least 8 hours.  Valproic acid  level     Status: None   Collection Time: 03/08/24  3:41 PM  Result Value Ref Range   Valproic Acid  Lvl 73 50 - 100 ug/mL    Comment: Performed at [facility], [address] [place], [place] [address]  Glucose, capillary     Status: Abnormal   Collection Time: 03/08/24  4:33 PM  Result Value Ref Range   Glucose-Capillary 104 (H) 70 - 99 mg/dL    Comment: Glucose reference range applies only to samples taken after fasting for at least 8 hours.  Glucose, capillary     Status: None   Collection Time: 03/08/24  8:20 PM  Result Value Ref Range  Glucose-Capillary 93 70 - 99 mg/dL    Comment: Glucose reference range applies only to samples taken after fasting for at least 8 hours.  Glucose, capillary     Status: None   Collection Time: 03/09/24  7:29 AM  Result Value Ref Range   Glucose-Capillary 84 70 - 99 mg/dL    Comment: Glucose reference range applies only to samples taken after fasting for at least 8 hours.  CBC     Status: None   Collection Time: 03/09/24  8:21 AM  Result Value Ref Range   WBC 6.4 4.0 - 10.5 K/uL   RBC 5.57 4.22 - 5.81 MIL/uL   Hemoglobin 15.9 13.0 - 17.0 g/dL   HCT 52.0 60.9 - 47.9 %   MCV 86.0 80.0 - 100.0 fL   MCH 28.5 26.0 - 34.0 pg   MCHC 33.2 30.0 - 36.0 g/dL   RDW 86.4 88.4 - 84.4 %   Platelets 260 150 - 400 K/uL   nRBC 0.0 0.0 - 0.2 %    Comment: Performed at [facility], [address], [place], [place] [address]  Basic metabolic panel     Status: None   Collection Time: 03/09/24  8:21 AM  Result Value Ref Range   Sodium 138 135 - 145 mmol/L   Potassium 4.2 3.5 - 5.1 mmol/L   Chloride  101 98 - 111 mmol/L   CO2 25 22 - 32 mmol/L   Glucose, Bld 82 70 - 99 mg/dL    Comment: Glucose reference range applies only to samples taken after fasting for at least 8 hours.   BUN 19 6 - 20 mg/dL   Creatinine, Ser 9.07 0.61 - 1.24 mg/dL   Calcium  9.6 8.9 - 10.3 mg/dL   GFR, Estimated >39 >39 mL/min    Comment: (NOTE) Calculated using the CKD-EPI Creatinine Equation (2021)    Anion gap 12 5 - 15    Comment: Performed at [facility], [address], [place], [place] [address]  Glucose, capillary     Status: Abnormal   Collection Time: 03/09/24 11:32 AM  Result Value Ref Range   Glucose-Capillary 118 (H) 70 - 99 mg/dL    Comment: Glucose reference range applies only to samples taken after fasting for at least 8 hours.  Glucose, capillary     Status: Abnormal   Collection Time: 03/09/24  4:17 PM  Result Value Ref Range   Glucose-Capillary 121 (H) 70 - 99 mg/dL    Comment: Glucose reference range applies only to samples taken after fasting for at least 8 hours.  Glucose, capillary     Status: Abnormal   Collection Time: 03/09/24  9:13 PM  Result Value Ref Range   Glucose-Capillary 102 (H) 70 - 99 mg/dL    Comment: Glucose reference range applies only to samples taken after fasting for at least 8 hours.  Glucose, capillary     Status: Abnormal   Collection Time: 03/10/24  9:29 AM  Result Value Ref Range   Glucose-Capillary 171 (H) 70 - 99 mg/dL    Comment: Glucose reference range applies only to samples taken after fasting for at least 8 hours.  Glucose, capillary     Status: Abnormal   Collection Time: 03/10/24 12:57 PM  Result Value Ref Range   Glucose-Capillary 133 (H) 70 - 99 mg/dL    Comment: Glucose reference range applies only to samples taken after fasting for at least 8 hours.  Glucose, capillary  Status: None   Collection Time: 03/10/24  5:11 PM  Result Value Ref Range   Glucose-Capillary 99 70 - 99 mg/dL    Comment: Glucose reference range  applies only to samples taken after fasting for at least 8 hours.  Glucose, capillary     Status: Abnormal   Collection Time: 03/10/24 10:01 PM  Result Value Ref Range   Glucose-Capillary 110 (H) 70 - 99 mg/dL    Comment: Glucose reference range applies only to samples taken after fasting for at least 8 hours.  Glucose, capillary     Status: None   Collection Time: 03/11/24  7:23 AM  Result Value Ref Range   Glucose-Capillary 87 70 - 99 mg/dL    Comment: Glucose reference range applies only to samples taken after fasting for at least 8 hours.  Glucose, capillary     Status: Abnormal   Collection Time: 03/11/24 12:05 PM  Result Value Ref Range   Glucose-Capillary 186 (H) 70 - 99 mg/dL    Comment: Glucose reference range applies only to samples taken after fasting for at least 8 hours.  Glucose, capillary     Status: None   Collection Time: 03/11/24  4:37 PM  Result Value Ref Range   Glucose-Capillary 87 70 - 99 mg/dL    Comment: Glucose reference range applies only to samples taken after fasting for at least 8 hours.  Glucose, capillary     Status: None   Collection Time: 03/11/24  9:11 PM  Result Value Ref Range   Glucose-Capillary 95 70 - 99 mg/dL    Comment: Glucose reference range applies only to samples taken after fasting for at least 8 hours.  CBC     Status: None   Collection Time: 03/12/24  4:29 AM  Result Value Ref Range   WBC 5.9 4.0 - 10.5 K/uL   RBC 5.50 4.22 - 5.81 MIL/uL   Hemoglobin 15.2 13.0 - 17.0 g/dL   HCT 52.3 60.9 - 47.9 %   MCV 86.5 80.0 - 100.0 fL   MCH 27.6 26.0 - 34.0 pg   MCHC 31.9 30.0 - 36.0 g/dL   RDW 86.3 88.4 - 84.4 %   Platelets 222 150 - 400 K/uL   nRBC 0.0 0.0 - 0.2 %    Comment: Performed at [facility], [address], [place], [place] [address]  Basic metabolic panel with GFR     Status: None   Collection Time: 03/12/24  4:29 AM  Result Value Ref Range   Sodium 139 135 - 145 mmol/L   Potassium 3.9 3.5 - 5.1 mmol/L    Chloride 101 98 - 111 mmol/L   CO2 28 22 - 32 mmol/L   Glucose, Bld 78 70 - 99 mg/dL    Comment: Glucose reference range applies only to samples taken after fasting for at least 8 hours.   BUN 14 6 - 20 mg/dL   Creatinine, Ser 8.97 0.61 - 1.24 mg/dL   Calcium  9.7 8.9 - 10.3 mg/dL   GFR, Estimated >39 >39 mL/min    Comment: (NOTE) Calculated using the CKD-EPI Creatinine Equation (2021)    Anion gap 10 5 - 15    Comment: Performed at [facility], [address], [place], [place] [address]  Glucose, capillary     Status: None   Collection Time: 03/12/24  7:38 AM  Result Value Ref Range   Glucose-Capillary 79 70 - 99 mg/dL    Comment: Glucose reference range applies  only to samples taken after fasting for at least 8 hours.  Glucose, capillary     Status: None   Collection Time: 03/12/24 11:35 AM  Result Value Ref Range   Glucose-Capillary 95 70 - 99 mg/dL    Comment: Glucose reference range applies only to samples taken after fasting for at least 8 hours.  Glucose, capillary     Status: None   Collection Time: 03/12/24  4:21 PM  Result Value Ref Range   Glucose-Capillary 96 70 - 99 mg/dL    Comment: Glucose reference range applies only to samples taken after fasting for at least 8 hours.  Glucose, capillary     Status: None   Collection Time: 03/12/24  8:47 PM  Result Value Ref Range   Glucose-Capillary 97 70 - 99 mg/dL    Comment: Glucose reference range applies only to samples taken after fasting for at least 8 hours.   Comment 1 Notify RN    Comment 2 Document in Chart   Glucose, capillary     Status: None   Collection Time: 03/13/24 10:38 AM  Result Value Ref Range   Glucose-Capillary 91 70 - 99 mg/dL    Comment: Glucose reference range applies only to samples taken after fasting for at least 8 hours.  MRSA Next Gen by PCR, Nasal     Status: Abnormal   Collection Time: 03/13/24  2:17 PM   Specimen: Nasal Mucosa; Nasal Swab  Result Value Ref Range   MRSA  by PCR Next Gen DETECTED (A) NOT DETECTED    Comment: (NOTE) The GeneXpert MRSA Assay (FDA approved for NASAL specimens only), is one component of a comprehensive MRSA colonization surveillance program. It is not intended to diagnose MRSA infection nor to guide or monitor treatment for MRSA infections. Test performance is not FDA approved in patients less than 29 years old. Performed at [facility], [address], [place], [place] [address]   CBC     Status: None   Collection Time: 03/13/24  2:42 PM  Result Value Ref Range   WBC 6.1 4.0 - 10.5 K/uL   RBC 5.08 4.22 - 5.81 MIL/uL   Hemoglobin 14.1 13.0 - 17.0 g/dL   HCT 55.4 60.9 - 47.9 %   MCV 87.6 80.0 - 100.0 fL   MCH 27.8 26.0 - 34.0 pg   MCHC 31.7 30.0 - 36.0 g/dL   RDW 86.3 88.4 - 84.4 %   Platelets 213 150 - 400 K/uL   nRBC 0.0 0.0 - 0.2 %    Comment: Performed at [facility], [address], [place], [place] [address]  Basic metabolic panel     Status: None   Collection Time: 03/13/24  2:42 PM  Result Value Ref Range   Sodium 137 135 - 145 mmol/L   Potassium 4.1 3.5 - 5.1 mmol/L   Chloride 103 98 - 111 mmol/L   CO2 26 22 - 32 mmol/L   Glucose, Bld 85 70 - 99 mg/dL    Comment: Glucose reference range applies only to samples taken after fasting for at least 8 hours.   BUN 15 6 - 20 mg/dL   Creatinine, Ser 9.12 0.61 - 1.24 mg/dL   Calcium  9.1 8.9 - 10.3 mg/dL   GFR, Estimated >39 >39 mL/min    Comment: (NOTE) Calculated using the CKD-EPI Creatinine Equation (2021)    Anion gap 9 5 - 15    Comment: Performed at [facility], [address], [place], [place] [address]  Glucose, capillary     Status: None   Collection Time: 03/13/24  3:59 PM  Result Value Ref Range   Glucose-Capillary 77 70 - 99 mg/dL    Comment: Glucose reference range applies only to samples taken after fasting for at least 8 hours.   Comment 1 Notify RN    Comment 2 Document in Chart    Glucose, capillary     Status: Abnormal   Collection Time: 03/13/24  9:32 PM  Result Value Ref Range   Glucose-Capillary 64 (L) 70 - 99 mg/dL    Comment: Glucose reference range applies only to samples taken after fasting for at least 8 hours.   Comment 1 Notify RN    Comment 2 Document in Chart   Glucose, capillary     Status: None   Collection Time: 03/13/24  9:35 PM  Result Value Ref Range   Glucose-Capillary 71 70 - 99 mg/dL    Comment: Glucose reference range applies only to samples taken after fasting for at least 8 hours.   Comment 1 Notify RN    Comment 2 Document in Chart   Glucose, capillary     Status: None   Collection Time: 03/13/24 10:45 PM  Result Value Ref Range   Glucose-Capillary 94 70 - 99 mg/dL    Comment: Glucose reference range applies only to samples taken after fasting for at least 8 hours.   Comment 1 Notify RN    Comment 2 Document in Chart   Glucose, capillary     Status: None   Collection Time: 03/14/24  3:04 AM  Result Value Ref Range   Glucose-Capillary 87 70 - 99 mg/dL    Comment: Glucose reference range applies only to samples taken after fasting for at least 8 hours.  CBC     Status: None   Collection Time: 03/14/24  3:05 AM  Result Value Ref Range   WBC 4.8 4.0 - 10.5 K/uL   RBC 5.14 4.22 - 5.81 MIL/uL   Hemoglobin 14.6 13.0 - 17.0 g/dL   HCT 54.2 60.9 - 47.9 %   MCV 88.9 80.0 - 100.0 fL   MCH 28.4 26.0 - 34.0 pg   MCHC 31.9 30.0 - 36.0 g/dL   RDW 86.3 88.4 - 84.4 %   Platelets 182 150 - 400 K/uL   nRBC 0.0 0.0 - 0.2 %    Comment: Performed at [facility], [address] [place], [place] [address]  Basic metabolic panel with GFR     Status: None   Collection Time: 03/14/24  3:05 AM  Result Value Ref Range   Sodium 138 135 - 145 mmol/L   Potassium 3.9 3.5 - 5.1 mmol/L   Chloride 102 98 - 111 mmol/L   CO2 28 22 - 32 mmol/L   Glucose, Bld 83 70 - 99 mg/dL    Comment: Glucose reference range applies only to samples  taken after fasting for at least 8 hours.   BUN 12 6 - 20 mg/dL   Creatinine, Ser 9.19 0.61 - 1.24 mg/dL   Calcium  9.3 8.9 - 10.3 mg/dL   GFR, Estimated >39 >39 mL/min    Comment: (NOTE) Calculated using the CKD-EPI Creatinine Equation (2021)    Anion gap 8 5 - 15    Comment: Performed at [facility], [address] [place], [place] [address]  Glucose, capillary     Status: Abnormal   Collection Time: 03/14/24  5:02 PM  Result Value Ref Range   Glucose-Capillary 108 (H) 70 - 99 mg/dL    Comment: Glucose reference range applies only to samples taken after fasting for at least 8 hours.  Glucose, capillary     Status: Abnormal   Collection Time: 03/14/24 10:03 PM  Result Value Ref Range   Glucose-Capillary 154 (H) 70 - 99 mg/dL    Comment: Glucose reference range applies only to samples taken after fasting for at least 8 hours.  Glucose, capillary     Status: Abnormal   Collection Time: 03/15/24  8:00 AM  Result Value Ref Range   Glucose-Capillary 113 (H) 70 - 99 mg/dL    Comment: Glucose reference range applies only to samples taken after fasting for at least 8 hours.  Glucose, capillary     Status: None   Collection Time: 03/15/24 12:06 PM  Result Value Ref Range   Glucose-Capillary 89 70 - 99 mg/dL    Comment: Glucose reference range applies only to samples taken after fasting for at least 8 hours.  Glucose, capillary     Status: Abnormal   Collection Time: 03/15/24  5:06 PM  Result Value Ref Range   Glucose-Capillary 149 (H) 70 - 99 mg/dL    Comment: Glucose reference range applies only to samples taken after fasting for at least 8 hours.  Glucose, capillary     Status: Abnormal   Collection Time: 03/15/24  9:07 PM  Result Value Ref Range   Glucose-Capillary 119 (H) 70 - 99 mg/dL    Comment: Glucose reference range applies only to samples taken after fasting for at least 8 hours.   Comment 1 Notify RN   Glucose, capillary     Status: Abnormal    Collection Time: 03/16/24  7:24 AM  Result Value Ref Range   Glucose-Capillary 101 (H) 70 - 99 mg/dL    Comment: Glucose reference range applies only to samples taken after fasting for at least 8 hours.  Glucose, capillary     Status: Abnormal   Collection Time: 03/16/24 11:24 AM  Result Value Ref Range   Glucose-Capillary 144 (H) 70 - 99 mg/dL    Comment: Glucose reference range applies only to samples taken after fasting for at least 8 hours.  MRSA Next Gen by PCR, Nasal     Status: Abnormal   Collection Time: 03/16/24  2:20 PM   Specimen: Nasal Mucosa; Nasal Swab  Result Value Ref Range   MRSA by PCR Next Gen DETECTED (A) NOT DETECTED    Comment: (NOTE) The GeneXpert MRSA Assay (FDA approved for NASAL specimens only), is one component of a comprehensive MRSA colonization surveillance program. It is not intended to diagnose MRSA infection nor to guide or monitor treatment for MRSA infections. Test performance is not FDA approved in patients less than 1 years old. Performed at [facility], [address], [place], [place] [address]   Glucose, capillary     Status: Abnormal   Collection Time: 03/16/24  5:01 PM  Result Value Ref Range   Glucose-Capillary 127 (H) 70 - 99 mg/dL    Comment: Glucose reference range applies only to samples taken after fasting for at least 8 hours.  Glucose, capillary     Status: Abnormal   Collection Time: 03/16/24  9:25 PM  Result Value Ref Range   Glucose-Capillary 109 (H) 70 - 99 mg/dL    Comment: Glucose reference range applies only to samples taken after fasting for at least  8 hours.   Comment 1 Notify RN    Comment 2 Document in Chart   Glucose, capillary     Status: Abnormal   Collection Time: 03/17/24  7:44 AM  Result Value Ref Range   Glucose-Capillary 117 (H) 70 - 99 mg/dL    Comment: Glucose reference range applies only to samples taken after fasting for at least 8 hours.  Glucose, capillary     Status: Abnormal    Collection Time: 03/17/24 11:53 AM  Result Value Ref Range   Glucose-Capillary 143 (H) 70 - 99 mg/dL    Comment: Glucose reference range applies only to samples taken after fasting for at least 8 hours.  Glucose, capillary     Status: Abnormal   Collection Time: 03/17/24  4:56 PM  Result Value Ref Range   Glucose-Capillary 119 (H) 70 - 99 mg/dL    Comment: Glucose reference range applies only to samples taken after fasting for at least 8 hours.  Glucose, capillary     Status: Abnormal   Collection Time: 03/17/24  9:21 PM  Result Value Ref Range   Glucose-Capillary 112 (H) 70 - 99 mg/dL    Comment: Glucose reference range applies only to samples taken after fasting for at least 8 hours.   Comment 1 Notify RN   Glucose, capillary     Status: Abnormal   Collection Time: 03/18/24  7:31 AM  Result Value Ref Range   Glucose-Capillary 120 (H) 70 - 99 mg/dL    Comment: Glucose reference range applies only to samples taken after fasting for at least 8 hours.   Comment 1 Notify RN   Glucose, capillary     Status: Abnormal   Collection Time: 03/18/24 11:26 AM  Result Value Ref Range   Glucose-Capillary 174 (H) 70 - 99 mg/dL    Comment: Glucose reference range applies only to samples taken after fasting for at least 8 hours.   Comment 1 Notify RN   Glucose, capillary     Status: Abnormal   Collection Time: 03/18/24  5:18 PM  Result Value Ref Range   Glucose-Capillary 130 (H) 70 - 99 mg/dL    Comment: Glucose reference range applies only to samples taken after fasting for at least 8 hours.   Comment 1 Notify RN   Glucose, capillary     Status: Abnormal   Collection Time: 03/18/24  8:57 PM  Result Value Ref Range   Glucose-Capillary 148 (H) 70 - 99 mg/dL    Comment: Glucose reference range applies only to samples taken after fasting for at least 8 hours.   Comment 1 Notify RN   Glucose, capillary     Status: Abnormal   Collection Time: 03/19/24  8:02 AM  Result Value Ref Range    Glucose-Capillary 123 (H) 70 - 99 mg/dL    Comment: Glucose reference range applies only to samples taken after fasting for at least 8 hours.  Glucose, capillary     Status: Abnormal   Collection Time: 03/19/24 11:57 AM  Result Value Ref Range   Glucose-Capillary 128 (H) 70 - 99 mg/dL    Comment: Glucose reference range applies only to samples taken after fasting for at least 8 hours.  Glucose, capillary     Status: Abnormal   Collection Time: 03/19/24  4:51 PM  Result Value Ref Range   Glucose-Capillary 116 (H) 70 - 99 mg/dL    Comment: Glucose reference range applies only to samples taken after fasting for at least 8  hours.  Glucose, capillary     Status: Abnormal   Collection Time: 03/19/24  9:05 PM  Result Value Ref Range   Glucose-Capillary 139 (H) 70 - 99 mg/dL    Comment: Glucose reference range applies only to samples taken after fasting for at least 8 hours.  Glucose, capillary     Status: Abnormal   Collection Time: 03/19/24 10:11 PM  Result Value Ref Range   Glucose-Capillary 136 (H) 70 - 99 mg/dL    Comment: Glucose reference range applies only to samples taken after fasting for at least 8 hours.  Glucose, capillary     Status: Abnormal   Collection Time: 03/20/24  7:44 AM  Result Value Ref Range   Glucose-Capillary 132 (H) 70 - 99 mg/dL    Comment: Glucose reference range applies only to samples taken after fasting for at least 8 hours.  Glucose, capillary     Status: Abnormal   Collection Time: 03/20/24 11:39 AM  Result Value Ref Range   Glucose-Capillary 148 (H) 70 - 99 mg/dL    Comment: Glucose reference range applies only to samples taken after fasting for at least 8 hours.  Glucose, capillary     Status: Abnormal   Collection Time: 03/20/24  4:46 PM  Result Value Ref Range   Glucose-Capillary 154 (H) 70 - 99 mg/dL    Comment: Glucose reference range applies only to samples taken after fasting for at least 8 hours.  Glucose, capillary     Status: Abnormal    Collection Time: 03/20/24  7:52 PM  Result Value Ref Range   Glucose-Capillary 159 (H) 70 - 99 mg/dL    Comment: Glucose reference range applies only to samples taken after fasting for at least 8 hours.  Glucose, capillary     Status: Abnormal   Collection Time: 03/21/24  7:36 AM  Result Value Ref Range   Glucose-Capillary 127 (H) 70 - 99 mg/dL    Comment: Glucose reference range applies only to samples taken after fasting for at least 8 hours.  Glucose, capillary     Status: Abnormal   Collection Time: 03/21/24 11:42 AM  Result Value Ref Range   Glucose-Capillary 170 (H) 70 - 99 mg/dL    Comment: Glucose reference range applies only to samples taken after fasting for at least 8 hours.  Glucose, capillary     Status: Abnormal   Collection Time: 03/21/24  4:46 PM  Result Value Ref Range   Glucose-Capillary 181 (H) 70 - 99 mg/dL    Comment: Glucose reference range applies only to samples taken after fasting for at least 8 hours.  Glucose, capillary     Status: Abnormal   Collection Time: 03/21/24  9:08 PM  Result Value Ref Range   Glucose-Capillary 136 (H) 70 - 99 mg/dL    Comment: Glucose reference range applies only to samples taken after fasting for at least 8 hours.  Comprehensive metabolic panel with GFR     Status: Abnormal   Collection Time: 03/22/24  4:36 AM  Result Value Ref Range   Sodium 137 135 - 145 mmol/L   Potassium 3.6 3.5 - 5.1 mmol/L   Chloride 101 98 - 111 mmol/L   CO2 25 22 - 32 mmol/L   Glucose, Bld 112 (H) 70 - 99 mg/dL    Comment: Glucose reference range applies only to samples taken after fasting for at least 8 hours.   BUN 15 6 - 20 mg/dL   Creatinine, Ser 9.15 0.61 -  1.24 mg/dL   Calcium  9.4 8.9 - 10.3 mg/dL   Total Protein 6.6 6.5 - 8.1 g/dL   Albumin  3.8 3.5 - 5.0 g/dL   AST 24 15 - 41 U/L   ALT 34 0 - 44 U/L   Alkaline Phosphatase 69 38 - 126 U/L   Total Bilirubin 0.6 0.0 - 1.2 mg/dL   GFR, Estimated >39 >39 mL/min    Comment: (NOTE) Calculated  using the CKD-EPI Creatinine Equation (2021)    Anion gap 11 5 - 15    Comment: Performed at [facility], [address] [place], [place] [address]  CBC     Status: None   Collection Time: 03/22/24  4:36 AM  Result Value Ref Range   WBC 6.5 4.0 - 10.5 K/uL   RBC 5.71 4.22 - 5.81 MIL/uL   Hemoglobin 16.4 13.0 - 17.0 g/dL   HCT 51.4 60.9 - 47.9 %   MCV 84.9 80.0 - 100.0 fL   MCH 28.7 26.0 - 34.0 pg   MCHC 33.8 30.0 - 36.0 g/dL   RDW 85.7 88.4 - 84.4 %   Platelets 203 150 - 400 K/uL   nRBC 0.0 0.0 - 0.2 %    Comment: Performed at [facility], [address], [place], [place] [address]  Glucose, capillary     Status: Abnormal   Collection Time: 03/22/24  7:57 AM  Result Value Ref Range   Glucose-Capillary 138 (H) 70 - 99 mg/dL    Comment: Glucose reference range applies only to samples taken after fasting for at least 8 hours.  Glucose, capillary     Status: Abnormal   Collection Time: 03/22/24 11:51 AM  Result Value Ref Range   Glucose-Capillary 138 (H) 70 - 99 mg/dL    Comment: Glucose reference range applies only to samples taken after fasting for at least 8 hours.  Glucose, capillary     Status: Abnormal   Collection Time: 03/22/24  4:49 PM  Result Value Ref Range   Glucose-Capillary 147 (H) 70 - 99 mg/dL    Comment: Glucose reference range applies only to samples taken after fasting for at least 8 hours.  Glucose, capillary     Status: Abnormal   Collection Time: 03/22/24  9:17 PM  Result Value Ref Range   Glucose-Capillary 145 (H) 70 - 99 mg/dL    Comment: Glucose reference range applies only to samples taken after fasting for at least 8 hours.   Comment 1 Notify RN    Comment 2 Document in Chart   Glucose, capillary     Status: Abnormal   Collection Time: 03/23/24  7:43 AM  Result Value Ref Range   Glucose-Capillary 123 (H) 70 - 99 mg/dL    Comment: Glucose reference range applies only to samples taken after fasting for at least  8 hours.  Glucose, capillary     Status: Abnormal   Collection Time: 03/23/24 11:35 AM  Result Value Ref Range   Glucose-Capillary 166 (H) 70 - 99 mg/dL    Comment: Glucose reference range applies only to samples taken after fasting for at least 8 hours.  Glucose, capillary     Status: Abnormal   Collection Time: 03/23/24  5:14 PM  Result Value Ref Range   Glucose-Capillary 144 (H) 70 - 99 mg/dL    Comment: Glucose reference range applies only to samples taken after fasting for at least 8 hours.  Glucose, capillary     Status: Abnormal  Collection Time: 03/23/24  8:55 PM  Result Value Ref Range   Glucose-Capillary 154 (H) 70 - 99 mg/dL    Comment: Glucose reference range applies only to samples taken after fasting for at least 8 hours.   Comment 1 Notify RN    Comment 2 Document in Chart   Glucose, capillary     Status: Abnormal   Collection Time: 03/24/24  7:19 AM  Result Value Ref Range   Glucose-Capillary 117 (H) 70 - 99 mg/dL    Comment: Glucose reference range applies only to samples taken after fasting for at least 8 hours.  Glucose, capillary     Status: Abnormal   Collection Time: 03/24/24 10:55 AM  Result Value Ref Range   Glucose-Capillary 153 (H) 70 - 99 mg/dL    Comment: Glucose reference range applies only to samples taken after fasting for at least 8 hours.  Glucose, capillary     Status: Abnormal   Collection Time: 03/24/24  4:54 PM  Result Value Ref Range   Glucose-Capillary 117 (H) 70 - 99 mg/dL    Comment: Glucose reference range applies only to samples taken after fasting for at least 8 hours.   Comment 1 Notify RN   Glucose, capillary     Status: Abnormal   Collection Time: 03/24/24  9:19 PM  Result Value Ref Range   Glucose-Capillary 147 (H) 70 - 99 mg/dL    Comment: Glucose reference range applies only to samples taken after fasting for at least 8 hours.   Comment 1 Notify RN   Glucose, capillary     Status: Abnormal   Collection Time: 03/25/24  7:15 AM   Result Value Ref Range   Glucose-Capillary 120 (H) 70 - 99 mg/dL    Comment: Glucose reference range applies only to samples taken after fasting for at least 8 hours.  Glucose, capillary     Status: Abnormal   Collection Time: 03/25/24 12:11 PM  Result Value Ref Range   Glucose-Capillary 142 (H) 70 - 99 mg/dL    Comment: Glucose reference range applies only to samples taken after fasting for at least 8 hours.  Comprehensive metabolic panel with GFR     Status: Abnormal   Collection Time: 03/26/24  4:36 AM  Result Value Ref Range   Sodium 137 135 - 145 mmol/L   Potassium 3.8 3.5 - 5.1 mmol/L   Chloride 100 98 - 111 mmol/L   CO2 26 22 - 32 mmol/L   Glucose, Bld 104 (H) 70 - 99 mg/dL    Comment: Glucose reference range applies only to samples taken after fasting for at least 8 hours.   BUN 13 6 - 20 mg/dL   Creatinine, Ser 9.16 0.61 - 1.24 mg/dL   Calcium  9.7 8.9 - 10.3 mg/dL   Total Protein 7.2 6.5 - 8.1 g/dL   Albumin  4.0 3.5 - 5.0 g/dL   AST 24 15 - 41 U/L   ALT 29 0 - 44 U/L   Alkaline Phosphatase 78 38 - 126 U/L   Total Bilirubin 0.8 0.0 - 1.2 mg/dL   GFR, Estimated >39 >39 mL/min    Comment: (NOTE) Calculated using the CKD-EPI Creatinine Equation (2021)    Anion gap 12 5 - 15    Comment: Performed at [facility], [address], [place], [place] [address]  CBC     Status: Abnormal   Collection Time: 03/26/24  4:36 AM  Result Value Ref Range   WBC 7.9 4.0 -  10.5 K/uL   RBC 6.00 (H) 4.22 - 5.81 MIL/uL   Hemoglobin 17.3 (H) 13.0 - 17.0 g/dL   HCT 48.2 60.9 - 47.9 %   MCV 86.2 80.0 - 100.0 fL   MCH 28.8 26.0 - 34.0 pg   MCHC 33.5 30.0 - 36.0 g/dL   RDW 85.9 88.4 - 84.4 %   Platelets 176 150 - 400 K/uL   nRBC 0.0 0.0 - 0.2 %    Comment: Performed at [facility], [address], [place], [place] [address]  Phosphorus     Status: None   Collection Time: 03/26/24  4:36 AM  Result Value Ref Range   Phosphorus 3.3 2.5 - 4.6 mg/dL     Comment: Performed at [facility], [address], [place], [place] [address]  Magnesium      Status: None   Collection Time: 03/26/24  4:36 AM  Result Value Ref Range   Magnesium  1.9 1.7 - 2.4 mg/dL    Comment: Performed at [facility], [address], [place], [place] [address]  Valproic acid  level     Status: None   Collection Time: 03/28/24  5:25 PM  Result Value Ref Range   Valproic Acid  Lvl 79 50 - 100 ug/mL    Comment: Performed at [facility], [address], [place], [place] [address]  Glucose, capillary     Status: Abnormal   Collection Time: 03/28/24  7:50 PM  Result Value Ref Range   Glucose-Capillary 138 (H) 70 - 99 mg/dL    Comment: Glucose reference range applies only to samples taken after fasting for at least 8 hours.  Renal function panel     Status: None   Collection Time: 03/31/24  5:04 AM  Result Value Ref Range   Sodium 137 135 - 145 mmol/L   Potassium 3.5 3.5 - 5.1 mmol/L   Chloride 100 98 - 111 mmol/L   CO2 27 22 - 32 mmol/L   Glucose, Bld 86 70 - 99 mg/dL    Comment: Glucose reference range applies only to samples taken after fasting for at least 8 hours.   BUN 13 6 - 20 mg/dL   Creatinine, Ser 9.03 0.61 - 1.24 mg/dL   Calcium  9.4 8.9 - 10.3 mg/dL   Phosphorus 2.8 2.5 - 4.6 mg/dL   Albumin  3.6 3.5 - 5.0 g/dL   GFR, Estimated >39 >39 mL/min    Comment: (NOTE) Calculated using the CKD-EPI Creatinine Equation (2021)    Anion gap 10 5 - 15    Comment: Performed at [facility], [address] [place], [place] [address]  Magnesium      Status: None   Collection Time: 03/31/24  5:04 AM  Result Value Ref Range   Magnesium  2.0 1.7 - 2.4 mg/dL    Comment: Performed at [facility], [address], [place], [place] [address]  CBC     Status: None   Collection Time: 03/31/24  5:04 AM  Result Value Ref Range   WBC 6.4 4.0 - 10.5 K/uL   RBC 5.53 4.22 - 5.81 MIL/uL    Hemoglobin 15.8 13.0 - 17.0 g/dL   HCT 52.0 60.9 - 47.9 %   MCV 86.6 80.0 - 100.0 fL   MCH 28.6 26.0 - 34.0 pg   MCHC 33.0 30.0 - 36.0 g/dL   RDW 85.8 88.4 - 84.4 %   Platelets 190 150 - 400 K/uL   nRBC 0.0 0.0 -  0.2 %    Comment: Performed at [facility], [address], [place], [place] [address]  CK     Status: None   Collection Time: 03/31/24  5:04 AM  Result Value Ref Range   Total CK 58 49 - 397 U/L    Comment: Performed at [facility], [address], [place], [place] [address]  Valproic acid  level     Status: None   Collection Time: 04/03/24  4:24 AM  Result Value Ref Range   Valproic Acid  Lvl 76 50 - 100 ug/mL    Comment: Performed at [facility], [address] [place], [place] [address]      ## Disposition:-- There are no psychiatric contraindications to discharge at this time  ## Behavioral / Environmental: -Delirium Precautions: Delirium Interventions for Nursing and Staff: - RN to open blinds every AM. - To Bedside: Glasses, hearing aide, and pt's own shoes. Make available to patients. when possible and encourage use. - Encourage po fluids when appropriate, keep fluids within reach. - OOB to chair with meals. - Passive ROM exercises to all extremities with AM & PM care. - RN to assess orientation to person, time and place QAM and PRN. - Recommend extended visitation hours with familiar family/friends as feasible. - Staff to minimize disturbances at night. Turn off television when pt asleep or when not in use.    ## Safety and Observation Level:  - Based on my clinical evaluation, I estimate the patient to be at minimal risk of self harm in the current setting. - At this time, we recommend  1:1 Observation. This decision is based on my review of the chart including patient's history and current presentation, interview of the patient, mental status examination, and consideration of suicide risk including  evaluating suicidal ideation, plan, intent, suicidal or self-harm behaviors, risk factors, and protective factors. This judgment is based on our ability to directly address suicide risk, implement suicide prevention strategies, and develop a safety plan while the patient is in the clinical setting. Please contact our team if there is a concern that risk level has changed.  CSSR Risk Category:C-SSRS RISK CATEGORY: No Risk  Suicide Risk Assessment: Patient has following modifiable risk factors for suicide: medication noncompliance, which we are addressing by currently in the hospital receiving medications but will need safe discharge plan. Patient has following non-modifiable or demographic risk factors for suicide: male gender Patient has the following protective factors against suicide: NA  Thank you for this consult request. Recommendations have been communicated to the primary team.  We will sign off at this time.   Majel GORMAN Ramp, [name]       History of Present Illness  Patient Report:  Patient reports no current concerns. States, "I'm finished with this here," referring to his meal. He greeted the provider appropriately, asking, "How are you doing?" and was observed attempting to mute the television but accidentally pressed the nurse call button, stating, "Yeah, can you make all this beeping stop?"  Collateral information:  Sari Darlis Benne [phone] Called twice and left message  Review of Systems  Psychiatric/Behavioral:  Positive for memory loss.   All other systems reviewed and are negative.    Psychiatric and Social History  Psychiatric History:  Information collected from Patient  Prev Dx/Sx: Bipolar Disorder Current Psych Provider: [facility] Home Meds (current): Cymbalta  and Depakote  Previous Med Trials: Cymablta, Depakote , Ingrezza  Therapy: UTA  Prior Psych Hospitalization: Yes  Prior Self Harm:  Denies Prior Violence: Yes  Family Psych History: [facility] Family  Hx suicide: UTA  Social History:  Developmental Hx: Unremarkable Educational Hx: UTA Occupational Hx: Unemployed Legal Hx: UTA Living Situation: Homeless Spiritual Hx: UTA Access to weapons/lethal means: Denies   Substance History Alcohol : Denies  Tobacco: Denies  Illicit drugs: Denies  Prescription drug abuse: Denies  Rehab hx: Denies   Exam Findings  Physical Exam:  Vital Signs:  Temp:  [97.5 F (36.4 C)-98.4 F (36.9 C)] 97.5 F (36.4 C) (11/25 1325) Pulse Rate:  [63-86] 63 (11/25 1325) Resp:  [15-18] 18 (11/25 1325) BP: (95-117)/(56-93) 95/56 (11/25 1325) SpO2:  [94 %-96 %] 95 % (11/25 1325) Blood pressure (!) 95/56, pulse 63, temperature (!) 97.5 F (36.4 C), resp. rate 18, height 5' 10 (1.778 m), weight 83 kg, SpO2 95%. Body mass index is 26.26 kg/m.  Physical Exam Vitals and nursing note reviewed.  Constitutional:      Appearance: Normal appearance. He is normal weight.  Neurological:     General: No focal deficit present.     Mental Status: He is alert and oriented to person, place, and time. Mental status is at baseline.  Psychiatric:        Attention and Perception: He is inattentive.        Mood and Affect: Mood and affect normal.        Speech: Speech is delayed.        Behavior: Behavior is slowed. Behavior is cooperative.        Cognition and Memory: Cognition and memory normal.        Judgment: Judgment is impulsive and inappropriate.     Mental Status Exam: General Appearance: Casual  Orientation:  Person, place, and time disoriented to situation  Memory:  Fair  Concentration:  Fair  Recall: Fair  Attention  Fair  Eye Contact:  Good  Speech:  WDL  Language:  Fair  Volume:  WDL  Mood: denies depression and anxiety  Affect:  appropriate  Thought Process:  logical  Thought Content:  WDL  Suicidal Thoughts:  No  Homicidal Thoughts:  No  Judgement:  Fair  Insight:  Poor  Psychomotor Activity:  Tremor  Akathisia:  No  Fund of  Knowledge:  Estimated to be below average      Assets:  None  Cognition:  Impaired,  Moderate  ADL's:  Impaired  AIMS (if indicated):        Other History   These have been pulled in through the EMR, reviewed, and updated if appropriate.  Family History:  The patient's family history includes Anxiety disorder in his father and mother; Cancer in his paternal grandfather; Depression in his brother; Diabetes in his father; Heart attack in his maternal grandfather; Heart disease in his mother; Heart failure in his paternal grandmother; Kidney failure in his father; Mental illness in his sister; Thyroid  disease in his brother and sister.  Medical History: Past Medical History:  Diagnosis Date   Anxiety    Arthritis    Chest heaviness    Chills    Depression, major    Diabetes mellitus without complication (HCC)    Dizzy    Dyslipidemia    ED (erectile dysfunction)    Elevated CPK    GERD (gastroesophageal reflux disease)    HA (headache)    Hypertension    Hypertriglyceridemia    Insomnia    Kidney stone    Left-sided Bell's palsy    Morbid obesity (HCC)  Nausea    No appetite    Panniculitis    of right lower leg   Parkinson's disease (HCC)    PTSD (post-traumatic stress disorder)    Routine screening for STI (sexually transmitted infection) 01/05/2023   Weakness     Surgical History: Past Surgical History:  Procedure Laterality Date   NO PAST SURGERIES     RADIOLOGY WITH ANESTHESIA N/A 03/13/2024   Procedure: MRI WITH ANESTHESIA;  Surgeon: Radiologist, Medication, MD;  Location: [facility] OR;  Service: Radiology;  Laterality: N/A;     Medications:   Current Facility-Administered Medications:    acetaminophen  (TYLENOL ) tablet 650 mg, 650 mg, Oral, Q6H PRN, 650 mg at 03/30/24 2123 **OR** acetaminophen  (TYLENOL ) suppository 650 mg, 650 mg, Rectal, Q6H PRN, Lou Claretta HERO, MD   amLODipine  (NORVASC ) tablet 5 mg, 5 mg, Oral, Daily, Gonfa, Taye T, MD, 5 mg at 04/03/24  1014   artificial tears ophthalmic solution 1 drop, 1 drop, Both Eyes, PRN, Regalado, Belkys A, MD   atorvastatin  (LIPITOR) tablet 20 mg, 20 mg, Oral, Daily, Mannie Pac T, DO, 20 mg at 04/03/24 1015   benzocaine  (ORAJEL) 10 % mucosal gel, , Mouth/Throat, QID PRN, Ghimire, Kuber, MD, Given at 03/18/24 2210   benztropine  (COGENTIN ) tablet 1 mg, 1 mg, Oral, BID, Mannie Pac T, DO, 1 mg at 04/03/24 1016   bisacodyl  (DULCOLAX) EC tablet 5 mg, 5 mg, Oral, Daily PRN, Lou Claretta HERO, MD, 5 mg at 03/22/24 2100   divalproex  (DEPAKOTE ) DR tablet 625 mg, 625 mg, Oral, Q8H, Starkes-Perry, Joneisha Miles S, [name], 625 mg at 04/03/24 1324   DULoxetine  (CYMBALTA ) DR capsule 40 mg, 40 mg, Oral, BID, Mannie Pac T, DO, 40 mg at 04/03/24 1124   enoxaparin  (LOVENOX ) injection 40 mg, 40 mg, Subcutaneous, Q24H, Amponsah, Claretta HERO, MD, 40 mg at 04/03/24 1013   feeding supplement (ENSURE PLUS HIGH PROTEIN) liquid 237 mL, 237 mL, Oral, BID BM, Gonfa, Taye T, MD, 237 mL at 03/30/24 1011   haloperidol  (HALDOL ) tablet 5 mg, 5 mg, Oral, Q8H PRN **OR** haloperidol  lactate (HALDOL ) injection 5 mg, 5 mg, Intravenous, Q8H PRN, Lord, Sharlot GRADE, NP   hydrOXYzine  (ATARAX ) tablet 50 mg, 50 mg, Oral, QHS, Regalado, Belkys A, MD, 50 mg at 04/02/24 2131   lactated ringers  bolus 500 mL, 500 mL, Intravenous, Daily, Gonfa, Taye T, MD, Last Rate: 500 mL/hr at 04/03/24 1019, 500 mL at 04/03/24 1019   lip balm (CARMEX) ointment, , Topical, PRN, Regalado, Belkys A, MD, 1 Application at 03/12/24 0934   metFORMIN  (GLUCOPHAGE ) tablet 500 mg, 500 mg, Oral, BID WC, Gonfa, Taye T, MD, 500 mg at 04/03/24 1014   multivitamin with minerals tablet 1 tablet, 1 tablet, Oral, Daily, Gonfa, Taye T, MD, 1 tablet at 04/03/24 1014   ondansetron  (ZOFRAN ) tablet 4 mg, 4 mg, Oral, Q6H PRN **OR** ondansetron  (ZOFRAN ) injection 4 mg, 4 mg, Intravenous, Q6H PRN, Lou, Claretta HERO, MD   Oral care mouth rinse, 15 mL, Mouth Rinse, PRN, Gonfa, Taye T, MD    propranolol  (INDERAL ) tablet 10 mg, 10 mg, Oral, BID, Gonfa, Taye T, MD, 10 mg at 04/03/24 1015   senna-docusate (Senokot-S) tablet 1 tablet, 1 tablet, Oral, QHS PRN, Lou Claretta HERO, MD   traZODone  (DESYREL ) tablet 50 mg, 50 mg, Oral, QHS PRN, Jacquetta Sharlot GRADE, NP, 50 mg at 04/01/24 2157   valbenazine  (INGREZZA ) capsule 80 mg, 80 mg, Oral, Daily, Mannie Pac T, DO, 80 mg at 04/03/24 1015  Allergies: No Known Allergies  Majel GORMAN Ramp, [name]

## 2024-04-03 NOTE — TOC Progression Note (Signed)
 Transition of Care [facility]) - Progression Note    Patient Details  Name: [name] [name] MRN: [id number] Date of Birth: Apr 24, 1966  [facility] CM/SW Contact  Doneta Glenys DASEN, RN Phone Number: 04/03/2024, 11:17 AM  Clinical Narrative:    CM received call back from [facility]  requesting a email with the progress note for capasity. CM email progress note to [email or web address].     Barriers to Discharge: SNF Pending bed offer               Expected Discharge Plan and Services                                               Social Drivers of Health (SDOH) Interventions SDOH Screenings   Food Insecurity: Patient Unable To Answer (03/07/2024)  Housing: Unknown (03/07/2024)  Transportation Needs: Patient Unable To Answer (03/07/2024)  Utilities: Patient Unable To Answer (03/07/2024)  Depression (PHQ2-9): High Risk (01/26/2024)  Social Connections: Unknown (09/22/2021)   Received from [facility]  Tobacco Use: Low Risk  (03/13/2024)    Readmission Risk Interventions     No data to display

## 2024-04-03 NOTE — Progress Notes (Signed)
 Occupational Therapy Treatment Patient Details Name: [name] [name] MRN: [id number] DOB: Mar 08, 1966 Today's Date: 04/03/2024   History of present illness Pt is a 58 yr old male presenting on 10/24 for fall and chronic back pain, change in mental status due to acute delirium, and acute metabolic encephalopathy. PMH includes: bipolar disorder type 1, tardive dyskinesia, PTSD, MDD, CAD, DM2, OA, tremors.   OT comments  The pt required min assist for supine to sit, sit to stand using a RW, and to step-pivot to the bedside chair using  RW. He further required set-up assist to perform hand washing and initiation of self-feeding seated in the chair. He denied having pain, he was calm and cooperative, and his orientation was improved to today, as he was able to correctly state his name, birthday, the year, and that he is currently in the hospital. He is making gradual functional progress. Continue OT plan of care. Patient will benefit from continued inpatient follow up therapy, <3 hours/day.       If plan is discharge home, recommend the following:  A little help with walking and/or transfers;Direct supervision/assist for medications management;Help with stairs or ramp for entrance;Assistance with cooking/housework;A lot of help with bathing/dressing/bathroom;Direct supervision/assist for financial management   Equipment Recommendations  BSC/3in1    Recommendations for Other Services      Precautions / Restrictions Precautions Precautions: Fall Restrictions Other Position/Activity Restrictions: chronic back pain, history of Parkinson's disease       Mobility Bed Mobility Overal bed mobility: Needs Assistance Bed Mobility: Supine to Sit     Supine to sit: Min assist Sit to supine: HOB elevated, Used rails   General bed mobility comments: assist to raise trunk    Transfers Overall transfer level: Needs assistance Equipment used: Rolling walker (2 wheels) Transfers: Sit to/from  Stand, Bed to chair/wheelchair/BSC Sit to Stand: Min assist     Step pivot transfers: Min assist           Balance       Sitting balance - Comments: static sitting-good. dynamic sitting-fair+       Standing balance comment: CGA to min assist to step-pivot to the bedside chair using a RW       ADL either performed or assessed with clinical judgement   ADL Overall ADL's : Needs assistance/impaired Eating/Feeding: Set up;Sitting Eating/Feeding Details (indicate cue type and reason): He required assist to open packets, remove lids, and cut lids, given intermittent BUE tremulous presentation and difficulty with fine motor tasks. Grooming: Supervision/safety;Set up;Sitting Grooming Details (indicate cue type and reason): He performed hand washing seated in the bedside chair.             Lower Body Dressing: Maximal assistance;Sitting/lateral leans Lower Body Dressing Details (indicate cue type and reason): Assist needed to donn both socks.                      Cognition Arousal: Alert Behavior During Therapy: WFL for tasks assessed/performed, Flat affect           Attention impairment (select first level of impairment): Divided attention   OT - Cognition Comments: Oriented to person, place, and year.                 Following commands: Impaired Following commands impaired: Follows one step commands with increased time      Cueing   Cueing Techniques: Verbal cues, Gestural cues  Pertinent Vitals/ Pain       Pain Assessment Pain Assessment: No/denies pain   Frequency  Min 2X/week        Progress Toward Goals  OT Goals(current goals can now be found in the care plan section)  Progress towards OT goals: Progressing toward goals  Acute Rehab OT Goals Patient Stated Goal: to go home OT Goal Formulation: With patient Time For Goal Achievement: 04/08/24 Potential to Achieve Goals: Good  Plan         AM-PAC OT 6 Clicks  Daily Activity     Outcome Measure   Help from another person eating meals?: A Little Help from another person taking care of personal grooming?: A Little Help from another person toileting, which includes using toliet, bedpan, or urinal?: A Lot Help from another person bathing (including washing, rinsing, drying)?: A Lot Help from another person to put on and taking off regular upper body clothing?: A Little Help from another person to put on and taking off regular lower body clothing?: A Lot 6 Click Score: 15    End of Session Equipment Utilized During Treatment: Rolling walker (2 wheels)  OT Visit Diagnosis: Other abnormalities of gait and mobility (R26.89);Unsteadiness on feet (R26.81);Muscle weakness (generalized) (M62.81);History of falling (Z91.81);Other symptoms and signs involving cognitive function   Activity Tolerance Patient tolerated treatment well   Patient Left in chair;with call bell/phone within reach;with chair alarm set   Nurse Communication Mobility status        Time: 1003-1015 OT Time Calculation (min): 12 min  Charges: OT General Charges $OT Visit: 1 Visit OT Treatments $Therapeutic Activity: 8-22 mins     [name], OTR/L 04/03/2024, 11:01 AM

## 2024-04-03 NOTE — Plan of Care (Signed)
  Problem: Clinical Measurements: Goal: Respiratory complications will improve Outcome: Progressing Goal: Cardiovascular complication will be avoided Outcome: Progressing   Problem: Coping: Goal: Level of anxiety will decrease Outcome: Progressing   Problem: Elimination: Goal: Will not experience complications related to urinary retention Outcome: Progressing   Problem: Pain Managment: Goal: General experience of comfort will improve and/or be controlled Outcome: Progressing   Problem: Safety: Goal: Ability to remain free from injury will improve Outcome: Progressing   Problem: Skin Integrity: Goal: Risk for impaired skin integrity will decrease Outcome: Progressing   Problem: Skin Integrity: Goal: Risk for impaired skin integrity will decrease Outcome: Progressing

## 2024-04-03 NOTE — Progress Notes (Signed)
 [facility]                                                                              [name] [name], is a 58 y.o. male, DOB - [birth year], FMW:[id number] Admit date - 03/02/2024    Outpatient Primary MD for the patient is [name], [name]  LOS - 27  days  Chief Complaint  Patient presents with   Placement       Brief summary   Patient is a 58 year old male with bipolar disorder type I, tardive dyskinesia, PTSD, MDD, GAD, DM-2, tremor, frequent falls and HLD brought to ED by EMS on 10/18 due to talking crazy.    Per EDP note, roommate/friend reported problems with the patient having frequent falls in the house, difficulty caring for himself, erratic behavior including today where he had a very loud outburst in front of her, calling her names, and then was overheard stating he may kill himself.  She is concerned because he has access to 2 guns in the house.    Patient was boarded in the emergency room since 10/18 10/19, psychiatry consulted as patient was endorsing suicidal thoughts-IVC 10/22, psychiatry cleared for discharge. 10/24, patient was noted to be encephalopathic so admitted to the hospital.  11/4, MRI under sedation, transient hypotension and altered mental status-overnight in ICU. 11/5, clinically improved.  Sedation wore off.  Transferred back to MedSurg bed.     Pending safe discharge planning.   Has not demonstrated capacity to make his own medical decision. Patient has no immediate family members.  APS involved.  Two-physician DNR on 03/27/2024 after discussion with palliative medicine given patient's multiple medical comorbidity.  Psychiatry reengaged to exhaust treatment option.   Awaiting placement  Assessment & Plan    Acute metabolic encephalopathy in a patient with underlying psychiatric disorder:  - Multifactorial including positive underlying cognitive impairment, dehydration, delirium and iatrogenic.   - CT head and MRI brain  without acute finding to explain patient's behavioral issue or encephalopathy. -- TSH, B12, EEG x 3 unrevealing. Low suspicion for infection.  - remains combative, aggressive and unpredictable at times.  He is imminent danger to self and staff. - Continue delirium precautions, fall precautions, restraints, psychiatry following. - No acute issues  Bipolar 1 disorder/MDD/GAD/PTSD:  - remains combative, aggressive and kicking and hitting staff at times even with the adjustment of his psych medications by psychiatry.  Currently requiring restraints.   -Continue Cogentin  1 mg bid, Atarax  50 mg qhs, Ingrezza  80mg  daily, Cymbalta  40 mg bid  -Psychiatry increase Depakote  from 750 mg twice daily to 625 mg 3 times daily.  Depakote  level was normal at 79. -Continue trazodone  and Haldol  as needed. -Palliative and psychiatry following.     Medical decision making capacity: -  Oriented to self and place for most part but not time.  Has no insight into his medical condition and treatment options.  Has been intermittently combative, aggressive and unpredictable at times.  Overall, he has not demonstrated a capacity to make his own medical decision during my evaluations.  - Per psychiatry evaluation, on 11/23, patient does not have capacity at this  time for medical decisions.    Hypotension 2/2 sedation: Resolved. Essential hypertension: Normotensive. - BP stable, continue amlodipine  5 mg daily, propranolol  10 mg twice daily  -Continue holding lisinopril  due to risk for AKI.   Diabetes mellitus type 2, NIDDM  A1c 6.3%.  CBG within acceptable range.  -Continue low-dose metformin .   Physical deconditioning/recurrent falls  -Therapy recommended SNF.   Hypoxemia:  - Resolved.  On room air.   Hyperlipidemia - Continue statin.   Vitamin D  deficiency: -Continue vitamin D  supplementation   Dehydration: dry and chapped lips - IV fluid bolus intermittently.   Goal of care:  - Poor long-term prognosis  due to underlying complex mental health and very limited social support.  He has not demonstrated capacity to make his own medical decisions so far.  Has no immediate family member support.  Reportedly, only one estranged brother that does not want to talk to him. Lived with friend, [name] before this admission. Two person DNR after discussion with palliative medicine on 11/18. -Palliative medicine and psychiatry following. - APS involved for guardianship.   Mild to moderate protein calorie malnutrition Nutrition Problem: Increased nutrient needs Etiology: acute illness Signs/Symptoms: estimated needs  Interventions: Ensure Enlive (each supplement provides 350kcal and 20 grams of protein), MVI  Estimated body mass index is 26.26 kg/m as calculated from the following:   Height as of this encounter: 5' 10 (1.778 m).   Weight as of this encounter: 83 kg.  Code Status: DNR DVT Prophylaxis:  enoxaparin  (LOVENOX ) injection 40 mg Start: 03/08/24 0800   Level of Care: Level of care: Med-Surg Family Communication: Disposition Plan:      Remains inpatient appropriate: Awaiting placement   Procedures:    Consultants:   Psychiatry  Antimicrobials:   Anti-infectives (From admission, onward)    Start     Dose/Rate Route Frequency Ordered Stop   03/12/24 1515  amoxicillin -clavulanate (AUGMENTIN ) 875-125 MG per tablet 1 tablet        1 tablet Oral Every 12 hours 03/12/24 1420 03/17/24 0959   03/12/24 1515  doxycycline  (VIBRA -TABS) tablet 100 mg  Status:  Discontinued        100 mg Oral Every 12 hours 03/12/24 1420 03/15/24 1100          Medications  amLODipine   5 mg Oral Daily   atorvastatin   20 mg Oral Daily   benztropine   1 mg Oral BID   divalproex   625 mg Oral Q8H   DULoxetine   40 mg Oral BID   enoxaparin  (LOVENOX ) injection  40 mg Subcutaneous Q24H   feeding supplement  237 mL Oral BID BM   hydrOXYzine   50 mg Oral QHS   metFORMIN   500 mg Oral BID WC   multivitamin with  minerals  1 tablet Oral Daily   propranolol   10 mg Oral BID   valbenazine   80 mg Oral Daily      Subjective:   [name] [name] was seen and examined today.  Resting comfortably, no acute issues.   No pain, active nausea vomiting, chest pain or shortness of breath.  Objective:   Vitals:   04/02/24 2137 04/03/24 0538 04/03/24 1014 04/03/24 1325  BP: 109/69 117/77 (!) 110/93 (!) 95/56  Pulse: 64 67 86 63  Resp:    18  Temp:  (!) 97.5 F (36.4 C)  (!) 97.5 F (36.4 C)  TempSrc:  Oral    SpO2:  94%  95%  Weight:      Height:  Intake/Output Summary (Last 24 hours) at 04/03/2024 1327 Last data filed at 04/03/2024 1045 Gross per 24 hour  Intake 200 ml  Output 1950 ml  Net -1750 ml     Wt Readings from Last 3 Encounters:  03/26/24 83 kg  02/25/24 93 kg  01/26/24 93 kg    Physical Exam General: Alert and oriented, NAD Cardiovascular: S1 S2 clear, RRR.  Respiratory: CTAB, no wheezing Gastrointestinal: Soft, nontender, nondistended, NBS Ext: no pedal edema bilaterally Neuro: Following commands Psych: Flat affect     Data Reviewed:  I have personally reviewed following labs    CBC Lab Results  Component Value Date   WBC 6.4 03/31/2024   RBC 5.53 03/31/2024   HGB 15.8 03/31/2024   HCT 47.9 03/31/2024   MCV 86.6 03/31/2024   MCH 28.6 03/31/2024   PLT 190 03/31/2024   MCHC 33.0 03/31/2024   RDW 14.1 03/31/2024   LYMPHSABS 2.7 03/07/2024   MONOABS 0.8 03/07/2024   EOSABS 0.1 03/07/2024   BASOSABS 0.1 03/07/2024     Last metabolic panel Lab Results  Component Value Date   NA 137 03/31/2024   K 3.5 03/31/2024   CL 100 03/31/2024   CO2 27 03/31/2024   BUN 13 03/31/2024   CREATININE 0.96 03/31/2024   GLUCOSE 86 03/31/2024   GFRNONAA >60 03/31/2024   GFRAA >60 12/26/2019   CALCIUM  9.4 03/31/2024   PHOS 2.8 03/31/2024   PROT 7.2 03/26/2024   ALBUMIN  3.6 03/31/2024   LABGLOB 2.4 09/01/2022   AGRATIO 1.8 09/01/2022   BILITOT 0.8 03/26/2024    ALKPHOS 78 03/26/2024   AST 24 03/26/2024   ALT 29 03/26/2024   ANIONGAP 10 03/31/2024    CBG (last 3)  No results for input(s): GLUCAP in the last 72 hours.    Coagulation Profile: No results for input(s): INR, PROTIME in the last 168 hours.   Radiology Studies: I have personally reviewed the imaging studies  No results found.     Nydia Distance M.D. [facility] 04/03/2024, 1:27 PM  Available via Epic secure chat 7am-7pm After 7 pm, please refer to night coverage provider listed on amion.

## 2024-04-04 DIAGNOSIS — G9340 Encephalopathy, unspecified: Secondary | ICD-10-CM | POA: Diagnosis not present

## 2024-04-04 DIAGNOSIS — E782 Mixed hyperlipidemia: Secondary | ICD-10-CM | POA: Diagnosis not present

## 2024-04-04 DIAGNOSIS — M545 Low back pain, unspecified: Secondary | ICD-10-CM | POA: Diagnosis not present

## 2024-04-04 DIAGNOSIS — G2401 Drug induced subacute dyskinesia: Secondary | ICD-10-CM | POA: Diagnosis not present

## 2024-04-04 NOTE — Plan of Care (Signed)

## 2024-04-04 NOTE — Plan of Care (Signed)
  Problem: Health Behavior/Discharge Planning: Goal: Ability to manage health-related needs will improve Outcome: Progressing   Problem: Clinical Measurements: Goal: Ability to maintain clinical measurements within normal limits will improve Outcome: Progressing   Problem: Activity: Goal: Risk for activity intolerance will decrease Outcome: Progressing   Problem: Nutrition: Goal: Adequate nutrition will be maintained Outcome: Progressing   Problem: Safety: Goal: Ability to remain free from injury will improve Outcome: Progressing

## 2024-04-04 NOTE — Progress Notes (Signed)
 Nutrition Follow-up  DOCUMENTATION CODES:   Not applicable  INTERVENTION:  - Carb Modified diet.  - Magic cup BID with meals, each supplement provides 290 kcal and 9 grams of protein - Encourage intake at all meals and of supplements.  - Multivitamin with minerals daily.  - Monitor weight trends.   - Patient awaiting placement. Will sign off at this time. Please re-consult as needed.   NUTRITION DIAGNOSIS:   Increased nutrient needs related to acute illness as evidenced by estimated needs. *ongoing  GOAL:   Patient will meet greater than or equal to 90% of their needs *likely met  MONITOR:   Supplement acceptance, PO intake, Labs, Weight trends  REASON FOR ASSESSMENT:   Consult Assessment of nutrition requirement/status  ASSESSMENT:   58 y.o. male presented to the ED with altered mental status. PMH includes bipolar disorder, PTSD, T2DM, MDD, GERD, HTN, HLD, and osteoarthritis. Pt admitted with acute encephalopathy.  Patient noted to continue to be aggressive, combative, and per MD notes is an imminent danger to self and staff.  He continues to require restraints.   Per chart review, patient eating fairly well.  Meal Intake 11/15: 100% breakfast, 50% lunch, 100% dinner 11/16: 80% breakfast, 70% lunch, 40% dinner 11/17: 50% breakfast, 15% lunch, dinner not documented 11/18: 50% breakfast, 60% lunch, 0% dinner 11/19: 80% breakfast, 80% lunch, 50% dinner 11/20: 100% breakfast, 5% lunch, 0% dinner 11/21: 0% breakfast, 50% lunch, 50% dinner 11/22: 100% lunch, 100% dinner 11/23;: 100% breakfast, 50% lunch 11/25: 100% breakfast  He is eating an average of 59% of meals. No longer accepting Ensure. Will add Magic Cup BID to support oral intake.   Of note palliative care consulted and has been following. As of 11/18, patient had two-physician DNR order placed. Psychiatry also following. APS now involved to obtain guardianship for patient. He is currently awaiting  placement.  Will sign off at this time.     Medications reviewed and include: Metformin , MVI   Labs reviewed:  No BMP since 11/22 HA1C 6.3 Blood Glucose - not checked since 11/19  Diet Order:   Diet Order             Diet Carb Modified Fluid consistency: Thin; Room service appropriate? Yes  Diet effective now                   EDUCATION NEEDS:  Not appropriate for education at this time  Skin:  Skin Assessment: Reviewed RN Assessment  Last BM:  11/25 - type 6  Height:  Ht Readings from Last 1 Encounters:  03/07/24 5' 10 (1.778 m)   Weight:  Wt Readings from Last 1 Encounters:  03/26/24 83 kg   Ideal Body Weight:  75.5 kg  BMI:  Body mass index is 26.26 kg/m.  Estimated Nutritional Needs:  Kcal:  2100-2300 Protein:  100-120 grams Fluid:  >/= 2 L    Trude Ned RD, LDN Contact via Secure Chat.

## 2024-04-04 NOTE — Progress Notes (Signed)
 Occupational Therapy Treatment Patient Details Name: [name] [name] MRN: [id number] DOB: 1965-06-07 Today's Date: 04/04/2024   History of present illness Pt is a 58 y/o male presenting on 10/24 for fall and chronic back pain. 10/19 psychiatry consulted due to suicidal thoughts, cleared on 10/22. Change in mental status due to acute delirium, and admitted from ED with acute metabolic encephalopathy. CT negative, EEG negative, MRI pending, chest x ray with hypoventilation. PMH includes: bipolar disorder type 1, tardive dyskinesia, PTSD, MDD, CAD, DM2, OA, tremors.   OT comments  Pt willing to work on standing and grooming. Declined ambulating or transfer to chair. Pt completed supine to sit with min assist for trunk. He performed sit to stand x 3 from minimally elevated bed with assist to transfer weight over his feet and verbal cues for hand placement. Pt brushed his teeth with min assist in sitting. Returned to supine without assist. Assisted pt to find favored TV show. Updated pt's nurse of his progress. Patient will benefit from continued inpatient follow up therapy, <3 hours/day       If plan is discharge home, recommend the following:  A little help with walking and/or transfers;A lot of help with bathing/dressing/bathroom;Assistance with cooking/housework;Assistance with feeding;Direct supervision/assist for medications management;Direct supervision/assist for financial management;Assist for transportation;Help with stairs or ramp for entrance   Equipment Recommendations  BSC/3in1    Recommendations for Other Services      Precautions / Restrictions Precautions Precautions: Fall Recall of Precautions/Restrictions: Impaired       Mobility Bed Mobility   Bed Mobility: Supine to Sit, Sit to Supine     Supine to sit: Min assist Sit to supine: Supervision   General bed mobility comments: assist to raise trunk    Transfers Overall transfer level: Needs assistance Equipment  used: Rolling walker (2 wheels) Transfers: Sit to/from Stand Sit to Stand: Min assist           General transfer comment: performed x 3, assist to shift weight over feet, initially stabilizing back of legs on bed     Balance Overall balance assessment: Needs assistance   Sitting balance-Leahy Scale: Good       Standing balance-Leahy Scale: Poor Standing balance comment: CGA to min assist, posterior bias                           ADL either performed or assessed with clinical judgement   ADL Overall ADL's : Needs assistance/impaired Eating/Feeding: Set up;Sitting Eating/Feeding Details (indicate cue type and reason): drinking from cup with straw Grooming: Oral care;Sitting;Minimal assistance Grooming Details (indicate cue type and reason): assist to sequence             Lower Body Dressing: Total assistance;Bed level Lower Body Dressing Details (indicate cue type and reason): assist for socks                    Extremity/Trunk Assessment              Vision       Perception     Praxis     Communication Communication Communication: No apparent difficulties Factors Affecting Communication: Other (comment) (minimally verbal)   Cognition Arousal: Alert Behavior During Therapy: Flat affect Cognition: Cognition impaired   Orientation impairments: Time, Situation Awareness: Intellectual awareness impaired, Online awareness impaired Memory impairment (select all impairments): Short-term memory, Working civil service fast streamer, [employer] Officer functioning impairment (select all impairments): Reasoning, Problem solving,  Organization, Initiation                   Following commands: Impaired Following commands impaired: Follows one step commands with increased time      Cueing   Cueing Techniques: Verbal cues, Gestural cues  Exercises      Shoulder Instructions       General Comments      Pertinent Vitals/  Pain       Pain Assessment Pain Assessment: Faces Faces Pain Scale: No hurt  Home Living                                          Prior Functioning/Environment              Frequency  Min 2X/week        Progress Toward Goals  OT Goals(current goals can now be found in the care plan section)  Progress towards OT goals: Progressing toward goals  Acute Rehab OT Goals OT Goal Formulation: With patient Time For Goal Achievement: 04/08/24 Potential to Achieve Goals: Good  Plan      Co-evaluation                 AM-PAC OT 6 Clicks Daily Activity     Outcome Measure   Help from another person eating meals?: A Little Help from another person taking care of personal grooming?: A Little Help from another person toileting, which includes using toliet, bedpan, or urinal?: A Lot Help from another person bathing (including washing, rinsing, drying)?: A Lot Help from another person to put on and taking off regular upper body clothing?: A Little Help from another person to put on and taking off regular lower body clothing?: Total 6 Click Score: 14    End of Session Equipment Utilized During Treatment: Gait belt;Rolling walker (2 wheels)  OT Visit Diagnosis: Other abnormalities of gait and mobility (R26.89);Unsteadiness on feet (R26.81);Muscle weakness (generalized) (M62.81);History of falling (Z91.81);Other symptoms and signs involving cognitive function   Activity Tolerance Patient tolerated treatment well   Patient Left in bed;with call bell/phone within reach;with bed alarm set   Nurse Communication Mobility status        Time: 1425-1448 OT Time Calculation (min): 23 min  Charges: OT General Charges $OT Visit: 1 Visit OT Treatments $Self Care/Home Management : 8-22 mins $Therapeutic Activity: 8-22 mins Mliss HERO, OTR/L Acute Rehabilitation Services Office: [phone]   [name] 04/04/2024, 3:00 PM

## 2024-04-04 NOTE — Progress Notes (Signed)
  Daily Progress Note   Patient Name: [name] [name]       Date: 04/04/2024 DOB: 19-Aug-1965  Age: 58 y.o. MRN#: [id number] Attending Physician: Davia Nydia POUR, MD Primary Care Physician: [name], [name] Admit Date: 03/02/2024 Length of Stay: 28 days  Reason for Consultation/Follow-up: Establishing goals of care  Subjective:   Resting in bed Deemed to lack capacity as per latest Psychiatry recommendation TOC working with DSS regarding legal guardian designation.   Objective:   Vital Signs:  BP 110/77 (BP Location: Left Arm)   Pulse 61   Temp 98.4 F (36.9 C)   Resp 16   Ht 5' 10 (1.778 m)   Wt 83 kg   SpO2 95%   BMI 26.26 kg/m   Physical Exam: General: Awake Cardiovascular: RRR Respiratory: no increased work of breathing noted, not in respiratory distress Abdomen: not distended Neuro: Awake  Assessment & Plan:   Assessment: Patient is a 58 year old male with a past medical history of type I bipolar disorder, tardive dyskinesia, PTSD, MDD, GAD, tremor, frequent falls, and hyperlipidemia who was brought to the ED on 02/25/2023 by EMS for talking crazy.  Patient's roommate/friend reported patient was having frequent falls, erratic behavior, and difficulty caring for himself at home; roommate had also overheard patient stating that he may kill himself which was concerning since he has access to 2 guns in the house.  Initially patient was endorsing suicidal thoughts and IVC was obtained by psychiatry though psychiatry cleared for discharge on 02/29/2024.  Patient then noted to be encephalopathic and so was admitted to the hospital on 03/02/2024.  Hospitalization further complicated by hypotension requiring ICU admission though has currently resolved.  TOC involved to seek guardianship for patient as no family or friends willing to make decisions on patient's behalf.  Palliative medicine team consulted to assist with complex medical decision making.    Recommendations/Plan: # Complex medical decision making/goals of care:   - Patient unable to participate in complex medical decision making due to underlying medical status.  Patient does not have friends or family willing to make decisions on his behalf.   Psychiatry reinvolved and patient deemed to lack capacity at present. Most recent psychiatry note reviewed.  Agree with seeking guardianship, TOC following.  My colleague Dr Clayton has previously discussed with hospitalist about two-physician DNR based on patient's multiple medical comorbidities.  Continuing medically appropriate and reasonable medical interventions at this time and these are being done according to excepted practice and standards of care.  In the event of cardiac or respiratory arrest, an attempt at resuscitation would be unsuccessful in returning this patient to an already fragile state of health and cause unnecessary suffering in a patient with these multiple underlying medical comorbidities. Support placing a maintaining a DNR order and support the care plan as outlined by the primary service.   # Psycho-social/Spiritual Support:  - TOC seeking guardianship/working with DSS - TOC note reviewed.    # Discharge Planning:  To Be Determined  Thank you for allowing the palliative care team to participate in the care [name].  Mod MDM [name].  Palliative Care Provider PMT # [phone]  If patient remains symptomatic despite maximum doses, please call PMT at [phone] between 0700 and 1900. Outside of these hours, please call attending, as PMT does not have night coverage.

## 2024-04-04 NOTE — Progress Notes (Signed)
 [facility]                                                                              [name] [name], is a 58 y.o. male, DOB - [birth year], FMW:[id number] Admit date - 03/02/2024    Outpatient Primary MD for the patient is [name] Jeoffrey [name], [name]  LOS - 28  days  Chief Complaint  Patient presents with   Placement       Brief summary   Patient is a 58 year old male with bipolar disorder type I, tardive dyskinesia, PTSD, MDD, GAD, DM-2, tremor, frequent falls and HLD brought to ED by EMS on 10/18 due to talking crazy.    Per EDP note, roommate/friend reported problems with the patient having frequent falls in the house, difficulty caring for himself, erratic behavior including today where he had a very loud outburst in front of her, calling her names, and then was overheard stating he may kill himself.  She is concerned because he has access to 2 guns in the house.    Patient was boarded in the emergency room since 10/18 10/19, psychiatry consulted as patient was endorsing suicidal thoughts-IVC 10/22, psychiatry cleared for discharge. 10/24, patient was noted to be encephalopathic so admitted to the hospital.  11/4, MRI under sedation, transient hypotension and altered mental status-overnight in ICU. 11/5, clinically improved.  Sedation wore off.  Transferred back to MedSurg bed.     Pending safe discharge planning.   Has not demonstrated capacity to make his own medical decision. Patient has no immediate family members.  APS involved.  Two-physician DNR on 03/27/2024 after discussion with palliative medicine given patient's multiple medical comorbidity.  Psychiatry reengaged to exhaust treatment option.   Awaiting placement  Assessment & Plan    Acute metabolic encephalopathy in a patient with underlying psychiatric disorder:  - Multifactorial including positive underlying cognitive impairment, dehydration, delirium and iatrogenic.   - CT head and MRI brain  without acute finding to explain patient's behavioral issue or encephalopathy. -- TSH, B12, EEG x 3 unrevealing. Low suspicion for infection.  - remains combative, aggressive and unpredictable at times.  He is imminent danger to self and staff. - Continue delirium precautions, fall precautions, restraints, psychiatry following. - No acute issues  Bipolar 1 disorder/MDD/GAD/PTSD:  - remains combative, aggressive and kicking and hitting staff at times even with the adjustment of his psych medications by psychiatry.  Currently requiring restraints.   -Continue Cogentin  1 mg bid, Atarax  50 mg qhs, Ingrezza  80mg  daily, Cymbalta  40 mg bid  -Psychiatry increase Depakote  from 750 mg twice daily to 625 mg 3 times daily.  Depakote  level was normal at 79. -Continue trazodone  and Haldol  as needed. -Palliative and psychiatry following.     Medical decision making capacity: -  Oriented to self and place for most part but not time.  Has no insight into his medical condition and treatment options.  Has been intermittently combative, aggressive and unpredictable at times.  Overall, he has not demonstrated a capacity to make his own medical decision during my evaluations.  - Per psychiatry evaluation, on 11/23, patient does not have capacity at this  time for medical decisions.    Hypotension 2/2 sedation: Resolved. Essential hypertension: Normotensive. - BP stable, continue amlodipine  5 mg daily, propranolol  10 mg twice daily  -Continue holding lisinopril  due to risk for AKI.   Diabetes mellitus type 2, NIDDM  A1c 6.3%.  CBG within acceptable range.  -Continue low-dose metformin .   Physical deconditioning/recurrent falls  -Therapy recommended SNF.   Hypoxemia:  - Resolved.  On room air.   Hyperlipidemia - Continue statin.   Vitamin D  deficiency: -Continue vitamin D  supplementation   Dehydration: dry and chapped lips - IV fluid bolus intermittently.   Goal of care:  - Poor long-term prognosis  due to underlying complex mental health and very limited social support.  He has not demonstrated capacity to make his own medical decisions so far.  Has no immediate family member support.  Reportedly, only one estranged brother that does not want to talk to him. Lived with friend, [name] [name] before this admission. Two person DNR after discussion with palliative medicine on 11/18. -Palliative medicine and psychiatry following. - APS involved for guardianship.   Mild to moderate protein calorie malnutrition Nutrition Problem: Increased nutrient needs Etiology: acute illness Signs/Symptoms: estimated needs  Interventions: Ensure Enlive (each supplement provides 350kcal and 20 grams of protein), MVI  Estimated body mass index is 26.26 kg/m as calculated from the following:   Height as of this encounter: 5' 10 (1.778 m).   Weight as of this encounter: 83 kg.  Code Status: DNR DVT Prophylaxis:  enoxaparin  (LOVENOX ) injection 40 mg Start: 03/08/24 0800   Level of Care: Level of care: Med-Surg Family Communication: Disposition Plan:      Remains inpatient appropriate: Awaiting placement.  No acute issues   Procedures:    Consultants:   Psychiatry  Antimicrobials:   Anti-infectives (From admission, onward)    Start     Dose/Rate Route Frequency Ordered Stop   03/12/24 1515  amoxicillin -clavulanate (AUGMENTIN ) 875-125 MG per tablet 1 tablet        1 tablet Oral Every 12 hours 03/12/24 1420 03/17/24 0959   03/12/24 1515  doxycycline  (VIBRA -TABS) tablet 100 mg  Status:  Discontinued        100 mg Oral Every 12 hours 03/12/24 1420 03/15/24 1100          Medications  amLODipine   5 mg Oral Daily   atorvastatin   20 mg Oral Daily   benztropine   1 mg Oral BID   divalproex   625 mg Oral Q8H   DULoxetine   40 mg Oral BID   enoxaparin  (LOVENOX ) injection  40 mg Subcutaneous Q24H   feeding supplement  237 mL Oral BID BM   hydrOXYzine   50 mg Oral QHS   metFORMIN   500 mg Oral BID WC    multivitamin with minerals  1 tablet Oral Daily   propranolol   10 mg Oral BID   valbenazine   80 mg Oral Daily      Subjective:   [name] [name] was seen and examined today.  Resting comfortably, keeping his eyes closed but responding to questions in whispers.  No acute nausea vomiting, chest pain, shortness of breath.    Objective:   Vitals:   04/03/24 1325 04/03/24 1956 04/03/24 2138 04/04/24 0628  BP: (!) 95/56 111/76 111/76 110/77  Pulse: 63 68 68 61  Resp: 18 18  16   Temp: (!) 97.5 F (36.4 C) (!) 97.4 F (36.3 C)  98.4 F (36.9 C)  TempSrc:      SpO2: 95% 93%  95%  Weight:      Height:        Intake/Output Summary (Last 24 hours) at 04/04/2024 1213 Last data filed at 04/04/2024 0500 Gross per 24 hour  Intake --  Output 600 ml  Net -600 ml     Wt Readings from Last 3 Encounters:  03/26/24 83 kg  02/25/24 93 kg  01/26/24 93 kg   Physical Exam General: Awake but keeping his eyes closed and whispering Cardiovascular: S1 S2 clear, RRR.  Respiratory: CTAB, no wheezing Gastrointestinal: Soft, nontender, nondistended, NBS Ext: no pedal edema bilaterally Neuro: no new deficits Psych: Flat affect   Data Reviewed:  I have personally reviewed following labs    CBC Lab Results  Component Value Date   WBC 6.4 03/31/2024   RBC 5.53 03/31/2024   HGB 15.8 03/31/2024   HCT 47.9 03/31/2024   MCV 86.6 03/31/2024   MCH 28.6 03/31/2024   PLT 190 03/31/2024   MCHC 33.0 03/31/2024   RDW 14.1 03/31/2024   LYMPHSABS 2.7 03/07/2024   MONOABS 0.8 03/07/2024   EOSABS 0.1 03/07/2024   BASOSABS 0.1 03/07/2024     Last metabolic panel Lab Results  Component Value Date   NA 137 03/31/2024   K 3.5 03/31/2024   CL 100 03/31/2024   CO2 27 03/31/2024   BUN 13 03/31/2024   CREATININE 0.96 03/31/2024   GLUCOSE 86 03/31/2024   GFRNONAA >60 03/31/2024   GFRAA >60 12/26/2019   CALCIUM  9.4 03/31/2024   PHOS 2.8 03/31/2024   PROT 7.2 03/26/2024   ALBUMIN  3.6  03/31/2024   LABGLOB 2.4 09/01/2022   AGRATIO 1.8 09/01/2022   BILITOT 0.8 03/26/2024   ALKPHOS 78 03/26/2024   AST 24 03/26/2024   ALT 29 03/26/2024   ANIONGAP 10 03/31/2024    CBG (last 3)  No results for input(s): GLUCAP in the last 72 hours.    Coagulation Profile: No results for input(s): INR, PROTIME in the last 168 hours.   Radiology Studies: I have personally reviewed the imaging studies  No results found.     Nydia Distance M.D. [facility] 04/04/2024, 12:13 PM  Available via Epic secure chat 7am-7pm After 7 pm, please refer to night coverage provider listed on amion.

## 2024-04-05 DIAGNOSIS — G9340 Encephalopathy, unspecified: Secondary | ICD-10-CM | POA: Diagnosis not present

## 2024-04-05 DIAGNOSIS — M545 Low back pain, unspecified: Secondary | ICD-10-CM | POA: Diagnosis not present

## 2024-04-05 DIAGNOSIS — E1165 Type 2 diabetes mellitus with hyperglycemia: Secondary | ICD-10-CM | POA: Diagnosis not present

## 2024-04-05 DIAGNOSIS — E782 Mixed hyperlipidemia: Secondary | ICD-10-CM | POA: Diagnosis not present

## 2024-04-05 DIAGNOSIS — G2401 Drug induced subacute dyskinesia: Secondary | ICD-10-CM | POA: Diagnosis not present

## 2024-04-05 NOTE — Progress Notes (Signed)
  Daily Progress Note   Patient Name: [name] [name]       Date: 04/05/2024 DOB: [birth year]  Age: 58 y.o. MRN#: [id number] Attending Physician: [name] Nydia POUR, MD Primary Care Physician: [name] Jeoffrey [name], [name] Admit Date: 03/02/2024 Length of Stay: 29 days  Reason for Consultation/Follow-up: Establishing goals of care  Subjective:   Resting in bed Deemed to lack capacity as per latest Psychiatry recommendation TOC working with DSS regarding legal guardian designation.   Objective:   Vital Signs:  BP 115/82 (BP Location: Right Arm)   Pulse 67   Temp (!) 97.5 F (36.4 C)   Resp 18   Ht 5' 10 (1.778 m)   Wt 83 kg   SpO2 94%   BMI 26.26 kg/m   Physical Exam: General: Asleep but appears comfortable Cardiovascular: RRR Respiratory: no increased work of breathing noted, not in respiratory distress Abdomen: not distended Neuro: Awake  Assessment & Plan:   Assessment: Patient is a 58 year old male with a past medical history of type I bipolar disorder, tardive dyskinesia, PTSD, MDD, GAD, tremor, frequent falls, and hyperlipidemia who was brought to the ED on 02/25/2023 by EMS for talking crazy.  Patient's roommate/friend reported patient was having frequent falls, erratic behavior, and difficulty caring for himself at home; roommate had also overheard patient stating that he may kill himself which was concerning since he has access to 2 guns in the house.  Initially patient was endorsing suicidal thoughts and IVC was obtained by psychiatry though psychiatry cleared for discharge on 02/29/2024.  Patient then noted to be encephalopathic and so was admitted to the hospital on 03/02/2024.  Hospitalization further complicated by hypotension requiring ICU admission though has currently resolved.  TOC involved to seek guardianship for patient as no family or friends willing to make decisions on patient's behalf.  Palliative medicine team consulted to assist with complex medical decision  making.   Recommendations/Plan: # Complex medical decision making/goals of care:   - Patient unable to participate in complex medical decision making due to underlying medical status.  Patient does not have friends or family willing to make decisions on his behalf.   Psychiatry reinvolved and patient deemed to lack capacity at present. Most recent psychiatry note reviewed.  Agree with seeking guardianship, TOC following.  My colleague [name] [name] has previously discussed with hospitalist about two-physician DNR based on patient's multiple medical comorbidities.  Continuing medically appropriate and reasonable medical interventions at this time and these are being done according to excepted practice and standards of care.  In the event of cardiac or respiratory arrest, an attempt at resuscitation would be unsuccessful in returning this patient to an already fragile state of health and cause unnecessary suffering in a patient with these multiple underlying medical comorbidities. Support placing a maintaining a DNR order and support the care plan as outlined by the primary service.   # Psycho-social/Spiritual Support:  - TOC seeking guardianship/working with DSS - TOC note reviewed.    # Discharge Planning:  To Be Determined  Thank you for allowing the palliative care team to participate in the care [name] [name].   Low MDM [name] Serve MD.  Palliative Care Provider PMT # [phone]  If patient remains symptomatic despite maximum doses, please call PMT at [phone] between 0700 and 1900. Outside of these hours, please call attending, as PMT does not have night coverage.

## 2024-04-05 NOTE — Plan of Care (Signed)

## 2024-04-05 NOTE — Progress Notes (Signed)
 [facility]                                                                              [name] [name], is a 58 y.o. male, DOB - 11-29-1965, FMW:[id number] Admit date - 03/02/2024    Outpatient Primary MD for the patient is [name], [name]  LOS - 29  days  Chief Complaint  Patient presents with   Placement       Brief summary   Patient is a 58 year old male with bipolar disorder type I, tardive dyskinesia, PTSD, MDD, GAD, DM-2, tremor, frequent falls and HLD brought to ED by EMS on 10/18 due to talking crazy.    Per EDP note, roommate/friend reported problems with the patient having frequent falls in the house, difficulty caring for himself, erratic behavior including today where he had a very loud outburst in front of her, calling her names, and then was overheard stating he may kill himself.  She is concerned because he has access to 2 guns in the house.    Patient was boarded in the emergency room since 10/18 10/19, psychiatry consulted as patient was endorsing suicidal thoughts-IVC 10/22, psychiatry cleared for discharge. 10/24, patient was noted to be encephalopathic so admitted to the hospital.  11/4, MRI under sedation, transient hypotension and altered mental status-overnight in ICU. 11/5, clinically improved.  Sedation wore off.  Transferred back to MedSurg bed.     Pending safe discharge planning.   Has not demonstrated capacity to make his own medical decision. Patient has no immediate family members.  APS involved.  Two-physician DNR on 03/27/2024 after discussion with palliative medicine given patient's multiple medical comorbidity.  Psychiatry reengaged to exhaust treatment option.   Awaiting placement  Assessment & Plan    Acute metabolic encephalopathy in a patient with underlying psychiatric disorder:  - Multifactorial including positive underlying cognitive impairment, dehydration, delirium and iatrogenic.   - CT head and MRI brain  without acute finding to explain patient's behavioral issue or encephalopathy. -- TSH, B12, EEG x 3 unrevealing. Low suspicion for infection.  - remains combative, aggressive and unpredictable at times.  He is imminent danger to self and staff. - Continue delirium precautions, fall precautions, restraints, psychiatry following. - No acute issues, resting comfortably, awaiting placement  Bipolar 1 disorder/MDD/GAD/PTSD:  - remains combative, aggressive and kicking and hitting staff at times even with the adjustment of his psych medications by psychiatry.  Currently requiring restraints.   -Continue Cogentin  1 mg bid, Atarax  50 mg qhs, Ingrezza  80mg  daily, Cymbalta  40 mg bid  -Psychiatry increase Depakote  from 750 mg twice daily to 625 mg 3 times daily.  Depakote  level was normal at 79. -Continue trazodone  and Haldol  as needed. -Palliative and psychiatry following.     Medical decision making capacity: -  Oriented to self and place for most part but not time.  Has no insight into his medical condition and treatment options.  Has been intermittently combative, aggressive and unpredictable at times.  Overall, he has not demonstrated a capacity to make his own medical decision during my evaluations.  - Per psychiatry evaluation, on 11/23, patient does not  have capacity at this time for medical decisions.    Hypotension 2/2 sedation: Resolved. Essential hypertension: Normotensive. - BP stable, continue amlodipine  5 mg daily, propranolol  10 mg twice daily  -Continue holding lisinopril  due to risk for AKI.   Diabetes mellitus type 2, NIDDM  A1c 6.3%.  CBG within acceptable range.  -Continue low-dose metformin .   Physical deconditioning/recurrent falls  -Therapy recommended SNF.   Hypoxemia:  - Resolved.  On room air.   Hyperlipidemia - Continue statin.   Vitamin D  deficiency: -Continue vitamin D  supplementation   Dehydration: dry and chapped lips - IV fluid bolus intermittently.    Goal of care:  - Poor long-term prognosis due to underlying complex mental health and very limited social support.  He has not demonstrated capacity to make his own medical decisions so far.  Has no immediate family member support.  Reportedly, only one estranged brother that does not want to talk to him. Lived with friend, [name] before this admission. Two person DNR after discussion with palliative medicine on 11/18. -Palliative medicine and psychiatry following. - APS involved for guardianship.   Mild to moderate protein calorie malnutrition Nutrition Problem: Increased nutrient needs Etiology: acute illness Signs/Symptoms: estimated needs  Interventions: Ensure Enlive (each supplement provides 350kcal and 20 grams of protein), MVI  Estimated body mass index is 26.26 kg/m as calculated from the following:   Height as of this encounter: 5' 10 (1.778 m).   Weight as of this encounter: 83 kg.  Code Status: DNR DVT Prophylaxis:  enoxaparin  (LOVENOX ) injection 40 mg Start: 03/08/24 0800   Level of Care: Level of care: Med-Surg Family Communication: Disposition Plan:      Remains inpatient appropriate: Awaiting placement.  No acute issues   Procedures:    Consultants:   Psychiatry  Antimicrobials:   Anti-infectives (From admission, onward)    Start     Dose/Rate Route Frequency Ordered Stop   03/12/24 1515  amoxicillin -clavulanate (AUGMENTIN ) 875-125 MG per tablet 1 tablet        1 tablet Oral Every 12 hours 03/12/24 1420 03/17/24 0959   03/12/24 1515  doxycycline  (VIBRA -TABS) tablet 100 mg  Status:  Discontinued        100 mg Oral Every 12 hours 03/12/24 1420 03/15/24 1100          Medications  amLODipine   5 mg Oral Daily   atorvastatin   20 mg Oral Daily   benztropine   1 mg Oral BID   divalproex   625 mg Oral Q8H   DULoxetine   40 mg Oral BID   enoxaparin  (LOVENOX ) injection  40 mg Subcutaneous Q24H   feeding supplement  237 mL Oral BID BM   hydrOXYzine   50 mg  Oral QHS   metFORMIN   500 mg Oral BID WC   multivitamin with minerals  1 tablet Oral Daily   propranolol   10 mg Oral BID   valbenazine   80 mg Oral Daily      Subjective:   [name] [name] was seen and examined today.  Resting comfortably, did not want to eat the breakfast, keeping his eyes closed but responded to RN's questions.  No visible distress, no chest pain, shortness of breath, nausea vomiting or abdominal pain  Objective:   Vitals:   04/05/24 0646 04/05/24 1107 04/05/24 1108 04/05/24 1207  BP: 115/82 106/66  101/67  Pulse: 67  69 62  Resp:    16  Temp:    (!) 96.8 F (36 C)  TempSrc:  SpO2: 94%   94%  Weight:      Height:        Intake/Output Summary (Last 24 hours) at 04/05/2024 1329 Last data filed at 04/05/2024 0407 Gross per 24 hour  Intake --  Output 400 ml  Net -400 ml     Wt Readings from Last 3 Encounters:  03/26/24 83 kg  02/25/24 93 kg  01/26/24 93 kg    Physical Exam General: Awake alert keeping his eyes closed Cardiovascular: S1 S2 clear, RRR.  Respiratory: CTAB, no wheezing Gastrointestinal: Soft, nontender, nondistended, NBS Ext: no pedal edema bilaterally Psych: Flat affect   Data Reviewed:  I have personally reviewed following labs    CBC Lab Results  Component Value Date   WBC 6.4 03/31/2024   RBC 5.53 03/31/2024   HGB 15.8 03/31/2024   HCT 47.9 03/31/2024   MCV 86.6 03/31/2024   MCH 28.6 03/31/2024   PLT 190 03/31/2024   MCHC 33.0 03/31/2024   RDW 14.1 03/31/2024   LYMPHSABS 2.7 03/07/2024   MONOABS 0.8 03/07/2024   EOSABS 0.1 03/07/2024   BASOSABS 0.1 03/07/2024     Last metabolic panel Lab Results  Component Value Date   NA 137 03/31/2024   K 3.5 03/31/2024   CL 100 03/31/2024   CO2 27 03/31/2024   BUN 13 03/31/2024   CREATININE 0.96 03/31/2024   GLUCOSE 86 03/31/2024   GFRNONAA >60 03/31/2024   GFRAA >60 12/26/2019   CALCIUM  9.4 03/31/2024   PHOS 2.8 03/31/2024   PROT 7.2 03/26/2024   ALBUMIN  3.6  03/31/2024   LABGLOB 2.4 09/01/2022   AGRATIO 1.8 09/01/2022   BILITOT 0.8 03/26/2024   ALKPHOS 78 03/26/2024   AST 24 03/26/2024   ALT 29 03/26/2024   ANIONGAP 10 03/31/2024    CBG (last 3)  No results for input(s): GLUCAP in the last 72 hours.    Coagulation Profile: No results for input(s): INR, PROTIME in the last 168 hours.   Radiology Studies: I have personally reviewed the imaging studies  No results found.     Nydia Distance M.D. [facility] 04/05/2024, 1:29 PM  Available via Epic secure chat 7am-7pm After 7 pm, please refer to night coverage provider listed on amion.

## 2024-04-06 DIAGNOSIS — E1165 Type 2 diabetes mellitus with hyperglycemia: Secondary | ICD-10-CM | POA: Diagnosis not present

## 2024-04-06 DIAGNOSIS — M545 Low back pain, unspecified: Secondary | ICD-10-CM | POA: Diagnosis not present

## 2024-04-06 DIAGNOSIS — G9340 Encephalopathy, unspecified: Secondary | ICD-10-CM | POA: Diagnosis not present

## 2024-04-06 DIAGNOSIS — G2401 Drug induced subacute dyskinesia: Secondary | ICD-10-CM | POA: Diagnosis not present

## 2024-04-06 NOTE — Progress Notes (Signed)
  Daily Progress Note   Patient Name: [name] [name]       Date: 04/06/2024 DOB: [birth year]  Age: 58 y.o. MRN#: [id number] Attending Physician: Davia Nydia POUR, MD Primary Care Physician: [name], [name] Admit Date: 03/02/2024 Length of Stay: 30 days  Reason for Consultation/Follow-up: Establishing goals of care  Subjective:   Resting in bed No acute distress.  TOC working with DSS regarding legal guardian engineer, materials.   Objective:   Vital Signs:  BP 96/75 (BP Location: Left Arm)   Pulse (!) 58   Temp (!) 97.5 F (36.4 C)   Resp 16   Ht 5' 10 (1.778 m)   Wt 83 kg   SpO2 96%   BMI 26.26 kg/m   Physical Exam: General: Awake comfortable Cardiovascular: RRR Respiratory: no increased work of breathing noted, not in respiratory distress Abdomen: not distended Neuro: Awake  Assessment & Plan:   Assessment: Patient is a 58 year old male with a past medical history of type I bipolar disorder, tardive dyskinesia, PTSD, MDD, GAD, tremor, frequent falls, and hyperlipidemia who was brought to the ED on 02/25/2023 by EMS for talking crazy.  Patient's roommate/friend reported patient was having frequent falls, erratic behavior, and difficulty caring for himself at home; roommate had also overheard patient stating that he may kill himself which was concerning since he has access to 2 guns in the house.  Initially patient was endorsing suicidal thoughts and IVC was obtained by psychiatry though psychiatry cleared for discharge on 02/29/2024.  Patient then noted to be encephalopathic and so was admitted to the hospital on 03/02/2024.  Hospitalization further complicated by hypotension requiring ICU admission though has currently resolved.  TOC involved to seek guardianship for patient as no family or friends willing to make decisions on patient's behalf.  Palliative medicine team consulted to assist with complex medical decision making.   Recommendations/Plan: # Complex medical  decision making/goals of care:   - Patient unable to participate in complex medical decision making due to underlying medical status.  Patient does not have friends or family willing to make decisions on his behalf.   Psychiatry reinvolved and patient deemed to lack capacity at present. Most recent psychiatry note reviewed.  Agree with seeking guardianship, TOC following.  My colleague Dr Clayton has previously discussed with hospitalist about two-physician DNR based on patient's multiple medical comorbidities.  Continuing medically appropriate and reasonable medical interventions at this time and these are being done according to excepted practice and standards of care.  In the event of cardiac or respiratory arrest, an attempt at resuscitation would be unsuccessful in returning this patient to an already fragile state of health and cause unnecessary suffering in a patient with these multiple underlying medical comorbidities. Support placing a maintaining a DNR order and support the care plan as outlined by the primary service.   # Psycho-social/Spiritual Support:  - TOC seeking guardianship/working with DSS - TOC note reviewed.    # Discharge Planning:  awaiting placement.   Thank you for allowing the palliative care team to participate in the care [name].   Low MDM [name].  Palliative Care Provider PMT # [phone]  If patient remains symptomatic despite maximum doses, please call PMT at [phone] between 0700 and 1900. Outside of these hours, please call attending, as PMT does not have night coverage.

## 2024-04-06 NOTE — Plan of Care (Signed)

## 2024-04-06 NOTE — Progress Notes (Signed)
 Physical Therapy Treatment Patient Details Name: [name] [name] MRN: [id number] DOB: Sep 22, 1965 Today's Date: 04/06/2024   History of Present Illness Pt is a 58 y/o male presenting on 10/24 for fall and chronic back pain. 10/19 psychiatry consulted due to suicidal thoughts, cleared on 10/22. Change in mental status due to acute delirium, and admitted from ED with acute metabolic encephalopathy. CT negative, EEG negative, MRI pending, chest x ray with hypoventilation. PMH includes: bipolar disorder type 1, tardive dyskinesia, PTSD, MDD, CAD, DM2, OA, tremors.    PT Comments  Pt ambulated 175' with RW, with 1 episode of loss of balance requiring min assist. Pt presents with flat affect and decreased awareness of deficits.     If plan is discharge home, recommend the following: Assistance with cooking/housework;Assist for transportation;Help with stairs or ramp for entrance;Direct supervision/assist for medications management;A little help with walking and/or transfers;A little help with bathing/dressing/bathroom;Direct supervision/assist for financial management   Can travel by private vehicle     Yes  Equipment Recommendations  None recommended by PT    Recommendations for Other Services       Precautions / Restrictions Precautions Precautions: Fall Recall of Precautions/Restrictions: Impaired Precaution/Restrictions Comments: decr awareness of balance deficits Restrictions Weight Bearing Restrictions Per Provider Order: No     Mobility  Bed Mobility   Bed Mobility: Supine to Sit     Supine to sit: Mod assist, HOB elevated, Used rails     General bed mobility comments: assist to raise trunk    Transfers Overall transfer level: Needs assistance Equipment used: Rolling walker (2 wheels) Transfers: Sit to/from Stand Sit to Stand: Min assist           General transfer comment: VCs hand placement, sit to stand x 2, min A to power up     Ambulation/Gait Ambulation/Gait assistance: Min assist Gait Distance (Feet): 175 Feet Assistive device: Rolling walker (2 wheels) Gait Pattern/deviations: Step-through pattern, Decreased stride length, Narrow base of support, Scissoring Gait velocity: decreased     General Gait Details: scissoring x 1 during a turn, required min A to steady, decreased awareness of loss of balance, no corrective response   Stairs             Wheelchair Mobility     Tilt Bed    Modified Rankin (Stroke Patients Only)       Balance Overall balance assessment: Needs assistance   Sitting balance-Leahy Scale: Good       Standing balance-Leahy Scale: Poor Standing balance comment: CGA to min assist, posterior bias                            Communication Communication Communication: No apparent difficulties Factors Affecting Communication: Other (comment) (minimally verbal)  Cognition Arousal: Alert Behavior During Therapy: Flat affect   PT - Cognitive impairments: No apparent impairments                         Following commands: Impaired Following commands impaired: Follows one step commands with increased time    Cueing Cueing Techniques: Verbal cues, Gestural cues  Exercises      General Comments        Pertinent Vitals/Pain Pain Assessment Pain Assessment: No/denies pain Faces Pain Scale: No hurt    Home Living  Prior Function            PT Goals (current goals can now be found in the care plan section) Acute Rehab PT Goals Patient Stated Goal: Decreased back pain; no more falls PT Goal Formulation: With patient Time For Goal Achievement: 04/16/24 Potential to Achieve Goals: Fair Progress towards PT goals: Progressing toward goals    Frequency    Min 2X/week      PT Plan      Co-evaluation              AM-PAC PT 6 Clicks Mobility   Outcome Measure  Help needed turning from  your back to your side while in a flat bed without using bedrails?: A Little Help needed moving from lying on your back to sitting on the side of a flat bed without using bedrails?: A Little Help needed moving to and from a bed to a chair (including a wheelchair)?: A Little Help needed standing up from a chair using your arms (e.g., wheelchair or bedside chair)?: A Little Help needed to walk in hospital room?: A Little Help needed climbing 3-5 steps with a railing? : A Little 6 Click Score: 18    End of Session Equipment Utilized During Treatment: Gait belt Activity Tolerance: Patient tolerated treatment well Patient left: in bed;with bed alarm set;with call bell/phone within reach Nurse Communication: Mobility status PT Visit Diagnosis: History of falling (Z91.81);Unsteadiness on feet (R26.81);Other abnormalities of gait and mobility (R26.89);Muscle weakness (generalized) (M62.81);Repeated falls (R29.6);Pain     Time: 8870-8846 PT Time Calculation (min) (ACUTE ONLY): 24 min  Charges:    $Gait Training: 8-22 mins $Therapeutic Activity: 8-22 mins PT General Charges $$ ACUTE PT VISIT: 1 Visit                     [name] PT 04/06/2024  Acute Rehabilitation Services  Office [phone]

## 2024-04-06 NOTE — Progress Notes (Signed)
 [facility]                                                                              Buzz Axel, is a 58 y.o. male, DOB - 01/29/1966, FMW:[id number] Admit date - 03/02/2024    Outpatient Primary MD for the patient is [name], [name]  LOS - 30  days  Chief Complaint  Patient presents with   Placement       Brief summary   Patient is a 58 year old male with bipolar disorder type I, tardive dyskinesia, PTSD, MDD, GAD, DM-2, tremor, frequent falls and HLD brought to ED by EMS on 10/18 due to talking crazy.    Per EDP note, roommate/friend reported problems with the patient having frequent falls in the house, difficulty caring for himself, erratic behavior including today where he had a very loud outburst in front of her, calling her names, and then was overheard stating he may kill himself.  She is concerned because he has access to 2 guns in the house.    Patient was boarded in the emergency room since 10/18 10/19, psychiatry consulted as patient was endorsing suicidal thoughts-IVC 10/22, psychiatry cleared for discharge. 10/24, patient was noted to be encephalopathic so admitted to the hospital.  11/4, MRI under sedation, transient hypotension and altered mental status-overnight in ICU. 11/5, clinically improved.  Sedation wore off.  Transferred back to MedSurg bed.     Pending safe discharge planning.   Has not demonstrated capacity to make his own medical decision. Patient has no immediate family members.  APS involved.  Two-physician DNR on 03/27/2024 after discussion with palliative medicine given patient's multiple medical comorbidity.  Psychiatry reengaged to exhaust treatment option.   Awaiting placement  Assessment & Plan    Acute metabolic encephalopathy in a patient with underlying psychiatric disorder:  - Multifactorial including positive underlying cognitive impairment, dehydration, delirium and iatrogenic.   - CT head and MRI brain  without acute finding to explain patient's behavioral issue or encephalopathy. -- TSH, B12, EEG x 3 unrevealing. Low suspicion for infection.  - remains combative, aggressive and unpredictable at times.  He is imminent danger to self and staff. - Continue delirium precautions, fall precautions, restraints, psychiatry following. - No acute issues  Bipolar 1 disorder/MDD/GAD/PTSD:  - remains combative, aggressive and kicking and hitting staff at times even with the adjustment of his psych medications by psychiatry.  Currently requiring restraints.   -Continue Cogentin  1 mg bid, Atarax  50 mg qhs, Ingrezza  80mg  daily, Cymbalta  40 mg bid  -Psychiatry increase Depakote  from 750 mg twice daily to 625 mg 3 times daily.  Depakote  level was normal at 79. -Continue trazodone  and Haldol  as needed. -Palliative and psychiatry following.     Medical decision making capacity: -  Oriented to self and place for most part but not time.  Has no insight into his medical condition and treatment options.  Has been intermittently combative, aggressive and unpredictable at times.  Overall, he has not demonstrated a capacity to make his own medical decision during my evaluations.  - Per psychiatry evaluation, on 11/23, patient does not have capacity at this  time for medical decisions.    Hypotension 2/2 sedation: Resolved. Essential hypertension: Normotensive. - BP stable, continue amlodipine  5 mg daily, propranolol  10 mg twice daily  -Continue holding lisinopril  due to risk for AKI.   Diabetes mellitus type 2, NIDDM  A1c 6.3%.  CBG within acceptable range.  -Continue low-dose metformin .   Physical deconditioning/recurrent falls  -Therapy recommended SNF.   Hypoxemia:  - Resolved.  On room air.   Hyperlipidemia - Continue statin.   Vitamin D  deficiency: -Continue vitamin D  supplementation   Dehydration: dry and chapped lips - IV fluid bolus intermittently.   Goal of care:  - Poor long-term prognosis  due to underlying complex mental health and very limited social support.  He has not demonstrated capacity to make his own medical decisions so far.  Has no immediate family member support.  Reportedly, only one estranged brother that does not want to talk to him. Lived with friend, [name] before this admission. Two person DNR after discussion with palliative medicine on 11/18. -Palliative medicine and psychiatry following. - APS involved for guardianship.   Mild to moderate protein calorie malnutrition Nutrition Problem: Increased nutrient needs Etiology: acute illness Signs/Symptoms: estimated needs  Interventions: Ensure Enlive (each supplement provides 350kcal and 20 grams of protein), MVI  Estimated body mass index is 26.26 kg/m as calculated from the following:   Height as of this encounter: 5' 10 (1.778 m).   Weight as of this encounter: 83 kg.  Code Status: DNR DVT Prophylaxis:  enoxaparin  (LOVENOX ) injection 40 mg Start: 03/08/24 0800   Level of Care: Level of care: Med-Surg Family Communication: Disposition Plan:      Remains inpatient appropriate: Awaiting placement.  No acute issues   Procedures:    Consultants:   Psychiatry  Antimicrobials:   Anti-infectives (From admission, onward)    Start     Dose/Rate Route Frequency Ordered Stop   03/12/24 1515  amoxicillin -clavulanate (AUGMENTIN ) 875-125 MG per tablet 1 tablet        1 tablet Oral Every 12 hours 03/12/24 1420 03/17/24 0959   03/12/24 1515  doxycycline  (VIBRA -TABS) tablet 100 mg  Status:  Discontinued        100 mg Oral Every 12 hours 03/12/24 1420 03/15/24 1100          Medications  amLODipine   5 mg Oral Daily   atorvastatin   20 mg Oral Daily   benztropine   1 mg Oral BID   divalproex   625 mg Oral Q8H   DULoxetine   40 mg Oral BID   enoxaparin  (LOVENOX ) injection  40 mg Subcutaneous Q24H   feeding supplement  237 mL Oral BID BM   hydrOXYzine   50 mg Oral QHS   metFORMIN   500 mg Oral BID WC    multivitamin with minerals  1 tablet Oral Daily   propranolol   10 mg Oral BID   valbenazine   80 mg Oral Daily      Subjective:   [name] [name] was seen and examined today.  No acute issues, resting comfortably.    Objective:   Vitals:   04/05/24 1108 04/05/24 1207 04/05/24 1952 04/06/24 0530  BP:  101/67 102/68 96/75  Pulse: 69 62 69 (!) 58  Resp:  16 18 16   Temp:  (!) 96.8 F (36 C) 98.3 F (36.8 C) (!) 97.5 F (36.4 C)  TempSrc:      SpO2:  94% 95% 96%  Weight:      Height:        Intake/Output  Summary (Last 24 hours) at 04/06/2024 1235 Last data filed at 04/06/2024 1100 Gross per 24 hour  Intake 240 ml  Output 400 ml  Net -160 ml     Wt Readings from Last 3 Encounters:  03/26/24 83 kg  02/25/24 93 kg  01/26/24 93 kg   Physical Exam General: Alert and oriented x 3, NAD Cardiovascular: S1 S2 clear, RRR.  Respiratory: CTAB, no wheezing Gastrointestinal: Soft, nontender, nondistended, NBS Ext: no pedal edema bilaterally Neuro: no new deficits Psych: Flat affect   Data Reviewed:  I have personally reviewed following labs    CBC Lab Results  Component Value Date   WBC 6.4 03/31/2024   RBC 5.53 03/31/2024   HGB 15.8 03/31/2024   HCT 47.9 03/31/2024   MCV 86.6 03/31/2024   MCH 28.6 03/31/2024   PLT 190 03/31/2024   MCHC 33.0 03/31/2024   RDW 14.1 03/31/2024   LYMPHSABS 2.7 03/07/2024   MONOABS 0.8 03/07/2024   EOSABS 0.1 03/07/2024   BASOSABS 0.1 03/07/2024     Last metabolic panel Lab Results  Component Value Date   NA 137 03/31/2024   K 3.5 03/31/2024   CL 100 03/31/2024   CO2 27 03/31/2024   BUN 13 03/31/2024   CREATININE 0.96 03/31/2024   GLUCOSE 86 03/31/2024   GFRNONAA >60 03/31/2024   GFRAA >60 12/26/2019   CALCIUM  9.4 03/31/2024   PHOS 2.8 03/31/2024   PROT 7.2 03/26/2024   ALBUMIN  3.6 03/31/2024   LABGLOB 2.4 09/01/2022   AGRATIO 1.8 09/01/2022   BILITOT 0.8 03/26/2024   ALKPHOS 78 03/26/2024   AST 24 03/26/2024   ALT  29 03/26/2024   ANIONGAP 10 03/31/2024    CBG (last 3)  No results for input(s): GLUCAP in the last 72 hours.    Coagulation Profile: No results for input(s): INR, PROTIME in the last 168 hours.   Radiology Studies: I have personally reviewed the imaging studies  No results found.     Nydia Distance M.D. [facility] 04/06/2024, 12:35 PM  Available via Epic secure chat 7am-7pm After 7 pm, please refer to night coverage provider listed on amion.

## 2024-04-07 DIAGNOSIS — E1165 Type 2 diabetes mellitus with hyperglycemia: Secondary | ICD-10-CM | POA: Diagnosis not present

## 2024-04-07 DIAGNOSIS — G9340 Encephalopathy, unspecified: Secondary | ICD-10-CM | POA: Diagnosis not present

## 2024-04-07 DIAGNOSIS — G2401 Drug induced subacute dyskinesia: Secondary | ICD-10-CM | POA: Diagnosis not present

## 2024-04-07 DIAGNOSIS — M545 Low back pain, unspecified: Secondary | ICD-10-CM | POA: Diagnosis not present

## 2024-04-07 NOTE — Plan of Care (Signed)
  Problem: Education: Goal: Ability to describe self-care measures that may prevent or decrease complications (Diabetes Survival Skills Education) will improve Outcome: Progressing   Problem: Pain Managment: Goal: General experience of comfort will improve and/or be controlled Outcome: Progressing   Problem: Skin Integrity: Goal: Risk for impaired skin integrity will decrease Outcome: Progressing

## 2024-04-07 NOTE — Progress Notes (Signed)
 [facility]                                                                              [name] [name], is a 58 y.o. male, DOB - Dec 20, 1965, FMW:[id number] Admit date - 03/02/2024    Outpatient Primary MD for the patient is [name], [name]  LOS - 31  days  Chief Complaint  Patient presents with   Placement       Brief summary   Patient is a 58 year old male with bipolar disorder type I, tardive dyskinesia, PTSD, MDD, GAD, DM-2, tremor, frequent falls and HLD brought to ED by EMS on 10/18 due to talking crazy.    Per EDP note, roommate/friend reported problems with the patient having frequent falls in the house, difficulty caring for himself, erratic behavior including today where he had a very loud outburst in front of her, calling her names, and then was overheard stating he may kill himself.  She is concerned because he has access to 2 guns in the house.    Patient was boarded in the emergency room since 10/18 10/19, psychiatry consulted as patient was endorsing suicidal thoughts-IVC 10/22, psychiatry cleared for discharge. 10/24, patient was noted to be encephalopathic so admitted to the hospital.  11/4, MRI under sedation, transient hypotension and altered mental status-overnight in ICU. 11/5, clinically improved.  Sedation wore off.  Transferred back to MedSurg bed.     Pending safe discharge planning.   Has not demonstrated capacity to make his own medical decision. Patient has no immediate family members.  APS involved.  Two-physician DNR on 03/27/2024 after discussion with palliative medicine given patient's multiple medical comorbidity.  Psychiatry reengaged to exhaust treatment option.   Awaiting placement  Assessment & Plan    Acute metabolic encephalopathy in a patient with underlying psychiatric disorder:  - Multifactorial including positive underlying cognitive impairment, dehydration, delirium and iatrogenic.   - CT head and MRI brain  without acute finding to explain patient's behavioral issue or encephalopathy. -- TSH, B12, EEG x 3 unrevealing. Low suspicion for infection.  - remains combative, aggressive and unpredictable at times.  He is imminent danger to self and staff. - Continue delirium precautions, fall precautions, restraints, psychiatry following. - Alert and oriented, no acute issues, awaiting placement  Bipolar 1 disorder/MDD/GAD/PTSD:  - remains combative, aggressive and kicking and hitting staff at times even with the adjustment of his psych medications by psychiatry.  Currently requiring restraints.   -Continue Cogentin  1 mg bid, Atarax  50 mg qhs, Ingrezza  80mg  daily, Cymbalta  40 mg bid  -Psychiatry increase Depakote  from 750 mg twice daily to 625 mg 3 times daily.  Depakote  level was normal at 79. -Continue trazodone  and Haldol  as needed. -Palliative and psychiatry following.     Medical decision making capacity: -  Oriented to self and place for most part but not time.  Has no insight into his medical condition and treatment options.  Has been intermittently combative, aggressive and unpredictable at times.  Overall, he has not demonstrated a capacity to make his own medical decision during my evaluations.  - Per psychiatry evaluation, on 11/23, patient does  not have capacity at this time for medical decisions.    Hypotension 2/2 sedation: Resolved. Essential hypertension: Normotensive. - BP stable, continue amlodipine  5 mg daily, propranolol  10 mg twice daily  -Continue holding lisinopril  due to risk for AKI.   Diabetes mellitus type 2, NIDDM  A1c 6.3%.  CBG within acceptable range.  -Continue low-dose metformin .   Physical deconditioning/recurrent falls  -Therapy recommended SNF.   Hypoxemia:  - Resolved.  On room air.   Hyperlipidemia - Continue statin.   Vitamin D  deficiency: -Continue vitamin D  supplementation   Dehydration: dry and chapped lips - IV fluid bolus intermittently.    Goal of care:  - Poor long-term prognosis due to underlying complex mental health and very limited social support.  He has not demonstrated capacity to make his own medical decisions so far.  Has no immediate family member support.  Reportedly, only one estranged brother that does not want to talk to him. Lived with friend, [name] before this admission. Two person DNR after discussion with palliative medicine on 11/18. -Palliative medicine and psychiatry following. - APS involved for guardianship.   Mild to moderate protein calorie malnutrition Nutrition Problem: Increased nutrient needs Etiology: acute illness Signs/Symptoms: estimated needs  Interventions: Ensure Enlive (each supplement provides 350kcal and 20 grams of protein), MVI  Estimated body mass index is 26.26 kg/m as calculated from the following:   Height as of this encounter: 5' 10 (1.778 m).   Weight as of this encounter: 83 kg.  Code Status: DNR DVT Prophylaxis:  enoxaparin  (LOVENOX ) injection 40 mg Start: 03/08/24 0800   Level of Care: Level of care: Med-Surg Family Communication: Disposition Plan:      Remains inpatient appropriate: Awaiting placement.  No acute issues   Procedures:    Consultants:   Psychiatry  Antimicrobials:   Anti-infectives (From admission, onward)    Start     Dose/Rate Route Frequency Ordered Stop   03/12/24 1515  amoxicillin -clavulanate (AUGMENTIN ) 875-125 MG per tablet 1 tablet        1 tablet Oral Every 12 hours 03/12/24 1420 03/17/24 0959   03/12/24 1515  doxycycline  (VIBRA -TABS) tablet 100 mg  Status:  Discontinued        100 mg Oral Every 12 hours 03/12/24 1420 03/15/24 1100          Medications  amLODipine   5 mg Oral Daily   atorvastatin   20 mg Oral Daily   benztropine   1 mg Oral BID   divalproex   625 mg Oral Q8H   DULoxetine   40 mg Oral BID   enoxaparin  (LOVENOX ) injection  40 mg Subcutaneous Q24H   feeding supplement  237 mL Oral BID BM   hydrOXYzine   50 mg  Oral QHS   metFORMIN   500 mg Oral BID WC   multivitamin with minerals  1 tablet Oral Daily   propranolol   10 mg Oral BID   valbenazine   80 mg Oral Daily      Subjective:   [name] [name] was seen and examined today.  Alert and oriented today, responding to all questions appropriately.  No acute issues, resting comfortably.   Objective:   Vitals:   04/06/24 1252 04/06/24 1928 04/06/24 2134 04/07/24 0414  BP: 107/76 102/80 (!) 118/90 128/84  Pulse: 70 72 68 (!) 57  Resp: 16 18  18   Temp: 98.1 F (36.7 C) 97.7 F (36.5 C)  97.7 F (36.5 C)  TempSrc:  Oral    SpO2: 96% 95%  96%  Weight:  Height:        Intake/Output Summary (Last 24 hours) at 04/07/2024 1305 Last data filed at 04/07/2024 0900 Gross per 24 hour  Intake 240 ml  Output 1164 ml  Net -924 ml     Wt Readings from Last 3 Encounters:  03/26/24 83 kg  02/25/24 93 kg  01/26/24 93 kg    Physical Exam General: Alert and oriented x self and place, NAD  Cardiovascular: S1 S2 clear, RRR.  Respiratory: CTAB Gastrointestinal: Soft, nontender, nondistended, NBS Ext: no pedal edema bilaterally Neuro: no new deficits Psych: Normal affect    Data Reviewed:  I have personally reviewed following labs    CBC Lab Results  Component Value Date   WBC 6.4 03/31/2024   RBC 5.53 03/31/2024   HGB 15.8 03/31/2024   HCT 47.9 03/31/2024   MCV 86.6 03/31/2024   MCH 28.6 03/31/2024   PLT 190 03/31/2024   MCHC 33.0 03/31/2024   RDW 14.1 03/31/2024   LYMPHSABS 2.7 03/07/2024   MONOABS 0.8 03/07/2024   EOSABS 0.1 03/07/2024   BASOSABS 0.1 03/07/2024     Last metabolic panel Lab Results  Component Value Date   NA 137 03/31/2024   K 3.5 03/31/2024   CL 100 03/31/2024   CO2 27 03/31/2024   BUN 13 03/31/2024   CREATININE 0.96 03/31/2024   GLUCOSE 86 03/31/2024   GFRNONAA >60 03/31/2024   GFRAA >60 12/26/2019   CALCIUM  9.4 03/31/2024   PHOS 2.8 03/31/2024   PROT 7.2 03/26/2024   ALBUMIN  3.6 03/31/2024    LABGLOB 2.4 09/01/2022   AGRATIO 1.8 09/01/2022   BILITOT 0.8 03/26/2024   ALKPHOS 78 03/26/2024   AST 24 03/26/2024   ALT 29 03/26/2024   ANIONGAP 10 03/31/2024    CBG (last 3)  No results for input(s): GLUCAP in the last 72 hours.    Coagulation Profile: No results for input(s): INR, PROTIME in the last 168 hours.   Radiology Studies: I have personally reviewed the imaging studies  No results found.     Nydia Distance M.D. [facility] 04/07/2024, 1:05 PM  Available via Epic secure chat 7am-7pm After 7 pm, please refer to night coverage provider listed on amion.

## 2024-04-07 NOTE — Plan of Care (Signed)

## 2024-04-08 DIAGNOSIS — Z7189 Other specified counseling: Secondary | ICD-10-CM | POA: Diagnosis not present

## 2024-04-08 DIAGNOSIS — R531 Weakness: Secondary | ICD-10-CM | POA: Diagnosis not present

## 2024-04-08 DIAGNOSIS — G9340 Encephalopathy, unspecified: Secondary | ICD-10-CM | POA: Diagnosis not present

## 2024-04-08 DIAGNOSIS — M545 Low back pain, unspecified: Secondary | ICD-10-CM | POA: Diagnosis not present

## 2024-04-08 NOTE — Progress Notes (Signed)
 PROGRESS NOTE  [name] [name] FMW:[id number] DOB: 31-Dec-1965   PCP: [name], [name]  Patient is from: Home.  Lives with friend, [name].  No immediate family members.  Only one estranged brother that does not want to talk to him.  DOA: 03/02/2024 LOS: 32  Chief complaints Chief Complaint  Patient presents with   Placement     Brief Narrative / Interim history: 58 year old M with PMH of type I bipolar disorder, tardive dyskinesia, PTSD, MDD, GAD, DM-2, tremor, frequent falls and HLD brought to ED by EMS on 10/18 due to talking crazy.   Per EDP note, roommate/friend reported problems with the patient having frequent falls in the house, difficulty caring for himself, erratic behavior including today where he had a very loud outburst in front of her, calling her names, and then was overheard stating he may kill himself.  She is concerned because he has access to 2 guns in the house.     Patient was boarded in the emergency room since 10/18 10/19, psychiatry consulted as patient was endorsing suicidal thoughts-IVC 10/22, psychiatry cleared for discharge. 10/24, patient was noted to be encephalopathic so admitted to the hospital.  11/4, MRI under sedation, transient hypotension and altered mental status-overnight in ICU. 11/5, clinically improved.  Sedation wore off.  Transferred back to MedSurg bed.    Has no capacity. Two-physician DNR on 03/27/2024 after discussion with palliative medicine given patient's multiple medical comorbidity. APS involved.  Waiting on placement.   Subjective: Seen and examined earlier this morning.  No major events overnight or this morning.  Patient wakes to voice and goes back to sleep.  Intermittently responds to question.  Not engaging.   Assessment and plan: Acute metabolic encephalopathy in a patient with underlying psychiatric disorder: Multifactorial including positive underlying cognitive impairment, dehydration, delirium and iatrogenic.   CT head and MRI brain without acute finding to explain patient's behavioral issue or encephalopathy.  TSH, B12, EEG x 3 unrevealing.  CBC on 10/24 with signs of hemoconcentration.  Improved. -Reorientation and delirium precaution   Bipolar 1 disorder/MDD/GAD/PTSD: Patient remains combative, aggressive and kicking and hitting staff at times even with the adjustment of his psych medications by psychiatry.  Currently requiring restraints.   -Continue Cogentin  1 mg bid, Atarax  50 mg qhs, Ingrezza  80mg  daily, Cymbalta  40 mg bid  -Psychiatry increase Depakote  from 750 mg twice daily to 625 mg 3 times daily.  Depakote  level was normal at 79. -Continue trazodone  and Haldol  as needed.   Medical decision making capacity: Oriented to self and place for most part but not time.  Has no insight into his medical condition and treatment options.  Evaluated by psychiatry on 11/23.  He did not demonstrate capacity to make his own medical decision.  APS involved.  Hypotension 2/2 sedation: Resolved. Essential hypertension: Normotensive. -Continue low-dose amlodipine . -Continue home Inderal -decreased to twice daily given borderline heart rate. -Continue holding lisinopril  due to risk for AKI.  NIDDM-2: A1c 6.3%.  CBG within acceptable range.  CBG monitoring discontinued. -Continue low-dose metformin .   Physical deconditioning/recurrent falls  -Therapy recommended SNF.   Hypoxemia: Resolved.  On room air.  Hyperlipidemia - Continue statin.   Vitamin D  deficiency: -Continue vitamin D  supplementation  Dehydration: dry and chapped lips - IV fluid bolus intermittently.  Goal of care: Poor long-term prognosis due to underlying complex mental health and very limited social support.  He has not demonstrated capacity to make his own medical decisions so far.  Has no immediate  family member.  Reportedly, only one estranged brother that does not want to talk to him. Lived with friend, [name] before this  admission. Two person DNR after discussion with palliative medicine on 11/18. - APS involved for guardianship.  Increased nutrient needs Body mass index is 26.26 kg/m. Nutrition Problem: Increased nutrient needs Etiology: acute illness Signs/Symptoms: estimated needs Interventions: Ensure Enlive (each supplement provides 350kcal and 20 grams of protein), MVI   DVT prophylaxis:  enoxaparin  (LOVENOX ) injection 40 mg Start: 03/08/24 0800  Code Status: DNR. Family Communication: None at bedside Level of care: Med-Surg Status is: Inpatient Remains inpatient appropriate because: SNF bed   Final disposition: SNF   35 minutes with more than 50% spent in reviewing records, counseling patient/family and coordinating care.  Consultants:  Psychiatry Palliative medicine  Procedures: None  Microbiology summarized: MRSA PCR screen positive.  Objective: Vitals:   04/07/24 2203 04/07/24 2204 04/08/24 0538 04/08/24 0920  BP: 121/89 121/89 104/84   Pulse: 79 79 (!) 55 (!) 55  Resp:   14   Temp:   (!) 97.4 F (36.3 C)   TempSrc:   Oral   SpO2: 98%  98%   Weight:      Height:        Examination:  GENERAL: No apparent distress.  Nontoxic. HEENT: Chapped and dry lips.  Vision and hearing grossly intact.  NECK: Supple.  No apparent JVD.  RESP:  No IWOB.  On room air. CVS:  RRR. Heart sounds normal.  ABD/GI/GU: BS+. Abd soft, NTND.  MSK/EXT:  Moves extremities.  SKIN: no apparent skin lesion or wound NEURO: Sleepy.  No apparent focal neuro deficit. PSYCH: Currently sleepy.  Sch Meds:  Scheduled Meds:  amLODipine   5 mg Oral Daily   atorvastatin   20 mg Oral Daily   benztropine   1 mg Oral BID   divalproex   625 mg Oral Q8H   DULoxetine   40 mg Oral BID   enoxaparin  (LOVENOX ) injection  40 mg Subcutaneous Q24H   feeding supplement  237 mL Oral BID BM   hydrOXYzine   50 mg Oral QHS   metFORMIN   500 mg Oral BID WC   multivitamin with minerals  1 tablet Oral Daily    propranolol   10 mg Oral BID   valbenazine   80 mg Oral Daily   Continuous Infusions:   PRN Meds:.acetaminophen  **OR** acetaminophen , artificial tears, benzocaine , bisacodyl , haloperidol  **OR** haloperidol  lactate, lip balm, ondansetron  **OR** ondansetron  (ZOFRAN ) IV, mouth rinse, senna-docusate, traZODone   Antimicrobials: Anti-infectives (From admission, onward)    Start     Dose/Rate Route Frequency Ordered Stop   03/12/24 1515  amoxicillin -clavulanate (AUGMENTIN ) 875-125 MG per tablet 1 tablet        1 tablet Oral Every 12 hours 03/12/24 1420 03/17/24 0959   03/12/24 1515  doxycycline  (VIBRA -TABS) tablet 100 mg  Status:  Discontinued        100 mg Oral Every 12 hours 03/12/24 1420 03/15/24 1100        I have personally reviewed the following labs and images: CBC: No results for input(s): WBC, NEUTROABS, HGB, HCT, MCV, PLT in the last 168 hours.  BMP &GFR No results for input(s): NA, K, CL, CO2, GLUCOSE, BUN, CREATININE, CALCIUM , MG, PHOS in the last 168 hours.  Invalid input(s): GFRCG  Estimated Creatinine Clearance: 85.5 mL/min (by C-G formula based on SCr of 0.96 mg/dL). Liver & Pancreas: No results for input(s): AST, ALT, ALKPHOS, BILITOT, PROT, ALBUMIN  in the last 168 hours.  No results for  input(s): LIPASE, AMYLASE in the last 168 hours. No results for input(s): AMMONIA in the last 168 hours. Diabetic: No results for input(s): HGBA1C in the last 72 hours. No results for input(s): GLUCAP in the last 168 hours.  Cardiac Enzymes: No results for input(s): CKTOTAL, CKMB, CKMBINDEX, TROPONINI in the last 168 hours.  No results for input(s): PROBNP in the last 8760 hours. Coagulation Profile: No results for input(s): INR, PROTIME in the last 168 hours. Thyroid  Function Tests: No results for input(s): TSH, T4TOTAL, FREET4, T3FREE, THYROIDAB in the last 72 hours. Lipid Profile: No results for  input(s): CHOL, HDL, LDLCALC, TRIG, CHOLHDL, LDLDIRECT in the last 72 hours. Anemia Panel: No results for input(s): VITAMINB12, FOLATE, FERRITIN, TIBC, IRON, RETICCTPCT in the last 72 hours. Urine analysis:    Component Value Date/Time   COLORURINE YELLOW 03/07/2024 2022   APPEARANCEUR CLEAR 03/07/2024 2022   LABSPEC 1.024 03/07/2024 2022   PHURINE 6.0 03/07/2024 2022   GLUCOSEU NEGATIVE 03/07/2024 2022   HGBUR NEGATIVE 03/07/2024 2022   BILIRUBINUR NEGATIVE 03/07/2024 2022   KETONESUR 5 (A) 03/07/2024 2022   PROTEINUR NEGATIVE 03/07/2024 2022   UROBILINOGEN 1.0 10/24/2008 1155   NITRITE NEGATIVE 03/07/2024 2022   LEUKOCYTESUR TRACE (A) 03/07/2024 2022   Sepsis Labs: Invalid input(s): PROCALCITONIN, LACTICIDVEN  Microbiology: No results found for this or any previous visit (from the past 240 hours).   Radiology Studies: No results found.    Terrisa Curfman T. Sylvie Mifsud [facility]  If 7PM-7AM, please contact night-coverage [email or web address] 04/08/2024, 2:15 PM

## 2024-04-08 NOTE — Plan of Care (Signed)

## 2024-04-09 DIAGNOSIS — G2401 Drug induced subacute dyskinesia: Secondary | ICD-10-CM | POA: Diagnosis not present

## 2024-04-09 DIAGNOSIS — Z7189 Other specified counseling: Secondary | ICD-10-CM | POA: Diagnosis not present

## 2024-04-09 DIAGNOSIS — R531 Weakness: Secondary | ICD-10-CM | POA: Diagnosis not present

## 2024-04-09 DIAGNOSIS — M545 Low back pain, unspecified: Secondary | ICD-10-CM | POA: Diagnosis not present

## 2024-04-09 DIAGNOSIS — G9340 Encephalopathy, unspecified: Secondary | ICD-10-CM | POA: Diagnosis not present

## 2024-04-09 DIAGNOSIS — E1165 Type 2 diabetes mellitus with hyperglycemia: Secondary | ICD-10-CM | POA: Diagnosis not present

## 2024-04-09 NOTE — TOC Progression Note (Addendum)
 Transition of Care [facility]) - Progression Note    Patient Details  Name: [name] [name] MRN: [id number] Date of Birth: [birth year]  Transition of [facility]) CM/SW Contact  Doneta Glenys DASEN, RN Phone Number: 04/09/2024, 8:21 AM  Clinical Narrative:    CM called APS Terris Gosling [phone] left message. Dr. Kathrin sent FYI  that insurance has initiated partial insurance denial from 11/26 going forward.He will appeal the denial and do a peer to peer. Discharge disposition assistance has been escalated to Delon Lesches and Arthea Sprang. 12:31 PM CM called [employer] to request case manager, since patient is not able to speak with agent(Ivy) they are not able to assist. 2:01 PM CM called APS Donald Gaskins [phone]  supervisor of APS Terris Gosling for update. 2:34 PM APS Terris Gosling came to see patient. Patient was asleep and difficult to arouse. Shefi stated that she did not receive the progress note for mental capacity on 11/25. CM showed 11/25 email to [facility] to confirm email address. Shefi confirmed correct email and sent again and was received.CM will wait for decision.    Barriers to Discharge: SNF Pending bed offer               Expected Discharge Plan and Services                                               Social Drivers of Health (SDOH) Interventions SDOH Screenings   Food Insecurity: Patient Unable To Answer (03/07/2024)  Housing: Unknown (03/07/2024)  Transportation Needs: Patient Unable To Answer (03/07/2024)  Utilities: Patient Unable To Answer (03/07/2024)  Depression (PHQ2-9): High Risk (01/26/2024)  Social Connections: Unknown (09/22/2021)   Received from [facility]  Tobacco Use: Low Risk  (03/13/2024)    Readmission Risk Interventions     No data to display

## 2024-04-09 NOTE — Plan of Care (Signed)

## 2024-04-09 NOTE — Plan of Care (Signed)
 Problem: Health Behavior/Discharge Planning: Goal: Ability to manage health-related needs will improve 04/09/2024 0541 by Eliane Vola HERO, RN Outcome: Progressing 04/09/2024 0541 by Eliane Vola HERO, RN Outcome: Progressing   Problem: Clinical Measurements: Goal: Ability to maintain clinical measurements within normal limits will improve 04/09/2024 0541 by Eliane Vola HERO, RN Outcome: Progressing 04/09/2024 0541 by Eliane Vola HERO, RN Outcome: Progressing Goal: Will remain free from infection 04/09/2024 0541 by Eliane Vola HERO, RN Outcome: Progressing 04/09/2024 0541 by Eliane Vola HERO, RN Outcome: Progressing Goal: Diagnostic test results will improve 04/09/2024 0541 by Eliane Vola HERO, RN Outcome: Progressing 04/09/2024 0541 by Eliane Vola HERO, RN Outcome: Progressing Goal: Respiratory complications will improve 04/09/2024 0541 by Eliane Vola HERO, RN Outcome: Progressing 04/09/2024 0541 by Eliane Vola HERO, RN Outcome: Progressing Goal: Cardiovascular complication will be avoided 04/09/2024 0541 by Eliane Vola HERO, RN Outcome: Progressing 04/09/2024 0541 by Eliane Vola HERO, RN Outcome: Progressing   Problem: Activity: Goal: Risk for activity intolerance will decrease 04/09/2024 0541 by Eliane Vola HERO, RN Outcome: Progressing 04/09/2024 0541 by Eliane Vola HERO, RN Outcome: Progressing   Problem: Nutrition: Goal: Adequate nutrition will be maintained 04/09/2024 0541 by Eliane Vola HERO, RN Outcome: Progressing 04/09/2024 0541 by Eliane Vola HERO, RN Outcome: Progressing   Problem: Coping: Goal: Level of anxiety will decrease 04/09/2024 0541 by Eliane Vola HERO, RN Outcome: Progressing 04/09/2024 0541 by Eliane Vola HERO, RN Outcome: Progressing   Problem: Elimination: Goal: Will not experience complications related to bowel motility 04/09/2024 0541 by Eliane Vola HERO, RN Outcome: Progressing 04/09/2024 0541 by Eliane Vola HERO, RN Outcome:  Progressing Goal: Will not experience complications related to urinary retention 04/09/2024 0541 by Eliane Vola HERO, RN Outcome: Progressing 04/09/2024 0541 by Eliane Vola HERO, RN Outcome: Progressing   Problem: Pain Managment: Goal: General experience of comfort will improve and/or be controlled 04/09/2024 0541 by Eliane Vola HERO, RN Outcome: Progressing 04/09/2024 0541 by Eliane Vola HERO, RN Outcome: Progressing   Problem: Safety: Goal: Ability to remain free from injury will improve 04/09/2024 0541 by Eliane Vola HERO, RN Outcome: Progressing 04/09/2024 0541 by Eliane Vola HERO, RN Outcome: Progressing   Problem: Skin Integrity: Goal: Risk for impaired skin integrity will decrease 04/09/2024 0541 by Eliane Vola HERO, RN Outcome: Progressing 04/09/2024 0541 by Eliane Vola HERO, RN Outcome: Progressing   Problem: Education: Goal: Ability to describe self-care measures that may prevent or decrease complications (Diabetes Survival Skills Education) will improve 04/09/2024 0541 by Eliane Vola HERO, RN Outcome: Progressing 04/09/2024 0541 by Eliane Vola HERO, RN Outcome: Progressing Goal: Individualized Educational Video(s) 04/09/2024 0541 by Eliane Vola HERO, RN Outcome: Progressing 04/09/2024 0541 by Eliane Vola HERO, RN Outcome: Progressing   Problem: Coping: Goal: Ability to adjust to condition or change in health will improve 04/09/2024 0541 by Eliane Vola HERO, RN Outcome: Progressing 04/09/2024 0541 by Eliane Vola HERO, RN Outcome: Progressing   Problem: Fluid Volume: Goal: Ability to maintain a balanced intake and output will improve 04/09/2024 0541 by Eliane Vola HERO, RN Outcome: Progressing 04/09/2024 0541 by Eliane Vola HERO, RN Outcome: Progressing   Problem: Health Behavior/Discharge Planning: Goal: Ability to identify and utilize available resources and services will improve 04/09/2024 0541 by Eliane Vola HERO, RN Outcome:  Progressing 04/09/2024 0541 by Eliane Vola HERO, RN Outcome: Progressing Goal: Ability to manage health-related needs will improve 04/09/2024 0541 by Eliane Vola HERO, RN Outcome: Progressing 04/09/2024 0541 by Eliane Vola HERO, RN Outcome: Progressing   Problem: Metabolic: Goal: Ability to maintain appropriate  glucose levels will improve 04/09/2024 0541 by Eliane Vola HERO, RN Outcome: Progressing 04/09/2024 0541 by Eliane Vola HERO, RN Outcome: Progressing   Problem: Nutritional: Goal: Maintenance of adequate nutrition will improve 04/09/2024 0541 by Eliane Vola HERO, RN Outcome: Progressing 04/09/2024 0541 by Eliane Vola HERO, RN Outcome: Progressing Goal: Progress toward achieving an optimal weight will improve 04/09/2024 0541 by Eliane Vola HERO, RN Outcome: Progressing 04/09/2024 0541 by Eliane Vola HERO, RN Outcome: Progressing   Problem: Skin Integrity: Goal: Risk for impaired skin integrity will decrease 04/09/2024 0541 by Eliane Vola HERO, RN Outcome: Progressing 04/09/2024 0541 by Eliane Vola HERO, RN Outcome: Progressing   Problem: Tissue Perfusion: Goal: Adequacy of tissue perfusion will improve 04/09/2024 0541 by Eliane Vola HERO, RN Outcome: Progressing 04/09/2024 0541 by Eliane Vola HERO, RN Outcome: Progressing

## 2024-04-09 NOTE — Progress Notes (Signed)
 Physical Therapy Treatment Patient Details Name: [name] [name] MRN: [id number] DOB: Apr 07, 1966 Today's Date: 04/09/2024   History of Present Illness Pt is a 58 y/o male presenting on 10/24 for fall and chronic back pain. 10/19 psychiatry consulted due to suicidal thoughts, cleared on 10/22. Change in mental status due to acute delirium, and admitted from ED with acute metabolic encephalopathy. CT negative, EEG negative, MRI pending, chest x ray with hypoventilation. PMH includes: bipolar disorder type 1, tardive dyskinesia, PTSD, MDD, CAD, DM2, OA, tremors.    PT Comments  PT - Cognition Comments: AxO x 1 pleasant and cooperative.  Following all VC's with increased time.  Slow to move initially. Assisted OOB to bathroom then in hallway.  General bed mobility comments: required increased time and assist with upper body.  Moving slow this morning.  Required use of rail and HOB elevated. General transfer comment: VCs hand placement, sit to stand x 2, min A to power up from lower toilet height.  Assisted withstatic standing balance while assisting with peri care after a small BM.  Static standing at sink to wash noted mild remors and posterior lean.  HIGH FALL RISK. General Gait Details: tolerated a great distance but requires assist for balance as Pt still presents with tremors, freezing at doorways and festination.  HIGH FALL RISK. Positioned in recliner and set up lunch tray for Pt to self feed.    If plan is discharge home, recommend the following: Assistance with cooking/housework;Assist for transportation;Help with stairs or ramp for entrance;Direct supervision/assist for medications management;A little help with walking and/or transfers;A little help with bathing/dressing/bathroom;Direct supervision/assist for financial management   Can travel by private vehicle     Yes  Equipment Recommendations  None recommended by PT    Recommendations for Other Services       Precautions /  Restrictions Precautions Precautions: Fall Precaution/Restrictions Comments: decr awareness of balance deficits Restrictions Weight Bearing Restrictions Per Provider Order: No Other Position/Activity Restrictions: chronic back pain, history of Parkinson's disease     Mobility  Bed Mobility   Bed Mobility: Sit to Supine     Supine to sit: Min assist, Mod assist     General bed mobility comments: required increased time and assist with upper body.  Moving slow this morning.  Required use of rail and HOB elevated.    Transfers Overall transfer level: Needs assistance Equipment used: Rolling walker (2 wheels) Transfers: Sit to/from Stand Sit to Stand: Min assist           General transfer comment: VCs hand placement, sit to stand x 2, min A to power up from lower toilet height.  Assisted withstatic standing balance while assisting with peri care after a small BM.  Static standing at sink to wash noted mild remors and posterior lean.  HIGH FALL RISK.    Ambulation/Gait Ambulation/Gait assistance: Min assist Gait Distance (Feet): 150 Feet Assistive device: Rolling walker (2 wheels) Gait Pattern/deviations: Step-through pattern, Decreased stride length, Narrow base of support, Scissoring Gait velocity: decreased     General Gait Details: tolerated a great distance but requires assist for balance as Pt still presents with tremors, freezing at doorways and festination.  HIGH FALL RISK.   Stairs             Wheelchair Mobility     Tilt Bed    Modified Rankin (Stroke Patients Only)       Balance  Communication    Cognition Arousal: Alert Behavior During Therapy: Flat affect   PT - Cognitive impairments: No apparent impairments                       PT - Cognition Comments: AxO x 1 pleasant and cooperative.  Following all VC's with increased time.  Slow to move initially.         Cueing    Exercises      General Comments        Pertinent Vitals/Pain Pain Assessment Pain Assessment: Faces Pain Location: chronic back pain    Home Living                          Prior Function            PT Goals (current goals can now be found in the care plan section) Progress towards PT goals: Progressing toward goals    Frequency           PT Plan      Co-evaluation              AM-PAC PT 6 Clicks Mobility   Outcome Measure  Help needed turning from your back to your side while in a flat bed without using bedrails?: A Little Help needed moving from lying on your back to sitting on the side of a flat bed without using bedrails?: A Little Help needed moving to and from a bed to a chair (including a wheelchair)?: A Little Help needed standing up from a chair using your arms (e.g., wheelchair or bedside chair)?: A Little Help needed to walk in hospital room?: A Little Help needed climbing 3-5 steps with a railing? : A Little 6 Click Score: 18    End of Session Equipment Utilized During Treatment: Gait belt Activity Tolerance: Patient tolerated treatment well Patient left: in chair;with call bell/phone within reach;with chair alarm set;Other (comment) (tele sitter)   PT Visit Diagnosis: History of falling (Z91.81);Unsteadiness on feet (R26.81);Other abnormalities of gait and mobility (R26.89);Muscle weakness (generalized) (M62.81);Repeated falls (R29.6);Pain     Time: 8871-8842 PT Time Calculation (min) (ACUTE ONLY): 29 min  Charges:    $Gait Training: 8-22 mins $Therapeutic Activity: 8-22 mins PT General Charges $$ ACUTE PT VISIT: 1 Visit                     Katheryn Leap  PTA Acute  Rehabilitation Services Office M-F          [phone]

## 2024-04-09 NOTE — Progress Notes (Signed)
  Daily Progress Note   Patient Name: [name] [name]       Date: 04/09/2024 DOB: 1965/11/17  Age: 58 y.o. MRN#: [id number] Attending Physician: Kathrin Mignon DASEN, MD Primary Care Physician: [name], [name] Admit Date: 03/02/2024 Length of Stay: 33 days  Reason for Consultation/Follow-up: Establishing goals of care  Subjective:   Resting in bed No acute distress.  TOC working with DSS regarding legal guardian engineer, materials.   Objective:   Vital Signs:  BP 92/65 (BP Location: Left Arm)   Pulse (!) 55   Temp 97.7 F (36.5 C)   Resp 17   Ht 5' 10 (1.778 m)   Wt 83 kg   SpO2 97%   BMI 26.26 kg/m   Physical Exam: General: Awake comfortable Cardiovascular: RRR Respiratory: no increased work of breathing noted, not in respiratory distress Abdomen: not distended Neuro: Awake  Assessment & Plan:   Assessment: Patient is a 58 year old male with a past medical history of type I bipolar disorder, tardive dyskinesia, PTSD, MDD, GAD, tremor, frequent falls, and hyperlipidemia who was brought to the ED on 02/25/2023 by EMS for talking crazy.  Patient's roommate/friend reported patient was having frequent falls, erratic behavior, and difficulty caring for himself at home; roommate had also overheard patient stating that he may kill himself which was concerning since he has access to 2 guns in the house.  Initially patient was endorsing suicidal thoughts and IVC was obtained by psychiatry though psychiatry cleared for discharge on 02/29/2024.  Patient then noted to be encephalopathic and so was admitted to the hospital on 03/02/2024.  Hospitalization further complicated by hypotension requiring ICU admission though has currently resolved.  TOC involved to seek guardianship for patient as no family or friends willing to make decisions on patient's behalf.  Palliative medicine team consulted to assist with complex medical decision making.   Recommendations/Plan: # Complex medical decision  making/goals of care:   - Patient unable to participate in complex medical decision making due to underlying medical status.  Patient does not have friends or family willing to make decisions on his behalf.   Psychiatry reinvolved and patient deemed to lack capacity at present. Most recent psychiatry note reviewed.  Agree with seeking guardianship, TOC following.  My colleague Dr Clayton has previously discussed with hospitalist about two-physician DNR based on patient's multiple medical comorbidities.  Continuing medically appropriate and reasonable medical interventions at this time and these are being done according to excepted practice and standards of care.  In the event of cardiac or respiratory arrest, an attempt at resuscitation would be unsuccessful in returning this patient to an already fragile state of health and cause unnecessary suffering in a patient with these multiple underlying medical comorbidities. Support placing a maintaining a DNR order and support the care plan as outlined by the primary service.   # Psycho-social/Spiritual Support:  - TOC seeking guardianship/working with DSS - TOC note reviewed.    # Discharge Planning:  awaiting placement.   Thank you for allowing the palliative care team to participate in the care [name].   Low MDM [name].  Palliative Care Provider PMT # [phone]  If patient remains symptomatic despite maximum doses, please call PMT at [phone] between 0700 and 1900. Outside of these hours, please call attending, as PMT does not have night coverage.

## 2024-04-09 NOTE — Progress Notes (Signed)
 PROGRESS NOTE  [name] OHLIN FMW:[id number] DOB: Apr 22, 1966   PCP: [name], [name]  Patient is from: Home.  Lives with friend, [name] [name].  No immediate family members.  Only one estranged brother that does not want to talk to him.  DOA: 03/02/2024 LOS: 33  Chief complaints Chief Complaint  Patient presents with   Placement     Brief Narrative / Interim history: 58 year old M with PMH of type I bipolar disorder, tardive dyskinesia, PTSD, MDD, GAD, DM-2, tremor, frequent falls and HLD brought to ED by EMS on 10/18 due to talking crazy.   Per EDP note, roommate/friend reported problems with the patient having frequent falls [name] the house, difficulty caring for himself, erratic behavior including today where he had a very loud outburst [name] front of her, calling her names, and then was overheard stating he may kill himself.  She is concerned because he has access to 2 guns [name] the house.     Patient was boarded [name] the emergency room since 10/18 10/19, psychiatry consulted as patient was endorsing suicidal thoughts-IVC 10/22, psychiatry cleared for discharge. 10/24, patient was noted to be encephalopathic so admitted to the hospital.  11/4, MRI under sedation, transient hypotension and altered mental status-overnight [name] ICU. 11/5, clinically improved.  Sedation wore off.  Transferred back to MedSurg bed.    Has no capacity. Two-physician DNR on 03/27/2024 after discussion with palliative medicine given patient's multiple medical comorbidity. APS involved.  Waiting on placement.   Subjective: Seen and examined earlier this morning.  No major events overnight or this morning.  Sleeping.   Assessment and plan: Acute metabolic encephalopathy [name] a patient with underlying psychiatric disorder: Multifactorial including positive underlying cognitive impairment, dehydration, delirium and iatrogenic.  CT head and MRI brain without acute finding to explain patient's behavioral issue or  encephalopathy.  TSH, B12, EEG x 3 unrevealing.  CBC on 10/24 with signs of hemoconcentration.  Improved. -Reorientation and delirium precaution   Bipolar 1 disorder/MDD/GAD/PTSD: Patient remains combative, aggressive and kicking and hitting staff at times even with the adjustment of his psych medications by psychiatry.  Currently requiring restraints.   -Continue Cogentin  1 mg bid, Atarax  50 mg qhs, Ingrezza  80mg  daily, Cymbalta  40 mg bid  -Psychiatry increase Depakote  from 750 mg twice daily to 625 mg 3 times daily.  Depakote  level was normal at 79. -Continue trazodone  and Haldol  as needed.   Medical decision making capacity: Oriented to self and place for most part but not time.  Has no insight into his medical condition and treatment options.  Evaluated by psychiatry on 11/23.  He did not demonstrate capacity to make his own medical decision.  APS involved.  Hypotension 2/2 sedation: Resolved. Essential hypertension: Normotensive. -Continue low-dose amlodipine . -Continue home Inderal -decreased to twice daily given borderline heart rate. -Continue holding lisinopril  due to risk for AKI.  NIDDM-2: A1c 6.3%.  CBG within acceptable range.  CBG monitoring discontinued. -Continue low-dose metformin .   Physical deconditioning/recurrent falls  -Therapy recommended SNF.   Hypoxemia: Resolved.  On room air.  Hyperlipidemia - Continue statin.   Vitamin D  deficiency: -Continue vitamin D  supplementation  Dehydration: dry and chapped lips - IV fluid bolus intermittently.  Goal of care: Poor long-term prognosis due to underlying complex mental health and very limited social support.  He has not demonstrated capacity to make his own medical decisions so far.  Has no immediate family member.  Reportedly, only one estranged brother that does not want to talk to him.  Lived with friend, [name] [name] before this admission. Two person DNR after discussion with palliative medicine on 11/18. - APS involved  for guardianship.  Increased nutrient needs Body mass index is 26.26 kg/m. Nutrition Problem: Increased nutrient needs Etiology: acute illness Signs/Symptoms: estimated needs Interventions: Ensure Enlive (each supplement provides 350kcal and 20 grams of protein), MVI   DVT prophylaxis:  enoxaparin  (LOVENOX ) injection 40 mg Start: 03/08/24 0800  Code Status: DNR. Family Communication: None at bedside Level of care: Med-Surg Status is: Inpatient Remains inpatient appropriate because: SNF bed   Final disposition: SNF   35 minutes with more than 50% spent [name] reviewing records, counseling patient/family and coordinating care.  Consultants:  Psychiatry Palliative medicine  Procedures: None  Microbiology summarized: MRSA PCR screen positive.  Objective: Vitals:   04/08/24 0920 04/08/24 1733 04/08/24 1908 04/09/24 0458  BP:  113/63 111/82 92/65  Pulse: (!) 55 66 75 (!) 55  Resp:  15 14 17   Temp:  98.4 F (36.9 C) (!) 97.5 F (36.4 C) 97.7 F (36.5 C)  TempSrc:   Oral   SpO2:  94% 96% 97%  Weight:      Height:        Examination:  GENERAL: No apparent distress.  Nontoxic. HEENT: Chapped and dry lips.  Vision and hearing grossly intact.  NECK: Supple.  No apparent JVD.  RESP:  No IWOB.  On room air. CVS:  RRR. Heart sounds normal.  ABD/GI/GU: BS+. Abd soft, NTND.  MSK/EXT:  Moves extremities.  SKIN: no apparent skin lesion or wound NEURO: Sleepy.  No apparent focal neuro deficit. PSYCH: Currently sleepy.  Sch Meds:  Scheduled Meds:  amLODipine   5 mg Oral Daily   atorvastatin   20 mg Oral Daily   benztropine   1 mg Oral BID   divalproex   625 mg Oral Q8H   DULoxetine   40 mg Oral BID   enoxaparin  (LOVENOX ) injection  40 mg Subcutaneous Q24H   feeding supplement  237 mL Oral BID BM   hydrOXYzine   50 mg Oral QHS   metFORMIN   500 mg Oral BID WC   multivitamin with minerals  1 tablet Oral Daily   propranolol   10 mg Oral BID   valbenazine   80 mg Oral Daily    Continuous Infusions:   PRN Meds:.acetaminophen  **OR** acetaminophen , artificial tears, benzocaine , bisacodyl , haloperidol  **OR** haloperidol  lactate, lip balm, ondansetron  **OR** ondansetron  (ZOFRAN ) IV, mouth rinse, senna-docusate, traZODone   Antimicrobials: Anti-infectives (From admission, onward)    Start     Dose/Rate Route Frequency Ordered Stop   03/12/24 1515  amoxicillin -clavulanate (AUGMENTIN ) 875-125 MG per tablet 1 tablet        1 tablet Oral Every 12 hours 03/12/24 1420 03/17/24 0959   03/12/24 1515  doxycycline  (VIBRA -TABS) tablet 100 mg  Status:  Discontinued        100 mg Oral Every 12 hours 03/12/24 1420 03/15/24 1100        I have personally reviewed the following labs and images: CBC: No results for input(s): WBC, NEUTROABS, HGB, HCT, MCV, PLT [name] the last 168 hours.  BMP &GFR No results for input(s): NA, K, CL, CO2, GLUCOSE, BUN, CREATININE, CALCIUM , MG, PHOS [name] the last 168 hours.  Invalid input(s): GFRCG  Estimated Creatinine Clearance: 85.5 mL/min (by C-G formula based on SCr of 0.96 mg/dL). Liver & Pancreas: No results for input(s): AST, ALT, ALKPHOS, BILITOT, PROT, ALBUMIN  [name] the last 168 hours.  No results for input(s): LIPASE, AMYLASE [name] the last 168 hours. No results  for input(s): AMMONIA [name] the last 168 hours. Diabetic: No results for input(s): HGBA1C [name] the last 72 hours. No results for input(s): GLUCAP [name] the last 168 hours.  Cardiac Enzymes: No results for input(s): CKTOTAL, CKMB, CKMBINDEX, TROPONINI [name] the last 168 hours.  No results for input(s): PROBNP [name] the last 8760 hours. Coagulation Profile: No results for input(s): INR, PROTIME [name] the last 168 hours. Thyroid  Function Tests: No results for input(s): TSH, T4TOTAL, FREET4, T3FREE, THYROIDAB [name] the last 72 hours. Lipid Profile: No results for input(s): CHOL, HDL, LDLCALC, TRIG, CHOLHDL,  LDLDIRECT [name] the last 72 hours. Anemia Panel: No results for input(s): VITAMINB12, FOLATE, FERRITIN, TIBC, IRON, RETICCTPCT [name] the last 72 hours. Urine analysis:    Component Value Date/Time   COLORURINE YELLOW 03/07/2024 2022   APPEARANCEUR CLEAR 03/07/2024 2022   LABSPEC 1.024 03/07/2024 2022   PHURINE 6.0 03/07/2024 2022   GLUCOSEU NEGATIVE 03/07/2024 2022   HGBUR NEGATIVE 03/07/2024 2022   BILIRUBINUR NEGATIVE 03/07/2024 2022   KETONESUR 5 (A) 03/07/2024 2022   PROTEINUR NEGATIVE 03/07/2024 2022   UROBILINOGEN 1.0 10/24/2008 1155   NITRITE NEGATIVE 03/07/2024 2022   LEUKOCYTESUR TRACE (A) 03/07/2024 2022   Sepsis Labs: Invalid input(s): PROCALCITONIN, LACTICIDVEN  Microbiology: No results found for this or any previous visit (from the past 240 hours).   Radiology Studies: No results found.    [name] [name] T. Cosby Proby [facility]  If 7PM-7AM, please contact night-coverage [email or web address] 04/09/2024, 10:56 AM

## 2024-04-10 ENCOUNTER — Ambulatory Visit

## 2024-04-10 DIAGNOSIS — M545 Low back pain, unspecified: Secondary | ICD-10-CM | POA: Diagnosis not present

## 2024-04-10 DIAGNOSIS — Z7189 Other specified counseling: Secondary | ICD-10-CM | POA: Diagnosis not present

## 2024-04-10 DIAGNOSIS — G9340 Encephalopathy, unspecified: Secondary | ICD-10-CM | POA: Diagnosis not present

## 2024-04-10 DIAGNOSIS — F319 Bipolar disorder, unspecified: Secondary | ICD-10-CM | POA: Diagnosis not present

## 2024-04-10 DIAGNOSIS — R531 Weakness: Secondary | ICD-10-CM | POA: Diagnosis not present

## 2024-04-10 MED ORDER — DULOXETINE HCL 30 MG PO CPEP
60.0000 mg | ORAL_CAPSULE | Freq: Two times a day (BID) | ORAL | Status: DC
  Administered 2024-04-10 – 2024-06-07 (×116): 60 mg via ORAL
  Filled 2024-04-10 (×103): qty 2

## 2024-04-10 NOTE — Progress Notes (Signed)
 PROGRESS NOTE  [name] [name] FMW:[id number] DOB: 05/30/1965   PCP: [name], [name]  Patient is from: Home.  Lives with friend, [name].  No immediate family members.  Only one estranged brother that does not want to talk to him.  DOA: 03/02/2024 LOS: 34  Chief complaints Chief Complaint  Patient presents with   Placement     Brief Narrative / Interim history: 58 year old M with PMH of type I bipolar disorder, tardive dyskinesia, PTSD, MDD, GAD, DM-2, tremor, frequent falls and HLD brought to ED by EMS on 10/18 due to talking crazy.   Per EDP note, roommate/friend reported problems with the patient having frequent falls in the house, difficulty caring for himself, erratic behavior including today where he had a very loud outburst in front of her, calling her names, and then was overheard stating he may kill himself.  She is concerned because he has access to 2 guns in the house.     Patient was boarded in the emergency room since 10/18 10/19, psychiatry consulted as patient was endorsing suicidal thoughts-IVC 10/22, psychiatry cleared for discharge. 10/24, patient was noted to be encephalopathic so admitted to the hospital.  11/4, MRI under sedation, transient hypotension and altered mental status-overnight in ICU. 11/5, clinically improved.  Sedation wore off.  Transferred back to MedSurg bed.    Has no capacity. Two-physician DNR on 03/27/2024 after discussion with palliative medicine given patient's multiple medical comorbidity. APS involved.  Waiting on placement.   Subjective: Seen and examined earlier this morning.  No major events overnight or this morning.  Sleepy but wakes to voice.  Awake and oriented to self, place and date of birth.  No complaints.   Assessment and plan: Acute metabolic encephalopathy in a patient with underlying psychiatric disorder: Multifactorial including positive underlying cognitive impairment, dehydration, delirium and iatrogenic.  CT  head and MRI brain without acute finding to explain patient's behavioral issue or encephalopathy.  TSH, B12, EEG x 3 unrevealing.  CBC on 10/24 with signs of hemoconcentration.  Improved. -Reorientation and delirium precaution   Bipolar 1 disorder/MDD/GAD/PTSD: Patient remains combative, aggressive and kicking and hitting staff at times even with the adjustment of his psych medications by psychiatry.  Currently requiring restraints.   -Continue Cogentin  1 mg bid, Atarax  50 mg qhs, Ingrezza  80mg  daily, Cymbalta  40 mg bid  -Psychiatry increase Depakote  from 750 mg twice daily to 625 mg 3 times daily.  Depakote  level was normal at 79. -Continue trazodone  and Haldol  as needed.   Medical decision making capacity: Oriented to self and place for most part but not time.  Has no insight into his medical condition and treatment options.  Evaluated by psychiatry on 11/23.  He did not demonstrate capacity to make his own medical decision.  APS involved.  Hypotension 2/2 sedation: Resolved. Essential hypertension: Normotensive. -Continue low-dose amlodipine . -Continue home Inderal -decreased to twice daily given borderline heart rate. -Continue holding lisinopril  due to risk for AKI.  NIDDM-2: A1c 6.3%.  CBG within acceptable range.  CBG monitoring discontinued. -Continue low-dose metformin .   Physical deconditioning/recurrent falls  -Therapy recommended SNF.   Hypoxemia: Resolved.  On room air.  Hyperlipidemia - Continue statin.   Vitamin D  deficiency: -Continue vitamin D  supplementation  Dehydration: dry and chapped lips - IV fluid bolus intermittently.  Goal of care: Poor long-term prognosis due to underlying complex mental health and very limited social support.  He has not demonstrated capacity to make his own medical decisions so far.  Has  no immediate family member.  Reportedly, only one estranged brother that does not want to talk to him. Lived with friend, [name] before this admission.  Two person DNR after discussion with palliative medicine on 11/18. - APS involved for guardianship.  Increased nutrient needs Body mass index is 26.26 kg/m. Nutrition Problem: Increased nutrient needs Etiology: acute illness Signs/Symptoms: estimated needs Interventions: Ensure Enlive (each supplement provides 350kcal and 20 grams of protein), MVI   DVT prophylaxis:  enoxaparin  (LOVENOX ) injection 40 mg Start: 03/08/24 0800  Code Status: DNR. Family Communication: None at bedside Level of care: Med-Surg Status is: Inpatient Remains inpatient appropriate because: SNF bed   Final disposition: SNF   35 minutes with more than 50% spent in reviewing records, counseling patient/family and coordinating care.  Consultants:  Psychiatry Palliative medicine  Procedures: None  Microbiology summarized: MRSA PCR screen positive.  Objective: Vitals:   04/09/24 1244 04/09/24 1245 04/09/24 2001 04/10/24 0553  BP: 101/74 101/74 105/69 100/65  Pulse: 77  65 (!) 56  Resp:   18 18  Temp:   97.7 F (36.5 C) 97.6 F (36.4 C)  TempSrc:      SpO2:   94% 97%  Weight:      Height:        Examination:  GENERAL: No apparent distress.  Nontoxic. HEENT: Chapped and dry lips.  Vision and hearing grossly intact.  NECK: Supple.  No apparent JVD.  RESP:  No IWOB.  On room air. CVS:  RRR. Heart sounds normal.  ABD/GI/GU: BS+. Abd soft, NTND.  MSK/EXT:  Moves extremities.  SKIN: no apparent skin lesion or wound NEURO: Sleepy but wakes to voice.  Awake and oriented to self, place and date of birth.  Follows commands. PSYCH: Calm.  No agitation or distress.  Sch Meds:  Scheduled Meds:  amLODipine   5 mg Oral Daily   atorvastatin   20 mg Oral Daily   benztropine   1 mg Oral BID   divalproex   625 mg Oral Q8H   DULoxetine   40 mg Oral BID   enoxaparin  (LOVENOX ) injection  40 mg Subcutaneous Q24H   feeding supplement  237 mL Oral BID BM   hydrOXYzine   50 mg Oral QHS   metFORMIN   500 mg  Oral BID WC   multivitamin with minerals  1 tablet Oral Daily   propranolol   10 mg Oral BID   valbenazine   80 mg Oral Daily   Continuous Infusions:   PRN Meds:.acetaminophen  **OR** acetaminophen , artificial tears, benzocaine , bisacodyl , haloperidol  **OR** haloperidol  lactate, lip balm, ondansetron  **OR** ondansetron  (ZOFRAN ) IV, mouth rinse, senna-docusate, traZODone   Antimicrobials: Anti-infectives (From admission, onward)    Start     Dose/Rate Route Frequency Ordered Stop   03/12/24 1515  amoxicillin -clavulanate (AUGMENTIN ) 875-125 MG per tablet 1 tablet        1 tablet Oral Every 12 hours 03/12/24 1420 03/17/24 0959   03/12/24 1515  doxycycline  (VIBRA -TABS) tablet 100 mg  Status:  Discontinued        100 mg Oral Every 12 hours 03/12/24 1420 03/15/24 1100        I have personally reviewed the following labs and images: CBC: No results for input(s): WBC, NEUTROABS, HGB, HCT, MCV, PLT in the last 168 hours.  BMP &GFR No results for input(s): NA, K, CL, CO2, GLUCOSE, BUN, CREATININE, CALCIUM , MG, PHOS in the last 168 hours.  Invalid input(s): GFRCG  Estimated Creatinine Clearance: 85.5 mL/min (by C-G formula based on SCr of 0.96 mg/dL). Liver & Pancreas:  No results for input(s): AST, ALT, ALKPHOS, BILITOT, PROT, ALBUMIN  in the last 168 hours.  No results for input(s): LIPASE, AMYLASE in the last 168 hours. No results for input(s): AMMONIA in the last 168 hours. Diabetic: No results for input(s): HGBA1C in the last 72 hours. No results for input(s): GLUCAP in the last 168 hours.  Cardiac Enzymes: No results for input(s): CKTOTAL, CKMB, CKMBINDEX, TROPONINI in the last 168 hours.  No results for input(s): PROBNP in the last 8760 hours. Coagulation Profile: No results for input(s): INR, PROTIME in the last 168 hours. Thyroid  Function Tests: No results for input(s): TSH, T4TOTAL, FREET4, T3FREE,  THYROIDAB in the last 72 hours. Lipid Profile: No results for input(s): CHOL, HDL, LDLCALC, TRIG, CHOLHDL, LDLDIRECT in the last 72 hours. Anemia Panel: No results for input(s): VITAMINB12, FOLATE, FERRITIN, TIBC, IRON, RETICCTPCT in the last 72 hours. Urine analysis:    Component Value Date/Time   COLORURINE YELLOW 03/07/2024 2022   APPEARANCEUR CLEAR 03/07/2024 2022   LABSPEC 1.024 03/07/2024 2022   PHURINE 6.0 03/07/2024 2022   GLUCOSEU NEGATIVE 03/07/2024 2022   HGBUR NEGATIVE 03/07/2024 2022   BILIRUBINUR NEGATIVE 03/07/2024 2022   KETONESUR 5 (A) 03/07/2024 2022   PROTEINUR NEGATIVE 03/07/2024 2022   UROBILINOGEN 1.0 10/24/2008 1155   NITRITE NEGATIVE 03/07/2024 2022   LEUKOCYTESUR TRACE (A) 03/07/2024 2022   Sepsis Labs: Invalid input(s): PROCALCITONIN, LACTICIDVEN  Microbiology: No results found for this or any previous visit (from the past 240 hours).   Radiology Studies: No results found.    [name] [name] T. Ayaat Jansma [facility]  If 7PM-7AM, please contact night-coverage [email or web address] 04/10/2024, 11:48 AM

## 2024-04-10 NOTE — Progress Notes (Signed)
 Mobility Specialist Progress Note:   04/10/24 1452  Mobility  Activity Ambulated with assistance  Level of Assistance Moderate assist, patient does 50-74%  Assistive Device Front wheel walker  Distance Ambulated (ft) 65 ft  Activity Response Tolerated well  Mobility Referral Yes  Mobility visit 1 Mobility  Mobility Specialist Start Time (ACUTE ONLY) 1417  Mobility Specialist Stop Time (ACUTE ONLY) 1435  Mobility Specialist Time Calculation (min) (ACUTE ONLY) 18 min   Pt was received in bed and agreed to mobility. Mod A sit to stand and ambulation. Pt had one bout of unsteadiness but recovered. Returned to bed with all needs met and call bell in reach. Bed alarm on. Left in room with RN.  [employer] - Mobility Specialist

## 2024-04-10 NOTE — TOC Progression Note (Signed)
 Transition of Care [facility]) - Progression Note    Patient Details  Name: [name] [name] MRN: [id number] Date of Birth: [birth year]  [facility] CM/SW Contact  Doneta Glenys DASEN, RN Phone Number: 04/10/2024, 1:12 PM  Clinical Narrative:    APS Shefi called to say that she will be coming tomorrow (12/3) about 10:15 to perform a mental capacity evaluation and request that patient not be sedated. CM update Dr. Kathrin and nurse.     Barriers to Discharge: SNF Pending bed offer               Expected Discharge Plan and Services                                               Social Drivers of Health (SDOH) Interventions SDOH Screenings   Food Insecurity: Patient Unable To Answer (03/07/2024)  Housing: Unknown (03/07/2024)  Transportation Needs: Patient Unable To Answer (03/07/2024)  Utilities: Patient Unable To Answer (03/07/2024)  Depression (PHQ2-9): High Risk (01/26/2024)  Social Connections: Unknown (09/22/2021)   Received from [facility]  Tobacco Use: Low Risk  (03/13/2024)    Readmission Risk Interventions     No data to display

## 2024-04-10 NOTE — Consult Note (Addendum)
 [facility] Psychiatric Consult Follow Up  Patient Name: .[name] [name]  MRN: [id number]  DOB: 09/04/1965  Consult Order details:  Orders (From admission, onward)     Start     Ordered   03/08/24 0958  IP CONSULT TO PSYCHIATRY       Ordering Provider: Madelyne Owen LABOR, MD  Provider:  (Not yet assigned)  Question Answer Comment  Location [facility]   Reason for Consult? adjustment of medications , admitted with delirium      03/08/24 0957             Mode of Visit: In person    Psychiatry Consult Evaluation  Service Date: April 10, 2024 LOS:  LOS: 34 days  Chief Complaint Delirium  Primary Psychiatric Diagnoses  Bipolar I Disorder 2.  Delirium resolved  Assessment  [name] [name] is a 58 y.o. male admitted: Medicallyfor 03/02/2024  3:27 PM for Acute Encephalopathy. He carries the psychiatric diagnoses of Bipolar I Disorder.  On initial examination, patient can be seen laying in bed with sitter at bedside. The interview is complicated by the patient's lethargy and being in and out of consciousness. He was oriented to person, place, and time but disoriented to situation. He was unable to report why he was currently in the hospital. Per the sitter at bedside she was informed that the patient had a history of aggression but he had behaved well this morning. He was eating fine and the plan was to make sure that he remained awake during the day shift. The patient denied any SI/HI/AVH or paranoid thoughts about the staff. He stated that he was unsure where he would be able to go once he was discharged from the hospital. Please see plan below for detailed recommendations.   04/03/2024:  Since the most recent medication adjustment--Depakote  increased from 750 mg PO BID to 625 mg PO TID (5 days ago)--the patient has demonstrated no further disruptive behaviors. Current valproic acid  level is 76, decreased slightly from 79. Given the clinical response, a  therapeutic range of 60-80 appears appropriate for this patient with possible dementia-related behaviors.  On evaluation today, the patient is calm and appropriate. He attempted to mute the television upon this provider entering the room but instead pressed the nurse call button, stating, "Yeah, can you make all this beeping stop?" He greeted the provider appropriately and denied any concerns. He was observed with food on the table and stated, "I'm finished with this here."  04/10/2024: Patient seen and reassessed following psychiatry re-consult for severe depression. He reports feeling depressed and expresses a desire to restart his previous medications. Chart review confirms that his psychiatric medications have already been restarted and are currently at moderate therapeutic doses. He is presently receiving Depakote  625 mg PO TID, duloxetine  40 mg PO BID, trazodone  50 mg PO QHS PRN, valbenazine  (Ingrezza ) 80 mg PO daily, and benztropine  (Cogentin ) 1 mg PO daily. Due to concurrent use of Ingrezza  and Cogentin , the benztropine  will be discontinued at this time. The patient reports moderate improvement in extrapyramidal and tardive dyskinesia symptoms. No additional PRN medications have been required during his 39-day hospital stay. A Depakote  level will be obtained in the morning to ensure it is not elevated or contributing to daytime sedation. Duloxetine  will be increased to 60 mg PO BID to further target depressive symptoms. No other medication adjustments are indicated at this time. Psychiatry will sign off, and the team may re-consult as needed for any changes  in mental status or behavior.  Diagnoses:  Active Hospital problems: Principal Problem:   Acute encephalopathy Active Problems:   Chronic low back pain without sciatica   Acute delirium   Generalized weakness   Bipolar 1 disorder (HCC)   Goals of care, counseling/discussion   Counseling and coordination of care   Palliative care  encounter    Plan   ## Psychiatric Medication Recommendations:   -Discontinue benztropine  (Cogentin ) due to concurrent use of valbenazine  (Ingrezza ).  -Increase duloxetine  (Cymbalta ) to 60 mg PO BID to further address depressive symptoms.  -Obtain Depakote  level in the morning to monitor for potential supratherapeutic levels or sedation.  -No other medication changes at this time.  -Continue supportive therapy, routine monitoring, and safety precautions per unit protocol.  ## Medical Decision Making Capacity: Patient lacks capacity to make medical decisions at this time. Would benefit from a competency evaluation.    EKG on 10/24 of 474  ## Further Work-up:  -- Per primary team -- Pertinent labwork reviewed earlier this admission includes:  Recent Results (from the past 2160 hours)  Urinalysis, Routine w reflex microscopic -Urine, Clean Catch     Status: None   Collection Time: 02/19/24  7:48 AM  Result Value Ref Range   Color, Urine YELLOW YELLOW   APPearance CLEAR CLEAR   Specific Gravity, Urine 1.018 1.005 - 1.030   pH 6.0 5.0 - 8.0   Glucose, UA NEGATIVE NEGATIVE mg/dL   Hgb urine dipstick NEGATIVE NEGATIVE   Bilirubin Urine NEGATIVE NEGATIVE   Ketones, ur NEGATIVE NEGATIVE mg/dL   Protein, ur NEGATIVE NEGATIVE mg/dL   Nitrite NEGATIVE NEGATIVE   Leukocytes,Ua NEGATIVE NEGATIVE    Comment: Performed at [facility], [address], [place], [place] [address]  CBC with Differential/Platelet     Status: None   Collection Time: 02/19/24  8:24 AM  Result Value Ref Range   WBC 8.4 4.0 - 10.5 K/uL   RBC 5.03 4.22 - 5.81 MIL/uL   Hemoglobin 14.2 13.0 - 17.0 g/dL   HCT 57.1 60.9 - 47.9 %   MCV 85.1 80.0 - 100.0 fL   MCH 28.2 26.0 - 34.0 pg   MCHC 33.2 30.0 - 36.0 g/dL   RDW 85.9 88.4 - 84.4 %   Platelets 217 150 - 400 K/uL   nRBC 0.0 0.0 - 0.2 %   Neutrophils Relative % 54 %   Neutro Abs 4.5 1.7 - 7.7 K/uL   Lymphocytes Relative 33 %   Lymphs Abs  2.8 0.7 - 4.0 K/uL   Monocytes Relative 11 %   Monocytes Absolute 0.9 0.1 - 1.0 K/uL   Eosinophils Relative 1 %   Eosinophils Absolute 0.0 0.0 - 0.5 K/uL   Basophils Relative 1 %   Basophils Absolute 0.1 0.0 - 0.1 K/uL   Immature Granulocytes 0 %   Abs Immature Granulocytes 0.03 0.00 - 0.07 K/uL    Comment: Performed at [facility], [address] [place], [place] [address]  Comprehensive metabolic panel with GFR     Status: Abnormal   Collection Time: 02/19/24  8:24 AM  Result Value Ref Range   Sodium 136 135 - 145 mmol/L   Potassium 4.1 3.5 - 5.1 mmol/L   Chloride 99 98 - 111 mmol/L   CO2 26 22 - 32 mmol/L   Glucose, Bld 122 (H) 70 - 99 mg/dL    Comment: Glucose reference range applies only to samples taken after fasting for at least 8  hours.   BUN 19 6 - 20 mg/dL   Creatinine, Ser 8.95 0.61 - 1.24 mg/dL   Calcium  9.8 8.9 - 10.3 mg/dL   Total Protein 6.9 6.5 - 8.1 g/dL   Albumin  4.2 3.5 - 5.0 g/dL   AST 17 15 - 41 U/L   ALT 10 0 - 44 U/L   Alkaline Phosphatase 49 38 - 126 U/L   Total Bilirubin 0.5 0.0 - 1.2 mg/dL   GFR, Estimated >39 >39 mL/min    Comment: (NOTE) Calculated using the CKD-EPI Creatinine Equation (2021)    Anion gap 11 5 - 15    Comment: Performed at [facility], [address], [place], [place] [address]  Comprehensive metabolic panel     Status: Abnormal   Collection Time: 02/25/24  7:59 PM  Result Value Ref Range   Sodium 135 135 - 145 mmol/L   Potassium 4.2 3.5 - 5.1 mmol/L   Chloride 99 98 - 111 mmol/L   CO2 23 22 - 32 mmol/L   Glucose, Bld 106 (H) 70 - 99 mg/dL    Comment: Glucose reference range applies only to samples taken after fasting for at least 8 hours.   BUN 17 6 - 20 mg/dL   Creatinine, Ser 8.80 0.61 - 1.24 mg/dL   Calcium  9.8 8.9 - 10.3 mg/dL   Total Protein 7.3 6.5 - 8.1 g/dL   Albumin  3.9 3.5 - 5.0 g/dL   AST 18 15 - 41 U/L   ALT 17 0 - 44 U/L   Alkaline Phosphatase 57 38 - 126 U/L   Total  Bilirubin 1.2 0.0 - 1.2 mg/dL   GFR, Estimated >39 >39 mL/min    Comment: (NOTE) Calculated using the CKD-EPI Creatinine Equation (2021)    Anion gap 13 5 - 15    Comment: Performed at [facility], [address], [place], [place] [address]  CBC     Status: None   Collection Time: 02/25/24  7:59 PM  Result Value Ref Range   WBC 9.7 4.0 - 10.5 K/uL   RBC 5.73 4.22 - 5.81 MIL/uL   Hemoglobin 16.4 13.0 - 17.0 g/dL   HCT 51.6 60.9 - 47.9 %   MCV 84.3 80.0 - 100.0 fL   MCH 28.6 26.0 - 34.0 pg   MCHC 34.0 30.0 - 36.0 g/dL   RDW 85.9 88.4 - 84.4 %   Platelets 288 150 - 400 K/uL   nRBC 0.0 0.0 - 0.2 %    Comment: Performed at [facility], [address] [place], [place] [address]  CBG monitoring, ED     Status: Abnormal   Collection Time: 02/25/24  8:01 PM  Result Value Ref Range   Glucose-Capillary 102 (H) 70 - 99 mg/dL    Comment: Glucose reference range applies only to samples taken after fasting for at least 8 hours.  Ammonia     Status: None   Collection Time: 02/25/24  8:33 PM  Result Value Ref Range   Ammonia 34 9 - 35 umol/L    Comment: HEMOLYSIS AT THIS LEVEL MAY AFFECT RESULT Performed at [facility], [address] [place], [place] [address]   Resp panel by RT-PCR (RSV, Flu A&B, Covid) Anterior Nasal Swab     Status: None   Collection Time: 02/25/24  8:45 PM   Specimen: Anterior Nasal Swab  Result Value Ref Range   SARS Coronavirus 2 by RT PCR NEGATIVE NEGATIVE  Influenza A by PCR NEGATIVE NEGATIVE   Influenza B by PCR NEGATIVE NEGATIVE    Comment: (NOTE) The Xpert Xpress SARS-CoV-2/FLU/RSV plus assay is intended as an aid in the diagnosis of influenza from Nasopharyngeal swab specimens and should not be used as a sole basis for treatment. Nasal washings and aspirates are unacceptable for Xpert Xpress SARS-CoV-2/FLU/RSV testing.  Fact Sheet for Patients: bloggercourse.com  Fact Sheet for Healthcare  Providers: seriousbroker.it  This test is not yet approved or cleared by the [place]  FDA and has been authorized for detection and/or diagnosis of SARS-CoV-2 by FDA under an Emergency Use Authorization (EUA). This EUA will remain in effect (meaning this test can be used) for the duration of the COVID-19 declaration under Section 564(b)(1) of the Act, 21 U.S.C. section 360bbb-3(b)(1), unless the authorization is terminated or revoked.     Resp Syncytial Virus by PCR NEGATIVE NEGATIVE    Comment: (NOTE) Fact Sheet for Patients: bloggercourse.com  Fact Sheet for Healthcare Providers: seriousbroker.it  This test is not yet approved or cleared by the [place]  FDA and has been authorized for detection and/or diagnosis of SARS-CoV-2 by FDA under an Emergency Use Authorization (EUA). This EUA will remain in effect (meaning this test can be used) for the duration of the COVID-19 declaration under Section 564(b)(1) of the Act, 21 U.S.C. section 360bbb-3(b)(1), unless the authorization is terminated or revoked.  Performed at [facility], [address] [place], [place] [address]   Ethanol     Status: None   Collection Time: 02/25/24  9:00 PM  Result Value Ref Range   Alcohol , Ethyl (B) <15 <15 mg/dL    Comment: (NOTE) For medical purposes only. Performed at [facility], [address], [place], [place] [address]   Urinalysis, Routine w reflex microscopic -Urine, Clean Catch     Status: Abnormal   Collection Time: 02/25/24 11:30 PM  Result Value Ref Range   Color, Urine YELLOW YELLOW   APPearance CLEAR CLEAR   Specific Gravity, Urine 1.018 1.005 - 1.030   pH 5.0 5.0 - 8.0   Glucose, UA NEGATIVE NEGATIVE mg/dL   Hgb urine dipstick NEGATIVE NEGATIVE   Bilirubin Urine NEGATIVE NEGATIVE   Ketones, ur 5 (A) NEGATIVE mg/dL   Protein, ur NEGATIVE NEGATIVE mg/dL   Nitrite NEGATIVE  NEGATIVE   Leukocytes,Ua NEGATIVE NEGATIVE    Comment: Performed at [facility], [address], [place], [place] [address]  Rapid urine drug screen (hospital performed)     Status: None   Collection Time: 02/25/24 11:30 PM  Result Value Ref Range   Opiates NONE DETECTED NONE DETECTED   Cocaine NONE DETECTED NONE DETECTED   Benzodiazepines NONE DETECTED NONE DETECTED   Amphetamines NONE DETECTED NONE DETECTED   Tetrahydrocannabinol NONE DETECTED NONE DETECTED   Barbiturates NONE DETECTED NONE DETECTED    Comment: (NOTE) DRUG SCREEN FOR MEDICAL PURPOSES ONLY.  IF CONFIRMATION IS NEEDED FOR ANY PURPOSE, NOTIFY LAB WITHIN 5 DAYS.  LOWEST DETECTABLE LIMITS FOR URINE DRUG SCREEN Drug Class                     Cutoff (ng/mL) Amphetamine and metabolites    1000 Barbiturate and metabolites    200 Benzodiazepine                 200 Opiates and metabolites        300 Cocaine and metabolites        300  THC                            50 Performed at [facility], [address], [place], [place] [address]   Basic metabolic panel     Status: Abnormal   Collection Time: 03/02/24  5:33 PM  Result Value Ref Range   Sodium 135 135 - 145 mmol/L   Potassium 4.5 3.5 - 5.1 mmol/L   Chloride 97 (L) 98 - 111 mmol/L   CO2 28 22 - 32 mmol/L   Glucose, Bld 98 70 - 99 mg/dL    Comment: Glucose reference range applies only to samples taken after fasting for at least 8 hours.   BUN 18 6 - 20 mg/dL   Creatinine, Ser 8.86 0.61 - 1.24 mg/dL   Calcium  10.0 8.9 - 10.3 mg/dL   GFR, Estimated >39 >39 mL/min    Comment: (NOTE) Calculated using the CKD-EPI Creatinine Equation (2021)    Anion gap 11 5 - 15    Comment: Performed at [facility], [address], [place], [place] [address]  CBC with Differential     Status: Abnormal   Collection Time: 03/02/24  5:33 PM  Result Value Ref Range   WBC 8.7 4.0 - 10.5 K/uL   RBC 6.05 (H) 4.22 - 5.81 MIL/uL   Hemoglobin 17.1 (H)  13.0 - 17.0 g/dL   HCT 48.0 60.9 - 47.9 %   MCV 85.8 80.0 - 100.0 fL   MCH 28.3 26.0 - 34.0 pg   MCHC 32.9 30.0 - 36.0 g/dL   RDW 86.2 88.4 - 84.4 %   Platelets 313 150 - 400 K/uL   nRBC 0.0 0.0 - 0.2 %   Neutrophils Relative % 56 %   Neutro Abs 5.0 1.7 - 7.7 K/uL   Lymphocytes Relative 32 %   Lymphs Abs 2.8 0.7 - 4.0 K/uL   Monocytes Relative 9 %   Monocytes Absolute 0.8 0.1 - 1.0 K/uL   Eosinophils Relative 1 %   Eosinophils Absolute 0.1 0.0 - 0.5 K/uL   Basophils Relative 1 %   Basophils Absolute 0.1 0.0 - 0.1 K/uL   Immature Granulocytes 1 %   Abs Immature Granulocytes 0.04 0.00 - 0.07 K/uL    Comment: Performed at [facility], [address], [place], [place] [address]  CBG monitoring, ED     Status: Abnormal   Collection Time: 03/03/24  9:06 AM  Result Value Ref Range   Glucose-Capillary 113 (H) 70 - 99 mg/dL    Comment: Glucose reference range applies only to samples taken after fasting for at least 8 hours.  Ammonia     Status: None   Collection Time: 03/07/24  3:35 PM  Result Value Ref Range   Ammonia <13 9 - 35 umol/L    Comment: Performed at [facility], [address], [place], [place] [address]  CBC with Differential     Status: Abnormal   Collection Time: 03/07/24  3:35 PM  Result Value Ref Range   WBC 7.9 4.0 - 10.5 K/uL   RBC 6.22 (H) 4.22 - 5.81 MIL/uL   Hemoglobin 17.7 (H) 13.0 - 17.0 g/dL   HCT 46.5 (H) 60.9 - 47.9 %   MCV 85.9 80.0 - 100.0 fL   MCH 28.5 26.0 - 34.0 pg   MCHC 33.1 30.0 - 36.0 g/dL   RDW 86.2 88.4 - 84.4 %  Platelets 316 150 - 400 K/uL   nRBC 0.0 0.0 - 0.2 %   Neutrophils Relative % 53 %   Neutro Abs 4.2 1.7 - 7.7 K/uL   Lymphocytes Relative 34 %   Lymphs Abs 2.7 0.7 - 4.0 K/uL   Monocytes Relative 10 %   Monocytes Absolute 0.8 0.1 - 1.0 K/uL   Eosinophils Relative 2 %   Eosinophils Absolute 0.1 0.0 - 0.5 K/uL   Basophils Relative 1 %   Basophils Absolute 0.1 0.0 - 0.1 K/uL   Immature  Granulocytes 0 %   Abs Immature Granulocytes 0.02 0.00 - 0.07 K/uL    Comment: Performed at [facility], [address], [place], [place] [address]  Comprehensive metabolic panel     Status: Abnormal   Collection Time: 03/07/24  3:35 PM  Result Value Ref Range   Sodium 137 135 - 145 mmol/L   Potassium 4.5 3.5 - 5.1 mmol/L   Chloride 97 (L) 98 - 111 mmol/L   CO2 28 22 - 32 mmol/L   Glucose, Bld 99 70 - 99 mg/dL    Comment: Glucose reference range applies only to samples taken after fasting for at least 8 hours.   BUN 21 (H) 6 - 20 mg/dL   Creatinine, Ser 8.87 0.61 - 1.24 mg/dL   Calcium  10.2 8.9 - 10.3 mg/dL   Total Protein 8.2 (H) 6.5 - 8.1 g/dL   Albumin  4.6 3.5 - 5.0 g/dL   AST 29 15 - 41 U/L   ALT 23 0 - 44 U/L   Alkaline Phosphatase 100 38 - 126 U/L   Total Bilirubin 0.8 0.0 - 1.2 mg/dL   GFR, Estimated >39 >39 mL/min    Comment: (NOTE) Calculated using the CKD-EPI Creatinine Equation (2021)    Anion gap 12 5 - 15    Comment: Performed at [facility], [address], [place], [place] [address]  Urinalysis, w/ Reflex to Culture (Infection Suspected) -Urine, Clean Catch     Status: Abnormal   Collection Time: 03/07/24  8:22 PM  Result Value Ref Range   Specimen Source URINE, CLEAN CATCH    Color, Urine YELLOW YELLOW   APPearance CLEAR CLEAR   Specific Gravity, Urine 1.024 1.005 - 1.030   pH 6.0 5.0 - 8.0   Glucose, UA NEGATIVE NEGATIVE mg/dL   Hgb urine dipstick NEGATIVE NEGATIVE   Bilirubin Urine NEGATIVE NEGATIVE   Ketones, ur 5 (A) NEGATIVE mg/dL   Protein, ur NEGATIVE NEGATIVE mg/dL   Nitrite NEGATIVE NEGATIVE   Leukocytes,Ua TRACE (A) NEGATIVE   RBC / HPF 0-5 0 - 5 RBC/hpf   WBC, UA 0-5 0 - 5 WBC/hpf    Comment:        Reflex urine culture not performed if WBC <=10, OR if Squamous epithelial cells >5. If Squamous epithelial cells >5 suggest recollection.    Bacteria, UA RARE (A) NONE SEEN   Squamous Epithelial / HPF 0-5  0 - 5 /HPF   Mucus PRESENT     Comment: Performed at [facility], [address] [place], [place] [address]  HIV Antibody (routine testing w rflx)     Status: None   Collection Time: 03/08/24  5:08 AM  Result Value Ref Range   HIV Screen 4th Generation wRfx Non Reactive Non Reactive    Comment: Performed at [facility], [address] [place], [place] [address]  Basic metabolic panel     Status: Abnormal  Collection Time: 03/08/24  5:08 AM  Result Value Ref Range   Sodium 136 135 - 145 mmol/L   Potassium 4.6 3.5 - 5.1 mmol/L   Chloride 98 98 - 111 mmol/L   CO2 22 22 - 32 mmol/L   Glucose, Bld 60 (L) 70 - 99 mg/dL    Comment: Glucose reference range applies only to samples taken after fasting for at least 8 hours.   BUN 22 (H) 6 - 20 mg/dL   Creatinine, Ser 8.86 0.61 - 1.24 mg/dL   Calcium  10.3 8.9 - 10.3 mg/dL   GFR, Estimated >39 >39 mL/min    Comment: (NOTE) Calculated using the CKD-EPI Creatinine Equation (2021)    Anion gap 17 (H) 5 - 15    Comment: Performed at [facility], [address] [place], [place] [address]  CBC     Status: Abnormal   Collection Time: 03/08/24  5:08 AM  Result Value Ref Range   WBC 8.5 4.0 - 10.5 K/uL   RBC 6.33 (H) 4.22 - 5.81 MIL/uL   Hemoglobin 17.7 (H) 13.0 - 17.0 g/dL   HCT 44.8 (H) 60.9 - 47.9 %   MCV 87.0 80.0 - 100.0 fL   MCH 28.0 26.0 - 34.0 pg   MCHC 32.1 30.0 - 36.0 g/dL   RDW 85.9 88.4 - 84.4 %   Platelets 333 150 - 400 K/uL   nRBC 0.0 0.0 - 0.2 %    Comment: Performed at [facility], [address], [place], [place] [address]  Vitamin B12     Status: None   Collection Time: 03/08/24  5:08 AM  Result Value Ref Range   Vitamin B-12 573 180 - 914 pg/mL    Comment: Performed at [facility], [address], [place], [place] [address]  TSH     Status: None   Collection Time: 03/08/24  5:08 AM  Result Value Ref Range   TSH 2.640 0.350 - 4.500  uIU/mL    Comment: Performed at [facility], [address] [place], [place] [address]  Vitamin B1     Status: None   Collection Time: 03/08/24  5:08 AM  Result Value Ref Range   Vitamin B1 (Thiamine ) 151.8 66.5 - 200.0 nmol/L    Comment: (NOTE) This test was developed and its performance characteristics determined by [facility]. It has not been cleared or approved by the Food and Drug Administration. Performed At: [facility] [address] [place], [place] [id number] Jennette Shorter MD Ey:[id number]   Glucose, capillary     Status: None   Collection Time: 03/08/24  7:30 AM  Result Value Ref Range   Glucose-Capillary 95 70 - 99 mg/dL    Comment: Glucose reference range applies only to samples taken after fasting for at least 8 hours.  Blood gas, arterial     Status: Abnormal   Collection Time: 03/08/24 10:15 AM  Result Value Ref Range   FIO2 21 %   pH, Arterial 7.49 (H) 7.35 - 7.45   pCO2 arterial 34 32 - 48 mmHg   pO2, Arterial 65 (L) 83 - 108 mmHg   Bicarbonate 26.1 20.0 - 28.0 mmol/L   Acid-Base Excess 2.8 (H) 0.0 - 2.0 mmol/L   O2 Saturation 95.5 %   Patient temperature 36.7    Collection site RIGHT RADIAL    Drawn by [id number]    Allens test (pass/fail) PASS PASS    Comment: Performed at [facility], [address] [place], [place] [address]  Glucose, capillary     Status: Abnormal   Collection Time: 03/08/24 11:48 AM  Result Value Ref Range   Glucose-Capillary 179 (H) 70 - 99 mg/dL    Comment: Glucose reference range applies only to samples taken after fasting for at least 8 hours.  Valproic acid  level     Status: None   Collection Time: 03/08/24  3:41 PM  Result Value Ref Range   Valproic Acid  Lvl 73 50 - 100 ug/mL    Comment: Performed at [facility], [address] [place], [place] [address]  Glucose, capillary     Status: Abnormal   Collection Time: 03/08/24  4:33 PM  Result Value Ref Range    Glucose-Capillary 104 (H) 70 - 99 mg/dL    Comment: Glucose reference range applies only to samples taken after fasting for at least 8 hours.  Glucose, capillary     Status: None   Collection Time: 03/08/24  8:20 PM  Result Value Ref Range   Glucose-Capillary 93 70 - 99 mg/dL    Comment: Glucose reference range applies only to samples taken after fasting for at least 8 hours.  Glucose, capillary     Status: None   Collection Time: 03/09/24  7:29 AM  Result Value Ref Range   Glucose-Capillary 84 70 - 99 mg/dL    Comment: Glucose reference range applies only to samples taken after fasting for at least 8 hours.  CBC     Status: None   Collection Time: 03/09/24  8:21 AM  Result Value Ref Range   WBC 6.4 4.0 - 10.5 K/uL   RBC 5.57 4.22 - 5.81 MIL/uL   Hemoglobin 15.9 13.0 - 17.0 g/dL   HCT 52.0 60.9 - 47.9 %   MCV 86.0 80.0 - 100.0 fL   MCH 28.5 26.0 - 34.0 pg   MCHC 33.2 30.0 - 36.0 g/dL   RDW 86.4 88.4 - 84.4 %   Platelets 260 150 - 400 K/uL   nRBC 0.0 0.0 - 0.2 %    Comment: Performed at [facility], [address], [place], [place] [address]  Basic metabolic panel     Status: None   Collection Time: 03/09/24  8:21 AM  Result Value Ref Range   Sodium 138 135 - 145 mmol/L   Potassium 4.2 3.5 - 5.1 mmol/L   Chloride 101 98 - 111 mmol/L   CO2 25 22 - 32 mmol/L   Glucose, Bld 82 70 - 99 mg/dL    Comment: Glucose reference range applies only to samples taken after fasting for at least 8 hours.   BUN 19 6 - 20 mg/dL   Creatinine, Ser 9.07 0.61 - 1.24 mg/dL   Calcium  9.6 8.9 - 10.3 mg/dL   GFR, Estimated >39 >39 mL/min    Comment: (NOTE) Calculated using the CKD-EPI Creatinine Equation (2021)    Anion gap 12 5 - 15    Comment: Performed at [facility], [address], [place], [place] [address]  Glucose, capillary     Status: Abnormal   Collection Time: 03/09/24 11:32 AM  Result Value Ref Range   Glucose-Capillary 118 (H) 70 - 99 mg/dL     Comment: Glucose reference range applies only to samples taken after fasting for at least 8 hours.  Glucose, capillary     Status: Abnormal   Collection Time: 03/09/24  4:17 PM  Result Value Ref Range  Glucose-Capillary 121 (H) 70 - 99 mg/dL    Comment: Glucose reference range applies only to samples taken after fasting for at least 8 hours.  Glucose, capillary     Status: Abnormal   Collection Time: 03/09/24  9:13 PM  Result Value Ref Range   Glucose-Capillary 102 (H) 70 - 99 mg/dL    Comment: Glucose reference range applies only to samples taken after fasting for at least 8 hours.  Glucose, capillary     Status: Abnormal   Collection Time: 03/10/24  9:29 AM  Result Value Ref Range   Glucose-Capillary 171 (H) 70 - 99 mg/dL    Comment: Glucose reference range applies only to samples taken after fasting for at least 8 hours.  Glucose, capillary     Status: Abnormal   Collection Time: 03/10/24 12:57 PM  Result Value Ref Range   Glucose-Capillary 133 (H) 70 - 99 mg/dL    Comment: Glucose reference range applies only to samples taken after fasting for at least 8 hours.  Glucose, capillary     Status: None   Collection Time: 03/10/24  5:11 PM  Result Value Ref Range   Glucose-Capillary 99 70 - 99 mg/dL    Comment: Glucose reference range applies only to samples taken after fasting for at least 8 hours.  Glucose, capillary     Status: Abnormal   Collection Time: 03/10/24 10:01 PM  Result Value Ref Range   Glucose-Capillary 110 (H) 70 - 99 mg/dL    Comment: Glucose reference range applies only to samples taken after fasting for at least 8 hours.  Glucose, capillary     Status: None   Collection Time: 03/11/24  7:23 AM  Result Value Ref Range   Glucose-Capillary 87 70 - 99 mg/dL    Comment: Glucose reference range applies only to samples taken after fasting for at least 8 hours.  Glucose, capillary     Status: Abnormal   Collection Time: 03/11/24 12:05 PM  Result Value Ref Range    Glucose-Capillary 186 (H) 70 - 99 mg/dL    Comment: Glucose reference range applies only to samples taken after fasting for at least 8 hours.  Glucose, capillary     Status: None   Collection Time: 03/11/24  4:37 PM  Result Value Ref Range   Glucose-Capillary 87 70 - 99 mg/dL    Comment: Glucose reference range applies only to samples taken after fasting for at least 8 hours.  Glucose, capillary     Status: None   Collection Time: 03/11/24  9:11 PM  Result Value Ref Range   Glucose-Capillary 95 70 - 99 mg/dL    Comment: Glucose reference range applies only to samples taken after fasting for at least 8 hours.  CBC     Status: None   Collection Time: 03/12/24  4:29 AM  Result Value Ref Range   WBC 5.9 4.0 - 10.5 K/uL   RBC 5.50 4.22 - 5.81 MIL/uL   Hemoglobin 15.2 13.0 - 17.0 g/dL   HCT 52.3 60.9 - 47.9 %   MCV 86.5 80.0 - 100.0 fL   MCH 27.6 26.0 - 34.0 pg   MCHC 31.9 30.0 - 36.0 g/dL   RDW 86.3 88.4 - 84.4 %   Platelets 222 150 - 400 K/uL   nRBC 0.0 0.0 - 0.2 %    Comment: Performed at [facility], [address] [place], [place] [address]  Basic metabolic panel with GFR     Status: None  Collection Time: 03/12/24  4:29 AM  Result Value Ref Range   Sodium 139 135 - 145 mmol/L   Potassium 3.9 3.5 - 5.1 mmol/L   Chloride 101 98 - 111 mmol/L   CO2 28 22 - 32 mmol/L   Glucose, Bld 78 70 - 99 mg/dL    Comment: Glucose reference range applies only to samples taken after fasting for at least 8 hours.   BUN 14 6 - 20 mg/dL   Creatinine, Ser 8.97 0.61 - 1.24 mg/dL   Calcium  9.7 8.9 - 10.3 mg/dL   GFR, Estimated >39 >39 mL/min    Comment: (NOTE) Calculated using the CKD-EPI Creatinine Equation (2021)    Anion gap 10 5 - 15    Comment: Performed at [facility], [address] [place], [place] [address]  Glucose, capillary     Status: None   Collection Time: 03/12/24  7:38 AM  Result Value Ref Range   Glucose-Capillary 79 70 - 99 mg/dL     Comment: Glucose reference range applies only to samples taken after fasting for at least 8 hours.  Glucose, capillary     Status: None   Collection Time: 03/12/24 11:35 AM  Result Value Ref Range   Glucose-Capillary 95 70 - 99 mg/dL    Comment: Glucose reference range applies only to samples taken after fasting for at least 8 hours.  Glucose, capillary     Status: None   Collection Time: 03/12/24  4:21 PM  Result Value Ref Range   Glucose-Capillary 96 70 - 99 mg/dL    Comment: Glucose reference range applies only to samples taken after fasting for at least 8 hours.  Glucose, capillary     Status: None   Collection Time: 03/12/24  8:47 PM  Result Value Ref Range   Glucose-Capillary 97 70 - 99 mg/dL    Comment: Glucose reference range applies only to samples taken after fasting for at least 8 hours.   Comment 1 Notify RN    Comment 2 Document in Chart   Glucose, capillary     Status: None   Collection Time: 03/13/24 10:38 AM  Result Value Ref Range   Glucose-Capillary 91 70 - 99 mg/dL    Comment: Glucose reference range applies only to samples taken after fasting for at least 8 hours.  MRSA Next Gen by PCR, Nasal     Status: Abnormal   Collection Time: 03/13/24  2:17 PM   Specimen: Nasal Mucosa; Nasal Swab  Result Value Ref Range   MRSA by PCR Next Gen DETECTED (A) NOT DETECTED    Comment: (NOTE) The GeneXpert MRSA Assay (FDA approved for NASAL specimens only), is one component of a comprehensive MRSA colonization surveillance program. It is not intended to diagnose MRSA infection nor to guide or monitor treatment for MRSA infections. Test performance is not FDA approved in patients less than 65 years old. Performed at [facility], [address] [place], [place] [address]   CBC     Status: None   Collection Time: 03/13/24  2:42 PM  Result Value Ref Range   WBC 6.1 4.0 - 10.5 K/uL   RBC 5.08 4.22 - 5.81 MIL/uL   Hemoglobin 14.1 13.0 - 17.0 g/dL   HCT  55.4 60.9 - 47.9 %   MCV 87.6 80.0 - 100.0 fL   MCH 27.8 26.0 - 34.0 pg   MCHC 31.7 30.0 - 36.0 g/dL   RDW 86.3 88.4 - 84.4 %  Platelets 213 150 - 400 K/uL   nRBC 0.0 0.0 - 0.2 %    Comment: Performed at [facility], [address], [place], [place] [address]  Basic metabolic panel     Status: None   Collection Time: 03/13/24  2:42 PM  Result Value Ref Range   Sodium 137 135 - 145 mmol/L   Potassium 4.1 3.5 - 5.1 mmol/L   Chloride 103 98 - 111 mmol/L   CO2 26 22 - 32 mmol/L   Glucose, Bld 85 70 - 99 mg/dL    Comment: Glucose reference range applies only to samples taken after fasting for at least 8 hours.   BUN 15 6 - 20 mg/dL   Creatinine, Ser 9.12 0.61 - 1.24 mg/dL   Calcium  9.1 8.9 - 10.3 mg/dL   GFR, Estimated >39 >39 mL/min    Comment: (NOTE) Calculated using the CKD-EPI Creatinine Equation (2021)    Anion gap 9 5 - 15    Comment: Performed at [facility], [address] [place], [place] [address]  Glucose, capillary     Status: None   Collection Time: 03/13/24  3:59 PM  Result Value Ref Range   Glucose-Capillary 77 70 - 99 mg/dL    Comment: Glucose reference range applies only to samples taken after fasting for at least 8 hours.   Comment 1 Notify RN    Comment 2 Document in Chart   Glucose, capillary     Status: Abnormal   Collection Time: 03/13/24  9:32 PM  Result Value Ref Range   Glucose-Capillary 64 (L) 70 - 99 mg/dL    Comment: Glucose reference range applies only to samples taken after fasting for at least 8 hours.   Comment 1 Notify RN    Comment 2 Document in Chart   Glucose, capillary     Status: None   Collection Time: 03/13/24  9:35 PM  Result Value Ref Range   Glucose-Capillary 71 70 - 99 mg/dL    Comment: Glucose reference range applies only to samples taken after fasting for at least 8 hours.   Comment 1 Notify RN    Comment 2 Document in Chart   Glucose, capillary     Status: None   Collection Time:  03/13/24 10:45 PM  Result Value Ref Range   Glucose-Capillary 94 70 - 99 mg/dL    Comment: Glucose reference range applies only to samples taken after fasting for at least 8 hours.   Comment 1 Notify RN    Comment 2 Document in Chart   Glucose, capillary     Status: None   Collection Time: 03/14/24  3:04 AM  Result Value Ref Range   Glucose-Capillary 87 70 - 99 mg/dL    Comment: Glucose reference range applies only to samples taken after fasting for at least 8 hours.  CBC     Status: None   Collection Time: 03/14/24  3:05 AM  Result Value Ref Range   WBC 4.8 4.0 - 10.5 K/uL   RBC 5.14 4.22 - 5.81 MIL/uL   Hemoglobin 14.6 13.0 - 17.0 g/dL   HCT 54.2 60.9 - 47.9 %   MCV 88.9 80.0 - 100.0 fL   MCH 28.4 26.0 - 34.0 pg   MCHC 31.9 30.0 - 36.0 g/dL   RDW 86.3 88.4 - 84.4 %   Platelets 182 150 - 400 K/uL   nRBC 0.0 0.0 - 0.2 %    Comment: Performed at [facility], [address], [place], [place] [address]  Basic metabolic panel with GFR     Status: None   Collection Time: 03/14/24  3:05 AM  Result Value Ref Range   Sodium 138 135 - 145 mmol/L   Potassium 3.9 3.5 - 5.1 mmol/L   Chloride 102 98 - 111 mmol/L   CO2 28 22 - 32 mmol/L   Glucose, Bld 83 70 - 99 mg/dL    Comment: Glucose reference range applies only to samples taken after fasting for at least 8 hours.   BUN 12 6 - 20 mg/dL   Creatinine, Ser 9.19 0.61 - 1.24 mg/dL   Calcium  9.3 8.9 - 10.3 mg/dL   GFR, Estimated >39 >39 mL/min    Comment: (NOTE) Calculated using the CKD-EPI Creatinine Equation (2021)    Anion gap 8 5 - 15    Comment: Performed at [facility], [address], [place], [place] [address]  Glucose, capillary     Status: Abnormal   Collection Time: 03/14/24  5:02 PM  Result Value Ref Range   Glucose-Capillary 108 (H) 70 - 99 mg/dL    Comment: Glucose reference range applies only to samples taken after fasting for at least 8 hours.  Glucose, capillary     Status:  Abnormal   Collection Time: 03/14/24 10:03 PM  Result Value Ref Range   Glucose-Capillary 154 (H) 70 - 99 mg/dL    Comment: Glucose reference range applies only to samples taken after fasting for at least 8 hours.  Glucose, capillary     Status: Abnormal   Collection Time: 03/15/24  8:00 AM  Result Value Ref Range   Glucose-Capillary 113 (H) 70 - 99 mg/dL    Comment: Glucose reference range applies only to samples taken after fasting for at least 8 hours.  Glucose, capillary     Status: None   Collection Time: 03/15/24 12:06 PM  Result Value Ref Range   Glucose-Capillary 89 70 - 99 mg/dL    Comment: Glucose reference range applies only to samples taken after fasting for at least 8 hours.  Glucose, capillary     Status: Abnormal   Collection Time: 03/15/24  5:06 PM  Result Value Ref Range   Glucose-Capillary 149 (H) 70 - 99 mg/dL    Comment: Glucose reference range applies only to samples taken after fasting for at least 8 hours.  Glucose, capillary     Status: Abnormal   Collection Time: 03/15/24  9:07 PM  Result Value Ref Range   Glucose-Capillary 119 (H) 70 - 99 mg/dL    Comment: Glucose reference range applies only to samples taken after fasting for at least 8 hours.   Comment 1 Notify RN   Glucose, capillary     Status: Abnormal   Collection Time: 03/16/24  7:24 AM  Result Value Ref Range   Glucose-Capillary 101 (H) 70 - 99 mg/dL    Comment: Glucose reference range applies only to samples taken after fasting for at least 8 hours.  Glucose, capillary     Status: Abnormal   Collection Time: 03/16/24 11:24 AM  Result Value Ref Range   Glucose-Capillary 144 (H) 70 - 99 mg/dL    Comment: Glucose reference range applies only to samples taken after fasting for at least 8 hours.  MRSA Next Gen by PCR, Nasal     Status: Abnormal   Collection Time: 03/16/24  2:20 PM   Specimen: Nasal Mucosa; Nasal Swab  Result Value Ref Range  MRSA by PCR Next Gen DETECTED (A) NOT DETECTED     Comment: (NOTE) The GeneXpert MRSA Assay (FDA approved for NASAL specimens only), is one component of a comprehensive MRSA colonization surveillance program. It is not intended to diagnose MRSA infection nor to guide or monitor treatment for MRSA infections. Test performance is not FDA approved in patients less than 62 years old. Performed at [facility], [address] [place], [place] [address]   Glucose, capillary     Status: Abnormal   Collection Time: 03/16/24  5:01 PM  Result Value Ref Range   Glucose-Capillary 127 (H) 70 - 99 mg/dL    Comment: Glucose reference range applies only to samples taken after fasting for at least 8 hours.  Glucose, capillary     Status: Abnormal   Collection Time: 03/16/24  9:25 PM  Result Value Ref Range   Glucose-Capillary 109 (H) 70 - 99 mg/dL    Comment: Glucose reference range applies only to samples taken after fasting for at least 8 hours.   Comment 1 Notify RN    Comment 2 Document in Chart   Glucose, capillary     Status: Abnormal   Collection Time: 03/17/24  7:44 AM  Result Value Ref Range   Glucose-Capillary 117 (H) 70 - 99 mg/dL    Comment: Glucose reference range applies only to samples taken after fasting for at least 8 hours.  Glucose, capillary     Status: Abnormal   Collection Time: 03/17/24 11:53 AM  Result Value Ref Range   Glucose-Capillary 143 (H) 70 - 99 mg/dL    Comment: Glucose reference range applies only to samples taken after fasting for at least 8 hours.  Glucose, capillary     Status: Abnormal   Collection Time: 03/17/24  4:56 PM  Result Value Ref Range   Glucose-Capillary 119 (H) 70 - 99 mg/dL    Comment: Glucose reference range applies only to samples taken after fasting for at least 8 hours.  Glucose, capillary     Status: Abnormal   Collection Time: 03/17/24  9:21 PM  Result Value Ref Range   Glucose-Capillary 112 (H) 70 - 99 mg/dL    Comment: Glucose reference range applies only to samples  taken after fasting for at least 8 hours.   Comment 1 Notify RN   Glucose, capillary     Status: Abnormal   Collection Time: 03/18/24  7:31 AM  Result Value Ref Range   Glucose-Capillary 120 (H) 70 - 99 mg/dL    Comment: Glucose reference range applies only to samples taken after fasting for at least 8 hours.   Comment 1 Notify RN   Glucose, capillary     Status: Abnormal   Collection Time: 03/18/24 11:26 AM  Result Value Ref Range   Glucose-Capillary 174 (H) 70 - 99 mg/dL    Comment: Glucose reference range applies only to samples taken after fasting for at least 8 hours.   Comment 1 Notify RN   Glucose, capillary     Status: Abnormal   Collection Time: 03/18/24  5:18 PM  Result Value Ref Range   Glucose-Capillary 130 (H) 70 - 99 mg/dL    Comment: Glucose reference range applies only to samples taken after fasting for at least 8 hours.   Comment 1 Notify RN   Glucose, capillary     Status: Abnormal   Collection Time: 03/18/24  8:57 PM  Result Value Ref Range   Glucose-Capillary 148 (H) 70 -  99 mg/dL    Comment: Glucose reference range applies only to samples taken after fasting for at least 8 hours.   Comment 1 Notify RN   Glucose, capillary     Status: Abnormal   Collection Time: 03/19/24  8:02 AM  Result Value Ref Range   Glucose-Capillary 123 (H) 70 - 99 mg/dL    Comment: Glucose reference range applies only to samples taken after fasting for at least 8 hours.  Glucose, capillary     Status: Abnormal   Collection Time: 03/19/24 11:57 AM  Result Value Ref Range   Glucose-Capillary 128 (H) 70 - 99 mg/dL    Comment: Glucose reference range applies only to samples taken after fasting for at least 8 hours.  Glucose, capillary     Status: Abnormal   Collection Time: 03/19/24  4:51 PM  Result Value Ref Range   Glucose-Capillary 116 (H) 70 - 99 mg/dL    Comment: Glucose reference range applies only to samples taken after fasting for at least 8 hours.  Glucose, capillary      Status: Abnormal   Collection Time: 03/19/24  9:05 PM  Result Value Ref Range   Glucose-Capillary 139 (H) 70 - 99 mg/dL    Comment: Glucose reference range applies only to samples taken after fasting for at least 8 hours.  Glucose, capillary     Status: Abnormal   Collection Time: 03/19/24 10:11 PM  Result Value Ref Range   Glucose-Capillary 136 (H) 70 - 99 mg/dL    Comment: Glucose reference range applies only to samples taken after fasting for at least 8 hours.  Glucose, capillary     Status: Abnormal   Collection Time: 03/20/24  7:44 AM  Result Value Ref Range   Glucose-Capillary 132 (H) 70 - 99 mg/dL    Comment: Glucose reference range applies only to samples taken after fasting for at least 8 hours.  Glucose, capillary     Status: Abnormal   Collection Time: 03/20/24 11:39 AM  Result Value Ref Range   Glucose-Capillary 148 (H) 70 - 99 mg/dL    Comment: Glucose reference range applies only to samples taken after fasting for at least 8 hours.  Glucose, capillary     Status: Abnormal   Collection Time: 03/20/24  4:46 PM  Result Value Ref Range   Glucose-Capillary 154 (H) 70 - 99 mg/dL    Comment: Glucose reference range applies only to samples taken after fasting for at least 8 hours.  Glucose, capillary     Status: Abnormal   Collection Time: 03/20/24  7:52 PM  Result Value Ref Range   Glucose-Capillary 159 (H) 70 - 99 mg/dL    Comment: Glucose reference range applies only to samples taken after fasting for at least 8 hours.  Glucose, capillary     Status: Abnormal   Collection Time: 03/21/24  7:36 AM  Result Value Ref Range   Glucose-Capillary 127 (H) 70 - 99 mg/dL    Comment: Glucose reference range applies only to samples taken after fasting for at least 8 hours.  Glucose, capillary     Status: Abnormal   Collection Time: 03/21/24 11:42 AM  Result Value Ref Range   Glucose-Capillary 170 (H) 70 - 99 mg/dL    Comment: Glucose reference range applies only to samples taken  after fasting for at least 8 hours.  Glucose, capillary     Status: Abnormal   Collection Time: 03/21/24  4:46 PM  Result Value Ref Range  Glucose-Capillary 181 (H) 70 - 99 mg/dL    Comment: Glucose reference range applies only to samples taken after fasting for at least 8 hours.  Glucose, capillary     Status: Abnormal   Collection Time: 03/21/24  9:08 PM  Result Value Ref Range   Glucose-Capillary 136 (H) 70 - 99 mg/dL    Comment: Glucose reference range applies only to samples taken after fasting for at least 8 hours.  Comprehensive metabolic panel with GFR     Status: Abnormal   Collection Time: 03/22/24  4:36 AM  Result Value Ref Range   Sodium 137 135 - 145 mmol/L   Potassium 3.6 3.5 - 5.1 mmol/L   Chloride 101 98 - 111 mmol/L   CO2 25 22 - 32 mmol/L   Glucose, Bld 112 (H) 70 - 99 mg/dL    Comment: Glucose reference range applies only to samples taken after fasting for at least 8 hours.   BUN 15 6 - 20 mg/dL   Creatinine, Ser 9.15 0.61 - 1.24 mg/dL   Calcium  9.4 8.9 - 10.3 mg/dL   Total Protein 6.6 6.5 - 8.1 g/dL   Albumin  3.8 3.5 - 5.0 g/dL   AST 24 15 - 41 U/L   ALT 34 0 - 44 U/L   Alkaline Phosphatase 69 38 - 126 U/L   Total Bilirubin 0.6 0.0 - 1.2 mg/dL   GFR, Estimated >39 >39 mL/min    Comment: (NOTE) Calculated using the CKD-EPI Creatinine Equation (2021)    Anion gap 11 5 - 15    Comment: Performed at [facility], [address] [place], [place] [address]  CBC     Status: None   Collection Time: 03/22/24  4:36 AM  Result Value Ref Range   WBC 6.5 4.0 - 10.5 K/uL   RBC 5.71 4.22 - 5.81 MIL/uL   Hemoglobin 16.4 13.0 - 17.0 g/dL   HCT 51.4 60.9 - 47.9 %   MCV 84.9 80.0 - 100.0 fL   MCH 28.7 26.0 - 34.0 pg   MCHC 33.8 30.0 - 36.0 g/dL   RDW 85.7 88.4 - 84.4 %   Platelets 203 150 - 400 K/uL   nRBC 0.0 0.0 - 0.2 %    Comment: Performed at [facility], [address] [place], [place] [address]  Glucose, capillary      Status: Abnormal   Collection Time: 03/22/24  7:57 AM  Result Value Ref Range   Glucose-Capillary 138 (H) 70 - 99 mg/dL    Comment: Glucose reference range applies only to samples taken after fasting for at least 8 hours.  Glucose, capillary     Status: Abnormal   Collection Time: 03/22/24 11:51 AM  Result Value Ref Range   Glucose-Capillary 138 (H) 70 - 99 mg/dL    Comment: Glucose reference range applies only to samples taken after fasting for at least 8 hours.  Glucose, capillary     Status: Abnormal   Collection Time: 03/22/24  4:49 PM  Result Value Ref Range   Glucose-Capillary 147 (H) 70 - 99 mg/dL    Comment: Glucose reference range applies only to samples taken after fasting for at least 8 hours.  Glucose, capillary     Status: Abnormal   Collection Time: 03/22/24  9:17 PM  Result Value Ref Range   Glucose-Capillary 145 (H) 70 - 99 mg/dL    Comment: Glucose reference range applies only to samples taken after fasting for at least 8  hours.   Comment 1 Notify RN    Comment 2 Document in Chart   Glucose, capillary     Status: Abnormal   Collection Time: 03/23/24  7:43 AM  Result Value Ref Range   Glucose-Capillary 123 (H) 70 - 99 mg/dL    Comment: Glucose reference range applies only to samples taken after fasting for at least 8 hours.  Glucose, capillary     Status: Abnormal   Collection Time: 03/23/24 11:35 AM  Result Value Ref Range   Glucose-Capillary 166 (H) 70 - 99 mg/dL    Comment: Glucose reference range applies only to samples taken after fasting for at least 8 hours.  Glucose, capillary     Status: Abnormal   Collection Time: 03/23/24  5:14 PM  Result Value Ref Range   Glucose-Capillary 144 (H) 70 - 99 mg/dL    Comment: Glucose reference range applies only to samples taken after fasting for at least 8 hours.  Glucose, capillary     Status: Abnormal   Collection Time: 03/23/24  8:55 PM  Result Value Ref Range   Glucose-Capillary 154 (H) 70 - 99 mg/dL    Comment:  Glucose reference range applies only to samples taken after fasting for at least 8 hours.   Comment 1 Notify RN    Comment 2 Document in Chart   Glucose, capillary     Status: Abnormal   Collection Time: 03/24/24  7:19 AM  Result Value Ref Range   Glucose-Capillary 117 (H) 70 - 99 mg/dL    Comment: Glucose reference range applies only to samples taken after fasting for at least 8 hours.  Glucose, capillary     Status: Abnormal   Collection Time: 03/24/24 10:55 AM  Result Value Ref Range   Glucose-Capillary 153 (H) 70 - 99 mg/dL    Comment: Glucose reference range applies only to samples taken after fasting for at least 8 hours.  Glucose, capillary     Status: Abnormal   Collection Time: 03/24/24  4:54 PM  Result Value Ref Range   Glucose-Capillary 117 (H) 70 - 99 mg/dL    Comment: Glucose reference range applies only to samples taken after fasting for at least 8 hours.   Comment 1 Notify RN   Glucose, capillary     Status: Abnormal   Collection Time: 03/24/24  9:19 PM  Result Value Ref Range   Glucose-Capillary 147 (H) 70 - 99 mg/dL    Comment: Glucose reference range applies only to samples taken after fasting for at least 8 hours.   Comment 1 Notify RN   Glucose, capillary     Status: Abnormal   Collection Time: 03/25/24  7:15 AM  Result Value Ref Range   Glucose-Capillary 120 (H) 70 - 99 mg/dL    Comment: Glucose reference range applies only to samples taken after fasting for at least 8 hours.  Glucose, capillary     Status: Abnormal   Collection Time: 03/25/24 12:11 PM  Result Value Ref Range   Glucose-Capillary 142 (H) 70 - 99 mg/dL    Comment: Glucose reference range applies only to samples taken after fasting for at least 8 hours.  Comprehensive metabolic panel with GFR     Status: Abnormal   Collection Time: 03/26/24  4:36 AM  Result Value Ref Range   Sodium 137 135 - 145 mmol/L   Potassium 3.8 3.5 - 5.1 mmol/L   Chloride 100 98 - 111 mmol/L   CO2 26 22 - 32 mmol/L  Glucose, Bld 104 (H) 70 - 99 mg/dL    Comment: Glucose reference range applies only to samples taken after fasting for at least 8 hours.   BUN 13 6 - 20 mg/dL   Creatinine, Ser 9.16 0.61 - 1.24 mg/dL   Calcium  9.7 8.9 - 10.3 mg/dL   Total Protein 7.2 6.5 - 8.1 g/dL   Albumin  4.0 3.5 - 5.0 g/dL   AST 24 15 - 41 U/L   ALT 29 0 - 44 U/L   Alkaline Phosphatase 78 38 - 126 U/L   Total Bilirubin 0.8 0.0 - 1.2 mg/dL   GFR, Estimated >39 >39 mL/min    Comment: (NOTE) Calculated using the CKD-EPI Creatinine Equation (2021)    Anion gap 12 5 - 15    Comment: Performed at [facility], [address] [place], [place] [address]  CBC     Status: Abnormal   Collection Time: 03/26/24  4:36 AM  Result Value Ref Range   WBC 7.9 4.0 - 10.5 K/uL   RBC 6.00 (H) 4.22 - 5.81 MIL/uL   Hemoglobin 17.3 (H) 13.0 - 17.0 g/dL   HCT 48.2 60.9 - 47.9 %   MCV 86.2 80.0 - 100.0 fL   MCH 28.8 26.0 - 34.0 pg   MCHC 33.5 30.0 - 36.0 g/dL   RDW 85.9 88.4 - 84.4 %   Platelets 176 150 - 400 K/uL   nRBC 0.0 0.0 - 0.2 %    Comment: Performed at [facility], [address], [place], [place] [address]  Phosphorus     Status: None   Collection Time: 03/26/24  4:36 AM  Result Value Ref Range   Phosphorus 3.3 2.5 - 4.6 mg/dL    Comment: Performed at [facility], [address], [place], [place] [address]  Magnesium      Status: None   Collection Time: 03/26/24  4:36 AM  Result Value Ref Range   Magnesium  1.9 1.7 - 2.4 mg/dL    Comment: Performed at [facility], [address], [place], [place] [address]  Valproic acid  level     Status: None   Collection Time: 03/28/24  5:25 PM  Result Value Ref Range   Valproic Acid  Lvl 79 50 - 100 ug/mL    Comment: Performed at [facility], [address], [place], [place] [address]  Glucose, capillary     Status: Abnormal   Collection Time: 03/28/24  7:50 PM  Result Value Ref  Range   Glucose-Capillary 138 (H) 70 - 99 mg/dL    Comment: Glucose reference range applies only to samples taken after fasting for at least 8 hours.  Renal function panel     Status: None   Collection Time: 03/31/24  5:04 AM  Result Value Ref Range   Sodium 137 135 - 145 mmol/L   Potassium 3.5 3.5 - 5.1 mmol/L   Chloride 100 98 - 111 mmol/L   CO2 27 22 - 32 mmol/L   Glucose, Bld 86 70 - 99 mg/dL    Comment: Glucose reference range applies only to samples taken after fasting for at least 8 hours.   BUN 13 6 - 20 mg/dL   Creatinine, Ser 9.03 0.61 - 1.24 mg/dL   Calcium  9.4 8.9 - 10.3 mg/dL   Phosphorus 2.8 2.5 - 4.6 mg/dL   Albumin  3.6 3.5 - 5.0 g/dL   GFR, Estimated >39 >39 mL/min    Comment: (NOTE) Calculated using the  CKD-EPI Creatinine Equation (2021)    Anion gap 10 5 - 15    Comment: Performed at [facility], [address] [place], [place] [address]  Magnesium      Status: None   Collection Time: 03/31/24  5:04 AM  Result Value Ref Range   Magnesium  2.0 1.7 - 2.4 mg/dL    Comment: Performed at [facility], [address], [place], [place] [address]  CBC     Status: None   Collection Time: 03/31/24  5:04 AM  Result Value Ref Range   WBC 6.4 4.0 - 10.5 K/uL   RBC 5.53 4.22 - 5.81 MIL/uL   Hemoglobin 15.8 13.0 - 17.0 g/dL   HCT 52.0 60.9 - 47.9 %   MCV 86.6 80.0 - 100.0 fL   MCH 28.6 26.0 - 34.0 pg   MCHC 33.0 30.0 - 36.0 g/dL   RDW 85.8 88.4 - 84.4 %   Platelets 190 150 - 400 K/uL   nRBC 0.0 0.0 - 0.2 %    Comment: Performed at [facility], [address], [place], [place] [address]  CK     Status: None   Collection Time: 03/31/24  5:04 AM  Result Value Ref Range   Total CK 58 49 - 397 U/L    Comment: Performed at [facility], [address] [place], [place] [address]  Valproic acid  level     Status: None   Collection Time: 04/03/24  4:24 AM  Result Value Ref Range   Valproic Acid   Lvl 76 50 - 100 ug/mL    Comment: Performed at [facility], [address] [place], [place] [address]      ## Disposition:-- There are no psychiatric contraindications to discharge at this time  ## Behavioral / Environmental: -Delirium Precautions: Delirium Interventions for Nursing and Staff: - RN to open blinds every AM. - To Bedside: Glasses, hearing aide, and pt's own shoes. Make available to patients. when possible and encourage use. - Encourage po fluids when appropriate, keep fluids within reach. - OOB to chair with meals. - Passive ROM exercises to all extremities with AM & PM care. - RN to assess orientation to person, time and place QAM and PRN. - Recommend extended visitation hours with familiar family/friends as feasible. - Staff to minimize disturbances at night. Turn off television when pt asleep or when not in use.    ## Safety and Observation Level:  - Based on my clinical evaluation, I estimate the patient to be at minimal risk of self harm in the current setting. - At this time, we recommend  routine. This decision is based on my review of the chart including patient's history and current presentation, interview of the patient, mental status examination, and consideration of suicide risk including evaluating suicidal ideation, plan, intent, suicidal or self-harm behaviors, risk factors, and protective factors. This judgment is based on our ability to directly address suicide risk, implement suicide prevention strategies, and develop a safety plan while the patient is in the clinical setting. Please contact our team if there is a concern that risk level has changed.  CSSR Risk Category:C-SSRS RISK CATEGORY: No Risk  Suicide Risk Assessment: Patient has following modifiable risk factors for suicide: medication noncompliance, which we are addressing by currently in the hospital receiving medications but will need safe discharge plan. Patient has following  non-modifiable or demographic risk factors for suicide: male gender Patient has the following protective factors  against suicide: NA  Thank you for this consult request. Recommendations have been communicated to the primary team.  We will sign off at this time.   Majel GORMAN Ramp, [name]       History of Present Illness  Patient Report:  Patient reports no current concerns. States, "I'm finished with this here," referring to his meal. He greeted the provider appropriately, asking, "How are you doing?" and was observed attempting to mute the television but accidentally pressed the nurse call button, stating, "Yeah, can you make all this beeping stop?"  Collateral information:  Sari Darlis Benne [phone] Called twice and left message  Review of Systems  Psychiatric/Behavioral:  Positive for depression (Denies) and memory loss.   All other systems reviewed and are negative.    Psychiatric and Social History  Psychiatric History:  Information collected from Patient  Prev Dx/Sx: Bipolar Disorder Current Psych Provider: [facility] Home Meds (current): Cymbalta  and Depakote  Previous Med Trials: Cymablta, Depakote , Ingrezza  Therapy: UTA  Prior Psych Hospitalization: Yes  Prior Self Harm: Denies Prior Violence: Yes  Family Psych History: UTA Family Hx suicide: UTA  Social History:  Developmental Hx: Unremarkable Educational Hx: UTA Occupational Hx: Unemployed Legal Hx: UTA Living Situation: Homeless Spiritual Hx: UTA Access to weapons/lethal means: Denies   Substance History Alcohol : Denies  Tobacco: Denies  Illicit drugs: Denies  Prescription drug abuse: Denies  Rehab hx: Denies   Exam Findings  Physical Exam:  Vital Signs:  Temp:  [97.6 F (36.4 C)-97.7 F (36.5 C)] 97.6 F (36.4 C) (12/02 1413) Pulse Rate:  [56-65] 60 (12/02 1413) Resp:  [16-18] 16 (12/02 1413) BP: (100-111)/(65-69) 111/69 (12/02 1413) SpO2:  [94 %-97 %] 97 % (12/02 0553) Blood pressure  111/69, pulse 60, temperature 97.6 F (36.4 C), resp. rate 16, height 5' 10 (1.778 m), weight 83 kg, SpO2 97%. Body mass index is 26.26 kg/m.  Physical Exam Vitals and nursing note reviewed.  Constitutional:      Appearance: Normal appearance. He is normal weight.  Neurological:     General: No focal deficit present.     Mental Status: He is alert and oriented to person, place, and time. Mental status is at baseline.  Psychiatric:        Attention and Perception: He is inattentive.        Mood and Affect: Mood and affect normal.        Speech: Speech is delayed.        Behavior: Behavior is slowed. Behavior is cooperative.        Thought Content: Thought content normal.        Cognition and Memory: Cognition and memory normal.        Judgment: Judgment is impulsive and inappropriate.     Mental Status Exam: General Appearance: Casual  Orientation:  Person, place, and time disoriented to situation  Memory:  Fair  Concentration:  Fair  Recall: Fair  Attention  Fair  Eye Contact:  Good  Speech:  WDL  Language:  Fair  Volume:  WDL  Mood: denies depression and anxiety  Affect:  appropriate  Thought Process:  logical  Thought Content:  WDL  Suicidal Thoughts:  No  Homicidal Thoughts:  No  Judgement:  Fair  Insight:  Improving  Psychomotor Activity:  Tremor  Akathisia:  No  Fund of Knowledge:  Estimated to be below average      Assets:  None  Cognition:  Impaired,  Moderate  ADL's:  Impaired  AIMS (if  indicated):        Other History   These have been pulled in through the EMR, reviewed, and updated if appropriate.   Family History:  The patient's family history includes Anxiety disorder in his father and mother; Cancer in his paternal grandfather; Depression in his brother; Diabetes in his father; Heart attack in his maternal grandfather; Heart disease in his mother; Heart failure in his paternal grandmother; Kidney failure in his father; Mental illness in his  sister; Thyroid  disease in his brother and sister.  Medical History: Past Medical History:  Diagnosis Date   Anxiety    Arthritis    Chest heaviness    Chills    Depression, major    Diabetes mellitus without complication (HCC)    Dizzy    Dyslipidemia    ED (erectile dysfunction)    Elevated CPK    GERD (gastroesophageal reflux disease)    HA (headache)    Hypertension    Hypertriglyceridemia    Insomnia    Kidney stone    Left-sided Bell's palsy    Morbid obesity (HCC)    Nausea    No appetite    Panniculitis    of right lower leg   Parkinson's disease (HCC)    PTSD (post-traumatic stress disorder)    Routine screening for STI (sexually transmitted infection) 01/05/2023   Weakness     Surgical History: Past Surgical History:  Procedure Laterality Date   NO PAST SURGERIES     RADIOLOGY WITH ANESTHESIA N/A 03/13/2024   Procedure: MRI WITH ANESTHESIA;  Surgeon: Radiologist, Medication, MD;  Location: [facility] OR;  Service: Radiology;  Laterality: N/A;     Medications:   Current Facility-Administered Medications:    acetaminophen  (TYLENOL ) tablet 650 mg, 650 mg, Oral, Q6H PRN, 650 mg at 03/30/24 2123 **OR** acetaminophen  (TYLENOL ) suppository 650 mg, 650 mg, Rectal, Q6H PRN, Lou Claretta HERO, MD   amLODipine  (NORVASC ) tablet 5 mg, 5 mg, Oral, Daily, Gonfa, Taye T, MD, 5 mg at 04/10/24 1136   artificial tears ophthalmic solution 1 drop, 1 drop, Both Eyes, PRN, Regalado, Belkys A, MD   atorvastatin  (LIPITOR) tablet 20 mg, 20 mg, Oral, Daily, Mannie Pac T, DO, 20 mg at 04/10/24 1136   benzocaine  (ORAJEL) 10 % mucosal gel, , Mouth/Throat, QID PRN, Ghimire, Kuber, MD, Given at 03/18/24 2210   benztropine  (COGENTIN ) tablet 1 mg, 1 mg, Oral, BID, Mannie Pac T, DO, 1 mg at 04/10/24 1137   bisacodyl  (DULCOLAX) EC tablet 5 mg, 5 mg, Oral, Daily PRN, Lou Claretta HERO, MD, 5 mg at 03/22/24 2100   divalproex  (DEPAKOTE ) DR tablet 625 mg, 625 mg, Oral, Q8H, Starkes-Perry,  Shakila Mak S, [name], 625 mg at 04/10/24 1138   DULoxetine  (CYMBALTA ) DR capsule 40 mg, 40 mg, Oral, BID, Mannie Pac T, DO, 40 mg at 04/10/24 1136   enoxaparin  (LOVENOX ) injection 40 mg, 40 mg, Subcutaneous, Q24H, Amponsah, Claretta HERO, MD, 40 mg at 04/10/24 1137   feeding supplement (ENSURE PLUS HIGH PROTEIN) liquid 237 mL, 237 mL, Oral, BID BM, Gonfa, Taye T, MD, 237 mL at 04/06/24 1131   haloperidol  (HALDOL ) tablet 5 mg, 5 mg, Oral, Q8H PRN **OR** haloperidol  lactate (HALDOL ) injection 5 mg, 5 mg, Intravenous, Q8H PRN, Lord, Sharlot GRADE, NP   hydrOXYzine  (ATARAX ) tablet 50 mg, 50 mg, Oral, QHS, Regalado, Belkys A, MD, 50 mg at 04/09/24 2105   lip balm (CARMEX) ointment, , Topical, PRN, Regalado, Belkys A, MD, 1 Application at 03/12/24 0934   metFORMIN  (  GLUCOPHAGE ) tablet 500 mg, 500 mg, Oral, BID WC, Gonfa, Taye T, MD, 500 mg at 04/10/24 1136   multivitamin with minerals tablet 1 tablet, 1 tablet, Oral, Daily, Gonfa, Taye T, MD, 1 tablet at 04/10/24 1136   ondansetron  (ZOFRAN ) tablet 4 mg, 4 mg, Oral, Q6H PRN **OR** ondansetron  (ZOFRAN ) injection 4 mg, 4 mg, Intravenous, Q6H PRN, Lou Claretta HERO, MD   Oral care mouth rinse, 15 mL, Mouth Rinse, PRN, Gonfa, Taye T, MD   propranolol  (INDERAL ) tablet 10 mg, 10 mg, Oral, BID, Gonfa, Taye T, MD, 10 mg at 04/10/24 1136   senna-docusate (Senokot-S) tablet 1 tablet, 1 tablet, Oral, QHS PRN, Lou Claretta HERO, MD   traZODone  (DESYREL ) tablet 50 mg, 50 mg, Oral, QHS PRN, Jacquetta Sharlot GRADE, NP, 50 mg at 04/09/24 2105   valbenazine  (INGREZZA ) capsule 80 mg, 80 mg, Oral, Daily, Mannie Pac T, DO, 80 mg at 04/10/24 1137  Allergies: No Known Allergies  Majel GORMAN Ramp, [name]

## 2024-04-10 NOTE — Progress Notes (Signed)
 Occupational Therapy Treatment Patient Details Name: [name] [name] MRN: [id number] DOB: 06-10-1965 Today's Date: 04/10/2024   History of present illness Pt is a 58 yr old male presenting on 10/24 for fall and chronic back pain. Admitted from ED with acute metabolic encephalopathy. PMH includes: bipolar disorder type 1, tardive dyskinesia, PTSD, MDD, CAD, DM2, OA, tremors.   OT comments  The pt was seen for ADL instruction as pertains to toileting management at bathroom level, progression of functional activity, and functional strengthening. He required assist for supine to sit, sit to stand using a RW and for toileting management at bathroom level. He required intermittent cues for general safety during activity. He required consistent assist for balance/steadying in standing. He is making gradual functional progress. Continue OT plan of care. Patient will benefit from continued inpatient follow up therapy, <3 hours/day.       If plan is discharge home, recommend the following:  A little help with walking and/or transfers;A lot of help with bathing/dressing/bathroom;Assistance with cooking/housework;Direct supervision/assist for medications management;Direct supervision/assist for financial management;Assist for transportation;Help with stairs or ramp for entrance   Equipment Recommendations  BSC/3in1    Recommendations for Other Services      Precautions / Restrictions Precautions Precautions: Fall Restrictions Weight Bearing Restrictions Per Provider Order: No Other Position/Activity Restrictions: chronic back pain, history of Parkinson's disease       Mobility Bed Mobility Overal bed mobility: Needs Assistance Bed Mobility: Supine to Sit     Supine to sit: HOB elevated, Used rails Sit to supine: Min assist   General bed mobility comments: required assist for BLE off the bed and trunk shifting forward given slight posterior lean    Transfers Overall transfer level:  Needs assistance Equipment used: Rolling walker (2 wheels) Transfers: Sit to/from Stand Sit to Stand: Min assist, From elevated surface        Balance     Sitting balance-Leahy Scale: Good       Standing balance-Leahy Scale: Poor         ADL either performed or assessed with clinical judgement   ADL Overall ADL's : Needs assistance/impaired          Toilet Transfer: Minimal assistance;Cueing for sequencing;Cueing for safety;Ambulation;Rolling walker (2 wheels) Toilet Transfer Details (indicate cue type and reason): Pt required increased assist for balance/steadying in standing, assist and cues for walker positioning, cues for general safety during transfer, and cues to reach back prior to sitting. BSC commode frame placed over the toilet to create a raised toilet surface needed to facilitate improved ability to transfer off toilet. Toileting- Clothing Manipulation and Hygiene: Moderate assistance;Cueing for safety;Cueing for sequencing;Sit to/from stand Toileting - Clothing Manipulation Details (indicate cue type and reason): He required consistent min assist for balance/steadying in standing, intermittent cues to correct occasional posterior bias, assist for management of clothing/gown and min assist to ensure posterior peri-hygiene thoroughness.             Vision Baseline Vision/History: 1 Wears glasses               Cognition Arousal: Alert Behavior During Therapy: Flat affect Cognition: Cognition impaired   Orientation impairments: Time, Situation Awareness: Online awareness impaired Memory impairment (select all impairments): Declarative long-term memory, Working memory, Short-term memory Attention impairment (select first level of impairment): Divided attention Executive functioning impairment (select all impairments): Reasoning, Problem solving, Organization, Initiation                   Following  commands: Impaired Following commands impaired:  Follows one step commands with increased time      Cueing   Cueing Techniques: Verbal cues, Gestural cues, Tactile cues             Pertinent Vitals/ Pain       Pain Assessment Pain Assessment: Faces Pain Score: 2  Pain Location: chronic back pain Pain Descriptors / Indicators: Discomfort, Grimacing Pain Intervention(s): Limited activity within patient's tolerance, Monitored during session, Repositioned   Frequency  Min 2X/week        Progress Toward Goals  OT Goals(current goals can now be found in the care plan section)  Progress towards OT goals: Progressing toward goals  Acute Rehab OT Goals Patient Stated Goal: to go home OT Goal Formulation: With patient Time For Goal Achievement: 04/22/24 Potential to Achieve Goals: Good  Plan         AM-PAC OT 6 Clicks Daily Activity     Outcome Measure   Help from another person eating meals?: A Little Help from another person taking care of personal grooming?: A Little Help from another person toileting, which includes using toliet, bedpan, or urinal?: A Lot Help from another person bathing (including washing, rinsing, drying)?: A Lot Help from another person to put on and taking off regular upper body clothing?: A Little Help from another person to put on and taking off regular lower body clothing?: A Lot 6 Click Score: 15    End of Session Equipment Utilized During Treatment: Gait belt;Rolling walker (2 wheels)  OT Visit Diagnosis: Other abnormalities of gait and mobility (R26.89);Unsteadiness on feet (R26.81);Muscle weakness (generalized) (M62.81);History of falling (Z91.81);Other symptoms and signs involving cognitive function   Activity Tolerance Patient tolerated treatment well   Patient Left in bed;with call bell/phone within reach;with bed alarm set   Nurse Communication Mobility status        Time: 8396-8380 OT Time Calculation (min): 16 min  Charges: OT General Charges $OT Visit: 1 Visit OT  Treatments $Self Care/Home Management : 8-22 mins   [name], OTR/L 04/10/2024, 5:24 PM

## 2024-04-10 NOTE — Plan of Care (Signed)

## 2024-04-10 NOTE — Consult Note (Incomplete Revision)
 [facility] Psychiatric Consult Follow Up  Patient Name: .[name] [name]  MRN: [id number]  DOB: February 06, 1966  Consult Order details:  Orders (From admission, onward)     Start     Ordered   03/08/24 0958  IP CONSULT TO PSYCHIATRY       Ordering Provider: Madelyne Owen LABOR, MD  Provider:  (Not yet assigned)  Question Answer Comment  Location [facility]   Reason for Consult? adjustment of medications , admitted with delirium      03/08/24 0957             Mode of Visit: In person    Psychiatry Consult Evaluation  Service Date: April 10, 2024 LOS:  LOS: 34 days  Chief Complaint Delirium  Primary Psychiatric Diagnoses  Bipolar I Disorder 2.  Delirium resolved  Assessment  [name] [name] is a 58 y.o. male admitted: Medicallyfor 03/02/2024  3:27 PM for Acute Encephalopathy. He carries the psychiatric diagnoses of Bipolar I Disorder.  On initial examination, patient can be seen laying in bed with sitter at bedside. The interview is complicated by the patient's lethargy and being in and out of consciousness. He was oriented to person, place, and time but disoriented to situation. He was unable to report why he was currently in the hospital. Per the sitter at bedside she was informed that the patient had a history of aggression but he had behaved well this morning. He was eating fine and the plan was to make sure that he remained awake during the day shift. The patient denied any SI/HI/AVH or paranoid thoughts about the staff. He stated that he was unsure where he would be able to go once he was discharged from the hospital. Please see plan below for detailed recommendations.   04/03/2024:  Since the most recent medication adjustment--Depakote  increased from 750 mg PO BID to 625 mg PO TID (5 days ago)--the patient has demonstrated no further disruptive behaviors. Current valproic acid  level is 76, decreased slightly from 79. Given the clinical response, a  therapeutic range of 60-80 appears appropriate for this patient with possible dementia-related behaviors.  On evaluation today, the patient is calm and appropriate. He attempted to mute the television upon this provider entering the room but instead pressed the nurse call button, stating, "Yeah, can you make all this beeping stop?" He greeted the provider appropriately and denied any concerns. He was observed with food on the table and stated, "I'm finished with this here."  04/10/2024: Patient seen and reassessed following psychiatry re-consult for severe depression. He reports feeling depressed and expresses a desire to restart his previous medications. Chart review confirms that his psychiatric medications have already been restarted and are currently at moderate therapeutic doses. He is presently receiving Depakote  625 mg PO TID, duloxetine  40 mg PO BID, trazodone  50 mg PO QHS PRN, valbenazine  (Ingrezza ) 80 mg PO daily, and benztropine  (Cogentin ) 1 mg PO daily. Due to concurrent use of Ingrezza  and Cogentin , the benztropine  will be discontinued at this time. The patient reports moderate improvement in extrapyramidal and tardive dyskinesia symptoms. No additional PRN medications have been required during his 39-day hospital stay. A Depakote  level will be obtained in the morning to ensure it is not elevated or contributing to daytime sedation. Duloxetine  will be increased to 60 mg PO BID to further target depressive symptoms. No other medication adjustments are indicated at this time. Psychiatry will sign off, and the team may re-consult as needed for any changes  in mental status or behavior.  Diagnoses:  Active Hospital problems: Principal Problem:   Acute encephalopathy Active Problems:   Chronic low back pain without sciatica   Acute delirium   Generalized weakness   Bipolar 1 disorder (HCC)   Goals of care, counseling/discussion   Counseling and coordination of care   Palliative care  encounter    Plan   ## Psychiatric Medication Recommendations:   -Discontinue benztropine  (Cogentin ) due to concurrent use of valbenazine  (Ingrezza ).  -Increase duloxetine  (Cymbalta ) to 60 mg PO BID to further address depressive symptoms.  -Obtain Depakote  level in the morning to monitor for potential supratherapeutic levels or sedation.  -No other medication changes at this time.  -Continue supportive therapy, routine monitoring, and safety precautions per unit protocol.  ## Medical Decision Making Capacity: Patient lacks capacity to make medical decisions at this time. Would benefit from a competency evaluation.    EKG on 10/24 of 474  ## Further Work-up:  -- Per primary team -- Pertinent labwork reviewed earlier this admission includes:  Recent Results (from the past 2160 hours)  Urinalysis, Routine w reflex microscopic -Urine, Clean Catch     Status: None   Collection Time: 02/19/24  7:48 AM  Result Value Ref Range   Color, Urine YELLOW YELLOW   APPearance CLEAR CLEAR   Specific Gravity, Urine 1.018 1.005 - 1.030   pH 6.0 5.0 - 8.0   Glucose, UA NEGATIVE NEGATIVE mg/dL   Hgb urine dipstick NEGATIVE NEGATIVE   Bilirubin Urine NEGATIVE NEGATIVE   Ketones, ur NEGATIVE NEGATIVE mg/dL   Protein, ur NEGATIVE NEGATIVE mg/dL   Nitrite NEGATIVE NEGATIVE   Leukocytes,Ua NEGATIVE NEGATIVE    Comment: Performed at [facility], [address] [place], [place] [address]  CBC with Differential/Platelet     Status: None   Collection Time: 02/19/24  8:24 AM  Result Value Ref Range   WBC 8.4 4.0 - 10.5 K/uL   RBC 5.03 4.22 - 5.81 MIL/uL   Hemoglobin 14.2 13.0 - 17.0 g/dL   HCT 57.1 60.9 - 47.9 %   MCV 85.1 80.0 - 100.0 fL   MCH 28.2 26.0 - 34.0 pg   MCHC 33.2 30.0 - 36.0 g/dL   RDW 85.9 88.4 - 84.4 %   Platelets 217 150 - 400 K/uL   nRBC 0.0 0.0 - 0.2 %   Neutrophils Relative % 54 %   Neutro Abs 4.5 1.7 - 7.7 K/uL   Lymphocytes Relative 33 %   Lymphs Abs  2.8 0.7 - 4.0 K/uL   Monocytes Relative 11 %   Monocytes Absolute 0.9 0.1 - 1.0 K/uL   Eosinophils Relative 1 %   Eosinophils Absolute 0.0 0.0 - 0.5 K/uL   Basophils Relative 1 %   Basophils Absolute 0.1 0.0 - 0.1 K/uL   Immature Granulocytes 0 %   Abs Immature Granulocytes 0.03 0.00 - 0.07 K/uL    Comment: Performed at [facility], [address], [place], [place] [address]  Comprehensive metabolic panel with GFR     Status: Abnormal   Collection Time: 02/19/24  8:24 AM  Result Value Ref Range   Sodium 136 135 - 145 mmol/L   Potassium 4.1 3.5 - 5.1 mmol/L   Chloride 99 98 - 111 mmol/L   CO2 26 22 - 32 mmol/L   Glucose, Bld 122 (H) 70 - 99 mg/dL    Comment: Glucose reference range applies only to samples taken after fasting for at least 8  hours.   BUN 19 6 - 20 mg/dL   Creatinine, Ser 8.95 0.61 - 1.24 mg/dL   Calcium  9.8 8.9 - 10.3 mg/dL   Total Protein 6.9 6.5 - 8.1 g/dL   Albumin  4.2 3.5 - 5.0 g/dL   AST 17 15 - 41 U/L   ALT 10 0 - 44 U/L   Alkaline Phosphatase 49 38 - 126 U/L   Total Bilirubin 0.5 0.0 - 1.2 mg/dL   GFR, Estimated >39 >39 mL/min    Comment: (NOTE) Calculated using the CKD-EPI Creatinine Equation (2021)    Anion gap 11 5 - 15    Comment: Performed at [facility], [address] [place], [place] [address]  Comprehensive metabolic panel     Status: Abnormal   Collection Time: 02/25/24  7:59 PM  Result Value Ref Range   Sodium 135 135 - 145 mmol/L   Potassium 4.2 3.5 - 5.1 mmol/L   Chloride 99 98 - 111 mmol/L   CO2 23 22 - 32 mmol/L   Glucose, Bld 106 (H) 70 - 99 mg/dL    Comment: Glucose reference range applies only to samples taken after fasting for at least 8 hours.   BUN 17 6 - 20 mg/dL   Creatinine, Ser 8.80 0.61 - 1.24 mg/dL   Calcium  9.8 8.9 - 10.3 mg/dL   Total Protein 7.3 6.5 - 8.1 g/dL   Albumin  3.9 3.5 - 5.0 g/dL   AST 18 15 - 41 U/L   ALT 17 0 - 44 U/L   Alkaline Phosphatase 57 38 - 126 U/L   Total  Bilirubin 1.2 0.0 - 1.2 mg/dL   GFR, Estimated >39 >39 mL/min    Comment: (NOTE) Calculated using the CKD-EPI Creatinine Equation (2021)    Anion gap 13 5 - 15    Comment: Performed at [facility], [address], [place], [place] [address]  CBC     Status: None   Collection Time: 02/25/24  7:59 PM  Result Value Ref Range   WBC 9.7 4.0 - 10.5 K/uL   RBC 5.73 4.22 - 5.81 MIL/uL   Hemoglobin 16.4 13.0 - 17.0 g/dL   HCT 51.6 60.9 - 47.9 %   MCV 84.3 80.0 - 100.0 fL   MCH 28.6 26.0 - 34.0 pg   MCHC 34.0 30.0 - 36.0 g/dL   RDW 85.9 88.4 - 84.4 %   Platelets 288 150 - 400 K/uL   nRBC 0.0 0.0 - 0.2 %    Comment: Performed at [facility], [address] [place], [place] [address]  CBG monitoring, ED     Status: Abnormal   Collection Time: 02/25/24  8:01 PM  Result Value Ref Range   Glucose-Capillary 102 (H) 70 - 99 mg/dL    Comment: Glucose reference range applies only to samples taken after fasting for at least 8 hours.  Ammonia     Status: None   Collection Time: 02/25/24  8:33 PM  Result Value Ref Range   Ammonia 34 9 - 35 umol/L    Comment: HEMOLYSIS AT THIS LEVEL MAY AFFECT RESULT Performed at [facility], [address], [place], [place] [address]   Resp panel by RT-PCR (RSV, Flu A&B, Covid) Anterior Nasal Swab     Status: None   Collection Time: 02/25/24  8:45 PM   Specimen: Anterior Nasal Swab  Result Value Ref Range   SARS Coronavirus 2 by RT PCR NEGATIVE NEGATIVE  Influenza A by PCR NEGATIVE NEGATIVE   Influenza B by PCR NEGATIVE NEGATIVE    Comment: (NOTE) The Xpert Xpress SARS-CoV-2/FLU/RSV plus assay is intended as an aid in the diagnosis of influenza from Nasopharyngeal swab specimens and should not be used as a sole basis for treatment. Nasal washings and aspirates are unacceptable for Xpert Xpress SARS-CoV-2/FLU/RSV testing.  Fact Sheet for Patients: bloggercourse.com  Fact Sheet for Healthcare  Providers: seriousbroker.it  This test is not yet approved or cleared by the [place]  FDA and has been authorized for detection and/or diagnosis of SARS-CoV-2 by FDA under an Emergency Use Authorization (EUA). This EUA will remain in effect (meaning this test can be used) for the duration of the COVID-19 declaration under Section 564(b)(1) of the Act, 21 U.S.C. section 360bbb-3(b)(1), unless the authorization is terminated or revoked.     Resp Syncytial Virus by PCR NEGATIVE NEGATIVE    Comment: (NOTE) Fact Sheet for Patients: bloggercourse.com  Fact Sheet for Healthcare Providers: seriousbroker.it  This test is not yet approved or cleared by the [place]  FDA and has been authorized for detection and/or diagnosis of SARS-CoV-2 by FDA under an Emergency Use Authorization (EUA). This EUA will remain in effect (meaning this test can be used) for the duration of the COVID-19 declaration under Section 564(b)(1) of the Act, 21 U.S.C. section 360bbb-3(b)(1), unless the authorization is terminated or revoked.  Performed at [facility], [address] [place], [place] [address]   Ethanol     Status: None   Collection Time: 02/25/24  9:00 PM  Result Value Ref Range   Alcohol , Ethyl (B) <15 <15 mg/dL    Comment: (NOTE) For medical purposes only. Performed at [facility], [address], [place], [place] [address]   Urinalysis, Routine w reflex microscopic -Urine, Clean Catch     Status: Abnormal   Collection Time: 02/25/24 11:30 PM  Result Value Ref Range   Color, Urine YELLOW YELLOW   APPearance CLEAR CLEAR   Specific Gravity, Urine 1.018 1.005 - 1.030   pH 5.0 5.0 - 8.0   Glucose, UA NEGATIVE NEGATIVE mg/dL   Hgb urine dipstick NEGATIVE NEGATIVE   Bilirubin Urine NEGATIVE NEGATIVE   Ketones, ur 5 (A) NEGATIVE mg/dL   Protein, ur NEGATIVE NEGATIVE mg/dL   Nitrite NEGATIVE  NEGATIVE   Leukocytes,Ua NEGATIVE NEGATIVE    Comment: Performed at [facility], [address], [place], [place] [address]  Rapid urine drug screen (hospital performed)     Status: None   Collection Time: 02/25/24 11:30 PM  Result Value Ref Range   Opiates NONE DETECTED NONE DETECTED   Cocaine NONE DETECTED NONE DETECTED   Benzodiazepines NONE DETECTED NONE DETECTED   Amphetamines NONE DETECTED NONE DETECTED   Tetrahydrocannabinol NONE DETECTED NONE DETECTED   Barbiturates NONE DETECTED NONE DETECTED    Comment: (NOTE) DRUG SCREEN FOR MEDICAL PURPOSES ONLY.  IF CONFIRMATION IS NEEDED FOR ANY PURPOSE, NOTIFY LAB WITHIN 5 DAYS.  LOWEST DETECTABLE LIMITS FOR URINE DRUG SCREEN Drug Class                     Cutoff (ng/mL) Amphetamine and metabolites    1000 Barbiturate and metabolites    200 Benzodiazepine                 200 Opiates and metabolites        300 Cocaine and metabolites        300  THC                            50 Performed at [facility], [address], [place], [place] [address]   Basic metabolic panel     Status: Abnormal   Collection Time: 03/02/24  5:33 PM  Result Value Ref Range   Sodium 135 135 - 145 mmol/L   Potassium 4.5 3.5 - 5.1 mmol/L   Chloride 97 (L) 98 - 111 mmol/L   CO2 28 22 - 32 mmol/L   Glucose, Bld 98 70 - 99 mg/dL    Comment: Glucose reference range applies only to samples taken after fasting for at least 8 hours.   BUN 18 6 - 20 mg/dL   Creatinine, Ser 8.86 0.61 - 1.24 mg/dL   Calcium  10.0 8.9 - 10.3 mg/dL   GFR, Estimated >39 >39 mL/min    Comment: (NOTE) Calculated using the CKD-EPI Creatinine Equation (2021)    Anion gap 11 5 - 15    Comment: Performed at [facility], [address], [place], [place] [address]  CBC with Differential     Status: Abnormal   Collection Time: 03/02/24  5:33 PM  Result Value Ref Range   WBC 8.7 4.0 - 10.5 K/uL   RBC 6.05 (H) 4.22 - 5.81 MIL/uL   Hemoglobin 17.1 (H)  13.0 - 17.0 g/dL   HCT 48.0 60.9 - 47.9 %   MCV 85.8 80.0 - 100.0 fL   MCH 28.3 26.0 - 34.0 pg   MCHC 32.9 30.0 - 36.0 g/dL   RDW 86.2 88.4 - 84.4 %   Platelets 313 150 - 400 K/uL   nRBC 0.0 0.0 - 0.2 %   Neutrophils Relative % 56 %   Neutro Abs 5.0 1.7 - 7.7 K/uL   Lymphocytes Relative 32 %   Lymphs Abs 2.8 0.7 - 4.0 K/uL   Monocytes Relative 9 %   Monocytes Absolute 0.8 0.1 - 1.0 K/uL   Eosinophils Relative 1 %   Eosinophils Absolute 0.1 0.0 - 0.5 K/uL   Basophils Relative 1 %   Basophils Absolute 0.1 0.0 - 0.1 K/uL   Immature Granulocytes 1 %   Abs Immature Granulocytes 0.04 0.00 - 0.07 K/uL    Comment: Performed at [facility], [address], [place], [place] [address]  CBG monitoring, ED     Status: Abnormal   Collection Time: 03/03/24  9:06 AM  Result Value Ref Range   Glucose-Capillary 113 (H) 70 - 99 mg/dL    Comment: Glucose reference range applies only to samples taken after fasting for at least 8 hours.  Ammonia     Status: None   Collection Time: 03/07/24  3:35 PM  Result Value Ref Range   Ammonia <13 9 - 35 umol/L    Comment: Performed at [facility], [address], [place], [place] [address]  CBC with Differential     Status: Abnormal   Collection Time: 03/07/24  3:35 PM  Result Value Ref Range   WBC 7.9 4.0 - 10.5 K/uL   RBC 6.22 (H) 4.22 - 5.81 MIL/uL   Hemoglobin 17.7 (H) 13.0 - 17.0 g/dL   HCT 46.5 (H) 60.9 - 47.9 %   MCV 85.9 80.0 - 100.0 fL   MCH 28.5 26.0 - 34.0 pg   MCHC 33.1 30.0 - 36.0 g/dL   RDW 86.2 88.4 - 84.4 %  Platelets 316 150 - 400 K/uL   nRBC 0.0 0.0 - 0.2 %   Neutrophils Relative % 53 %   Neutro Abs 4.2 1.7 - 7.7 K/uL   Lymphocytes Relative 34 %   Lymphs Abs 2.7 0.7 - 4.0 K/uL   Monocytes Relative 10 %   Monocytes Absolute 0.8 0.1 - 1.0 K/uL   Eosinophils Relative 2 %   Eosinophils Absolute 0.1 0.0 - 0.5 K/uL   Basophils Relative 1 %   Basophils Absolute 0.1 0.0 - 0.1 K/uL   Immature  Granulocytes 0 %   Abs Immature Granulocytes 0.02 0.00 - 0.07 K/uL    Comment: Performed at [facility], [address], [place], [place] [address]  Comprehensive metabolic panel     Status: Abnormal   Collection Time: 03/07/24  3:35 PM  Result Value Ref Range   Sodium 137 135 - 145 mmol/L   Potassium 4.5 3.5 - 5.1 mmol/L   Chloride 97 (L) 98 - 111 mmol/L   CO2 28 22 - 32 mmol/L   Glucose, Bld 99 70 - 99 mg/dL    Comment: Glucose reference range applies only to samples taken after fasting for at least 8 hours.   BUN 21 (H) 6 - 20 mg/dL   Creatinine, Ser 8.87 0.61 - 1.24 mg/dL   Calcium  10.2 8.9 - 10.3 mg/dL   Total Protein 8.2 (H) 6.5 - 8.1 g/dL   Albumin  4.6 3.5 - 5.0 g/dL   AST 29 15 - 41 U/L   ALT 23 0 - 44 U/L   Alkaline Phosphatase 100 38 - 126 U/L   Total Bilirubin 0.8 0.0 - 1.2 mg/dL   GFR, Estimated >39 >39 mL/min    Comment: (NOTE) Calculated using the CKD-EPI Creatinine Equation (2021)    Anion gap 12 5 - 15    Comment: Performed at [facility], [address], [place], [place] [address]  Urinalysis, w/ Reflex to Culture (Infection Suspected) -Urine, Clean Catch     Status: Abnormal   Collection Time: 03/07/24  8:22 PM  Result Value Ref Range   Specimen Source URINE, CLEAN CATCH    Color, Urine YELLOW YELLOW   APPearance CLEAR CLEAR   Specific Gravity, Urine 1.024 1.005 - 1.030   pH 6.0 5.0 - 8.0   Glucose, UA NEGATIVE NEGATIVE mg/dL   Hgb urine dipstick NEGATIVE NEGATIVE   Bilirubin Urine NEGATIVE NEGATIVE   Ketones, ur 5 (A) NEGATIVE mg/dL   Protein, ur NEGATIVE NEGATIVE mg/dL   Nitrite NEGATIVE NEGATIVE   Leukocytes,Ua TRACE (A) NEGATIVE   RBC / HPF 0-5 0 - 5 RBC/hpf   WBC, UA 0-5 0 - 5 WBC/hpf    Comment:        Reflex urine culture not performed if WBC <=10, OR if Squamous epithelial cells >5. If Squamous epithelial cells >5 suggest recollection.    Bacteria, UA RARE (A) NONE SEEN   Squamous Epithelial / HPF 0-5  0 - 5 /HPF   Mucus PRESENT     Comment: Performed at [facility], [address], [place], [place] [address]  HIV Antibody (routine testing w rflx)     Status: None   Collection Time: 03/08/24  5:08 AM  Result Value Ref Range   HIV Screen 4th Generation wRfx Non Reactive Non Reactive    Comment: Performed at [facility], [address], [place], [place] [address]  Basic metabolic panel     Status: Abnormal  Collection Time: 03/08/24  5:08 AM  Result Value Ref Range   Sodium 136 135 - 145 mmol/L   Potassium 4.6 3.5 - 5.1 mmol/L   Chloride 98 98 - 111 mmol/L   CO2 22 22 - 32 mmol/L   Glucose, Bld 60 (L) 70 - 99 mg/dL    Comment: Glucose reference range applies only to samples taken after fasting for at least 8 hours.   BUN 22 (H) 6 - 20 mg/dL   Creatinine, Ser 8.86 0.61 - 1.24 mg/dL   Calcium  10.3 8.9 - 10.3 mg/dL   GFR, Estimated >39 >39 mL/min    Comment: (NOTE) Calculated using the CKD-EPI Creatinine Equation (2021)    Anion gap 17 (H) 5 - 15    Comment: Performed at [facility], [address] [place], [place] [address]  CBC     Status: Abnormal   Collection Time: 03/08/24  5:08 AM  Result Value Ref Range   WBC 8.5 4.0 - 10.5 K/uL   RBC 6.33 (H) 4.22 - 5.81 MIL/uL   Hemoglobin 17.7 (H) 13.0 - 17.0 g/dL   HCT 44.8 (H) 60.9 - 47.9 %   MCV 87.0 80.0 - 100.0 fL   MCH 28.0 26.0 - 34.0 pg   MCHC 32.1 30.0 - 36.0 g/dL   RDW 85.9 88.4 - 84.4 %   Platelets 333 150 - 400 K/uL   nRBC 0.0 0.0 - 0.2 %    Comment: Performed at [facility], [address], [place], [place] [address]  Vitamin B12     Status: None   Collection Time: 03/08/24  5:08 AM  Result Value Ref Range   Vitamin B-12 573 180 - 914 pg/mL    Comment: Performed at [facility], [address], [place], [place] [address]  TSH     Status: None   Collection Time: 03/08/24  5:08 AM  Result Value Ref Range   TSH 2.640 0.350 - 4.500  uIU/mL    Comment: Performed at [facility], [address] [place], [place] [address]  Vitamin B1     Status: None   Collection Time: 03/08/24  5:08 AM  Result Value Ref Range   Vitamin B1 (Thiamine ) 151.8 66.5 - 200.0 nmol/L    Comment: (NOTE) This test was developed and its performance characteristics determined by [facility]. It has not been cleared or approved by the Food and Drug Administration. Performed At: [facility] [address] [place], [place] [id number] Jennette Shorter MD Ey:[id number]   Glucose, capillary     Status: None   Collection Time: 03/08/24  7:30 AM  Result Value Ref Range   Glucose-Capillary 95 70 - 99 mg/dL    Comment: Glucose reference range applies only to samples taken after fasting for at least 8 hours.  Blood gas, arterial     Status: Abnormal   Collection Time: 03/08/24 10:15 AM  Result Value Ref Range   FIO2 21 %   pH, Arterial 7.49 (H) 7.35 - 7.45   pCO2 arterial 34 32 - 48 mmHg   pO2, Arterial 65 (L) 83 - 108 mmHg   Bicarbonate 26.1 20.0 - 28.0 mmol/L   Acid-Base Excess 2.8 (H) 0.0 - 2.0 mmol/L   O2 Saturation 95.5 %   Patient temperature 36.7    Collection site RIGHT RADIAL    Drawn by [id number]    Allens test (pass/fail) PASS PASS    Comment: Performed at [facility], [address] [place], [place] [address]  Glucose, capillary     Status: Abnormal   Collection Time: 03/08/24 11:48 AM  Result Value Ref Range   Glucose-Capillary 179 (H) 70 - 99 mg/dL    Comment: Glucose reference range applies only to samples taken after fasting for at least 8 hours.  Valproic acid  level     Status: None   Collection Time: 03/08/24  3:41 PM  Result Value Ref Range   Valproic Acid  Lvl 73 50 - 100 ug/mL    Comment: Performed at [facility], [address] [place], [place] [address]  Glucose, capillary     Status: Abnormal   Collection Time: 03/08/24  4:33 PM  Result Value Ref Range    Glucose-Capillary 104 (H) 70 - 99 mg/dL    Comment: Glucose reference range applies only to samples taken after fasting for at least 8 hours.  Glucose, capillary     Status: None   Collection Time: 03/08/24  8:20 PM  Result Value Ref Range   Glucose-Capillary 93 70 - 99 mg/dL    Comment: Glucose reference range applies only to samples taken after fasting for at least 8 hours.  Glucose, capillary     Status: None   Collection Time: 03/09/24  7:29 AM  Result Value Ref Range   Glucose-Capillary 84 70 - 99 mg/dL    Comment: Glucose reference range applies only to samples taken after fasting for at least 8 hours.  CBC     Status: None   Collection Time: 03/09/24  8:21 AM  Result Value Ref Range   WBC 6.4 4.0 - 10.5 K/uL   RBC 5.57 4.22 - 5.81 MIL/uL   Hemoglobin 15.9 13.0 - 17.0 g/dL   HCT 52.0 60.9 - 47.9 %   MCV 86.0 80.0 - 100.0 fL   MCH 28.5 26.0 - 34.0 pg   MCHC 33.2 30.0 - 36.0 g/dL   RDW 86.4 88.4 - 84.4 %   Platelets 260 150 - 400 K/uL   nRBC 0.0 0.0 - 0.2 %    Comment: Performed at [facility], [address], [place], [place] [address]  Basic metabolic panel     Status: None   Collection Time: 03/09/24  8:21 AM  Result Value Ref Range   Sodium 138 135 - 145 mmol/L   Potassium 4.2 3.5 - 5.1 mmol/L   Chloride 101 98 - 111 mmol/L   CO2 25 22 - 32 mmol/L   Glucose, Bld 82 70 - 99 mg/dL    Comment: Glucose reference range applies only to samples taken after fasting for at least 8 hours.   BUN 19 6 - 20 mg/dL   Creatinine, Ser 9.07 0.61 - 1.24 mg/dL   Calcium  9.6 8.9 - 10.3 mg/dL   GFR, Estimated >39 >39 mL/min    Comment: (NOTE) Calculated using the CKD-EPI Creatinine Equation (2021)    Anion gap 12 5 - 15    Comment: Performed at [facility], [address], [place], [place] [address]  Glucose, capillary     Status: Abnormal   Collection Time: 03/09/24 11:32 AM  Result Value Ref Range   Glucose-Capillary 118 (H) 70 - 99 mg/dL     Comment: Glucose reference range applies only to samples taken after fasting for at least 8 hours.  Glucose, capillary     Status: Abnormal   Collection Time: 03/09/24  4:17 PM  Result Value Ref Range  Glucose-Capillary 121 (H) 70 - 99 mg/dL    Comment: Glucose reference range applies only to samples taken after fasting for at least 8 hours.  Glucose, capillary     Status: Abnormal   Collection Time: 03/09/24  9:13 PM  Result Value Ref Range   Glucose-Capillary 102 (H) 70 - 99 mg/dL    Comment: Glucose reference range applies only to samples taken after fasting for at least 8 hours.  Glucose, capillary     Status: Abnormal   Collection Time: 03/10/24  9:29 AM  Result Value Ref Range   Glucose-Capillary 171 (H) 70 - 99 mg/dL    Comment: Glucose reference range applies only to samples taken after fasting for at least 8 hours.  Glucose, capillary     Status: Abnormal   Collection Time: 03/10/24 12:57 PM  Result Value Ref Range   Glucose-Capillary 133 (H) 70 - 99 mg/dL    Comment: Glucose reference range applies only to samples taken after fasting for at least 8 hours.  Glucose, capillary     Status: None   Collection Time: 03/10/24  5:11 PM  Result Value Ref Range   Glucose-Capillary 99 70 - 99 mg/dL    Comment: Glucose reference range applies only to samples taken after fasting for at least 8 hours.  Glucose, capillary     Status: Abnormal   Collection Time: 03/10/24 10:01 PM  Result Value Ref Range   Glucose-Capillary 110 (H) 70 - 99 mg/dL    Comment: Glucose reference range applies only to samples taken after fasting for at least 8 hours.  Glucose, capillary     Status: None   Collection Time: 03/11/24  7:23 AM  Result Value Ref Range   Glucose-Capillary 87 70 - 99 mg/dL    Comment: Glucose reference range applies only to samples taken after fasting for at least 8 hours.  Glucose, capillary     Status: Abnormal   Collection Time: 03/11/24 12:05 PM  Result Value Ref Range    Glucose-Capillary 186 (H) 70 - 99 mg/dL    Comment: Glucose reference range applies only to samples taken after fasting for at least 8 hours.  Glucose, capillary     Status: None   Collection Time: 03/11/24  4:37 PM  Result Value Ref Range   Glucose-Capillary 87 70 - 99 mg/dL    Comment: Glucose reference range applies only to samples taken after fasting for at least 8 hours.  Glucose, capillary     Status: None   Collection Time: 03/11/24  9:11 PM  Result Value Ref Range   Glucose-Capillary 95 70 - 99 mg/dL    Comment: Glucose reference range applies only to samples taken after fasting for at least 8 hours.  CBC     Status: None   Collection Time: 03/12/24  4:29 AM  Result Value Ref Range   WBC 5.9 4.0 - 10.5 K/uL   RBC 5.50 4.22 - 5.81 MIL/uL   Hemoglobin 15.2 13.0 - 17.0 g/dL   HCT 52.3 60.9 - 47.9 %   MCV 86.5 80.0 - 100.0 fL   MCH 27.6 26.0 - 34.0 pg   MCHC 31.9 30.0 - 36.0 g/dL   RDW 86.3 88.4 - 84.4 %   Platelets 222 150 - 400 K/uL   nRBC 0.0 0.0 - 0.2 %    Comment: Performed at [facility], [address], [place], [place] [address]  Basic metabolic panel with GFR     Status: None  Collection Time: 03/12/24  4:29 AM  Result Value Ref Range   Sodium 139 135 - 145 mmol/L   Potassium 3.9 3.5 - 5.1 mmol/L   Chloride 101 98 - 111 mmol/L   CO2 28 22 - 32 mmol/L   Glucose, Bld 78 70 - 99 mg/dL    Comment: Glucose reference range applies only to samples taken after fasting for at least 8 hours.   BUN 14 6 - 20 mg/dL   Creatinine, Ser 8.97 0.61 - 1.24 mg/dL   Calcium  9.7 8.9 - 10.3 mg/dL   GFR, Estimated >39 >39 mL/min    Comment: (NOTE) Calculated using the CKD-EPI Creatinine Equation (2021)    Anion gap 10 5 - 15    Comment: Performed at [facility], [address] [place], [place] [address]  Glucose, capillary     Status: None   Collection Time: 03/12/24  7:38 AM  Result Value Ref Range   Glucose-Capillary 79 70 - 99 mg/dL     Comment: Glucose reference range applies only to samples taken after fasting for at least 8 hours.  Glucose, capillary     Status: None   Collection Time: 03/12/24 11:35 AM  Result Value Ref Range   Glucose-Capillary 95 70 - 99 mg/dL    Comment: Glucose reference range applies only to samples taken after fasting for at least 8 hours.  Glucose, capillary     Status: None   Collection Time: 03/12/24  4:21 PM  Result Value Ref Range   Glucose-Capillary 96 70 - 99 mg/dL    Comment: Glucose reference range applies only to samples taken after fasting for at least 8 hours.  Glucose, capillary     Status: None   Collection Time: 03/12/24  8:47 PM  Result Value Ref Range   Glucose-Capillary 97 70 - 99 mg/dL    Comment: Glucose reference range applies only to samples taken after fasting for at least 8 hours.   Comment 1 Notify RN    Comment 2 Document in Chart   Glucose, capillary     Status: None   Collection Time: 03/13/24 10:38 AM  Result Value Ref Range   Glucose-Capillary 91 70 - 99 mg/dL    Comment: Glucose reference range applies only to samples taken after fasting for at least 8 hours.  MRSA Next Gen by PCR, Nasal     Status: Abnormal   Collection Time: 03/13/24  2:17 PM   Specimen: Nasal Mucosa; Nasal Swab  Result Value Ref Range   MRSA by PCR Next Gen DETECTED (A) NOT DETECTED    Comment: (NOTE) The GeneXpert MRSA Assay (FDA approved for NASAL specimens only), is one component of a comprehensive MRSA colonization surveillance program. It is not intended to diagnose MRSA infection nor to guide or monitor treatment for MRSA infections. Test performance is not FDA approved in patients less than 65 years old. Performed at [facility], [address] [place], [place] [address]   CBC     Status: None   Collection Time: 03/13/24  2:42 PM  Result Value Ref Range   WBC 6.1 4.0 - 10.5 K/uL   RBC 5.08 4.22 - 5.81 MIL/uL   Hemoglobin 14.1 13.0 - 17.0 g/dL   HCT  55.4 60.9 - 47.9 %   MCV 87.6 80.0 - 100.0 fL   MCH 27.8 26.0 - 34.0 pg   MCHC 31.7 30.0 - 36.0 g/dL   RDW 86.3 88.4 - 84.4 %  Platelets 213 150 - 400 K/uL   nRBC 0.0 0.0 - 0.2 %    Comment: Performed at [facility], [address], [place], [place] [address]  Basic metabolic panel     Status: None   Collection Time: 03/13/24  2:42 PM  Result Value Ref Range   Sodium 137 135 - 145 mmol/L   Potassium 4.1 3.5 - 5.1 mmol/L   Chloride 103 98 - 111 mmol/L   CO2 26 22 - 32 mmol/L   Glucose, Bld 85 70 - 99 mg/dL    Comment: Glucose reference range applies only to samples taken after fasting for at least 8 hours.   BUN 15 6 - 20 mg/dL   Creatinine, Ser 9.12 0.61 - 1.24 mg/dL   Calcium  9.1 8.9 - 10.3 mg/dL   GFR, Estimated >39 >39 mL/min    Comment: (NOTE) Calculated using the CKD-EPI Creatinine Equation (2021)    Anion gap 9 5 - 15    Comment: Performed at [facility], [address], [place], [place] [address]  Glucose, capillary     Status: None   Collection Time: 03/13/24  3:59 PM  Result Value Ref Range   Glucose-Capillary 77 70 - 99 mg/dL    Comment: Glucose reference range applies only to samples taken after fasting for at least 8 hours.   Comment 1 Notify RN    Comment 2 Document in Chart   Glucose, capillary     Status: Abnormal   Collection Time: 03/13/24  9:32 PM  Result Value Ref Range   Glucose-Capillary 64 (L) 70 - 99 mg/dL    Comment: Glucose reference range applies only to samples taken after fasting for at least 8 hours.   Comment 1 Notify RN    Comment 2 Document in Chart   Glucose, capillary     Status: None   Collection Time: 03/13/24  9:35 PM  Result Value Ref Range   Glucose-Capillary 71 70 - 99 mg/dL    Comment: Glucose reference range applies only to samples taken after fasting for at least 8 hours.   Comment 1 Notify RN    Comment 2 Document in Chart   Glucose, capillary     Status: None   Collection Time:  03/13/24 10:45 PM  Result Value Ref Range   Glucose-Capillary 94 70 - 99 mg/dL    Comment: Glucose reference range applies only to samples taken after fasting for at least 8 hours.   Comment 1 Notify RN    Comment 2 Document in Chart   Glucose, capillary     Status: None   Collection Time: 03/14/24  3:04 AM  Result Value Ref Range   Glucose-Capillary 87 70 - 99 mg/dL    Comment: Glucose reference range applies only to samples taken after fasting for at least 8 hours.  CBC     Status: None   Collection Time: 03/14/24  3:05 AM  Result Value Ref Range   WBC 4.8 4.0 - 10.5 K/uL   RBC 5.14 4.22 - 5.81 MIL/uL   Hemoglobin 14.6 13.0 - 17.0 g/dL   HCT 54.2 60.9 - 47.9 %   MCV 88.9 80.0 - 100.0 fL   MCH 28.4 26.0 - 34.0 pg   MCHC 31.9 30.0 - 36.0 g/dL   RDW 86.3 88.4 - 84.4 %   Platelets 182 150 - 400 K/uL   nRBC 0.0 0.0 - 0.2 %    Comment: Performed at [facility], [address], [place], [place] [address]  Basic metabolic panel with GFR     Status: None   Collection Time: 03/14/24  3:05 AM  Result Value Ref Range   Sodium 138 135 - 145 mmol/L   Potassium 3.9 3.5 - 5.1 mmol/L   Chloride 102 98 - 111 mmol/L   CO2 28 22 - 32 mmol/L   Glucose, Bld 83 70 - 99 mg/dL    Comment: Glucose reference range applies only to samples taken after fasting for at least 8 hours.   BUN 12 6 - 20 mg/dL   Creatinine, Ser 9.19 0.61 - 1.24 mg/dL   Calcium  9.3 8.9 - 10.3 mg/dL   GFR, Estimated >39 >39 mL/min    Comment: (NOTE) Calculated using the CKD-EPI Creatinine Equation (2021)    Anion gap 8 5 - 15    Comment: Performed at [facility], [address], [place], [place] [address]  Glucose, capillary     Status: Abnormal   Collection Time: 03/14/24  5:02 PM  Result Value Ref Range   Glucose-Capillary 108 (H) 70 - 99 mg/dL    Comment: Glucose reference range applies only to samples taken after fasting for at least 8 hours.  Glucose, capillary     Status:  Abnormal   Collection Time: 03/14/24 10:03 PM  Result Value Ref Range   Glucose-Capillary 154 (H) 70 - 99 mg/dL    Comment: Glucose reference range applies only to samples taken after fasting for at least 8 hours.  Glucose, capillary     Status: Abnormal   Collection Time: 03/15/24  8:00 AM  Result Value Ref Range   Glucose-Capillary 113 (H) 70 - 99 mg/dL    Comment: Glucose reference range applies only to samples taken after fasting for at least 8 hours.  Glucose, capillary     Status: None   Collection Time: 03/15/24 12:06 PM  Result Value Ref Range   Glucose-Capillary 89 70 - 99 mg/dL    Comment: Glucose reference range applies only to samples taken after fasting for at least 8 hours.  Glucose, capillary     Status: Abnormal   Collection Time: 03/15/24  5:06 PM  Result Value Ref Range   Glucose-Capillary 149 (H) 70 - 99 mg/dL    Comment: Glucose reference range applies only to samples taken after fasting for at least 8 hours.  Glucose, capillary     Status: Abnormal   Collection Time: 03/15/24  9:07 PM  Result Value Ref Range   Glucose-Capillary 119 (H) 70 - 99 mg/dL    Comment: Glucose reference range applies only to samples taken after fasting for at least 8 hours.   Comment 1 Notify RN   Glucose, capillary     Status: Abnormal   Collection Time: 03/16/24  7:24 AM  Result Value Ref Range   Glucose-Capillary 101 (H) 70 - 99 mg/dL    Comment: Glucose reference range applies only to samples taken after fasting for at least 8 hours.  Glucose, capillary     Status: Abnormal   Collection Time: 03/16/24 11:24 AM  Result Value Ref Range   Glucose-Capillary 144 (H) 70 - 99 mg/dL    Comment: Glucose reference range applies only to samples taken after fasting for at least 8 hours.  MRSA Next Gen by PCR, Nasal     Status: Abnormal   Collection Time: 03/16/24  2:20 PM   Specimen: Nasal Mucosa; Nasal Swab  Result Value Ref Range  MRSA by PCR Next Gen DETECTED (A) NOT DETECTED     Comment: (NOTE) The GeneXpert MRSA Assay (FDA approved for NASAL specimens only), is one component of a comprehensive MRSA colonization surveillance program. It is not intended to diagnose MRSA infection nor to guide or monitor treatment for MRSA infections. Test performance is not FDA approved in patients less than 38 years old. Performed at [facility], [address] [place], [place] [address]   Glucose, capillary     Status: Abnormal   Collection Time: 03/16/24  5:01 PM  Result Value Ref Range   Glucose-Capillary 127 (H) 70 - 99 mg/dL    Comment: Glucose reference range applies only to samples taken after fasting for at least 8 hours.  Glucose, capillary     Status: Abnormal   Collection Time: 03/16/24  9:25 PM  Result Value Ref Range   Glucose-Capillary 109 (H) 70 - 99 mg/dL    Comment: Glucose reference range applies only to samples taken after fasting for at least 8 hours.   Comment 1 Notify RN    Comment 2 Document in Chart   Glucose, capillary     Status: Abnormal   Collection Time: 03/17/24  7:44 AM  Result Value Ref Range   Glucose-Capillary 117 (H) 70 - 99 mg/dL    Comment: Glucose reference range applies only to samples taken after fasting for at least 8 hours.  Glucose, capillary     Status: Abnormal   Collection Time: 03/17/24 11:53 AM  Result Value Ref Range   Glucose-Capillary 143 (H) 70 - 99 mg/dL    Comment: Glucose reference range applies only to samples taken after fasting for at least 8 hours.  Glucose, capillary     Status: Abnormal   Collection Time: 03/17/24  4:56 PM  Result Value Ref Range   Glucose-Capillary 119 (H) 70 - 99 mg/dL    Comment: Glucose reference range applies only to samples taken after fasting for at least 8 hours.  Glucose, capillary     Status: Abnormal   Collection Time: 03/17/24  9:21 PM  Result Value Ref Range   Glucose-Capillary 112 (H) 70 - 99 mg/dL    Comment: Glucose reference range applies only to samples  taken after fasting for at least 8 hours.   Comment 1 Notify RN   Glucose, capillary     Status: Abnormal   Collection Time: 03/18/24  7:31 AM  Result Value Ref Range   Glucose-Capillary 120 (H) 70 - 99 mg/dL    Comment: Glucose reference range applies only to samples taken after fasting for at least 8 hours.   Comment 1 Notify RN   Glucose, capillary     Status: Abnormal   Collection Time: 03/18/24 11:26 AM  Result Value Ref Range   Glucose-Capillary 174 (H) 70 - 99 mg/dL    Comment: Glucose reference range applies only to samples taken after fasting for at least 8 hours.   Comment 1 Notify RN   Glucose, capillary     Status: Abnormal   Collection Time: 03/18/24  5:18 PM  Result Value Ref Range   Glucose-Capillary 130 (H) 70 - 99 mg/dL    Comment: Glucose reference range applies only to samples taken after fasting for at least 8 hours.   Comment 1 Notify RN   Glucose, capillary     Status: Abnormal   Collection Time: 03/18/24  8:57 PM  Result Value Ref Range   Glucose-Capillary 148 (H) 70 -  99 mg/dL    Comment: Glucose reference range applies only to samples taken after fasting for at least 8 hours.   Comment 1 Notify RN   Glucose, capillary     Status: Abnormal   Collection Time: 03/19/24  8:02 AM  Result Value Ref Range   Glucose-Capillary 123 (H) 70 - 99 mg/dL    Comment: Glucose reference range applies only to samples taken after fasting for at least 8 hours.  Glucose, capillary     Status: Abnormal   Collection Time: 03/19/24 11:57 AM  Result Value Ref Range   Glucose-Capillary 128 (H) 70 - 99 mg/dL    Comment: Glucose reference range applies only to samples taken after fasting for at least 8 hours.  Glucose, capillary     Status: Abnormal   Collection Time: 03/19/24  4:51 PM  Result Value Ref Range   Glucose-Capillary 116 (H) 70 - 99 mg/dL    Comment: Glucose reference range applies only to samples taken after fasting for at least 8 hours.  Glucose, capillary      Status: Abnormal   Collection Time: 03/19/24  9:05 PM  Result Value Ref Range   Glucose-Capillary 139 (H) 70 - 99 mg/dL    Comment: Glucose reference range applies only to samples taken after fasting for at least 8 hours.  Glucose, capillary     Status: Abnormal   Collection Time: 03/19/24 10:11 PM  Result Value Ref Range   Glucose-Capillary 136 (H) 70 - 99 mg/dL    Comment: Glucose reference range applies only to samples taken after fasting for at least 8 hours.  Glucose, capillary     Status: Abnormal   Collection Time: 03/20/24  7:44 AM  Result Value Ref Range   Glucose-Capillary 132 (H) 70 - 99 mg/dL    Comment: Glucose reference range applies only to samples taken after fasting for at least 8 hours.  Glucose, capillary     Status: Abnormal   Collection Time: 03/20/24 11:39 AM  Result Value Ref Range   Glucose-Capillary 148 (H) 70 - 99 mg/dL    Comment: Glucose reference range applies only to samples taken after fasting for at least 8 hours.  Glucose, capillary     Status: Abnormal   Collection Time: 03/20/24  4:46 PM  Result Value Ref Range   Glucose-Capillary 154 (H) 70 - 99 mg/dL    Comment: Glucose reference range applies only to samples taken after fasting for at least 8 hours.  Glucose, capillary     Status: Abnormal   Collection Time: 03/20/24  7:52 PM  Result Value Ref Range   Glucose-Capillary 159 (H) 70 - 99 mg/dL    Comment: Glucose reference range applies only to samples taken after fasting for at least 8 hours.  Glucose, capillary     Status: Abnormal   Collection Time: 03/21/24  7:36 AM  Result Value Ref Range   Glucose-Capillary 127 (H) 70 - 99 mg/dL    Comment: Glucose reference range applies only to samples taken after fasting for at least 8 hours.  Glucose, capillary     Status: Abnormal   Collection Time: 03/21/24 11:42 AM  Result Value Ref Range   Glucose-Capillary 170 (H) 70 - 99 mg/dL    Comment: Glucose reference range applies only to samples taken  after fasting for at least 8 hours.  Glucose, capillary     Status: Abnormal   Collection Time: 03/21/24  4:46 PM  Result Value Ref Range  Glucose-Capillary 181 (H) 70 - 99 mg/dL    Comment: Glucose reference range applies only to samples taken after fasting for at least 8 hours.  Glucose, capillary     Status: Abnormal   Collection Time: 03/21/24  9:08 PM  Result Value Ref Range   Glucose-Capillary 136 (H) 70 - 99 mg/dL    Comment: Glucose reference range applies only to samples taken after fasting for at least 8 hours.  Comprehensive metabolic panel with GFR     Status: Abnormal   Collection Time: 03/22/24  4:36 AM  Result Value Ref Range   Sodium 137 135 - 145 mmol/L   Potassium 3.6 3.5 - 5.1 mmol/L   Chloride 101 98 - 111 mmol/L   CO2 25 22 - 32 mmol/L   Glucose, Bld 112 (H) 70 - 99 mg/dL    Comment: Glucose reference range applies only to samples taken after fasting for at least 8 hours.   BUN 15 6 - 20 mg/dL   Creatinine, Ser 9.15 0.61 - 1.24 mg/dL   Calcium  9.4 8.9 - 10.3 mg/dL   Total Protein 6.6 6.5 - 8.1 g/dL   Albumin  3.8 3.5 - 5.0 g/dL   AST 24 15 - 41 U/L   ALT 34 0 - 44 U/L   Alkaline Phosphatase 69 38 - 126 U/L   Total Bilirubin 0.6 0.0 - 1.2 mg/dL   GFR, Estimated >39 >39 mL/min    Comment: (NOTE) Calculated using the CKD-EPI Creatinine Equation (2021)    Anion gap 11 5 - 15    Comment: Performed at [facility], [address] [place], [place] [address]  CBC     Status: None   Collection Time: 03/22/24  4:36 AM  Result Value Ref Range   WBC 6.5 4.0 - 10.5 K/uL   RBC 5.71 4.22 - 5.81 MIL/uL   Hemoglobin 16.4 13.0 - 17.0 g/dL   HCT 51.4 60.9 - 47.9 %   MCV 84.9 80.0 - 100.0 fL   MCH 28.7 26.0 - 34.0 pg   MCHC 33.8 30.0 - 36.0 g/dL   RDW 85.7 88.4 - 84.4 %   Platelets 203 150 - 400 K/uL   nRBC 0.0 0.0 - 0.2 %    Comment: Performed at [facility], [address] [place], [place] [address]  Glucose, capillary      Status: Abnormal   Collection Time: 03/22/24  7:57 AM  Result Value Ref Range   Glucose-Capillary 138 (H) 70 - 99 mg/dL    Comment: Glucose reference range applies only to samples taken after fasting for at least 8 hours.  Glucose, capillary     Status: Abnormal   Collection Time: 03/22/24 11:51 AM  Result Value Ref Range   Glucose-Capillary 138 (H) 70 - 99 mg/dL    Comment: Glucose reference range applies only to samples taken after fasting for at least 8 hours.  Glucose, capillary     Status: Abnormal   Collection Time: 03/22/24  4:49 PM  Result Value Ref Range   Glucose-Capillary 147 (H) 70 - 99 mg/dL    Comment: Glucose reference range applies only to samples taken after fasting for at least 8 hours.  Glucose, capillary     Status: Abnormal   Collection Time: 03/22/24  9:17 PM  Result Value Ref Range   Glucose-Capillary 145 (H) 70 - 99 mg/dL    Comment: Glucose reference range applies only to samples taken after fasting for at least 8  hours.   Comment 1 Notify RN    Comment 2 Document in Chart   Glucose, capillary     Status: Abnormal   Collection Time: 03/23/24  7:43 AM  Result Value Ref Range   Glucose-Capillary 123 (H) 70 - 99 mg/dL    Comment: Glucose reference range applies only to samples taken after fasting for at least 8 hours.  Glucose, capillary     Status: Abnormal   Collection Time: 03/23/24 11:35 AM  Result Value Ref Range   Glucose-Capillary 166 (H) 70 - 99 mg/dL    Comment: Glucose reference range applies only to samples taken after fasting for at least 8 hours.  Glucose, capillary     Status: Abnormal   Collection Time: 03/23/24  5:14 PM  Result Value Ref Range   Glucose-Capillary 144 (H) 70 - 99 mg/dL    Comment: Glucose reference range applies only to samples taken after fasting for at least 8 hours.  Glucose, capillary     Status: Abnormal   Collection Time: 03/23/24  8:55 PM  Result Value Ref Range   Glucose-Capillary 154 (H) 70 - 99 mg/dL    Comment:  Glucose reference range applies only to samples taken after fasting for at least 8 hours.   Comment 1 Notify RN    Comment 2 Document in Chart   Glucose, capillary     Status: Abnormal   Collection Time: 03/24/24  7:19 AM  Result Value Ref Range   Glucose-Capillary 117 (H) 70 - 99 mg/dL    Comment: Glucose reference range applies only to samples taken after fasting for at least 8 hours.  Glucose, capillary     Status: Abnormal   Collection Time: 03/24/24 10:55 AM  Result Value Ref Range   Glucose-Capillary 153 (H) 70 - 99 mg/dL    Comment: Glucose reference range applies only to samples taken after fasting for at least 8 hours.  Glucose, capillary     Status: Abnormal   Collection Time: 03/24/24  4:54 PM  Result Value Ref Range   Glucose-Capillary 117 (H) 70 - 99 mg/dL    Comment: Glucose reference range applies only to samples taken after fasting for at least 8 hours.   Comment 1 Notify RN   Glucose, capillary     Status: Abnormal   Collection Time: 03/24/24  9:19 PM  Result Value Ref Range   Glucose-Capillary 147 (H) 70 - 99 mg/dL    Comment: Glucose reference range applies only to samples taken after fasting for at least 8 hours.   Comment 1 Notify RN   Glucose, capillary     Status: Abnormal   Collection Time: 03/25/24  7:15 AM  Result Value Ref Range   Glucose-Capillary 120 (H) 70 - 99 mg/dL    Comment: Glucose reference range applies only to samples taken after fasting for at least 8 hours.  Glucose, capillary     Status: Abnormal   Collection Time: 03/25/24 12:11 PM  Result Value Ref Range   Glucose-Capillary 142 (H) 70 - 99 mg/dL    Comment: Glucose reference range applies only to samples taken after fasting for at least 8 hours.  Comprehensive metabolic panel with GFR     Status: Abnormal   Collection Time: 03/26/24  4:36 AM  Result Value Ref Range   Sodium 137 135 - 145 mmol/L   Potassium 3.8 3.5 - 5.1 mmol/L   Chloride 100 98 - 111 mmol/L   CO2 26 22 - 32 mmol/L  Glucose, Bld 104 (H) 70 - 99 mg/dL    Comment: Glucose reference range applies only to samples taken after fasting for at least 8 hours.   BUN 13 6 - 20 mg/dL   Creatinine, Ser 9.16 0.61 - 1.24 mg/dL   Calcium  9.7 8.9 - 10.3 mg/dL   Total Protein 7.2 6.5 - 8.1 g/dL   Albumin  4.0 3.5 - 5.0 g/dL   AST 24 15 - 41 U/L   ALT 29 0 - 44 U/L   Alkaline Phosphatase 78 38 - 126 U/L   Total Bilirubin 0.8 0.0 - 1.2 mg/dL   GFR, Estimated >39 >39 mL/min    Comment: (NOTE) Calculated using the CKD-EPI Creatinine Equation (2021)    Anion gap 12 5 - 15    Comment: Performed at [facility], [address] [place], [place] [address]  CBC     Status: Abnormal   Collection Time: 03/26/24  4:36 AM  Result Value Ref Range   WBC 7.9 4.0 - 10.5 K/uL   RBC 6.00 (H) 4.22 - 5.81 MIL/uL   Hemoglobin 17.3 (H) 13.0 - 17.0 g/dL   HCT 48.2 60.9 - 47.9 %   MCV 86.2 80.0 - 100.0 fL   MCH 28.8 26.0 - 34.0 pg   MCHC 33.5 30.0 - 36.0 g/dL   RDW 85.9 88.4 - 84.4 %   Platelets 176 150 - 400 K/uL   nRBC 0.0 0.0 - 0.2 %    Comment: Performed at [facility], [address] [place], [place] [address]  Phosphorus     Status: None   Collection Time: 03/26/24  4:36 AM  Result Value Ref Range   Phosphorus 3.3 2.5 - 4.6 mg/dL    Comment: Performed at [facility], [address], [place], [place] [address]  Magnesium      Status: None   Collection Time: 03/26/24  4:36 AM  Result Value Ref Range   Magnesium  1.9 1.7 - 2.4 mg/dL    Comment: Performed at [facility], [address], [place], [place] [address]  Valproic acid  level     Status: None   Collection Time: 03/28/24  5:25 PM  Result Value Ref Range   Valproic Acid  Lvl 79 50 - 100 ug/mL    Comment: Performed at [facility], [address], [place], [place] [address]  Glucose, capillary     Status: Abnormal   Collection Time: 03/28/24  7:50 PM  Result Value Ref  Range   Glucose-Capillary 138 (H) 70 - 99 mg/dL    Comment: Glucose reference range applies only to samples taken after fasting for at least 8 hours.  Renal function panel     Status: None   Collection Time: 03/31/24  5:04 AM  Result Value Ref Range   Sodium 137 135 - 145 mmol/L   Potassium 3.5 3.5 - 5.1 mmol/L   Chloride 100 98 - 111 mmol/L   CO2 27 22 - 32 mmol/L   Glucose, Bld 86 70 - 99 mg/dL    Comment: Glucose reference range applies only to samples taken after fasting for at least 8 hours.   BUN 13 6 - 20 mg/dL   Creatinine, Ser 9.03 0.61 - 1.24 mg/dL   Calcium  9.4 8.9 - 10.3 mg/dL   Phosphorus 2.8 2.5 - 4.6 mg/dL   Albumin  3.6 3.5 - 5.0 g/dL   GFR, Estimated >39 >39 mL/min    Comment: (NOTE) Calculated using the  CKD-EPI Creatinine Equation (2021)    Anion gap 10 5 - 15    Comment: Performed at [facility], [address] [place], [place] [address]  Magnesium      Status: None   Collection Time: 03/31/24  5:04 AM  Result Value Ref Range   Magnesium  2.0 1.7 - 2.4 mg/dL    Comment: Performed at [facility], [address] [place], [place] [address]  CBC     Status: None   Collection Time: 03/31/24  5:04 AM  Result Value Ref Range   WBC 6.4 4.0 - 10.5 K/uL   RBC 5.53 4.22 - 5.81 MIL/uL   Hemoglobin 15.8 13.0 - 17.0 g/dL   HCT 52.0 60.9 - 47.9 %   MCV 86.6 80.0 - 100.0 fL   MCH 28.6 26.0 - 34.0 pg   MCHC 33.0 30.0 - 36.0 g/dL   RDW 85.8 88.4 - 84.4 %   Platelets 190 150 - 400 K/uL   nRBC 0.0 0.0 - 0.2 %    Comment: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

## 2024-04-11 DIAGNOSIS — G9340 Encephalopathy, unspecified: Secondary | ICD-10-CM | POA: Diagnosis not present

## 2024-04-11 DIAGNOSIS — Z7189 Other specified counseling: Secondary | ICD-10-CM | POA: Diagnosis not present

## 2024-04-11 DIAGNOSIS — R531 Weakness: Secondary | ICD-10-CM | POA: Diagnosis not present

## 2024-04-11 DIAGNOSIS — M545 Low back pain, unspecified: Secondary | ICD-10-CM | POA: Diagnosis not present

## 2024-04-11 LAB — VALPROIC ACID LEVEL: Valproic Acid Lvl: 62 ug/mL (ref 50–100)

## 2024-04-11 NOTE — [place] FL2 (Signed)
 [place]  MEDICAID [id number] LEVEL OF CARE FORM     IDENTIFICATION  Patient Name: [name] [name] Birthdate: 11-14-1965 Sex: male Admission Date (Current Location): 03/02/2024  [facility] and [place] Number:  Producer, Television/film/video and Address:  [facility],  [address] [place] [address]      Provider Number: [id number]  Attending Physician Name and Address:  Kathrin Mignon DASEN, MD  Relative Name and Phone Number:       Current Level of Care: Hospital Recommended Level of Care: Skilled Nursing Facility Prior Approval Number:    Date Approved/Denied:   PASRR Number: [id number] E Expired 03/31/2024  Discharge Plan: SNF    Current Diagnoses: Patient Active Problem List   Diagnosis Date Noted   Goals of care, counseling/discussion 03/27/2024   Counseling and coordination of care 03/27/2024   Palliative care encounter 03/27/2024   Acute delirium 03/08/2024   Generalized weakness 03/08/2024   Bipolar 1 disorder (HCC) 03/08/2024   Acute encephalopathy 03/07/2024   Tardive dyskinesia 02/29/2024   Physical exam, annual 12/08/2023   Chronic low back pain without sciatica 05/10/2023   Herpes simplex antibody positive 01/13/2023   Type 2 diabetes mellitus with hyperglycemia, without long-term current use of insulin  (HCC) 09/01/2022   Essential hypertension 09/01/2022   Mixed hyperlipidemia 09/01/2022   Tremor 09/01/2022   Bipolar I disorder, most recent episode depressed (HCC) 10/21/2021   Generalized anxiety disorder 01/23/2020   Moderate episode of recurrent major depressive disorder (HCC) 01/23/2020   Primary osteoarthritis of right knee 10/31/2019   MDD (major depressive disorder) 10/24/2019   PTSD (post-traumatic stress disorder) 10/24/2019    Orientation RESPIRATION BLADDER Height & Weight     Self, Time, Situation  Normal Continent Weight: 83 kg Height:  5' 10 (177.8 cm)  BEHAVIORAL SYMPTOMS/MOOD NEUROLOGICAL BOWEL NUTRITION STATUS    Petit mal Continent  Diet  AMBULATORY STATUS COMMUNICATION OF NEEDS Skin   Extensive Assist Verbally Normal                       Personal Care Assistance Level of Assistance  Bathing, Feeding, Dressing Bathing Assistance: Limited assistance Feeding assistance: Independent Dressing Assistance: Limited assistance     Functional Limitations Info  Sight, Hearing, Speech Sight Info: Adequate Hearing Info: Adequate Speech Info: Adequate    SPECIAL CARE FACTORS FREQUENCY  PT (By licensed PT), OT (By licensed OT)     PT Frequency: 5x weekly OT Frequency: 5x weekly            Contractures Contractures Info: Not present    Additional Factors Info  Code Status, Allergies, Psychotropic Code Status Info: DNR-Limited Allergies Info: NKDA Psychotropic Info: Deparkote, Cymbalta , Atarax , Desyrel          Current Medications (04/11/2024):  This is the current hospital active medication list Current Facility-Administered Medications  Medication Dose Route Frequency Provider Last Rate Last Admin   acetaminophen  (TYLENOL ) tablet 650 mg  650 mg Oral Q6H PRN Amponsah, Prosper M, MD   650 mg at 03/30/24 2123   Or   acetaminophen  (TYLENOL ) suppository 650 mg  650 mg Rectal Q6H PRN Lou Claretta HERO, MD       amLODipine  (NORVASC ) tablet 5 mg  5 mg Oral Daily Gonfa, Taye T, MD   5 mg at 04/11/24 1019   artificial tears ophthalmic solution 1 drop  1 drop Both Eyes PRN Regalado, Belkys A, MD       atorvastatin  (LIPITOR) tablet 20 mg  20 mg  Oral Daily Mannie Pac T, DO   20 mg at 04/11/24 1052   benzocaine  (ORAJEL) 10 % mucosal gel   Mouth/Throat QID PRN Raenelle Coria, MD   Given at 03/18/24 2210   bisacodyl  (DULCOLAX) EC tablet 5 mg  5 mg Oral Daily PRN Amponsah, Prosper M, MD   5 mg at 03/22/24 2100   divalproex  (DEPAKOTE ) DR tablet 625 mg  625 mg Oral Q8H Wilkie Majel [name], [name]   625 mg at 04/11/24 1052   DULoxetine  (CYMBALTA ) DR capsule 60 mg  60 mg Oral BID Starkes-Perry, Takia S, [name]   60 mg  at 04/11/24 1015   enoxaparin  (LOVENOX ) injection 40 mg  40 mg Subcutaneous Q24H Amponsah, Prosper M, MD   40 mg at 04/11/24 1015   feeding supplement (ENSURE PLUS HIGH PROTEIN) liquid 237 mL  237 mL Oral BID BM Gonfa, Taye T, MD   237 mL at 04/11/24 1019   haloperidol  (HALDOL ) tablet 5 mg  5 mg Oral Q8H PRN Jacquetta Sharlot GRADE, NP       Or   haloperidol  lactate (HALDOL ) injection 5 mg  5 mg Intravenous Q8H PRN Jacquetta Sharlot GRADE, NP       hydrOXYzine  (ATARAX ) tablet 50 mg  50 mg Oral QHS Regalado, Belkys A, MD   50 mg at 04/10/24 2113   lip balm (CARMEX) ointment   Topical PRN Regalado, Belkys A, MD   1 Application at 03/12/24 0934   metFORMIN  (GLUCOPHAGE ) tablet 500 mg  500 mg Oral BID WC Gonfa, Taye T, MD   500 mg at 04/11/24 1015   multivitamin with minerals tablet 1 tablet  1 tablet Oral Daily Gonfa, Taye T, MD   1 tablet at 04/11/24 1016   ondansetron  (ZOFRAN ) tablet 4 mg  4 mg Oral Q6H PRN Amponsah, Prosper M, MD       Or   ondansetron  (ZOFRAN ) injection 4 mg  4 mg Intravenous Q6H PRN Amponsah, Prosper M, MD       Oral care mouth rinse  15 mL Mouth Rinse PRN Gonfa, Taye T, MD       propranolol  (INDERAL ) tablet 10 mg  10 mg Oral BID Gonfa, Taye T, MD   10 mg at 04/11/24 1019   senna-docusate (Senokot-S) tablet 1 tablet  1 tablet Oral QHS PRN Lou Claretta HERO, MD       traZODone  (DESYREL ) tablet 50 mg  50 mg Oral QHS PRN Lord, Jamison Y, NP   50 mg at 04/09/24 2105   valbenazine  (INGREZZA ) capsule 80 mg  80 mg Oral Daily Mannie Pac T, DO   80 mg at 04/11/24 1015     Discharge Medications: Please see discharge summary for a list of discharge medications.  Relevant Imaging Results:  Relevant Lab Results:   Additional Information SSN [id number]  Doneta Glenys DASEN, RN

## 2024-04-11 NOTE — TOC Progression Note (Addendum)
 Transition of Care [facility]) - Progression Note    Patient Details  Name: [name] [name] MRN: [id number] Date of Birth: 12-14-1965  [facility]) CM/SW Contact  Doneta Glenys DASEN, RN Phone Number: 04/11/2024, 2:28 PM  Clinical Narrative:    APS meet with patient at bedside. CM waiting on update. CM completed about FL2 for short term. Completed another PASRR. PASRR came back a Level 2. Awaiting provider signature for FL2 and 30 day note.    Barriers to Discharge: SNF Pending bed offer               Expected Discharge Plan and Services                                               Social Drivers of Health (SDOH) Interventions SDOH Screenings   Food Insecurity: Patient Unable To Answer (03/07/2024)  Housing: Unknown (03/07/2024)  Transportation Needs: Patient Unable To Answer (03/07/2024)  Utilities: Patient Unable To Answer (03/07/2024)  Depression (PHQ2-9): High Risk (01/26/2024)  Social Connections: Unknown (09/22/2021)   Received from [facility]  Tobacco Use: Low Risk  (03/13/2024)    Readmission Risk Interventions     No data to display

## 2024-04-11 NOTE — Plan of Care (Signed)

## 2024-04-11 NOTE — Progress Notes (Signed)
 PROGRESS NOTE  [name] [name] FMW:[id number] DOB: 06/22/1965   PCP: [name], [name]  Patient is from: Home.  Lives with friend, [name].  No immediate family members.  Only one estranged brother that does not want to talk to him.  DOA: 03/02/2024 LOS: 35  Chief complaints Chief Complaint  Patient presents with   Placement     Brief Narrative / Interim history: 58 year old M with PMH of type I bipolar disorder, tardive dyskinesia, PTSD, MDD, GAD, DM-2, tremor, frequent falls and HLD brought to ED by EMS on 10/18 due to talking crazy.   Per EDP note, roommate/friend reported problems with the patient having frequent falls in the house, difficulty caring for himself, erratic behavior including today where he had a very loud outburst in front of her, calling her names, and then was overheard stating he may kill himself.  She is concerned because he has access to 2 guns in the house.     Patient was boarded in the emergency room since 10/18 10/19, psychiatry consulted as patient was endorsing suicidal thoughts-IVC 10/22, psychiatry cleared for discharge. 10/24, patient was noted to be encephalopathic so admitted to the hospital.  11/4, MRI under sedation, transient hypotension and altered mental status-overnight in ICU. 11/5, clinically improved.  Sedation wore off.  Transferred back to MedSurg bed.    Has no capacity. Two-physician DNR on 03/27/2024 after discussion with palliative medicine given patient's multiple medical comorbidity. APS involved.  Waiting on placement.   Subjective: Seen and examined earlier this morning.  No major events overnight or this morning.  Sleepy but wakes to voice.  No complaints.  Assessment and plan: Acute metabolic encephalopathy in a patient with underlying psychiatric disorder: Multifactorial including positive underlying cognitive impairment, dehydration, delirium and iatrogenic.  CT head and MRI brain without acute finding to explain  patient's behavioral issue or encephalopathy.  TSH, B12, EEG x 3 unrevealing.  Improved. -Reorientation and delirium precaution   Bipolar 1 disorder/MDD/GAD/PTSD: Aggression and combativeness improved.  Has been off restraints for awhile. -Appreciate psych recommendation-discontinued Cogentin  on 12/2. -Increased Cymbalta  to 60 mg twice daily on 12/2. -Continue Ingrezza  80 mg daily.   -Continue Depakote  625 mg 3 times daily.  Depakote  level 62 on 12/3. -Continue Atarax  50 mg nightly.  -Continue trazodone  and Haldol  as needed.   Medical decision making capacity: Oriented to self and place for most part but not time.  Has no insight into his medical condition and treatment options.  Evaluated by psychiatry on 11/23.  He did not demonstrate capacity to make his own medical decision.  APS involved.  Hypotension 2/2 sedation: Resolved. Essential hypertension: Normotensive. -Continue low-dose amlodipine . -Continue home Inderal -decreased to twice daily given borderline heart rate. -Continue holding lisinopril  due to risk for AKI.  NIDDM-2: A1c 6.3%.  CBG within acceptable range.  CBG monitoring discontinued. -Continue low-dose metformin .   Physical deconditioning/recurrent falls  -Therapy recommended SNF.   Hypoxemia: Resolved.  On room air.  Hyperlipidemia - Continue statin.   Vitamin D  deficiency: -Continue vitamin D  supplementation  Dehydration: dry and chapped lips - IV fluid bolus intermittently.  Goal of care: Poor long-term prognosis due to underlying complex mental health and very limited social support.  He has not demonstrated capacity to make his own medical decisions so far.  Has no immediate family member.  Reportedly, only one estranged brother that does not want to talk to him. Lived with friend, [name] before this admission. Two person DNR after discussion with palliative medicine  on 11/18. - APS involved for guardianship.  Increased nutrient needs Body mass index is  26.26 kg/m. Nutrition Problem: Increased nutrient needs Etiology: acute illness Signs/Symptoms: estimated needs Interventions: Ensure Enlive (each supplement provides 350kcal and 20 grams of protein), MVI   DVT prophylaxis:  enoxaparin  (LOVENOX ) injection 40 mg Start: 03/08/24 0800  Code Status: DNR. Family Communication: None at bedside Level of care: Med-Surg Status is: Inpatient Remains inpatient appropriate because: Waiting on APS guardianship and SNF bed   Final disposition: SNF   35 minutes with more than 50% spent in reviewing records, counseling patient/family and coordinating care.  Consultants:  Psychiatry Palliative medicine  Procedures: None  Microbiology summarized: MRSA PCR screen positive.  Objective: Vitals:   04/10/24 1413 04/10/24 2025 04/11/24 0455 04/11/24 1019  BP: 111/69 93/63 114/71 115/81  Pulse: 60 65 (!) 54 71  Resp: 16 15 16    Temp: 97.6 F (36.4 C) 98.5 F (36.9 C)    TempSrc:  Axillary    SpO2:  95% 94%   Weight:      Height:        Examination:  GENERAL: No apparent distress.  Nontoxic. HEENT: Chapped and dry lips.  Vision and hearing grossly intact.  NECK: Supple.  No apparent JVD.  RESP:  No IWOB.  On room air. CVS:  RRR. Heart sounds normal.  ABD/GI/GU: BS+. Abd soft, NTND.  MSK/EXT:  Moves extremities.  SKIN: no apparent skin lesion or wound NEURO: Sleepy but wakes to voice.  Awake and oriented to self, place and date of birth.  PSYCH: Calm.  No agitation or distress.  Sch Meds:  Scheduled Meds:  amLODipine   5 mg Oral Daily   atorvastatin   20 mg Oral Daily   divalproex   625 mg Oral Q8H   DULoxetine   60 mg Oral BID   enoxaparin  (LOVENOX ) injection  40 mg Subcutaneous Q24H   feeding supplement  237 mL Oral BID BM   hydrOXYzine   50 mg Oral QHS   metFORMIN   500 mg Oral BID WC   multivitamin with minerals  1 tablet Oral Daily   propranolol   10 mg Oral BID   valbenazine   80 mg Oral Daily   Continuous  Infusions:   PRN Meds:.acetaminophen  **OR** acetaminophen , artificial tears, benzocaine , bisacodyl , haloperidol  **OR** haloperidol  lactate, lip balm, ondansetron  **OR** ondansetron  (ZOFRAN ) IV, mouth rinse, senna-docusate, traZODone   Antimicrobials: Anti-infectives (From admission, onward)    Start     Dose/Rate Route Frequency Ordered Stop   03/12/24 1515  amoxicillin -clavulanate (AUGMENTIN ) 875-125 MG per tablet 1 tablet        1 tablet Oral Every 12 hours 03/12/24 1420 03/17/24 0959   03/12/24 1515  doxycycline  (VIBRA -TABS) tablet 100 mg  Status:  Discontinued        100 mg Oral Every 12 hours 03/12/24 1420 03/15/24 1100        I have personally reviewed the following labs and images: CBC: No results for input(s): WBC, NEUTROABS, HGB, HCT, MCV, PLT in the last 168 hours.  BMP &GFR No results for input(s): NA, K, CL, CO2, GLUCOSE, BUN, CREATININE, CALCIUM , MG, PHOS in the last 168 hours.  Invalid input(s): GFRCG  Estimated Creatinine Clearance: 85.5 mL/min (by C-G formula based on SCr of 0.96 mg/dL). Liver & Pancreas: No results for input(s): AST, ALT, ALKPHOS, BILITOT, PROT, ALBUMIN  in the last 168 hours.  No results for input(s): LIPASE, AMYLASE in the last 168 hours. No results for input(s): AMMONIA in the last 168 hours. Diabetic:  No results for input(s): HGBA1C in the last 72 hours. No results for input(s): GLUCAP in the last 168 hours.  Cardiac Enzymes: No results for input(s): CKTOTAL, CKMB, CKMBINDEX, TROPONINI in the last 168 hours.  No results for input(s): PROBNP in the last 8760 hours. Coagulation Profile: No results for input(s): INR, PROTIME in the last 168 hours. Thyroid  Function Tests: No results for input(s): TSH, T4TOTAL, FREET4, T3FREE, THYROIDAB in the last 72 hours. Lipid Profile: No results for input(s): CHOL, HDL, LDLCALC, TRIG, CHOLHDL, LDLDIRECT in the  last 72 hours. Anemia Panel: No results for input(s): VITAMINB12, FOLATE, FERRITIN, TIBC, IRON, RETICCTPCT in the last 72 hours. Urine analysis:    Component Value Date/Time   COLORURINE YELLOW 03/07/2024 2022   APPEARANCEUR CLEAR 03/07/2024 2022   LABSPEC 1.024 03/07/2024 2022   PHURINE 6.0 03/07/2024 2022   GLUCOSEU NEGATIVE 03/07/2024 2022   HGBUR NEGATIVE 03/07/2024 2022   BILIRUBINUR NEGATIVE 03/07/2024 2022   KETONESUR 5 (A) 03/07/2024 2022   PROTEINUR NEGATIVE 03/07/2024 2022   UROBILINOGEN 1.0 10/24/2008 1155   NITRITE NEGATIVE 03/07/2024 2022   LEUKOCYTESUR TRACE (A) 03/07/2024 2022   Sepsis Labs: Invalid input(s): PROCALCITONIN, LACTICIDVEN  Microbiology: No results found for this or any previous visit (from the past 240 hours).   Radiology Studies: No results found.    Arcelia Pals T. Lemario Chaikin [facility]  If 7PM-7AM, please contact night-coverage [email or web address] 04/11/2024, 10:27 AM

## 2024-04-11 NOTE — TOC PASRR Note (Cosign Needed)
 30 Day PASRR Note   Patient Details  Name: [name] [name] Date of Birth: June 28, 1965   [facility]) CM/SW Contact:    Doneta Glenys DASEN, RN Phone Number: 04/11/2024, 2:27 PM  To Whom It May Concern:  Please be advised that this patient will require a short-term nursing home stay - anticipated 30 days or less for rehabilitation and strengthening.   The plan is for return home.

## 2024-04-12 DIAGNOSIS — R531 Weakness: Secondary | ICD-10-CM | POA: Diagnosis not present

## 2024-04-12 DIAGNOSIS — Z7189 Other specified counseling: Secondary | ICD-10-CM | POA: Diagnosis not present

## 2024-04-12 DIAGNOSIS — G9340 Encephalopathy, unspecified: Secondary | ICD-10-CM | POA: Diagnosis not present

## 2024-04-12 DIAGNOSIS — F319 Bipolar disorder, unspecified: Secondary | ICD-10-CM | POA: Diagnosis not present

## 2024-04-12 DIAGNOSIS — Z515 Encounter for palliative care: Secondary | ICD-10-CM | POA: Diagnosis not present

## 2024-04-12 DIAGNOSIS — M545 Low back pain, unspecified: Secondary | ICD-10-CM | POA: Diagnosis not present

## 2024-04-12 NOTE — Plan of Care (Signed)

## 2024-04-12 NOTE — Progress Notes (Signed)
 Daily Progress Note   Patient Name: [name] [name]       Date: 04/12/2024 DOB: 08/28/1965  Age: 58 y.o. MRN#: [id number] Attending Physician: Kathrin Mignon DASEN, MD Primary Care Physician: [name], [name] Admit Date: 03/02/2024 Length of Stay: 36 days  Reason for Consultation/Follow-up: Establishing goals of care  Subjective:   Reviewed EMR including recent documentation from hospitalist, TOC, and psychiatry.  TOC still assisting with disposition.  APS involved to assist with decision making and guardianship.  Psychiatry has provided recommendations regarding medication management.  Presented to bedside to see patient.  Patient laying calmly in bed not very interactive.  Not in restraints.  Objective:   Vital Signs:  BP 102/78   Pulse (!) 55   Temp 97.7 F (36.5 C) (Oral)   Resp 16   Ht 5' 10 (1.778 m)   Wt 83 kg   SpO2 96%   BMI 26.26 kg/m   Physical Exam: General: Awake, calmly laying in bed Cardiovascular: Bradycardia noted Respiratory: no increased work of breathing noted, not in respiratory distress Abdomen: not distended Neuro: Awake  Assessment & Plan:   Assessment: Patient is a 58 year old male with a past medical history of type I bipolar disorder, tardive dyskinesia, PTSD, MDD, GAD, tremor, frequent falls, and hyperlipidemia who was brought to the ED on 02/25/2023 by EMS for talking crazy.  Patient's roommate/friend reported patient was having frequent falls, erratic behavior, and difficulty caring for himself at home; roommate had also overheard patient stating that he may kill himself which was concerning since he has access to 2 guns in the house.  Initially patient was endorsing suicidal thoughts and IVC was obtained by psychiatry though psychiatry cleared for discharge on 02/29/2024.  Patient then noted to be encephalopathic and so was admitted to the hospital on 03/02/2024.  Hospitalization further complicated by hypotension requiring ICU admission though  has currently resolved.  TOC involved to seek guardianship for patient as no family or friends willing to make decisions on patient's behalf.  Palliative medicine team consulted to assist with complex medical decision making.   Recommendations/Plan: # Complex medical decision making/goals of care:   - Patient unable to participate in complex medical decision making due to underlying medical status.  Patient does not have friends or family willing to make decisions on his behalf.  Psychiatry continuing appropriate adjustment of medications for psychiatric illness management.  TOC working with APS for guardianship and placement.  Palliative medicine team will continue to follow along peripherally though please reach out if acute PMT needs arise.                   -Already discussed with hospitalist about two-physician DNR based on patient's multiple medical comorbidities.  Continuing medically appropriate and reasonable medical interventions at this time and these are being done according to excepted practice and standards of care.  In the event of cardiac or respiratory arrest, an attempt at resuscitation would be unsuccessful in returning this patient to an already fragile state of health and cause unnecessary suffering in a patient with these multiple underlying medical comorbidities. Support placing a maintaining a DNR order and support the care plan as outlined by the primary service.   # Psycho-social/Spiritual Support:  - TOC seeking guardianship/working with DSS   # Discharge Planning:  To Be Determined  Thank you for allowing the palliative care team to participate in the care [name].  Tinnie Radar, DO Palliative Care Provider PMT # [phone]  If patient remains symptomatic despite maximum doses, please call PMT at [phone] between 0700 and 1900. Outside of these hours, please call attending, as PMT does not have night coverage.

## 2024-04-12 NOTE — Progress Notes (Signed)
 Physical Therapy Treatment Patient Details Name: [name] [name] MRN: [id number] DOB: 1965/07/27 Today's Date: 04/12/2024   History of Present Illness Pt is a 58 y/o male presenting on 10/24 for fall and chronic back pain. 10/19 psychiatry consulted due to suicidal thoughts, cleared on 10/22. Change in mental status due to acute delirium, and admitted from ED with acute metabolic encephalopathy. CT negative, EEG negative, MRI pending, chest x ray with hypoventilation. PMH includes: bipolar disorder type 1, tardive dyskinesia, PTSD, MDD, CAD, DM2, OA, tremors.    PT Comments  The  patient is alert O x self, hospital, month. Patient noted restless in bed, appears to be trying to get up. Patient assisted to sitting, Ambulated  into BR then down hall x 200' using RW and min assistance for balance . Gait speed appears improved but does require assistance for balance. Patient will benefit from continued inpatient follow up therapy, <3 hours/day    If plan is discharge home, recommend the following: Assistance with cooking/housework;Assist for transportation;Help with stairs or ramp for entrance;Direct supervision/assist for medications management;A little help with walking and/or transfers;A little help with bathing/dressing/bathroom;Direct supervision/assist for financial management   Can travel by private vehicle     Yes  Equipment Recommendations  None recommended by PT    Recommendations for Other Services       Precautions / Restrictions Precautions Precautions: Fall Precaution/Restrictions Comments: decr awareness of balance deficits     Mobility  Bed Mobility   Bed Mobility: Supine to Sit     Supine to sit: HOB elevated, Used rails Sit to supine: Min assist   General bed mobility comments: rolls to the  right with rail, gets legs over bed edge, light min assist to pull to sitting,    Transfers Overall transfer level: Needs assistance Equipment used: Rolling walker (2  wheels) Transfers: Sit to/from Stand Sit to Stand: Min assist, From elevated surface           General transfer comment: pulls to power up to stand from bed and toilet, pushes legs back against surface, posterior bias    Ambulation/Gait Ambulation/Gait assistance: Min assist Gait Distance (Feet): 20 Feet (then 200) Assistive device: Rolling walker (2 wheels) Gait Pattern/deviations: Step-through pattern, Decreased stride length, Narrow base of support, Shuffle Gait velocity: decreased     General Gait Details: increased distance, improved forward progression and gait speed, noted tremors  of arms, legs.   Stairs             Wheelchair Mobility     Tilt Bed    Modified Rankin (Stroke Patients Only)       Balance Overall balance assessment: Needs assistance Sitting-balance support: Feet supported Sitting balance-Leahy Scale: Fair Sitting balance - Comments: static sitting-good. dynamic sitting-fair+ Postural control: Posterior lean Standing balance support: Bilateral upper extremity supported, During functional activity, Reliant on assistive device for balance   Standing balance comment: CGA to min assist, posterior bias                            Communication Communication Communication: No apparent difficulties  Cognition Arousal: Alert Behavior During Therapy: Flat affect   PT - Cognitive impairments: Orientation   Orientation impairments: Place, Time, Situation                   PT - Cognition Comments: AxO x 1 pleasant and cooperative.  Following all VC's with increased time.  Slow to move  initially. Following commands: Impaired Following commands impaired: Follows one step commands with increased time    Cueing Cueing Techniques: Verbal cues, Gestural cues, Tactile cues  Exercises      General Comments        Pertinent Vitals/Pain Pain Assessment Pain Assessment: No/denies pain    Home Living                           Prior Function            PT Goals (current goals can now be found in the care plan section) Progress towards PT goals: Progressing toward goals    Frequency    Min 2X/week      PT Plan      Co-evaluation              AM-PAC PT 6 Clicks Mobility   Outcome Measure  Help needed turning from your back to your side while in a flat bed without using bedrails?: A Little Help needed moving from lying on your back to sitting on the side of a flat bed without using bedrails?: A Little Help needed moving to and from a bed to a chair (including a wheelchair)?: A Little Help needed standing up from a chair using your arms (e.g., wheelchair or bedside chair)?: A Little Help needed to walk in hospital room?: A Little Help needed climbing 3-5 steps with a railing? : A Lot 6 Click Score: 17    End of Session Equipment Utilized During Treatment: Gait belt Activity Tolerance: Patient tolerated treatment well Patient left: in chair;with call bell/phone within reach;with chair alarm set;Other (comment) Nurse Communication: Mobility status PT Visit Diagnosis: History of falling (Z91.81);Unsteadiness on feet (R26.81);Other abnormalities of gait and mobility (R26.89);Muscle weakness (generalized) (M62.81);Repeated falls (R29.6)     Time: 8499-8478 PT Time Calculation (min) (ACUTE ONLY): 21 min  Charges:    $Gait Training: 8-22 mins PT General Charges $$ ACUTE PT VISIT: 1 Visit                    [name] PT Acute Rehabilitation Services Office [phone]  Potters [name] 04/12/2024, 3:29 PM

## 2024-04-12 NOTE — TOC Progression Note (Addendum)
 Transition of Care [facility]) - Progression Note    Patient Details  Name: [name] [name] MRN: [id number] Date of Birth: 07/10/1965  [facility] CM/SW Contact  Doneta Glenys DASEN, RN Phone Number: 04/12/2024, 8:47 AM  Clinical Narrative:    CM called APS She[facility] [phone], left message. CM spoke with [facility] Kate Sharper and she is going to send a list of SNF that are willing to accept hard to place individuals. CM submitted requested documents for PASRR Level 2. Awaiting results. CM called [facility] -can't accept unless patient is deemed disable.  APS Shefi requested an updated FL2. CM sent via email [email or web address]   Barriers to Discharge: SNF Pending bed offer               Expected Discharge Plan and Services                                               Social Drivers of Health (SDOH) Interventions SDOH Screenings   Food Insecurity: Patient Unable To Answer (03/07/2024)  Housing: Unknown (03/07/2024)  Transportation Needs: Patient Unable To Answer (03/07/2024)  Utilities: Patient Unable To Answer (03/07/2024)  Depression (PHQ2-9): High Risk (01/26/2024)  Social Connections: Unknown (09/22/2021)   Received from [facility]  Tobacco Use: Low Risk  (03/13/2024)    Readmission Risk Interventions     No data to display

## 2024-04-12 NOTE — Plan of Care (Signed)
  Problem: Clinical Measurements: Goal: Respiratory complications will improve Outcome: Progressing Goal: Cardiovascular complication will be avoided Outcome: Progressing   Problem: Pain Managment: Goal: General experience of comfort will improve and/or be controlled Outcome: Progressing

## 2024-04-12 NOTE — [place] FL2 (Signed)
 [place]  MEDICAID [id number] LEVEL OF CARE FORM     IDENTIFICATION  Patient Name: [name] [name] Birthdate: 31-Dec-1965 Sex: male Admission Date (Current Location): 03/02/2024  [facility] and [place] Number:  Producer, Television/film/video and Address:  [facility],  [address] [place] [address]      Provider Number: [id number]  Attending Physician Name and Address:  Kathrin Mignon DASEN, MD  Relative Name and Phone Number:       Current Level of Care: Hospital Recommended Level of Care: Assisted Living Facility, Other (Comment) (Group Home) Prior Approval Number:    Date Approved/Denied:   PASRR Number: [id number] E Expired 03/31/2024  Discharge Plan: Other (Comment) (Assisted Living / Group Home)    Current Diagnoses: Patient Active Problem List   Diagnosis Date Noted   Goals of care, counseling/discussion 03/27/2024   Counseling and coordination of care 03/27/2024   Palliative care encounter 03/27/2024   Acute delirium 03/08/2024   Generalized weakness 03/08/2024   Bipolar 1 disorder (HCC) 03/08/2024   Acute encephalopathy 03/07/2024   Tardive dyskinesia 02/29/2024   Physical exam, annual 12/08/2023   Chronic low back pain without sciatica 05/10/2023   Herpes simplex antibody positive 01/13/2023   Type 2 diabetes mellitus with hyperglycemia, without long-term current use of insulin  (HCC) 09/01/2022   Essential hypertension 09/01/2022   Mixed hyperlipidemia 09/01/2022   Tremor 09/01/2022   Bipolar I disorder, most recent episode depressed (HCC) 10/21/2021   Generalized anxiety disorder 01/23/2020   Moderate episode of recurrent major depressive disorder (HCC) 01/23/2020   Primary osteoarthritis of right knee 10/31/2019   MDD (major depressive disorder) 10/24/2019   PTSD (post-traumatic stress disorder) 10/24/2019    Orientation RESPIRATION BLADDER Height & Weight     Self, Situation, Place  Normal Continent Weight: 83 kg Height:  5' 10 (177.8 cm)  BEHAVIORAL  SYMPTOMS/MOOD NEUROLOGICAL BOWEL NUTRITION STATUS    Petit mal, Frequency (Rare) Continent Diet  AMBULATORY STATUS COMMUNICATION OF NEEDS Skin   Extensive Assist Verbally Normal                       Personal Care Assistance Level of Assistance  Bathing, Feeding, Dressing Bathing Assistance: Limited assistance Feeding assistance: Independent Dressing Assistance: Limited assistance     Functional Limitations Info  Sight, Hearing, Speech Sight Info: Adequate Hearing Info: Adequate Speech Info: Adequate    SPECIAL CARE FACTORS FREQUENCY  PT (By licensed PT), OT (By licensed OT)     PT Frequency: 5x weekly OT Frequency: 5x Weekly            Contractures Contractures Info: Not present    Additional Factors Info  Code Status Code Status Info: DNR - Limited Allergies Info: NKDA Psychotropic Info: Deparkote, Cymbalta , Atarax , Desyrel          Current Medications (04/12/2024):  This is the current hospital active medication list Current Facility-Administered Medications  Medication Dose Route Frequency Provider Last Rate Last Admin   acetaminophen  (TYLENOL ) tablet 650 mg  650 mg Oral Q6H PRN Amponsah, Prosper M, MD   650 mg at 03/30/24 2123   Or   acetaminophen  (TYLENOL ) suppository 650 mg  650 mg Rectal Q6H PRN Lou Claretta HERO, MD       amLODipine  (NORVASC ) tablet 5 mg  5 mg Oral Daily Gonfa, Taye T, MD   5 mg at 04/12/24 9166   artificial tears ophthalmic solution 1 drop  1 drop Both Eyes PRN Regalado, Owen LABOR, MD  atorvastatin  (LIPITOR) tablet 20 mg  20 mg Oral Daily Mannie Pac T, DO   20 mg at 04/12/24 9166   benzocaine  (ORAJEL) 10 % mucosal gel   Mouth/Throat QID PRN Raenelle Coria, MD   Given at 03/18/24 2210   bisacodyl  (DULCOLAX) EC tablet 5 mg  5 mg Oral Daily PRN Amponsah, Prosper M, MD   5 mg at 03/22/24 2100   divalproex  (DEPAKOTE ) DR tablet 625 mg  625 mg Oral Q8H Wilkie Majel [name], [name]   625 mg at 04/12/24 9487   DULoxetine  (CYMBALTA )  DR capsule 60 mg  60 mg Oral BID Starkes-Perry, Takia S, [name]   60 mg at 04/12/24 9166   enoxaparin  (LOVENOX ) injection 40 mg  40 mg Subcutaneous Q24H Amponsah, Prosper M, MD   40 mg at 04/12/24 9166   feeding supplement (ENSURE PLUS HIGH PROTEIN) liquid 237 mL  237 mL Oral BID BM Gonfa, Taye T, MD   237 mL at 04/06/24 1131   haloperidol  (HALDOL ) tablet 5 mg  5 mg Oral Q8H PRN Jacquetta Sharlot GRADE, NP       Or   haloperidol  lactate (HALDOL ) injection 5 mg  5 mg Intravenous Q8H PRN Jacquetta Sharlot GRADE, NP       hydrOXYzine  (ATARAX ) tablet 50 mg  50 mg Oral QHS Regalado, Belkys A, MD   50 mg at 04/11/24 2113   lip balm (CARMEX) ointment   Topical PRN Regalado, Belkys A, MD   1 Application at 04/11/24 1737   metFORMIN  (GLUCOPHAGE ) tablet 500 mg  500 mg Oral BID WC Gonfa, Taye T, MD   500 mg at 04/12/24 9166   multivitamin with minerals tablet 1 tablet  1 tablet Oral Daily Gonfa, Taye T, MD   1 tablet at 04/12/24 9166   ondansetron  (ZOFRAN ) tablet 4 mg  4 mg Oral Q6H PRN Amponsah, Prosper M, MD       Or   ondansetron  (ZOFRAN ) injection 4 mg  4 mg Intravenous Q6H PRN Amponsah, Prosper M, MD       Oral care mouth rinse  15 mL Mouth Rinse PRN Gonfa, Taye T, MD       propranolol  (INDERAL ) tablet 10 mg  10 mg Oral BID Gonfa, Taye T, MD   10 mg at 04/12/24 9166   senna-docusate (Senokot-S) tablet 1 tablet  1 tablet Oral QHS PRN Lou Claretta HERO, MD       traZODone  (DESYREL ) tablet 50 mg  50 mg Oral QHS PRN Jacquetta Sharlot GRADE, NP   50 mg at 04/11/24 2113   valbenazine  (INGREZZA ) capsule 80 mg  80 mg Oral Daily Mannie Pac T, DO   80 mg at 04/12/24 9166     Discharge Medications: Please see discharge summary for a list of discharge medications.  Relevant Imaging Results:  Relevant Lab Results:   Additional Information SSN [id number]  Doneta Glenys DASEN, RN

## 2024-04-12 NOTE — Progress Notes (Signed)
 PROGRESS NOTE  [name] [name] FMW:[id number] DOB: 1965/06/15   PCP: [name], [name]  Patient is from: Home.  Lives with friend, [name].  No immediate family members.  Only one estranged brother that does not want to talk to him.  DOA: 03/02/2024 LOS: 36  Chief complaints Chief Complaint  Patient presents with   Placement     Brief Narrative / Interim history: 58 year old M with PMH of type I bipolar disorder, tardive dyskinesia, PTSD, MDD, GAD, DM-2, tremor, frequent falls and HLD brought to ED by EMS on 10/18 due to talking crazy.   Per EDP note, roommate/friend reported problems with the patient having frequent falls in the house, difficulty caring for himself, erratic behavior including today where he had a very loud outburst in front of her, calling her names, and then was overheard stating he may kill himself.  She is concerned because he has access to 2 guns in the house.     Patient was boarded in the emergency room since 10/18 10/19, psychiatry consulted as patient was endorsing suicidal thoughts-IVC 10/22, psychiatry cleared for discharge. 10/24, patient was noted to be encephalopathic so admitted to the hospital.  11/4, MRI under sedation, transient hypotension and altered mental status-overnight in ICU. 11/5, clinically improved.  Sedation wore off.  Transferred back to MedSurg bed.    Has no capacity. Two-physician DNR on 03/27/2024 after discussion with palliative medicine given patient's multiple medical comorbidity. APS involved.  Waiting on placement.   Subjective: Seen and examined earlier this morning.  No major events overnight or this morning.  Sleepy but wakes to voice.  Dismissive this morning.  Assessment and plan: Acute metabolic encephalopathy in a patient with underlying psychiatric disorder: Multifactorial including positive underlying cognitive impairment, dehydration, delirium and iatrogenic.  CT head and MRI brain without acute finding to  explain patient's behavioral issue or encephalopathy.  TSH, B12, EEG x 3 unrevealing.  Improved. -Reorientation and delirium precaution - Continue TeleSitter   Bipolar 1 disorder/MDD/GAD/PTSD: Aggression and combativeness improved.  Has been off restraints for awhile. -Appreciate psych recommendation-discontinued Cogentin  on 12/2. -Increased Cymbalta  to 60 mg twice daily on 12/2. -Continue Ingrezza  80 mg daily.   -Continue Depakote  625 mg 3 times daily.  Depakote  level 62 on 12/3. -Continue Atarax  50 mg nightly.  -Continue trazodone  and Haldol  as needed.   Medical decision making capacity: Oriented to self and place for most part but not time.  Has no insight into his medical condition and treatment options.  Evaluated by psychiatry on 11/23.  He did not demonstrate capacity to make his own medical decision.  APS involved.  Hypotension 2/2 sedation: Resolved. Essential hypertension: Normotensive. -Continue low-dose amlodipine . -Continue home Inderal -decreased to twice daily given borderline heart rate. -Continue holding lisinopril  due to risk for AKI.  NIDDM-2: A1c 6.3%.  CBG within acceptable range.  CBG monitoring discontinued. -Continue low-dose metformin .   Physical deconditioning/recurrent falls  -Therapy recommended SNF.   Hypoxemia: Resolved.  On room air.  Hyperlipidemia - Continue statin.   Vitamin D  deficiency: -Continue vitamin D  supplementation  Dehydration: dry and chapped lips - IV fluid bolus intermittently.  Goal of care: Poor long-term prognosis due to underlying complex mental health and very limited social support.  He has not demonstrated capacity to make his own medical decisions so far.  Has no immediate family member.  Reportedly, only one estranged brother that does not want to talk to him. Lived with friend, [name] before this admission. Two person DNR after  discussion with palliative medicine on 11/18. - APS involved for guardianship.  Increased  nutrient needs Body mass index is 26.26 kg/m. Nutrition Problem: Increased nutrient needs Etiology: acute illness Signs/Symptoms: estimated needs Interventions: Ensure Enlive (each supplement provides 350kcal and 20 grams of protein), MVI   DVT prophylaxis:  enoxaparin  (LOVENOX ) injection 40 mg Start: 03/08/24 0800  Code Status: DNR. Family Communication: None at bedside Level of care: Med-Surg Status is: Inpatient Remains inpatient appropriate because: Waiting on APS guardianship and SNF bed   Final disposition: SNF   35 minutes with more than 50% spent in reviewing records, counseling patient/family and coordinating care.  Consultants:  Psychiatry Palliative medicine  Procedures: None  Microbiology summarized: MRSA PCR screen positive.  Objective: Vitals:   04/11/24 1400 04/12/24 0628 04/12/24 0634 04/12/24 0749  BP: 118/72 (!) 157/128 102/78   Pulse: 64 (!) 57 (!) 55 65  Resp: 16     Temp: 97.7 F (36.5 C)     TempSrc: Oral     SpO2: 94% 96%    Weight:      Height:        Examination:  GENERAL: No apparent distress.  Nontoxic. HEENT: Chapped and dry lips.  Vision and hearing grossly intact.  NECK: Supple.  No apparent JVD.  RESP:  No IWOB.  On room air. CVS:  RRR. Heart sounds normal.  ABD/GI/GU: BS+. Abd soft, NTND.  MSK/EXT:  Moves extremities.  SKIN: no apparent skin lesion or wound NEURO: Sleepy but wakes to voice.  Awake and oriented to self, place and date of birth.  PSYCH: Calm.  No agitation or distress.  Sch Meds:  Scheduled Meds:  amLODipine   5 mg Oral Daily   atorvastatin   20 mg Oral Daily   divalproex   625 mg Oral Q8H   DULoxetine   60 mg Oral BID   enoxaparin  (LOVENOX ) injection  40 mg Subcutaneous Q24H   feeding supplement  237 mL Oral BID BM   hydrOXYzine   50 mg Oral QHS   metFORMIN   500 mg Oral BID WC   multivitamin with minerals  1 tablet Oral Daily   propranolol   10 mg Oral BID   valbenazine   80 mg Oral Daily   Continuous  Infusions:   PRN Meds:.acetaminophen  **OR** acetaminophen , artificial tears, benzocaine , bisacodyl , haloperidol  **OR** haloperidol  lactate, lip balm, ondansetron  **OR** ondansetron  (ZOFRAN ) IV, mouth rinse, senna-docusate, traZODone   Antimicrobials: Anti-infectives (From admission, onward)    Start     Dose/Rate Route Frequency Ordered Stop   03/12/24 1515  amoxicillin -clavulanate (AUGMENTIN ) 875-125 MG per tablet 1 tablet        1 tablet Oral Every 12 hours 03/12/24 1420 03/17/24 0959   03/12/24 1515  doxycycline  (VIBRA -TABS) tablet 100 mg  Status:  Discontinued        100 mg Oral Every 12 hours 03/12/24 1420 03/15/24 1100        I have personally reviewed the following labs and images: CBC: No results for input(s): WBC, NEUTROABS, HGB, HCT, MCV, PLT in the last 168 hours.  BMP &GFR No results for input(s): NA, K, CL, CO2, GLUCOSE, BUN, CREATININE, CALCIUM , MG, PHOS in the last 168 hours.  Invalid input(s): GFRCG  Estimated Creatinine Clearance: 85.5 mL/min (by C-G formula based on SCr of 0.96 mg/dL). Liver & Pancreas: No results for input(s): AST, ALT, ALKPHOS, BILITOT, PROT, ALBUMIN  in the last 168 hours.  No results for input(s): LIPASE, AMYLASE in the last 168 hours. No results for input(s): AMMONIA in the last  168 hours. Diabetic: No results for input(s): HGBA1C in the last 72 hours. No results for input(s): GLUCAP in the last 168 hours.  Cardiac Enzymes: No results for input(s): CKTOTAL, CKMB, CKMBINDEX, TROPONINI in the last 168 hours.  No results for input(s): PROBNP in the last 8760 hours. Coagulation Profile: No results for input(s): INR, PROTIME in the last 168 hours. Thyroid  Function Tests: No results for input(s): TSH, T4TOTAL, FREET4, T3FREE, THYROIDAB in the last 72 hours. Lipid Profile: No results for input(s): CHOL, HDL, LDLCALC, TRIG, CHOLHDL, LDLDIRECT in the  last 72 hours. Anemia Panel: No results for input(s): VITAMINB12, FOLATE, FERRITIN, TIBC, IRON, RETICCTPCT in the last 72 hours. Urine analysis:    Component Value Date/Time   COLORURINE YELLOW 03/07/2024 2022   APPEARANCEUR CLEAR 03/07/2024 2022   LABSPEC 1.024 03/07/2024 2022   PHURINE 6.0 03/07/2024 2022   GLUCOSEU NEGATIVE 03/07/2024 2022   HGBUR NEGATIVE 03/07/2024 2022   BILIRUBINUR NEGATIVE 03/07/2024 2022   KETONESUR 5 (A) 03/07/2024 2022   PROTEINUR NEGATIVE 03/07/2024 2022   UROBILINOGEN 1.0 10/24/2008 1155   NITRITE NEGATIVE 03/07/2024 2022   LEUKOCYTESUR TRACE (A) 03/07/2024 2022   Sepsis Labs: Invalid input(s): PROCALCITONIN, LACTICIDVEN  Microbiology: No results found for this or any previous visit (from the past 240 hours).   Radiology Studies: No results found.    Lerline Valdivia T. Ashaunte Standley [facility]  If 7PM-7AM, please contact night-coverage [email or web address] 04/12/2024, 11:09 AM

## 2024-04-13 DIAGNOSIS — R410 Disorientation, unspecified: Secondary | ICD-10-CM | POA: Diagnosis not present

## 2024-04-13 DIAGNOSIS — F319 Bipolar disorder, unspecified: Secondary | ICD-10-CM | POA: Diagnosis not present

## 2024-04-13 DIAGNOSIS — G9340 Encephalopathy, unspecified: Secondary | ICD-10-CM | POA: Diagnosis not present

## 2024-04-13 DIAGNOSIS — Z7189 Other specified counseling: Secondary | ICD-10-CM | POA: Diagnosis not present

## 2024-04-13 NOTE — Progress Notes (Signed)
 Occupational Therapy Treatment Patient Details Name: [name] [name] MRN: [id number] DOB: 1965/09/12 Today's Date: 04/13/2024   History of present illness Pt is a 58 yr old male presenting on 10/24 for fall and chronic back pain. Admitted from ED with acute metabolic encephalopathy, chest x ray with hypoventilation. PMH includes: bipolar disorder type 1, tardive dyskinesia, PTSD, MDD, CAD, DM2, OA, tremors.   OT comments  The pt was seen for functional strengthening, functional transfers, and progression of functional activity, as such is needed to maximize his ADL performance. He required min assist for bed mobility and to stand from EOB using a RW. He was noted to demo posterior lean in standing, requiring min assist and cues to correct. He subsequently required assistance for short-distance ambulation using a RW. He needed intermittent cues to avoid environmental obstacles (pt reports history of vision impairment). He gently declined to participate in self-care tasks/ADLs today. He requested to return to bed. Continue OT plan of care. Patient will benefit from continued inpatient follow up therapy, <3 hours/day.       If plan is discharge home, recommend the following:  A little help with walking and/or transfers;A lot of help with bathing/dressing/bathroom;Assistance with cooking/housework;Direct supervision/assist for medications management;Direct supervision/assist for financial management;Assist for transportation;Help with stairs or ramp for entrance   Equipment Recommendations  BSC/3in1    Recommendations for Other Services      Precautions / Restrictions Precautions Precautions: Fall Restrictions Weight Bearing Restrictions Per Provider Order: No Other Position/Activity Restrictions: chronic back pain, history of Parkinson's disease       Mobility Bed Mobility Overal bed mobility: Needs Assistance Bed Mobility: Supine to Sit     Supine to sit: HOB elevated, Used rails,  Min assist Sit to supine: Min assist        Transfers Overall transfer level: Needs assistance Equipment used: Rolling walker (2 wheels) Transfers: Sit to/from Stand Sit to Stand: Min assist, From elevated surface           General transfer comment: required assist and cues to cprrect posterior bias in standing     Balance     Sitting balance-Leahy Scale: Fair         Standing balance comment: CGA to min assist, posterior bias            ADL either performed or assessed with clinical judgement   ADL Overall ADL's : Needs assistance/impaired        General ADL Comments: The pt gently declined to attempt self-care tasks today.     Vision Baseline Vision/History: 1 Wears glasses Vision Assessment?: Vision impaired- to be further tested in functional context   Perception            Cognition Arousal: Alert Behavior During Therapy: Flat affect, WFL for tasks assessed/performed Cognition: Cognition impaired   Orientation impairments: Time, Situation Awareness: Online awareness impaired Memory impairment (select all impairments): Declarative long-term memory, Working memory, Short-term memory Attention impairment (select first level of impairment): Divided attention, Sustained attention Executive functioning impairment (select all impairments): Reasoning, Problem solving                   Following commands: Impaired Following commands impaired: Follows one step commands with increased time      Cueing   Cueing Techniques: Verbal cues, Gestural cues, Tactile cues  Exercises              Pertinent Vitals/ Pain       Pain Assessment Pain Assessment:  No/denies pain   Frequency  Min 2X/week        Progress Toward Goals  OT Goals(current goals can now be found in the care plan section)  Progress towards OT goals: Progressing toward goals  Acute Rehab OT Goals Patient Stated Goal: to go home OT Goal Formulation: With patient Time  For Goal Achievement: 04/22/24 Potential to Achieve Goals: Good  Plan         AM-PAC OT 6 Clicks Daily Activity     Outcome Measure   Help from another person eating meals?: A Little Help from another person taking care of personal grooming?: A Little Help from another person toileting, which includes using toliet, bedpan, or urinal?: A Little Help from another person bathing (including washing, rinsing, drying)?: A Lot Help from another person to put on and taking off regular upper body clothing?: A Little Help from another person to put on and taking off regular lower body clothing?: A Lot 6 Click Score: 16    End of Session Equipment Utilized During Treatment: Gait belt;Rolling walker (2 wheels)  OT Visit Diagnosis: Other abnormalities of gait and mobility (R26.89);Unsteadiness on feet (R26.81);Muscle weakness (generalized) (M62.81);History of falling (Z91.81);Other symptoms and signs involving cognitive function   Activity Tolerance Patient tolerated treatment well   Patient Left in bed;with call bell/phone within reach;with bed alarm set   Nurse Communication Mobility status        Time: 8441-8387 OT Time Calculation (min): 14 min  Charges: OT General Charges $OT Visit: 1 Visit OT Treatments $Therapeutic Activity: 8-22 mins     [name], OTR/L 04/13/2024, 5:03 PM

## 2024-04-13 NOTE — Plan of Care (Signed)
   Problem: Health Behavior/Discharge Planning: Goal: Ability to manage health-related needs will improve Outcome: Progressing   Problem: Clinical Measurements: Goal: Ability to maintain clinical measurements within normal limits will improve Outcome: Progressing Goal: Will remain free from infection Outcome: Progressing Goal: Diagnostic test results will improve Outcome: Progressing Goal: Respiratory complications will improve Outcome: Progressing

## 2024-04-13 NOTE — Progress Notes (Signed)
 [name] NOTE    COURT GRACIA  FMW:[id number] DOB: Jun [name], [name] DOA: 03/02/2024 PCP: [name], [name]    Chief Complaint  Patient presents with   Placement    Brief Narrative:  58 year old M with PMH of type I bipolar disorder, tardive dyskinesia, PTSD, MDD, GAD, DM-2, tremor, frequent falls and HLD brought to ED by EMS on 10/18 due to talking crazy.    Per EDP note, roommate/friend reported problems with the patient having frequent falls in the house, difficulty caring for himself, erratic behavior including today where he had a very loud outburst in front of her, calling her names, and then was overheard stating he may kill himself.  She is concerned because he has access to 2 guns in the house.     Patient was boarded in the emergency room since 10/18 10/19, psychiatry consulted as patient was endorsing suicidal thoughts-IVC 10/[name], psychiatry cleared for discharge. 10/24, patient was noted to be encephalopathic so admitted to the hospital.  11/4, MRI under sedation, transient hypotension and altered mental status-overnight in ICU. 11/5, clinically improved.  Sedation wore off.  Transferred back to MedSurg bed.     Has no capacity. Two-physician DNR on 03/27/2024 after discussion with palliative medicine given patient's multiple medical comorbidity. APS involved.  Waiting on placement.   Assessment & Plan:   Principal Problem:   Acute encephalopathy Active Problems:   Chronic low back pain without sciatica   Acute delirium   Generalized weakness   Bipolar 1 disorder (HCC)   Goals of care, counseling/discussion   Counseling and coordination of care   Palliative care encounter  #1 acute metabolic encephalopathy in patient with underlying psychiatric disorder - Felt to be multifactorial including positive underlying cognitive impairment, dehydration, delirium and iatrogenic. - Imaging of the head including CTA and MRI brain without any acute findings to explain  patient's behavioral issue encephalopathy. - EEG done x 3 negative for any epileptiform activity or seizure-like activity. - TSH, vitamin B12 within normal limits. - Patient with clinical improvement. - Delirium precautions. - Continue TeleSitter.  2.  Bipolar disorder/MDD/GAD/PTSD -Patient noted to have some aggression and combativeness early on during the hospitalization which has subsequently improved. - Currently off restraints. - Patient seen in consultation by psychiatry. - Cogentin  discontinued on 12/2. - Cymbalta  increased to 60 mg twice daily on 12/2. - Continue Ingrezza  80 mg daily. - Continue Depakote  625 mg 3 times daily. - Depakote  level on 12/3 noted at 62. - Continue Atarax  50 mg nightly. - Continue trazodone  and Haldol  as needed. - Appreciate psychiatry input and recommendations.  3.  Hypotension secondary to sedation -Resolved.  4.  Hypertension -BP soft this morning. - Continue Norvasc  5 mg daily. - Continue propranolol  10 mg twice daily which was decreased to twice daily due to borderline heart rate. - Lisinopril  on hold due to risk of AKI. - If BP continues to remain soft may need to consider holding Norvasc .  5.  Non-insulin -dependent diabetes mellitus type 2 -Hemoglobin A1c 6.3%. - Glucose from B med on 11/[name]/2025 at 86. - Continue metformin . - Outpatient follow-up.  6.  Hyperlipidemia -Continue statin.  7.  Hypoxemia -Resolved.  8.  Medical decision making capacity -Patient oriented to self and place for the most part but not time. - Patient noted with no insight into his medical condition and treatment options. - Patient evaluated by psychiatry on 11/23. - Patient does not demonstrate capacity to make his own medical decisions. - TOC consulted. - APS  involved.  9.  Vitamin D  deficiency -Continue vitamin D  supplementation.  10.  Dehydration -Patient receiving intermittent IV fluid boluses.  11.  Goals of care -Patient with poor long-term  prognosis due to underlying complex mental health and very limited social support. - Patient does not demonstrate capacity to make his own medical decisions overall. - Patient with no immediate family member. - Patient reportedly with only 1 estranged brother that does not want to talk to patient. - Patient noted to have lived with her friend, Sari before admission. - To presume DNR was done after discussion with palliative care medicine on 03/27/2024. - APS currently involved for guardianship.  12.  Increased nutritional needs -BMI of 26.26 kg/m. - Continue nutritional supplementation.   DVT prophylaxis: Lovenox  Code Status: DNR Family Communication: No family at bedside.  Awaiting APS guardianship. Disposition: Awaiting on APS guardianship and SNF.  Status is: Inpatient Remains inpatient appropriate because: Unsafe disposition   Consultants:  Neurology PCCM Psychiatry Palliative care  Procedures:  CT head 03/07/2024, 03/17/2024, 03/23/2024 CT C-spine 03/23/2024 Plain films of the right elbow 03/17/2024 Abdominal films 03/12/2024 Chest x-ray 03/08/2024 MRI brain 03/13/2024 EEG 03/10/2024  Antimicrobials:  Anti-infectives (From admission, onward)    Start     Dose/Rate Route Frequency Ordered Stop   03/12/24 1515  amoxicillin -clavulanate (AUGMENTIN ) 875-125 MG per tablet 1 tablet        1 tablet Oral Every 12 hours 03/12/24 1420 03/17/24 0959   03/12/24 1515  doxycycline  (VIBRA -TABS) tablet 100 mg  Status:  Discontinued        100 mg Oral Every 12 hours 03/12/24 1420 03/15/24 1100         Subjective: Patient lying in bed.  Denies any chest pain or shortness of breath no abdominal pain.  States he is doing well.  Tolerating current diet.  Objective: Vitals:   04/12/24 2009 04/13/24 0517 04/13/24 0946 04/13/24 1218  BP: 128/77 104/67 116/73 100/67  Pulse: 63 60 66 60  Resp: 18 18  14   Temp: 98.2 F (36.8 C) 97.6 F (36.4 C)    TempSrc:  Oral    SpO2: 98% 93%   97%  Weight:      Height:        Intake/Output Summary (Last 24 hours) at 04/13/2024 1619 Last data filed at 04/13/2024 0800 Gross per 24 hour  Intake 360 ml  Output --  Net 360 ml   Filed Weights   03/07/24 2229 03/26/24 1715  Weight: 87.4 kg 83 kg    Examination:  General exam: Appears calm and comfortable  Respiratory system: Clear to auscultation anterior lung fields. Respiratory effort normal. Cardiovascular system: S1 & S2 heard, RRR. No JVD, murmurs, rubs, gallops or clicks. No pedal edema. Gastrointestinal system: Abdomen is nondistended, soft and nontender. No organomegaly or masses felt. Normal bowel sounds heard. Central nervous system: Alert. No focal neurological deficits. Extremities: Symmetric 5 x 5 power. Skin: No rashes, lesions or ulcers Psychiatry: Judgement and insight appear poor to fair. Mood & affect appropriate.     Data Reviewed: I have personally reviewed following labs and imaging studies  CBC: No results for input(s): WBC, NEUTROABS, HGB, HCT, MCV, PLT in the last 168 hours.  Basic Metabolic Panel: No results for input(s): NA, K, CL, CO2, GLUCOSE, BUN, CREATININE, CALCIUM , MG, PHOS in the last 168 hours.  GFR: Estimated Creatinine Clearance: 85.5 mL/min (by C-G formula based on SCr of 0.96 mg/dL).  Liver Function Tests: No results for input(s): AST,  ALT, ALKPHOS, BILITOT, PROT, ALBUMIN  in the last 168 hours.  CBG: No results for input(s): GLUCAP in the last 168 hours.   No results found for this or any previous visit (from the past 240 hours).       Radiology Studies: No results found.      Scheduled Meds:  amLODipine   5 mg Oral Daily   atorvastatin   20 mg Oral Daily   divalproex   625 mg Oral Q8H   DULoxetine   60 mg Oral BID   enoxaparin  (LOVENOX ) injection  40 mg Subcutaneous Q24H   feeding supplement  237 mL Oral BID BM   hydrOXYzine   50 mg Oral QHS   metFORMIN   500 mg Oral  BID WC   multivitamin with minerals  1 tablet Oral Daily   propranolol   10 mg Oral BID   valbenazine   80 mg Oral Daily   Continuous Infusions:   LOS: 37 days    Time spent: 35 minutes    Toribio Hummer, MD [facility]   To contact the attending provider between 7A-7P or the covering provider during after hours 7P-7A, please log into the web site [email or web address] and access using universal [place] password for that web site. If you do not have the password, please call the hospital operator.  04/13/2024, 4:19 PM

## 2024-04-13 NOTE — Plan of Care (Signed)

## 2024-04-14 DIAGNOSIS — F319 Bipolar disorder, unspecified: Secondary | ICD-10-CM | POA: Diagnosis not present

## 2024-04-14 DIAGNOSIS — R410 Disorientation, unspecified: Secondary | ICD-10-CM | POA: Diagnosis not present

## 2024-04-14 DIAGNOSIS — G9340 Encephalopathy, unspecified: Secondary | ICD-10-CM | POA: Diagnosis not present

## 2024-04-14 DIAGNOSIS — Z7189 Other specified counseling: Secondary | ICD-10-CM | POA: Diagnosis not present

## 2024-04-14 MED ORDER — ACETAMINOPHEN 10 MG/ML IV SOLN
INTRAVENOUS | Status: AC
  Filled 2024-04-14: qty 100

## 2024-04-14 MED ORDER — ENOXAPARIN SODIUM 40 MG/0.4ML IJ SOSY
PREFILLED_SYRINGE | INTRAMUSCULAR | Status: AC
  Filled 2024-04-14: qty 0.4

## 2024-04-14 NOTE — Plan of Care (Signed)
   Problem: Health Behavior/Discharge Planning: Goal: Ability to manage health-related needs will improve Outcome: Progressing   Problem: Clinical Measurements: Goal: Will remain free from infection Outcome: Progressing Goal: Diagnostic test results will improve Outcome: Progressing

## 2024-04-14 NOTE — Progress Notes (Signed)
 PROGRESS NOTE    [name] [name]  FMW:[id number] DOB: 01-Feb-1966 DOA: 03/02/2024 PCP: [name], [name]    Chief Complaint  Patient presents with   Placement    Brief Narrative:  58 year old M with PMH of type I bipolar disorder, tardive dyskinesia, PTSD, MDD, GAD, DM-2, tremor, frequent falls and HLD brought to ED by EMS on 10/18 due to talking crazy.    Per EDP note, roommate/friend reported problems with the patient having frequent falls in the house, difficulty caring for himself, erratic behavior including today where he had a very loud outburst in front of her, calling her names, and then was overheard stating he may kill himself.  She is concerned because he has access to 2 guns in the house.     Patient was boarded in the emergency room since 10/18 10/19, psychiatry consulted as patient was endorsing suicidal thoughts-IVC 10/22, psychiatry cleared for discharge. 10/24, patient was noted to be encephalopathic so admitted to the hospital.  11/4, MRI under sedation, transient hypotension and altered mental status-overnight in ICU. 11/5, clinically improved.  Sedation wore off.  Transferred back to MedSurg bed.     Has no capacity. Two-physician DNR on 03/27/2024 after discussion with palliative medicine given patient's multiple medical comorbidity. APS involved.  Waiting on placement.   Assessment & Plan:   Principal Problem:   Acute encephalopathy Active Problems:   Chronic low back pain without sciatica   Acute delirium   Generalized weakness   Bipolar 1 disorder (HCC)   Goals of care, counseling/discussion   Counseling and coordination of care   Palliative care encounter  #1 acute metabolic encephalopathy in patient with underlying psychiatric disorder - Felt to be multifactorial including positive underlying cognitive impairment, dehydration, delirium and iatrogenic. - Imaging of the head including CTA and MRI brain without any acute findings to explain  patient's behavioral issue encephalopathy. - EEG done x 3 negative for any epileptiform activity or seizure-like activity. - TSH, vitamin B12 within normal limits. - Patient with clinical improvement. - Delirium precautions. - Continue TeleSitter.  2.  Bipolar disorder/MDD/GAD/PTSD -Patient noted to have some aggression and combativeness early on during the hospitalization which has subsequently improved. - Currently off restraints. - Patient seen in consultation by psychiatry. - Cogentin  discontinued on 12/2. - Cymbalta  increased to 60 mg twice daily on 12/2. - Continue Ingrezza  80 mg daily. - Continue Depakote  625 mg 3 times daily. - Depakote  level on 12/3 noted at 62. - Continue Atarax  50 mg nightly. - Continue trazodone  and Haldol  as needed. - Appreciate psychiatry input and recommendations.  3.  Hypotension secondary to sedation -Resolved.  4.  Hypertension -BP soft this morning. - Discontinue Norvasc .  - Continue propranolol  10 mg twice daily which was decreased to twice daily due to borderline heart rate. - Lisinopril  on hold due to risk of AKI.  5.  Non-insulin -dependent diabetes mellitus type 2 -Hemoglobin A1c 6.3%. - Glucose from [facility] on 03/31/2024 at 86. - Continue metformin . - Outpatient follow-up.  6.  Hyperlipidemia - Statin.   7.  Hypoxemia -Resolved.  8.  Medical decision making capacity -Patient oriented to self and place for the most part but not time. - Patient noted with no insight into his medical condition and treatment options. - Patient evaluated by psychiatry on 11/23. - Patient does not demonstrate capacity to make his own medical decisions. - TOC consulted. - APS involved.  9.  Vitamin D  deficiency -Continue vitamin D  supplementation.  10.  Dehydration -  Intermittent IV fluid boluses.   11.  Goals of care -Patient with poor long-term prognosis due to underlying complex mental health and very limited social support. - Patient does not  demonstrate capacity to make his own medical decisions overall. - Patient with no immediate family member. - Patient reportedly with only 1 estranged brother that does not want to talk to patient. - Patient noted to have lived with her friend, Sari before admission. - To presume DNR was done after discussion with palliative care medicine on 03/27/2024. - APS currently involved for guardianship.  12.  Increased nutritional needs -BMI of 26.26 kg/m. - Continue nutritional supplementation.   DVT prophylaxis: Lovenox  Code Status: DNR Family Communication: No family at bedside.  Awaiting APS guardianship. Disposition: Awaiting on APS guardianship and SNF.  Status is: Inpatient Remains inpatient appropriate because: Unsafe disposition   Consultants:  Neurology PCCM Psychiatry Palliative care  Procedures:  CT head 03/07/2024, 03/17/2024, 03/23/2024 CT C-spine 03/23/2024 Plain films of the right elbow 03/17/2024 Abdominal films 03/12/2024 Chest x-ray 03/08/2024 MRI brain 03/13/2024 EEG 03/10/2024  Antimicrobials:  Anti-infectives (From admission, onward)    Start     Dose/Rate Route Frequency Ordered Stop   03/12/24 1515  amoxicillin -clavulanate (AUGMENTIN ) 875-125 MG per tablet 1 tablet        1 tablet Oral Every 12 hours 03/12/24 1420 03/17/24 0959   03/12/24 1515  doxycycline  (VIBRA -TABS) tablet 100 mg  Status:  Discontinued        100 mg Oral Every 12 hours 03/12/24 1420 03/15/24 1100         Subjective: Patient laying in bed.  Denies chest pain or shortness of breath.  No abdominal pain.  Tolerating diet.   Objective: Vitals:   04/14/24 0922 04/14/24 0927 04/14/24 0929 04/14/24 1153  BP: (!) 83/56 98/71 (!) 150/128 97/72  Pulse: 60 62 75 64  Resp:    18  Temp:    97.6 F (36.4 C)  TempSrc:      SpO2:    94%  Weight:      Height:        Intake/Output Summary (Last 24 hours) at 04/14/2024 1358 Last data filed at 04/14/2024 0930 Gross per 24 hour  Intake 180  ml  Output 300 ml  Net -120 ml   Filed Weights   03/07/24 2229 03/26/24 1715  Weight: 87.4 kg 83 kg    Examination:  General exam: NAD. Respiratory system: CTAB anterior lung fields.  No wheezes, no crackles, no rhonchi.  Fair air movement.  Speaking in full sentences.   Cardiovascular system: Regular rate rhythm no murmurs rubs or gallops.  No JVD.  No lower extremity edema.  Gastrointestinal system: Abdomen is soft, nontender, nondistended, positive bowel sounds.  No rebound.  No guarding.  Central nervous system: Alert.  Moving extremities spontaneously.  No focal neurological deficits. Extremities: Symmetric 5 x 5 power. Skin: No rashes, lesions or ulcers Psychiatry: Judgement and insight appear poor to fair. Mood & affect appropriate.     Data Reviewed: I have personally reviewed following labs and imaging studies  CBC: No results for input(s): WBC, NEUTROABS, HGB, HCT, MCV, PLT in the last 168 hours.  Basic Metabolic Panel: No results for input(s): NA, K, CL, CO2, GLUCOSE, BUN, CREATININE, CALCIUM , MG, PHOS in the last 168 hours.  GFR: Estimated Creatinine Clearance: 85.5 mL/min (by C-G formula based on SCr of 0.96 mg/dL).  Liver Function Tests: No results for input(s): AST, ALT, ALKPHOS, BILITOT, PROT, ALBUMIN  in  the last 168 hours.  CBG: No results for input(s): GLUCAP in the last 168 hours.   No results found for this or any previous visit (from the past 240 hours).       Radiology Studies: No results found.      Scheduled Meds:  atorvastatin   20 mg Oral Daily   divalproex   625 mg Oral Q8H   DULoxetine   60 mg Oral BID   enoxaparin        enoxaparin  (LOVENOX ) injection  40 mg Subcutaneous Q24H   feeding supplement  237 mL Oral BID BM   hydrOXYzine   50 mg Oral QHS   metFORMIN   500 mg Oral BID WC   multivitamin with minerals  1 tablet Oral Daily   propranolol   10 mg Oral BID   valbenazine   80 mg Oral  Daily   Continuous Infusions:  acetaminophen        LOS: 38 days    Time spent: 35 minutes    Toribio Hummer, MD [facility]   To contact the attending provider between 7A-7P or the covering provider during after hours 7P-7A, please log into the web site [email or web address] and access using universal [place] password for that web site. If you do not have the password, please call the hospital operator.  04/14/2024, 1:58 PM

## 2024-04-14 NOTE — Plan of Care (Signed)
  Problem: Health Behavior/Discharge Planning: Goal: Ability to manage health-related needs will improve 04/14/2024 0544 by Melvyn Melvyn NOVAK, RN Outcome: Progressing 04/14/2024 0438 by Melvyn Melvyn NOVAK, RN Outcome: Progressing   Problem: Clinical Measurements: Goal: Ability to maintain clinical measurements within normal limits will improve Outcome: Progressing Goal: Will remain free from infection 04/14/2024 0544 by Melvyn Melvyn NOVAK, RN Outcome: Progressing 04/14/2024 0438 by Melvyn Melvyn NOVAK, RN Outcome: Progressing Goal: Diagnostic test results will improve 04/14/2024 0544 by Melvyn Melvyn NOVAK, RN Outcome: Progressing 04/14/2024 0438 by Melvyn Melvyn NOVAK, RN Outcome: Progressing Goal: Respiratory complications will improve Outcome: Progressing Goal: Cardiovascular complication will be avoided Outcome: Progressing   Problem: Activity: Goal: Risk for activity intolerance will decrease Outcome: Progressing   Problem: Nutrition: Goal: Adequate nutrition will be maintained Outcome: Progressing   Problem: Coping: Goal: Level of anxiety will decrease Outcome: Progressing   Problem: Elimination: Goal: Will not experience complications related to bowel motility Outcome: Progressing Goal: Will not experience complications related to urinary retention Outcome: Progressing

## 2024-04-14 NOTE — Progress Notes (Signed)
 Mobility Specialist Progress Note:   04/14/24 1558  Mobility  Activity Ambulated with assistance  Level of Assistance Minimal assist, patient does 75% or more  Assistive Device Front wheel walker  Distance Ambulated (ft) 75 ft  Activity Response Tolerated well  Mobility Referral Yes  Mobility visit 1 Mobility  Mobility Specialist Start Time (ACUTE ONLY) 1526  Mobility Specialist Stop Time (ACUTE ONLY) 1543  Mobility Specialist Time Calculation (min) (ACUTE ONLY) 17 min   Pt was received in bed and agreed to mobility. Min A sit to stand. Returned to [facility] in room, urine voided. Upon using BSC, returned to bed with all needs met. Call bell in reach and bed alarm on.  [employer] - Mobility Specialist

## 2024-04-15 NOTE — Progress Notes (Signed)
 PROGRESS NOTE    [name] [name]  FMW:[id number] DOB: 19-Nov-1965 DOA: 03/02/2024 PCP: [name] Jeoffrey [name], [name]    Chief Complaint  Patient presents with   Placement    Brief Narrative:  58 year old M with PMH of type I bipolar disorder, tardive dyskinesia, PTSD, MDD, GAD, DM-2, tremor, frequent falls and HLD brought to ED by EMS on 10/18 due to talking crazy.    Per EDP note, roommate/friend reported problems with the patient having frequent falls in the house, difficulty caring for himself, erratic behavior including today where he had a very loud outburst in front of her, calling her names, and then was overheard stating he may kill himself.  She is concerned because he has access to 2 guns in the house.     Patient was boarded in the emergency room since 10/18 10/19, psychiatry consulted as patient was endorsing suicidal thoughts-IVC 10/22, psychiatry cleared for discharge. 10/24, patient was noted to be encephalopathic so admitted to the hospital.  11/4, MRI under sedation, transient hypotension and altered mental status-overnight in ICU. 11/5, clinically improved.  Sedation wore off.  Transferred back to MedSurg bed.     Has no capacity. Two-physician DNR on 03/27/2024 after discussion with palliative medicine given patient's multiple medical comorbidity. APS involved.  Waiting on placement.   Assessment & Plan:   Principal Problem:   Acute encephalopathy Active Problems:   Chronic low back pain without sciatica   Acute delirium   Generalized weakness   Bipolar 1 disorder (HCC)   Goals of care, counseling/discussion   Counseling and coordination of care   Palliative care encounter  #1 acute metabolic encephalopathy in patient with underlying psychiatric disorder - Felt to be multifactorial including positive underlying cognitive impairment, dehydration, delirium and iatrogenic. - Imaging of the head including CTA and MRI brain without any acute findings to explain  patient's behavioral issue encephalopathy. - EEG done x 3 negative for any epileptiform activity or seizure-like activity. - TSH, vitamin B12 within normal limits. - Patient with clinical improvement. - Delirium precautions. - Continue TeleSitter.  2.  Bipolar disorder/MDD/GAD/PTSD -Patient noted to have some aggression and combativeness early on during the hospitalization which has subsequently improved. - Currently off restraints. - Patient seen in consultation by psychiatry. - Cogentin  discontinued on 12/2. - Cymbalta  increased to 60 mg twice daily on 12/2. - Continue Ingrezza  80 mg daily. - Continue Depakote  625 mg 3 times daily. - Depakote  level on 12/3 noted at 62. - Continue Atarax  50 mg nightly. - Continue trazodone  and Haldol  as needed. - Appreciate psychiatry input and recommendations.  3.  Hypotension secondary to sedation -Resolved.  4.  Hypertension -BP soft.  - Norvasc  discontinued.  - Continue propranolol  10 mg twice daily which was decreased to twice daily due to borderline heart rate. - Lisinopril  on hold due to risk of AKI.  5.  Non-insulin -dependent diabetes mellitus type 2 -Hemoglobin A1c 6.3%. - Glucose from BMET on 03/31/2024 at 86. - Continue metformin . - Outpatient follow-up.  6.  Hyperlipidemia - Statin.   7.  Hypoxemia -Resolved.  8.  Medical decision making capacity -Patient oriented to self and place for the most part but not time. - Patient noted with no insight into his medical condition and treatment options. - Patient evaluated by psychiatry on 11/23. - Patient does not demonstrate capacity to make his own medical decisions. - TOC consulted. - APS involved.  9.  Vitamin D  deficiency - Vitamin D  supplementation.   10.  Dehydration -  Patient receiving intermittent IV fluid boluses.    11.  Goals of care -Patient with poor long-term prognosis due to underlying complex mental health and very limited social support. - Patient does  not demonstrate capacity to make his own medical decisions overall. - Patient with no immediate family member. - Patient reportedly with only 1 estranged brother that does not want to talk to patient. - Patient noted to have lived with her friend, [name] [name] before admission. - To presume DNR was done after discussion with palliative care medicine on 03/27/2024. - APS currently involved for guardianship.  12.  Increased nutritional needs -BMI of 26.26 kg/m. - Continue nutritional supplementation.   DVT prophylaxis: Lovenox  Code Status: DNR Family Communication: No family at bedside.  Awaiting APS guardianship. Disposition: Awaiting on APS guardianship and SNF.  Status is: Inpatient Remains inpatient appropriate because: Unsafe disposition   Consultants:  Neurology PCCM Psychiatry Palliative care  Procedures:  CT head 03/07/2024, 03/17/2024, 03/23/2024 CT C-spine 03/23/2024 Plain films of the right elbow 03/17/2024 Abdominal films 03/12/2024 Chest x-ray 03/08/2024 MRI brain 03/13/2024 EEG 03/10/2024  Antimicrobials:  Anti-infectives (From admission, onward)    Start     Dose/Rate Route Frequency Ordered Stop   03/12/24 1515  amoxicillin -clavulanate (AUGMENTIN ) 875-125 MG per tablet 1 tablet        1 tablet Oral Every 12 hours 03/12/24 1420 03/17/24 0959   03/12/24 1515  doxycycline  (VIBRA -TABS) tablet 100 mg  Status:  Discontinued        100 mg Oral Every 12 hours 03/12/24 1420 03/15/24 1100         Subjective: Laying in bed watching television.  Denies chest pain abdominal pain or shortness of breath.   Objective: Vitals:   04/14/24 1153 04/14/24 1958 04/15/24 0601 04/15/24 0956  BP: 97/72 (!) 158/66 101/79 109/77  Pulse: 64 70 62 63  Resp: 18 16 17    Temp: 97.6 F (36.4 C) 97.8 F (36.6 C) (!) 97.3 F (36.3 C)   TempSrc:  Oral Oral   SpO2: 94% 95% 98%   Weight:      Height:        Intake/Output Summary (Last 24 hours) at 04/15/2024 1556 Last data filed at  04/15/2024 0900 Gross per 24 hour  Intake 240 ml  Output --  Net 240 ml   Filed Weights   03/07/24 2229 03/26/24 1715  Weight: 87.4 kg 83 kg    Examination:  General exam: NAD. Respiratory system: Lungs clear to auscultation bilaterally anterior lung fields.  No wheezes, no crackles, no rhonchi.  Fair air movement.  Speaking in full sentences.  Cardiovascular system: RRR no murmurs rubs or gallops.  No JVD.  No lower extremity edema.  Gastrointestinal system: Abdomen is soft, nontender, nondistended, positive bowel sounds.  No rebound.  No guarding.  Central nervous system: Alert.  Moving extremities spontaneously.  No focal neurological deficits. Extremities: Symmetric 5 x 5 power. Skin: No rashes, lesions or ulcers Psychiatry: Judgement and insight appear poor to fair. Mood & affect appropriate.     Data Reviewed: I have personally reviewed following labs and imaging studies  CBC: No results for input(s): WBC, NEUTROABS, HGB, HCT, MCV, PLT in the last 168 hours.  Basic Metabolic Panel: No results for input(s): NA, K, CL, CO2, GLUCOSE, BUN, CREATININE, CALCIUM , MG, PHOS in the last 168 hours.  GFR: Estimated Creatinine Clearance: 85.5 mL/min (by C-G formula based on SCr of 0.96 mg/dL).  Liver Function Tests: No results for input(s): AST, ALT,  ALKPHOS, BILITOT, PROT, ALBUMIN  in the last 168 hours.  CBG: No results for input(s): GLUCAP in the last 168 hours.   No results found for this or any previous visit (from the past 240 hours).       Radiology Studies: No results found.      Scheduled Meds:  atorvastatin   20 mg Oral Daily   divalproex   625 mg Oral Q8H   DULoxetine   60 mg Oral BID   enoxaparin  (LOVENOX ) injection  40 mg Subcutaneous Q24H   feeding supplement  237 mL Oral BID BM   hydrOXYzine   50 mg Oral QHS   metFORMIN   500 mg Oral BID WC   multivitamin with minerals  1 tablet Oral Daily   propranolol   10  mg Oral BID   valbenazine   80 mg Oral Daily   Continuous Infusions:     LOS: 39 days    Time spent: 35 minutes    Toribio Hummer, MD [facility]   To contact the attending provider between 7A-7P or the covering provider during after hours 7P-7A, please log into the web site [email or web address] and access using universal [place] password for that web site. If you do not have the password, please call the hospital operator.  04/15/2024, 3:56 PM

## 2024-04-15 NOTE — Plan of Care (Signed)

## 2024-04-16 NOTE — Progress Notes (Signed)
 PROGRESS NOTE    [name] [name]  FMW:[id number] DOB: 05/13/1965 DOA: 03/02/2024 PCP: [name], [name]    Chief Complaint  Patient presents with   Placement    Brief Narrative:  58 year old M with PMH of type I bipolar disorder, tardive dyskinesia, PTSD, MDD, GAD, DM-2, tremor, frequent falls and HLD brought to ED by EMS on 10/18 due to talking crazy.    Per EDP note, roommate/friend reported problems with the patient having frequent falls in the house, difficulty caring for himself, erratic behavior including today where he had a very loud outburst in front of her, calling her names, and then was overheard stating he may kill himself.  She is concerned because he has access to 2 guns in the house.     Patient was boarded in the emergency room since 10/18 10/19, psychiatry consulted as patient was endorsing suicidal thoughts-IVC 10/22, psychiatry cleared for discharge. 10/24, patient was noted to be encephalopathic so admitted to the hospital.  11/4, MRI under sedation, transient hypotension and altered mental status-overnight in ICU. 11/5, clinically improved.  Sedation wore off.  Transferred back to MedSurg bed.     Has no capacity. Two-physician DNR on 03/27/2024 after discussion with palliative medicine given patient's multiple medical comorbidity. APS involved.  Waiting on placement.   Assessment & Plan:   Principal Problem:   Acute encephalopathy Active Problems:   Chronic low back pain without sciatica   Acute delirium   Generalized weakness   Bipolar 1 disorder (HCC)   Goals of care, counseling/discussion   Counseling and coordination of care   Palliative care encounter  #1 acute metabolic encephalopathy in patient with underlying psychiatric disorder - Felt to be multifactorial including positive underlying cognitive impairment, dehydration, delirium and iatrogenic. - Imaging of the head including CTA and MRI brain without any acute findings to explain  patient's behavioral issue encephalopathy. - EEG done x 3 negative for any epileptiform activity or seizure-like activity. - TSH, vitamin B12 within normal limits. - Patient with clinical improvement. - Delirium precautions. - Continue TeleSitter.  2.  Bipolar disorder/MDD/GAD/PTSD -Patient noted to have some aggression and combativeness early on during the hospitalization which has subsequently improved. - Currently off restraints. - Patient seen in consultation by psychiatry. - Cogentin  discontinued on 12/2. - Cymbalta  increased to 60 mg twice daily on 12/2. - Continue Ingrezza  80 mg daily. - Continue Depakote  625 mg 3 times daily. - Depakote  level on 12/3 noted at 62. - Continue Atarax  50 mg nightly. - Continue trazodone  and Haldol  as needed. - Appreciate psychiatry input and recommendations.  3.  Hypotension secondary to sedation -Resolved.  4.  Hypertension -BP soft.  - Norvasc  discontinued.  - Continue propranolol  10 mg twice daily which was decreased to twice daily due to borderline heart rate. - Lisinopril  on hold due to risk of AKI.  5.  Non-insulin -dependent diabetes mellitus type 2 -Hemoglobin A1c 6.3%. - Glucose from BMET on 03/31/2024 at 86. - Continue metformin .  - Outpatient follow-up.  6.  Hyperlipidemia - Continue statin.  7.  Hypoxemia -Resolved.  8.  Medical decision making capacity -Patient oriented to self and place for the most part but not time. - Patient noted with no insight into his medical condition and treatment options. - Patient evaluated by psychiatry on 11/23. - Patient does not demonstrate capacity to make his own medical decisions. - TOC consulted. - APS involved.  9.  Vitamin D  deficiency - Vitamin D  supplementation.   10.  Dehydration - Patient receiving intermittent IV fluid boluses.    11.  Goals of care -Patient with poor long-term prognosis due to underlying complex mental health and very limited social support. -  Patient does not demonstrate capacity to make his own medical decisions overall. - Patient with no immediate family member. - Patient reportedly with only 1 estranged brother that does not want to talk to patient. - Patient noted to have lived with her friend, Sari before admission. - To presume DNR was done after discussion with palliative care medicine on 03/27/2024. - APS currently involved for guardianship.  12.  Increased nutritional needs -BMI of 26.26 kg/m. - Continue nutritional supplementation.   DVT prophylaxis: Lovenox  Code Status: DNR Family Communication: No family at bedside.  Awaiting APS guardianship. Disposition: Awaiting on APS guardianship and SNF.  Status is: Inpatient Remains inpatient appropriate because: Unsafe disposition   Consultants:  Neurology PCCM Psychiatry Palliative care  Procedures:  CT head 03/07/2024, 03/17/2024, 03/23/2024 CT C-spine 03/23/2024 Plain films of the right elbow 03/17/2024 Abdominal films 03/12/2024 Chest x-ray 03/08/2024 MRI brain 03/13/2024 EEG 03/10/2024  Antimicrobials:  Anti-infectives (From admission, onward)    Start     Dose/Rate Route Frequency Ordered Stop   03/12/24 1515  amoxicillin -clavulanate (AUGMENTIN ) 875-125 MG per tablet 1 tablet        1 tablet Oral Every 12 hours 03/12/24 1420 03/17/24 0959   03/12/24 1515  doxycycline  (VIBRA -TABS) tablet 100 mg  Status:  Discontinued        100 mg Oral Every 12 hours 03/12/24 1420 03/15/24 1100         Subjective: Laying in bed watching television.  No chest pain, no shortness of breath.  Objective: Vitals:   04/15/24 1815 04/15/24 1941 04/16/24 0445 04/16/24 0905  BP: 113/70 108/68 110/77 108/68  Pulse: 63 64 (!) 56 62  Resp:  18 16   Temp: 97.9 F (36.6 C) 98.2 F (36.8 C) 97.6 F (36.4 C)   TempSrc:      SpO2: 96% 95% 96%   Weight:      Height:       No intake or output data in the 24 hours ending 04/16/24 1343  Filed Weights   03/07/24 2229  03/26/24 1715  Weight: 87.4 kg 83 kg    Examination:  General exam: NAD Respiratory system: CTAB.  No wheezes, no crackles, no rhonchi.  Fair air movement.  Speaking in full sentences.   Cardiovascular system: Regular rate rhythm no murmurs rubs or gallops.  No JVD.  No lower extremity edema.  Gastrointestinal system: Abdomen is soft, nontender, nondistended, positive bowel sounds.  No rebound.  No guarding.   Central nervous system: Alert.  Moving extremities spontaneously.  No focal neurological deficits. Extremities: Symmetric 5 x 5 power. Skin: No rashes, lesions or ulcers Psychiatry: Judgement and insight appear poor to fair. Mood & affect appropriate.     Data Reviewed: I have personally reviewed following labs and imaging studies  CBC: No results for input(s): WBC, NEUTROABS, HGB, HCT, MCV, PLT in the last 168 hours.  Basic Metabolic Panel: No results for input(s): NA, K, CL, CO2, GLUCOSE, BUN, CREATININE, CALCIUM , MG, PHOS in the last 168 hours.  GFR: Estimated Creatinine Clearance: 85.5 mL/min (by C-G formula based on SCr of 0.96 mg/dL).  Liver Function Tests: No results for input(s): AST, ALT, ALKPHOS, BILITOT, PROT, ALBUMIN  in the last 168 hours.  CBG: No results for input(s): GLUCAP in the last 168 hours.   No  results found for this or any previous visit (from the past 240 hours).       Radiology Studies: No results found.      Scheduled Meds:  atorvastatin   20 mg Oral Daily   divalproex   625 mg Oral Q8H   DULoxetine   60 mg Oral BID   enoxaparin  (LOVENOX ) injection  40 mg Subcutaneous Q24H   feeding supplement  237 mL Oral BID BM   hydrOXYzine   50 mg Oral QHS   metFORMIN   500 mg Oral BID WC   multivitamin with minerals  1 tablet Oral Daily   propranolol   10 mg Oral BID   valbenazine   80 mg Oral Daily   Continuous Infusions:     LOS: 40 days    Time spent: 35 minutes    Toribio Hummer,  MD [facility]   To contact the attending provider between 7A-7P or the covering provider during after hours 7P-7A, please log into the web site [email or web address] and access using universal [place] password for that web site. If you do not have the password, please call the hospital operator.  04/16/2024, 1:43 PM

## 2024-04-16 NOTE — Plan of Care (Signed)
   Problem: Clinical Measurements: Goal: Ability to maintain clinical measurements within normal limits will improve Outcome: Progressing   Problem: Activity: Goal: Risk for activity intolerance will decrease Outcome: Progressing   Problem: Nutrition: Goal: Adequate nutrition will be maintained Outcome: Progressing

## 2024-04-16 NOTE — Plan of Care (Signed)

## 2024-04-16 NOTE — TOC Progression Note (Addendum)
 Transition of Care [facility]) - Progression Note    Patient Details  Name: [name] [name] MRN: [id number] Date of Birth: 1966-03-28  [facility] CM/SW Contact  Doneta Glenys DASEN, RN Phone Number: 04/16/2024, 9:13 AM  Clinical Narrative:    CM called [facility]. Patient has been elevated too LEVEL 2 which requires an in-person interview [id number] PASRR Level II Referral Notification 04/12/2024 13:33:25. [facility] has 7 days to complete.    Barriers to Discharge: SNF Pending bed offer               Expected Discharge Plan and Services                                               Social Drivers of Health (SDOH) Interventions SDOH Screenings   Food Insecurity: Patient Unable To Answer (03/07/2024)  Housing: Unknown (03/07/2024)  Transportation Needs: Patient Unable To Answer (03/07/2024)  Utilities: Patient Unable To Answer (03/07/2024)  Depression (PHQ2-9): High Risk (01/26/2024)  Social Connections: Unknown (09/22/2021)   Received from [facility]  Tobacco Use: Low Risk  (03/13/2024)    Readmission Risk Interventions     No data to display

## 2024-04-17 NOTE — Progress Notes (Signed)
 PROGRESS NOTE    [name] [name]  FMW:[id number] DOB: Apr 08, 1966 DOA: 03/02/2024 PCP: [name], [name]    Chief Complaint  Patient presents with   Placement    Brief Narrative:  58 year old M with PMH of type I bipolar disorder, tardive dyskinesia, PTSD, MDD, GAD, DM-2, tremor, frequent falls and HLD brought to ED by EMS on 10/18 due to talking crazy.    Per EDP note, roommate/friend reported problems with the patient having frequent falls in the house, difficulty caring for himself, erratic behavior including today where he had a very loud outburst in front of her, calling her names, and then was overheard stating he may kill himself.  She is concerned because he has access to 2 guns in the house.     Patient was boarded in the emergency room since 10/18 10/19, psychiatry consulted as patient was endorsing suicidal thoughts-IVC 10/22, psychiatry cleared for discharge. 10/24, patient was noted to be encephalopathic so admitted to the hospital.  11/4, MRI under sedation, transient hypotension and altered mental status-overnight in ICU. 11/5, clinically improved.  Sedation wore off.  Transferred back to MedSurg bed.     Has no capacity. Two-physician DNR on 03/27/2024 after discussion with palliative medicine given patient's multiple medical comorbidity. APS involved.  Waiting on placement.   Assessment & Plan:   Principal Problem:   Acute encephalopathy Active Problems:   Chronic low back pain without sciatica   Acute delirium   Generalized weakness   Bipolar 1 disorder (HCC)   Goals of care, counseling/discussion   Counseling and coordination of care   Palliative care encounter  #1 acute metabolic encephalopathy in patient with underlying psychiatric disorder - Felt to be multifactorial including positive underlying cognitive impairment, dehydration, delirium and iatrogenic. - Imaging of the head including CTA and MRI brain without any acute findings to explain  patient's behavioral issue encephalopathy. - EEG done x 3 negative for any epileptiform activity or seizure-like activity. - TSH, vitamin B12 within normal limits. - Patient with clinical improvement. - Delirium precautions. - Continue TeleSitter.  2.  Bipolar disorder/MDD/GAD/PTSD -Patient noted to have some aggression and combativeness early on during the hospitalization which has subsequently improved. - Currently off restraints. - Patient seen in consultation by psychiatry. - Cogentin  discontinued on 12/2. - Cymbalta  increased to 60 mg twice daily on 12/2. - Continue Ingrezza  80 mg daily. - Continue Depakote  625 mg 3 times daily. - Depakote  level on 12/3 noted at 62. - Continue Atarax  50 mg nightly. - Continue trazodone  and Haldol  as needed. - Appreciate psychiatry input and recommendations.  3.  Hypotension secondary to sedation -Resolved.  4.  Hypertension -BP soft.  - Norvasc  discontinued.  - Continue propranolol  10 mg twice daily which was decreased to twice daily due to borderline heart rate. - Lisinopril  on hold due to risk of AKI.  5.  Non-insulin -dependent diabetes mellitus type 2 -Hemoglobin A1c 6.3%. - Glucose from BMET on 03/31/2024 at 86. - Continue metformin .  - Outpatient follow-up.  6.  Hyperlipidemia - Statin.   7.  Hypoxemia -Resolved.  8.  Medical decision making capacity -Patient oriented to self and place for the most part but not time. - Patient noted with no insight into his medical condition and treatment options. - Patient evaluated by psychiatry on 11/23. - Patient does not demonstrate capacity to make his own medical decisions. - TOC consulted. - APS involved.  9.  Vitamin D  deficiency - Vitamin D  supplementation.   10.  Dehydration - Status post intermittent IV fluid boluses.   11.  Goals of care -Patient with poor long-term prognosis due to underlying complex mental health and very limited social support. - Patient does not  demonstrate capacity to make his own medical decisions overall. - Patient with no immediate family member. - Patient reportedly with only 1 estranged brother that does not want to talk to patient. - Patient noted to have lived with her friend, Sari before admission. - To presume DNR was done after discussion with palliative care medicine on 03/27/2024. - APS currently involved for guardianship.  12.  Increased nutritional needs -BMI of 26.26 kg/m. - Nutritional supplementation.    DVT prophylaxis: Lovenox  Code Status: DNR Family Communication: No family at bedside.  Awaiting APS guardianship. Disposition: Awaiting on APS guardianship and SNF.  Status is: Inpatient Remains inpatient appropriate because: Unsafe disposition   Consultants:  Neurology PCCM Psychiatry Palliative care  Procedures:  CT head 03/07/2024, 03/17/2024, 03/23/2024 CT C-spine 03/23/2024 Plain films of the right elbow 03/17/2024 Abdominal films 03/12/2024 Chest x-ray 03/08/2024 MRI brain 03/13/2024 EEG 03/10/2024  Antimicrobials:  Anti-infectives (From admission, onward)    Start     Dose/Rate Route Frequency Ordered Stop   03/12/24 1515  amoxicillin -clavulanate (AUGMENTIN ) 875-125 MG per tablet 1 tablet        1 tablet Oral Every 12 hours 03/12/24 1420 03/17/24 0959   03/12/24 1515  doxycycline  (VIBRA -TABS) tablet 100 mg  Status:  Discontinued        100 mg Oral Every 12 hours 03/12/24 1420 03/15/24 1100         Subjective: Laying in bed watching television.  Denies any chest pain or shortness of breath.  No abdominal pain.    Objective: Vitals:   04/16/24 0905 04/16/24 1452 04/16/24 1953 04/17/24 0502  BP: 108/68 106/64 99/73 106/69  Pulse: 62 62 67 61  Resp:  18 18 18   Temp:  (!) 97.3 F (36.3 C) 97.6 F (36.4 C) 97.6 F (36.4 C)  TempSrc:   Oral Oral  SpO2:  97% 94% 93%  Weight:      Height:        Intake/Output Summary (Last 24 hours) at 04/17/2024 1315 Last data filed at  04/17/2024 9076 Gross per 24 hour  Intake 240 ml  Output --  Net 240 ml    Filed Weights   03/07/24 2229 03/26/24 1715  Weight: 87.4 kg 83 kg    Examination:  General exam: NAD Respiratory system: Lungs clear to auscultation bilaterally anterior lung fields.  No wheezes, no crackles, no rhonchi.  Fair air movement.  Speaking in full sentences.  No use of accessory muscles of respiration.  Cardiovascular system: Regular rate rhythm no murmurs rubs or gallops.  No JVD.  No lower extremity edema.  Gastrointestinal system: Abdomen soft, nontender, nondistended, positive bowel sounds.  No rebound.  No guarding.  Central nervous system: Alert.  Moving extremities spontaneously.  No focal neurological deficits. Extremities: Symmetric 5 x 5 power. Skin: No rashes, lesions or ulcers Psychiatry: Judgement and insight appear poor. Mood & affect appropriate.     Data Reviewed: I have personally reviewed following labs and imaging studies  CBC: No results for input(s): WBC, NEUTROABS, HGB, HCT, MCV, PLT in the last 168 hours.  Basic Metabolic Panel: No results for input(s): NA, K, CL, CO2, GLUCOSE, BUN, CREATININE, CALCIUM , MG, PHOS in the last 168 hours.  GFR: Estimated Creatinine Clearance: 85.5 mL/min (by C-G formula based on SCr of  0.96 mg/dL).  Liver Function Tests: No results for input(s): AST, ALT, ALKPHOS, BILITOT, PROT, ALBUMIN  in the last 168 hours.  CBG: No results for input(s): GLUCAP in the last 168 hours.   No results found for this or any previous visit (from the past 240 hours).       Radiology Studies: No results found.      Scheduled Meds:  atorvastatin   20 mg Oral Daily   divalproex   625 mg Oral Q8H   DULoxetine   60 mg Oral BID   enoxaparin  (LOVENOX ) injection  40 mg Subcutaneous Q24H   feeding supplement  237 mL Oral BID BM   hydrOXYzine   50 mg Oral QHS   metFORMIN   500 mg Oral BID WC   multivitamin  with minerals  1 tablet Oral Daily   propranolol   10 mg Oral BID   valbenazine   80 mg Oral Daily   Continuous Infusions:     LOS: 41 days    Time spent: 35 minutes    Toribio Hummer, MD [facility]   To contact the attending provider between 7A-7P or the covering provider during after hours 7P-7A, please log into the web site [email or web address] and access using universal [place] password for that web site. If you do not have the password, please call the hospital operator.  04/17/2024, 1:15 PM

## 2024-04-17 NOTE — Plan of Care (Signed)

## 2024-04-17 NOTE — Progress Notes (Signed)
 Daily Progress Note   Patient Name: [name] [name]       Date: 04/17/2024 DOB: April 15, 1966  Age: 58 y.o. MRN#: [id number] Attending Physician: Sebastian Toribio GAILS, MD Primary Care Physician: [name], [name] Admit Date: 03/02/2024 Length of Stay: 41 days  Reason for Consultation/Follow-up: Establishing goals of care  Subjective:   Reviewed EMR including recent documentation from hospitalist, TOC, and psychiatry.  TOC still assisting with disposition.  APS involved to assist with decision making and guardianship.  Psychiatry has provided recommendations regarding medication management.  Presented to bedside to see patient.  Patient laying calmly in bed not very interactive.     Objective:   Vital Signs:  BP 106/69 (BP Location: Right Arm)   Pulse 61   Temp 97.6 F (36.4 C) (Oral)   Resp 18   Ht 5' 10 (1.778 m)   Wt 83 kg   SpO2 93%   BMI 26.26 kg/m   Physical Exam: General: Awake, calmly laying in bed Cardiovascular: Bradycardia noted Respiratory: no increased work of breathing noted, not in respiratory distress Abdomen: not distended Neuro: Awake  Assessment & Plan:   Assessment: Patient is a 58 year old male with a past medical history of type I bipolar disorder, tardive dyskinesia, PTSD, MDD, GAD, tremor, frequent falls, and hyperlipidemia who was brought to the ED on 02/25/2023 by EMS for talking crazy.  Patient's roommate/friend reported patient was having frequent falls, erratic behavior, and difficulty caring for himself at home; roommate had also overheard patient stating that he may kill himself which was concerning since he has access to 2 guns in the house.  Initially patient was endorsing suicidal thoughts and IVC was obtained by psychiatry though psychiatry cleared for discharge on 02/29/2024.  Patient then noted to be encephalopathic and so was admitted to the hospital on 03/02/2024.  Hospitalization further complicated by hypotension requiring ICU  admission though has currently resolved.  TOC involved to seek guardianship for patient as no family or friends willing to make decisions on patient's behalf.  Palliative medicine team consulted to assist with complex medical decision making.   Recommendations/Plan: # Complex medical decision making/goals of care:   - Patient unable to participate in complex medical decision making due to underlying medical status.  Patient does not have friends or family willing to make decisions on his behalf.  Psychiatry continuing appropriate adjustment of medications for psychiatric illness management.  TOC working with APS for guardianship and placement.  Palliative medicine team will continue to follow along peripherally though please reach out if acute PMT needs arise.                   -Already discussed with hospitalist about two-physician DNR based on patient's multiple medical comorbidities.  Continuing medically appropriate and reasonable medical interventions at this time and these are being done according to excepted practice and standards of care.  In the event of cardiac or respiratory arrest, an attempt at resuscitation would be unsuccessful in returning this patient to an already fragile state of health and cause unnecessary suffering in a patient with these multiple underlying medical comorbidities. Support placing a maintaining a DNR order and support the care plan as outlined by the primary service.   # Psycho-social/Spiritual Support:  - TOC seeking guardianship/working with DSS   # Discharge Planning:  SNF pending bed offer. TOC on board and continues to follow closely.   Thank you for allowing the palliative care team to participate in the care [name]  [name].  Low mdm Henna Derderian md Palliative Care Provider PMT # [phone]  If patient remains symptomatic despite maximum doses, please call PMT at [phone] between 0700 and 1900. Outside of these hours, please call attending, as PMT  does not have night coverage.

## 2024-04-17 NOTE — TOC Progression Note (Signed)
 Transition of Care [facility]) - Progression Note    Patient Details  Name: [name] [name] MRN: [id number] Date of Birth: 07-04-1965  Transition of Care [facility] CM/SW Contact  Doneta Glenys DASEN, RN Phone Number: 04/17/2024, 8:35 AM  Clinical Narrative:    CM called Viviane Gosling [phone] for update on legal guardian.     Barriers to Discharge: SNF Pending bed offer               Expected Discharge Plan and Services                                               Social Drivers of Health (SDOH) Interventions SDOH Screenings   Food Insecurity: Patient Unable To Answer (03/07/2024)  Housing: Unknown (03/07/2024)  Transportation Needs: Patient Unable To Answer (03/07/2024)  Utilities: Patient Unable To Answer (03/07/2024)  Depression (PHQ2-9): High Risk (01/26/2024)  Social Connections: Unknown (09/22/2021)   Received from [facility]  Tobacco Use: Low Risk  (03/13/2024)    Readmission Risk Interventions     No data to display

## 2024-04-18 NOTE — Progress Notes (Signed)
 PROGRESS NOTE    [name] [name]  FMW:[id number] DOB: Sep 13, 1965 DOA: 03/02/2024 PCP: [name], [name]    Brief Narrative:   58 year old M with PMH of type I bipolar disorder, tardive dyskinesia, PTSD, MDD, GAD, DM-2, tremor, frequent falls and HLD brought to ED by EMS on 10/18 due to talking crazy.    Per EDP note, roommate/friend reported problems with the patient having frequent falls in the house, difficulty caring for himself, erratic behavior including today where he had a very loud outburst in front of her, calling her names, and then was overheard stating he may kill himself.  She is concerned because he has access to 2 guns in the house.     Patient was boarded in the emergency room since 10/18 10/19, psychiatry consulted as patient was endorsing suicidal thoughts-IVC 10/22, psychiatry cleared for discharge. 10/24, patient was noted to be encephalopathic so admitted to the hospital.  11/4, MRI under sedation, transient hypotension and altered mental status-overnight in ICU. 11/5, clinically improved.  Sedation wore off.  Transferred back to MedSurg bed.     Has no capacity. Two-physician DNR on 03/27/2024 after discussion with palliative medicine given patient's multiple medical comorbidity. APS involved.  Waiting on placement.  Assessment & Plan:  Acute metabolic encephalopathy in patient with underlying psychiatric disorder - Felt to be multifactorial including positive underlying cognitive impairment, dehydration, delirium and iatrogenic.  CTA and MRI brain negative for acute findings.  EEG X3 negative.  TSH, B12 within normal limits.  Delirium precaution, TeleSitter   Bipolar disorder/MDD/GAD/PTSD -Patient noted to have some aggression and combativeness early on during the hospitalization which has subsequently improved.  Currently off restraints and on multiple medications.  Seen by psychiatry - Currently off restraints. - Patient seen in consultation by  psychiatry. - Cogentin  discontinued on 12/2. - Cymbalta  increased to 60 mg twice daily on 12/2. - Continue Ingrezza  80 mg daily. - Continue Depakote  625 mg 3 times daily. - Depakote  level on 12/3 noted at 62. - Continue Atarax  50 mg nightly. - Continue trazodone  and Haldol  as needed. - Appreciate psychiatry input and recommendations.   Hypotension secondary to sedation -Resolved.   Hypertension Currently on propranolol  twice daily   Non-insulin -dependent diabetes mellitus type 2 -Hemoglobin A1c 6.3%. Metformin .     Hyperlipidemia - Statin.    Hypoxemia -Resolved.   Medical decision making capacity Does not necessarily demonstrate any capacity to make his own medical decision.  APS is involved   Vitamin D  deficiency - Vitamin D  supplementation.    Dehydration - Status post intermittent IV fluid boluses.    Goals of care Previously two-physician DNR signed   51.  Increased nutritional needs -BMI of 26.26 kg/m. - Nutritional supplementation.      DVT prophylaxis: Lovenox  Code Status: DNR Family Communication: No family at bedside.  Awaiting APS guardianship. Disposition: Awaiting on APS guardianship and SNF.   Status is: Inpatient Remains inpatient appropriate because: Unsafe disposition   Consultants:  Neurology PCCM Psychiatry Palliative care   Procedures:  CT head 03/07/2024, 03/17/2024, 03/23/2024 CT C-spine 03/23/2024 Plain films of the right elbow 03/17/2024 Abdominal films 03/12/2024 Chest x-ray 03/08/2024 MRI brain 03/13/2024 EEG 03/10/2024     DVT prophylaxis: enoxaparin  (LOVENOX ) injection 40 mg Start: 03/08/24 0800      Code Status: Limited: Do not attempt resuscitation (DNR) -DNR-LIMITED -Do Not Intubate/DNI  Family Communication:   Status is: Inpatient Remains inpatient appropriate because: Ongoing safe disposition plans   PT Follow up Recs: Recommend  Snf, Barriers To Snf Placement - Toc To F/U With Patient/Family For D/C  Plans12/08/2023 1528  Subjective:  No complaints feeling okay  Examination:  General exam: Appears calm and comfortable  Respiratory system: Clear to auscultation. Respiratory effort normal. Cardiovascular system: S1 & S2 heard, RRR. No JVD, murmurs, rubs, gallops or clicks. No pedal edema. Gastrointestinal system: Abdomen is nondistended, soft and nontender. No organomegaly or masses felt. Normal bowel sounds heard. Central nervous system: Alert and oriented. No focal neurological deficits. Extremities: Symmetric 5 x 5 power. Skin: No rashes, lesions or ulcers Psychiatry: Judgement and insight appear normal. Mood & affect appropriate.                Diet Orders (From admission, onward)     Start     Ordered   03/08/24 1221  Diet Carb Modified Fluid consistency: Thin; Room service appropriate? Yes  Diet effective now       Question Answer Comment  Diet-HS Snack? Nothing   Calorie Level Medium 1600-2000   Fluid consistency: Thin   Room service appropriate? Yes      03/08/24 1220            Objective: Vitals:   04/16/24 1953 04/17/24 0502 04/17/24 1935 04/18/24 0541  BP: 99/73 106/69 97/60 108/67  Pulse: 67 61 67 62  Resp: 18 18 18 16   Temp: 97.6 F (36.4 C) 97.6 F (36.4 C) 97.8 F (36.6 C) (!) 97.5 F (36.4 C)  TempSrc: Oral Oral Oral Oral  SpO2: 94% 93% 93% 93%  Weight:      Height:        Intake/Output Summary (Last 24 hours) at 04/18/2024 1215 Last data filed at 04/18/2024 0900 Gross per 24 hour  Intake 180 ml  Output --  Net 180 ml   Filed Weights   03/07/24 2229 03/26/24 1715  Weight: 87.4 kg 83 kg    Scheduled Meds:  atorvastatin   20 mg Oral Daily   divalproex   625 mg Oral Q8H   DULoxetine   60 mg Oral BID   enoxaparin  (LOVENOX ) injection  40 mg Subcutaneous Q24H   hydrOXYzine   50 mg Oral QHS   metFORMIN   500 mg Oral BID WC   multivitamin with minerals  1 tablet Oral Daily   propranolol   10 mg Oral BID   valbenazine   80 mg Oral  Daily   Continuous Infusions:  Nutritional status Signs/Symptoms: estimated needs Interventions: Ensure Enlive (each supplement provides 350kcal and 20 grams of protein), MVI Body mass index is 26.26 kg/m.  Data Reviewed:   CBC: No results for input(s): WBC, NEUTROABS, HGB, HCT, MCV, PLT in the last 168 hours. Basic Metabolic Panel: No results for input(s): NA, K, CL, CO2, GLUCOSE, BUN, CREATININE, CALCIUM , MG, PHOS in the last 168 hours. GFR: Estimated Creatinine Clearance: 85.5 mL/min (by C-G formula based on SCr of 0.96 mg/dL). Liver Function Tests: No results for input(s): AST, ALT, ALKPHOS, BILITOT, PROT, ALBUMIN  in the last 168 hours. No results for input(s): LIPASE, AMYLASE in the last 168 hours. No results for input(s): AMMONIA in the last 168 hours. Coagulation Profile: No results for input(s): INR, PROTIME in the last 168 hours. Cardiac Enzymes: No results for input(s): CKTOTAL, CKMB, CKMBINDEX, TROPONINI in the last 168 hours. BNP (last 3 results) No results for input(s): PROBNP in the last 8760 hours. HbA1C: No results for input(s): HGBA1C in the last 72 hours. CBG: No results for input(s): GLUCAP in the last 168 hours. Lipid Profile: No  results for input(s): CHOL, HDL, LDLCALC, TRIG, CHOLHDL, LDLDIRECT in the last 72 hours. Thyroid  Function Tests: No results for input(s): TSH, T4TOTAL, FREET4, T3FREE, THYROIDAB in the last 72 hours. Anemia Panel: No results for input(s): VITAMINB12, FOLATE, FERRITIN, TIBC, IRON, RETICCTPCT in the last 72 hours. Sepsis Labs: No results for input(s): PROCALCITON, LATICACIDVEN in the last 168 hours.  No results found for this or any previous visit (from the past 240 hours).       Radiology Studies: No results found.         LOS: 42 days   Time spent= 35 mins    [place]urgess JAYSON Dare, MD [facility]  If  7PM-7AM, please contact night-coverage  04/18/2024, 12:15 PM

## 2024-04-18 NOTE — Progress Notes (Signed)
 Physical Therapy Treatment Patient Details Name: [name] [name] MRN: [id number] DOB: [birth year] Today's Date: 04/18/2024   History of Present Illness Pt is a 58 y/o male presenting on 10/24 for fall and chronic back pain. 10/19 psychiatry consulted due to suicidal thoughts, cleared on 10/22. Change in mental status due to acute delirium, and admitted from ED with acute metabolic encephalopathy. CT negative, EEG negative, MRI pending, chest x ray with hypoventilation. PMH includes: bipolar disorder type 1, tardive dyskinesia, PTSD, MDD, CAD, DM2, OA, tremors.    PT Comments  Pt AxO x 2 pleasant and following all directions.  Assisted OOB to amb in hallway, assisted to bathroom then back to bed. TOC working on placement.    If plan is discharge home, recommend the following: Assistance with cooking/housework;Assist for transportation;Help with stairs or ramp for entrance;Direct supervision/assist for medications management;A little help with walking and/or transfers;A little help with bathing/dressing/bathroom;Direct supervision/assist for financial management   Can travel by private vehicle     Yes  Equipment Recommendations  None recommended by PT    Recommendations for Other Services       Precautions / Restrictions Precautions Precautions: Fall Precaution/Restrictions Comments: Parkinson's Restrictions Weight Bearing Restrictions Per Provider Order: No     Mobility  Bed Mobility Overal bed mobility: Needs Assistance Bed Mobility: Supine to Sit, Sit to Supine     Supine to sit: Mod assist     General bed mobility comments: Required Mod Assist to transfer to EOB due to rigidity and posterior lean (typical Parkinson's)    Transfers Overall transfer level: Needs assistance Equipment used: Rolling walker (2 wheels), None Transfers: Sit to/from Stand Sit to Stand: Mod assist           General transfer comment: Required Mod Assist to rise due to impaired initiation  and rigidity with posterior lean (typical Parkinsons).  Also assisted on/off toilet requiring assist for balance and control.  Pt tends to fall backward uncontrolled.  Rigidity.  Bradykinesia.  Typical Parkinson's. Assisted with hygiene/peri care after void/BM.  Assisted with gown change.     Ambulation/Gait Ambulation/Gait assistance: Contact guard assist, Min assist Gait Distance (Feet): 185 Feet Assistive device: Rolling walker (2 wheels) Gait Pattern/deviations: Step-through pattern, Decreased stride length, Narrow base of support, Shuffle       General Gait Details: Tolerated an increased distance 185 feet using walker and 25% VC's to take BIG steps and guard with any posterior LOB.  Typical Parkinson's gait of festination in doorways, short shuffled steps andf rigidity.  HIGH FALL RISK.   Stairs             Wheelchair Mobility     Tilt Bed    Modified Rankin (Stroke Patients Only)       Balance                                            Communication Communication Communication: No apparent difficulties  Cognition Arousal: Alert Behavior During Therapy: Flat affect   PT - Cognitive impairments: Orientation                       PT - Cognition Comments: AxO x 1 pleasant and cooperative.  Following all VC's with increased time.  Slow to move initially. Following commands: Impaired Following commands impaired: Follows one step commands with increased time  Cueing Cueing Techniques: Verbal cues, Gestural cues  Exercises      General Comments        Pertinent Vitals/Pain Pain Assessment Pain Assessment: No/denies pain    Home Living                          Prior Function            PT Goals (current goals can now be found in the care plan section) Progress towards PT goals: Progressing toward goals    Frequency    Min 2X/week      PT Plan      Co-evaluation              AM-PAC PT 6  Clicks Mobility   Outcome Measure  Help needed turning from your back to your side while in a flat bed without using bedrails?: A Little Help needed moving from lying on your back to sitting on the side of a flat bed without using bedrails?: A Little Help needed moving to and from a bed to a chair (including a wheelchair)?: A Little Help needed standing up from a chair using your arms (e.g., wheelchair or bedside chair)?: A Little Help needed to walk in hospital room?: A Little Help needed climbing 3-5 steps with a railing? : A Lot 6 Click Score: 17    End of Session Equipment Utilized During Treatment: Gait belt Activity Tolerance: Patient tolerated treatment well Patient left: with call bell/phone within reach;with bed alarm set;Other (comment) chiropractor) Nurse Communication: Mobility status PT Visit Diagnosis: History of falling (Z91.81);Unsteadiness on feet (R26.81);Other abnormalities of gait and mobility (R26.89);Muscle weakness (generalized) (M62.81);Repeated falls (R29.6)     Time: 8493-8467 PT Time Calculation (min) (ACUTE ONLY): 26 min  Charges:    $Gait Training: 8-22 mins $Therapeutic Activity: 8-22 mins PT General Charges $$ ACUTE PT VISIT: 1 Visit                    Katheryn Leap  PTA Acute  Rehabilitation Services Office M-F          [phone]

## 2024-04-18 NOTE — Plan of Care (Signed)

## 2024-04-18 NOTE — TOC Progression Note (Signed)
 Transition of Care [facility]) - Progression Note    Patient Details  Name: [name] [name] MRN: [id number] Date of Birth: Sep 06, 1965  Transition of Care [facility]) CM/SW Contact  Doneta Glenys DASEN, RN Phone Number: 04/18/2024, 8:49 AM  Clinical Narrative:    [facility] Case Manager Alimah B. [phone] ext. [phone] called to offer assistance with discharge planning for outpatient needs.    Barriers to Discharge: SNF Pending bed offer               Expected Discharge Plan and Services                                               Social Drivers of Health (SDOH) Interventions SDOH Screenings   Food Insecurity: Patient Unable To Answer (03/07/2024)  Housing: Unknown (03/07/2024)  Transportation Needs: Patient Unable To Answer (03/07/2024)  Utilities: Patient Unable To Answer (03/07/2024)  Depression (PHQ2-9): High Risk (01/26/2024)  Social Connections: Unknown (09/22/2021)   Received from [facility]  Tobacco Use: Low Risk  (03/13/2024)    Readmission Risk Interventions     No data to display

## 2024-04-18 NOTE — Progress Notes (Signed)
 Occupational Therapy Treatment Patient Details Name: [name] [name] MRN: [id number] DOB: 1965-06-19 Today's Date: 04/18/2024   History of present illness Pt is a 58 y/o male presenting on 10/24 for fall and chronic back pain. 10/19 psychiatry consulted due to suicidal thoughts, cleared on 10/22. Change in mental status due to acute delirium, and admitted from ED with acute metabolic encephalopathy. CT negative, EEG negative, MRI pending, chest x ray with hypoventilation. PMH includes: bipolar disorder type 1, tardive dyskinesia, PTSD, MDD, CAD, DM2, OA, tremors.   OT comments  Pt readily willing to work with OT. Came to EOB with min assist to raise trunk and increased time. Completed grooming with set up and changed gown with min assist. Stood with min assist from low bed with RW, continues to demonstrate posterior bias. Pt declined up to chair. Patient will benefit from continued inpatient follow up therapy, <3 hours/day.      If plan is discharge home, recommend the following:  A little help with walking and/or transfers;A lot of help with bathing/dressing/bathroom;Assistance with cooking/housework;Direct supervision/assist for medications management;Direct supervision/assist for financial management;Assist for transportation;Help with stairs or ramp for entrance   Equipment Recommendations  BSC/3in1    Recommendations for Other Services      Precautions / Restrictions Precautions Precautions: Fall Recall of Precautions/Restrictions: Impaired Restrictions Weight Bearing Restrictions Per Provider Order: No       Mobility Bed Mobility Overal bed mobility: Needs Assistance Bed Mobility: Supine to Sit, Sit to Supine     Supine to sit: Min assist, HOB elevated Sit to supine: Supervision   General bed mobility comments: assist to raise trunk    Transfers Overall transfer level: Needs assistance Equipment used: Rolling walker (2 wheels) Transfers: Sit to/from Stand Sit to  Stand: Min assist           General transfer comment: cues for hand placement, min assist to transfer weight anterior over feet from low bed     Balance Overall balance assessment: Needs assistance   Sitting balance-Leahy Scale: Fair   Postural control: Posterior lean Standing balance support: Bilateral upper extremity supported, During functional activity, Reliant on assistive device for balance Standing balance-Leahy Scale: Poor                             ADL either performed or assessed with clinical judgement   ADL Overall ADL's : Needs assistance/impaired     Grooming: Wash/dry hands;Oral care;Sitting;Supervision/safety           Upper Body Dressing : Minimal assistance;Sitting                          Extremity/Trunk Assessment              Vision       Perception     Praxis     Communication Communication Communication: No apparent difficulties Factors Affecting Communication: Other (comment) (more talkative today)   Cognition Arousal: Alert Behavior During Therapy: Flat affect (laughing intermittently) Cognition: Cognition impaired   Orientation impairments: Time Awareness: Online awareness impaired Memory impairment (select all impairments): Declarative long-term memory, Working civil service fast streamer, Short-term memory Attention impairment (select first level of impairment): Divided attention Executive functioning impairment (select all impairments): Reasoning, Problem solving OT - Cognition Comments: does not report broken tv or ask for assist to be able to watch favored program  Following commands: Impaired Following commands impaired: Follows one step commands with increased time      Cueing   Cueing Techniques: Verbal cues, Gestural cues  Exercises      Shoulder Instructions       General Comments      Pertinent Vitals/ Pain       Pain Assessment Pain Assessment: Faces Faces Pain Scale: No  hurt  Home Living                                          Prior Functioning/Environment              Frequency  Min 2X/week        Progress Toward Goals  OT Goals(current goals can now be found in the care plan section)  Progress towards OT goals: Progressing toward goals  Acute Rehab OT Goals OT Goal Formulation: With patient Time For Goal Achievement: 04/22/24 Potential to Achieve Goals: Good  Plan      Co-evaluation                 AM-PAC OT 6 Clicks Daily Activity     Outcome Measure   Help from another person eating meals?: A Little Help from another person taking care of personal grooming?: A Little Help from another person toileting, which includes using toliet, bedpan, or urinal?: A Little Help from another person bathing (including washing, rinsing, drying)?: A Lot Help from another person to put on and taking off regular upper body clothing?: A Little Help from another person to put on and taking off regular lower body clothing?: A Lot 6 Click Score: 16    End of Session Equipment Utilized During Treatment: Gait belt;Rolling walker (2 wheels)  OT Visit Diagnosis: Other abnormalities of gait and mobility (R26.89);Unsteadiness on feet (R26.81);Muscle weakness (generalized) (M62.81);History of falling (Z91.81);Other symptoms and signs involving cognitive function   Activity Tolerance Patient tolerated treatment well   Patient Left in bed;with call bell/phone within reach;with bed alarm set   Nurse Communication Other (comment) (secretary aware tv does not work)        Time: 8975-8950 OT Time Calculation (min): 25 min  Charges: OT General Charges $OT Visit: 1 Visit OT Treatments $Self Care/Home Management : 23-37 mins  Mliss HERO, OTR/L Acute Rehabilitation Services Office: [phone]   [name] 04/18/2024, 10:57 AM

## 2024-04-19 NOTE — Progress Notes (Signed)
 PROGRESS NOTE    [name] [name]  FMW:[id number] DOB: May 11, 1965 DOA: 03/02/2024 PCP: [name], [name]    Brief Narrative:   58 year old M with PMH of type I bipolar disorder, tardive dyskinesia, PTSD, MDD, GAD, DM-2, tremor, frequent falls and HLD brought to ED by EMS on 10/18 due to talking crazy.    Per EDP note, roommate/friend reported problems with the patient having frequent falls in the house, difficulty caring for himself, erratic behavior including today where he had a very loud outburst in front of her, calling her names, and then was overheard stating he may kill himself.  She is concerned because he has access to 2 guns in the house.     Patient was boarded in the emergency room since 10/18 10/19, psychiatry consulted as patient was endorsing suicidal thoughts-IVC 10/22, psychiatry cleared for discharge. 10/24, patient was noted to be encephalopathic so admitted to the hospital.  11/4, MRI under sedation, transient hypotension and altered mental status-overnight in ICU. 11/5, clinically improved.  Sedation wore off.  Transferred back to MedSurg bed.     Has no capacity. Two-physician DNR on 03/27/2024 after discussion with palliative medicine given patient's multiple medical comorbidity. APS involved.  Waiting on placement.  Assessment & Plan:  Acute metabolic encephalopathy in patient with underlying psychiatric disorder - Felt to be multifactorial including positive underlying cognitive impairment, dehydration, delirium and iatrogenic.  CTA and MRI brain negative for acute findings.  EEG X3 negative.  TSH, B12 within normal limits.  Delirium precaution, TeleSitter   Bipolar disorder/MDD/GAD/PTSD -Patient noted to have some aggression and combativeness early on during the hospitalization which has subsequently improved.  Currently off restraints and on multiple medications.  Seen by psychiatry - Currently off restraints. - Patient seen in consultation by  psychiatry. - Cogentin  discontinued on 12/2. - Cymbalta  increased to 60 mg twice daily on 12/2. - Continue Ingrezza  80 mg daily. - Continue Depakote  625 mg 3 times daily. - Depakote  level on 12/3 noted at 62. - Continue Atarax  50 mg nightly. - Continue trazodone  and Haldol  as needed. - Appreciate psychiatry input and recommendations.   Hypotension secondary to sedation -Resolved.   Hypertension Currently on propranolol  twice daily   Non-insulin -dependent diabetes mellitus type 2 -Hemoglobin A1c 6.3%. Metformin .     Hyperlipidemia - Statin.    Hypoxemia -Resolved.   Medical decision making capacity Does not necessarily demonstrate any capacity to make his own medical decision.  APS is involved   Vitamin D  deficiency - Vitamin D  supplementation.    Dehydration - Status post intermittent IV fluid boluses.    Goals of care Previously two-physician DNR signed   2.  Increased nutritional needs -BMI of 26.26 kg/m. - Nutritional supplementation.      DVT prophylaxis: Lovenox  Code Status: DNR Family Communication: No family at bedside.  Awaiting APS guardianship. Disposition: Awaiting on APS guardianship and SNF.   Status is: Inpatient Remains inpatient appropriate because: Unsafe disposition   Consultants:  Neurology PCCM Psychiatry Palliative care   Procedures:  CT head 03/07/2024, 03/17/2024, 03/23/2024 CT C-spine 03/23/2024 Plain films of the right elbow 03/17/2024 Abdominal films 03/12/2024 Chest x-ray 03/08/2024 MRI brain 03/13/2024 EEG 03/10/2024     DVT prophylaxis: enoxaparin  (LOVENOX ) injection 40 mg Start: 03/08/24 0800      Code Status: Limited: Do not attempt resuscitation (DNR) -DNR-LIMITED -Do Not Intubate/DNI  Family Communication:   Status is: Inpatient Remains inpatient appropriate because: Ongoing safe disposition plans   PT Follow up Recs: Recommend  Snf, Barriers To Snf Placement - Toc To F/U With Patient/Family For D/C  Plans12/08/2023 1528  Subjective:  No complaints feeling okay  Examination:  General exam: Appears calm and comfortable  Respiratory system: Clear to auscultation. Respiratory effort normal. Cardiovascular system: S1 & S2 heard, RRR. No JVD, murmurs, rubs, gallops or clicks. No pedal edema. Gastrointestinal system: Abdomen is nondistended, soft and nontender. No organomegaly or masses felt. Normal bowel sounds heard. Central nervous system: Alert and oriented. No focal neurological deficits. Extremities: Symmetric 5 x 5 power. Skin: No rashes, lesions or ulcers Psychiatry: Judgement and insight appear normal. Mood & affect appropriate.                Diet Orders (From admission, onward)     Start     Ordered   03/08/24 1221  Diet Carb Modified Fluid consistency: Thin; Room service appropriate? Yes  Diet effective now       Question Answer Comment  Diet-HS Snack? Nothing   Calorie Level Medium 1600-2000   Fluid consistency: Thin   Room service appropriate? Yes      03/08/24 1220            Objective: Vitals:   04/18/24 0541 04/18/24 2019 04/19/24 0514 04/19/24 1246  BP: 108/67 104/72 99/80 116/72  Pulse: 62 68 (!) 55 (!) 59  Resp: 16 18 18 18   Temp: (!) 97.5 F (36.4 C) 98.3 F (36.8 C) 97.6 F (36.4 C) (!) 97.5 F (36.4 C)  TempSrc: Oral     SpO2: 93% 100% 96% 97%  Weight:      Height:        Intake/Output Summary (Last 24 hours) at 04/19/2024 1253 Last data filed at 04/18/2024 1300 Gross per 24 hour  Intake 120 ml  Output --  Net 120 ml   Filed Weights   03/07/24 2229 03/26/24 1715  Weight: 87.4 kg 83 kg    Scheduled Meds:  atorvastatin   20 mg Oral Daily   divalproex   625 mg Oral Q8H   DULoxetine   60 mg Oral BID   enoxaparin  (LOVENOX ) injection  40 mg Subcutaneous Q24H   hydrOXYzine   50 mg Oral QHS   metFORMIN   500 mg Oral BID WC   multivitamin with minerals  1 tablet Oral Daily   propranolol   10 mg Oral BID   valbenazine   80 mg  Oral Daily   Continuous Infusions:  Nutritional status Signs/Symptoms: estimated needs Interventions: Ensure Enlive (each supplement provides 350kcal and 20 grams of protein), MVI Body mass index is 26.26 kg/m.  Data Reviewed:   CBC: No results for input(s): WBC, NEUTROABS, HGB, HCT, MCV, PLT in the last 168 hours. Basic Metabolic Panel: No results for input(s): NA, K, CL, CO2, GLUCOSE, BUN, CREATININE, CALCIUM , MG, PHOS in the last 168 hours. GFR: Estimated Creatinine Clearance: 85.5 mL/min (by C-G formula based on SCr of 0.96 mg/dL). Liver Function Tests: No results for input(s): AST, ALT, ALKPHOS, BILITOT, PROT, ALBUMIN  in the last 168 hours. No results for input(s): LIPASE, AMYLASE in the last 168 hours. No results for input(s): AMMONIA in the last 168 hours. Coagulation Profile: No results for input(s): INR, PROTIME in the last 168 hours. Cardiac Enzymes: No results for input(s): CKTOTAL, CKMB, CKMBINDEX, TROPONINI in the last 168 hours. BNP (last 3 results) No results for input(s): PROBNP in the last 8760 hours. HbA1C: No results for input(s): HGBA1C in the last 72 hours. CBG: No results for input(s): GLUCAP in the last 168 hours.  Lipid Profile: No results for input(s): CHOL, HDL, LDLCALC, TRIG, CHOLHDL, LDLDIRECT in the last 72 hours. Thyroid  Function Tests: No results for input(s): TSH, T4TOTAL, FREET4, T3FREE, THYROIDAB in the last 72 hours. Anemia Panel: No results for input(s): VITAMINB12, FOLATE, FERRITIN, TIBC, IRON, RETICCTPCT in the last 72 hours. Sepsis Labs: No results for input(s): PROCALCITON, LATICACIDVEN in the last 168 hours.  No results found for this or any previous visit (from the past 240 hours).       Radiology Studies: No results found.         LOS: 43 days   Time spent= 35 mins    Burgess JAYSON Dare, MD [facility]  If  7PM-7AM, please contact night-coverage  04/19/2024, 12:53 PM

## 2024-04-19 NOTE — Plan of Care (Signed)

## 2024-04-19 NOTE — TOC Progression Note (Addendum)
 Transition of Care [facility]) - Progression Note    Patient Details  Name: [name] [name] MRN: [id number] Date of Birth: 1965-07-07  Transition of Care [facility]) CM/SW Contact  Doneta Glenys DASEN, RN Phone Number: 04/19/2024, 2:59 PM  Clinical Narrative:    PASRR # [id number] F, Exp. 07/17/2024, [place] MUST ID [id number] CM sent out additional referrals to LTC, Assisted Living and Group Homes.    Barriers to Discharge: SNF Pending bed offer               Expected Discharge Plan and Services                                               Social Drivers of Health (SDOH) Interventions SDOH Screenings   Food Insecurity: Patient Unable To Answer (03/07/2024)  Housing: Unknown (03/07/2024)  Transportation Needs: Patient Unable To Answer (03/07/2024)  Utilities: Patient Unable To Answer (03/07/2024)  Depression (PHQ2-9): High Risk (01/26/2024)  Social Connections: Unknown (09/22/2021)   Received from [facility]  Tobacco Use: Low Risk (03/13/2024)    Readmission Risk Interventions     No data to display

## 2024-04-20 NOTE — Progress Notes (Signed)
 PROGRESS NOTE    [name] [name]  FMW:[id number] DOB: 09-15-1965 DOA: 03/02/2024 PCP: [name], [name]    Brief Narrative:   58 year old M with PMH of type I bipolar disorder, tardive dyskinesia, PTSD, MDD, GAD, DM-2, tremor, frequent falls and HLD brought to ED by EMS on 10/18 due to talking crazy.    Per EDP note, roommate/friend reported problems with the patient having frequent falls in the house, difficulty caring for himself, erratic behavior including today where he had a very loud outburst in front of her, calling her names, and then was overheard stating he may kill himself.  She is concerned because he has access to 2 guns in the house.     Patient was boarded in the emergency room since 10/18 10/19, psychiatry consulted as patient was endorsing suicidal thoughts-IVC 10/22, psychiatry cleared for discharge. 10/24, patient was noted to be encephalopathic so admitted to the hospital.  11/4, MRI under sedation, transient hypotension and altered mental status-overnight in ICU. 11/5, clinically improved.  Sedation wore off.  Transferred back to MedSurg bed.     Has no capacity. Two-physician DNR on 03/27/2024 after discussion with palliative medicine given patient's multiple medical comorbidity. APS involved.  Waiting on placement.  Assessment & Plan:  Acute metabolic encephalopathy in patient with underlying psychiatric disorder - Felt to be multifactorial including positive underlying cognitive impairment, dehydration, delirium and iatrogenic.  CTA and MRI brain negative for acute findings.  EEG X3 negative.  TSH, B12 within normal limits.  Delirium precaution, TeleSitter   Bipolar disorder/MDD/GAD/PTSD -Patient noted to have some aggression and combativeness early on during the hospitalization which has subsequently improved.  Currently off restraints and on multiple medications.  Seen by psychiatry - Currently off restraints. - Patient seen in consultation by  psychiatry. - Cogentin  discontinued on 12/2. - Cymbalta  increased to 60 mg twice daily on 12/2. - Continue Ingrezza  80 mg daily. - Continue Depakote  625 mg 3 times daily. - Depakote  level on 12/3 noted at 62. - Continue Atarax  50 mg nightly. - Continue trazodone  and Haldol  as needed. - Appreciate psychiatry input and recommendations.   Hypotension secondary to sedation -Resolved.   Hypertension Currently on propranolol  twice daily   Non-insulin -dependent diabetes mellitus type 2 -Hemoglobin A1c 6.3%. Metformin .     Hyperlipidemia - Statin.    Hypoxemia -Resolved.   Medical decision making capacity Does not necessarily demonstrate any capacity to make his own medical decision.  APS is involved   Vitamin D  deficiency - Vitamin D  supplementation.    Dehydration - Status post intermittent IV fluid boluses.    Goals of care Previously two-physician DNR signed   35.  Increased nutritional needs -BMI of 26.26 kg/m. - Nutritional supplementation.      DVT prophylaxis: Lovenox  Code Status: DNR Family Communication: No family at bedside.  Awaiting APS guardianship. Disposition: Awaiting on APS guardianship and SNF.   Status is: Inpatient Remains inpatient appropriate because: Unsafe disposition   Consultants:  Neurology PCCM Psychiatry Palliative care   Procedures:  CT head 03/07/2024, 03/17/2024, 03/23/2024 CT C-spine 03/23/2024 Plain films of the right elbow 03/17/2024 Abdominal films 03/12/2024 Chest x-ray 03/08/2024 MRI brain 03/13/2024 EEG 03/10/2024     DVT prophylaxis: enoxaparin  (LOVENOX ) injection 40 mg Start: 03/08/24 0800      Code Status: Limited: Do not attempt resuscitation (DNR) -DNR-LIMITED -Do Not Intubate/DNI  Family Communication:   Status is: Inpatient Remains inpatient appropriate because: Ongoing safe disposition plans   PT Follow up Recs: Recommend  Snf, Barriers To Snf Placement - Toc To F/U With Patient/Family For D/C  Plans12/08/2023 1528  Subjective: Doing well no complaints   Examination:  General exam: Appears calm and comfortable  Respiratory system: Clear to auscultation. Respiratory effort normal. Cardiovascular system: S1 & S2 heard, RRR. No JVD, murmurs, rubs, gallops or clicks. No pedal edema. Gastrointestinal system: Abdomen is nondistended, soft and nontender. No organomegaly or masses felt. Normal bowel sounds heard. Central nervous system: Alert and oriented. No focal neurological deficits. Extremities: Symmetric 5 x 5 power. Skin: No rashes, lesions or ulcers Psychiatry: Judgement and insight appear normal. Mood & affect appropriate.                Diet Orders (From admission, onward)     Start     Ordered   03/08/24 1221  Diet Carb Modified Fluid consistency: Thin; Room service appropriate? Yes  Diet effective now       Question Answer Comment  Diet-HS Snack? Nothing   Calorie Level Medium 1600-2000   Fluid consistency: Thin   Room service appropriate? Yes      03/08/24 1220            Objective: Vitals:   04/19/24 2053 04/20/24 0505 04/20/24 0912 04/20/24 1205  BP: 97/69 105/73 109/77 (!) 124/96  Pulse: 60 (!) 54 (!) 55 66  Resp:  18    Temp:  97.6 F (36.4 C)  97.8 F (36.6 C)  TempSrc:  Oral    SpO2:  96%  100%  Weight:      Height:       No intake or output data in the 24 hours ending 04/20/24 1215 Filed Weights   03/07/24 2229 03/26/24 1715  Weight: 87.4 kg 83 kg    Scheduled Meds:  atorvastatin   20 mg Oral Daily   divalproex   625 mg Oral Q8H   DULoxetine   60 mg Oral BID   enoxaparin  (LOVENOX ) injection  40 mg Subcutaneous Q24H   hydrOXYzine   50 mg Oral QHS   metFORMIN   500 mg Oral BID WC   multivitamin with minerals  1 tablet Oral Daily   propranolol   10 mg Oral BID   valbenazine   80 mg Oral Daily   Continuous Infusions:  Nutritional status Signs/Symptoms: estimated needs Interventions: Ensure Enlive (each supplement provides  350kcal and 20 grams of protein), MVI Body mass index is 26.26 kg/m.  Data Reviewed:   CBC: No results for input(s): WBC, NEUTROABS, HGB, HCT, MCV, PLT in the last 168 hours. Basic Metabolic Panel: No results for input(s): NA, K, CL, CO2, GLUCOSE, BUN, CREATININE, CALCIUM , MG, PHOS in the last 168 hours. GFR: Estimated Creatinine Clearance: 85.5 mL/min (by C-G formula based on SCr of 0.96 mg/dL). Liver Function Tests: No results for input(s): AST, ALT, ALKPHOS, BILITOT, PROT, ALBUMIN  in the last 168 hours. No results for input(s): LIPASE, AMYLASE in the last 168 hours. No results for input(s): AMMONIA in the last 168 hours. Coagulation Profile: No results for input(s): INR, PROTIME in the last 168 hours. Cardiac Enzymes: No results for input(s): CKTOTAL, CKMB, CKMBINDEX, TROPONINI in the last 168 hours. BNP (last 3 results) No results for input(s): PROBNP in the last 8760 hours. HbA1C: No results for input(s): HGBA1C in the last 72 hours. CBG: No results for input(s): GLUCAP in the last 168 hours. Lipid Profile: No results for input(s): CHOL, HDL, LDLCALC, TRIG, CHOLHDL, LDLDIRECT in the last 72 hours. Thyroid  Function Tests: No results for input(s): TSH, T4TOTAL, FREET4,  T3FREE, THYROIDAB in the last 72 hours. Anemia Panel: No results for input(s): VITAMINB12, FOLATE, FERRITIN, TIBC, IRON, RETICCTPCT in the last 72 hours. Sepsis Labs: No results for input(s): PROCALCITON, LATICACIDVEN in the last 168 hours.  No results found for this or any previous visit (from the past 240 hours).       Radiology Studies: No results found.         LOS: 44 days   Time spent= 35 mins    Burgess JAYSON Dare, MD [facility]  If 7PM-7AM, please contact night-coverage  04/20/2024, 12:15 PM

## 2024-04-20 NOTE — Plan of Care (Signed)

## 2024-04-20 NOTE — Progress Notes (Signed)
 Occupational Therapy Treatment Patient Details Name: [name] [name] MRN: [id number] DOB: 1965/08/08 Today's Date: 04/20/2024   History of present illness Pt is a 58 yr old male presenting on 10/24 for fall and chronic back pain, change in mental status due to acute delirium, and admitted from ED with acute metabolic encephalopathy. PMH includes: bipolar disorder type 1, tardive dyskinesia, PTSD, MDD, CAD, DM2, OA, tremors.   OT comments  The pt was calm and motivated to participate in the session. His overall orientation was improved today, as he correctly stated today's date and that he was in the hospital. He continues to require increased assist for lower body dressing, given difficulty flexing at the hips to perform, due to chronic back pain. He subsequently required CGA to min assist for functional  transfers and functional ambulation using a RW. He is making gradual functional progress. Continue OT plan of care. Patient will benefit from continued inpatient follow up therapy, <3 hours/day.       If plan is discharge home, recommend the following:  A little help with walking and/or transfers;A lot of help with bathing/dressing/bathroom;Assistance with cooking/housework;Direct supervision/assist for medications management;Direct supervision/assist for financial management;Assist for transportation;Help with stairs or ramp for entrance   Equipment Recommendations  BSC/3in1    Recommendations for Other Services      Precautions / Restrictions Precautions Precautions: Fall Restrictions Weight Bearing Restrictions Per Provider Order: No Other Position/Activity Restrictions: chronic back pain, history of Parkinson's disease       Mobility Bed Mobility Overal bed mobility: Needs Assistance Bed Mobility: Supine to Sit     Supine to sit: Min assist, HOB elevated, Used rails Sit to supine: Supervision        Transfers Overall transfer level: Needs assistance Equipment used:  Rolling walker (2 wheels) Transfers: Sit to/from Stand Sit to Stand: Min assist, From elevated surface          Balance Overall balance assessment: Needs assistance     Sitting balance - Comments: static sitting-good. dynamic sitting-fair+       Standing balance comment: CGA to min assist           ADL either performed or assessed with clinical judgement   ADL Overall ADL's : Needs assistance/impaired        Lower Body Dressing: Maximal assistance Lower Body Dressing Details (indicate cue type and reason): The pt reports difficulty with flexing at the hips to donn and doff socks at his baseline, given chronic back pain. As such, he required increased assist to donn his socks seated EOB.            Vision Baseline Vision/History: 1 Wears glasses           Communication Communication Communication: No apparent difficulties   Cognition Arousal: Alert Behavior During Therapy: Flat affect, WFL for tasks assessed/performed Cognition: Cognition impaired   Orientation impairments: Situation Awareness: Online awareness impaired Memory impairment (select all impairments): Short-term memory, Working memory Attention impairment (select first level of impairment): Divided attention Executive functioning impairment (select all impairments): Reasoning, Problem solving      Following commands: Impaired Following commands impaired: Follows one step commands with increased time      Cueing   Cueing Techniques: Verbal cues, Gestural cues             Pertinent Vitals/ Pain       Pain Assessment Pain Assessment: No/denies pain   Frequency  Min 2X/week        Progress Toward  Goals  OT Goals(current goals can now be found in the care plan section)  Progress towards OT goals: Progressing toward goals  Acute Rehab OT Goals Patient Stated Goal: to go home OT Goal Formulation: With patient Time For Goal Achievement: 05/06/24 Potential to Achieve Goals: Good   Plan         AM-PAC OT 6 Clicks Daily Activity     Outcome Measure   Help from another person eating meals?: A Little Help from another person taking care of personal grooming?: A Little Help from another person toileting, which includes using toliet, bedpan, or urinal?: A Little Help from another person bathing (including washing, rinsing, drying)?: A Lot Help from another person to put on and taking off regular upper body clothing?: A Little Help from another person to put on and taking off regular lower body clothing?: A Lot 6 Click Score: 16    End of Session Equipment Utilized During Treatment: Gait belt;Rolling walker (2 wheels)  OT Visit Diagnosis: Other abnormalities of gait and mobility (R26.89);Unsteadiness on feet (R26.81);Muscle weakness (generalized) (M62.81);History of falling (Z91.81);Other symptoms and signs involving cognitive function   Activity Tolerance Patient tolerated treatment well   Patient Left in bed;with call bell/phone within reach;with bed alarm set   Nurse Communication Mobility status        Time: 1259-1311 OT Time Calculation (min): 12 min  Charges: OT General Charges $OT Visit: 1 Visit OT Treatments $Therapeutic Activity: 8-22 mins     [name], OTR/L 04/20/2024, 2:29 PM

## 2024-04-20 NOTE — Plan of Care (Signed)

## 2024-04-20 NOTE — TOC Progression Note (Signed)
 Transition of Care [facility]) - Progression Note    Patient Details  Name: [name] [name] MRN: [id number] Date of Birth: November 15, 1965  Transition of [facility]) CM/SW Contact  Doneta Glenys DASEN, RN Phone Number: 04/20/2024, 11:29 AM  Clinical Narrative:    No bed offers.     Barriers to Discharge: SNF Pending bed offer               Expected Discharge Plan and Services                                               Social Drivers of Health (SDOH) Interventions SDOH Screenings   Food Insecurity: Patient Unable To Answer (03/07/2024)  Housing: Unknown (03/07/2024)  Transportation Needs: Patient Unable To Answer (03/07/2024)  Utilities: Patient Unable To Answer (03/07/2024)  Depression (PHQ2-9): High Risk (01/26/2024)  Social Connections: Unknown (09/22/2021)   Received from [facility]  Tobacco Use: Low Risk (03/13/2024)    Readmission Risk Interventions     No data to display

## 2024-04-21 DIAGNOSIS — G9340 Encephalopathy, unspecified: Secondary | ICD-10-CM | POA: Diagnosis not present

## 2024-04-21 NOTE — Progress Notes (Signed)
 PROGRESS NOTE    [name] [name]  FMW:[id number] DOB: 1965/11/06 DOA: 03/02/2024 PCP: [name], [name]    Brief Narrative:   58 year old M with PMH of type I bipolar disorder, tardive dyskinesia, PTSD, MDD, GAD, DM-2, tremor, frequent falls and HLD brought to ED by EMS on 10/18 due to talking crazy.    Per EDP note, roommate/friend reported problems with the patient having frequent falls in the house, difficulty caring for himself, erratic behavior including today where he had a very loud outburst in front of her, calling her names, and then was overheard stating he may kill himself.  She is concerned because he has access to 2 guns in the house.     Patient was boarded in the emergency room since 10/18 10/19, psychiatry consulted as patient was endorsing suicidal thoughts-IVC 10/22, psychiatry cleared for discharge. 10/24, patient was noted to be encephalopathic so admitted to the hospital.  11/4, MRI under sedation, transient hypotension and altered mental status-overnight in ICU. 11/5, clinically improved.  Sedation wore off.  Transferred back to MedSurg bed.     Has no capacity. Two-physician DNR on 03/27/2024 after discussion with palliative medicine given patient's multiple medical comorbidity. APS involved.  Waiting on placement.  Assessment & Plan:  Acute metabolic encephalopathy in patient with underlying psychiatric disorder - Felt to be multifactorial including positive underlying cognitive impairment, dehydration, delirium and iatrogenic.  CTA and MRI brain negative for acute findings.  EEG X3 negative.  TSH, B12 within normal limits.  Delirium precaution, TeleSitter   Bipolar disorder/MDD/GAD/PTSD -Patient noted to have some aggression and combativeness early on during the hospitalization which has subsequently improved.  Currently off restraints and on multiple medications.  Seen by psychiatry - Currently off restraints. - Patient seen in consultation by  psychiatry. - Cogentin  discontinued on 12/2. - Cymbalta  increased to 60 mg twice daily on 12/2. - Continue Ingrezza  80 mg daily. - Continue Depakote  625 mg 3 times daily. - Depakote  level on 12/3 noted at 62. - Continue Atarax  50 mg nightly. - Continue trazodone  and Haldol  as needed. - Appreciate psychiatry input and recommendations.   Hypotension secondary to sedation -Resolved.   Hypertension Currently on propranolol  twice daily   Non-insulin -dependent diabetes mellitus type 2 -Hemoglobin A1c 6.3%. Metformin .     Hyperlipidemia - Statin.    Hypoxemia -Resolved.   Medical decision making capacity Does not necessarily demonstrate any capacity to make his own medical decision.  APS is involved   Vitamin D  deficiency - Vitamin D  supplementation.    Dehydration - Status post intermittent IV fluid boluses.    Goals of care Previously two-physician DNR signed   4.  Increased nutritional needs -BMI of 26.26 kg/m. - Nutritional supplementation.      DVT prophylaxis: Lovenox  Code Status: DNR Family Communication: No family at bedside.  Awaiting APS guardianship. Disposition: Awaiting on APS guardianship and SNF.   Status is: Inpatient Remains inpatient appropriate because: Unsafe disposition   Consultants:  Neurology PCCM Psychiatry Palliative care   Procedures:  CT head 03/07/2024, 03/17/2024, 03/23/2024 CT C-spine 03/23/2024 Plain films of the right elbow 03/17/2024 Abdominal films 03/12/2024 Chest x-ray 03/08/2024 MRI brain 03/13/2024 EEG 03/10/2024     DVT prophylaxis: enoxaparin  (LOVENOX ) injection 40 mg Start: 03/08/24 0800      Code Status: Limited: Do not attempt resuscitation (DNR) -DNR-LIMITED -Do Not Intubate/DNI  Family Communication:   Status is: Inpatient Remains inpatient appropriate because: Ongoing safe disposition plans   PT Follow up Recs: Recommend  Snf, Barriers To Snf Placement - Toc To F/U With Patient/Family For D/C  Plans12/08/2023 1528  Subjective: Doing well no complaints   Examination:  General exam: Appears calm and comfortable  Respiratory system: Clear to auscultation. Respiratory effort normal. Cardiovascular system: S1 & S2 heard, RRR. No JVD, murmurs, rubs, gallops or clicks. No pedal edema. Gastrointestinal system: Abdomen is nondistended, soft and nontender. No organomegaly or masses felt. Normal bowel sounds heard. Central nervous system: Alert and oriented. No focal neurological deficits. Extremities: Symmetric 5 x 5 power. Skin: No rashes, lesions or ulcers Psychiatry: Judgement and insight appear normal. Mood & affect appropriate.                Diet Orders (From admission, onward)     Start     Ordered   03/08/24 1221  Diet Carb Modified Fluid consistency: Thin; Room service appropriate? Yes  Diet effective now       Question Answer Comment  Diet-HS Snack? Nothing   Calorie Level Medium 1600-2000   Fluid consistency: Thin   Room service appropriate? Yes      03/08/24 1220            Objective: Vitals:   04/20/24 1205 04/20/24 2009 04/21/24 0514 04/21/24 0931  BP: (!) 124/96 113/71 109/69 103/85  Pulse: 66 67 (!) 53 (!) 58  Resp:  18 18   Temp: 97.8 F (36.6 C) 98.1 F (36.7 C) 97.6 F (36.4 C)   TempSrc:  Oral Oral   SpO2: 100% 94% 95%   Weight:      Height:        Intake/Output Summary (Last 24 hours) at 04/21/2024 1128 Last data filed at 04/20/2024 1130 Gross per 24 hour  Intake 120 ml  Output --  Net 120 ml   Filed Weights   03/07/24 2229 03/26/24 1715  Weight: 87.4 kg 83 kg    Scheduled Meds:  atorvastatin   20 mg Oral Daily   divalproex   625 mg Oral Q8H   DULoxetine   60 mg Oral BID   enoxaparin  (LOVENOX ) injection  40 mg Subcutaneous Q24H   hydrOXYzine   50 mg Oral QHS   metFORMIN   500 mg Oral BID WC   multivitamin with minerals  1 tablet Oral Daily   propranolol   10 mg Oral BID   valbenazine   80 mg Oral Daily   Continuous  Infusions:  Nutritional status Signs/Symptoms: estimated needs Interventions: Ensure Enlive (each supplement provides 350kcal and 20 grams of protein), MVI Body mass index is 26.26 kg/m.  Data Reviewed:   CBC: No results for input(s): WBC, NEUTROABS, HGB, HCT, MCV, PLT in the last 168 hours. Basic Metabolic Panel: No results for input(s): NA, K, CL, CO2, GLUCOSE, BUN, CREATININE, CALCIUM , MG, PHOS in the last 168 hours. GFR: CrCl cannot be calculated (Patient's most recent lab result is older than the maximum 21 days allowed.). Liver Function Tests: No results for input(s): AST, ALT, ALKPHOS, BILITOT, PROT, ALBUMIN  in the last 168 hours. No results for input(s): LIPASE, AMYLASE in the last 168 hours. No results for input(s): AMMONIA in the last 168 hours. Coagulation Profile: No results for input(s): INR, PROTIME in the last 168 hours. Cardiac Enzymes: No results for input(s): CKTOTAL, CKMB, CKMBINDEX, TROPONINI in the last 168 hours. BNP (last 3 results) No results for input(s): PROBNP in the last 8760 hours. HbA1C: No results for input(s): HGBA1C in the last 72 hours. CBG: No results for input(s): GLUCAP in the last 168 hours. Lipid  Profile: No results for input(s): CHOL, HDL, LDLCALC, TRIG, CHOLHDL, LDLDIRECT in the last 72 hours. Thyroid  Function Tests: No results for input(s): TSH, T4TOTAL, FREET4, T3FREE, THYROIDAB in the last 72 hours. Anemia Panel: No results for input(s): VITAMINB12, FOLATE, FERRITIN, TIBC, IRON, RETICCTPCT in the last 72 hours. Sepsis Labs: No results for input(s): PROCALCITON, LATICACIDVEN in the last 168 hours.  No results found for this or any previous visit (from the past 240 hours).       Radiology Studies: No results found.         LOS: 45 days   Time spent= 35 mins    Burgess JAYSON Dare, MD [facility]  If 7PM-7AM,  please contact night-coverage  04/21/2024, 11:28 AM

## 2024-04-21 NOTE — Plan of Care (Signed)

## 2024-04-21 NOTE — Plan of Care (Signed)
   Problem: Health Behavior/Discharge Planning: Goal: Ability to manage health-related needs will improve Outcome: Progressing   Problem: Clinical Measurements: Goal: Ability to maintain clinical measurements within normal limits will improve Outcome: Progressing Goal: Will remain free from infection Outcome: Progressing

## 2024-04-22 NOTE — Plan of Care (Signed)

## 2024-04-22 NOTE — Plan of Care (Signed)
°  Problem: Coping: Goal: Level of anxiety will decrease Outcome: Progressing   Problem: Elimination: Goal: Will not experience complications related to bowel motility Outcome: Progressing Goal: Will not experience complications related to urinary retention Outcome: Progressing   Problem: Pain Managment: Goal: General experience of comfort will improve and/or be controlled Outcome: Progressing   Problem: Safety: Goal: Ability to remain free from injury will improve Outcome: Progressing   Problem: Skin Integrity: Goal: Risk for impaired skin integrity will decrease Outcome: Progressing   Problem: Education: Goal: Ability to describe self-care measures that may prevent or decrease complications (Diabetes Survival Skills Education) will improve Outcome: Progressing Goal: Individualized Educational Video(s) Outcome: Progressing   Problem: Coping: Goal: Ability to adjust to condition or change in health will improve Outcome: Progressing   Problem: Fluid Volume: Goal: Ability to maintain a balanced intake and output will improve Outcome: Progressing   Problem: Health Behavior/Discharge Planning: Goal: Ability to identify and utilize available resources and services will improve Outcome: Progressing Goal: Ability to manage health-related needs will improve Outcome: Progressing   Problem: Nutritional: Goal: Maintenance of adequate nutrition will improve Outcome: Progressing Goal: Progress toward achieving an optimal weight will improve Outcome: Progressing   Problem: Skin Integrity: Goal: Risk for impaired skin integrity will decrease Outcome: Progressing   Problem: Tissue Perfusion: Goal: Adequacy of tissue perfusion will improve Outcome: Progressing

## 2024-04-22 NOTE — Progress Notes (Signed)
 PROGRESS NOTE    [name] [name]  FMW:[id number] DOB: February 28, 1966 DOA: 03/02/2024 PCP: [name], [name]   Brief Narrative:  58 year old M with PMH of type I bipolar disorder, tardive dyskinesia, PTSD, MDD, GAD, DM-2, tremor, frequent falls and hyperlipidemia was brought to ED by EMS on 10/18 due to talking crazy and altered mental status.    Per EDP note, roommate/friend reported problems with the patient having frequent falls in the house, difficulty caring for himself, erratic behavior including today where he had a very loud outburst in front of her, calling her names, and then was overheard stating he may kill himself.  She is concerned because he has access to 2 guns in the house.     Patient boarded in the emergency room since 10/18. 10/19, psychiatry consulted as patient was endorsing suicidal thoughts-IVC 10/22, psychiatry cleared for discharge. 10/24, patient was noted to be encephalopathic so admitted to the hospital.  11/4, MRI under sedation, transient hypotension and altered mental status-overnight in ICU. 11/5, clinically improved.  Sedation wore off.  Transferred back to MedSurg bed.     Has no capacity. Two-physician DNR on 03/27/2024 after discussion with palliative medicine given patient's multiple medical comorbidity. APS involved.  Waiting on placement.  Assessment & Plan:   Acute metabolic encephalopathy in patient with underlying psychiatric disorder -Felt to be multifactorial including positive underlying cognitive impairment, dehydration, delirium and iatrogenic. CTA and MRI brain negative for acute findings. EEG X3 negative. TSH, B12 within normal limits. Delirium/fall precautions   Bipolar disorder/MDD/GAD/PTSD - Psychiatry has seen the patient during this hospitalization and has signed off.  Continue Depakote , duloxetine , hydroxyzine , propranolol  and valbenazine .  Outpatient follow-up with psychiatry.  Mental status currently stable.  Continue Haldol  as  needed  Hypotension secondary to sedation--resolved  Hyperlipidemia Hypertension -Continue statin and propranolol   Diabetes mellitus type 2 - A1c 6.3.  Continue metformin   Hypoxemia - Resolved  Medical decision making -Does not necessarily demonstrate any capacity to make his own medical decision. APS is involved   Goals of care - Previously to physicians signed DNR.  Palliative care also was involved in the care and followed the patient during this hospitalization   DVT prophylaxis: Lovenox  Code Status: DNR Family Communication: None at bedside Disposition Plan: Status is: Inpatient Remains inpatient appropriate because: Of severity of illness.  Awaiting on APS guardianship and SNF placement.    Consultants: Neurology/PCCM/psychiatry/palliative care  Procedures: As discussed above  Antimicrobials: None   Subjective: Patient seen and examined at bedside.  Poor historian.  No seizures, agitation or vomiting reported.  Objective: Vitals:   04/21/24 1918 04/21/24 2159 04/22/24 0546 04/22/24 0929  BP: 110/73 110/73 109/72 109/72  Pulse: 70 70 (!) 58 (!) 55  Resp: 15  18   Temp: 97.9 F (36.6 C)  (!) 97.3 F (36.3 C)   TempSrc: Oral  Oral   SpO2: 97%  95%   Weight:      Height:       No intake or output data in the 24 hours ending 04/22/24 1052 Filed Weights   03/07/24 2229 03/26/24 1715  Weight: 87.4 kg 83 kg    Examination:  General exam: Appears calm and comfortable.  Poor historian.  Slow to respond.  Chronically ill and deconditioned looking. Respiratory system: Bilateral decreased breath sounds at bases, no wheezing Cardiovascular system: S1 & S2 heard, mildly bradycardic Gastrointestinal system: Abdomen is nondistended, soft and nontender. Normal bowel sounds heard. Extremities: No cyanosis, clubbing, edema  Data Reviewed: I have personally reviewed following labs and imaging studies  CBC: No results for input(s): WBC, NEUTROABS, HGB,  HCT, MCV, PLT in the last 168 hours. Basic Metabolic Panel: No results for input(s): NA, K, CL, CO2, GLUCOSE, BUN, CREATININE, CALCIUM , MG, PHOS in the last 168 hours. GFR: CrCl cannot be calculated (Patient's most recent lab result is older than the maximum 21 days allowed.). Liver Function Tests: No results for input(s): AST, ALT, ALKPHOS, BILITOT, PROT, ALBUMIN  in the last 168 hours. No results for input(s): LIPASE, AMYLASE in the last 168 hours. No results for input(s): AMMONIA in the last 168 hours. Coagulation Profile: No results for input(s): INR, PROTIME in the last 168 hours. Cardiac Enzymes: No results for input(s): CKTOTAL, CKMB, CKMBINDEX, TROPONINI in the last 168 hours. BNP (last 3 results) No results for input(s): PROBNP in the last 8760 hours. HbA1C: No results for input(s): HGBA1C in the last 72 hours. CBG: No results for input(s): GLUCAP in the last 168 hours. Lipid Profile: No results for input(s): CHOL, HDL, LDLCALC, TRIG, CHOLHDL, LDLDIRECT in the last 72 hours. Thyroid  Function Tests: No results for input(s): TSH, T4TOTAL, FREET4, T3FREE, THYROIDAB in the last 72 hours. Anemia Panel: No results for input(s): VITAMINB12, FOLATE, FERRITIN, TIBC, IRON, RETICCTPCT in the last 72 hours. Sepsis Labs: No results for input(s): PROCALCITON, LATICACIDVEN in the last 168 hours.  No results found for this or any previous visit (from the past 240 hours).       Radiology Studies: No results found.      Scheduled Meds:  atorvastatin   20 mg Oral Daily   divalproex   625 mg Oral Q8H   DULoxetine   60 mg Oral BID   enoxaparin  (LOVENOX ) injection  40 mg Subcutaneous Q24H   hydrOXYzine   50 mg Oral QHS   metFORMIN   500 mg Oral BID WC   multivitamin with minerals  1 tablet Oral Daily   propranolol   10 mg Oral BID   valbenazine   80 mg Oral Daily   Continuous  Infusions:        Sophie Mao, MD [facility] 04/22/2024, 10:52 AM

## 2024-04-23 NOTE — Progress Notes (Signed)
 PROGRESS NOTE    [name] [name]  FMW:[id number] DOB: 1966-03-25 DOA: 03/02/2024 PCP: [name], [name]   Brief Narrative:  58 year old M with PMH of type I bipolar disorder, tardive dyskinesia, PTSD, MDD, GAD, DM-2, tremor, frequent falls and hyperlipidemia was brought to ED by EMS on 10/18 due to talking crazy and altered mental status.    Per EDP note, roommate/friend reported problems with the patient having frequent falls in the house, difficulty caring for himself, erratic behavior including today where he had a very loud outburst in front of her, calling her names, and then was overheard stating he may kill himself.  She is concerned because he has access to 2 guns in the house.     Patient boarded in the emergency room since 10/18. 10/19, psychiatry consulted as patient was endorsing suicidal thoughts-IVC 10/22, psychiatry cleared for discharge. 10/24, patient was noted to be encephalopathic so admitted to the hospital.  11/4, MRI under sedation, transient hypotension and altered mental status-overnight in ICU. 11/5, clinically improved.  Sedation wore off.  Transferred back to MedSurg bed.     Has no capacity. Two-physician DNR on 03/27/2024 after discussion with palliative medicine given patient's multiple medical comorbidity. APS involved.  Waiting on placement.  Assessment & Plan:   Acute metabolic encephalopathy in patient with underlying psychiatric disorder -Felt to be multifactorial including positive underlying cognitive impairment, dehydration, delirium and iatrogenic. CTA and MRI brain negative for acute findings. EEG X3 negative. TSH, B12 within normal limits. Delirium/fall precautions   Bipolar disorder/MDD/GAD/PTSD - Psychiatry has seen the patient during this hospitalization and has signed off.  Continue Depakote , duloxetine , hydroxyzine , propranolol  and valbenazine .  Outpatient follow-up with psychiatry.  Mental status currently stable.  Continue Haldol  as  needed  Hypotension secondary to sedation--resolved  Hyperlipidemia Hypertension -Continue statin and propranolol   Diabetes mellitus type 2 - A1c 6.3.  Continue metformin   Hypoxemia - Resolved  Medical decision making -Does not necessarily demonstrate any capacity to make his own medical decision. APS is involved   Goals of care - Previously to physicians signed DNR.  Palliative care also was involved in the care and followed the patient during this hospitalization   DVT prophylaxis: Lovenox  Code Status: DNR Family Communication: None at bedside Disposition Plan: Status is: Inpatient Remains inpatient appropriate because: Of severity of illness.  Awaiting on APS guardianship and SNF placement.    Consultants: Neurology/PCCM/psychiatry/palliative care  Procedures: As discussed above  Antimicrobials: None   Subjective: Patient seen and examined at bedside.  Poor historian.  No fever, vomiting or agitation reported. Objective: Vitals:   04/22/24 0929 04/22/24 1620 04/22/24 1935 04/23/24 0607  BP: 109/72 128/72 107/77 102/68  Pulse: (!) 55 69 74 (!) 53  Resp:  15 16 16   Temp:  97.7 F (36.5 C) (!) 97.5 F (36.4 C) 98 F (36.7 C)  TempSrc:   Oral Oral  SpO2:  96% 97% 94%  Weight:      Height:        Intake/Output Summary (Last 24 hours) at 04/23/2024 1025 Last data filed at 04/23/2024 0000 Gross per 24 hour  Intake 120 ml  Output 350 ml  Net -230 ml   Filed Weights   03/07/24 2229 03/26/24 1715  Weight: 87.4 kg 83 kg    Examination:  General: On room air.  No distress.  Remains slow to respond and a very poor historian. respiratory: Decreased breath sounds at bases bilaterally with some crackles CVS: Currently rate controlled; S1-S2 heard  abdominal: Soft, nontender, slightly distended, no organomegaly; normal bowel sounds are heard  extremities: Trace lower extremity edema; no clubbing.     Data Reviewed: I have personally reviewed following  labs and imaging studies  CBC: No results for input(s): WBC, NEUTROABS, HGB, HCT, MCV, PLT in the last 168 hours. Basic Metabolic Panel: No results for input(s): NA, K, CL, CO2, GLUCOSE, BUN, CREATININE, CALCIUM , MG, PHOS in the last 168 hours. GFR: CrCl cannot be calculated (Patient's most recent lab result is older than the maximum 21 days allowed.). Liver Function Tests: No results for input(s): AST, ALT, ALKPHOS, BILITOT, PROT, ALBUMIN  in the last 168 hours. No results for input(s): LIPASE, AMYLASE in the last 168 hours. No results for input(s): AMMONIA in the last 168 hours. Coagulation Profile: No results for input(s): INR, PROTIME in the last 168 hours. Cardiac Enzymes: No results for input(s): CKTOTAL, CKMB, CKMBINDEX, TROPONINI in the last 168 hours. BNP (last 3 results) No results for input(s): PROBNP in the last 8760 hours. HbA1C: No results for input(s): HGBA1C in the last 72 hours. CBG: No results for input(s): GLUCAP in the last 168 hours. Lipid Profile: No results for input(s): CHOL, HDL, LDLCALC, TRIG, CHOLHDL, LDLDIRECT in the last 72 hours. Thyroid  Function Tests: No results for input(s): TSH, T4TOTAL, FREET4, T3FREE, THYROIDAB in the last 72 hours. Anemia Panel: No results for input(s): VITAMINB12, FOLATE, FERRITIN, TIBC, IRON, RETICCTPCT in the last 72 hours. Sepsis Labs: No results for input(s): PROCALCITON, LATICACIDVEN in the last 168 hours.  No results found for this or any previous visit (from the past 240 hours).       Radiology Studies: No results found.      Scheduled Meds:  atorvastatin   20 mg Oral Daily   divalproex   625 mg Oral Q8H   DULoxetine   60 mg Oral BID   enoxaparin  (LOVENOX ) injection  40 mg Subcutaneous Q24H   hydrOXYzine   50 mg Oral QHS   metFORMIN   500 mg Oral BID WC   multivitamin with minerals  1 tablet Oral Daily    propranolol   10 mg Oral BID   valbenazine   80 mg Oral Daily   Continuous Infusions:        Sophie Mao, MD [facility] 04/23/2024, 10:25 AM

## 2024-04-23 NOTE — Plan of Care (Signed)

## 2024-04-23 NOTE — Progress Notes (Signed)
 PT Cancellation Note  Patient Details Name: [name] [name] MRN: [id number] DOB: 1965-07-21   Cancelled Treatment:    Reason Eval/Treat Not Completed: Pain limiting ability to participate (pt reports he's not up to walking 2* R knee pain. RN notified of pt request for tylenol . Will follow.)   [name] PT 04/23/2024  Acute Rehabilitation Services  Office [phone]

## 2024-04-23 NOTE — Progress Notes (Signed)
 Daily Progress Note   Patient Name: [name] [name]       Date: 04/23/2024 DOB: 1965-06-06  Age: 58 y.o. MRN#: [id number] Attending Physician: Cheryle Page, MD Primary Care Physician: [name], [name] Admit Date: 03/02/2024 Length of Stay: 47 days  Reason for Consultation/Follow-up: Establishing goals of care  Subjective:   Reviewed EMR including recent documentation from hospitalist, TOC, and psychiatry.  TOC still assisting with disposition.  APS involved to assist with decision making and guardianship.  Psychiatry has provided recommendations regarding medication management.  Presented to bedside to see patient.  Patient laying calmly in bed not very interactive.     Objective:   Vital Signs:  BP 102/68 (BP Location: Right Arm)   Pulse (!) 53   Temp 98 F (36.7 C) (Oral)   Resp 16   Ht 5' 10 (1.778 m)   Wt 83 kg   SpO2 94%   BMI 26.26 kg/m   Physical Exam: General: Awake, calmly laying in bed Cardiovascular: Bradycardia noted Respiratory: no increased work of breathing noted, not in respiratory distress Abdomen: not distended Neuro: Awake  Assessment & Plan:   Assessment: Patient is a 58 year old male with a past medical history of type I bipolar disorder, tardive dyskinesia, PTSD, MDD, GAD, tremor, frequent falls, and hyperlipidemia who was brought to the ED on 02/25/2023 by EMS for talking crazy.  Patient's roommate/friend reported patient was having frequent falls, erratic behavior, and difficulty caring for himself at home; roommate had also overheard patient stating that he may kill himself which was concerning since he has access to 2 guns in the house.  Initially patient was endorsing suicidal thoughts and IVC was obtained by psychiatry though psychiatry cleared for discharge on 02/29/2024.  Patient then noted to be encephalopathic and so was admitted to the hospital on 03/02/2024.  Hospitalization further complicated by hypotension requiring ICU admission  though has currently resolved.  TOC involved to seek guardianship for patient as no family or friends willing to make decisions on patient's behalf.  Palliative medicine team consulted to assist with complex medical decision making.   Recommendations/Plan: # Complex medical decision making/goals of care:   - Patient unable to participate in complex medical decision making due to underlying medical status.  Patient does not have friends or family willing to make decisions on his behalf.  Psychiatry continuing appropriate adjustment of medications for psychiatric illness management.  TOC working with APS for guardianship and placement.  Palliative medicine team will continue to follow along peripherally though please reach out if acute PMT needs arise.                   -Already discussed with hospitalist about two-physician DNR based on patient's multiple medical comorbidities.  Continuing medically appropriate and reasonable medical interventions at this time and these are being done according to excepted practice and standards of care.  In the event of cardiac or respiratory arrest, an attempt at resuscitation would be unsuccessful in returning this patient to an already fragile state of health and cause unnecessary suffering in a patient with these multiple underlying medical comorbidities. Support placing a maintaining a DNR order and support the care plan as outlined by the primary service.   # Psycho-social/Spiritual Support:  - TOC seeking guardianship/working with DSS   Medication history reviewed - is on Atarax , Haldol , Depakote , Cymbalta .   # Discharge Planning:  SNF pending bed offer. TOC on board and continues to follow closely.   Thank you  for allowing the palliative care team to participate in the care [name] [name].  Low mdm [name] Bosket md Palliative Care Provider PMT # [phone]  If patient remains symptomatic despite maximum doses, please call PMT at [phone] between 0700  and 1900. Outside of these hours, please call attending, as PMT does not have night coverage.

## 2024-04-24 ENCOUNTER — Telehealth: Payer: Self-pay

## 2024-04-24 NOTE — Progress Notes (Signed)
 PROGRESS NOTE    [name] [name]  FMW:[id number] DOB: 1965-07-22 DOA: 03/02/2024 PCP: [name], [name]   Brief Narrative:  58 year old M with PMH of type I bipolar disorder, tardive dyskinesia, PTSD, MDD, GAD, DM-2, tremor, frequent falls and hyperlipidemia was brought to ED by EMS on 10/18 due to talking crazy and altered mental status.    Per EDP note, roommate/friend reported problems with the patient having frequent falls in the house, difficulty caring for himself, erratic behavior including today where he had a very loud outburst in front of her, calling her names, and then was overheard stating he may kill himself.  She is concerned because he has access to 2 guns in the house.     Patient boarded in the emergency room since 10/18. 10/19, psychiatry consulted as patient was endorsing suicidal thoughts-IVC 10/22, psychiatry cleared for discharge. 10/24, patient was noted to be encephalopathic so admitted to the hospital.  11/4, MRI under sedation, transient hypotension and altered mental status-overnight in ICU. 11/5, clinically improved.  Sedation wore off.  Transferred back to MedSurg bed.     Has no capacity. Two-physician DNR on 03/27/2024 after discussion with palliative medicine given patient's multiple medical comorbidity. APS involved.  Waiting on placement.  Assessment & Plan:   Acute metabolic encephalopathy in patient with underlying psychiatric disorder -Felt to be multifactorial including positive underlying cognitive impairment, dehydration, delirium and iatrogenic. CTA and MRI brain negative for acute findings. EEG X3 negative. TSH, B12 within normal limits. Delirium/fall precautions   Bipolar disorder/MDD/GAD/PTSD - Psychiatry has seen the patient during this hospitalization and has signed off.  Continue Depakote , duloxetine , hydroxyzine , propranolol  and valbenazine .  Outpatient follow-up with psychiatry.  Mental status currently stable.  Continue Haldol  as  needed  Hypotension secondary to sedation--resolved  Hyperlipidemia Hypertension -Continue statin and propranolol   Diabetes mellitus type 2 - A1c 6.3.  Continue metformin   Hypoxemia - Resolved  Medical decision making -Does not necessarily demonstrate any capacity to make his own medical decision. APS is involved   Goals of care - Previously to physicians signed DNR.  Palliative care also was involved in the care and followed the patient during this hospitalization   DVT prophylaxis: Lovenox  Code Status: DNR Family Communication: None at bedside Disposition Plan: Status is: Inpatient Remains inpatient appropriate because: Of severity of illness.  Awaiting on APS guardianship and SNF placement.    Consultants: Neurology/PCCM/psychiatry/palliative care  Procedures: As discussed above  Antimicrobials: None   Subjective: Patient seen and examined at bedside.  Poor historian.  No agitation, seizures or vomiting reported  objective: Vitals:   04/23/24 0607 04/23/24 1340 04/23/24 2123 04/24/24 0506  BP: 102/68 109/69 109/69 (!) 151/115  Pulse: (!) 53 65 65 (!) 49  Resp: 16 16    Temp: 98 F (36.7 C) (!) 97.3 F (36.3 C)    TempSrc: Oral Oral    SpO2: 94% 96%  100%  Weight:      Height:        Intake/Output Summary (Last 24 hours) at 04/24/2024 0813 Last data filed at 04/23/2024 1030 Gross per 24 hour  Intake --  Output 400 ml  Net -400 ml   Filed Weights   03/07/24 2229 03/26/24 1715  Weight: 87.4 kg 83 kg    Examination:  General: No acute distress.  Remains on room air.  Extremely slow to respond. Very poor historian. respiratory: Bilateral decreased breath sounds at bases with scattered crackles CVS: S1 and S2 are heard; rate mostly  controlled abdominal: Soft, nontender, remains mildly distended; no organomegaly; bowel sounds normally heard  extremities: No cyanosis; mild lower extremity edema present  Data Reviewed: I have personally reviewed  following labs and imaging studies  CBC: No results for input(s): WBC, NEUTROABS, HGB, HCT, MCV, PLT in the last 168 hours. Basic Metabolic Panel: No results for input(s): NA, K, CL, CO2, GLUCOSE, BUN, CREATININE, CALCIUM , MG, PHOS in the last 168 hours. GFR: CrCl cannot be calculated (Patient's most recent lab result is older than the maximum 21 days allowed.). Liver Function Tests: No results for input(s): AST, ALT, ALKPHOS, BILITOT, PROT, ALBUMIN  in the last 168 hours. No results for input(s): LIPASE, AMYLASE in the last 168 hours. No results for input(s): AMMONIA in the last 168 hours. Coagulation Profile: No results for input(s): INR, PROTIME in the last 168 hours. Cardiac Enzymes: No results for input(s): CKTOTAL, CKMB, CKMBINDEX, TROPONINI in the last 168 hours. BNP (last 3 results) No results for input(s): PROBNP in the last 8760 hours. HbA1C: No results for input(s): HGBA1C in the last 72 hours. CBG: No results for input(s): GLUCAP in the last 168 hours. Lipid Profile: No results for input(s): CHOL, HDL, LDLCALC, TRIG, CHOLHDL, LDLDIRECT in the last 72 hours. Thyroid  Function Tests: No results for input(s): TSH, T4TOTAL, FREET4, T3FREE, THYROIDAB in the last 72 hours. Anemia Panel: No results for input(s): VITAMINB12, FOLATE, FERRITIN, TIBC, IRON, RETICCTPCT in the last 72 hours. Sepsis Labs: No results for input(s): PROCALCITON, LATICACIDVEN in the last 168 hours.  No results found for this or any previous visit (from the past 240 hours).       Radiology Studies: No results found.      Scheduled Meds:  atorvastatin   20 mg Oral Daily   divalproex   625 mg Oral Q8H   DULoxetine   60 mg Oral BID   enoxaparin  (LOVENOX ) injection  40 mg Subcutaneous Q24H   hydrOXYzine   50 mg Oral QHS   metFORMIN   500 mg Oral BID WC   multivitamin with minerals  1 tablet Oral  Daily   propranolol   10 mg Oral BID   valbenazine   80 mg Oral Daily   Continuous Infusions:        Sophie Mao, MD [facility] 04/24/2024, 8:13 AM

## 2024-04-24 NOTE — Telephone Encounter (Signed)
 [facility]  Faxed in Therapy Evaluation Request  Procedure Information:  Disciplines: Occupational Therapy( OT)  Physical Therapy ( PT)  Urgent Referral : No Home Health & Hospice Contact Number :   Additional Notes/ Reason for Referral : Weakness  Reviewed and Signed by Provider : Jeoffrey Barrio  Faxed to [facility] at [phone]  Email. Admit @ [email or web address]

## 2024-04-24 NOTE — Plan of Care (Signed)

## 2024-04-24 NOTE — Progress Notes (Signed)
 Physical Therapy Treatment Patient Details Name: [name] [name] MRN: [id number] DOB: 1965/06/12 Today's Date: 04/24/2024   History of Present Illness Pt is a 58 y/o male presenting on 10/24 for fall and chronic back pain. 10/19 psychiatry consulted due to suicidal thoughts, cleared on 10/22. Change in mental status due to acute delirium, and admitted from ED with acute metabolic encephalopathy. CT negative, EEG negative, MRI pending, chest x ray with hypoventilation. PMH includes: bipolar disorder type 1, tardive dyskinesia, PTSD, MDD, CAD, DM2, OA, tremors.    PT Comments   Pt admitted with above diagnosis.  Pt currently with functional limitations due to the deficits listed below (see PT Problem List). Pt in bed when PT arrived. Pt agreeable to therapy intervention.  Pt required CGA and min cues with HOB slightly elevated for supine to sit, increased time to scoot to EOB, min A for initial standing balance due to posterior lean and with cues pt able to modify posture and progress to CGA with B UE support at RW, pt able to participate with higher level balance challenges, pt elected to return to bed s/p PT intervention. Pt left in bed all needs in place.  Patient will benefit from continued inpatient follow up therapy, <3 hours/day.  Pt will benefit from acute skilled PT to increase their independence and safety with mobility to allow discharge.      If plan is discharge home, recommend the following: Assistance with cooking/housework;Assist for transportation;Help with stairs or ramp for entrance;Direct supervision/assist for medications management;A little help with walking and/or transfers;A little help with bathing/dressing/bathroom;Direct supervision/assist for financial management   Can travel by private vehicle        Equipment Recommendations  None recommended by PT    Recommendations for Other Services       Precautions / Restrictions Precautions Precautions: Fall Recall of  Precautions/Restrictions: Impaired Precaution/Restrictions Comments: Parkinson's Restrictions Weight Bearing Restrictions Per Provider Order: No Other Position/Activity Restrictions: chronic back pain, history of Parkinson's disease     Mobility  Bed Mobility Overal bed mobility: Needs Assistance Bed Mobility: Supine to Sit, Sit to Supine     Supine to sit: Contact guard, HOB elevated, Used rails Sit to supine: Supervision   General bed mobility comments: min cues, flat bed for sit to supine and increased time    Transfers Overall transfer level: Needs assistance Equipment used: Rolling walker (2 wheels) Transfers: Sit to/from Stand Sit to Stand: Min assist, Contact guard assist           General transfer comment: min A due to initial posterior lean with cues for anterior weight shift and to limit use of posterior B LE on bed for support    Ambulation/Gait Ambulation/Gait assistance: Contact guard assist Gait Distance (Feet): 160 Feet Assistive device: Rolling walker (2 wheels) Gait Pattern/deviations: Decreased stride length, Narrow base of support, Step-to pattern, Decreased step length - right, Decreased step length - left Gait velocity: decreased     General Gait Details: pt demonstrated improved attention and hazard recognition with obstacle navigation in personal room and in hallway, B UE support at RW, no overt LOB, B LE decreased stride length however B feet clearing with swing phase, min cues for safety, posture and RW management   Stairs             Wheelchair Mobility     Tilt Bed    Modified Rankin (Stroke Patients Only)       Balance Overall balance assessment: Needs assistance Sitting-balance  support: Feet supported Sitting balance-Leahy Scale: Good     Standing balance support: Bilateral upper extremity supported, During functional activity, Reliant on assistive device for balance Standing balance-Leahy Scale: Fair Standing balance  comment: CGA to min A with B UE support at RW, pt able to maintain static standing no UE support and guided through self perturbation with UE movements outisde BOS and crossing midline, pt able to progress to accpting minimal external perterbation and no overt LOB                            Communication Communication Communication: No apparent difficulties  Cognition Arousal: Alert Behavior During Therapy: Flat affect, WFL for tasks assessed/performed   PT - Cognitive impairments: Orientation                         Following commands: Impaired Following commands impaired: Follows one step commands with increased time    Cueing Cueing Techniques: Verbal cues, Gestural cues, Tactile cues, Visual cues  Exercises      General Comments        Pertinent Vitals/Pain      Home Living                          Prior Function            PT Goals (current goals can now be found in the care plan section) Acute Rehab PT Goals Patient Stated Goal: Decreased back pain; no more falls Progress towards PT goals: Progressing toward goals    Frequency    Min 2X/week      PT Plan      Co-evaluation              AM-PAC PT 6 Clicks Mobility   Outcome Measure  Help needed turning from your back to your side while in a flat bed without using bedrails?: A Little Help needed moving from lying on your back to sitting on the side of a flat bed without using bedrails?: A Little Help needed moving to and from a bed to a chair (including a wheelchair)?: A Little Help needed standing up from a chair using your arms (e.g., wheelchair or bedside chair)?: A Little Help needed to walk in hospital room?: A Little Help needed climbing 3-5 steps with a railing? : A Lot 6 Click Score: 17    End of Session Equipment Utilized During Treatment: Gait belt Activity Tolerance: Patient tolerated treatment well Patient left: Other (comment);in bed;with bed alarm  set;with call bell/phone within reach (tele sitter) Nurse Communication: Mobility status PT Visit Diagnosis: History of falling (Z91.81);Unsteadiness on feet (R26.81);Other abnormalities of gait and mobility (R26.89);Muscle weakness (generalized) (M62.81);Repeated falls (R29.6)     Time: 8561-8541 PT Time Calculation (min) (ACUTE ONLY): 20 min  Charges:    $Gait Training: 8-22 mins PT General Charges $$ ACUTE PT VISIT: 1 Visit                     [place], PT Acute Rehab    [place] VEAR Drone 04/24/2024, 7:02 PM

## 2024-04-24 NOTE — Plan of Care (Signed)

## 2024-04-24 NOTE — Progress Notes (Signed)
 ICM Supervisor spoke to APS worker Shefi- she informed ICM that the APS SW is working on placement. Shefi reported 17 facilities have been contacted. She expressed concern with the holidays and most facilities will be closed. Shefi and ICM Supervisor will touch base next Tues to discuss any traction for d/c

## 2024-04-24 NOTE — Plan of Care (Signed)
°  Problem: Health Behavior/Discharge Planning: Goal: Ability to manage health-related needs will improve 04/24/2024 0533 by Melvyn Melvyn NOVAK, RN Outcome: Progressing 04/24/2024 0532 by Melvyn Melvyn NOVAK, RN Outcome: Progressing   Problem: Clinical Measurements: Goal: Ability to maintain clinical measurements within normal limits will improve 04/24/2024 0533 by Melvyn Melvyn NOVAK, RN Outcome: Progressing 04/24/2024 0532 by Melvyn Melvyn NOVAK, RN Outcome: Progressing Goal: Will remain free from infection 04/24/2024 0533 by Melvyn Melvyn NOVAK, RN Outcome: Progressing 04/24/2024 0532 by Melvyn Melvyn NOVAK, RN Outcome: Progressing Goal: Diagnostic test results will improve 04/24/2024 0533 by Melvyn Melvyn NOVAK, RN Outcome: Progressing 04/24/2024 0532 by Melvyn Melvyn NOVAK, RN Outcome: Progressing Goal: Respiratory complications will improve 04/24/2024 0533 by Melvyn Melvyn NOVAK, RN Outcome: Progressing 04/24/2024 0532 by Melvyn Melvyn NOVAK, RN Outcome: Progressing Goal: Cardiovascular complication will be avoided Outcome: Progressing   Problem: Activity: Goal: Risk for activity intolerance will decrease Outcome: Progressing

## 2024-04-25 DIAGNOSIS — G9340 Encephalopathy, unspecified: Secondary | ICD-10-CM | POA: Diagnosis not present

## 2024-04-25 NOTE — Progress Notes (Signed)
 OT Cancellation Note  Patient Details Name: QUANTE PETTRY MRN: [id number] DOB: [birth year]   Cancelled Treatment:    Reason Eval/Treat Not Completed: Other (comment) (pt declining to walk or move OOB. Pt agreeable to work with therapy tomorrow.)  NEOMI MAIZE 04/25/2024, 5:22 PM Kreg Neomi, OT/L   Acute OT Clinical Specialist Acute Rehabilitation Services Pager [phone] Office [phone]

## 2024-04-25 NOTE — Progress Notes (Signed)
 PROGRESS NOTE    [name] [name]  FMW:[id number] DOB: 05-Jul-1965 DOA: 03/02/2024 PCP: [name], [name]   Brief Narrative:  58 year old M with PMH of type I bipolar disorder, tardive dyskinesia, PTSD, MDD, GAD, DM-2, tremor, frequent falls and hyperlipidemia was brought to ED by EMS on 10/18 due to talking crazy and altered mental status.    Per EDP note, roommate/friend reported problems with the patient having frequent falls in the house, difficulty caring for himself, erratic behavior including today where he had a very loud outburst in front of her, calling her names, and then was overheard stating he may kill himself.  She is concerned because he has access to 2 guns in the house.     Patient boarded in the emergency room since 10/18. 10/19, psychiatry consulted as patient was endorsing suicidal thoughts-IVC 10/22, psychiatry cleared for discharge. 10/24, patient was noted to be encephalopathic so admitted to the hospital.  11/4, MRI under sedation, transient hypotension and altered mental status-overnight in ICU. 11/5, clinically improved.  Sedation wore off.  Transferred back to MedSurg bed.     Has no capacity. Two-physician DNR on 03/27/2024 after discussion with palliative medicine given patient's multiple medical comorbidity. APS involved.  Waiting on placement.  Assessment & Plan:   Acute metabolic encephalopathy in patient with underlying psychiatric disorder -Felt to be multifactorial including positive underlying cognitive impairment, dehydration, delirium and iatrogenic. CTA and MRI brain negative for acute findings. EEG X3 negative. TSH, B12 within normal limits. Delirium/fall precautions   Bipolar disorder/MDD/GAD/PTSD - Psychiatry has seen the patient during this hospitalization and has signed off.  Continue Depakote , duloxetine , hydroxyzine , propranolol  and valbenazine .  Outpatient follow-up with psychiatry.  Mental status currently stable.  Continue Haldol  as  needed  Hypotension secondary to sedation--resolved  Hyperlipidemia Hypertension -Continue statin and propranolol   Diabetes mellitus type 2 - A1c 6.3.  Continue metformin   Hypoxemia - Resolved  Medical decision making -Does not necessarily demonstrate any capacity to make his own medical decision. APS is involved   Goals of care - Previously to physicians signed DNR.  Palliative care also was involved in the care and followed the patient during this hospitalization   DVT prophylaxis: Lovenox  Code Status: DNR Family Communication: None at bedside Disposition Plan: Status is: Inpatient Remains inpatient appropriate because: Of severity of illness.  Awaiting on APS guardianship and SNF placement.    Consultants: Neurology/PCCM/psychiatry/palliative care  Procedures: As discussed above  Antimicrobials: None   Subjective: Patient seen and examined at bedside.  Poor historian.  No fever no vomiting or agitation reported  objective: Vitals:   04/23/24 2123 04/24/24 0506 04/24/24 1246 04/24/24 1951  BP: 109/69 (!) 151/115 99/70 100/76  Pulse: 65 (!) 49 (!) 55 70  Resp:   15   Temp:   97.9 F (36.6 C) 97.6 F (36.4 C)  TempSrc:   Oral Oral  SpO2:  100% 95% 94%  Weight:      Height:        Intake/Output Summary (Last 24 hours) at 04/25/2024 0817 Last data filed at 04/24/2024 1230 Gross per 24 hour  Intake 240 ml  Output --  Net 240 ml   Filed Weights   03/07/24 2229 03/26/24 1715  Weight: 87.4 kg 83 kg    Examination:  General: On room air currently with no distress.  Extremely slow to respond. Very poor historian. respiratory: decreased breath sounds at bases bilaterally with some crackles with CVS: Intermittently bradycardic; S1 and S2. abdominal: Soft,  nontender, distended mildly; no organomegaly; bowel sounds heard normally extremities: Trace lower extremity edema present; no clubbing  Data Reviewed: I have personally reviewed following labs and  imaging studies  CBC: No results for input(s): WBC, NEUTROABS, HGB, HCT, MCV, PLT in the last 168 hours. Basic Metabolic Panel: No results for input(s): NA, K, CL, CO2, GLUCOSE, BUN, CREATININE, CALCIUM , MG, PHOS in the last 168 hours. GFR: CrCl cannot be calculated (Patient's most recent lab result is older than the maximum 21 days allowed.). Liver Function Tests: No results for input(s): AST, ALT, ALKPHOS, BILITOT, PROT, ALBUMIN  in the last 168 hours. No results for input(s): LIPASE, AMYLASE in the last 168 hours. No results for input(s): AMMONIA in the last 168 hours. Coagulation Profile: No results for input(s): INR, PROTIME in the last 168 hours. Cardiac Enzymes: No results for input(s): CKTOTAL, CKMB, CKMBINDEX, TROPONINI in the last 168 hours. BNP (last 3 results) No results for input(s): PROBNP in the last 8760 hours. HbA1C: No results for input(s): HGBA1C in the last 72 hours. CBG: No results for input(s): GLUCAP in the last 168 hours. Lipid Profile: No results for input(s): CHOL, HDL, LDLCALC, TRIG, CHOLHDL, LDLDIRECT in the last 72 hours. Thyroid  Function Tests: No results for input(s): TSH, T4TOTAL, FREET4, T3FREE, THYROIDAB in the last 72 hours. Anemia Panel: No results for input(s): VITAMINB12, FOLATE, FERRITIN, TIBC, IRON, RETICCTPCT in the last 72 hours. Sepsis Labs: No results for input(s): PROCALCITON, LATICACIDVEN in the last 168 hours.  No results found for this or any previous visit (from the past 240 hours).       Radiology Studies: No results found.      Scheduled Meds:  atorvastatin   20 mg Oral Daily   divalproex   625 mg Oral Q8H   DULoxetine   60 mg Oral BID   enoxaparin  (LOVENOX ) injection  40 mg Subcutaneous Q24H   hydrOXYzine   50 mg Oral QHS   metFORMIN   500 mg Oral BID WC   multivitamin with minerals  1 tablet Oral Daily    propranolol   10 mg Oral BID   valbenazine   80 mg Oral Daily   Continuous Infusions:        Sophie Mao, MD [facility] 04/25/2024, 8:17 AM

## 2024-04-25 NOTE — TOC Progression Note (Signed)
 Transition of Care [facility]) - Progression Note    Patient Details  Name: [name] [name] MRN: [id number] Date of Birth: [birth year]  [facility] CM/SW Contact  Doneta Glenys DASEN, RN Phone Number: 04/25/2024, 4:17 PM  Clinical Narrative:    Still no bed offers. CM called APS Terris Gosling [phone], left message.     Barriers to Discharge: SNF Pending bed offer               Expected Discharge Plan and Services                                               Social Drivers of Health (SDOH) Interventions SDOH Screenings   Food Insecurity: Patient Unable To Answer (03/07/2024)  Housing: Unknown (03/07/2024)  Transportation Needs: Patient Unable To Answer (03/07/2024)  Utilities: Patient Unable To Answer (03/07/2024)  Depression (PHQ2-9): High Risk (01/26/2024)  Social Connections: Unknown (09/22/2021)   Received from [facility]  Tobacco Use: Low Risk (03/13/2024)    Readmission Risk Interventions     No data to display

## 2024-04-25 NOTE — Plan of Care (Signed)

## 2024-04-26 ENCOUNTER — Encounter (HOSPITAL_COMMUNITY): Admitting: Psychiatry

## 2024-04-26 DIAGNOSIS — G9340 Encephalopathy, unspecified: Secondary | ICD-10-CM | POA: Diagnosis not present

## 2024-04-26 NOTE — Progress Notes (Signed)
 PROGRESS NOTE    [name] [name]  FMW:[id number] DOB: 05/31/1965 DOA: 03/02/2024 PCP: [name], [name]   Brief Narrative:  58 year old M with PMH of type I bipolar disorder, tardive dyskinesia, PTSD, MDD, GAD, DM-2, tremor, frequent falls and hyperlipidemia was brought to ED by EMS on 10/18 due to talking crazy and altered mental status.    Per EDP note, roommate/friend reported problems with the patient having frequent falls in the house, difficulty caring for himself, erratic behavior including today where he had a very loud outburst in front of her, calling her names, and then was overheard stating he may kill himself.  She is concerned because he has access to 2 guns in the house.     Patient boarded in the emergency room since 10/18. 10/19, psychiatry consulted as patient was endorsing suicidal thoughts-IVC 10/22, psychiatry cleared for discharge. 10/24, patient was noted to be encephalopathic so admitted to the hospital.  11/4, MRI under sedation, transient hypotension and altered mental status-overnight in ICU. 11/5, clinically improved.  Sedation wore off.  Transferred back to MedSurg bed.     Has no capacity. Two-physician DNR on 03/27/2024 after discussion with palliative medicine given patient's multiple medical comorbidity. APS involved.  Waiting on placement.  Assessment & Plan:   Acute metabolic encephalopathy in patient with underlying psychiatric disorder -Felt to be multifactorial including positive underlying cognitive impairment, dehydration, delirium and iatrogenic. CTA and MRI brain negative for acute findings. EEG X3 negative. TSH, B12 within normal limits. Delirium/fall precautions   Bipolar disorder/MDD/GAD/PTSD - Psychiatry has seen the patient during this hospitalization and has signed off.  Continue Depakote , duloxetine , hydroxyzine , propranolol  and valbenazine .  Outpatient follow-up with psychiatry.  Mental status currently stable.  Continue Haldol  as  needed  Hypotension secondary to sedation--resolved  Hyperlipidemia Hypertension -Continue statin and propranolol   Diabetes mellitus type 2 - A1c 6.3.  Continue metformin   Hypoxemia - Resolved  Medical decision making -Does not necessarily demonstrate any capacity to make his own medical decision. APS is involved   Goals of care - Previously to physicians signed DNR.  Palliative care also was involved in the care and followed the patient during this hospitalization   DVT prophylaxis: Lovenox  Code Status: DNR Family Communication: None at bedside Disposition Plan: Status is: Inpatient Remains inpatient appropriate because: Of severity of illness.  Awaiting on APS guardianship and SNF placement.    Consultants: Neurology/PCCM/psychiatry/palliative care  Procedures: As discussed above  Antimicrobials: None   Subjective: Patient seen and examined at bedside.  Poor historian.  No agitation, seizures or vomiting reported.   Objective: Vitals:   04/25/24 1320 04/25/24 1922 04/25/24 2208 04/26/24 0522  BP: (!) 138/111 102/70 102/70 95/65  Pulse: 77 66 66 (!) 52  Resp: 15 16  15   Temp: 97.6 F (36.4 C) 97.6 F (36.4 C)  (!) 97.4 F (36.3 C)  TempSrc: Oral     SpO2: 96% 95%  95%  Weight:      Height:        Intake/Output Summary (Last 24 hours) at 04/26/2024 0821 Last data filed at 04/25/2024 1230 Gross per 24 hour  Intake 360 ml  Output --  Net 360 ml   Filed Weights   03/07/24 2229 03/26/24 1715  Weight: 87.4 kg 83 kg    Examination:  General: No acute distress.  Remains on room air.  Extremely slow to respond. Very poor historian. respiratory: Bilateral decreased breath sounds at bases with scattered crackles CVS: S1-S2 heard; has intermittent  bradycardia abdominal: Soft, nontender, distended slightly, no organomegaly; normal bowel sounds heard  extremities: No cyanosis; mild lower extremity edema present  Data Reviewed: I have personally reviewed  following labs and imaging studies  CBC: No results for input(s): WBC, NEUTROABS, HGB, HCT, MCV, PLT in the last 168 hours. Basic Metabolic Panel: No results for input(s): NA, K, CL, CO2, GLUCOSE, BUN, CREATININE, CALCIUM , MG, PHOS in the last 168 hours. GFR: CrCl cannot be calculated (Patient's most recent lab result is older than the maximum 21 days allowed.). Liver Function Tests: No results for input(s): AST, ALT, ALKPHOS, BILITOT, PROT, ALBUMIN  in the last 168 hours. No results for input(s): LIPASE, AMYLASE in the last 168 hours. No results for input(s): AMMONIA in the last 168 hours. Coagulation Profile: No results for input(s): INR, PROTIME in the last 168 hours. Cardiac Enzymes: No results for input(s): CKTOTAL, CKMB, CKMBINDEX, TROPONINI in the last 168 hours. BNP (last 3 results) No results for input(s): PROBNP in the last 8760 hours. HbA1C: No results for input(s): HGBA1C in the last 72 hours. CBG: No results for input(s): GLUCAP in the last 168 hours. Lipid Profile: No results for input(s): CHOL, HDL, LDLCALC, TRIG, CHOLHDL, LDLDIRECT in the last 72 hours. Thyroid  Function Tests: No results for input(s): TSH, T4TOTAL, FREET4, T3FREE, THYROIDAB in the last 72 hours. Anemia Panel: No results for input(s): VITAMINB12, FOLATE, FERRITIN, TIBC, IRON, RETICCTPCT in the last 72 hours. Sepsis Labs: No results for input(s): PROCALCITON, LATICACIDVEN in the last 168 hours.  No results found for this or any previous visit (from the past 240 hours).       Radiology Studies: No results found.      Scheduled Meds:  atorvastatin   20 mg Oral Daily   divalproex   625 mg Oral Q8H   DULoxetine   60 mg Oral BID   enoxaparin  (LOVENOX ) injection  40 mg Subcutaneous Q24H   hydrOXYzine   50 mg Oral QHS   metFORMIN   500 mg Oral BID WC   multivitamin with minerals  1 tablet Oral  Daily   propranolol   10 mg Oral BID   valbenazine   80 mg Oral Daily   Continuous Infusions:        Sophie Mao, MD [facility] 04/26/2024, 8:21 AM

## 2024-04-26 NOTE — Progress Notes (Signed)
 Occupational Therapy Treatment Patient Details Name: [name] [name] MRN: [id number] DOB: May 26, 1965 Today's Date: 04/26/2024   History of present illness Pt is a 58 yr old male who presented to the hospital after a fall and chronic back pain. Pt admitted with acute delirium, and admitted from ED with acute metabolic encephalopathy.  PMH includes: bipolar disorder type 1, tardive dyskinesia, PTSD, MDD, CAD, DM2, OA, tremors.   OT comments  The pt was received seated in the chair. He was motivated to participate in the therapy session. He required CGA to min assist for sit to stand and functional ambulation using a RW, min assist for upper body dressing, and set-up assist for applying lotion to his BUE seated EOB. He was noted to be with BUE shakiness/tremulous presentation and unsteadiness in dynamic standing. He is making gradual functional progress. Continue OT plan care. Patient will benefit from continued inpatient follow up therapy, <3 hours/day       If plan is discharge home, recommend the following:  A little help with walking and/or transfers;Assistance with cooking/housework;Direct supervision/assist for medications management;Direct supervision/assist for financial management;Assist for transportation;Help with stairs or ramp for entrance;A little help with bathing/dressing/bathroom   Equipment Recommendations  Other (comment) (defer to next level of care)    Recommendations for Other Services      Precautions / Restrictions Precautions Precautions: Fall Restrictions Weight Bearing Restrictions Per Provider Order: No Other Position/Activity Restrictions: chronic back pain, history of Parkinson's disease       Mobility Bed Mobility Overal bed mobility: Needs Assistance Bed Mobility: Sit to Supine       Sit to supine: Supervision        Transfers Overall transfer level: Needs assistance Equipment used: Rolling walker (2 wheels) Transfers: Sit to/from Stand Sit  to Stand: Contact guard assist           General transfer comment: from bedside chair     Balance     Sitting balance-Leahy Scale: Good         Standing balance comment: CGA with RW         ADL either performed or assessed with clinical judgement   ADL Overall ADL's : Needs assistance/impaired     Grooming: Set up;Sitting;Supervision/safety Grooming Details (indicate cue type and reason): The pt applied lotion to his BUE seated EOB. He required assist to squeeze the lotion into his hands, as he was limited by BUE shakiness/tremulous like presentation.         Upper Body Dressing : Minimal assistance;Sitting Upper Body Dressing Details (indicate cue type and reason): The pt required assist to donn a hospital gown around his back seated in the bedside chair.                     Communication Communication Communication: No apparent difficulties   Cognition Arousal: Alert Behavior During Therapy: Flat affect, WFL for tasks assessed/performed     Orientation impairments: Situation   Memory impairment (select all impairments): Short-term memory, Working memory Attention impairment (select first level of impairment): Divided attention Executive functioning impairment (select all impairments): Reasoning      Following commands: Impaired Following commands impaired: Follows one step commands with increased time      Cueing   Cueing Techniques: Verbal cues, Gestural cues, Tactile cues  Exercises              Pertinent Vitals/ Pain       Pain Assessment Pain Assessment: No/denies pain  Frequency  Min 2X/week        Progress Toward Goals  OT Goals(current goals can now be found in the care plan section)  Progress towards OT goals: Progressing toward goals  Acute Rehab OT Goals Patient Stated Goal: to return home OT Goal Formulation: With patient Time For Goal Achievement: 05/06/24 Potential to Achieve Goals: Good  Plan         AM-PAC  OT 6 Clicks Daily Activity     Outcome Measure   Help from another person eating meals?: None Help from another person taking care of personal grooming?: A Little Help from another person toileting, which includes using toliet, bedpan, or urinal?: A Little Help from another person bathing (including washing, rinsing, drying)?: A Little Help from another person to put on and taking off regular upper body clothing?: A Little Help from another person to put on and taking off regular lower body clothing?: A Lot 6 Click Score: 18    End of Session Equipment Utilized During Treatment: Gait belt;Rolling walker (2 wheels)  OT Visit Diagnosis: Other abnormalities of gait and mobility (R26.89);Unsteadiness on feet (R26.81);Muscle weakness (generalized) (M62.81);History of falling (Z91.81);Other symptoms and signs involving cognitive function   Activity Tolerance Patient tolerated treatment well   Patient Left in bed;with call bell/phone within reach;with bed alarm set   Nurse Communication Mobility status        Time: 8451-8394 OT Time Calculation (min): 17 min  Charges: OT General Charges $OT Visit: 1 Visit OT Treatments $Self Care/Home Management : 8-22 mins    [name], OTR/L 04/26/2024, 5:43 PM

## 2024-04-26 NOTE — Plan of Care (Signed)

## 2024-04-26 NOTE — Plan of Care (Signed)

## 2024-04-27 DIAGNOSIS — G9340 Encephalopathy, unspecified: Secondary | ICD-10-CM | POA: Diagnosis not present

## 2024-04-27 LAB — MRSA NEXT GEN BY PCR, NASAL: MRSA by PCR Next Gen: NOT DETECTED

## 2024-04-27 NOTE — Progress Notes (Signed)
 " PROGRESS NOTE    [name] [name]  FMW:[id number] DOB: 08-05-1965 DOA: 03/02/2024 PCP: [name], [name]   Brief Narrative:  58 year old M with PMH of type I bipolar disorder, tardive dyskinesia, PTSD, MDD, GAD, DM-2, tremor, frequent falls and hyperlipidemia was brought to ED by EMS on 10/18 due to talking crazy and altered mental status.    Per EDP note, roommate/friend reported problems with the patient having frequent falls in the house, difficulty caring for himself, erratic behavior including today where he had a very loud outburst in front of her, calling her names, and then was overheard stating he may kill himself.  She is concerned because he has access to 2 guns in the house.     Patient boarded in the emergency room since 10/18. 10/19, psychiatry consulted as patient was endorsing suicidal thoughts-IVC 10/22, psychiatry cleared for discharge. 10/24, patient was noted to be encephalopathic so admitted to the hospital.  11/4, MRI under sedation, transient hypotension and altered mental status-overnight in ICU. 11/5, clinically improved.  Sedation wore off.  Transferred back to MedSurg bed.     Has no capacity. Two-physician DNR on 03/27/2024 after discussion with palliative medicine given patient's multiple medical comorbidity. APS involved.  Waiting on placement.  Assessment & Plan:   Acute metabolic encephalopathy in patient with underlying psychiatric disorder -Felt to be multifactorial including positive underlying cognitive impairment, dehydration, delirium and iatrogenic. CTA and MRI brain negative for acute findings. EEG X3 negative. TSH, B12 within normal limits. Delirium/fall precautions   Bipolar disorder/MDD/GAD/PTSD - Psychiatry has seen the patient during this hospitalization and has signed off.  Continue Depakote , duloxetine , hydroxyzine , propranolol  and valbenazine .  Outpatient follow-up with psychiatry.  Mental status currently stable.  Continue Haldol  as  needed  Hypotension secondary to sedation--resolved  Hyperlipidemia Hypertension -Continue statin and propranolol   Diabetes mellitus type 2 - A1c 6.3.  Continue metformin   Hypoxemia - Resolved  Medical decision making -Does not necessarily demonstrate any capacity to make his own medical decision. APS is involved   Goals of care - Previously to physicians signed DNR.  Palliative care also was involved in the care and followed the patient during this hospitalization   DVT prophylaxis: Lovenox  Code Status: DNR Family Communication: None at bedside Disposition Plan: Status is: Inpatient Remains inpatient appropriate because: Of severity of illness.  Awaiting on APS guardianship and SNF placement.    Consultants: Neurology/PCCM/psychiatry/palliative care  Procedures: As discussed above  Antimicrobials: None   Subjective: Patient seen and examined at bedside.  Poor historian.  No fever, agitation reported  objective: Vitals:   04/26/24 0522 04/26/24 1210 04/26/24 1910 04/27/24 0507  BP: 95/65 113/75 100/70 108/75  Pulse: (!) 52 63 70 62  Resp: 15 18 16 17   Temp: (!) 97.4 F (36.3 C) 98.2 F (36.8 C) 98.2 F (36.8 C) 97.7 F (36.5 C)  TempSrc:      SpO2: 95% 97% 96% 96%  Weight:      Height:        Intake/Output Summary (Last 24 hours) at 04/27/2024 0816 Last data filed at 04/26/2024 1700 Gross per 24 hour  Intake 720 ml  Output --  Net 720 ml   Filed Weights   03/07/24 2229 03/26/24 1715  Weight: 87.4 kg 83 kg    Examination:  General: Currently in no distress and on room air.  Extremely slow to respond. Very poor historian. respiratory: Decreased breath sounds at bases bilaterally with no wheeze  CVS: S1-S2 heard; rate  currently controlled  abdominal: Soft, nontender, remains distended slightly, no organomegaly; bowel sounds are normally heard  extremities: Trace lower extremity edema; no clubbing  Data Reviewed: I have personally reviewed  following labs and imaging studies  CBC: No results for input(s): WBC, NEUTROABS, HGB, HCT, MCV, PLT in the last 168 hours. Basic Metabolic Panel: No results for input(s): NA, K, CL, CO2, GLUCOSE, BUN, CREATININE, CALCIUM , MG, PHOS in the last 168 hours. GFR: CrCl cannot be calculated (Patient's most recent lab result is older than the maximum 21 days allowed.). Liver Function Tests: No results for input(s): AST, ALT, ALKPHOS, BILITOT, PROT, ALBUMIN  in the last 168 hours. No results for input(s): LIPASE, AMYLASE in the last 168 hours. No results for input(s): AMMONIA in the last 168 hours. Coagulation Profile: No results for input(s): INR, PROTIME in the last 168 hours. Cardiac Enzymes: No results for input(s): CKTOTAL, CKMB, CKMBINDEX, TROPONINI in the last 168 hours. BNP (last 3 results) No results for input(s): PROBNP in the last 8760 hours. HbA1C: No results for input(s): HGBA1C in the last 72 hours. CBG: No results for input(s): GLUCAP in the last 168 hours. Lipid Profile: No results for input(s): CHOL, HDL, LDLCALC, TRIG, CHOLHDL, LDLDIRECT in the last 72 hours. Thyroid  Function Tests: No results for input(s): TSH, T4TOTAL, FREET4, T3FREE, THYROIDAB in the last 72 hours. Anemia Panel: No results for input(s): VITAMINB12, FOLATE, FERRITIN, TIBC, IRON, RETICCTPCT in the last 72 hours. Sepsis Labs: No results for input(s): PROCALCITON, LATICACIDVEN in the last 168 hours.  Recent Results (from the past 240 hours)  MRSA Next Gen by PCR, Nasal     Status: None   Collection Time: 04/26/24  9:26 PM   Specimen: Nasal Mucosa; Nasal Swab  Result Value Ref Range Status   MRSA by PCR Next Gen NOT DETECTED NOT DETECTED Final    Comment: (NOTE) The GeneXpert MRSA Assay (FDA approved for NASAL specimens only), is one component of a comprehensive MRSA colonization  surveillance program. It is not intended to diagnose MRSA infection nor to guide or monitor treatment for MRSA infections. Test performance is not FDA approved in patients less than 62 years old. Performed at [facility], [address], [place], [place] [address]          Radiology Studies: No results found.      Scheduled Meds:  atorvastatin   20 mg Oral Daily   divalproex   625 mg Oral Q8H   DULoxetine   60 mg Oral BID   enoxaparin  (LOVENOX ) injection  40 mg Subcutaneous Q24H   hydrOXYzine   50 mg Oral QHS   metFORMIN   500 mg Oral BID WC   multivitamin with minerals  1 tablet Oral Daily   propranolol   10 mg Oral BID   valbenazine   80 mg Oral Daily   Continuous Infusions:        Sophie Mao, MD [facility] 04/27/2024, 8:16 AM    "

## 2024-04-27 NOTE — Plan of Care (Signed)
 " Problem: Health Behavior/Discharge Planning: Goal: Ability to manage health-related needs will improve Outcome: Progressing   Problem: Clinical Measurements: Goal: Ability to maintain clinical measurements within normal limits will improve Outcome: Progressing Goal: Will remain free from infection Outcome: Progressing Goal: Diagnostic test results will improve Outcome: Progressing Goal: Respiratory complications will improve Outcome: Progressing Goal: Cardiovascular complication will be avoided Outcome: Progressing   Problem: Activity: Goal: Risk for activity intolerance will decrease Outcome: Progressing   Problem: Nutrition: Goal: Adequate nutrition will be maintained Outcome: Progressing   Problem: Coping: Goal: Level of anxiety will decrease Outcome: Progressing   Problem: Elimination: Goal: Will not experience complications related to bowel motility Outcome: Progressing Goal: Will not experience complications related to urinary retention Outcome: Progressing   Problem: Pain Managment: Goal: General experience of comfort will improve and/or be controlled Outcome: Progressing   Problem: Safety: Goal: Ability to remain free from injury will improve Outcome: Progressing   Problem: Skin Integrity: Goal: Risk for impaired skin integrity will decrease Outcome: Progressing   Problem: Education: Goal: Ability to describe self-care measures that may prevent or decrease complications (Diabetes Survival Skills Education) will improve Outcome: Progressing Goal: Individualized Educational Video(s) Outcome: Progressing   Problem: Coping: Goal: Ability to adjust to condition or change in health will improve Outcome: Progressing   Problem: Fluid Volume: Goal: Ability to maintain a balanced intake and output will improve Outcome: Progressing   Problem: Health Behavior/Discharge Planning: Goal: Ability to identify and utilize available resources and services will  improve Outcome: Progressing Goal: Ability to manage health-related needs will improve Outcome: Progressing   Problem: Metabolic: Goal: Ability to maintain appropriate glucose levels will improve Outcome: Progressing   Problem: Nutritional: Goal: Maintenance of adequate nutrition will improve Outcome: Progressing Goal: Progress toward achieving an optimal weight will improve Outcome: Progressing   Problem: Skin Integrity: Goal: Risk for impaired skin integrity will decrease Outcome: Progressing   Problem: Tissue Perfusion: Goal: Adequacy of tissue perfusion will improve Outcome: Progressing   Problem: Health Behavior/Discharge Planning: Goal: Ability to manage health-related needs will improve Outcome: Progressing   Problem: Clinical Measurements: Goal: Ability to maintain clinical measurements within normal limits will improve Outcome: Progressing Goal: Will remain free from infection Outcome: Progressing Goal: Diagnostic test results will improve Outcome: Progressing Goal: Respiratory complications will improve Outcome: Progressing Goal: Cardiovascular complication will be avoided Outcome: Progressing   Problem: Activity: Goal: Risk for activity intolerance will decrease Outcome: Progressing   Problem: Nutrition: Goal: Adequate nutrition will be maintained Outcome: Progressing   Problem: Coping: Goal: Level of anxiety will decrease Outcome: Progressing   Problem: Elimination: Goal: Will not experience complications related to bowel motility Outcome: Progressing Goal: Will not experience complications related to urinary retention Outcome: Progressing   Problem: Pain Managment: Goal: General experience of comfort will improve and/or be controlled Outcome: Progressing   Problem: Safety: Goal: Ability to remain free from injury will improve Outcome: Progressing   Problem: Skin Integrity: Goal: Risk for impaired skin integrity will decrease Outcome:  Progressing   Problem: Education: Goal: Ability to describe self-care measures that may prevent or decrease complications (Diabetes Survival Skills Education) will improve Outcome: Progressing Goal: Individualized Educational Video(s) Outcome: Progressing   Problem: Coping: Goal: Ability to adjust to condition or change in health will improve Outcome: Progressing   Problem: Fluid Volume: Goal: Ability to maintain a balanced intake and output will improve Outcome: Progressing   Problem: Health Behavior/Discharge Planning: Goal: Ability to identify and utilize available resources and services will improve Outcome:  Progressing Goal: Ability to manage health-related needs will improve Outcome: Progressing   Problem: Metabolic: Goal: Ability to maintain appropriate glucose levels will improve Outcome: Progressing   Problem: Nutritional: Goal: Maintenance of adequate nutrition will improve Outcome: Progressing Goal: Progress toward achieving an optimal weight will improve Outcome: Progressing   Problem: Skin Integrity: Goal: Risk for impaired skin integrity will decrease Outcome: Progressing   Problem: Tissue Perfusion: Goal: Adequacy of tissue perfusion will improve Outcome: Progressing   Problem: Health Behavior/Discharge Planning: Goal: Ability to manage health-related needs will improve Outcome: Progressing   Problem: Clinical Measurements: Goal: Ability to maintain clinical measurements within normal limits will improve Outcome: Progressing Goal: Will remain free from infection Outcome: Progressing Goal: Diagnostic test results will improve Outcome: Progressing Goal: Respiratory complications will improve Outcome: Progressing Goal: Cardiovascular complication will be avoided Outcome: Progressing   Problem: Activity: Goal: Risk for activity intolerance will decrease Outcome: Progressing   Problem: Nutrition: Goal: Adequate nutrition will be  maintained Outcome: Progressing   Problem: Coping: Goal: Level of anxiety will decrease Outcome: Progressing   Problem: Elimination: Goal: Will not experience complications related to bowel motility Outcome: Progressing Goal: Will not experience complications related to urinary retention Outcome: Progressing   Problem: Pain Managment: Goal: General experience of comfort will improve and/or be controlled Outcome: Progressing   Problem: Safety: Goal: Ability to remain free from injury will improve Outcome: Progressing   Problem: Skin Integrity: Goal: Risk for impaired skin integrity will decrease Outcome: Progressing   Problem: Education: Goal: Ability to describe self-care measures that may prevent or decrease complications (Diabetes Survival Skills Education) will improve Outcome: Progressing Goal: Individualized Educational Video(s) Outcome: Progressing   Problem: Coping: Goal: Ability to adjust to condition or change in health will improve Outcome: Progressing   Problem: Fluid Volume: Goal: Ability to maintain a balanced intake and output will improve Outcome: Progressing   Problem: Health Behavior/Discharge Planning: Goal: Ability to identify and utilize available resources and services will improve Outcome: Progressing Goal: Ability to manage health-related needs will improve Outcome: Progressing   Problem: Metabolic: Goal: Ability to maintain appropriate glucose levels will improve Outcome: Progressing   Problem: Nutritional: Goal: Maintenance of adequate nutrition will improve Outcome: Progressing Goal: Progress toward achieving an optimal weight will improve Outcome: Progressing   Problem: Skin Integrity: Goal: Risk for impaired skin integrity will decrease Outcome: Progressing   Problem: Tissue Perfusion: Goal: Adequacy of tissue perfusion will improve Outcome: Progressing   "

## 2024-04-27 NOTE — TOC Progression Note (Signed)
 Transition of Care [facility]) - Progression Note    Patient Details  Name: [name] [name] MRN: [id number] Date of Birth: 1965-11-20  Transition of [facility]) CM/SW Contact  Doneta Glenys DASEN, RN Phone Number: 04/27/2024, 10:49 AM  Clinical Narrative:    CM called [place] Ms. Deaton [phone]. Ms. Drury requested patients H&P.  H&P was faxed [phone] via [employer].     Barriers to Discharge: SNF Pending bed offer               Expected Discharge Plan and Services                                               Social Drivers of Health (SDOH) Interventions SDOH Screenings   Food Insecurity: Patient Unable To Answer (03/07/2024)  Housing: Unknown (03/07/2024)  Transportation Needs: Patient Unable To Answer (03/07/2024)  Utilities: Patient Unable To Answer (03/07/2024)  Depression (PHQ2-9): High Risk (01/26/2024)  Social Connections: Unknown (09/22/2021)   Received from [facility]  Tobacco Use: Low Risk (03/13/2024)    Readmission Risk Interventions     No data to display

## 2024-04-27 NOTE — Progress Notes (Signed)
 Physical Therapy Treatment Patient Details Name: [name] [name] MRN: [id number] DOB: 08-11-1965 Today's Date: 04/27/2024   History of Present Illness Pt is a 58 y/o male presenting on 10/24 for fall and chronic back pain. 10/19 psychiatry consulted due to suicidal thoughts, cleared on 10/22. Change in mental status due to acute delirium, and admitted from ED with acute metabolic encephalopathy. CT negative, EEG negative, MRI pending, chest x ray with hypoventilation. PMH includes: bipolar disorder type 1, tardive dyskinesia, PTSD, MDD, CAD, DM2, OA, tremors.    PT Comments  Pt admitted with above diagnosis.  Pt currently with functional limitations due to the deficits listed below (see PT Problem List). Pt in bed when PT arrived. Pt agreeable to therapy intervention. Pt required increased time and S for supine to sit, pt required min A x 2 for initial sit to stand  form EOB with repetition pt able to progress to CGA and cues, pt able to participate with static standing NMR tasks with B UE integration, crossing midline, changing levels, reaching outside BOS with 1 UE support then no UE support with close S to CGA and cues no overt LOB and ball toss, gait tasks in hallway with CGA to close S min cues for posture, RW management and obstacle navigation with pt exhibiting step almost through pattern with good foot clearance and BOS 150 feet x 2. Pt elected to return to bed and all needs in place.  Pt will benefit from acute skilled PT to increase their independence and safety with mobility to allow discharge.     If plan is discharge home, recommend the following: Assistance with cooking/housework;Assist for transportation;Help with stairs or ramp for entrance;Direct supervision/assist for medications management;A little help with walking and/or transfers;A little help with bathing/dressing/bathroom;Direct supervision/assist for financial management   Can travel by private vehicle        Equipment  Recommendations  None recommended by PT    Recommendations for Other Services OT consult     Precautions / Restrictions Precautions Precautions: Fall Recall of Precautions/Restrictions: Impaired Precaution/Restrictions Comments: Parkinson's Restrictions Weight Bearing Restrictions Per Provider Order: No Other Position/Activity Restrictions: chronic back pain, history of Parkinson's disease     Mobility  Bed Mobility Overal bed mobility: Needs Assistance Bed Mobility: Sit to Supine       Sit to supine: Supervision   General bed mobility comments: min cues, flat bed for sit to supine and increased time and use of bed rail    Transfers Overall transfer level: Needs assistance Equipment used: Rolling walker (2 wheels) Transfers: Sit to/from Stand Sit to Stand: Min assist, +2 physical assistance, +2 safety/equipment, From elevated surface           General transfer comment: min A x 2 for initial sit to stand from EOB, subsequent sit to stand with increased ease progressing to CGA cues for anterior weight shift and WB through anterior portion of feet with pt demonstrating improved initial standing balance with repittition    Ambulation/Gait Ambulation/Gait assistance: Contact guard assist, Supervision Gait Distance (Feet): 150 Feet (x2) Assistive device: Rolling walker (2 wheels) Gait Pattern/deviations: Step-through pattern, Decreased stride length, Trunk flexed Gait velocity: decreased     General Gait Details: pt demonstrated improved attention and hazard recognition with obstacle navigation especially on the R side  , B UE support at RW, no overt LOB, B LE decreased stride length with step almost through  pattern however B feet clearing with swing phase, min cues for safety and pt  able to maintain appropriate BOS for safety with tursn, posture and RW management   Stairs             Wheelchair Mobility     Tilt Bed    Modified Rankin (Stroke Patients  Only)       Balance Overall balance assessment: Needs assistance Sitting-balance support: Feet supported Sitting balance-Leahy Scale: Good Sitting balance - Comments: pt reported sitting EOB for breakfast this am and pt reports posterior LOB and unable to recover with nursing providing assist, no overt LOB with sitting tasks with PT for NMR without UE support weight shifting and reaching through all planes   Standing balance support: Bilateral upper extremity supported, During functional activity, Reliant on assistive device for balance Standing balance-Leahy Scale: Fair Standing balance comment: CGA to S with use of RW for dynamic mobiltiy tasks, PT guided pt through standing NMR tasks progressing from one UE support to no UE support reaching outside BOS, overhead, changing levels and crossing midline no overt LOB pt able to progress to static standing no UE support and reaching as above and ball toss with BOS [facility]                            Communication Communication Communication: No apparent difficulties  Cognition Arousal: Alert Behavior During Therapy: Flat affect, WFL for tasks assessed/performed   PT - Cognitive impairments: Orientation                       PT - Cognition Comments: pt agreeable and appears to have improved orientation to current location and self awareness of deficits. Following commands: Impaired Following commands impaired: Follows one step commands with increased time    Cueing Cueing Techniques: Verbal cues, Gestural cues, Tactile cues  Exercises      General Comments        Pertinent Vitals/Pain Pain Assessment Pain Assessment: No/denies pain (no pain report of behaviors noted pt reports the recliner hurts his back and bottom)    Home Living                          Prior Function            PT Goals (current goals can now be found in the care plan section) Acute Rehab PT Goals Patient Stated Goal: Decreased  back pain; no more falls Progress towards PT goals: Progressing toward goals    Frequency    Min 2X/week      PT Plan      Co-evaluation              AM-PAC PT 6 Clicks Mobility   Outcome Measure  Help needed turning from your back to your side while in a flat bed without using bedrails?: A Little Help needed moving from lying on your back to sitting on the side of a flat bed without using bedrails?: A Little Help needed moving to and from a bed to a chair (including a wheelchair)?: A Little Help needed standing up from a chair using your arms (e.g., wheelchair or bedside chair)?: A Little Help needed to walk in hospital room?: A Little Help needed climbing 3-5 steps with a railing? : A Lot 6 Click Score: 17    End of Session Equipment Utilized During Treatment: Gait belt Activity Tolerance: Patient tolerated treatment well Patient left: in bed;with call bell/phone within reach;with bed  alarm set Nurse Communication: Mobility status PT Visit Diagnosis: History of falling (Z91.81);Unsteadiness on feet (R26.81);Other abnormalities of gait and mobility (R26.89);Muscle weakness (generalized) (M62.81);Repeated falls (R29.6)     Time: 1040-1103 PT Time Calculation (min) (ACUTE ONLY): 23 min  Charges:    $Gait Training: 8-22 mins $Neuromuscular Re-education: 8-22 mins PT General Charges $$ ACUTE PT VISIT: 1 Visit                     [place], PT Acute Rehab    [place] VEAR Drone 04/27/2024, 1:27 PM

## 2024-04-27 NOTE — Progress Notes (Signed)
 ICM Supervisor received a call from APS worker Terris Gosling- she reports making contact with a facility that is considering bed offer- Sandie Sear [phone]- Ms Deaton from the facility is the contact- ICM will follow up with Ms Deaton

## 2024-04-27 NOTE — Plan of Care (Signed)

## 2024-04-28 DIAGNOSIS — G9340 Encephalopathy, unspecified: Secondary | ICD-10-CM | POA: Diagnosis not present

## 2024-04-28 NOTE — Plan of Care (Signed)

## 2024-04-28 NOTE — Progress Notes (Signed)
 Mobility Specialist Progress Note:   04/28/24 1517  Mobility  Activity Pivoted/transferred to/from [facility]  Level of Assistance Minimal assist, patient does 75% or more  Assistive Device  (HHA)  Distance Ambulated (ft) 2 ft  Activity Response Tolerated well  Mobility Referral Yes  Mobility visit 1 Mobility  Mobility Specialist Start Time (ACUTE ONLY) 1435  Mobility Specialist Stop Time (ACUTE ONLY) 1443  Mobility Specialist Time Calculation (min) (ACUTE ONLY) 8 min   Pt was received on BSC and requested to get back in bed. Min A sit to stand. Returned to bed with all needs met. Call bell in reach and bed alarm on.  [employer] - Mobility Specialist

## 2024-04-28 NOTE — Progress Notes (Signed)
 Mobility Specialist Progress Note:   04/28/24 1108  Mobility  Activity Ambulated with assistance  Level of Assistance Minimal assist, patient does 75% or more  Assistive Device Front wheel walker  Distance Ambulated (ft) 140 ft  Activity Response Tolerated well  Mobility Referral Yes  Mobility visit 1 Mobility  Mobility Specialist Start Time (ACUTE ONLY) 1050  Mobility Specialist Stop Time (ACUTE ONLY) 1105  Mobility Specialist Time Calculation (min) (ACUTE ONLY) 15 min   Pt was received in bed and agreed to mobility. Min A bed mobility and sit to stand. No complaints during ambulation. Returned to bed with all needs met. Call bell in reach and bed alarm on.  [employer] - Mobility Specialist

## 2024-04-28 NOTE — Progress Notes (Signed)
 " PROGRESS NOTE    [name] [name]  FMW:[id number] DOB: 1966/02/28 DOA: 03/02/2024 PCP: [name], [name]   Brief Narrative:  58 year old M with PMH of type I bipolar disorder, tardive dyskinesia, PTSD, MDD, GAD, DM-2, tremor, frequent falls and hyperlipidemia was brought to ED by EMS on 10/18 due to talking crazy and altered mental status.    Per EDP note, roommate/friend reported problems with the patient having frequent falls in the house, difficulty caring for himself, erratic behavior including today where he had a very loud outburst in front of her, calling her names, and then was overheard stating he may kill himself.  She is concerned because he has access to 2 guns in the house.     Patient boarded in the emergency room since 10/18. 10/19, psychiatry consulted as patient was endorsing suicidal thoughts-IVC 10/22, psychiatry cleared for discharge. 10/24, patient was noted to be encephalopathic so admitted to the hospital.  11/4, MRI under sedation, transient hypotension and altered mental status-overnight in ICU. 11/5, clinically improved.  Sedation wore off.  Transferred back to MedSurg bed.     Has no capacity. Two-physician DNR on 03/27/2024 after discussion with palliative medicine given patient's multiple medical comorbidity. APS involved.  Waiting on placement.  Assessment & Plan:   Acute metabolic encephalopathy in patient with underlying psychiatric disorder -Felt to be multifactorial including positive underlying cognitive impairment, dehydration, delirium and iatrogenic. CTA and MRI brain negative for acute findings. EEG X3 negative. TSH, B12 within normal limits. Delirium/fall precautions   Bipolar disorder/MDD/GAD/PTSD - Psychiatry has seen the patient during this hospitalization and has signed off.  Continue Depakote , duloxetine , hydroxyzine , propranolol  and valbenazine .  Outpatient follow-up with psychiatry.  Mental status currently stable.  Continue Haldol  as  needed  Hypotension secondary to sedation--resolved  Hyperlipidemia Hypertension -Continue statin and propranolol   Diabetes mellitus type 2 - A1c 6.3.  Continue metformin   Hypoxemia - Resolved  Medical decision making -Does not necessarily demonstrate any capacity to make his own medical decision. APS is involved   Goals of care - Previously to physicians signed DNR.  Palliative care also was involved in the care and followed the patient during this hospitalization   DVT prophylaxis: Lovenox  Code Status: DNR Family Communication: None at bedside Disposition Plan: Status is: Inpatient Remains inpatient appropriate because: Of severity of illness.  Awaiting on APS guardianship and SNF placement.    Consultants: Neurology/PCCM/psychiatry/palliative care  Procedures: As discussed above  Antimicrobials: None   Subjective: Patient seen and examined at bedside.  Poor historian.  No agitation or vomiting reported. objective: Vitals:   04/27/24 1225 04/27/24 2033 04/27/24 2034 04/28/24 0505  BP: 104/69 96/71 103/68 137/79  Pulse: 72 85 82 (!) 56  Resp: 18 18  18   Temp: 97.7 F (36.5 C) 98.3 F (36.8 C)  98.3 F (36.8 C)  TempSrc: Oral Oral    SpO2: 96% 92%  96%  Weight:      Height:        Intake/Output Summary (Last 24 hours) at 04/28/2024 0819 Last data filed at 04/27/2024 1815 Gross per 24 hour  Intake 680 ml  Output --  Net 680 ml   Filed Weights   03/07/24 2229 03/26/24 1715  Weight: 87.4 kg 83 kg    Examination:  General: Remains on room air and in no distress.  Extremely slow to respond. Very poor historian. respiratory: Decreased breath sounds at bases bilaterally some crackles  CVS: Rate mostly controlled currently; S1 and S2 are  heard abdominal: Soft, nontender, has some distention; no organomegaly; bowel sounds normally heard currently  extremities: Mild lower extremity edema present; no cyanosis  Data Reviewed: I have personally reviewed  following labs and imaging studies  CBC: No results for input(s): WBC, NEUTROABS, HGB, HCT, MCV, PLT in the last 168 hours. Basic Metabolic Panel: No results for input(s): NA, K, CL, CO2, GLUCOSE, BUN, CREATININE, CALCIUM , MG, PHOS in the last 168 hours. GFR: CrCl cannot be calculated (Patient's most recent lab result is older than the maximum 21 days allowed.). Liver Function Tests: No results for input(s): AST, ALT, ALKPHOS, BILITOT, PROT, ALBUMIN  in the last 168 hours. No results for input(s): LIPASE, AMYLASE in the last 168 hours. No results for input(s): AMMONIA in the last 168 hours. Coagulation Profile: No results for input(s): INR, PROTIME in the last 168 hours. Cardiac Enzymes: No results for input(s): CKTOTAL, CKMB, CKMBINDEX, TROPONINI in the last 168 hours. BNP (last 3 results) No results for input(s): PROBNP in the last 8760 hours. HbA1C: No results for input(s): HGBA1C in the last 72 hours. CBG: No results for input(s): GLUCAP in the last 168 hours. Lipid Profile: No results for input(s): CHOL, HDL, LDLCALC, TRIG, CHOLHDL, LDLDIRECT in the last 72 hours. Thyroid  Function Tests: No results for input(s): TSH, T4TOTAL, FREET4, T3FREE, THYROIDAB in the last 72 hours. Anemia Panel: No results for input(s): VITAMINB12, FOLATE, FERRITIN, TIBC, IRON, RETICCTPCT in the last 72 hours. Sepsis Labs: No results for input(s): PROCALCITON, LATICACIDVEN in the last 168 hours.  Recent Results (from the past 240 hours)  MRSA Next Gen by PCR, Nasal     Status: None   Collection Time: 04/26/24  9:26 PM   Specimen: Nasal Mucosa; Nasal Swab  Result Value Ref Range Status   MRSA by PCR Next Gen NOT DETECTED NOT DETECTED Final    Comment: (NOTE) The GeneXpert MRSA Assay (FDA approved for NASAL specimens only), is one component of a comprehensive MRSA colonization  surveillance program. It is not intended to diagnose MRSA infection nor to guide or monitor treatment for MRSA infections. Test performance is not FDA approved in patients less than 71 years old. Performed at [facility], [address], [place], [place] [address]          Radiology Studies: No results found.      Scheduled Meds:  atorvastatin   20 mg Oral Daily   divalproex   625 mg Oral Q8H   DULoxetine   60 mg Oral BID   enoxaparin  (LOVENOX ) injection  40 mg Subcutaneous Q24H   hydrOXYzine   50 mg Oral QHS   metFORMIN   500 mg Oral BID WC   multivitamin with minerals  1 tablet Oral Daily   propranolol   10 mg Oral BID   valbenazine   80 mg Oral Daily   Continuous Infusions:        Sophie Mao, MD [facility] 04/28/2024, 8:19 AM    "

## 2024-04-28 NOTE — Plan of Care (Signed)
 " Problem: Health Behavior/Discharge Planning: Goal: Ability to manage health-related needs will improve 04/28/2024 0217 by Eliane Vola HERO, RN Outcome: Progressing 04/28/2024 0217 by Eliane Vola HERO, RN Outcome: Progressing   Problem: Clinical Measurements: Goal: Ability to maintain clinical measurements within normal limits will improve 04/28/2024 0217 by Eliane Vola HERO, RN Outcome: Progressing 04/28/2024 0217 by Eliane Vola HERO, RN Outcome: Progressing Goal: Will remain free from infection 04/28/2024 0217 by Eliane Vola HERO, RN Outcome: Progressing 04/28/2024 0217 by Eliane Vola HERO, RN Outcome: Progressing Goal: Diagnostic test results will improve 04/28/2024 0217 by Eliane Vola HERO, RN Outcome: Progressing 04/28/2024 0217 by Eliane Vola HERO, RN Outcome: Progressing Goal: Respiratory complications will improve 04/28/2024 0217 by Eliane Vola HERO, RN Outcome: Progressing 04/28/2024 0217 by Eliane Vola HERO, RN Outcome: Progressing Goal: Cardiovascular complication will be avoided 04/28/2024 0217 by Eliane Vola HERO, RN Outcome: Progressing 04/28/2024 0217 by Eliane Vola HERO, RN Outcome: Progressing   Problem: Activity: Goal: Risk for activity intolerance will decrease 04/28/2024 0217 by Eliane Vola HERO, RN Outcome: Progressing 04/28/2024 0217 by Eliane Vola HERO, RN Outcome: Progressing   Problem: Nutrition: Goal: Adequate nutrition will be maintained 04/28/2024 0217 by Eliane Vola HERO, RN Outcome: Progressing 04/28/2024 0217 by Eliane Vola HERO, RN Outcome: Progressing   Problem: Coping: Goal: Level of anxiety will decrease 04/28/2024 0217 by Eliane Vola HERO, RN Outcome: Progressing 04/28/2024 0217 by Eliane Vola HERO, RN Outcome: Progressing   Problem: Elimination: Goal: Will not experience complications related to bowel motility 04/28/2024 0217 by Eliane Vola HERO, RN Outcome: Progressing 04/28/2024 0217 by Eliane Vola HERO, RN Outcome: Progressing Goal: Will not experience complications related to urinary retention 04/28/2024 0217 by Eliane Vola HERO, RN Outcome: Progressing 04/28/2024 0217 by Eliane Vola HERO, RN Outcome: Progressing   Problem: Pain Managment: Goal: General experience of comfort will improve and/or be controlled 04/28/2024 0217 by Eliane Vola HERO, RN Outcome: Progressing 04/28/2024 0217 by Eliane Vola HERO, RN Outcome: Progressing   Problem: Safety: Goal: Ability to remain free from injury will improve 04/28/2024 0217 by Eliane Vola HERO, RN Outcome: Progressing 04/28/2024 0217 by Eliane Vola HERO, RN Outcome: Progressing   Problem: Skin Integrity: Goal: Risk for impaired skin integrity will decrease 04/28/2024 0217 by Eliane Vola HERO, RN Outcome: Progressing 04/28/2024 0217 by Eliane Vola HERO, RN Outcome: Progressing   Problem: Education: Goal: Ability to describe self-care measures that may prevent or decrease complications (Diabetes Survival Skills Education) will improve 04/28/2024 0217 by Eliane Vola HERO, RN Outcome: Progressing 04/28/2024 0217 by Eliane Vola HERO, RN Outcome: Progressing Goal: Individualized Educational Video(s) 04/28/2024 0217 by Eliane Vola HERO, RN Outcome: Progressing 04/28/2024 0217 by Eliane Vola HERO, RN Outcome: Progressing   Problem: Coping: Goal: Ability to adjust to condition or change in health will improve 04/28/2024 0217 by Eliane Vola HERO, RN Outcome: Progressing 04/28/2024 0217 by Eliane Vola HERO, RN Outcome: Progressing   Problem: Fluid Volume: Goal: Ability to maintain a balanced intake and output will improve 04/28/2024 0217 by Eliane Vola HERO, RN Outcome: Progressing 04/28/2024 0217 by Eliane Vola HERO, RN Outcome: Progressing   Problem: Health Behavior/Discharge Planning: Goal: Ability to identify and utilize available resources and services will improve 04/28/2024 0217 by Eliane Vola HERO, RN Outcome: Progressing 04/28/2024 0217 by Eliane Vola HERO, RN Outcome: Progressing Goal: Ability to manage health-related needs will improve 04/28/2024 0217 by Eliane Vola HERO, RN Outcome: Progressing 04/28/2024 0217 by Eliane Vola HERO, RN Outcome: Progressing   Problem: Metabolic: Goal: Ability to maintain  appropriate glucose levels will improve 04/28/2024 0217 by Eliane Vola HERO, RN Outcome: Progressing 04/28/2024 0217 by Eliane Vola HERO, RN Outcome: Progressing   Problem: Nutritional: Goal: Maintenance of adequate nutrition will improve 04/28/2024 0217 by Eliane Vola HERO, RN Outcome: Progressing 04/28/2024 0217 by Eliane Vola HERO, RN Outcome: Progressing Goal: Progress toward achieving an optimal weight will improve 04/28/2024 0217 by Eliane Vola HERO, RN Outcome: Progressing 04/28/2024 0217 by Eliane Vola HERO, RN Outcome: Progressing   Problem: Skin Integrity: Goal: Risk for impaired skin integrity will decrease 04/28/2024 0217 by Eliane Vola HERO, RN Outcome: Progressing 04/28/2024 0217 by Eliane Vola HERO, RN Outcome: Progressing   Problem: Tissue Perfusion: Goal: Adequacy of tissue perfusion will improve 04/28/2024 0217 by Eliane Vola HERO, RN Outcome: Progressing 04/28/2024 0217 by Eliane Vola HERO, RN Outcome: Progressing   "

## 2024-04-29 DIAGNOSIS — F319 Bipolar disorder, unspecified: Secondary | ICD-10-CM | POA: Diagnosis not present

## 2024-04-29 DIAGNOSIS — I959 Hypotension, unspecified: Secondary | ICD-10-CM | POA: Diagnosis not present

## 2024-04-29 NOTE — Plan of Care (Signed)
" °  Problem: Clinical Measurements: Goal: Ability to maintain clinical measurements within normal limits will improve Outcome: Progressing   Problem: Activity: Goal: Risk for activity intolerance will decrease Outcome: Progressing   Problem: Nutrition: Goal: Adequate nutrition will be maintained Outcome: Progressing   Problem: Coping: Goal: Level of anxiety will decrease Outcome: Progressing   Problem: Nutritional: Goal: Maintenance of adequate nutrition will improve Outcome: Progressing   "

## 2024-04-29 NOTE — Plan of Care (Signed)

## 2024-04-29 NOTE — Progress Notes (Signed)
 " PROGRESS NOTE    [name] [name]  FMW:[id number] DOB: Dec 05, 1965 DOA: 03/02/2024 PCP: [name], [name]   Brief Narrative:  58 year old M with PMH of type I bipolar disorder, tardive dyskinesia, PTSD, MDD, GAD, DM-2, tremor, frequent falls and hyperlipidemia was brought to ED by EMS on 10/18 due to talking crazy and altered mental status.    Per EDP note, roommate/friend reported problems with the patient having frequent falls in the house, difficulty caring for himself, erratic behavior including today where he had a very loud outburst in front of her, calling her names, and then was overheard stating he may kill himself.  She is concerned because he has access to 2 guns in the house.     Patient boarded in the emergency room since 10/18. 10/19, psychiatry consulted as patient was endorsing suicidal thoughts-IVC 10/22, psychiatry cleared for discharge. 10/24, patient was noted to be encephalopathic so admitted to the hospital.  11/4, MRI under sedation, transient hypotension and altered mental status-overnight in ICU. 11/5, clinically improved.  Sedation wore off.  Transferred back to MedSurg bed.     Has no capacity. Two-physician DNR on 03/27/2024 after discussion with palliative medicine given patient's multiple medical comorbidity. APS involved.  Waiting on placement.  Assessment & Plan:   Acute metabolic encephalopathy in patient with underlying psychiatric disorder Felt to be multifactorial including positive underlying cognitive impairment, dehydration, delirium and iatrogenic. CTA and MRI brain negative for acute findings. EEG X3 negative. TSH, B12 within normal limits. Delirium/fall precautions  Mentation has been stable for the past several days.  Bipolar disorder/MDD/GAD/PTSD Psychiatry has seen the patient during this hospitalization and has signed off.  Continue Depakote , duloxetine , hydroxyzine , propranolol  and valbenazine .  Outpatient follow-up with psychiatry.   Mental status currently stable.  Continue Haldol  as needed  Hypotension secondary to sedation--resolved  Hyperlipidemia Hypertension -Continue statin and propranolol   Diabetes mellitus type 2 - A1c 6.3.  Continue metformin   Hypoxemia - Resolved  Medical decision making -Does not necessarily demonstrate any capacity to make his own medical decision. APS is involved   Goals of care Previously 2 physicians signed DNR.  Palliative care also was involved in the care and followed the patient during this hospitalization.   DVT prophylaxis: Lovenox  Code Status: DNR Family Communication: None at bedside Disposition Plan: SNF is pending.  APS is involved.  TOC is following.    Consultants: Neurology/PCCM/psychiatry/palliative care  Procedures: As discussed above  Antimicrobials: None   Subjective: No complaints offered by patient.    Objective: Vitals:   04/28/24 1409 04/28/24 1957 04/28/24 2130 04/29/24 0503  BP: 121/83 103/75 105/61 112/60  Pulse: 67 71 72 (!) 55  Resp: 16 15  19   Temp: 97.8 F (36.6 C) 98.1 F (36.7 C)  97.6 F (36.4 C)  TempSrc: Oral Oral  Oral  SpO2: 96% 93%  94%  Weight:      Height:        Intake/Output Summary (Last 24 hours) at 04/29/2024 1156 Last data filed at 04/28/2024 1358 Gross per 24 hour  Intake 590 ml  Output --  Net 590 ml   Filed Weights   03/07/24 2229 03/26/24 1715  Weight: 87.4 kg 83 kg    Examination:  General appearance: Awake alert.  In no distress Resp: Clear to auscultation bilaterally.  Normal effort Cardio: S1-S2 is normal regular.  No S3-S4.  No rubs murmurs or bruit GI: Abdomen is soft.  Nontender nondistended.  Bowel sounds are present normal.  No masses organomegaly   Data Reviewed: I have personally reviewed following labs and reports of imaging studies  CBC: No results for input(s): WBC, NEUTROABS, HGB, HCT, MCV, PLT in the last 168 hours. Basic Metabolic Panel: No results for  input(s): NA, K, CL, CO2, GLUCOSE, BUN, CREATININE, CALCIUM , MG, PHOS in the last 168 hours. GFR: CrCl cannot be calculated (Patient's most recent lab result is older than the maximum 21 days allowed.). Liver Function Tests: No results for input(s): AST, ALT, ALKPHOS, BILITOT, PROT, ALBUMIN  in the last 168 hours.   Recent Results (from the past 240 hours)  MRSA Next Gen by PCR, Nasal     Status: None   Collection Time: 04/26/24  9:26 PM   Specimen: Nasal Mucosa; Nasal Swab  Result Value Ref Range Status   MRSA by PCR Next Gen NOT DETECTED NOT DETECTED Final    Comment: (NOTE) The GeneXpert MRSA Assay (FDA approved for NASAL specimens only), is one component of a comprehensive MRSA colonization surveillance program. It is not intended to diagnose MRSA infection nor to guide or monitor treatment for MRSA infections. Test performance is not FDA approved in patients less than 3 years old. Performed at [facility], [address], [place], [place] [address]      Radiology Studies: No results found.  Scheduled Meds:  atorvastatin   20 mg Oral Daily   divalproex   625 mg Oral Q8H   DULoxetine   60 mg Oral BID   enoxaparin  (LOVENOX ) injection  40 mg Subcutaneous Q24H   hydrOXYzine   50 mg Oral QHS   metFORMIN   500 mg Oral BID WC   multivitamin with minerals  1 tablet Oral Daily   propranolol   10 mg Oral BID   valbenazine   80 mg Oral Daily   Continuous Infusions:  Takelia Urieta, MD [facility] 04/29/2024, 11:56 AM  "

## 2024-04-30 LAB — COMPREHENSIVE METABOLIC PANEL WITH GFR
ALT: 28 U/L (ref 0–44)
AST: 24 U/L (ref 15–41)
Albumin: 3.6 g/dL (ref 3.5–5.0)
Alkaline Phosphatase: 50 U/L (ref 38–126)
Anion gap: 8 (ref 5–15)
BUN: 15 mg/dL (ref 6–20)
CO2: 28 mmol/L (ref 22–32)
Calcium: 9 mg/dL (ref 8.9–10.3)
Chloride: 100 mmol/L (ref 98–111)
Creatinine, Ser: 0.72 mg/dL (ref 0.61–1.24)
GFR, Estimated: >60 mL/min
Glucose, Bld: 85 mg/dL (ref 70–99)
Potassium: 4.3 mmol/L (ref 3.5–5.1)
Sodium: 136 mmol/L (ref 135–145)
Total Bilirubin: 0.5 mg/dL (ref 0.0–1.2)
Total Protein: 5.9 g/dL — ABNORMAL LOW (ref 6.5–8.1)

## 2024-04-30 LAB — CBC
HCT: 44.2 % (ref 39.0–52.0)
Hemoglobin: 14.9 g/dL (ref 13.0–17.0)
MCH: 28.9 pg (ref 26.0–34.0)
MCHC: 33.7 g/dL (ref 30.0–36.0)
MCV: 85.7 fL (ref 80.0–100.0)
Platelets: 184 K/uL (ref 150–400)
RBC: 5.16 MIL/uL (ref 4.22–5.81)
RDW: 15.4 % (ref 11.5–15.5)
WBC: 8.5 K/uL (ref 4.0–10.5)
nRBC: 0 % (ref 0.0–0.2)

## 2024-04-30 LAB — MAGNESIUM: Magnesium: 1.7 mg/dL (ref 1.7–2.4)

## 2024-04-30 LAB — PHOSPHORUS: Phosphorus: 3.6 mg/dL (ref 2.5–4.6)

## 2024-04-30 MED ORDER — PROPRANOLOL HCL 10 MG PO TABS: 10.0000 mg | ORAL_TABLET | Freq: Two times a day (BID) | ORAL | 0 refills | Status: DC

## 2024-04-30 MED ORDER — ATORVASTATIN CALCIUM 20 MG PO TABS: 20.0000 mg | ORAL_TABLET | Freq: Every day | ORAL | 0 refills | Status: DC

## 2024-04-30 MED ORDER — TRAZODONE HCL 50 MG PO TABS: 50.0000 mg | ORAL_TABLET | Freq: Every evening | ORAL | 0 refills | Status: DC | PRN

## 2024-04-30 MED ORDER — VALBENAZINE TOSYLATE 80 MG PO CAPS: 80.0000 mg | ORAL_CAPSULE | Freq: Every day | ORAL | 0 refills | Status: DC

## 2024-04-30 MED ORDER — HYDROXYZINE HCL 50 MG PO TABS: 50.0000 mg | ORAL_TABLET | Freq: Every day | ORAL | 0 refills | Status: DC

## 2024-04-30 MED ORDER — METFORMIN HCL 500 MG PO TABS: 500.0000 mg | ORAL_TABLET | Freq: Two times a day (BID) | ORAL | 0 refills | Status: DC

## 2024-04-30 MED ORDER — DIVALPROEX SODIUM 125 MG PO DR TAB: 625.0000 mg | DELAYED_RELEASE_TABLET | Freq: Three times a day (TID) | ORAL | 0 refills | Status: DC

## 2024-04-30 MED ORDER — DULOXETINE HCL 60 MG PO CPEP: 60.0000 mg | ORAL_CAPSULE | Freq: Two times a day (BID) | ORAL | 0 refills | Status: DC

## 2024-04-30 NOTE — TOC Progression Note (Signed)
 Transition of Care [facility]) - Progression Note    Patient Details  Name: [name] [name] MRN: [id number] Date of Birth: January 20, 1966  [facility] CM/SW Contact  Doneta Glenys DASEN, RN Phone Number: 04/30/2024, 8:36 AM  Clinical Narrative:    CM called Sandie Lady Ms. Deaton [phone] left message with Candace.     Barriers to Discharge: SNF Pending bed offer               Expected Discharge Plan and Services                                               Social Drivers of Health (SDOH) Interventions SDOH Screenings   Food Insecurity: Patient Unable To Answer (03/07/2024)  Housing: Unknown (03/07/2024)  Transportation Needs: Patient Unable To Answer (03/07/2024)  Utilities: Patient Unable To Answer (03/07/2024)  Depression (PHQ2-9): High Risk (01/26/2024)  Social Connections: Unknown (09/22/2021)   Received from [facility]  Tobacco Use: Low Risk (03/13/2024)    Readmission Risk Interventions     No data to display

## 2024-04-30 NOTE — Plan of Care (Signed)
   Problem: Health Behavior/Discharge Planning: Goal: Ability to manage health-related needs will improve Outcome: Progressing   Problem: Clinical Measurements: Goal: Will remain free from infection Outcome: Progressing   Problem: Clinical Measurements: Goal: Respiratory complications will improve Outcome: Progressing

## 2024-04-30 NOTE — Progress Notes (Signed)
 " PROGRESS NOTE    [name] [name]  FMW:[id number] DOB: [birth year] DOA: 03/02/2024 PCP: [name], [name]   Brief Narrative:  58 year old M with PMH of type I bipolar disorder, tardive dyskinesia, PTSD, MDD, GAD, DM-2, tremor, frequent falls and hyperlipidemia was brought to ED by EMS on 10/18 due to talking crazy and altered mental status.    Per EDP note, roommate/friend reported problems with the patient having frequent falls in the house, difficulty caring for himself, erratic behavior including today where he had a very loud outburst in front of her, calling her names, and then was overheard stating he may kill himself.  She is concerned because he has access to 2 guns in the house.     Patient boarded in the emergency room since 10/18. 10/19, psychiatry consulted as patient was endorsing suicidal thoughts-IVC 10/22, psychiatry cleared for discharge. 10/24, patient was noted to be encephalopathic so admitted to the hospital.  11/4, MRI under sedation, transient hypotension and altered mental status-overnight in ICU. 11/5, clinically improved.  Sedation wore off.  Transferred back to MedSurg bed.     Has no capacity. Two-physician DNR on 03/27/2024 after discussion with palliative medicine given patient's multiple medical comorbidity. APS involved.  Waiting on placement.  Assessment & Plan:   Acute metabolic encephalopathy in patient with underlying psychiatric disorder Felt to be multifactorial including positive underlying cognitive impairment, dehydration, delirium and iatrogenic. CTA and MRI brain negative for acute findings. EEG X3 negative. TSH, B12 within normal limits. Delirium/fall precautions  Mentation has been stable for the past several days.  Bipolar disorder/MDD/GAD/PTSD Psychiatry has seen the patient during this hospitalization and has signed off.  Continue Depakote , duloxetine , hydroxyzine , propranolol  and valbenazine .  Outpatient follow-up with psychiatry.   Mental status currently stable.  Continue Haldol  as needed  Hypotension secondary to sedation--resolved  Hyperlipidemia Hypertension -Continue statin and propranolol  Electrolytes are stable.  Renal function is normal.  CBC is normal.  Diabetes mellitus type 2 - A1c 6.3.  Continue metformin   Hypoxemia - Resolved  Medical decision making -Does not necessarily demonstrate any capacity to make his own medical decision. APS is involved   Goals of care Previously 2 physicians signed DNR.  Palliative care also was involved in the care and followed the patient during this hospitalization.   DVT prophylaxis: Lovenox  Code Status: DNR Family Communication: None at bedside Disposition Plan: SNF is pending.  APS is involved.  TOC is following.    Consultants: Neurology/PCCM/psychiatry/palliative care  Procedures: As discussed above  Antimicrobials: None   Subjective: No complaints offered.  Up-to-date on his bowel movements.  Objective: Vitals:   04/29/24 1322 04/29/24 2108 04/30/24 0429 04/30/24 1022  BP: 108/77 105/71 109/73 103/68  Pulse: (!) 59 62 (!) 53 63  Resp: 18 18 18    Temp: 97.6 F (36.4 C) (!) 97.3 F (36.3 C) 98.1 F (36.7 C)   TempSrc:      SpO2: 98% 95% 94%   Weight:      Height:        Intake/Output Summary (Last 24 hours) at 04/30/2024 1026 Last data filed at 04/30/2024 0800 Gross per 24 hour  Intake 120 ml  Output --  Net 120 ml   Filed Weights   03/07/24 2229 03/26/24 1715  Weight: 87.4 kg 83 kg    Examination: Appears to be in no distress.   Data Reviewed: I have personally reviewed following labs and reports of imaging studies  CBC: Recent Labs  Lab 04/30/24 0357  WBC 8.5  HGB 14.9  HCT 44.2  MCV 85.7  PLT 184   Basic Metabolic Panel: Recent Labs  Lab 04/30/24 0357  NA 136  K 4.3  CL 100  CO2 28  GLUCOSE 85  BUN 15  CREATININE 0.72  CALCIUM  9.0  MG 1.7  PHOS 3.6   GFR: Estimated Creatinine Clearance: 102.7  mL/min (by C-G formula based on SCr of 0.72 mg/dL). Liver Function Tests: Recent Labs  Lab 04/30/24 0357  AST 24  ALT 28  ALKPHOS 50  BILITOT 0.5  PROT 5.9*  ALBUMIN  3.6     Recent Results (from the past 240 hours)  MRSA Next Gen by PCR, Nasal     Status: None   Collection Time: 04/26/24  9:26 PM   Specimen: Nasal Mucosa; Nasal Swab  Result Value Ref Range Status   MRSA by PCR Next Gen NOT DETECTED NOT DETECTED Final    Comment: (NOTE) The GeneXpert MRSA Assay (FDA approved for NASAL specimens only), is one component of a comprehensive MRSA colonization surveillance program. It is not intended to diagnose MRSA infection nor to guide or monitor treatment for MRSA infections. Test performance is not FDA approved in patients less than 58 years old. Performed at [facility], [address], [place], [place] [address]      Radiology Studies: No results found.  Scheduled Meds:  atorvastatin   20 mg Oral Daily   divalproex   625 mg Oral Q8H   DULoxetine   60 mg Oral BID   enoxaparin  (LOVENOX ) injection  40 mg Subcutaneous Q24H   hydrOXYzine   50 mg Oral QHS   metFORMIN   500 mg Oral BID WC   multivitamin with minerals  1 tablet Oral Daily   propranolol   10 mg Oral BID   valbenazine   80 mg Oral Daily   Continuous Infusions:  This is a no charge note.  Joette Pebbles, MD [facility] 04/30/2024, 10:26 AM  "

## 2024-04-30 NOTE — Plan of Care (Addendum)
 Pt alert and oriented x 3, reorientation provided, denies pain, refused to be OOB during shift, turns self, moderate PO intake, vitals stable. Safety and fall precautions maintained.   Problem: Health Behavior/Discharge Planning: Goal: Ability to manage health-related needs will improve Outcome: Progressing   Problem: Clinical Measurements: Goal: Ability to maintain clinical measurements within normal limits will improve Outcome: Progressing Goal: Will remain free from infection Outcome: Progressing Goal: Diagnostic test results will improve Outcome: Progressing Goal: Respiratory complications will improve Outcome: Progressing Goal: Cardiovascular complication will be avoided Outcome: Progressing   Problem: Activity: Goal: Risk for activity intolerance will decrease Outcome: Progressing   Problem: Nutrition: Goal: Adequate nutrition will be maintained Outcome: Progressing   Problem: Coping: Goal: Level of anxiety will decrease Outcome: Progressing   Problem: Elimination: Goal: Will not experience complications related to bowel motility Outcome: Progressing Goal: Will not experience complications related to urinary retention Outcome: Progressing   Problem: Pain Managment: Goal: General experience of comfort will improve and/or be controlled Outcome: Progressing   Problem: Safety: Goal: Ability to remain free from injury will improve Outcome: Progressing   Problem: Skin Integrity: Goal: Risk for impaired skin integrity will decrease Outcome: Progressing   Problem: Education: Goal: Ability to describe self-care measures that may prevent or decrease complications (Diabetes Survival Skills Education) will improve Outcome: Progressing Goal: Individualized Educational Video(s) Outcome: Progressing   Problem: Coping: Goal: Ability to adjust to condition or change in health will improve Outcome: Progressing   Problem: Fluid Volume: Goal: Ability to maintain a  balanced intake and output will improve Outcome: Progressing   Problem: Health Behavior/Discharge Planning: Goal: Ability to identify and utilize available resources and services will improve Outcome: Progressing Goal: Ability to manage health-related needs will improve Outcome: Progressing   Problem: Metabolic: Goal: Ability to maintain appropriate glucose levels will improve Outcome: Progressing   Problem: Nutritional: Goal: Maintenance of adequate nutrition will improve Outcome: Progressing Goal: Progress toward achieving an optimal weight will improve Outcome: Progressing   Problem: Skin Integrity: Goal: Risk for impaired skin integrity will decrease Outcome: Progressing   Problem: Tissue Perfusion: Goal: Adequacy of tissue perfusion will improve Outcome: Progressing

## 2024-05-01 NOTE — Plan of Care (Signed)

## 2024-05-01 NOTE — TOC Progression Note (Addendum)
 Transition of Care [facility]) - Progression Note    Patient Details  Name: [name] [name] MRN: [id number] Date of Birth: [birth year]  [facility]) CM/SW Contact  Doneta Glenys DASEN, RN Phone Number: 05/01/2024, 8:12 AM  Clinical Narrative:   CM also sent APS Terris Gosling [phone],  information.  Sandie Lady Ms. Deaton requested H&P, Progress note be email to [email or web address]). Ms. Drury reply received. CM called Sandie Lady Ms. Deaton [phone] left message      Barriers to Discharge: SNF Pending bed offer               Expected Discharge Plan and Services                                               Social Drivers of Health (SDOH) Interventions SDOH Screenings   Food Insecurity: Patient Unable To Answer (03/07/2024)  Housing: Unknown (03/07/2024)  Transportation Needs: Patient Unable To Answer (03/07/2024)  Utilities: Patient Unable To Answer (03/07/2024)  Depression (PHQ2-9): High Risk (01/26/2024)  Social Connections: Unknown (09/22/2021)   Received from [facility]  Tobacco Use: Low Risk (03/13/2024)    Readmission Risk Interventions     No data to display

## 2024-05-01 NOTE — Plan of Care (Signed)
  Problem: Health Behavior/Discharge Planning: Goal: Ability to manage health-related needs will improve Outcome: Progressing   Problem: Clinical Measurements: Goal: Ability to maintain clinical measurements within normal limits will improve Outcome: Progressing   Problem: Clinical Measurements: Goal: Diagnostic test results will improve Outcome: Progressing   

## 2024-05-01 NOTE — Progress Notes (Signed)
 Occupational Therapy Treatment Patient Details Name: [name] [name] MRN: [id number] DOB: [birth year] Today's Date: 05/01/2024   History of present illness Pt is a 58 yr old male presenting on 10/24 for fall and chronic back pain. Admitted from ED with acute metabolic encephalopathy. PMH includes: bipolar disorder type 1, tardive dyskinesia, PTSD, MDD, CAD, DM2, OA, tremors.   OT comments  The pt was seen for progression of functional activity and ADL participation. He required min assist for supine to sit. Once seated EOB, he presented with intermittent posterior leaning, requiring min assist and cues to shift trunk forward and to place feet on the floor. He subsequently required min assist for donning a hospital gown around his back, min assist to stand using a RW, and CGA for functional ambulation using a RW. He presented with good effort and participation. He is making gradual functional progress. Continue OT plan of care. Patient will benefit from continued inpatient follow up therapy, <3 hours/day.         If plan is discharge home, recommend the following:  A little help with walking and/or transfers;Assistance with cooking/housework;Direct supervision/assist for medications management;Direct supervision/assist for financial management;Assist for transportation;Help with stairs or ramp for entrance;A little help with bathing/dressing/bathroom   Equipment Recommendations  Other (comment) (defer to next setting)    Recommendations for Other Services      Precautions / Restrictions Precautions Precautions: Fall Restrictions Other Position/Activity Restrictions: chronic back pain, history of Parkinson's disease       Mobility Bed Mobility Overal bed mobility: Needs Assistance Bed Mobility: Supine to Sit     Supine to sit: HOB elevated, Used rails, Min assist Sit to supine: Supervision   General bed mobility comments: Once seated EOB, the pt presented with intermittent  posterior leaning, requiring min assist and cues to shift trunk forward and to place feet on the floor    Transfers Overall transfer level: Needs assistance Equipment used: Rolling walker (2 wheels) Transfers: Sit to/from Stand Sit to Stand: Min assist, From elevated surface        Balance       Sitting balance - Comments:  (initially poor due to intermittent posterior lean, however improved to fair)       Standing balance comment: CGA to min assist with RW         ADL either performed or assessed with clinical judgement   ADL Overall ADL's : Needs assistance/impaired Eating/Feeding: Set up;Sitting               Upper Body Dressing : Minimal assistance;Sitting Upper Body Dressing Details (indicate cue type and reason): The pt required assist to donn a hospital gown around his back seated edge of bed. Lower Body Dressing: Moderate assistance;Sitting/lateral leans Lower Body Dressing Details (indicate cue type and reason): For management of socks in sitting.                     Vision Baseline Vision/History: 1 Wears glasses           Communication Communication Communication: No apparent difficulties   Cognition Arousal: Alert Behavior During Therapy: Flat affect, WFL for tasks assessed/performed       Awareness: Online awareness impaired   Attention impairment (select first level of impairment): Divided attention        Following commands: Impaired Following commands impaired: Follows one step commands with increased time      Cueing   Cueing Techniques: Verbal cues, Gestural cues, Tactile cues  Pertinent Vitals/ Pain       Pain Assessment Pain Assessment: No/denies pain   Frequency  Min 2X/week        Progress Toward Goals  OT Goals(current goals can now be found in the care plan section)  Progress towards OT goals: Progressing toward goals  Acute Rehab OT Goals Patient Stated Goal: to return home OT Goal  Formulation: With patient Time For Goal Achievement: 05/06/24 Potential to Achieve Goals: Good  Plan         AM-PAC OT 6 Clicks Daily Activity     Outcome Measure   Help from another person eating meals?: None Help from another person taking care of personal grooming?: A Little Help from another person toileting, which includes using toliet, bedpan, or urinal?: A Little Help from another person bathing (including washing, rinsing, drying)?: A Little Help from another person to put on and taking off regular upper body clothing?: A Little Help from another person to put on and taking off regular lower body clothing?: A Lot 6 Click Score: 18    End of Session Equipment Utilized During Treatment: Gait belt;Rolling walker (2 wheels)  OT Visit Diagnosis: Other abnormalities of gait and mobility (R26.89);Unsteadiness on feet (R26.81);Muscle weakness (generalized) (M62.81);History of falling (Z91.81);Other symptoms and signs involving cognitive function   Activity Tolerance Patient tolerated treatment well   Patient Left in bed;with call bell/phone within reach;with bed alarm set   Nurse Communication Mobility status        Time: 1004-1015 OT Time Calculation (min): 11 min  Charges: OT Evaluation $OT Eval Low Complexity: 1 Low OT Treatments $Therapeutic Activity: 8-22 mins     [name], OTR/L 05/01/2024, 12:01 PM

## 2024-05-01 NOTE — Progress Notes (Signed)
 Physical Therapy Treatment Patient Details Name: [name] [name] MRN: [id number] DOB: 09/11/1965 Today's Date: 05/01/2024   History of Present Illness Pt is a 58 y/o male presenting on 10/24 for fall and chronic back pain. 10/19 psychiatry consulted due to suicidal thoughts, cleared on 10/22. Change in mental status due to acute delirium, and admitted from ED with acute metabolic encephalopathy. CT negative, EEG negative, MRI pending, chest x ray with hypoventilation. PMH includes: bipolar disorder type 1, tardive dyskinesia, PTSD, MDD, CAD, DM2, OA, tremors.    PT Comments   Pt is AxO x 3 pleasant and following all instructions.  He even followed instructions to pull call light when done on toilet and wait till I return.  He was polite.  Thank You and engaging.  His biggest c/o was his hands (tremors and difficulty feeding self without making a big mess).  Much improved gait with alternating steps.   General bed mobility comments: Pt with increasing ability to initiate but still requiring Min Assist for upper body due to his Parkinson's rigidity esp with hip/trunk flexion.  He tends to get stuck half way.  Back to bed, he does better as he uses gravity and quick movements to throw himself back to bed.  Which is very typical for Parkinson's Pt's.  General transfer comment: Pt still requires Min Assist with sit to stand (Parkinson's related) and exhibits posterior lean with poor self correction reaction time to correct.  Pt is able to self stand to sit on toilet/bed with enough control that causes no harm.  He correctly uses his hands to prevent a harsh sit.  General Gait Details: Pt's gait show improvement of increased stride length and upright posture.  His steps are alternating and his feet have improved floor clearance.  He correctly uses a RW for slight balance instability and requires less instruction on safety with turns and proper walker to self distance.  He tolerated the  distance well.  TOC working on placement.     If plan is discharge home, recommend the following: Assistance with cooking/housework;Assist for transportation;Help with stairs or ramp for entrance;Direct supervision/assist for medications management;A little help with walking and/or transfers;A little help with bathing/dressing/bathroom;Direct supervision/assist for financial management   Can travel by private vehicle     Yes  Equipment Recommendations  None recommended by PT    Recommendations for Other Services       Precautions / Restrictions Precautions Precautions: Fall Precaution/Restrictions Comments: Parkinson's Restrictions Weight Bearing Restrictions Per Provider Order: No     Mobility  Bed Mobility Overal bed mobility: Needs Assistance Bed Mobility: Supine to Sit     Supine to sit: Min assist     General bed mobility comments: Pt with increasing ability to initiate but still requiring Min Assist for upper body due to his Parkinson's rigidity esp with hip/trunk flexion.  He tends to get stuck half way.  Back to bed, he does better as he uses gravity and quick movements to throw himself back to bed.  Which is very typical for Parkinson's Pt's.    Transfers Overall transfer level: Needs assistance Equipment used: Rolling walker (2 wheels) Transfers: Sit to/from Stand Sit to Stand: Min assist, From elevated surface           General transfer comment: Pt still requires Min Assist with sit to stand (Parkinson's related) and exhibits posterior lean with poor self correction reaction time to correct.  Pt is able to self stand to sit on toilet/bed with  enough control that causes no harm.  He correctly uses his hands to prevent a harsh sit.    Ambulation/Gait Ambulation/Gait assistance: Supervision, Contact guard assist Gait Distance (Feet): 225 Feet Assistive device: Rolling walker (2 wheels) Gait Pattern/deviations: Step-through pattern, Decreased stride  length, Trunk flexed Gait velocity: decreased     General Gait Details: Pt's gait show improvement of increased stride length and upright posture.  His steps are alternating and his feet have improved floor clearance.  He correctly uses a RW for slight balance instability and requires less instruction on safety with turns and proper walker to self distance.  He tolerated the distance well.   Stairs             Wheelchair Mobility     Tilt Bed    Modified Rankin (Stroke Patients Only)       Balance                                            Communication Communication Communication: No apparent difficulties  Cognition Arousal: Alert Behavior During Therapy: WFL for tasks assessed/performed   PT - Cognitive impairments: No apparent impairments                       PT - Cognition Comments: Pt is AxO x 3 pleasant and following all instructions.  He even followed instructions to pull call light when done on toilet and wait till I return.  He was polite.  Thank You and engaging.  His biggest c/o was his hands (tremors and difficulty feeding self without making a big mess).  Much improved gait with alternating steps. Following commands: Intact Following commands impaired: Only follows one step commands consistently    Cueing    Exercises      General Comments        Pertinent Vitals/Pain Pain Assessment Pain Assessment: No/denies pain    Home Living                          Prior Function            PT Goals (current goals can now be found in the care plan section) Progress towards PT goals: Progressing toward goals    Frequency    Min 2X/week      PT Plan      Co-evaluation              AM-PAC PT 6 Clicks Mobility   Outcome Measure  Help needed turning from your back to your side while in a flat bed without using bedrails?: A Little Help needed moving from lying on your back to sitting on the  side of a flat bed without using bedrails?: A Little Help needed moving to and from a bed to a chair (including a wheelchair)?: A Little Help needed standing up from a chair using your arms (e.g., wheelchair or bedside chair)?: A Little Help needed to walk in hospital room?: A Little Help needed climbing 3-5 steps with a railing? : A Little 6 Click Score: 18    End of Session Equipment Utilized During Treatment: Gait belt Activity Tolerance: Patient tolerated treatment well Patient left: in bed;with call bell/phone within reach;with bed alarm set Nurse Communication: Mobility status PT Visit Diagnosis: History of falling (Z91.81);Unsteadiness on feet (R26.81);Other abnormalities of gait  and mobility (R26.89);Muscle weakness (generalized) (M62.81);Repeated falls (R29.6)     Time: 8477-8454 PT Time Calculation (min) (ACUTE ONLY): 23 min  Charges:    $Gait Training: 8-22 mins $Therapeutic Activity: 8-22 mins PT General Charges $$ ACUTE PT VISIT: 1 Visit                     {Jhoselyn Ruffini  PTA Acute  Rehabilitation Services Office M-F          [phone]

## 2024-05-01 NOTE — Progress Notes (Signed)
 " PROGRESS NOTE    [name] [name]  FMW:[id number] DOB: Mar 03, 1966 DOA: 03/02/2024 PCP: [name], [name]   Brief Narrative:  58 year old M with PMH of type I bipolar disorder, tardive dyskinesia, PTSD, MDD, GAD, DM-2, tremor, frequent falls and hyperlipidemia was brought to ED by EMS on 10/18 due to talking crazy and altered mental status.    Per EDP note, roommate/friend reported problems with the patient having frequent falls in the house, difficulty caring for himself, erratic behavior including today where he had a very loud outburst in front of her, calling her names, and then was overheard stating he may kill himself.  She is concerned because he has access to 2 guns in the house.     Patient boarded in the emergency room since 10/18. 10/19, psychiatry consulted as patient was endorsing suicidal thoughts-IVC 10/22, psychiatry cleared for discharge. 10/24, patient was noted to be encephalopathic so admitted to the hospital.  11/4, MRI under sedation, transient hypotension and altered mental status-overnight in ICU. 11/5, clinically improved.  Sedation wore off.  Transferred back to MedSurg bed.     Has no capacity. Two-physician DNR on 03/27/2024 after discussion with palliative medicine given patient's multiple medical comorbidity. APS involved.  Waiting on placement.  Assessment & Plan:   Acute metabolic encephalopathy in patient with underlying psychiatric disorder Felt to be multifactorial including positive underlying cognitive impairment, dehydration, delirium and iatrogenic. CTA and MRI brain negative for acute findings. EEG X3 negative. TSH, B12 within normal limits. Delirium/fall precautions  Mentation has been stable for the past several days.  Bipolar disorder/MDD/GAD/PTSD Psychiatry has seen the patient during this hospitalization and has signed off.  Continue Depakote , duloxetine , hydroxyzine , propranolol  and valbenazine .  Outpatient follow-up with psychiatry.   Mental status currently stable.  Continue Haldol  as needed  Hypotension secondary to sedation--resolved  Hyperlipidemia Hypertension -Continue statin and propranolol  Electrolytes are stable.  Renal function is normal.  CBC is normal.  Diabetes mellitus type 2 - A1c 6.3.  Continue metformin   Hypoxemia - Resolved  Medical decision making -Does not necessarily demonstrate any capacity to make his own medical decision. APS is involved   Goals of care Previously 2 physicians signed DNR.  Palliative care also was involved in the care and followed the patient during this hospitalization.   DVT prophylaxis: Lovenox  Code Status: DNR Family Communication: None at bedside Disposition Plan: SNF is pending.  APS is involved.  TOC is following.    Consultants: Neurology/PCCM/psychiatry/palliative care  Procedures: As discussed above  Antimicrobials: None   Subjective: No overnight events reported.  Objective: Vitals:   04/30/24 1022 04/30/24 1204 04/30/24 1918 05/01/24 0500  BP: 103/68 (!) 146/129 108/61 113/73  Pulse: 63 60 74 (!) 55  Resp:   16 16  Temp:  (!) 97.3 F (36.3 C) 98 F (36.7 C) 97.7 F (36.5 C)  TempSrc:  Oral    SpO2:  97% 96% 96%  Weight:      Height:        Intake/Output Summary (Last 24 hours) at 05/01/2024 1130 Last data filed at 04/30/2024 1818 Gross per 24 hour  Intake 360 ml  Output --  Net 360 ml   Filed Weights   03/07/24 2229 03/26/24 1715  Weight: 87.4 kg 83 kg    Examination: In no distress   Data Reviewed: I have personally reviewed following labs and reports of imaging studies  CBC: Recent Labs  Lab 04/30/24 0357  WBC 8.5  HGB 14.9  HCT  44.2  MCV 85.7  PLT 184   Basic Metabolic Panel: Recent Labs  Lab 04/30/24 0357  NA 136  K 4.3  CL 100  CO2 28  GLUCOSE 85  BUN 15  CREATININE 0.72  CALCIUM  9.0  MG 1.7  PHOS 3.6   GFR: Estimated Creatinine Clearance: 102.7 mL/min (by C-G formula based on SCr of 0.72  mg/dL). Liver Function Tests: Recent Labs  Lab 04/30/24 0357  AST 24  ALT 28  ALKPHOS 50  BILITOT 0.5  PROT 5.9*  ALBUMIN  3.6     Recent Results (from the past 240 hours)  MRSA Next Gen by PCR, Nasal     Status: None   Collection Time: 04/26/24  9:26 PM   Specimen: Nasal Mucosa; Nasal Swab  Result Value Ref Range Status   MRSA by PCR Next Gen NOT DETECTED NOT DETECTED Final    Comment: (NOTE) The GeneXpert MRSA Assay (FDA approved for NASAL specimens only), is one component of a comprehensive MRSA colonization surveillance program. It is not intended to diagnose MRSA infection nor to guide or monitor treatment for MRSA infections. Test performance is not FDA approved in patients less than 61 years old. Performed at [facility], [address] [place], [place] [address]      Radiology Studies: No results found.  Scheduled Meds:  atorvastatin   20 mg Oral Daily   divalproex   625 mg Oral Q8H   DULoxetine   60 mg Oral BID   enoxaparin  (LOVENOX ) injection  40 mg Subcutaneous Q24H   hydrOXYzine   50 mg Oral QHS   metFORMIN   500 mg Oral BID WC   multivitamin with minerals  1 tablet Oral Daily   propranolol   10 mg Oral BID   valbenazine   80 mg Oral Daily   Continuous Infusions:  This is a no charge note.  Joette Pebbles, MD [facility] 05/01/2024, 11:30 AM  "

## 2024-05-02 NOTE — Progress Notes (Signed)
 Patient B/P 108/75  HR 66-Inderal  po med held as per recommendation  Dr. Verdene. Pt resting comfortably; B/P and HR retakened this am B/P113/66 HR80-pt resting without distress.

## 2024-05-02 NOTE — Plan of Care (Signed)

## 2024-05-02 NOTE — Plan of Care (Signed)
   Problem: Health Behavior/Discharge Planning: Goal: Ability to manage health-related needs will improve Outcome: Progressing   Problem: Clinical Measurements: Goal: Ability to maintain clinical measurements within normal limits will improve Outcome: Progressing Goal: Will remain free from infection Outcome: Progressing Goal: Diagnostic test results will improve Outcome: Progressing Goal: Respiratory complications will improve Outcome: Progressing

## 2024-05-02 NOTE — Progress Notes (Signed)
 " PROGRESS NOTE    [name] [name]  FMW:[id number] DOB: 03-17-1966 DOA: 03/02/2024 PCP: [name], [name]   Brief Narrative:  58 year old M with PMH of type I bipolar disorder, tardive dyskinesia, PTSD, MDD, GAD, DM-2, tremor, frequent falls and hyperlipidemia was brought to ED by EMS on 10/18 due to talking crazy and altered mental status.    Per EDP note, roommate/friend reported problems with the patient having frequent falls in the house, difficulty caring for himself, erratic behavior including today where he had a very loud outburst in front of her, calling her names, and then was overheard stating he may kill himself.  She is concerned because he has access to 2 guns in the house.     Patient boarded in the emergency room since 10/18. 10/19, psychiatry consulted as patient was endorsing suicidal thoughts-IVC 10/22, psychiatry cleared for discharge. 10/24, patient was noted to be encephalopathic so admitted to the hospital.  11/4, MRI under sedation, transient hypotension and altered mental status-overnight in ICU. 11/5, clinically improved.  Sedation wore off.  Transferred back to MedSurg bed.     Has no capacity. Two-physician DNR on 03/27/2024 after discussion with palliative medicine given patient's multiple medical comorbidity. APS involved.  Waiting on placement.  Assessment & Plan:   Acute metabolic encephalopathy in patient with underlying psychiatric disorder Felt to be multifactorial including positive underlying cognitive impairment, dehydration, delirium and iatrogenic. CTA and MRI brain negative for acute findings. EEG X3 negative. TSH, B12 within normal limits. Delirium/fall precautions  Mentation has been stable for the past several days.  Bipolar disorder/MDD/GAD/PTSD Psychiatry has seen the patient during this hospitalization and has signed off.   Continue Depakote , duloxetine , hydroxyzine , propranolol  and valbenazine .   Outpatient follow-up with psychiatry.   Mental status currently stable.  Continue Haldol  as needed  Hypotension secondary to sedation--resolved. Patient noted to be on propranolol .  Dose may need to be adjusted.  Will monitor blood pressure trends for now.  Hyperlipidemia Hypertension -Continue statin and propranolol  Electrolytes are stable.  Renal function is normal.  CBC is normal.  Diabetes mellitus type 2 - A1c 6.3.  Continue metformin   Hypoxemia - Resolved  Medical decision making -Does not necessarily demonstrate any capacity to make his own medical decision. APS is involved   Goals of care Previously 2 physicians signed DNR.  Palliative care also was involved in the care and followed the patient during this hospitalization.   DVT prophylaxis: Lovenox  Code Status: DNR Family Communication: None at bedside Disposition Plan: SNF is pending.  APS is involved.  TOC is following.    Consultants: Neurology/PCCM/psychiatry/palliative care  Procedures: As discussed above  Antimicrobials: None   Subjective: No overnight events reported.  Objective: Vitals:   05/01/24 2243 05/02/24 0057 05/02/24 0527 05/02/24 0939  BP: 108/75 116/75 113/66 128/77  Pulse: 66 66 61 80  Resp:   18   Temp:   97.7 F (36.5 C)   TempSrc:   Oral   SpO2:   97%   Weight:      Height:        Intake/Output Summary (Last 24 hours) at 05/02/2024 1100 Last data filed at 05/02/2024 0900 Gross per 24 hour  Intake 240 ml  Output --  Net 240 ml   Filed Weights   03/07/24 2229 03/26/24 1715  Weight: 87.4 kg 83 kg    Examination: In no distress   Data Reviewed: I have personally reviewed following labs and reports of imaging studies  CBC:  Recent Labs  Lab 04/30/24 0357  WBC 8.5  HGB 14.9  HCT 44.2  MCV 85.7  PLT 184   Basic Metabolic Panel: Recent Labs  Lab 04/30/24 0357  NA 136  K 4.3  CL 100  CO2 28  GLUCOSE 85  BUN 15  CREATININE 0.72  CALCIUM  9.0  MG 1.7  PHOS 3.6   GFR: Estimated Creatinine  Clearance: 102.7 mL/min (by C-G formula based on SCr of 0.72 mg/dL). Liver Function Tests: Recent Labs  Lab 04/30/24 0357  AST 24  ALT 28  ALKPHOS 50  BILITOT 0.5  PROT 5.9*  ALBUMIN  3.6     Recent Results (from the past 240 hours)  MRSA Next Gen by PCR, Nasal     Status: None   Collection Time: 04/26/24  9:26 PM   Specimen: Nasal Mucosa; Nasal Swab  Result Value Ref Range Status   MRSA by PCR Next Gen NOT DETECTED NOT DETECTED Final    Comment: (NOTE) The GeneXpert MRSA Assay (FDA approved for NASAL specimens only), is one component of a comprehensive MRSA colonization surveillance program. It is not intended to diagnose MRSA infection nor to guide or monitor treatment for MRSA infections. Test performance is not FDA approved in patients less than 15 years old. Performed at [facility], [address], [place], [place] [address]      Radiology Studies: No results found.  Scheduled Meds:  atorvastatin   20 mg Oral Daily   divalproex   625 mg Oral Q8H   DULoxetine   60 mg Oral BID   enoxaparin  (LOVENOX ) injection  40 mg Subcutaneous Q24H   hydrOXYzine   50 mg Oral QHS   metFORMIN   500 mg Oral BID WC   multivitamin with minerals  1 tablet Oral Daily   propranolol   10 mg Oral BID   valbenazine   80 mg Oral Daily   Continuous Infusions:  This is a no charge note.  Joette Pebbles, MD [facility] 05/02/2024, 11:00 AM  "

## 2024-05-03 NOTE — Plan of Care (Signed)

## 2024-05-03 NOTE — Progress Notes (Signed)
 " PROGRESS NOTE    [name] [name]  FMW:[id number] DOB: March 10, 1966 DOA: 03/02/2024 PCP: [name], [name]   Brief Narrative:  58 year old M with PMH of type I bipolar disorder, tardive dyskinesia, PTSD, MDD, GAD, DM-2, tremor, frequent falls and hyperlipidemia was brought to ED by EMS on 10/18 due to talking crazy and altered mental status.    Per EDP note, roommate/friend reported problems with the patient having frequent falls in the house, difficulty caring for himself, erratic behavior including today where he had a very loud outburst in front of her, calling her names, and then was overheard stating he may kill himself.  She is concerned because he has access to 2 guns in the house.     Patient boarded in the emergency room since 10/18. 10/19, psychiatry consulted as patient was endorsing suicidal thoughts-IVC 10/22, psychiatry cleared for discharge. 10/24, patient was noted to be encephalopathic so admitted to the hospital.  11/4, MRI under sedation, transient hypotension and altered mental status-overnight in ICU. 11/5, clinically improved.  Sedation wore off.  Transferred back to MedSurg bed.     Has no capacity. Two-physician DNR on 03/27/2024 after discussion with palliative medicine given patient's multiple medical comorbidity. APS involved.  Waiting on placement.  Assessment & Plan:   Acute metabolic encephalopathy in patient with underlying psychiatric disorder Felt to be multifactorial including positive underlying cognitive impairment, dehydration, delirium and iatrogenic. CTA and MRI brain negative for acute findings. EEG X3 negative. TSH, B12 within normal limits. Delirium/fall precautions  Mentation has been stable for the past several days.  Bipolar disorder/MDD/GAD/PTSD Psychiatry has seen the patient during this hospitalization and has signed off.   Continue Depakote , duloxetine , hydroxyzine , propranolol  and valbenazine .   Outpatient follow-up with psychiatry.   Mental status currently stable.  Continue Haldol  as needed  Hypotension secondary to sedation--resolved. Patient noted to be on propranolol .  Dose may need to be adjusted.  Will monitor blood pressure trends for now.  Hyperlipidemia Hypertension -Continue statin and propranolol  Electrolytes are stable.  Renal function is normal.  CBC is normal.  Diabetes mellitus type 2 - A1c 6.3.  Continue metformin   Hypoxemia - Resolved  Medical decision making -Does not necessarily demonstrate any capacity to make his own medical decision. APS is involved   Goals of care Previously 2 physicians signed DNR.  Palliative care also was involved in the care and followed the patient during this hospitalization.   DVT prophylaxis: Lovenox  Code Status: DNR Family Communication: None at bedside Disposition Plan: SNF is pending.  APS is involved.  TOC is following.    Consultants: Neurology/PCCM/psychiatry/palliative care  Procedures: As discussed above  Antimicrobials: None   Subjective: Feels well.  No complaints offered.  Objective: Vitals:   05/02/24 0939 05/02/24 1227 05/02/24 2024 05/03/24 0532  BP: 128/77 113/66 121/75 106/88  Pulse: 80 63 72 (!) 56  Resp:  18 16 16   Temp:  98 F (36.7 C) (!) 97.5 F (36.4 C) 97.6 F (36.4 C)  TempSrc:  Oral Oral Oral  SpO2:  96% 94% 97%  Weight:      Height:        Intake/Output Summary (Last 24 hours) at 05/03/2024 1044 Last data filed at 05/02/2024 1752 Gross per 24 hour  Intake 240 ml  Output --  Net 240 ml   Filed Weights   03/07/24 2229 03/26/24 1715  Weight: 87.4 kg 83 kg    Examination: Awake alert.  In no distress. S1-S2 is normal regular.  Lungs are clear to auscultation bilaterally.   Data Reviewed: I have personally reviewed following labs and reports of imaging studies  CBC: Recent Labs  Lab 04/30/24 0357  WBC 8.5  HGB 14.9  HCT 44.2  MCV 85.7  PLT 184   Basic Metabolic Panel: Recent Labs  Lab  04/30/24 0357  NA 136  K 4.3  CL 100  CO2 28  GLUCOSE 85  BUN 15  CREATININE 0.72  CALCIUM  9.0  MG 1.7  PHOS 3.6   GFR: Estimated Creatinine Clearance: 102.7 mL/min (by C-G formula based on SCr of 0.72 mg/dL). Liver Function Tests: Recent Labs  Lab 04/30/24 0357  AST 24  ALT 28  ALKPHOS 50  BILITOT 0.5  PROT 5.9*  ALBUMIN  3.6     Recent Results (from the past 240 hours)  MRSA Next Gen by PCR, Nasal     Status: None   Collection Time: 04/26/24  9:26 PM   Specimen: Nasal Mucosa; Nasal Swab  Result Value Ref Range Status   MRSA by PCR Next Gen NOT DETECTED NOT DETECTED Final    Comment: (NOTE) The GeneXpert MRSA Assay (FDA approved for NASAL specimens only), is one component of a comprehensive MRSA colonization surveillance program. It is not intended to diagnose MRSA infection nor to guide or monitor treatment for MRSA infections. Test performance is not FDA approved in patients less than 44 years old. Performed at [facility], [address], [place], [place] [address]      Radiology Studies: No results found.  Scheduled Meds:  atorvastatin   20 mg Oral Daily   divalproex   625 mg Oral Q8H   DULoxetine   60 mg Oral BID   enoxaparin  (LOVENOX ) injection  40 mg Subcutaneous Q24H   hydrOXYzine   50 mg Oral QHS   metFORMIN   500 mg Oral BID WC   multivitamin with minerals  1 tablet Oral Daily   propranolol   10 mg Oral BID   valbenazine   80 mg Oral Daily   Continuous Infusions:  This is a no charge note.  Joette Pebbles, MD [facility] 05/03/2024, 10:44 AM  "

## 2024-05-04 NOTE — Plan of Care (Signed)

## 2024-05-04 NOTE — Progress Notes (Addendum)
 " PROGRESS NOTE    [name] [name]  FMW:[id number] DOB: Mar 19, 1966 DOA: 03/02/2024 PCP: [name], [name]   Brief Narrative:  58 year old M with PMH of type I bipolar disorder, tardive dyskinesia, PTSD, MDD, GAD, DM-2, tremor, frequent falls and hyperlipidemia was brought to ED by EMS on 10/18 due to talking crazy and altered mental status.    Per EDP note, roommate/friend reported problems with the patient having frequent falls in the house, difficulty caring for himself, erratic behavior including today where he had a very loud outburst in front of her, calling her names, and then was overheard stating he may kill himself.  She is concerned because he has access to 2 guns in the house.     Patient boarded in the emergency room since 10/18. 10/19, psychiatry consulted as patient was endorsing suicidal thoughts-IVC 10/22, psychiatry cleared for discharge. 10/24, patient was noted to be encephalopathic so admitted to the hospital.  11/4, MRI under sedation, transient hypotension and altered mental status-overnight in ICU. 11/5, clinically improved.  Sedation wore off.  Transferred back to MedSurg bed.     Has no capacity. Two-physician DNR on 03/27/2024 after discussion with palliative medicine given patient's multiple medical comorbidity. APS involved.  Waiting on placement.  Assessment & Plan:   Acute metabolic encephalopathy in patient with underlying psychiatric disorder Felt to be multifactorial including positive underlying cognitive impairment, dehydration, delirium and iatrogenic. CTA and MRI brain negative for acute findings. EEG X3 negative. TSH, B12 within normal limits. Delirium/fall precautions  Mentation has been stable.  Bipolar disorder/MDD/GAD/PTSD Psychiatry has seen the patient during this hospitalization and has signed off.   Continue Depakote , duloxetine , hydroxyzine , propranolol  and valbenazine .   Outpatient follow-up with psychiatry.  Mental status currently  stable.  Continue Haldol  as needed  Hypotension secondary to sedation--resolved. Patient noted to be on propranolol .  Dose may need to be adjusted.  Will monitor blood pressure trends for now.  Hyperlipidemia Hypertension -Continue statin and propranolol  Electrolytes are stable.  Renal function is normal.  CBC is normal.  Diabetes mellitus type 2 - A1c 6.3.  Continue metformin   Hypoxemia - Resolved  Medical decision making -Does not necessarily demonstrate any capacity to make his own medical decision. APS is involved   Goals of care Previously 2 physicians signed DNR.  Palliative care also was involved in the care and followed the patient during this hospitalization.   DVT prophylaxis: Lovenox  Code Status: DNR Family Communication: None at bedside Disposition Plan: SNF is pending.  APS is involved.  TOC is following.    Consultants: Neurology/PCCM/psychiatry/palliative care  Procedures: As discussed above  Antimicrobials: None   Subjective: No overnight events reported.  Objective: Vitals:   05/03/24 1217 05/03/24 2111 05/03/24 2114 05/04/24 0603  BP: 104/69 104/62 103/68 101/76  Pulse: 62 62 66 (!) 54  Resp: 18  18   Temp: 98.2 F (36.8 C)  97.6 F (36.4 C) 97.7 F (36.5 C)  TempSrc: Oral  Oral Oral  SpO2: 97%  96% 95%  Weight:      Height:        Intake/Output Summary (Last 24 hours) at 05/04/2024 1043 Last data filed at 05/04/2024 0900 Gross per 24 hour  Intake 812 ml  Output --  Net 812 ml   Filed Weights   03/07/24 2229 03/26/24 1715  Weight: 87.4 kg 83 kg    Examination: No distress   Data Reviewed: I have personally reviewed following labs and reports of imaging studies  CBC: Recent Labs  Lab 04/30/24 0357  WBC 8.5  HGB 14.9  HCT 44.2  MCV 85.7  PLT 184   Basic Metabolic Panel: Recent Labs  Lab 04/30/24 0357  NA 136  K 4.3  CL 100  CO2 28  GLUCOSE 85  BUN 15  CREATININE 0.72  CALCIUM  9.0  MG 1.7  PHOS 3.6    GFR: Estimated Creatinine Clearance: 102.7 mL/min (by C-G formula based on SCr of 0.72 mg/dL). Liver Function Tests: Recent Labs  Lab 04/30/24 0357  AST 24  ALT 28  ALKPHOS 50  BILITOT 0.5  PROT 5.9*  ALBUMIN  3.6     Recent Results (from the past 240 hours)  MRSA Next Gen by PCR, Nasal     Status: None   Collection Time: 04/26/24  9:26 PM   Specimen: Nasal Mucosa; Nasal Swab  Result Value Ref Range Status   MRSA by PCR Next Gen NOT DETECTED NOT DETECTED Final    Comment: (NOTE) The GeneXpert MRSA Assay (FDA approved for NASAL specimens only), is one component of a comprehensive MRSA colonization surveillance program. It is not intended to diagnose MRSA infection nor to guide or monitor treatment for MRSA infections. Test performance is not FDA approved in patients less than 31 years old. Performed at [facility], [address], [place], [place] [address]      Radiology Studies: No results found.  Scheduled Meds:  atorvastatin   20 mg Oral Daily   divalproex   625 mg Oral Q8H   DULoxetine   60 mg Oral BID   enoxaparin  (LOVENOX ) injection  40 mg Subcutaneous Q24H   hydrOXYzine   50 mg Oral QHS   metFORMIN   500 mg Oral BID WC   multivitamin with minerals  1 tablet Oral Daily   propranolol   10 mg Oral BID   valbenazine   80 mg Oral Daily   Continuous Infusions:  This is a no charge note.  Joette Pebbles, MD [facility] 05/04/2024, 10:43 AM  "

## 2024-05-04 NOTE — Plan of Care (Signed)
" °  Problem: Health Behavior/Discharge Planning: Goal: Ability to manage health-related needs will improve Outcome: Progressing   Problem: Clinical Measurements: Goal: Ability to maintain clinical measurements within normal limits will improve Outcome: Progressing Goal: Will remain free from infection Outcome: Progressing Goal: Diagnostic test results will improve Outcome: Progressing Goal: Respiratory complications will improve Outcome: Progressing Goal: Cardiovascular complication will be avoided Outcome: Progressing   Problem: Activity: Goal: Risk for activity intolerance will decrease Outcome: Progressing   Problem: Nutrition: Goal: Adequate nutrition will be maintained Outcome: Progressing   Problem: Coping: Goal: Level of anxiety will decrease Outcome: Progressing   Problem: Elimination: Goal: Will not experience complications related to bowel motility Outcome: Progressing Goal: Will not experience complications related to urinary retention Outcome: Progressing   Problem: Pain Managment: Goal: General experience of comfort will improve and/or be controlled Outcome: Progressing   Problem: Safety: Goal: Ability to remain free from injury will improve Outcome: Progressing   Problem: Skin Integrity: Goal: Risk for impaired skin integrity will decrease Outcome: Progressing   Problem: Education: Goal: Ability to describe self-care measures that may prevent or decrease complications (Diabetes Survival Skills Education) will improve Outcome: Progressing Goal: Individualized Educational Video(s) Outcome: Progressing   Problem: Coping: Goal: Ability to adjust to condition or change in health will improve Outcome: Progressing   Problem: Health Behavior/Discharge Planning: Goal: Ability to identify and utilize available resources and services will improve Outcome: Progressing Goal: Ability to manage health-related needs will improve Outcome: Progressing    Problem: Nutritional: Goal: Maintenance of adequate nutrition will improve Outcome: Progressing Goal: Progress toward achieving an optimal weight will improve Outcome: Progressing   Problem: Skin Integrity: Goal: Risk for impaired skin integrity will decrease Outcome: Progressing   Problem: Tissue Perfusion: Goal: Adequacy of tissue perfusion will improve Outcome: Progressing   "

## 2024-05-05 DIAGNOSIS — I959 Hypotension, unspecified: Secondary | ICD-10-CM | POA: Diagnosis not present

## 2024-05-05 MED ORDER — PROPRANOLOL HCL 10 MG PO TABS
5.0000 mg | ORAL_TABLET | Freq: Two times a day (BID) | ORAL | Status: DC
  Administered 2024-05-05 – 2024-06-08 (×40): 5 mg via ORAL
  Filled 2024-05-05 (×45): qty 1

## 2024-05-05 NOTE — Plan of Care (Signed)
   Problem: Health Behavior/Discharge Planning: Goal: Ability to manage health-related needs will improve Outcome: Progressing   Problem: Clinical Measurements: Goal: Ability to maintain clinical measurements within normal limits will improve Outcome: Progressing

## 2024-05-05 NOTE — Progress Notes (Signed)
 Mobility Specialist Progress Note:   05/05/24 1259  Mobility  Activity Ambulated with assistance  Level of Assistance Minimal assist, patient does 75% or more  Assistive Device Front wheel walker  Distance Ambulated (ft) 175 ft  Activity Response Tolerated well  Mobility Referral Yes  Mobility visit 1 Mobility  Mobility Specialist Start Time (ACUTE ONLY) 1220  Mobility Specialist Stop Time (ACUTE ONLY) 1230  Mobility Specialist Time Calculation (min) (ACUTE ONLY) 10 min   Pt was received in bed and agreed to mobility. Min A sit to stand. No complaints/issues during session. Returned to bed with all needs met. Call bell in reach and bed alarm on.  [employer] - Mobility Specialist

## 2024-05-05 NOTE — Plan of Care (Signed)

## 2024-05-05 NOTE — Progress Notes (Addendum)
 " PROGRESS NOTE    [name] [name]  FMW:[id number] DOB: 1966-04-08 DOA: 03/02/2024 PCP: [name], [name]   Brief Narrative:  58 year old M with PMH of type I bipolar disorder, tardive dyskinesia, PTSD, MDD, GAD, DM-2, tremor, frequent falls and hyperlipidemia was brought to ED by EMS on 10/18 due to talking crazy and altered mental status.    Per EDP note, roommate/friend reported problems with the patient having frequent falls in the house, difficulty caring for himself, erratic behavior including today where he had a very loud outburst in front of her, calling her names, and then was overheard stating he may kill himself.  She is concerned because he has access to 2 guns in the house.     Patient boarded in the emergency room since 10/18. 10/19, psychiatry consulted as patient was endorsing suicidal thoughts-IVC 10/22, psychiatry cleared for discharge. 10/24, patient was noted to be encephalopathic so admitted to the hospital.  11/4, MRI under sedation, transient hypotension and altered mental status-overnight in ICU. 11/5, clinically improved.  Sedation wore off.  Transferred back to MedSurg bed.     Has no capacity. Two-physician DNR on 03/27/2024 after discussion with palliative medicine given patient's multiple medical comorbidity. APS involved.  Waiting on placement.  Assessment & Plan:   Acute metabolic encephalopathy in patient with underlying psychiatric disorder Felt to be multifactorial including positive underlying cognitive impairment, dehydration, delirium and iatrogenic. CTA and MRI brain negative for acute findings. EEG X3 negative. TSH, B12 within normal limits. Delirium/fall precautions  Patient has been stable.  Bipolar disorder/MDD/GAD/PTSD Psychiatry has seen the patient during this hospitalization and has signed off.   Continue Depakote , duloxetine , hydroxyzine , propranolol  and valbenazine .   Outpatient follow-up with psychiatry.  Mental status currently  stable.  Continue Haldol  as needed  Hypotension Low blood pressures initially were due to sedation.  Blood pressure then improved.  Noted to be running borderline low over the last few days. Heart rate also noted to be low.  TSH was normal in October.  Will decrease the dose of propranolol  to 5 mg twice a day and continue to monitor closely. Labs were last checked on 12/22.  Will order labs for tomorrow morning.  Hyperlipidemia Continue statin  Diabetes mellitus type 2 - A1c 6.3.  Continue metformin   Hypoxemia - Resolved  Medical decision making -Does not necessarily demonstrate any capacity to make his own medical decision. APS is involved   Goals of care Previously 2 physicians signed DNR.  Palliative care also was involved in the care and followed the patient during this hospitalization.   DVT prophylaxis: Lovenox  Code Status: DNR Family Communication: None at bedside Disposition Plan: SNF is pending.  APS is involved.  TOC is following.    Consultants: Neurology/PCCM/psychiatry/palliative care  Procedures: As discussed above  Antimicrobials: None   Subjective: No overnight events noted.  Objective: Vitals:   05/04/24 0603 05/04/24 2046 05/04/24 2230 05/05/24 0525  BP: 101/76 103/66 103/66 99/66  Pulse: (!) 54 67 67 (!) 51  Resp:  19  18  Temp: 97.7 F (36.5 C) 98.2 F (36.8 C)  (!) 97.4 F (36.3 C)  TempSrc: Oral   Oral  SpO2: 95% 94%  95%  Weight:      Height:        Intake/Output Summary (Last 24 hours) at 05/05/2024 1016 Last data filed at 05/05/2024 0941 Gross per 24 hour  Intake 600 ml  Output --  Net 600 ml   [employer]  03/07/24 2229 03/26/24 1715  Weight: 87.4 kg 83 kg    Examination: Awake alert in no distress. S1-S2 is bradycardic regular. Abdomen is soft.  Nontender nondistended.  Lungs are clear to auscultation bilaterally   Data Reviewed: I have personally reviewed following labs and reports of imaging  studies  CBC: Recent Labs  Lab 04/30/24 0357  WBC 8.5  HGB 14.9  HCT 44.2  MCV 85.7  PLT 184   Basic Metabolic Panel: Recent Labs  Lab 04/30/24 0357  NA 136  K 4.3  CL 100  CO2 28  GLUCOSE 85  BUN 15  CREATININE 0.72  CALCIUM  9.0  MG 1.7  PHOS 3.6   GFR: Estimated Creatinine Clearance: 102.7 mL/min (by C-G formula based on SCr of 0.72 mg/dL).  Liver Function Tests: Recent Labs  Lab 04/30/24 0357  AST 24  ALT 28  ALKPHOS 50  BILITOT 0.5  PROT 5.9*  ALBUMIN  3.6     Recent Results (from the past 240 hours)  MRSA Next Gen by PCR, Nasal     Status: None   Collection Time: 04/26/24  9:26 PM   Specimen: Nasal Mucosa; Nasal Swab  Result Value Ref Range Status   MRSA by PCR Next Gen NOT DETECTED NOT DETECTED Final    Comment: (NOTE) The GeneXpert MRSA Assay (FDA approved for NASAL specimens only), is one component of a comprehensive MRSA colonization surveillance program. It is not intended to diagnose MRSA infection nor to guide or monitor treatment for MRSA infections. Test performance is not FDA approved in patients less than 71 years old. Performed at [facility], [address] [place], [place] [address]      Radiology Studies: No results found.  Scheduled Meds:  atorvastatin   20 mg Oral Daily   divalproex   625 mg Oral Q8H   DULoxetine   60 mg Oral BID   enoxaparin  (LOVENOX ) injection  40 mg Subcutaneous Q24H   hydrOXYzine   50 mg Oral QHS   metFORMIN   500 mg Oral BID WC   multivitamin with minerals  1 tablet Oral Daily   propranolol   5 mg Oral BID   valbenazine   80 mg Oral Daily   Continuous Infusions:   Nalia Honeycutt, MD [facility] 05/05/2024, 10:16 AM  "

## 2024-05-06 DIAGNOSIS — G9340 Encephalopathy, unspecified: Secondary | ICD-10-CM | POA: Diagnosis not present

## 2024-05-06 LAB — BASIC METABOLIC PANEL WITH GFR
Anion gap: 8 (ref 5–15)
BUN: 14 mg/dL (ref 6–20)
CO2: 29 mmol/L (ref 22–32)
Calcium: 9.1 mg/dL (ref 8.9–10.3)
Chloride: 101 mmol/L (ref 98–111)
Creatinine, Ser: 0.71 mg/dL (ref 0.61–1.24)
GFR, Estimated: >60 mL/min
Glucose, Bld: 105 mg/dL — ABNORMAL HIGH (ref 70–99)
Potassium: 3.8 mmol/L (ref 3.5–5.1)
Sodium: 139 mmol/L (ref 135–145)

## 2024-05-06 LAB — CBC
HCT: 43.6 % (ref 39.0–52.0)
Hemoglobin: 14.7 g/dL (ref 13.0–17.0)
MCH: 29.2 pg (ref 26.0–34.0)
MCHC: 33.7 g/dL (ref 30.0–36.0)
MCV: 86.5 fL (ref 80.0–100.0)
Platelets: 164 K/uL (ref 150–400)
RBC: 5.04 MIL/uL (ref 4.22–5.81)
RDW: 15.5 % (ref 11.5–15.5)
WBC: 7.8 K/uL (ref 4.0–10.5)
nRBC: 0 % (ref 0.0–0.2)

## 2024-05-06 LAB — MAGNESIUM: Magnesium: 1.6 mg/dL — ABNORMAL LOW (ref 1.7–2.4)

## 2024-05-06 LAB — PHOSPHORUS: Phosphorus: 3.7 mg/dL (ref 2.5–4.6)

## 2024-05-06 NOTE — Plan of Care (Signed)

## 2024-05-06 NOTE — Progress Notes (Signed)
 " PROGRESS NOTE    [name] [name]  FMW:[id number] DOB: Oct 02, 1965 DOA: 03/02/2024 PCP: [name], [name]  Chief Complaint  Patient presents with   Placement    Hospital Course:  58 year old M with PMH of type I bipolar disorder, tardive dyskinesia, PTSD, MDD, GAD, DM-2, tremor, frequent falls and hyperlipidemia was brought to ED by EMS on 10/18 due to talking crazy and altered mental status.    Per EDP note, roommate/friend reported problems with the patient having frequent falls in the house, difficulty caring for himself, erratic behavior including today where he had a very loud outburst in front of her, calling her names, and then was overheard stating he may kill himself.  She is concerned because he has access to 2 guns in the house.     Patient boarded in the emergency room since 10/18. 10/19, psychiatry consulted as patient was endorsing suicidal thoughts-IVC 10/22, psychiatry cleared for discharge. 10/24, patient was noted to be encephalopathic so admitted to the hospital.  11/4, MRI under sedation, transient hypotension and altered mental status-overnight in ICU. 11/5, clinically improved.  Sedation wore off.  Transferred back to MedSurg bed. 11/18.  No capacity.  Two-physician DNR after discussion with palliative medicine given patient's multiple medical comorbidities.  APS involved. 11/18 through 12/28: Extensive workup for metabolic encephalopathy thought to be multifactorial.  No acute findings.  State has denied guardianship.  TOC working on SNF   Subjective: No acute events overnight.  Patient is sitting in bed watching TV today.  He is alert and oriented x 3   Objective: Vitals:   05/05/24 1427 05/05/24 2144 05/05/24 2153 05/06/24 0600  BP: 112/75 117/71 117/71 92/60  Pulse: 61 81 81 (!) 57  Resp: 16 18  20   Temp: 97.8 F (36.6 C) 97.8 F (36.6 C)  97.6 F (36.4 C)  TempSrc:  Oral  Oral  SpO2: 97% 95%  95%  Weight:      Height:        Intake/Output  Summary (Last 24 hours) at 05/06/2024 1034 Last data filed at 05/05/2024 1230 Gross per 24 hour  Intake 240 ml  Output --  Net 240 ml   Filed Weights   03/07/24 2229 03/26/24 1715  Weight: 87.4 kg 83 kg    Examination: General exam: Appears calm and comfortable, NAD  Respiratory system: No work of breathing, symmetric chest wall expansion Cardiovascular system: S1 & S2 heard, RRR.  Gastrointestinal system: Abdomen is nondistended, soft and nontender.  Neuro: Alert and oriented x3  Assessment & Plan:  Principal Problem:   Acute encephalopathy Active Problems:   Chronic low back pain without sciatica   Acute delirium   Generalized weakness   Bipolar 1 disorder (HCC)   Goals of care, counseling/discussion   Counseling and coordination of care   Palliative care encounter    Acute metabolic encephalopathy Underlying psychiatric disorder Bipolar 1 disorder PTSD Tardive dyskinesia PTSD MDD GAD - Acute metabolic encephalopathy thought to be multifactorial given underlying cognitive impairment complicated by dehydration, and acute delirium. - CTA and MRI brain negative for acute findings - EEG negative x 3 - TSH, B12, ammonia, thiamine  WNL - RPR: Nonreactive in 2024.  Have ordered repeat - Delirium and fall precautions - Frequent reorientation - Psychiatry is seeing the patient during this hospitalization and has signed off - Continue with Depakote , duloxetine , hydroxyzine , propranolol , valbenazine  as currently prescribed - Will need close outpatient follow-up with psychiatry - As needed Haldol   Hypomagnesemia  -  Will replace  Hypotension - Initially low BPs secondary to sedation.  BP then resolved - TSH WNL in October - Propranolol  has been decreased, continue to monitor blood pressures closely  Hyperlipidemia - Continue statin  Type 2 diabetes - Hemoglobin A1c 6.3% continue metformin   Hypoxemia - Resolved  Medical decision making - Patient does not have  capacity to make his own medical decisions.  APS is involved.  Guardianship has reportedly been denied  Goals of care - Previously two-physician signed DNR.  Palliative care was also involved in the care at that time and has followed the patient throughout this hospitalization.  Body mass index is 26.26 kg/m. Overweight - Outpatient follow up for lifestyle modification and risk factor management   DVT prophylaxis: Lovenox    Code Status: Limited: Do not attempt resuscitation (DNR) -DNR-LIMITED -Do Not Intubate/DNI  Disposition:  Inpt pending SNF  Consultants:  Treatment Team:  Consulting Physician: Fredia Dorothe HERO, MD Consulting Physician: Dasie Ellouise CROME, [name] Consulting Physician: Jeryl Skeens, MD  Procedures:    Antimicrobials:  Anti-infectives (From admission, onward)    Start     Dose/Rate Route Frequency Ordered Stop   03/12/24 1515  amoxicillin -clavulanate (AUGMENTIN ) 875-125 MG per tablet 1 tablet        1 tablet Oral Every 12 hours 03/12/24 1420 03/17/24 0959   03/12/24 1515  doxycycline  (VIBRA -TABS) tablet 100 mg  Status:  Discontinued        100 mg Oral Every 12 hours 03/12/24 1420 03/15/24 1100       Data Reviewed: I have personally reviewed following labs and imaging studies CBC: Recent Labs  Lab 04/30/24 0357 05/06/24 0503  WBC 8.5 7.8  HGB 14.9 14.7  HCT 44.2 43.6  MCV 85.7 86.5  PLT 184 164   Basic Metabolic Panel: Recent Labs  Lab 04/30/24 0357 05/06/24 0503  NA 136 139  K 4.3 3.8  CL 100 101  CO2 28 29  GLUCOSE 85 105*  BUN 15 14  CREATININE 0.72 0.71  CALCIUM  9.0 9.1  MG 1.7 1.6*  PHOS 3.6 3.7   GFR: Estimated Creatinine Clearance: 102.7 mL/min (by C-G formula based on SCr of 0.71 mg/dL). Liver Function Tests: Recent Labs  Lab 04/30/24 0357  AST 24  ALT 28  ALKPHOS 50  BILITOT 0.5  PROT 5.9*  ALBUMIN  3.6   CBG: No results for input(s): GLUCAP in the last 168 hours.  Recent Results (from the past 240 hours)  MRSA Next  Gen by PCR, Nasal     Status: None   Collection Time: 04/26/24  9:26 PM   Specimen: Nasal Mucosa; Nasal Swab  Result Value Ref Range Status   MRSA by PCR Next Gen NOT DETECTED NOT DETECTED Final    Comment: (NOTE) The GeneXpert MRSA Assay (FDA approved for NASAL specimens only), is one component of a comprehensive MRSA colonization surveillance program. It is not intended to diagnose MRSA infection nor to guide or monitor treatment for MRSA infections. Test performance is not FDA approved in patients less than 73 years old. Performed at [facility], [address] [place], [place] [address]      Radiology Studies: No results found.  Scheduled Meds:  atorvastatin   20 mg Oral Daily   divalproex   625 mg Oral Q8H   DULoxetine   60 mg Oral BID   enoxaparin  (LOVENOX ) injection  40 mg Subcutaneous Q24H   hydrOXYzine   50 mg Oral QHS   metFORMIN   500 mg Oral BID WC  multivitamin with minerals  1 tablet Oral Daily   propranolol   5 mg Oral BID   valbenazine   80 mg Oral Daily   Continuous Infusions:   LOS: 60 days  MDM: Patient is high risk for one or more organ failure.  They necessitate ongoing hospitalization for continued IV therapies and subsequent lab monitoring. Total time spent interpreting labs and vitals, reviewing the medical record, coordinating care amongst consultants and care team members, directly assessing and discussing care with the patient and/or family: 55 min  Marcio Hoque, DO [facility]  To contact the attending physician between 7A-7P please use Epic Chat. To contact the covering physician during after hours 7P-7A, please review Amion.  05/06/2024, 10:34 AM   *This document has been created with the assistance of dictation software. Please excuse typographical errors. *   "

## 2024-05-07 DIAGNOSIS — G9340 Encephalopathy, unspecified: Secondary | ICD-10-CM | POA: Diagnosis not present

## 2024-05-07 LAB — SYPHILIS: RPR W/REFLEX TO RPR TITER AND TREPONEMAL ANTIBODIES, TRADITIONAL SCREENING AND DIAGNOSIS ALGORITHM: RPR Ser Ql: NONREACTIVE

## 2024-05-07 NOTE — Progress Notes (Signed)
 " PROGRESS NOTE    [name] [name]  FMW:[id number] DOB: 1966-01-19 DOA: 03/02/2024 PCP: [name], [name]  Chief Complaint  Patient presents with   Placement    Hospital Course:  58 year old M with PMH of type I bipolar disorder, tardive dyskinesia, PTSD, MDD, GAD, DM-2, tremor, frequent falls and hyperlipidemia was brought to ED by EMS on 10/18 due to talking crazy and altered mental status.    Per EDP note, roommate/friend reported problems with the patient having frequent falls in the house, difficulty caring for himself, erratic behavior including today where he had a very loud outburst in front of her, calling her names, and then was overheard stating he may kill himself.  She is concerned because he has access to 2 guns in the house.     Patient boarded in the emergency room since 10/18. 10/19, psychiatry consulted as patient was endorsing suicidal thoughts-IVC 10/22, psychiatry cleared for discharge. 10/24, patient was noted to be encephalopathic so admitted to the hospital.  11/4, MRI under sedation, transient hypotension and altered mental status-overnight in ICU. 11/5, clinically improved.  Sedation wore off.  Transferred back to MedSurg bed. 11/18.  No capacity.  Two-physician DNR after discussion with palliative medicine given patient's multiple medical comorbidities.  APS involved. 11/18 through 12/28: Extensive workup for metabolic encephalopathy thought to be multifactorial.  No acute findings.  State has denied guardianship.  TOC working on SNF   Subjective: No acute events overnight. On evaluation today patient has no acute complaints  Objective: Vitals:   05/07/24 0408 05/07/24 0928 05/07/24 0930 05/07/24 1212  BP: 113/88 (!) 89/50 (!) 86/77 112/71  Pulse: 76 66 66 71  Resp: 12   18  Temp: (!) 97.5 F (36.4 C)  97.7 F (36.5 C) 97.9 F (36.6 C)  TempSrc: Oral  Oral Oral  SpO2: 97%  96% 97%  Weight:      Height:        Intake/Output Summary (Last 24  hours) at 05/07/2024 1619 Last data filed at 05/07/2024 1300 Gross per 24 hour  Intake 720 ml  Output --  Net 720 ml   Filed Weights   03/07/24 2229 03/26/24 1715  Weight: 87.4 kg 83 kg    Examination: General exam: Appears calm and comfortable, NAD  Respiratory system: No work of breathing, symmetric chest wall expansion Cardiovascular system: S1 & S2 heard, RRR.  Gastrointestinal system: Abdomen is nondistended, soft and nontender.  Neuro: Alert and oriented x3  Assessment & Plan:  Principal Problem:   Acute encephalopathy Active Problems:   Chronic low back pain without sciatica   Acute delirium   Generalized weakness   Bipolar 1 disorder (HCC)   Goals of care, counseling/discussion   Counseling and coordination of care   Palliative care encounter    Acute metabolic encephalopathy Underlying psychiatric disorder Bipolar 1 disorder PTSD Tardive dyskinesia PTSD MDD GAD - Acute metabolic encephalopathy thought to be multifactorial given underlying cognitive impairment complicated by dehydration, and acute delirium. - CTA and MRI brain negative for acute findings - EEG negative x 3 - TSH, B12, ammonia, thiamine  WNL - RPR: Nonreactive  - Delirium and fall precautions - Frequent reorientation - Psychiatry is seeing the patient during this hospitalization and has signed off - Continue with Depakote , duloxetine , hydroxyzine , propranolol , valbenazine  as currently prescribed - Will need close outpatient follow-up with psychiatry - As needed Haldol   Hypomagnesemia  - Will replace  Hypotension - Initially low BPs secondary to sedation.  BP  then resolved - TSH WNL in October - Propranolol  intermittently being held.  Hyperlipidemia - Continue statin  Type 2 diabetes - Hemoglobin A1c 6.3% continue metformin   Hypoxemia - Resolved  Medical decision making - Patient does not have capacity to make his own medical decisions.  APS is involved.  Guardianship has  reportedly been denied  Goals of care - Previously two-physician signed DNR.  Palliative care was also involved in the care at that time and has followed the patient throughout this hospitalization.  Body mass index is 26.26 kg/m. Overweight - Outpatient follow up for lifestyle modification and risk factor management   DVT prophylaxis: Lovenox    Code Status: Limited: Do not attempt resuscitation (DNR) -DNR-LIMITED -Do Not Intubate/DNI  Disposition:  Inpt pending SNF  Consultants:  Treatment Team:  Consulting Physician: Fredia Dorothe HERO, MD Consulting Physician: Dasie Ellouise CROME, [name] Consulting Physician: Jeryl Skeens, MD  Procedures:    Antimicrobials:  Anti-infectives (From admission, onward)    Start     Dose/Rate Route Frequency Ordered Stop   03/12/24 1515  amoxicillin -clavulanate (AUGMENTIN ) 875-125 MG per tablet 1 tablet        1 tablet Oral Every 12 hours 03/12/24 1420 03/17/24 0959   03/12/24 1515  doxycycline  (VIBRA -TABS) tablet 100 mg  Status:  Discontinued        100 mg Oral Every 12 hours 03/12/24 1420 03/15/24 1100       Data Reviewed: I have personally reviewed following labs and imaging studies CBC: Recent Labs  Lab 05/06/24 0503  WBC 7.8  HGB 14.7  HCT 43.6  MCV 86.5  PLT 164   Basic Metabolic Panel: Recent Labs  Lab 05/06/24 0503  NA 139  K 3.8  CL 101  CO2 29  GLUCOSE 105*  BUN 14  CREATININE 0.71  CALCIUM  9.1  MG 1.6*  PHOS 3.7   GFR: Estimated Creatinine Clearance: 102.7 mL/min (by C-G formula based on SCr of 0.71 mg/dL). Liver Function Tests: No results for input(s): AST, ALT, ALKPHOS, BILITOT, PROT, ALBUMIN  in the last 168 hours.  CBG: No results for input(s): GLUCAP in the last 168 hours.  No results found for this or any previous visit (from the past 240 hours).    Radiology Studies: No results found.  Scheduled Meds:  atorvastatin   20 mg Oral Daily   divalproex   625 mg Oral Q8H   DULoxetine   60 mg Oral  BID   enoxaparin  (LOVENOX ) injection  40 mg Subcutaneous Q24H   hydrOXYzine   50 mg Oral QHS   metFORMIN   500 mg Oral BID WC   multivitamin with minerals  1 tablet Oral Daily   propranolol   5 mg Oral BID   valbenazine   80 mg Oral Daily   Continuous Infusions:   LOS: 61 days  MDM: Patient is high risk for one or more organ failure.  They necessitate ongoing hospitalization for continued IV therapies and subsequent lab monitoring. Total time spent interpreting labs and vitals, reviewing the medical record, coordinating care amongst consultants and care team members, directly assessing and discussing care with the patient and/or family: 55 min  Gussie Murton, DO [facility]  To contact the attending physician between 7A-7P please use Epic Chat. To contact the covering physician during after hours 7P-7A, please review Amion.  05/07/2024, 4:19 PM   *This document has been created with the assistance of dictation software. Please excuse typographical errors. *   "

## 2024-05-07 NOTE — Progress Notes (Signed)
 Physical Therapy Treatment Patient Details Name: [name] [name] MRN: [id number] DOB: 1966/04/06 Today's Date: 05/07/2024   History of Present Illness Pt is a 58 y/o male presenting on 10/24 for fall and chronic back pain. 10/19 psychiatry consulted due to suicidal thoughts, cleared on 10/22. Change in mental status due to acute delirium, and admitted from ED with acute metabolic encephalopathy. CT negative, EEG negative, MRI pending, chest x ray with hypoventilation. PMH includes: bipolar disorder type 1, tardive dyskinesia, PTSD, MDD, CAD, DM2, OA, tremors.    PT Comments  Pt requiring encouragement to participate. States he does not feel well today.  In time pt agreed to sit EOB, then agreed to ambulate in hallway. Pt continues to require intermittent assist and cues for safety awareness. D/c plan remains appropriate at this time. Continue to follow in acute setting.    If plan is discharge home, recommend the following: Assistance with cooking/housework;Assist for transportation;Help with stairs or ramp for entrance;Direct supervision/assist for medications management;A little help with walking and/or transfers;A little help with bathing/dressing/bathroom;Direct supervision/assist for financial management   Can travel by private vehicle        Equipment Recommendations  None recommended by PT    Recommendations for Other Services       Precautions / Restrictions Precautions Precautions: Fall Recall of Precautions/Restrictions: Impaired Restrictions Weight Bearing Restrictions Per Provider Order: No     Mobility  Bed Mobility Overal bed mobility: Needs Assistance Bed Mobility: Supine to Sit     Supine to sit: Contact guard, Used rails     General bed mobility comments: pt able to initate and complete supine to sit with use of bed rail, CGA to facilitate continued forward wt translation    Transfers Overall transfer level: Needs assistance Equipment used: Rolling walker  (2 wheels) Transfers: Sit to/from Stand Sit to Stand: Min assist           General transfer comment: STS x3. pt had to return to sitting on initial standing trial.  pt able to achieve full trunk/hip/knee extension subsequent trials. cues for anterior wt translation. pt uses momentum to stand    Ambulation/Gait Ambulation/Gait assistance: Contact guard assist, Min assist Gait Distance (Feet): 240 Feet (12') Assistive device: Rolling walker (2 wheels) Gait Pattern/deviations: Decreased step length - right, Decreased step length - left, Shuffle, Trunk flexed Gait velocity: decreased     General Gait Details: step length variable but improved with incr distance; cues for proper RW position and trunk extension. CGA needed for safety   Stairs             Wheelchair Mobility     Tilt Bed    Modified Rankin (Stroke Patients Only)       Balance Overall balance assessment: Needs assistance Sitting-balance support: Feet supported, No upper extremity supported Sitting balance-Leahy Scale: Fair Sitting balance - Comments: Fair to poor - pt with repeated posterior LOB requiring assist to correct Postural control: Posterior lean Standing balance support: Bilateral upper extremity supported, During functional activity, Reliant on assistive device for balance Standing balance-Leahy Scale: Poor Standing balance comment: reliant on UEs and CGA for safety                            Communication Communication Communication: No apparent difficulties  Cognition Arousal: Alert Behavior During Therapy: WFL for tasks assessed/performed, Flat affect   PT - Cognitive impairments: Safety/Judgement  PT - Cognition Comments: pt is alert, oriented. states he does not feel well and does not want to work with PT initially. pt  eventually agreed to mobilize however demonstrates decr insight into deficits, decr safety awareness Following commands:  Impaired Following commands impaired: Only follows one step commands consistently    Cueing Cueing Techniques: Verbal cues, Tactile cues  Exercises      General Comments        Pertinent Vitals/Pain Pain Assessment Pain Assessment: No/denies pain (pt staes generally does not feel well)    Home Living                          Prior Function            PT Goals (current goals can now be found in the care plan section) Acute Rehab PT Goals Patient Stated Goal: Decreased back pain; no more falls Progress towards PT goals: Progressing toward goals    Frequency    Min 2X/week      PT Plan      Co-evaluation              AM-PAC PT 6 Clicks Mobility   Outcome Measure  Help needed turning from your back to your side while in a flat bed without using bedrails?: A Little Help needed moving from lying on your back to sitting on the side of a flat bed without using bedrails?: A Little Help needed moving to and from a bed to a chair (including a wheelchair)?: A Little Help needed standing up from a chair using your arms (e.g., wheelchair or bedside chair)?: A Little Help needed to walk in hospital room?: A Little Help needed climbing 3-5 steps with a railing? : A Little 6 Click Score: 18    End of Session Equipment Utilized During Treatment: Gait belt Activity Tolerance: Patient tolerated treatment well Patient left: in chair;with call bell/phone within reach;with chair alarm set Nurse Communication: Mobility status PT Visit Diagnosis: History of falling (Z91.81);Unsteadiness on feet (R26.81);Other abnormalities of gait and mobility (R26.89);Muscle weakness (generalized) (M62.81);Repeated falls (R29.6)     Time: 8375-8354 PT Time Calculation (min) (ACUTE ONLY): 21 min  Charges:    $Gait Training: 8-22 mins PT General Charges $$ ACUTE PT VISIT: 1 Visit                     Jalecia Leon, PT  Acute Rehab Dept ([facility])  [phone]  05/07/2024    [facility] 05/07/2024, 5:02 PM

## 2024-05-07 NOTE — Progress Notes (Signed)
 Mobility Specialist Progress Note:   05/07/24 1223  Mobility  Activity  (Bed Exercises)  Level of Assistance Independent  Range of Motion/Exercises Active  Activity Response Tolerated well  Mobility Referral Yes  Mobility visit 1 Mobility  Mobility Specialist Start Time (ACUTE ONLY) 1200  Mobility Specialist Stop Time (ACUTE ONLY) 1211  Mobility Specialist Time Calculation (min) (ACUTE ONLY) 11 min   Pt was received in bed and agreed to bed mobility. Pt stated not feeling well for ambulation, opted for exercises: Seated BLE Exercises:  1) Toe Raise/ Heel Raise: 1 x 6  2) Knee Extension: 1 x 6  3) Marching: 1 x 6  4) Hip Adduction (pillow squeezes): 1 x 8   Pt had no complaints/issues during session. Returned to bed with all needs met. Call bell in reach and bed alarm on.  [employer] - Mobility Specialist

## 2024-05-07 NOTE — TOC Progression Note (Signed)
 Transition of Care [facility]) - Progression Note    Patient Details  Name: [name] [name] MRN: [id number] Date of Birth: Jul 30, 1965  Transition of Care [facility]) CM/SW Contact  Doneta Glenys DASEN, RN Phone Number: 05/07/2024, 9:26 AM  Clinical Narrative:    CM called Sandie Lady Ms. Deaton [phone] left message.     Barriers to Discharge: SNF Pending bed offer               Expected Discharge Plan and Services                                               Social Drivers of Health (SDOH) Interventions SDOH Screenings   Food Insecurity: Patient Unable To Answer (03/07/2024)  Housing: Unknown (03/07/2024)  Transportation Needs: Patient Unable To Answer (03/07/2024)  Utilities: Patient Unable To Answer (03/07/2024)  Depression (PHQ2-9): High Risk (01/26/2024)  Social Connections: Unknown (09/22/2021)   Received from [facility]  Tobacco Use: Low Risk (03/13/2024)    Readmission Risk Interventions     No data to display

## 2024-05-07 NOTE — Plan of Care (Signed)

## 2024-05-08 DIAGNOSIS — G9340 Encephalopathy, unspecified: Secondary | ICD-10-CM | POA: Diagnosis not present

## 2024-05-08 NOTE — Progress Notes (Signed)
 Mobility Specialist - Progress Note   05/08/24 1339  Mobility  Activity Ambulated with assistance  Level of Assistance Minimal assist, patient does 75% or more  Assistive Device Front wheel walker  Distance Ambulated (ft) 5 ft  Activity Response Tolerated well  Mobility visit 1 Mobility  Mobility Specialist Start Time (ACUTE ONLY) 1329  Mobility Specialist Stop Time (ACUTE ONLY) 1336  Mobility Specialist Time Calculation (min) (ACUTE ONLY) 7 min   Pt received in bathroom, requesting to return to recliner. Required Min A to help steady posterior lean, no complaints made. Pt was left in recliner with chair alarm on and call bell at side.   [name] [name] Mobility Specialist Acute Rehabilitation Services 05/08/2024, 1:41 PM

## 2024-05-08 NOTE — Plan of Care (Signed)
" °  Problem: Clinical Measurements: Goal: Diagnostic test results will improve Outcome: Progress  Problem: Health Behavior/Discharge Planning: Goal: Ability to manage health-related needs will improve Outcome: Progressing       Problem: Clinical Measurements: Goal: Diagnostic test results will improve Outcome: Progressing   "

## 2024-05-08 NOTE — Progress Notes (Signed)
 " PROGRESS NOTE    [name] [name]  FMW:[id number] DOB: 22-Mar-1966 DOA: 03/02/2024 PCP: [name], [name]  Chief Complaint  Patient presents with   Placement    Hospital Course:  58 year old M with PMH of type I bipolar disorder, tardive dyskinesia, PTSD, MDD, GAD, DM-2, tremor, frequent falls and hyperlipidemia was brought to ED by EMS on 10/18 due to talking crazy and altered mental status.    Per EDP note, roommate/friend reported problems with the patient having frequent falls in the house, difficulty caring for himself, erratic behavior including today where he had a very loud outburst in front of her, calling her names, and then was overheard stating he may kill himself.  She is concerned because he has access to 2 guns in the house.     Patient boarded in the emergency room since 10/18. 10/19, psychiatry consulted as patient was endorsing suicidal thoughts-IVC 10/22, psychiatry cleared for discharge. 10/24, patient was noted to be encephalopathic so admitted to the hospital.  11/4, MRI under sedation, transient hypotension and altered mental status-overnight in ICU. 11/5, clinically improved.  Sedation wore off.  Transferred back to MedSurg bed. 11/18.  No capacity.  Two-physician DNR after discussion with palliative medicine given patient's multiple medical comorbidities.  APS involved. 11/18 through 12/28: Extensive workup for metabolic encephalopathy thought to be multifactorial.  No acute findings.  State has denied guardianship.  TOC working on SNF   Subjective: No acute events overnight. On evaluation today patient is watching TV, and has no complaints  Objective: Vitals:   05/07/24 1212 05/07/24 2021 05/08/24 0439 05/08/24 1235  BP: 112/71 111/69 115/74 109/75  Pulse: 71 79 61 77  Resp: 18 16 16 18   Temp: 97.9 F (36.6 C) 98.2 F (36.8 C) (!) 97.4 F (36.3 C) 98.3 F (36.8 C)  TempSrc: Oral Oral Oral Oral  SpO2: 97% 94% 95% 93%  Weight:      Height:         Intake/Output Summary (Last 24 hours) at 05/08/2024 1620 Last data filed at 05/08/2024 0930 Gross per 24 hour  Intake 240 ml  Output --  Net 240 ml   Filed Weights   03/07/24 2229 03/26/24 1715  Weight: 87.4 kg 83 kg    Examination: General exam: Appears calm and comfortable, NAD  Respiratory system: No work of breathing, symmetric chest wall expansion Cardiovascular system: S1 & S2 heard, RRR.  Gastrointestinal system: Abdomen is nondistended, soft and nontender.  Neuro: Alert and oriented x3  Assessment & Plan:  Principal Problem:   Acute encephalopathy Active Problems:   Chronic low back pain without sciatica   Acute delirium   Generalized weakness   Bipolar 1 disorder (HCC)   Goals of care, counseling/discussion   Counseling and coordination of care   Palliative care encounter    Acute metabolic encephalopathy Underlying psychiatric disorder Bipolar 1 disorder PTSD Tardive dyskinesia PTSD MDD GAD - Acute metabolic encephalopathy thought to be multifactorial given underlying cognitive impairment complicated by dehydration, and acute delirium. - CTA and MRI brain negative for acute findings - EEG negative x 3 - TSH, B12, ammonia, thiamine  WNL - RPR: Nonreactive  - Delirium and fall precautions - Frequent reorientation - Psychiatry is seeing the patient during this hospitalization and has signed off - Continue with Depakote , duloxetine , hydroxyzine , propranolol , valbenazine  as currently prescribed - Will need close outpatient follow-up with psychiatry - As needed Haldol   Hypomagnesemia  - Will replace  Hypotension - Initially low BPs secondary  to sedation.  BP then resolved - TSH WNL in October - Propranolol  intermittently being held.  Hyperlipidemia - Continue statin  Type 2 diabetes - Hemoglobin A1c 6.3% continue metformin   Hypoxemia - Resolved  Medical decision making - Patient does not have capacity to make his own medical decisions.   APS is involved.  Guardianship has reportedly been denied  Goals of care - Previously two-physician signed DNR.  Palliative care was also involved in the care at that time and has followed the patient throughout this hospitalization.  Body mass index is 26.26 kg/m. Overweight - Outpatient follow up for lifestyle modification and risk factor management   DVT prophylaxis: Lovenox    Code Status: Limited: Do not attempt resuscitation (DNR) -DNR-LIMITED -Do Not Intubate/DNI  Disposition:  Inpt pending SNF  Consultants:  Treatment Team:  Consulting Physician: Fredia Dorothe HERO, MD Consulting Physician: Dasie Ellouise CROME, [name] Consulting Physician: Jeryl Skeens, MD Consulting Physician: Clayton Tinnie ORN, DO  Procedures:    Antimicrobials:  Anti-infectives (From admission, onward)    Start     Dose/Rate Route Frequency Ordered Stop   03/12/24 1515  amoxicillin -clavulanate (AUGMENTIN ) 875-125 MG per tablet 1 tablet        1 tablet Oral Every 12 hours 03/12/24 1420 03/17/24 0959   03/12/24 1515  doxycycline  (VIBRA -TABS) tablet 100 mg  Status:  Discontinued        100 mg Oral Every 12 hours 03/12/24 1420 03/15/24 1100       Data Reviewed: I have personally reviewed following labs and imaging studies CBC: Recent Labs  Lab 05/06/24 0503  WBC 7.8  HGB 14.7  HCT 43.6  MCV 86.5  PLT 164   Basic Metabolic Panel: Recent Labs  Lab 05/06/24 0503  NA 139  K 3.8  CL 101  CO2 29  GLUCOSE 105*  BUN 14  CREATININE 0.71  CALCIUM  9.1  MG 1.6*  PHOS 3.7   GFR: Estimated Creatinine Clearance: 102.7 mL/min (by C-G formula based on SCr of 0.71 mg/dL). Liver Function Tests: No results for input(s): AST, ALT, ALKPHOS, BILITOT, PROT, ALBUMIN  in the last 168 hours.  CBG: No results for input(s): GLUCAP in the last 168 hours.  No results found for this or any previous visit (from the past 240 hours).    Radiology Studies: No results found.  Scheduled Meds:  atorvastatin    20 mg Oral Daily   divalproex   625 mg Oral Q8H   DULoxetine   60 mg Oral BID   enoxaparin  (LOVENOX ) injection  40 mg Subcutaneous Q24H   hydrOXYzine   50 mg Oral QHS   metFORMIN   500 mg Oral BID WC   multivitamin with minerals  1 tablet Oral Daily   propranolol   5 mg Oral BID   valbenazine   80 mg Oral Daily   Continuous Infusions:   LOS: 62 days  MDM: Patient is high risk for one or more organ failure.  They necessitate ongoing hospitalization for continued IV therapies and subsequent lab monitoring. Total time spent interpreting labs and vitals, reviewing the medical record, coordinating care amongst consultants and care team members, directly assessing and discussing care with the patient and/or family: 55 min  Sagar Tengan, DO [facility]  To contact the attending physician between 7A-7P please use Epic Chat. To contact the covering physician during after hours 7P-7A, please review Amion.  05/08/2024, 4:20 PM   *This document has been created with the assistance of dictation software. Please excuse typographical errors. *   "

## 2024-05-08 NOTE — Plan of Care (Signed)

## 2024-05-08 NOTE — TOC Progression Note (Signed)
 Transition of Care [facility]) - Progression Note    Patient Details  Name: [name] [name] MRN: [id number] Date of Birth: 1965-06-17  Transition [facility] Care [facility]) CM/SW Contact  Doneta Glenys DASEN, RN Phone Number: 05/08/2024, 12:44 PM  Clinical Narrative:    CM called [facility] Shefi  case worker [phone] for update - left message.     Barriers to Discharge: SNF Pending bed offer               Expected Discharge Plan and Services                                               Social Drivers of Health (SDOH) Interventions SDOH Screenings   Food Insecurity: Patient Unable To Answer (03/07/2024)  Housing: Unknown (03/07/2024)  Transportation Needs: Patient Unable To Answer (03/07/2024)  Utilities: Patient Unable To Answer (03/07/2024)  Depression (PHQ2-9): High Risk (01/26/2024)  Social Connections: Unknown (09/22/2021)   Received from [facility]  Tobacco Use: Low Risk (03/13/2024)    Readmission Risk Interventions     No data to display

## 2024-05-09 DIAGNOSIS — Z66 Do not resuscitate: Secondary | ICD-10-CM | POA: Insufficient documentation

## 2024-05-09 DIAGNOSIS — L8990 Pressure ulcer of unspecified site, unspecified stage: Secondary | ICD-10-CM | POA: Diagnosis present

## 2024-05-09 DIAGNOSIS — G9340 Encephalopathy, unspecified: Secondary | ICD-10-CM | POA: Diagnosis not present

## 2024-05-09 NOTE — Assessment & Plan Note (Addendum)
 Hemoglobin A1c 6.3%, good control Continue metformin 

## 2024-05-09 NOTE — Assessment & Plan Note (Signed)
 2 physician signature DNR

## 2024-05-09 NOTE — Assessment & Plan Note (Addendum)
 Previously two-physician signed DNR Palliative care was also involved in the care at that time and has followed the patient throughout this hospitalization  Patient does not have capacity to make his own medical decisions APS is involved, but guardianship has been denied  At this time, he is awaiting LTC placement

## 2024-05-09 NOTE — Assessment & Plan Note (Addendum)
 Acute metabolic encephalopathy thought to be multifactorial given underlying cognitive impairment complicated by dehydration, and acute delirium CTA and MRI brain negative for acute findings EEG negative x 3 TSH, B12, ammonia, thiamine  WNL RPR: Nonreactive  Delirium and fall precautions Frequent reorientation Psychiatry is seeing the patient during this hospitalization and has signed off Continue with Depakote , duloxetine , hydroxyzine , propranolol , valbenazine  as currently prescribed Will need close outpatient follow-up with psychiatry As needed Haldol 

## 2024-05-09 NOTE — Plan of Care (Signed)
" °  Problem: Clinical Measurements: Goal: Cardiovascular complication will be avoided Outcome: Progressing   Problem: Health Behavior/Discharge Planning: Goal: Ability to manage health-related needs will improve Outcome: Progressing   Problem: Clinical Measurements: Goal: Will remain free from infection Outcome: Progressing   "

## 2024-05-09 NOTE — Assessment & Plan Note (Addendum)
 Wound 05/05/24 2200 Pressure Injury Coccyx Mid;Left;Right Stage 1 -  Intact skin with non-blanchable redness of a localized area usually over a bony prominence. (Active)  Not POA

## 2024-05-09 NOTE — Progress Notes (Signed)
 " Progress Note   Patient: [name] [name] FMW:[id number] DOB: 1965-07-28 DOA: 03/02/2024     63 DOS: the patient was seen and examined on 05/09/2024   Brief hospital course: 58yo with h/o type 1 bipolar d/o, tardive dyskinesia, T2DM, frequent falls, and HLD who presented on 10/18 with AMS.  He was initially boarded in the ER and was eventually admitted for evaluation of encephalopathy on 10/24.  He lacks capacity but the state has denied guardianship.  He is awaiting SNF placement.      Assessment & Plan Acute encephalopathy Acute delirium Bipolar 1 disorder (HCC) Tardive dyskinesia Acute metabolic encephalopathy thought to be multifactorial given underlying cognitive impairment complicated by dehydration, and acute delirium CTA and MRI brain negative for acute findings EEG negative x 3 TSH, B12, ammonia, thiamine  WNL RPR: Nonreactive  Delirium and fall precautions Frequent reorientation Psychiatry is seeing the patient during this hospitalization and has signed off Continue with Depakote , duloxetine , hydroxyzine , propranolol , valbenazine  as currently prescribed Will need close outpatient follow-up with psychiatry As needed Haldol  Goals of care, counseling/discussion Previously two-physician signed DNR Palliative care was also involved in the care at that time and has followed the patient throughout this hospitalization  Patient does not have capacity to make his own medical decisions APS is involved, but guardianship has been denied  At this time, he is awaiting LTC placement Pressure injury of skin Wound 05/05/24 2200 Pressure Injury Coccyx Mid;Left;Right Stage 1 -  Intact skin with non-blanchable redness of a localized area usually over a bony prominence. (Active)  Not POA Type 2 diabetes mellitus with hyperglycemia, without long-term current use of insulin  (HCC) Hemoglobin A1c 6.3%, good control Continue metformin   Mixed hyperlipidemia Continue statin  Essential  hypertension Initially low BPs secondary to sedation BP normalized, currently 126/85 TSH WNL in October Propranolol  intermittently being held DNR (do not resuscitate) 2 physician signature DNR      Consultants: Psychiatry Neurology Pulmonology Palliative Care Dietician PT OT ICM team  Procedures: EEG 10/30  Antibiotics: Augmentin  11/3-8  30 Day Unplanned Readmission Risk Score    Flowsheet Row ED to Hosp-Admission (Current) from [facility]02/2024 [facility]  30 Day Unplanned Readmission Risk Score (%) 26.63 Filed at 05/09/2024 0801    This score is the patient's risk of an unplanned readmission within 30 days of being discharged (0 -100%). The score is based on dignosis, age, lab data, medications, orders, and past utilization.   Low:  0-14.9   Medium: 15-21.9   High: 22-29.9   Extreme: 30 and above           Subjective: Understands that he needs placement, I can't walk.     Objective: Vitals:   05/08/24 1911 05/09/24 0450  BP: 117/74 103/76  Pulse: 75 (!) 59  Resp: 16 16  Temp: 98 F (36.7 C) 97.7 F (36.5 C)  SpO2: 95% 94%    Intake/Output Summary (Last 24 hours) at 05/09/2024 0924 Last data filed at 05/08/2024 0930 Gross per 24 hour  Intake 240 ml  Output --  Net 240 ml   Filed Weights   03/07/24 2229 03/26/24 1715  Weight: 87.4 kg 83 kg    Exam:  General:  Appears calm and comfortable and is in NAD Eyes:  normal lids, iris ENT:  grossly normal hearing, lips & tongue, mmm Cardiovascular:  RRR. No LE edema.  Respiratory:   CTA bilaterally with no wheezes/rales/rhonchi.  Normal respiratory effort. Abdomen:  soft, NT, ND  Skin:  no rash or induration seen on limited exam Musculoskeletal:  grossly normal tone BUE/BLE, good ROM, no bony abnormality Psychiatric:  blunted mood and affect, speech fluent and appropriate, AOx3 Neurologic:  CN 2-12 grossly intact, moves all extremities in coordinated  fashion  Data Reviewed: I have reviewed the patient's lab results since admission.  Pertinent labs for today include:   None today     Family Communication: None present  Mobility: PT/OT Consulted and are recommending - Recommend Snf, Barriers To Snf Placement - Toc To F/U With Patient/Family For D/C Plans12/29/2025 1600    Code Status: Limited: Do not attempt resuscitation (DNR) -DNR-LIMITED -Do Not Intubate/DNI     Disposition: Status is: Inpatient Remains inpatient appropriate because: awaiting placement     Time spent: 35 minutes  Unresulted Labs (From admission, onward)    None        Author: Delon Herald, MD 05/09/2024 9:24 AM  For on call review [email or web address].            "

## 2024-05-09 NOTE — Assessment & Plan Note (Addendum)
 Initially low BPs secondary to sedation BP normalized, currently 126/85 TSH WNL in October Propranolol  intermittently being held

## 2024-05-09 NOTE — Assessment & Plan Note (Addendum)
"   Continue statin   "

## 2024-05-09 NOTE — TOC Progression Note (Signed)
 Transition of Care [facility]) - Progression Note    Patient Details  Name: [name] [name] MRN: [id number] Date of Birth: Feb 06, 1966  [facility] CM/SW Contact  Doneta Glenys DASEN, RN Phone Number: 05/09/2024, 5:05 PM  Clinical Narrative:    CM spoke with APS [facility] stating that patient does not have a payor source for facility. Shefi requested that [facility] assist with applying for Medicaid and Disability. CM sent email to Financial counselor to assistance.  Barriers to Discharge: SNF Pending bed offer               Expected Discharge Plan and Services                                               Social Drivers of Health (SDOH) Interventions SDOH Screenings   Food Insecurity: Patient Unable To Answer (03/07/2024)  Housing: Unknown (03/07/2024)  Transportation Needs: Patient Unable To Answer (03/07/2024)  Utilities: Patient Unable To Answer (03/07/2024)  Depression (PHQ2-9): High Risk (01/26/2024)  Social Connections: Unknown (09/22/2021)   Received from [facility]  Tobacco Use: Low Risk (03/13/2024)    Readmission Risk Interventions     No data to display

## 2024-05-09 NOTE — Progress Notes (Addendum)
 PT Cancellation Note  Patient Details Name: [name] [name] MRN: [id number] DOB: 03/14/1966   Cancelled Treatment:    Reason Eval/Treat Not Completed: Pain limiting ability to participate. Pt with 9/10 pain in low back and bottom, declined participation at current time. RN provided pain meds prior to session however pt still in pain. PT will attempt treatment as time allows.   Returned in PM, pt continued to report high levels of pain and declined therapy. Stated I will walk with y'all tomorrow.  Isaiah DEL. Baptiste Littler, PT, DPT   [employer] 05/09/2024, 12:37 PM

## 2024-05-10 DIAGNOSIS — R638 Other symptoms and signs concerning food and fluid intake: Secondary | ICD-10-CM | POA: Diagnosis not present

## 2024-05-10 DIAGNOSIS — G9340 Encephalopathy, unspecified: Secondary | ICD-10-CM | POA: Diagnosis not present

## 2024-05-10 NOTE — Assessment & Plan Note (Signed)
 Hemoglobin A1c 6.3%, good control Continue metformin 

## 2024-05-10 NOTE — Progress Notes (Signed)
 Physical Therapy Treatment Patient Details Name: [name] [name] MRN: [id number] DOB: [birth year] Today's Date: 05/10/2024   History of Present Illness Pt is a 59 y/o male presenting on 10/24 for fall and chronic back pain. 10/19 psychiatry consulted due to suicidal thoughts, cleared on 10/22. Change in mental status due to acute delirium, and admitted from ED with acute metabolic encephalopathy. CT negative, EEG negative, MRI pending, chest x ray with hypoventilation. PMH includes: bipolar disorder type 1, tardive dyskinesia, PTSD, MDD, CAD, DM2, OA, tremors.    PT Comments  Pt very cooperative this am and continues to require CGA/min assist and cues for safety for completion of all basic tasks.  Pt up to bathroom for toileting with hand hygiene standing at sink and up to hallway for ambulation.  Pt up in chair for lunch, alarm in place.    If plan is discharge home, recommend the following: Assistance with cooking/housework;Assist for transportation;Help with stairs or ramp for entrance;Direct supervision/assist for medications management;A little help with walking and/or transfers;A little help with bathing/dressing/bathroom;Direct supervision/assist for financial management   Can travel by private vehicle        Equipment Recommendations  None recommended by PT    Recommendations for Other Services OT consult     Precautions / Restrictions Precautions Precautions: Fall Recall of Precautions/Restrictions: Impaired Precaution/Restrictions Comments: Parkinson's Restrictions Weight Bearing Restrictions Per Provider Order: No Other Position/Activity Restrictions: chronic back pain, history of Parkinson's disease     Mobility  Bed Mobility Overal bed mobility: Needs Assistance Bed Mobility: Supine to Sit     Supine to sit: Contact guard, Used rails     General bed mobility comments: pt able to initate and complete supine to sit with use of bed rail, CGA to facilitate continued  forward wt translation    Transfers Overall transfer level: Needs assistance Equipment used: Rolling walker (2 wheels) Transfers: Sit to/from Stand Sit to Stand: Min assist           General transfer comment: cues for use of UEs to self assist; min assist to bring wt up and fwd and to balance in initial standing.    Ambulation/Gait Ambulation/Gait assistance: Contact guard assist, Min assist Gait Distance (Feet): 240 Feet (and 15' to bathroom) Assistive device: Rolling walker (2 wheels) Gait Pattern/deviations: Decreased step length - right, Decreased step length - left, Shuffle, Trunk flexed Gait velocity: decreased     General Gait Details: step length variable but improved with incr distance; cues for proper RW position and trunk extension. CGA needed for safety   Stairs             Wheelchair Mobility     Tilt Bed    Modified Rankin (Stroke Patients Only)       Balance Overall balance assessment: Needs assistance Sitting-balance support: Feet supported, No upper extremity supported Sitting balance-Leahy Scale: Fair   Postural control: Posterior lean Standing balance support: Bilateral upper extremity supported, During functional activity, Reliant on assistive device for balance Standing balance-Leahy Scale: Poor Standing balance comment: reliant on UEs and CGA for safety                            Communication Communication Communication: No apparent difficulties  Cognition Arousal: Alert Behavior During Therapy: WFL for tasks assessed/performed, Flat affect   PT - Cognitive impairments: Safety/Judgement  PT - Cognition Comments: Questionable safety awareness and insight into limitations Following commands: Impaired Following commands impaired: Only follows one step commands consistently    Cueing Cueing Techniques: Verbal cues, Tactile cues  Exercises      General Comments        Pertinent  Vitals/Pain Pain Assessment Pain Assessment: No/denies pain    Home Living                          Prior Function            PT Goals (current goals can now be found in the care plan section) Acute Rehab PT Goals Patient Stated Goal: Decreased back pain; no more falls PT Goal Formulation: With patient Time For Goal Achievement: 05/23/24 Progress towards PT goals: Progressing toward goals    Frequency    Min 2X/week      PT Plan      Co-evaluation              AM-PAC PT 6 Clicks Mobility   Outcome Measure  Help needed turning from your back to your side while in a flat bed without using bedrails?: A Little Help needed moving from lying on your back to sitting on the side of a flat bed without using bedrails?: A Little Help needed moving to and from a bed to a chair (including a wheelchair)?: A Little Help needed standing up from a chair using your arms (e.g., wheelchair or bedside chair)?: A Little Help needed to walk in hospital room?: A Little Help needed climbing 3-5 steps with a railing? : A Little 6 Click Score: 18    End of Session Equipment Utilized During Treatment: Gait belt Activity Tolerance: Patient tolerated treatment well Patient left: in chair;with call bell/phone within reach;with chair alarm set Nurse Communication: Mobility status PT Visit Diagnosis: History of falling (Z91.81);Unsteadiness on feet (R26.81);Other abnormalities of gait and mobility (R26.89);Muscle weakness (generalized) (M62.81);Repeated falls (R29.6)     Time: 1202-1225 PT Time Calculation (min) (ACUTE ONLY): 23 min  Charges:    $Gait Training: 8-22 mins $Therapeutic Activity: 8-22 mins PT General Charges $$ ACUTE PT VISIT: 1 Visit                     [facility] PT Acute Rehabilitation Services Office [phone]    [name] [name] 05/10/2024, 12:36 PM

## 2024-05-10 NOTE — Assessment & Plan Note (Signed)
 Acute metabolic encephalopathy thought to be multifactorial given underlying cognitive impairment complicated by dehydration, and acute delirium CTA and MRI brain negative for acute findings EEG negative x 3 TSH, B12, ammonia, thiamine  WNL RPR: Nonreactive  Delirium and fall precautions Frequent reorientation Psychiatry is seeing the patient during this hospitalization and has signed off Continue with Depakote , duloxetine , hydroxyzine , propranolol , valbenazine  as currently prescribed Will need close outpatient follow-up with psychiatry As needed Haldol 

## 2024-05-10 NOTE — Assessment & Plan Note (Signed)
"   Continue statin   "

## 2024-05-10 NOTE — Assessment & Plan Note (Addendum)
 Nutrition Problem: Increased nutrient needs Etiology: acute illness Signs/Symptoms: estimated needs Interventions: Ensure Enlive (each supplement provides 350kcal and 20 grams of protein), MVI

## 2024-05-10 NOTE — Plan of Care (Signed)

## 2024-05-10 NOTE — Assessment & Plan Note (Addendum)
 Has two-physician signed DNR Palliative care was also involved in the care at that time and has followed the patient throughout this hospitalization  Patient does not have capacity to make his own medical decisions APS is involved, but guardianship has been denied  At this time, he is awaiting LTC placement He reports that he has Medicaid

## 2024-05-10 NOTE — Assessment & Plan Note (Signed)
 Wound 05/05/24 2200 Pressure Injury Coccyx Mid;Left;Right Stage 1 -  Intact skin with non-blanchable redness of a localized area usually over a bony prominence. (Active)  Not POA

## 2024-05-10 NOTE — Progress Notes (Signed)
 " Progress Note   Patient: [name] [name] FMW:[id number] DOB: Jul 28, 1965 DOA: 03/02/2024     63 DOS: the patient was seen and examined on 05/10/2024   Brief hospital course: 58yo with h/o type 1 bipolar d/o, tardive dyskinesia, T2DM, frequent falls, and HLD who presented on 10/18 with AMS.  He was initially boarded in the ER and was eventually admitted for evaluation of encephalopathy on 10/24.  He lacks capacity but the state has denied guardianship.  He is awaiting SNF placement.      Assessment & Plan Acute encephalopathy Acute delirium Bipolar 1 disorder (HCC) Tardive dyskinesia Acute metabolic encephalopathy thought to be multifactorial given underlying cognitive impairment complicated by dehydration, and acute delirium CTA and MRI brain negative for acute findings EEG negative x 3 TSH, B12, ammonia, thiamine  WNL RPR: Nonreactive  Delirium and fall precautions Frequent reorientation Psychiatry is seeing the patient during this hospitalization and has signed off Continue with Depakote , duloxetine , hydroxyzine , propranolol , valbenazine  as currently prescribed Will need close outpatient follow-up with psychiatry As needed Haldol  Pressure injury of skin Wound 05/05/24 2200 Pressure Injury Coccyx Mid;Left;Right Stage 1 -  Intact skin with non-blanchable redness of a localized area usually over a bony prominence. (Active)  Not POA Type 2 diabetes mellitus with hyperglycemia, without long-term current use of insulin  (HCC) Hemoglobin A1c 6.3%, good control Continue metformin   Mixed hyperlipidemia Continue statin  Essential hypertension Initially low BPs secondary to sedation BP normalized, currently 126/85 TSH WNL in October Propranolol  intermittently being held Increased nutritional needs Nutrition Problem: Increased nutrient needs Etiology: acute illness Signs/Symptoms: estimated needs Interventions: Ensure Enlive (each supplement provides 350kcal and 20 grams of protein),  MVI DNR (do not resuscitate) Goals of care, counseling/discussion Has two-physician signed DNR Palliative care was also involved in the care at that time and has followed the patient throughout this hospitalization  Patient does not have capacity to make his own medical decisions APS is involved, but guardianship has been denied  At this time, he is awaiting LTC placement He reports that he has Medicaid      Consultants: Psychiatry Neurology Pulmonology Palliative Care Dietician PT OT ICM team   Procedures: EEG 10/30   Antibiotics: Augmentin  11/3-8  30 Day Unplanned Readmission Risk Score    Flowsheet Row ED to Hosp-Admission (Current) from [facility]02/2024 in [facility]  30 Day Unplanned Readmission Risk Score (%) 26.63 Filed at 05/10/2024 0801    This score is the patient's risk of an unplanned readmission within 30 days of being discharged (0 -100%). The score is based on dignosis, age, lab data, medications, orders, and past utilization.   Low:  0-14.9   Medium: 15-21.9   High: 22-29.9   Extreme: 30 and above           Subjective: Tremor, chronic.  No new concerns.   Objective: Vitals:   05/09/24 1920 05/10/24 0428  BP: 122/87 97/66  Pulse: 68 61  Resp: 17 16  Temp: 97.7 F (36.5 C) 97.9 F (36.6 C)  SpO2: 97% 95%    Intake/Output Summary (Last 24 hours) at 05/10/2024 0813 Last data filed at 05/09/2024 0830 Gross per 24 hour  Intake 240 ml  Output --  Net 240 ml   Filed Weights   03/07/24 2229 03/26/24 1715  Weight: 87.4 kg 83 kg    Exam:  General:  Appears chronically ill, tremulous Eyes:  normal lids, iris; glasses in place ENT:  grossly normal hearing, lips & tongue,  mmm Cardiovascular:  RRR. No LE edema.  Respiratory:   CTA bilaterally with no wheezes/rales/rhonchi.  Normal respiratory effort. Abdomen:  soft, NT, ND Skin:  no rash or induration seen on limited exam Musculoskeletal:  grossly  normal tone BUE/BLE, good ROM, no bony abnormality Psychiatric:  blunted mood and affect, speech fluent and appropriate, AOx3 Neurologic:  CN 2-12 grossly intact, tremulous  Data Reviewed: I have reviewed the patient's lab results since admission.  Pertinent labs for today include:   None today     Family Communication: None present  Mobility: PT/OT Consulted and are recommending - Recommend Snf, Barriers To Snf Placement - Toc To F/U With Patient/Family For D/C Plans12/29/2025 1600    Code Status: Limited: Do not attempt resuscitation (DNR) -DNR-LIMITED -Do Not Intubate/DNI     Disposition: Status is: Inpatient Remains inpatient appropriate because: needs placement     Time spent: 35 minutes  Unresulted Labs (From admission, onward)    None        Author: Delon Herald, MD 05/10/2024 8:13 AM  For on call review [email or web address].            "

## 2024-05-10 NOTE — Assessment & Plan Note (Signed)
 Initially low BPs secondary to sedation BP normalized, currently 126/85 TSH WNL in October Propranolol  intermittently being held

## 2024-05-11 DIAGNOSIS — G9340 Encephalopathy, unspecified: Secondary | ICD-10-CM | POA: Diagnosis not present

## 2024-05-11 NOTE — Assessment & Plan Note (Addendum)
 Acute metabolic encephalopathy thought to be multifactorial given underlying cognitive impairment complicated by dehydration, and acute delirium CTA and MRI brain negative for acute findings EEG negative x 3 TSH, B12, ammonia, thiamine  WNL RPR: Nonreactive  Delirium and fall precautions Frequent reorientation Psychiatry is seeing the patient during this hospitalization and has signed off Continue with Depakote , duloxetine , hydroxyzine , propranolol , valbenazine  as currently prescribed Will need close outpatient follow-up with psychiatry As needed Haldol 

## 2024-05-11 NOTE — Plan of Care (Signed)
" °  Daily Progress Note   Patient Name: [name] [name]       Date: 05/11/2024 DOB: 05/09/1966  Age: 59 y.o. MRN#: [id number] Attending Physician: Barbarann Nest, MD Primary Care Physician: [name], [name] Admit Date: 03/02/2024 Length of Stay: 65 days  Reviewed EMR.  Palliative medicine team has been following along peripherally with patient's medical journey.  At this time [name] working with APS regarding placement and applying for Medicaid and disability.  Patient continues to participate with PT/OT.  Continuing appropriate medical interventions at this time. Palliative medicine team will sign off at this time as no acute PMT needs required.  Please reconsult should acute PMT needs develop.  Thank you.   Tinnie Radar, DO Palliative Care Provider PMT # [phone]  No Charge Note  "

## 2024-05-11 NOTE — TOC Progression Note (Signed)
 Transition of Care [facility]) - Progression Note    Patient Details  Name: [name] [name] MRN: [id number] Date of Birth: Oct 04, 1965  [facility]) CM/SW Contact  Doneta Glenys DASEN, RN Phone Number: 05/11/2024, 12:01 PM  Clinical Narrative:    CM sent email to request assistance with applying for Traditional Medicaid and Social Security Disability.      Barriers to Discharge: SNF Pending bed offer               Expected Discharge Plan and Services                                               Social Drivers of Health (SDOH) Interventions SDOH Screenings   Food Insecurity: Patient Unable To Answer (03/07/2024)  Housing: Unknown (03/07/2024)  Transportation Needs: Patient Unable To Answer (03/07/2024)  Utilities: Patient Unable To Answer (03/07/2024)  Depression (PHQ2-9): High Risk (01/26/2024)  Social Connections: Unknown (09/22/2021)   Received from [facility]  Tobacco Use: Low Risk (03/13/2024)    Readmission Risk Interventions     No data to display

## 2024-05-11 NOTE — Assessment & Plan Note (Signed)
 This is bothersome to the patient He has been started on propranolol  and appears to be tolerating it well Consider increasing dose of propranolol  to see if this will help improve the tremors

## 2024-05-11 NOTE — Assessment & Plan Note (Addendum)
 Hemoglobin A1c 6.3%, good control Continue metformin 

## 2024-05-11 NOTE — Assessment & Plan Note (Addendum)
"   Continue statin   "

## 2024-05-11 NOTE — Progress Notes (Signed)
 " Progress Note   Patient: [name] [name] FMW:[id number] DOB: Aug 18, 1965 DOA: 03/02/2024     59 DOS: the patient was seen and examined on 05/11/2024   Brief hospital course: 59yo with h/o type 1 bipolar d/o, tardive dyskinesia, T2DM, frequent falls, and HLD who presented on 10/18 with AMS.  He was initially boarded in the ER and was eventually admitted for evaluation of encephalopathy on 10/24.  He lacks capacity but the state has denied guardianship.  He is awaiting SNF placement.      Assessment & Plan Acute encephalopathy Acute delirium Bipolar 1 disorder (HCC) Tardive dyskinesia Acute metabolic encephalopathy thought to be multifactorial given underlying cognitive impairment complicated by dehydration, and acute delirium CTA and MRI brain negative for acute findings EEG negative x 3 TSH, B12, ammonia, thiamine  WNL RPR: Nonreactive  Delirium and fall precautions Frequent reorientation Psychiatry is seeing the patient during this hospitalization and has signed off Continue with Depakote , duloxetine , hydroxyzine , propranolol , valbenazine  as currently prescribed Will need close outpatient follow-up with psychiatry As needed Haldol  Tremor This is bothersome to the patient He has been started on propranolol  and appears to be tolerating it well Consider increasing dose of propranolol  to see if this will help improve the tremors Pressure injury of skin Wound 05/05/24 2200 Pressure Injury Coccyx Mid;Left;Right Stage 1 -  Intact skin with non-blanchable redness of a localized area usually over a bony prominence. (Active)  Not POA Type 2 diabetes mellitus with hyperglycemia, without long-term current use of insulin  (HCC) Hemoglobin A1c 6.3%, good control Continue metformin   Mixed hyperlipidemia Continue statin  Essential hypertension Initially low BPs secondary to sedation BP normalized, currently 126/85 TSH WNL in October Propranolol  intermittently being held Increased nutritional  needs Nutrition Problem: Increased nutrient needs Etiology: acute illness Signs/Symptoms: estimated needs Interventions: Ensure Enlive (each supplement provides 350kcal and 20 grams of protein), MVI DNR (do not resuscitate) Goals of care, counseling/discussion Has two-physician signed DNR Palliative care was also involved in the care at that time and has followed the patient throughout this hospitalization  Patient does not have capacity to make his own medical decisions APS is involved, but guardianship has been denied  At this time, he is awaiting LTC placement He reports that he has Medicaid but this appears to be for psychiatric coverage only Once his payor source is clarified, he can be transferred for LTC      Consultants: Psychiatry Neurology Pulmonology Palliative Care Dietician PT OT ICM team   Procedures: EEG 10/30   Antibiotics: Augmentin  11/3-8  30 Day Unplanned Readmission Risk Score    Flowsheet Row ED to Hosp-Admission (Current) from 03/02/2024 in [facility]  30 Day Unplanned Readmission Risk Score (%) 26.63 Filed at 05/11/2024 0400    This score is the patient's risk of an unplanned readmission within 30 days of being discharged (0 -100%). The score is based on dignosis, age, lab data, medications, orders, and past utilization.   Low:  0-14.9   Medium: 15-21.9   High: 22-29.9   Extreme: 30 and above           Subjective: Feeling ok.  Concerned about tremor that is making it difficult for him to feed himself.   Objective: Vitals:   05/10/24 2216 05/11/24 0455  BP: 122/72 102/68  Pulse: 72 (!) 58  Resp:  18  Temp:  (!) 97.5 F (36.4 C)  SpO2:  93%    Intake/Output Summary (Last 24 hours) at 05/11/2024 0756  Last data filed at 05/10/2024 0958 Gross per 24 hour  Intake 831 ml  Output --  Net 831 ml   Filed Weights   03/07/24 2229 03/26/24 1715  Weight: 87.4 kg 83 kg    Exam:  General:   Appears calm and comfortable and is in NAD Eyes:  normal lids, iris ENT:  grossly normal hearing, lips & tongue, mmm Cardiovascular:  RRR. No LE edema.  Respiratory:   CTA bilaterally with no wheezes/rales/rhonchi.  Normal respiratory effort. Abdomen:  soft, NT, ND Skin:  no rash or induration seen on limited exam Musculoskeletal:  grossly normal tone BUE/BLE, good ROM, no bony abnormality Psychiatric:  grossly normal mood and affect, speech fluent and appropriate, AOx3 Neurologic:  CN 2-12 grossly intact, moves all extremities in coordinated fashion, +BUE tremor  Data Reviewed: I have reviewed the patient's lab results since admission.  Pertinent labs for today include:   None today     Family Communication: None present  Mobility: PT/OT Consulted and are recommending - Recommend Snf, Barriers To Snf Placement - Toc To F/U With Patient/Family For D/C Plans1/05/2024 1200    Code Status: Limited: Do not attempt resuscitation (DNR) -DNR-LIMITED -Do Not Intubate/DNI     Disposition: Status is: Inpatient Remains inpatient appropriate because: awaiting placement     Time spent: 35 minutes  Unresulted Labs (From admission, onward)    None        Author: Delon Herald, MD 05/11/2024 7:56 AM  For on call review [email or web address]            "

## 2024-05-11 NOTE — Assessment & Plan Note (Addendum)
 Initially low BPs secondary to sedation BP normalized, currently 126/85 TSH WNL in October Propranolol  intermittently being held

## 2024-05-11 NOTE — Assessment & Plan Note (Addendum)
 Nutrition Problem: Increased nutrient needs Etiology: acute illness Signs/Symptoms: estimated needs Interventions: Ensure Enlive (each supplement provides 350kcal and 20 grams of protein), MVI

## 2024-05-11 NOTE — Assessment & Plan Note (Addendum)
 Wound 05/05/24 2200 Pressure Injury Coccyx Mid;Left;Right Stage 1 -  Intact skin with non-blanchable redness of a localized area usually over a bony prominence. (Active)  Not POA

## 2024-05-11 NOTE — Progress Notes (Addendum)
 Occupational Therapy Treatment Patient Details Name: [name] [name] MRN: [id number] DOB: February 10, 1966 Today's Date: 05/11/2024   History of present illness Pt is a 59 y/o male presenting on 10/24 for fall and chronic back pain. 10/19 psychiatry consulted due to suicidal thoughts, cleared on 10/22. Change in mental status due to acute delirium, and admitted from ED with acute metabolic encephalopathy. CT negative, EEG negative, MRI pending, chest x ray with hypoventilation. PMH includes: bipolar disorder type 1, tardive dyskinesia, PTSD, MDD, CAD, DM2, OA, tremors.   OT comments  Session focused on sitting balance while engaging in LB ADL task. Pt with severe posterior bias requiring increased physical assist to maintain. VC provided for weight shifting in order to self correct although pt unable to maintain when challenged with task such as donning pants seated at EOB. Patient will benefit from continued inpatient follow up therapy, <3 hours/day.       If plan is discharge home, recommend the following:  Assistance with cooking/housework;Direct supervision/assist for medications management;Direct supervision/assist for financial management;Assist for transportation;Help with stairs or ramp for entrance;A lot of help with walking and/or transfers;A lot of help with bathing/dressing/bathroom   Equipment Recommendations  Other (comment) (defer to next level of care)       Precautions / Restrictions Precautions Precautions: Fall Recall of Precautions/Restrictions: Impaired Precaution/Restrictions Comments: Parkinson's Restrictions Weight Bearing Restrictions Per Provider Order: No       Mobility Bed Mobility Overal bed mobility: Needs Assistance Bed Mobility: Supine to Sit, Sit to Supine     Supine to sit: Min assist, Used rails, HOB elevated Sit to supine: Min assist   General bed mobility comments: Pt required increased assistance for bed mobility d/t severe posterior bias and  inability to self correct without physical assist.    Transfers Overall transfer level: Needs assistance Equipment used: Rolling walker (2 wheels) Transfers: Sit to/from Stand Sit to Stand: Mod assist           General transfer comment: unable to push from mattress to initiate sit to stand and preferred to place both hands on RW handles. Demonstrated posterior bias requiring manual faciliation to weight shift forward.     Balance Overall balance assessment: Needs assistance Sitting-balance support: Feet supported, Bilateral upper extremity supported, Single extremity supported Sitting balance-Leahy Scale: Zero Sitting balance - Comments: posterior bias severe this session requiring max assist to correct. Provided VC for technique although pt repeatedly stated that he couldn't help it instead of attempting correction first. Postural control: Posterior lean Standing balance support: Bilateral upper extremity supported, During functional activity, Reliant on assistive device for balance Standing balance-Leahy Scale: Zero Standing balance comment: Require physical assist to maintain standing posterior. Heavy posterior bias. VC to weight shift into toes versus heels although unable to carry over.          ADL either performed or assessed with clinical judgement   ADL        Lower Body Dressing: Maximal assistance;Sit to/from stand;Sitting/lateral leans Lower Body Dressing Details (indicate cue type and reason): sitting EOB to thread legs through pants while standing with RW to pull pants up. Difficulty with severe posterior lean                 Communication Communication Communication: No apparent difficulties   Cognition Arousal: Alert Behavior During Therapy: Flat affect Cognition: Cognition impaired   Orientation impairments: Situation Awareness: Online awareness impaired, Intellectual awareness impaired Memory impairment (select all impairments): Short-term memory,  Working memory Attention impairment (  select first level of impairment): Selective attention Executive functioning impairment (select all impairments): Problem solving, Reasoning    Following commands: Impaired Following commands impaired: Follows one step commands inconsistently, Follows one step commands with increased time      Cueing   Cueing Techniques: Verbal cues  Exercises Other Exercises Other Exercises: seated weight shifts forward/back and right /left with Mod Assist, 5-10X            Pertinent Vitals/ Pain       Pain Assessment Pain Assessment: No/denies pain Pain Score: 0-No pain         Frequency  Min 2X/week        Progress Toward Goals  OT Goals(current goals can now be found in the care plan section)  Progress towards OT goals: Progressing toward goals  Acute Rehab OT Goals OT Goal Formulation: With patient Time For Goal Achievement: 05/25/24 Potential to Achieve Goals: Fair ADL Goals Pt Will Perform Grooming: with contact guard assist;standing Pt Will Perform Lower Body Dressing: sit to/from stand;with contact guard assist Pt Will Transfer to Toilet: with contact guard assist;bedside commode Pt Will Perform Toileting - Clothing Manipulation and hygiene: with contact guard assist;sitting/lateral leans Additional ADL Goal #1: Pt will follow 3 step command with increased time during ADL routine.  Plan         AM-PAC OT 6 Clicks Daily Activity     Outcome Measure   Help from another person eating meals?: None Help from another person taking care of personal grooming?: A Little Help from another person toileting, which includes using toliet, bedpan, or urinal?: A Lot Help from another person bathing (including washing, rinsing, drying)?: A Lot Help from another person to put on and taking off regular upper body clothing?: A Lot Help from another person to put on and taking off regular lower body clothing?: A Lot 6 Click Score: 15    End of  Session Equipment Utilized During Treatment: Gait belt;Rolling walker (2 wheels)  OT Visit Diagnosis: Other abnormalities of gait and mobility (R26.89);Unsteadiness on feet (R26.81);Muscle weakness (generalized) (M62.81);History of falling (Z91.81);Other symptoms and signs involving cognitive function   Activity Tolerance Patient tolerated treatment well   Patient Left in bed;with call bell/phone within reach;with bed alarm set           Time: 1450-1514 OT Time Calculation (min): 24 min  Charges: OT General Charges $OT Visit: 1 Visit OT Treatments $Self Care/Home Management : 8-22 mins $Therapeutic Activity: 8-22 mins  Leita Howell, OTR/L,CBIS  Supplemental OT - [facility] and [facility] Secure Chat Preferred    Dayshaun Whobrey, Leita BIRCH 05/11/2024, 4:25 PM

## 2024-05-11 NOTE — Assessment & Plan Note (Addendum)
 Has two-physician signed DNR Palliative care was also involved in the care at that time and has followed the patient throughout this hospitalization  Patient does not have capacity to make his own medical decisions APS is involved, but guardianship has been denied  At this time, he is awaiting LTC placement He reports that he has Medicaid but this appears to be for psychiatric coverage only Once his payor source is clarified, he can be transferred for LTC

## 2024-05-11 NOTE — Plan of Care (Signed)
   Problem: Health Behavior/Discharge Planning: Goal: Ability to manage health-related needs will improve Outcome: Progressing   Problem: Clinical Measurements: Goal: Ability to maintain clinical measurements within normal limits will improve Outcome: Progressing Goal: Will remain free from infection Outcome: Progressing

## 2024-05-12 DIAGNOSIS — G9340 Encephalopathy, unspecified: Secondary | ICD-10-CM | POA: Diagnosis not present

## 2024-05-12 NOTE — Assessment & Plan Note (Addendum)
 This is bothersome to the patient He has been started on propranolol  and appears to be tolerating it well Consider increasing dose of propranolol  to see if this will help improve the tremors

## 2024-05-12 NOTE — Assessment & Plan Note (Addendum)
 Acute metabolic encephalopathy thought to be multifactorial given underlying cognitive impairment complicated by dehydration, and acute delirium CTA and MRI brain negative for acute findings EEG negative x 3 TSH, B12, ammonia, thiamine  WNL RPR: Nonreactive  Delirium and fall precautions Frequent reorientation Psychiatry is seeing the patient during this hospitalization and has signed off Continue with Depakote , duloxetine , hydroxyzine , propranolol , valbenazine  as currently prescribed Will need close outpatient follow-up with psychiatry As needed Haldol 

## 2024-05-12 NOTE — Assessment & Plan Note (Addendum)
 Nutrition Problem: Increased nutrient needs Etiology: acute illness Signs/Symptoms: estimated needs Interventions: Ensure Enlive (each supplement provides 350kcal and 20 grams of protein), MVI

## 2024-05-12 NOTE — Assessment & Plan Note (Addendum)
 Initially low BPs secondary to sedation BP normalized, currently 126/85 TSH WNL in October Propranolol  intermittently being held

## 2024-05-12 NOTE — Plan of Care (Signed)

## 2024-05-12 NOTE — Assessment & Plan Note (Addendum)
 Has two-physician signed DNR Palliative care was also involved in the care at that time and has followed the patient throughout this hospitalization  Patient does not have capacity to make his own medical decisions APS is involved, but guardianship has been denied  At this time, he is awaiting LTC placement He reports that he has Medicaid but this appears to be for psychiatric coverage only Once his payor source is clarified, he can be transferred for LTC

## 2024-05-12 NOTE — Progress Notes (Signed)
 " Progress Note   Patient: [name] [name] FMW:[id number] DOB: May 24, 1965 DOA: 03/02/2024     59 DOS: the patient was seen and examined on 05/12/2024   Brief hospital course: 59yo with h/o type 1 bipolar d/o, tardive dyskinesia, T2DM, frequent falls, and HLD who presented on 10/18 with AMS.  He was initially boarded in the ER and was eventually admitted for evaluation of encephalopathy on 10/24.  He lacks capacity but the state has denied guardianship.  He is awaiting SNF placement.      Assessment & Plan Acute encephalopathy Acute delirium Bipolar 1 disorder (HCC) Tardive dyskinesia Acute metabolic encephalopathy thought to be multifactorial given underlying cognitive impairment complicated by dehydration, and acute delirium CTA and MRI brain negative for acute findings EEG negative x 3 TSH, B12, ammonia, thiamine  WNL RPR: Nonreactive  Delirium and fall precautions Frequent reorientation Psychiatry is seeing the patient during this hospitalization and has signed off Continue with Depakote , duloxetine , hydroxyzine , propranolol , valbenazine  as currently prescribed Will need close outpatient follow-up with psychiatry As needed Haldol  Tremor This is bothersome to the patient He has been started on propranolol  and appears to be tolerating it well Consider increasing dose of propranolol  to see if this will help improve the tremors Pressure injury of skin Wound 05/05/24 2200 Pressure Injury Coccyx Mid;Left;Right Stage 1 -  Intact skin with non-blanchable redness of a localized area usually over a bony prominence. (Active)  Not POA Type 2 diabetes mellitus with hyperglycemia, without long-term current use of insulin  (HCC) Hemoglobin A1c 6.3%, good control Continue metformin   Mixed hyperlipidemia Continue statin  Essential hypertension Initially low BPs secondary to sedation BP normalized, currently 126/85 TSH WNL in October Propranolol  intermittently being held Increased nutritional  needs Nutrition Problem: Increased nutrient needs Etiology: acute illness Signs/Symptoms: estimated needs Interventions: Ensure Enlive (each supplement provides 350kcal and 20 grams of protein), MVI DNR (do not resuscitate) Goals of care, counseling/discussion Has two-physician signed DNR Palliative care was also involved in the care at that time and has followed the patient throughout this hospitalization  Patient does not have capacity to make his own medical decisions APS is involved, but guardianship has been denied  At this time, he is awaiting LTC placement He reports that he has Medicaid but this appears to be for psychiatric coverage only Once his payor source is clarified, he can be transferred for LTC      Consultants: Psychiatry Neurology Pulmonology Palliative Care Dietician PT OT ICM team   Procedures: EEG 10/30   Antibiotics: Augmentin  11/3-8  30 Day Unplanned Readmission Risk Score    Flowsheet Row ED to Hosp-Admission (Current) from 03/02/2024 in [facility]  30 Day Unplanned Readmission Risk Score (%) 26.63 Filed at 05/12/2024 0400    60 This score is the patient's risk of an unplanned readmission within 30 days of being discharged (0 -100%). The score is based on dignosis, age, lab data, medications, orders, and past utilization.   Low:  0-14.9   Medium: 15-21.9   High: 22-29.9   Extreme: 30 and above           Subjective: Tremor is a little better today.  Was up in char to eat breakfast and just got back in bed.  No specific complaints.   Objective: Vitals:   05/11/24 1946 05/12/24 0543  BP: 104/63 107/65  Pulse: 84 (!) 59  Resp: 19 17  Temp: 97.8 F (36.6 C) (!) 97.5 F (36.4 C)  SpO2: 95% 95%  Intake/Output Summary (Last 24 hours) at 05/12/2024 1548 Last data filed at 05/12/2024 9061 Gross per 24 hour  Intake 240 ml  Output --  Net 240 ml   Filed Weights   03/07/24 2229 03/26/24 1715   Weight: 87.4 kg 83 kg    Exam:  General:  Appears calm and comfortable and is in NAD Eyes:  normal lids, iris ENT:  grossly normal hearing, lips & tongue, mmm Cardiovascular:  RRR. No LE edema.  Respiratory:   CTA bilaterally with no wheezes/rales/rhonchi.  Normal respiratory effort. Abdomen:  soft, NT, ND Skin:  no rash or induration seen on limited exam Musculoskeletal:  grossly normal tone BUE/BLE, good ROM, no bony abnormality Psychiatric:  pleasant mood and affect, speech fluent and appropriate, AOx3 Neurologic:  CN 2-12 grossly intact, moves all extremities in coordinated fashion, mild tremor today  Data Reviewed: I have reviewed the patient's lab results since admission.  Pertinent labs for today include:   None today     Family Communication: None  Mobility: PT/OT Consulted and are recommending - Recommend Snf, Barriers To Snf Placement - Toc To F/U With Patient/Family For D/C Plans1/05/2024 1200    Code Status: Limited: Do not attempt resuscitation (DNR) -DNR-LIMITED -Do Not Intubate/DNI    Disposition: Status is: Inpatient Remains inpatient appropriate because: awaiting placement     Time spent: 35 minutes  Unresulted Labs (From admission, onward)     Start     Ordered   05/13/24 0500  CBC  Tomorrow morning,   R       Question:  Specimen collection method  Answer:  Lab=Lab collect   05/12/24 0759   05/13/24 0500  Basic metabolic panel with GFR  Tomorrow morning,   R       Question:  Specimen collection method  Answer:  Lab=Lab collect   05/12/24 0759             Author: Delon Herald, MD 05/12/2024 3:48 PM  For on call review [email or web address]            "

## 2024-05-12 NOTE — Assessment & Plan Note (Addendum)
 Wound 05/05/24 2200 Pressure Injury Coccyx Mid;Left;Right Stage 1 -  Intact skin with non-blanchable redness of a localized area usually over a bony prominence. (Active)  Not POA

## 2024-05-12 NOTE — Assessment & Plan Note (Addendum)
 Hemoglobin A1c 6.3%, good control Continue metformin 

## 2024-05-12 NOTE — Assessment & Plan Note (Addendum)
"   Continue statin   "

## 2024-05-13 DIAGNOSIS — G9340 Encephalopathy, unspecified: Secondary | ICD-10-CM | POA: Diagnosis not present

## 2024-05-13 LAB — CBC
HCT: 44.6 % (ref 39.0–52.0)
Hemoglobin: 15.4 g/dL (ref 13.0–17.0)
MCH: 29.8 pg (ref 26.0–34.0)
MCHC: 34.5 g/dL (ref 30.0–36.0)
MCV: 86.4 fL (ref 80.0–100.0)
Platelets: 181 K/uL (ref 150–400)
RBC: 5.16 MIL/uL (ref 4.22–5.81)
RDW: 15.8 % — ABNORMAL HIGH (ref 11.5–15.5)
WBC: 8.4 K/uL (ref 4.0–10.5)
nRBC: 0 % (ref 0.0–0.2)

## 2024-05-13 LAB — BASIC METABOLIC PANEL WITH GFR
Anion gap: 10 (ref 5–15)
BUN: 15 mg/dL (ref 6–20)
CO2: 27 mmol/L (ref 22–32)
Calcium: 9.1 mg/dL (ref 8.9–10.3)
Chloride: 99 mmol/L (ref 98–111)
Creatinine, Ser: 0.68 mg/dL (ref 0.61–1.24)
GFR, Estimated: >60 mL/min
Glucose, Bld: 84 mg/dL (ref 70–99)
Potassium: 4 mmol/L (ref 3.5–5.1)
Sodium: 135 mmol/L (ref 135–145)

## 2024-05-13 NOTE — Assessment & Plan Note (Signed)
 Initially low BPs secondary to sedation BP normalized, currently 126/85 TSH WNL in October Propranolol  intermittently being held

## 2024-05-13 NOTE — Assessment & Plan Note (Signed)
 Hemoglobin A1c 6.3%, good control Continue metformin 

## 2024-05-13 NOTE — Assessment & Plan Note (Signed)
 This is bothersome to the patient He has been started on propranolol  and appears to be tolerating it well Consider increasing dose of propranolol  to see if this will help improve the tremors

## 2024-05-13 NOTE — Plan of Care (Signed)

## 2024-05-13 NOTE — Assessment & Plan Note (Signed)
 Nutrition Problem: Increased nutrient needs Etiology: acute illness Signs/Symptoms: estimated needs Interventions: Ensure Enlive (each supplement provides 350kcal and 20 grams of protein), MVI

## 2024-05-13 NOTE — Progress Notes (Signed)
 " Progress Note   Patient: [name] [name] FMW:[id number] DOB: [birth year] DOA: 03/02/2024     66 DOS: the patient was seen and examined on 05/13/2024   Brief hospital course: 58yo with h/o type 1 bipolar d/o, tardive dyskinesia, T2DM, frequent falls, and HLD who presented on 10/18 with AMS.  He was initially boarded in the ER and was eventually admitted for evaluation of encephalopathy on 10/24.  He lacks capacity but the state has denied guardianship.  He is awaiting SNF placement.      Assessment & Plan Acute encephalopathy Acute delirium Bipolar 1 disorder (HCC) Tardive dyskinesia Acute metabolic encephalopathy thought to be multifactorial given underlying cognitive impairment complicated by dehydration, and acute delirium CTA and MRI brain negative for acute findings EEG negative x 3 TSH, B12, ammonia, thiamine  WNL RPR: Nonreactive  Delirium and fall precautions Frequent reorientation Psychiatry is seeing the patient during this hospitalization and has signed off Continue with Depakote , duloxetine , hydroxyzine , propranolol , valbenazine  as currently prescribed Will need close outpatient follow-up with psychiatry As needed Haldol  Tremor This is bothersome to the patient He has been started on propranolol  and appears to be tolerating it well Consider increasing dose of propranolol  to see if this will help improve the tremors Pressure injury of skin Wound 05/05/24 2200 Pressure Injury Coccyx Mid;Left;Right Stage 1 -  Intact skin with non-blanchable redness of a localized area usually over a bony prominence. (Active)  Not POA Type 2 diabetes mellitus with hyperglycemia, without long-term current use of insulin  (HCC) Hemoglobin A1c 6.3%, good control Continue metformin   Mixed hyperlipidemia Continue statin  Essential hypertension Initially low BPs secondary to sedation BP normalized, currently 126/85 TSH WNL in October Propranolol  intermittently being held Increased nutritional  needs Nutrition Problem: Increased nutrient needs Etiology: acute illness Signs/Symptoms: estimated needs Interventions: Ensure Enlive (each supplement provides 350kcal and 20 grams of protein), MVI DNR (do not resuscitate) Goals of care, counseling/discussion Has two-physician signed DNR Palliative care was also involved in the care at that time and has followed the patient throughout this hospitalization  Patient does not have capacity to make his own medical decisions APS is involved, but guardianship has been denied  At this time, he is awaiting LTC placement He reports that he has Medicaid but this appears to be for psychiatric coverage only Once his payor source is clarified, he can be transferred for LTC      Consultants: Psychiatry Neurology Pulmonology Palliative Care Dietician PT OT ICM team   Procedures: EEG 10/30   Antibiotics: Augmentin  11/3-8  30 Day Unplanned Readmission Risk Score    Flowsheet Row ED to Hosp-Admission (Current) from 03/02/2024 in [facility]  30 Day Unplanned Readmission Risk Score (%) 26.63 Filed at 05/13/2024 0801    This score is the patient's risk of an unplanned readmission within 30 days of being discharged (0 -100%). The score is based on dignosis, age, lab data, medications, orders, and past utilization.   Low:  0-14.9   Medium: 15-21.9   High: 22-29.9   Extreme: 30 and above           Subjective: No specific complaints today.  Propranolol  held this AM for lower BP but it is up to 125/59 now and the propranolol  is at least somewhat helpful for tremor so will give now.   Objective: Vitals:   05/12/24 1955 05/13/24 0529  BP: 118/72 106/69  Pulse: 68 (!) 57  Resp: 18 19  Temp: 98.3 F (36.8 C)  97.8 F (36.6 C)  SpO2: 98% 97%    Intake/Output Summary (Last 24 hours) at 05/13/2024 0921 Last data filed at 05/12/2024 9061 Gross per 24 hour  Intake 240 ml  Output --  Net 240 ml    Filed Weights   03/07/24 2229 03/26/24 1715  Weight: 87.4 kg 83 kg    Exam:  General:  Appears calm and comfortable and is in NAD Eyes:  normal lids, iris ENT:  grossly normal hearing, lips & tongue, mmm Cardiovascular:  RRR. No LE edema.  Respiratory:   CTA bilaterally with no wheezes/rales/rhonchi.  Normal respiratory effort. Abdomen:  soft, NT, ND Skin:  no rash or induration seen on limited exam Musculoskeletal:  grossly normal tone BUE/BLE, good ROM, no bony abnormality Psychiatric: blunted mood and affect, speech fluent and appropriate, AOx3 Neurologic:  CN 2-12 grossly intact, moves all extremities in coordinated fashion, + tremor  Data Reviewed: I have reviewed the patient's lab results since admission.  Pertinent labs for today include:   Normal BMP Unremarkable CBC     Family Communication: None present  Mobility: PT/OT Consulted and are recommending - Recommend Snf, Barriers To Snf Placement - Toc To F/U With Patient/Family For D/C Plans1/05/2024 1200    Code Status: Limited: Do not attempt resuscitation (DNR) -DNR-LIMITED -Do Not Intubate/DNI      Disposition: Status is: Inpatient Remains inpatient appropriate because: awaiting placement     Time spent: 35 minutes  Unresulted Labs (From admission, onward)    None        Author: Delon Herald, MD 05/13/2024 9:21 AM  For on call review [email or web address]            "

## 2024-05-13 NOTE — Assessment & Plan Note (Signed)
 Acute metabolic encephalopathy thought to be multifactorial given underlying cognitive impairment complicated by dehydration, and acute delirium CTA and MRI brain negative for acute findings EEG negative x 3 TSH, B12, ammonia, thiamine  WNL RPR: Nonreactive  Delirium and fall precautions Frequent reorientation Psychiatry is seeing the patient during this hospitalization and has signed off Continue with Depakote , duloxetine , hydroxyzine , propranolol , valbenazine  as currently prescribed Will need close outpatient follow-up with psychiatry As needed Haldol 

## 2024-05-13 NOTE — Assessment & Plan Note (Signed)
 Has two-physician signed DNR Palliative care was also involved in the care at that time and has followed the patient throughout this hospitalization  Patient does not have capacity to make his own medical decisions APS is involved, but guardianship has been denied  At this time, he is awaiting LTC placement He reports that he has Medicaid but this appears to be for psychiatric coverage only Once his payor source is clarified, he can be transferred for LTC

## 2024-05-13 NOTE — Assessment & Plan Note (Signed)
"   Continue statin   "

## 2024-05-13 NOTE — Assessment & Plan Note (Signed)
 Wound 05/05/24 2200 Pressure Injury Coccyx Mid;Left;Right Stage 1 -  Intact skin with non-blanchable redness of a localized area usually over a bony prominence. (Active)  Not POA

## 2024-05-14 DIAGNOSIS — G9340 Encephalopathy, unspecified: Secondary | ICD-10-CM | POA: Diagnosis not present

## 2024-05-14 MED ORDER — GUAIFENESIN-DM 100-10 MG/5ML PO SYRP
5.0000 mL | ORAL_SOLUTION | ORAL | Status: DC | PRN
  Administered 2024-05-14: 5 mL via ORAL
  Filled 2024-05-14: qty 5

## 2024-05-14 NOTE — Assessment & Plan Note (Signed)
 Has two-physician signed DNR Palliative care was also involved in the care at that time and has followed the patient throughout this hospitalization  Patient does not have capacity to make his own medical decisions APS is involved, but guardianship has been denied  At this time, he is awaiting LTC placement He reports that he has Medicaid but this appears to be for psychiatric coverage only Once his payor source is clarified, he can be transferred for LTC

## 2024-05-14 NOTE — Assessment & Plan Note (Signed)
"   Continue statin   "

## 2024-05-14 NOTE — Progress Notes (Signed)
 Physical Therapy Treatment Patient Details Name: [name] [name] MRN: [id number] DOB: 03-Sep-1965 Today's Date: 05/14/2024   History of Present Illness Pt is a 59 y/o male presenting on 10/24 for fall and chronic back pain. 10/19 psychiatry consulted due to suicidal thoughts, cleared on 10/22. Change in mental status due to acute delirium, and admitted from ED with acute metabolic encephalopathy. CT negative, EEG negative, MRI pending, chest x ray with hypoventilation. PMH includes: bipolar disorder type 1, tardive dyskinesia, PTSD, MDD, CAD, DM2, OA, tremors.    PT Comments  Pt ambulated 250' with RW, no loss of balance. Min assist for bed mobility and for balance 2* posterior lean initially upon standing. Pt denied pain throughout session.    If plan is discharge home, recommend the following: Assistance with cooking/housework;Assist for transportation;Help with stairs or ramp for entrance;Direct supervision/assist for medications management;A little help with walking and/or transfers;A little help with bathing/dressing/bathroom;Direct supervision/assist for financial management   Can travel by private vehicle     Yes  Equipment Recommendations  None recommended by PT    Recommendations for Other Services       Precautions / Restrictions Precautions Precautions: Fall Recall of Precautions/Restrictions: Impaired Precaution/Restrictions Comments: tardive kyskinesia, chronic back pain Restrictions Weight Bearing Restrictions Per Provider Order: No     Mobility  Bed Mobility   Bed Mobility: Supine to Sit, Sit to Supine     Supine to sit: Min assist, HOB elevated, Used rails Sit to supine: Supervision   General bed mobility comments: min A to raise trunk    Transfers Overall transfer level: Needs assistance Equipment used: Rolling walker (2 wheels) Transfers: Sit to/from Stand Sit to Stand: Min assist           General transfer comment: VCs hand placement, min A for  balance 2* posterior lean initially upon standing    Ambulation/Gait   Gait Distance (Feet): 250 Feet Assistive device: Rolling walker (2 wheels) Gait Pattern/deviations: Decreased step length - right, Decreased step length - left, Trunk flexed Gait velocity: decreased     General Gait Details: steady, no loss of balance   Stairs             Wheelchair Mobility     Tilt Bed    Modified Rankin (Stroke Patients Only)       Balance Overall balance assessment: Needs assistance Sitting-balance support: Feet supported, No upper extremity supported Sitting balance-Leahy Scale: Fair Sitting balance - Comments: posterior bias severe this session requiring max assist to correct. Provided VC for technique although pt repeatedly stated that he couldn't help it instead of attempting correction first. Postural control: Posterior lean Standing balance support: Bilateral upper extremity supported, During functional activity, Reliant on assistive device for balance Standing balance-Leahy Scale: Poor Standing balance comment: min A for posterior lean initially upon standing                            Communication Communication Communication: No apparent difficulties  Cognition Arousal: Alert Behavior During Therapy: Flat affect                           PT - Cognition Comments: Questionable safety awareness and insight into limitations Following commands: Impaired Following commands impaired: Follows one step commands inconsistently, Follows one step commands with increased time    Cueing Cueing Techniques: Verbal cues  Exercises      General Comments  Pertinent Vitals/Pain Pain Assessment Pain Score: 0-No pain Faces Pain Scale: No hurt    Home Living                          Prior Function            PT Goals (current goals can now be found in the care plan section) Acute Rehab PT Goals Patient Stated Goal: Decreased  back pain; no more falls PT Goal Formulation: With patient Time For Goal Achievement: 05/23/24 Potential to Achieve Goals: Fair Progress towards PT goals: Progressing toward goals    Frequency    Min 2X/week      PT Plan      Co-evaluation              AM-PAC PT 6 Clicks Mobility   Outcome Measure  Help needed turning from your back to your side while in a flat bed without using bedrails?: A Little Help needed moving from lying on your back to sitting on the side of a flat bed without using bedrails?: A Little Help needed moving to and from a bed to a chair (including a wheelchair)?: A Little Help needed standing up from a chair using your arms (e.g., wheelchair or bedside chair)?: A Little Help needed to walk in hospital room?: A Little Help needed climbing 3-5 steps with a railing? : A Little 6 Click Score: 18    End of Session Equipment Utilized During Treatment: Gait belt Activity Tolerance: Patient tolerated treatment well Patient left: in bed;with bed alarm set;with call bell/phone within reach Nurse Communication: Mobility status PT Visit Diagnosis: History of falling (Z91.81);Unsteadiness on feet (R26.81);Other abnormalities of gait and mobility (R26.89);Muscle weakness (generalized) (M62.81);Repeated falls (R29.6)     Time: 1210-1221 PT Time Calculation (min) (ACUTE ONLY): 11 min  Charges:    $Gait Training: 8-22 mins PT General Charges $$ ACUTE PT VISIT: 1 Visit                     [name] PT 05/14/2024  Acute Rehabilitation Services  Office [phone]

## 2024-05-14 NOTE — Plan of Care (Signed)

## 2024-05-14 NOTE — Assessment & Plan Note (Signed)
 Acute metabolic encephalopathy thought to be multifactorial given underlying cognitive impairment complicated by dehydration, and acute delirium CTA and MRI brain negative for acute findings EEG negative x 3 TSH, B12, ammonia, thiamine  WNL RPR: Nonreactive  Delirium and fall precautions Frequent reorientation Psychiatry is seeing the patient during this hospitalization and has signed off Continue with Depakote , duloxetine , hydroxyzine , propranolol , valbenazine  as currently prescribed Will need close outpatient follow-up with psychiatry As needed Haldol 

## 2024-05-14 NOTE — Progress Notes (Signed)
 " Progress Note   Patient: [name] [name] FMW:[id number] DOB: 28-Mar-1966 DOA: 03/02/2024     68 DOS: the patient was seen and examined on 05/14/2024   Brief hospital course: 58yo with h/o type 1 bipolar d/o, tardive dyskinesia, T2DM, frequent falls, and HLD who presented on 10/18 with AMS.  He was initially boarded in the ER and was eventually admitted for evaluation of encephalopathy on 10/24.  He lacks capacity but the state has denied guardianship.  He is awaiting SNF placement.      Assessment & Plan Acute encephalopathy Acute delirium Bipolar 1 disorder (HCC) Tardive dyskinesia Acute metabolic encephalopathy thought to be multifactorial given underlying cognitive impairment complicated by dehydration, and acute delirium CTA and MRI brain negative for acute findings EEG negative x 3 TSH, B12, ammonia, thiamine  WNL RPR: Nonreactive  Delirium and fall precautions Frequent reorientation Psychiatry is seeing the patient during this hospitalization and has signed off Continue with Depakote , duloxetine , hydroxyzine , propranolol , valbenazine  as currently prescribed Will need close outpatient follow-up with psychiatry As needed Haldol  Tremor This is bothersome to the patient He has been started on propranolol  and appears to be tolerating it well Consider increasing dose of propranolol  to see if this will help improve the tremors Pressure injury of skin Wound 05/05/24 2200 Pressure Injury Coccyx Mid;Left;Right Stage 1 -  Intact skin with non-blanchable redness of a localized area usually over a bony prominence. (Active)  Not POA Type 2 diabetes mellitus with hyperglycemia, without long-term current use of insulin  (HCC) Hemoglobin A1c 6.3%, good control Continue metformin   Mixed hyperlipidemia Continue statin  Essential hypertension Initially low BPs secondary to sedation BP normalized, currently 126/85 TSH WNL in October Propranolol  intermittently being held Increased nutritional  needs Nutrition Problem: Increased nutrient needs Etiology: acute illness Signs/Symptoms: estimated needs Interventions: Ensure Enlive (each supplement provides 350kcal and 20 grams of protein), MVI DNR (do not resuscitate) Goals of care, counseling/discussion Has two-physician signed DNR Palliative care was also involved in the care at that time and has followed the patient throughout this hospitalization  Patient does not have capacity to make his own medical decisions APS is involved, but guardianship has been denied  At this time, he is awaiting LTC placement He reports that he has Medicaid but this appears to be for psychiatric coverage only Once his payor source is clarified, he can be transferred for LTC      Consultants: Psychiatry Neurology Pulmonology Palliative Care Dietician PT OT ICM team   Procedures: EEG 10/30   Antibiotics: Augmentin  11/3-8  30 Day Unplanned Readmission Risk Score    Flowsheet Row ED to Hosp-Admission (Current) from [facility]02/2024 in [facility]  30 Day Unplanned Readmission Risk Score (%) 26.63 Filed at 05/14/2024 0400    This score is the patient's risk of an unplanned readmission within 30 days of being discharged (0 -100%). The score is based on dignosis, age, lab data, medications, orders, and past utilization.   Low:  0-14.9   Medium: 15-21.9   High: 22-29.9   Extreme: 30 and above           Subjective: Feeling ok today.  Tremor is some better.  He was able to feed himself breakfast for the first time.   Objective: Vitals:   05/13/24 2035 05/14/24 0534  BP: 111/70 106/70  Pulse: 73 (!) 54  Resp:  18  Temp:  (!) 97.5 F (36.4 C)  SpO2:  94%   No intake or output data  in the 24 hours ending 05/14/24 0736 Filed Weights   03/07/24 2229 03/26/24 1715  Weight: 87.4 kg 83 kg    Exam:  General:  Appears calm and comfortable and is in NAD Eyes:  normal lids, iris ENT:  grossly  normal hearing, lips & tongue, mmm Cardiovascular:  RRR. No LE edema.  Respiratory:   CTA bilaterally with no wheezes/rales/rhonchi.  Normal respiratory effort. Abdomen:  soft, NT, ND Skin:  no rash or induration seen on limited exam Musculoskeletal:  generalized weakness, no bony abnormality Psychiatric:  blunted mood and affect, speech fluent and appropriate, AOx3 Neurologic:  CN 2-12 grossly intact, moves all extremities in coordinated fashion, mild tremor  Data Reviewed: I have reviewed the patient's lab results since admission.  Pertinent labs for today include:     None today   Family Communication: None - reports that he lived with his brother until he was kicked out and now has no family  Mobility: PT/OT Consulted and are recommending - Recommend Snf, Barriers To Snf Placement - Toc To F/U With Patient/Family For D/C Plans1/05/2024 1200    Code Status: Limited: Do not attempt resuscitation (DNR) -DNR-LIMITED -Do Not Intubate/DNI   Disposition: Status is: Inpatient Remains inpatient appropriate because: awaiting placement     Time spent: 35 minutes  Unresulted Labs (From admission, onward)    None        Author: Delon Herald, MD 05/14/2024 7:36 AM  For on call review [email or web address]            "

## 2024-05-14 NOTE — Assessment & Plan Note (Signed)
 Initially low BPs secondary to sedation BP normalized, currently 126/85 TSH WNL in October Propranolol  intermittently being held

## 2024-05-14 NOTE — Progress Notes (Signed)
 ICM Supervisor spoke to [facility] (Ship Broker) she stated she just learned of pt having no funding source. She was advised by ICM Supervisor to assist pt with Medicaid application and SSI application. She reported she would make time to come see the pt and get those started. ICM Supervisor inquired about guardianship, Sheffi stated APS would not take guardianship that pt was alert and able to make his own decisions. She also reported she would ask her supervisor about supporting the pt at a facility until payor source is identified.   ICM spoke to Director regarding placement. Left a message for Ms Drury at [facility]

## 2024-05-14 NOTE — Assessment & Plan Note (Signed)
 Hemoglobin A1c 6.3%, good control Continue metformin 

## 2024-05-14 NOTE — Assessment & Plan Note (Signed)
 This is bothersome to the patient He has been started on propranolol  and appears to be tolerating it well Consider increasing dose of propranolol  to see if this will help improve the tremors

## 2024-05-14 NOTE — Assessment & Plan Note (Signed)
 Nutrition Problem: Increased nutrient needs Etiology: acute illness Signs/Symptoms: estimated needs Interventions: Ensure Enlive (each supplement provides 350kcal and 20 grams of protein), MVI

## 2024-05-14 NOTE — Assessment & Plan Note (Signed)
 Wound 05/05/24 2200 Pressure Injury Coccyx Mid;Left;Right Stage 1 -  Intact skin with non-blanchable redness of a localized area usually over a bony prominence. (Active)  Not POA

## 2024-05-15 DIAGNOSIS — G9340 Encephalopathy, unspecified: Secondary | ICD-10-CM | POA: Diagnosis not present

## 2024-05-15 NOTE — Assessment & Plan Note (Addendum)
 Has two-physician signed DNR Palliative care was also involved in the care at that time and has followed the patient throughout this hospitalization  Patient does not have capacity to make his own medical decisions APS is involved, but guardianship has been denied  At this time, he is awaiting LTC placement He reports that he has Medicaid but this appears to be for psychiatric coverage only Per [facility] Medicaid representative on 1/6, it is recommended that we pursue SNF and if patient needs to be LTC his Medicaid will transition to cover him Will work on placement ASAP

## 2024-05-15 NOTE — Assessment & Plan Note (Addendum)
 Acute metabolic encephalopathy thought to be multifactorial given underlying cognitive impairment complicated by dehydration, and acute delirium CTA and MRI brain negative for acute findings EEG negative x 3 TSH, B12, ammonia, thiamine  WNL RPR: Nonreactive  Delirium and fall precautions Frequent reorientation Psychiatry is seeing the patient during this hospitalization and has signed off Continue with Depakote , duloxetine , hydroxyzine , propranolol , valbenazine  as currently prescribed Will need close outpatient follow-up with psychiatry As needed Haldol 

## 2024-05-15 NOTE — Assessment & Plan Note (Addendum)
 This is bothersome to the patient He has been started on propranolol  and appears to be tolerating it well Consider increasing dose of propranolol  to see if this will help improve the tremors

## 2024-05-15 NOTE — Progress Notes (Signed)
 OT Cancellation Note  Patient Details Name: [name] [name] MRN: [id number] DOB: 11-Mar-1966   Cancelled Treatment:    Reason Eval/Treat Not Completed: Patient declined, no reason specified Pt said he did not want to work with therapy today. He does not feel like it. Will follow up with pt at a later date.  Leita Howell, OTR/L,CBIS  Supplemental OT - [facility] and [facility] Secure Chat Preferred   05/15/2024, 11:05 AM

## 2024-05-15 NOTE — Assessment & Plan Note (Addendum)
 Initially low BPs secondary to sedation BP normalized, currently 126/85 TSH WNL in October Propranolol  intermittently being held

## 2024-05-15 NOTE — Assessment & Plan Note (Addendum)
 Nutrition Problem: Increased nutrient needs Etiology: acute illness Signs/Symptoms: estimated needs Interventions: Ensure Enlive (each supplement provides 350kcal and 20 grams of protein), MVI

## 2024-05-15 NOTE — Plan of Care (Signed)

## 2024-05-15 NOTE — Progress Notes (Signed)
 " Progress Note   Patient: [name] [name] FMW:[id number] DOB: [birth year] DOA: 03/02/2024     69 DOS: the patient was seen and examined on 05/15/2024   Brief hospital course: 58yo with h/o type 1 bipolar d/o, tardive dyskinesia, T2DM, frequent falls, and HLD who presented on 10/18 with AMS.  He was initially boarded in the ER and was eventually admitted for evaluation of encephalopathy on 10/24.  He lacks capacity but the state has denied guardianship.  He is awaiting SNF placement.      Assessment & Plan Acute encephalopathy Acute delirium Bipolar 1 disorder (HCC) Tardive dyskinesia Acute metabolic encephalopathy thought to be multifactorial given underlying cognitive impairment complicated by dehydration, and acute delirium CTA and MRI brain negative for acute findings EEG negative x 3 TSH, B12, ammonia, thiamine  WNL RPR: Nonreactive  Delirium and fall precautions Frequent reorientation Psychiatry is seeing the patient during this hospitalization and has signed off Continue with Depakote , duloxetine , hydroxyzine , propranolol , valbenazine  as currently prescribed Will need close outpatient follow-up with psychiatry As needed Haldol  Tremor This is bothersome to the patient He has been started on propranolol  and appears to be tolerating it well Consider increasing dose of propranolol  to see if this will help improve the tremors Pressure injury of skin Wound 05/05/24 2200 Pressure Injury Coccyx Mid;Left;Right Stage 1 -  Intact skin with non-blanchable redness of a localized area usually over a bony prominence. (Active)  Not POA Type 2 diabetes mellitus with hyperglycemia, without long-term current use of insulin  (HCC) Hemoglobin A1c 6.3%, good control Continue metformin   Mixed hyperlipidemia Continue statin  Essential hypertension Initially low BPs secondary to sedation BP normalized, currently 126/85 TSH WNL in October Propranolol  intermittently being held Increased nutritional  needs Nutrition Problem: Increased nutrient needs Etiology: acute illness Signs/Symptoms: estimated needs Interventions: Ensure Enlive (each supplement provides 350kcal and 20 grams of protein), MVI DNR (do not resuscitate) Goals of care, counseling/discussion Has two-physician signed DNR Palliative care was also involved in the care at that time and has followed the patient throughout this hospitalization  Patient does not have capacity to make his own medical decisions APS is involved, but guardianship has been denied  At this time, he is awaiting LTC placement He reports that he has Medicaid but this appears to be for psychiatric coverage only Per [facility] Medicaid representative on 1/6, it is recommended that we pursue SNF and if patient needs to be LTC his Medicaid will transition to cover him Will work on placement ASAP       Consultants: Psychiatry Neurology Pulmonology Palliative Care Dietician PT OT ICM team   Procedures: EEG 10/30   Antibiotics: Augmentin  11/3-8  30 Day Unplanned Readmission Risk Score    Flowsheet Row ED to Hosp-Admission (Current) from [facility]02/2024 in [facility]  30 Day Unplanned Readmission Risk Score (%) 26.88 Filed at 05/15/2024 0801    This score is the patient's risk of an unplanned readmission within 30 days of being discharged (0 -100%). The score is based on dignosis, age, lab data, medications, orders, and past utilization.   Low:  0-14.9   Medium: 15-21.9   High: 22-29.9   Extreme: 30 and above           Subjective: Notified that he might be placed soon. He is very sad about this and likes it here.  He appears to understand that this will be better for him long-term.   Objective: Vitals:   05/15/24 0929 05/15/24 1452  BP: (!) 99/58 (!) 138/126  Pulse: 65 75  Resp:  16  Temp:  97.7 F (36.5 C)  SpO2: 94% 97%   No intake or output data in the 24 hours ending 05/15/24 1815  Filed  Weights   03/07/24 2229 03/26/24 1715  Weight: 87.4 kg 83 kg    Exam:  General:  Appears calm and comfortable and is in NAD Eyes:  normal lids, iris ENT:  grossly normal hearing, lips & tongue, mmm Cardiovascular:  RRR. No LE edema.  Respiratory:   CTA bilaterally with no wheezes/rales/rhonchi.  Normal respiratory effort. Abdomen:  soft, NT, ND Skin:  no rash or induration seen on limited exam Musculoskeletal:  grossly normal tone BUE/BLE, good ROM, no bony abnormality Psychiatric:  grossly normal mood and affect, speech fluent and appropriate, AOx3 Neurologic:  CN 2-12 grossly intact, moves all extremities in coordinated fashion, + tremor  Data Reviewed: I have reviewed the patient's lab results since admission.  Pertinent labs for today include:   None today     Family Communication: None  Mobility: PT/OT Consulted and are recommending - Recommend Snf, Barriers To Snf Placement - Toc To F/U With Patient/Family For D/C Plans1/09/2024 1227    Code Status: Limited: Do not attempt resuscitation (DNR) -DNR-LIMITED -Do Not Intubate/DNI    Disposition: Status is: Inpatient Remains inpatient appropriate because: awaiting placement     Time spent: 35 minutes  Unresulted Labs (From admission, onward)    None        Author: Delon Herald, MD 05/15/2024 6:15 PM  For on call review [email or web address]            "

## 2024-05-15 NOTE — Assessment & Plan Note (Addendum)
"   Continue statin   "

## 2024-05-15 NOTE — TOC Progression Note (Addendum)
 Transition of Care [facility]) - Progression Note    Patient Details  Name: [name] [name] MRN: [id number] Date of Birth: 1965/11/26  [facility]) CM/SW Contact  Doneta Glenys DASEN, RN Phone Number: 05/15/2024, 9:16 AM  Clinical Narrative:   [facility] - waiting response  [facility] - waiting on response MUST ID [id number], PASRR - [phone] F Exp. 07/17/2024 9:16 AM [facility] Medicaid) [phone] recommended to pursue SNF and if patient needs to be LTC his medicaid will transition to to cover patient.      Barriers to Discharge: SNF Pending bed offer               Expected Discharge Plan and Services                                               Social Drivers of Health (SDOH) Interventions SDOH Screenings   Food Insecurity: Patient Unable To Answer (03/07/2024)  Housing: Unknown (03/07/2024)  Transportation Needs: Patient Unable To Answer (03/07/2024)  Utilities: Patient Unable To Answer (03/07/2024)  Depression (PHQ2-9): High Risk (01/26/2024)  Social Connections: Unknown (09/22/2021)   Received from [facility]  Tobacco Use: Low Risk (03/13/2024)    Readmission Risk Interventions     No data to display

## 2024-05-15 NOTE — Assessment & Plan Note (Addendum)
 Hemoglobin A1c 6.3%, good control Continue metformin 

## 2024-05-15 NOTE — Assessment & Plan Note (Addendum)
 Wound 05/05/24 2200 Pressure Injury Coccyx Mid;Left;Right Stage 1 -  Intact skin with non-blanchable redness of a localized area usually over a bony prominence. (Active)  Not POA

## 2024-05-16 DIAGNOSIS — G9340 Encephalopathy, unspecified: Secondary | ICD-10-CM | POA: Diagnosis not present

## 2024-05-16 NOTE — Plan of Care (Signed)

## 2024-05-16 NOTE — Plan of Care (Signed)

## 2024-05-16 NOTE — Progress Notes (Signed)
 Mobility Specialist Progress Note:   05/16/24 1411  Mobility  Activity Ambulated with assistance  Level of Assistance Minimal assist, patient does 75% or more  Assistive Device Front wheel walker  Distance Ambulated (ft) 250 ft  Activity Response Tolerated well  Mobility Referral Yes  Mobility visit 1 Mobility  Mobility Specialist Start Time (ACUTE ONLY) 1345  Mobility Specialist Stop Time (ACUTE ONLY) 1403  Mobility Specialist Time Calculation (min) (ACUTE ONLY) 18 min   Pt was received in bed and agreed to mobility. Min A bed mobility and sit to stand. No complaints/issues during ambulation. Returned to bed with all needs met. Call bell in reach and bed alarm on.  [employer] - Mobility Specialist

## 2024-05-16 NOTE — Progress Notes (Signed)
 [facility] Progress Note Patient: [name] [name] FMW:[id number] DOB: May 05, 1966  DOA: 03/02/2024 DOS: the patient was seen and examined on 05/16/2024  Brief Hospital Course: 58yo with h/o type 1 bipolar d/o, tardive dyskinesia, T2DM, frequent falls, and HLD who presented on 10/18 with AMS.  He was initially boarded in the ER and was eventually admitted for evaluation of encephalopathy on 10/24.  He is awaiting SNF placement.    Assessment and plan. Bipolar disorder. Acute delirium. Tardive dyskinesia. Acute metabolic encephalopathy. Presented as a dehydration.  Was delirious in the admission. CT as well as MRI brain negative for any acute abnormality. EEG negative. TSH, B12 ammonia level and thiamine  normal. RPR nonreactive. Patient was initially confused and was lacking capacity but now has full capacity. Continue current regimen.  Capacity evaluation. Patient has full capacity based on assessment on 05/16/2024. Able to tell me his name where he is why he is in the hospital and how he can seek help and assistance. Based on this I have asked him about his goals of care as well. In the past APS has denied guardianship as the patient also had capacity at the time of their evaluation.  Chronic tremor. Started on Inderal . Unsure whether this is effective. Will monitor for now.  T2DM with hyperglycemia. Hemoglobin A1c 6.3. For now on metformin . Unsure utility of this medication but will monitor.  HTN. Blood pressure was soft initially. Currently on Inderal .  Monitor.  Goals of care conversation. Patient was deemed a DNR with 2 physician when he had lack of capacity due to his comorbidities. At present patient is alert awake and oriented x 4 and has capacity. Patient would like to be resuscitated. Patient would like his aunt [name] to be his HCPOA. Chaplain consulted for [facility] documentation. DNR discontinued and switched to full code. Working on placement.  Subjective:  Denies any acute complaint.  No nausea no vomiting no fever no chills.  Physical Exam: Clear to auscultation. S1-S2 present.  Some chronic appearing purpleish discoloration which patient says chronic. Bowel sound present No edema. Resting tremor noted all over  Data Reviewed: I have Reviewed nursing notes, Vitals, and Lab results. I have ordered test including CBC and BMP  .    Disposition: Status is: Inpatient Remains inpatient appropriate because: Awaiting placement.  enoxaparin  (LOVENOX ) injection 40 mg Start: 03/08/24 0800   Family Communication: No one at bedside Level of care: Med-Surg   Vitals:   05/15/24 2007 05/15/24 2143 05/16/24 0443 05/16/24 1158  BP: (!) 117/90 (!) 117/90 101/62 105/71  Pulse: 68 75 (!) 56 67  Resp: 19  17 16   Temp: 98.2 F (36.8 C)  97.7 F (36.5 C) (!) 97.5 F (36.4 C)  TempSrc: Oral  Oral Oral  SpO2: 94%  97% 95%  Weight:      Height:         Author: Yetta Blanch, MD 05/16/2024 7:17 PM  Please look on [email or web address] to find out who is on call.

## 2024-05-16 NOTE — TOC Progression Note (Signed)
 Transition of Care [facility]) - Progression Note    Patient Details  Name: OSMANI KERSTEN MRN: [id number] Date of Birth: 1965-10-25  [facility]) CM/SW Contact  Doneta Glenys DASEN, RN Phone Number: 05/16/2024, 3:03 PM  Clinical Narrative:     CM called [facility] nursing liaison Ronal Fret [phone] - left message.    Barriers to Discharge: SNF Pending bed offer               Expected Discharge Plan and Services                                               Social Drivers of Health (SDOH) Interventions SDOH Screenings   Food Insecurity: Patient Unable To Answer (03/07/2024)  Housing: Unknown (03/07/2024)  Transportation Needs: Patient Unable To Answer (03/07/2024)  Utilities: Patient Unable To Answer (03/07/2024)  Depression (PHQ2-9): High Risk (01/26/2024)  Social Connections: Unknown (09/22/2021)   Received from [facility]  Tobacco Use: Low Risk (03/13/2024)    Readmission Risk Interventions     No data to display

## 2024-05-16 NOTE — Progress Notes (Signed)
 Mobility Specialist Progress Note:   05/16/24 1149  Mobility  Activity Ambulated with assistance  Level of Assistance Minimal assist, patient does 75% or more  Assistive Device Front wheel walker  Distance Ambulated (ft) 15 ft  Activity Response Tolerated well  Mobility Referral Yes  Mobility visit 1 Mobility  Mobility Specialist Start Time (ACUTE ONLY) 1126  Mobility Specialist Stop Time (ACUTE ONLY) 1135  Mobility Specialist Time Calculation (min) (ACUTE ONLY) 9 min   Pt was received in bathroom and requested assistance back to bed. Min A sit to stand. Returned to bed with all needs met. Call bell in reach and bed alarm on.  [employer] - Mobility Specialist

## 2024-05-17 DIAGNOSIS — G9340 Encephalopathy, unspecified: Secondary | ICD-10-CM | POA: Diagnosis not present

## 2024-05-17 MED ORDER — BENZTROPINE MESYLATE 1 MG PO TABS
1.0000 mg | ORAL_TABLET | Freq: Two times a day (BID) | ORAL | Status: DC
  Administered 2024-05-17 – 2024-05-20 (×8): 1 mg via ORAL
  Filled 2024-05-17 (×9): qty 1

## 2024-05-17 MED ORDER — ALUM & MAG HYDROXIDE-SIMETH 200-200-20 MG/5ML PO SUSP: 30.0000 mL | Freq: Four times a day (QID) | ORAL | Status: DC | PRN

## 2024-05-17 NOTE — Plan of Care (Signed)

## 2024-05-17 NOTE — Progress Notes (Signed)
 Physical Therapy Treatment Patient Details Name: [name] [name] MRN: [id number] DOB: 1965-08-27 Today's Date: 05/17/2024   History of Present Illness Pt is a 59 y/o male presenting on 10/24 for fall and chronic back pain. 10/19 psychiatry consulted due to suicidal thoughts, cleared on 10/22. Change in mental status due to acute delirium, and admitted from ED with acute metabolic encephalopathy. CT negative, EEG negative, MRI pending, chest x ray with hypoventilation. PMH includes: bipolar disorder type 1, tardive dyskinesia, PTSD, MDD, CAD, DM2, OA, tremors.    PT Comments  Pt AxO x 2 pleasant and willing.  Assisted OOB to amb in hallway.  General transfer comment: VCs hand placement, min A for balance 2* posterior lean initially upon standing  General Gait Details: light use of walker for balance.  Alternating gait with low clearance steps.  Rigid throughout. Assisted back to bed per Pt request.   Pt awaiting on LTC placement.     If plan is discharge home, recommend the following: Assistance with cooking/housework;Assist for transportation;Help with stairs or ramp for entrance;Direct supervision/assist for medications management;A little help with walking and/or transfers;A little help with bathing/dressing/bathroom;Direct supervision/assist for financial management   Can travel by private vehicle     Yes  Equipment Recommendations  None recommended by PT    Recommendations for Other Services       Precautions / Restrictions Precautions Precautions: Fall Precaution/Restrictions Comments: tardive kyskinesia, chronic back pain Restrictions Weight Bearing Restrictions Per Provider Order: No Other Position/Activity Restrictions: chronic back pain, history of Parkinson's disease     Mobility  Bed Mobility Overal bed mobility: Needs Assistance       Supine to sit: Min assist, HOB elevated, Used rails Sit to supine: Supervision   General bed mobility comments: min A to raise  trunk    Transfers Overall transfer level: Needs assistance Equipment used: Rolling walker (2 wheels) Transfers: Sit to/from Stand Sit to Stand: Min assist, Contact guard assist           General transfer comment: VCs hand placement, min A for balance 2* posterior lean initially upon standing    Ambulation/Gait Ambulation/Gait assistance: Contact guard assist, Min assist Gait Distance (Feet): 225 Feet Assistive device: Rolling walker (2 wheels) Gait Pattern/deviations: Decreased step length - right, Decreased step length - left, Trunk flexed Gait velocity: decreased     General Gait Details: light use of walker for balance.  Alternating gait with low clearance steps.  Rigid throughout.   Stairs             Wheelchair Mobility     Tilt Bed    Modified Rankin (Stroke Patients Only)       Balance                                            Communication Communication Communication: No apparent difficulties  Cognition Arousal: Alert Behavior During Therapy: Flat affect                           PT - Cognition Comments: Questionable safety awareness and insight into limitations Following commands: Impaired Following commands impaired: Follows one step commands inconsistently, Follows one step commands with increased time    Cueing Cueing Techniques: Verbal cues  Exercises      General Comments        Pertinent Vitals/Pain  Pain Assessment Pain Assessment: No/denies pain    Home Living                          Prior Function            PT Goals (current goals can now be found in the care plan section) Progress towards PT goals: Progressing toward goals    Frequency    Min 2X/week      PT Plan      Co-evaluation              AM-PAC PT 6 Clicks Mobility   Outcome Measure  Help needed turning from your back to your side while in a flat bed without using bedrails?: A Little Help needed  moving from lying on your back to sitting on the side of a flat bed without using bedrails?: A Little Help needed moving to and from a bed to a chair (including a wheelchair)?: A Little Help needed standing up from a chair using your arms (e.g., wheelchair or bedside chair)?: A Little Help needed to walk in hospital room?: A Little Help needed climbing 3-5 steps with a railing? : A Little 6 Click Score: 18    End of Session Equipment Utilized During Treatment: Gait belt Activity Tolerance: Patient tolerated treatment well Patient left: in bed;with bed alarm set;with call bell/phone within reach Nurse Communication: Mobility status PT Visit Diagnosis: History of falling (Z91.81);Unsteadiness on feet (R26.81);Other abnormalities of gait and mobility (R26.89);Muscle weakness (generalized) (M62.81);Repeated falls (R29.6)     Time: 8670-8652 PT Time Calculation (min) (ACUTE ONLY): 18 min  Charges:    $Gait Training: 8-22 mins PT General Charges $$ ACUTE PT VISIT: 1 Visit                     {Abijah Roussel  PTA Acute  Rehabilitation Services Office M-F          [phone]

## 2024-05-17 NOTE — Progress Notes (Signed)
" °   05/17/24 1000  Spiritual Encounters  Type of Visit Initial  Care provided to: Patient  Referral source Patient request  Reason for visit Advance directives  OnCall Visit No  Spiritual Framework  Presenting Themes Meaning/purpose/sources of inspiration;Goals in life/care;Values and beliefs;Significant life change;Caregiving needs;Coping tools;Impactful experiences and emotions;Courage hope and growth;Rituals and practive;Community and relationships  Patient Stress Factors Loss of control;Major life changes  Family Stress Factors None identified  Interventions  Spiritual Care Interventions Made Established relationship of care and support;Compassionate presence;Reflective listening;Normalization of emotions;Explored ethical dilemma;Decision-making support/facilitation;Reconciliation with self/others;Narrative/life review;Explored values/beliefs/practices/strengths;Meaning making;Prayer;Encouragement  Spiritual Care Plan  Spiritual Care Issues Still Outstanding Chaplain will continue to follow     05/17/24 1000  Spiritual Encounters  Type of Visit Initial  Care provided to: Patient  Referral source Patient request  Reason for visit Advance directives  OnCall Visit No  Spiritual Framework  Presenting Themes Meaning/purpose/sources of inspiration;Goals in life/care;Values and beliefs;Significant life change;Caregiving needs;Coping tools;Impactful experiences and emotions;Courage hope and growth;Rituals and practive;Community and relationships  Patient Stress Factors Loss of control;Major life changes  Family Stress Factors None identified  Interventions  Spiritual Care Interventions Made Established relationship of care and support;Compassionate presence;Reflective listening;Normalization of emotions;Explored ethical dilemma;Decision-making support/facilitation;Reconciliation with self/others;Narrative/life review;Explored values/beliefs/practices/strengths;Meaning  making;Prayer;Encouragement  Spiritual Care Plan  Spiritual Care Issues Still Outstanding Chaplain will continue to follow   Chaplain met with patient this monring and sat down and reviewed AD paperwork and decision making process.  Pt spoke at length ho0w he needed to get this done  and Chaplain provided iinsight and counsel and support.  Will continmue to follow up with finalizing paperwork.   "

## 2024-05-17 NOTE — TOC Progression Note (Addendum)
 Transition of Care [facility]) - Progression Note    Patient Details  Name: [name] [name] MRN: [id number] Date of Birth: 1966/01/19  [facility] of [facility] CM/SW Contact  Doneta Glenys DASEN, RN Phone Number: 05/17/2024, 8:14 AM  Clinical Narrative:    1:51 PM Mary returned call, CM not able to answer call. Returned call to [facility] with instructions to let me what information is needed to offer patient a bed. 11:55 AM CM called [facility] to request consideration for LTC. 8:58 AM CM spoke with Jill([employer]) with update on placement for LTC. CM called Wendi Musselman (ex S.O.) to see if patient has received any mail related to SS diability. No. [name] states she does not want to have any communication with [name]. 8:14 AM CM called [facility] nurse liaison Ronal Fret at [phone] message.     Barriers to Discharge: SNF Pending bed offer               Expected Discharge Plan and Services                                               Social Drivers of Health (SDOH) Interventions SDOH Screenings   Food Insecurity: Patient Unable To Answer (03/07/2024)  Housing: Unknown (03/07/2024)  Transportation Needs: Patient Unable To Answer (03/07/2024)  Utilities: Patient Unable To Answer (03/07/2024)  Depression (PHQ2-9): High Risk (01/26/2024)  Social Connections: Unknown (09/22/2021)   Received from [facility]  Tobacco Use: Low Risk (03/13/2024)    Readmission Risk Interventions     No data to display

## 2024-05-17 NOTE — Progress Notes (Signed)
 [facility] Progress Note Patient: [name] [name] FMW:[id number] DOB: 1965/12/15  DOA: 03/02/2024 DOS: the patient was seen and examined on 05/17/2024  Brief Hospital Course: 58yo with h/o type 1 bipolar d/o, tardive dyskinesia, T2DM, frequent falls, and HLD who presented on 10/18 with AMS.  He was initially boarded in the ER and was eventually admitted for evaluation of encephalopathy on 10/24.  He is awaiting SNF placement.   10/12.  Patient has been living with a friend for the last 2 to 3 years, patient was having bizarre behavior so friend did not let him come back to the apartment. Patient was boarded in the emergency room since 10/12. 10/19, psychiatry consulted as patient was endorsing suicidal thoughts-IVC 10/22, psychiatry cleared for discharge. 10/24, patient was noted to be encephalopathic so admitted to the hospital.  Multiple investigations were negative. 11/4, MRI under sedation, transient hypotension and altered mental status-overnight in ICU. 11/5, clinically improved.  Sedation wore off.  Transferred back to MedSurg bed.   11/18.  Persistent hypertension as well as multiple comorbidities, ongoing encephalopathy and patient was made DNR with two-physician consent. APS was involved although declined guardianship somewhere in December 2025. Referrals were sent for SNF in December 2025 05/16/2024 assessment shows patient has capacity.  Patient wanted to be full code.  Assessment and plan. Bipolar disorder. Acute delirium. Tardive dyskinesia. Acute metabolic encephalopathy. Presented with confusion due to dehydration. CT as well as MRI brain negative for any acute abnormality. EEG negative. TSH, B12 ammonia level and thiamine  normal. RPR nonreactive. Patient was initially confused and was lacking capacity but now has full capacity. Continue current regimen.   Capacity evaluation. Patient has full capacity based on assessment on 05/16/2024. Able to tell me his name,  where he is, why he is in the hospital and how he can seek help if he is in trouble and assistance. In the past APS has denied guardianship as the patient also had capacity at the time of their evaluation. Based on this I have asked him about his goals of care as well.   Chronic tremor. Chronically on Inderal  10 mg twice daily as well as benztropine  1 mg twice daily. Started back on Inderal .  Blood pressure soft therefore unable to uptitrate the medicine. Resuming benztropine . Check valproic acid  level and ammonia level again.  On last check they were normal.   T2DM with hyperglycemia. Hemoglobin A1c 6.3. For now on metformin . Unsure utility of this medication but will monitor.   HTN. Blood pressure was soft initially. Currently on Inderal .  Monitor.   Goals of care conversation. Patient was deemed a DNR with 2 physician when he had lack of capacity due to his comorbidities. At present patient is alert awake and oriented x 4 and has capacity. Patient would like to be resuscitated. Patient would like his aunt [name] to be his HCPOA. Chaplain consulted for [facility] documentation. DNR discontinued and switched to full code. Working on placement.   Subjective: No nausea no vomiting.  Denies any acute complaint.  Physical Exam: Clear to auscultation. Bowel sounds are present no edema. Unchanged purplish discoloration of the skin on her face.  Data Reviewed: I have Reviewed nursing notes, Vitals, and Lab results. I have ordered test including LFT, ammonia level, valproic acid  level  .   Disposition: Status is: Inpatient Remains inpatient appropriate because: Awaiting placement.  enoxaparin  (LOVENOX ) injection 40 mg Start: 03/08/24 0800   Family Communication: No at bedside Level of care: Med-Surg   Vitals:  05/16/24 1158 05/16/24 2027 05/17/24 0508 05/17/24 1502  BP: 105/71 100/65 104/62 103/67  Pulse: 67 67 67 81  Resp: 16 18 18 16   Temp: (!) 97.5 F (36.4 C) 97.6 F (36.4  C) 97.8 F (36.6 C) 97.8 F (36.6 C)  TempSrc: Oral Oral Oral   SpO2: 95% 96% 92% 93%  Weight:      Height:         Author: Yetta Blanch, MD 05/17/2024 4:01 PM  Please look on [email or web address] to find out who is on call.

## 2024-05-18 DIAGNOSIS — G9340 Encephalopathy, unspecified: Secondary | ICD-10-CM | POA: Diagnosis not present

## 2024-05-18 LAB — HEPATIC FUNCTION PANEL
ALT: 25 U/L (ref 0–44)
AST: 20 U/L (ref 15–41)
Albumin: 3.4 g/dL — ABNORMAL LOW (ref 3.5–5.0)
Alkaline Phosphatase: <10 U/L — ABNORMAL LOW (ref 38–126)
Bilirubin, Direct: 0.1 mg/dL (ref 0.0–0.2)
Indirect Bilirubin: 0.2 mg/dL — ABNORMAL LOW (ref 0.3–0.9)
Total Bilirubin: 0.3 mg/dL (ref 0.0–1.2)
Total Protein: 5.5 g/dL — ABNORMAL LOW (ref 6.5–8.1)

## 2024-05-18 LAB — AMMONIA: Ammonia: 35 umol/L (ref 9–35)

## 2024-05-18 LAB — VALPROIC ACID LEVEL: Valproic Acid Lvl: 76 ug/mL (ref 50–100)

## 2024-05-18 NOTE — Progress Notes (Signed)
 [facility] Progress Note Patient: [name] [name] FMW:[id number] DOB: July 05, 1965  DOA: 03/02/2024 DOS: the patient was seen and examined on 05/18/2024  Brief Hospital Course: 59yo with h/o type 1 bipolar d/o, tardive dyskinesia, T2DM, frequent falls, and HLD who presented on 10/18 with AMS.  He was initially boarded in the ER and was eventually admitted for evaluation of encephalopathy on 10/24.  He is awaiting SNF placement.   10/12.  Patient has been living with a friend for the last 2 to 3 years, patient was having bizarre behavior so friend did not let him come back to the apartment. Patient was boarded in the emergency room since 10/12. 10/19, psychiatry consulted as patient was endorsing suicidal thoughts-IVC 10/22, psychiatry cleared for discharge. 10/24, patient was noted to be encephalopathic so admitted to the hospital.  Multiple investigations were negative. 11/4, MRI under sedation, transient hypotension and altered mental status-overnight in ICU. 11/5, clinically improved.  Sedation wore off.  Transferred back to MedSurg bed.   11/18.  Persistent hypertension as well as multiple comorbidities, ongoing encephalopathy and patient was made DNR with two-physician consent. APS was involved although declined guardianship somewhere in December 2025. Referrals were sent for SNF in December 2025 05/16/2024 assessment shows patient has capacity.  Patient wanted to be full code. 1/9 LFT normal.  Ammonia 35, Depakote  level 76.  Assessment and plan. Bipolar disorder. Acute delirium. Tardive dyskinesia. Acute metabolic encephalopathy. Presented with confusion due to dehydration. CT as well as MRI brain negative for any acute abnormality. EEG negative. TSH, B12 ammonia level and thiamine  normal. RPR nonreactive. Patient was initially confused and was lacking capacity but now has full capacity. Continue current regimen.   Capacity evaluation. Patient has full capacity based on  assessment on 05/16/2024. Able to tell me his name, where he is, why he is in the hospital and how he can seek help if he is in trouble and assistance. In the past APS has denied guardianship as the patient also had capacity at the time of their evaluation. Based on this I have asked him about his goals of care as well.   Chronic tremor. Chronically on Inderal  10 mg twice daily as well as benztropine  1 mg twice daily. Started back on Inderal .  Blood pressure soft therefore unable to uptitrate the medicine. Improvement with benztropine . Valproic acid  level and ammonia level within normal range. Monitor.   T2DM with hyperglycemia. Hemoglobin A1c 6.3. For now on metformin . Unsure utility of this medication but will monitor.   HTN. Blood pressure was soft initially. Currently on Inderal .  Monitor.   Goals of care conversation. Patient was deemed a DNR with 2 physician when he had lack of capacity due to his comorbidities. At present patient is alert awake and oriented x 4 and has capacity. Patient would like to be resuscitated. Patient would like his aunt [name] to be his HCPOA. Chaplain consulted for [facility] documentation. DNR discontinued and switched to full code. Working on placement.   Subjective: No nausea or vomiting.  No acute complaint.  Physical Exam: Clear to auscultation No edema. Tremor present.  Improving.  Data Reviewed: I have Reviewed nursing notes, Vitals, and Lab results. Since last encounter, pertinent lab results Depakote  level, LFT and ammonia level   . I have ordered test including    .   Disposition: Status is: Inpatient Remains inpatient appropriate because: Awaiting placement.  Medically stable.  enoxaparin  (LOVENOX ) injection 40 mg Start: 03/08/24 0800   Family Communication: No limits at  Level of care: Med-Surg   Vitals:   05/17/24 2031 05/17/24 2115 05/18/24 0504 05/18/24 1145  BP: 108/67 108/67 (!) 101/59 100/68  Pulse: 79 79 (!) 58 80  Resp:  16  16 18   Temp: 98.3 F (36.8 C)  97.6 F (36.4 C) (!) 97.5 F (36.4 C)  TempSrc:    Oral  SpO2: 94%  97% 94%  Weight:      Height:         Author: Yetta Blanch, MD 05/18/2024 6:22 PM  Please look on [email or web address] to find out who is on call.

## 2024-05-18 NOTE — Progress Notes (Signed)
 Occupational Therapy Treatment Patient Details Name: [name] [name] MRN: [id number] DOB: 1965-07-21 Today's Date: 05/18/2024   History of present illness Pt is a 59 y/o male presenting on 10/24 for fall and chronic back pain. 10/19 psychiatry consulted due to suicidal thoughts, cleared on 10/22. Change in mental status due to acute delirium, and admitted from ED with acute metabolic encephalopathy. CT negative, EEG negative, MRI pending, chest x ray with hypoventilation. PMH includes: bipolar disorder type 1, tardive dyskinesia, PTSD, MDD, CAD, DM2, OA, tremors.   OT comments  Pt. Seen for skilled OT treatment session.  Bed mobility with MINA for trunk support coming supine to sit.  Able to complete sit/stand and in room ambulation with MIN A with heavy cues for RW management and safety while navigating around obstacles and through doorways.  Agree with current d/c recommendations.   Cont. With acute OT POC.        If plan is discharge home, recommend the following:  Assistance with cooking/housework;Direct supervision/assist for medications management;Direct supervision/assist for financial management;Assist for transportation;Help with stairs or ramp for entrance;A lot of help with walking and/or transfers;A lot of help with bathing/dressing/bathroom   Equipment Recommendations       Recommendations for Other Services      Precautions / Restrictions Precautions Precautions: Fall Recall of Precautions/Restrictions: Impaired Precaution/Restrictions Comments: tardive kyskinesia, chronic back pain Restrictions Other Position/Activity Restrictions: chronic back pain, history of Parkinson's disease       Mobility Bed Mobility Overal bed mobility: Needs Assistance Bed Mobility: Supine to Sit, Sit to Supine     Supine to sit: Min assist, HOB elevated, Used rails Sit to supine: Supervision   General bed mobility comments: min A to raise trunk    Transfers Overall transfer level:  Needs assistance Equipment used: Rolling walker (2 wheels) Transfers: Sit to/from Stand, Bed to chair/wheelchair/BSC Sit to Stand: Min assist     Step pivot transfers: Min assist     General transfer comment: VCs hand placement, min A for RW management running into objects on R side and not making adjustments or acknologing need to reposition RW     Balance                                           ADL either performed or assessed with clinical judgement   ADL Overall ADL's : Needs assistance/impaired     Grooming: Wash/dry hands;Contact guard assist;Standing               Lower Body Dressing: Sitting/lateral leans Lower Body Dressing Details (indicate cue type and reason): unable to achieve figure four while seated, reports he dons LB clothing bed level Toilet Transfer: Minimal assistance;Cueing for sequencing;Cueing for safety;Ambulation;Rolling walker (2 wheels);Grab Paramedic Details (indicate cue type and reason): max cues for RW management tends to hit doorways and objects on R side and requires cues to correct and reposition RW Toileting- Clothing Manipulation and Hygiene: Sitting/lateral lean Toileting - Clothing Manipulation Details (indicate cue type and reason): cues to initiate peri care.  pt. had passed a lot of gas.  when asked if he had wiped/cleaned up he state no required encouragement to wipe and check buttocks.     Functional mobility during ADLs: Minimal assistance;Cueing for sequencing;Cueing for safety;Rolling walker (2 wheels) General ADL Comments: agreeable to toileting and washing hands. 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[employer], [employer] Communication: No apparent difficulties Factors Affecting Communication: Other (comment)   Cognition Arousal: Alert Behavior During Therapy: Flat affect                                  Following commands: Impaired Following commands impaired: Follows one step commands inconsistently, Follows one step commands with increased time      Cueing   Cueing Techniques: Verbal cues  Exercises      Shoulder Instructions       General Comments      Pertinent Vitals/ Pain       Pain Assessment Pain Assessment: No/denies pain  Home Living                                          Prior Functioning/Environment              Frequency  Min 2X/week        Progress Toward Goals  OT Goals(current goals can now be found in the care plan section)  Progress towards OT goals: Progressing toward goals     Plan      Co-evaluation                 AM-PAC OT 6 Clicks Daily Activity     Outcome Measure   Help from another person eating meals?: None Help from another person taking care of personal grooming?: A Little Help from another person toileting, which includes using toliet, bedpan, or urinal?: A Lot Help from another person bathing (including washing, rinsing, drying)?: A Lot Help from another person to put on and taking off regular upper body clothing?: A Lot Help from another person to put on and taking off regular lower body clothing?: A Lot 6 Click Score: 15    End of Session Equipment Utilized During Treatment: Gait belt;Rolling walker (2 wheels)  OT Visit Diagnosis: Other abnormalities of gait and mobility (R26.89);Unsteadiness on feet (R26.81);Muscle weakness (generalized) (M62.81);History of falling (Z91.81);Other symptoms and signs involving cognitive function   Activity Tolerance Patient tolerated treatment well   Patient Left in bed;with call bell/phone within reach;with bed alarm set   Nurse Communication          Time: 8695-8682 OT Time Calculation (min): 13 min  Charges: OT General Charges $OT Visit: 1 Visit OT Treatments $Self Care/Home Management : 8-22 mins  Randall,  COTA/L Acute Rehabilitation [phone]   CHRISTELLA Nest Lorraine-COTA/L  05/18/2024, 2:06 PM

## 2024-05-18 NOTE — TOC Progression Note (Signed)
 Transition of Care [facility]) - Progression Note    Patient Details  Name: [name] [name] MRN: [id number] Date of Birth: 05-Oct-1965  Transition of Care [facility]) CM/SW Contact  Doneta Glenys DASEN, RN Phone Number: 05/18/2024, 12:46 PM  Clinical Narrative:    CM called [facility] nurse liaison Ronal Fret at [phone] - Left Message.     Barriers to Discharge: SNF Pending bed offer               Expected Discharge Plan and Services                                               Social Drivers of Health (SDOH) Interventions SDOH Screenings   Food Insecurity: Patient Unable To Answer (03/07/2024)  Housing: Unknown (03/07/2024)  Transportation Needs: Patient Unable To Answer (03/07/2024)  Utilities: Patient Unable To Answer (03/07/2024)  Depression (PHQ2-9): High Risk (01/26/2024)  Social Connections: Unknown (09/22/2021)   Received from [facility]  Tobacco Use: Low Risk (03/13/2024)    Readmission Risk Interventions     No data to display

## 2024-05-19 NOTE — Progress Notes (Signed)
 [facility] PROGRESS NOTE Patient: [name] [name] FMW:[id number]   PCP: [name], [name] DOB: [birth year]   DOA: 03/02/2024   DOS: 05/19/2024    Subjective: No nausea no vomiting no fever no chills.  Tremor still present and unchanged from baseline.  Objective:  Vitals:   05/18/24 1145 05/18/24 1941 05/18/24 2231 05/19/24 0311  BP: 100/68 (!) 104/59 112/73 110/71  Pulse: 80 66 63 69  Resp: 18 17 16 17   Temp: (!) 97.5 F (36.4 C) 98 F (36.7 C) 98.3 F (36.8 C) 98.1 F (36.7 C)  TempSrc: Oral Oral Oral   SpO2: 94% 96% 99% 94%  Weight:      Height:      Clear to auscultation. S1-S2 present No edema. Tremors present. Assessment and plan: Medically stable.  Awaiting placement.  Author: Yetta Blanch, MD [facility] 05/19/2024 4:36 PM   If 7PM-7AM, please contact night-coverage at [email or web address]

## 2024-05-19 NOTE — Plan of Care (Signed)
  Problem: Clinical Measurements: Goal: Will remain free from infection Outcome: Progressing Goal: Cardiovascular complication will be avoided Outcome: Progressing   Problem: Activity: Goal: Risk for activity intolerance will decrease Outcome: Progressing   Problem: Nutrition: Goal: Adequate nutrition will be maintained Outcome: Progressing   Problem: Coping: Goal: Level of anxiety will decrease Outcome: Progressing   Problem: Elimination: Goal: Will not experience complications related to bowel motility Outcome: Progressing Goal: Will not experience complications related to urinary retention Outcome: Progressing   Problem: Pain Managment: Goal: General experience of comfort will improve and/or be controlled Outcome: Progressing   Problem: Safety: Goal: Ability to remain free from injury will improve Outcome: Progressing   Problem: Skin Integrity: Goal: Risk for impaired skin integrity will decrease Outcome: Progressing

## 2024-05-19 NOTE — Plan of Care (Signed)

## 2024-05-20 DIAGNOSIS — G9340 Encephalopathy, unspecified: Secondary | ICD-10-CM | POA: Diagnosis not present

## 2024-05-20 LAB — CBC WITH DIFFERENTIAL/PLATELET
Abs Immature Granulocytes: 0.07 K/uL (ref 0.00–0.07)
Basophils Absolute: 0.1 K/uL (ref 0.0–0.1)
Basophils Relative: 1 %
Eosinophils Absolute: 0.1 K/uL (ref 0.0–0.5)
Eosinophils Relative: 2 %
HCT: 47.1 % (ref 39.0–52.0)
Hemoglobin: 16.5 g/dL (ref 13.0–17.0)
Immature Granulocytes: 1 %
Lymphocytes Relative: 37 %
Lymphs Abs: 3.1 K/uL (ref 0.7–4.0)
MCH: 29.9 pg (ref 26.0–34.0)
MCHC: 35 g/dL (ref 30.0–36.0)
MCV: 85.5 fL (ref 80.0–100.0)
Monocytes Absolute: 0.7 K/uL (ref 0.1–1.0)
Monocytes Relative: 9 %
Neutro Abs: 4.3 K/uL (ref 1.7–7.7)
Neutrophils Relative %: 50 %
Platelets: 208 K/uL (ref 150–400)
RBC: 5.51 MIL/uL (ref 4.22–5.81)
RDW: 15.7 % — ABNORMAL HIGH (ref 11.5–15.5)
WBC: 8.4 K/uL (ref 4.0–10.5)
nRBC: 0 % (ref 0.0–0.2)

## 2024-05-20 LAB — URINALYSIS, ROUTINE W REFLEX MICROSCOPIC
Bacteria, UA: NONE SEEN
Bilirubin Urine: NEGATIVE
Glucose, UA: NEGATIVE mg/dL
Hgb urine dipstick: NEGATIVE
Ketones, ur: 5 mg/dL — AB
Nitrite: NEGATIVE
Protein, ur: NEGATIVE mg/dL
Specific Gravity, Urine: 1.018 (ref 1.005–1.030)
WBC, UA: >50 WBC/hpf (ref 0–5)
pH: 6 (ref 5.0–8.0)

## 2024-05-20 LAB — COMPREHENSIVE METABOLIC PANEL WITH GFR
ALT: 32 U/L (ref 0–44)
AST: 29 U/L (ref 15–41)
Albumin: 3.9 g/dL (ref 3.5–5.0)
Alkaline Phosphatase: 50 U/L (ref 38–126)
Anion gap: 9 (ref 5–15)
BUN: 12 mg/dL (ref 6–20)
CO2: 31 mmol/L (ref 22–32)
Calcium: 9.5 mg/dL (ref 8.9–10.3)
Chloride: 96 mmol/L — ABNORMAL LOW (ref 98–111)
Creatinine, Ser: 0.85 mg/dL (ref 0.61–1.24)
GFR, Estimated: >60 mL/min
Glucose, Bld: 111 mg/dL — ABNORMAL HIGH (ref 70–99)
Potassium: 4.1 mmol/L (ref 3.5–5.1)
Sodium: 137 mmol/L (ref 135–145)
Total Bilirubin: 0.6 mg/dL (ref 0.0–1.2)
Total Protein: 6.1 g/dL — ABNORMAL LOW (ref 6.5–8.1)

## 2024-05-20 LAB — MAGNESIUM: Magnesium: 1.8 mg/dL (ref 1.7–2.4)

## 2024-05-20 MED ORDER — DIVALPROEX SODIUM 500 MG PO DR TAB
625.0000 mg | DELAYED_RELEASE_TABLET | Freq: Three times a day (TID) | ORAL | Status: DC
  Administered 2024-05-20 – 2024-06-08 (×57): 625 mg via ORAL
  Filled 2024-05-20 (×61): qty 1

## 2024-05-20 NOTE — Consult Note (Signed)
 WOC Nurse Consult Note: Reason for Consult: Pt sacrum healed. Can we dc wound care order or modify it?  Wound type: healed DC wound care.    Re consult if needed, will not follow at this time. Thanks  [employer], RN,CWOCN, CNS, [employer] [phone]

## 2024-05-20 NOTE — Progress Notes (Signed)
 [facility] Progress Note Patient: NHIA HEAPHY FMW:[id number] DOB: [birth year]  DOA: 03/02/2024 DOS: the patient was seen and examined on 05/20/2024  Brief Hospital Course: 59yo with h/o type 1 bipolar d/o, tardive dyskinesia, T2DM, frequent falls, and HLD who presented on 10/18 with AMS.  He was initially boarded in the ER and was eventually admitted for evaluation of encephalopathy on 10/24.  He is awaiting SNF placement.   10/12.  Patient has been living with a friend for the last 2 to 3 years, patient was having bizarre behavior so friend did not let him come back to the apartment. Patient was boarded in the emergency room since 10/12. 10/19, psychiatry consulted as patient was endorsing suicidal thoughts-IVC 10/22, psychiatry cleared for discharge. 10/24, patient was noted to be encephalopathic so admitted to the hospital.  Multiple investigations were negative. 11/4, MRI under sedation, transient hypotension and altered mental status-overnight in ICU. 11/5, clinically improved.  Sedation wore off.  Transferred back to MedSurg bed.   11/18.  Persistent hypertension as well as multiple comorbidities, ongoing encephalopathy and patient was made DNR with two-physician consent. APS was involved although declined guardianship somewhere in December 2025. Referrals were sent for SNF in December 2025 05/16/2024 assessment shows patient has capacity.  Patient wanted to be full code. 1/9 LFT normal.  Ammonia 35, Depakote  level 76.  Assessment and plan. Bipolar disorder. Acute delirium. Tardive dyskinesia. Acute metabolic encephalopathy. Presented with confusion due to dehydration. CT as well as MRI brain negative for any acute abnormality. EEG negative. TSH, B12 ammonia level and thiamine  normal. RPR nonreactive. Patient was initially confused and was lacking capacity but now has full capacity. Continue current regimen.   Capacity evaluation. Patient has full capacity based on  assessment on 05/16/2024. Able to tell me his name, where he is, why he is in the hospital and how he can seek help if he is in trouble and assistance. In the past APS has denied guardianship as the patient also had capacity at the time of their evaluation. Based on this I have asked him about his goals of care as well.   Chronic tremor. Chronically on Inderal  10 mg twice daily as well as benztropine  1 mg twice daily. Started back on Inderal .  Blood pressure soft therefore unable to uptitrate the medicine. Improvement with benztropine . Valproic acid  level and ammonia level within normal range. Monitor.   T2DM with hyperglycemia. Hemoglobin A1c 6.3. For now on metformin . Unsure utility of this medication but will monitor.   HTN. Blood pressure was soft initially. Currently on Inderal .  Monitor.   Goals of care conversation. Patient was deemed a DNR with 2 physician when he had lack of capacity due to his comorbidities. At present patient is alert awake and oriented x 4 and has capacity. Patient would like to be resuscitated. Patient would like his aunt [name] to be his HCPOA. Chaplain consulted for [facility] documentation. DNR discontinued and switched to full code. Working on placement.   Subjective: RN reported some concerns with regards to agitation.  Patient at the time of my evaluation denies any nausea vomiting agitation.  No abdominal pain.  No fever no chills.  Somewhat withdrawn though.  Physical Exam: Clear to auscultation. S1-S2 present present bowel sound present No edema. No rash.  Data Reviewed: I have Reviewed nursing notes, Vitals, and Lab results. Ordered CBC and CMP.  CBC and CMP both appears to be within normal range.  UA also ordered.  Disposition: Status is: Inpatient Remains  inpatient appropriate because: Monitor for placement.  enoxaparin  (LOVENOX ) injection 40 mg Start: 03/08/24 0800   Family Communication: No one at bedside Level of care: Med-Surg    Vitals:   05/19/24 2024 05/20/24 0010 05/20/24 0611 05/20/24 1417  BP: 115/75 109/69 123/68 (!) 98/41  Pulse: 71 61 (!) 57 64  Resp: 18 18 16 16   Temp:  97.6 F (36.4 C) 98.6 F (37 C) 97.6 F (36.4 C)  TempSrc:  Axillary Oral Oral  SpO2: 94% 95% 99% 95%  Weight:      Height:         Author: Yetta Blanch, MD 05/20/2024 4:19 PM  Please look on [email or web address] to find out who is on call.

## 2024-05-20 NOTE — Plan of Care (Signed)

## 2024-05-21 LAB — GLUCOSE, CAPILLARY: Glucose-Capillary: 100 mg/dL — ABNORMAL HIGH (ref 70–99)

## 2024-05-21 MED ORDER — BENZTROPINE MESYLATE 0.5 MG PO TABS
0.5000 mg | ORAL_TABLET | Freq: Every day | ORAL | Status: DC
  Administered 2024-05-21 – 2024-06-08 (×19): 0.5 mg via ORAL
  Filled 2024-05-21 (×20): qty 1

## 2024-05-21 NOTE — TOC Progression Note (Addendum)
 Transition of Care [facility]) - Progression Note    Patient Details  Name: [name] [name] MRN: [id number] Date of Birth: February 01, 1966  Transition of Care [facility]) CM/SW Contact  Doneta Glenys DASEN, RN Phone Number: 05/21/2024, 9:16 AM  Clinical Narrative:   4:34 PM Additional referrals faxed   11:11 AM Ronal stated that [facility] is not in network with [employer] or [facility] Medicaid. 9:16 am CM called [facility] nurse liaison Ronal Fret at [phone] - Left Message.       Barriers to Discharge: SNF Pending bed offer               Expected Discharge Plan and Services                                               Social Drivers of Health (SDOH) Interventions SDOH Screenings   Food Insecurity: Patient Unable To Answer (03/07/2024)  Housing: Unknown (03/07/2024)  Transportation Needs: Patient Unable To Answer (03/07/2024)  Utilities: Patient Unable To Answer (03/07/2024)  Depression (PHQ2-9): High Risk (01/26/2024)  Social Connections: Unknown (09/22/2021)   Received from [facility]  Tobacco Use: Low Risk (03/13/2024)    Readmission Risk Interventions     No data to display

## 2024-05-21 NOTE — Progress Notes (Signed)
 [facility] PROGRESS NOTE Patient: IMMANUEL FEDAK FMW:[id number]   DOA: 03/02/2024   DOS: 05/21/2024   Subjective: No nausea or vomiting no fever no chills.  Somewhat more tired today.  Objective:  Vitals:   05/20/24 2027 05/21/24 0456 05/21/24 0806 05/21/24 1226  BP: 112/74 98/61 115/67 103/65  Pulse: 70 60 (!) 56 62  Resp:  16 16 18   Temp: 97.7 F (36.5 C) 98.1 F (36.7 C) 97.6 F (36.4 C) 97.9 F (36.6 C)  TempSrc: Oral Oral Oral Oral  SpO2: 96% 95% 94% 97%  Weight:      Height:      Clear to auscultation. S1-S2 present. Pupils are equal and reactive to light. No focal deficit.  Data: Eating 100% of meals.  CBG 100. Assessment and plan: Remains medically stable. Awaiting placement.  Author: Yetta Blanch, MD [facility] 05/21/2024 6:34 PM  Between 7PM-7AM, please contact night-coverage listed at [email or web address]

## 2024-05-21 NOTE — Plan of Care (Signed)
  Problem: Health Behavior/Discharge Planning: Goal: Ability to manage health-related needs will improve Outcome: Progressing   Problem: Clinical Measurements: Goal: Will remain free from infection Outcome: Progressing   Problem: Clinical Measurements: Goal: Diagnostic test results will improve Outcome: Progressing   

## 2024-05-22 DIAGNOSIS — G9340 Encephalopathy, unspecified: Secondary | ICD-10-CM | POA: Diagnosis not present

## 2024-05-22 NOTE — Progress Notes (Signed)
 Physical Therapy Discharge Patient Details Name: [name] [name] MRN: [id number] DOB: 1965/06/16 Today's Date: 05/22/2024 Time:  -     Patient discharged from PT services secondary to goals met and no further PT needs identified.maximum benefit  of acute PT. Patient ambulatory with staf, no further skilled PT needs.   Please see latest therapy progress note for current level of functioning and progress toward goals.    Progress and discharge plan discussed with patient and/or caregiver: Patient unable to participate in discharge planning and no caregivers available [name] PT Acute Rehabilitation Services Office [phone]  GP     Potters [name] 05/22/2024, 12:06 PM

## 2024-05-22 NOTE — TOC Progression Note (Addendum)
 Transition of Care [facility]) - Progression Note    Patient Details  Name: [name] [name] MRN: [id number] Date of Birth: 1965/06/12  [facility]) CM/SW Contact  Doneta Glenys DASEN, RN Phone Number: 05/22/2024, 8:41 AM  Clinical Narrative:    9:18 AM CM spoke with Sari - [phone] and she instructed me to email referral documents to Tobias Sar [phone]) at the [facility] . Email: [email or web address] 8:41 AM [facility] Case Manager Alimah B. [phone] ext. [phone] called to offer assistance with discharge planning.     Barriers to Discharge: SNF Pending bed offer               Expected Discharge Plan and Services                                               Social Drivers of Health (SDOH) Interventions SDOH Screenings   Food Insecurity: Patient Unable To Answer (03/07/2024)  Housing: Unknown (03/07/2024)  Transportation Needs: Patient Unable To Answer (03/07/2024)  Utilities: Patient Unable To Answer (03/07/2024)  Depression (PHQ2-9): High Risk (01/26/2024)  Social Connections: Unknown (09/22/2021)   Received from [facility]  Tobacco Use: Low Risk (03/13/2024)    Readmission Risk Interventions     No data to display

## 2024-05-22 NOTE — Plan of Care (Signed)
   Problem: Health Behavior/Discharge Planning: Goal: Ability to manage health-related needs will improve Outcome: Progressing   Problem: Clinical Measurements: Goal: Ability to maintain clinical measurements within normal limits will improve Outcome: Progressing Goal: Will remain free from infection Outcome: Progressing Goal: Diagnostic test results will improve Outcome: Progressing Goal: Respiratory complications will improve Outcome: Progressing

## 2024-05-22 NOTE — Progress Notes (Signed)
 [facility] Progress Note Patient: [name] [name] FMW:[id number] DOB: 1965/09/23  DOA: 03/02/2024 DOS: the patient was seen and examined on 05/22/2024  Brief Hospital Course: 58yo with h/o type 1 bipolar d/o, tardive dyskinesia, T2DM, frequent falls, and HLD who presented on 10/18 with AMS.  He was initially boarded in the ER and was eventually admitted for evaluation of encephalopathy on 10/24.  He is awaiting SNF placement.   10/12.  Patient has been living with a friend for the last 2 to 3 years, patient was having bizarre behavior so friend did not let him come back to the apartment. Patient was boarded in the emergency room since 10/12. 10/19, psychiatry consulted as patient was endorsing suicidal thoughts-IVC 10/22, psychiatry cleared for discharge. 10/24, patient was noted to be encephalopathic so admitted to the hospital.  Multiple investigations were negative. 11/4, MRI under sedation, transient hypotension and altered mental status-overnight in ICU. 11/5, clinically improved.  Sedation wore off.  Transferred back to MedSurg bed.   11/18.  Persistent hypertension as well as multiple comorbidities, ongoing encephalopathy and patient was made DNR with two-physician consent. APS was involved although declined guardianship somewhere in December 2025. Referrals were sent for SNF in December 2025 05/16/2024 assessment shows patient has capacity.  Patient wanted to be full code. 1/8 started on Cogentin  1 mg twice daily 1/9 LFT normal.  Ammonia 35, Depakote  level 76. 1/12 Cogentin  dose reduced to 0.5 mg twice daily due to excessive drowsiness.  Assessment and plan. Bipolar disorder. Acute delirium. Tardive dyskinesia. Acute metabolic encephalopathy. Presented with confusion due to dehydration. CT as well as MRI brain negative for any acute abnormality. EEG negative. TSH, B12 ammonia level and thiamine  normal. RPR nonreactive. Patient was initially confused and was lacking capacity  but now has full capacity. Continue current regimen.   Capacity evaluation. Patient has full capacity based on assessment on 05/16/2024. Able to tell me his name, where he is, why he is in the hospital and how he can seek help if he is in trouble and assistance. In the past APS has denied guardianship as the patient also had capacity at the time of their evaluation. Based on this I have asked him about his goals of care as well.   Chronic tremor. Chronically on Inderal  10 mg twice daily as well as benztropine  1 mg twice daily. Started back on Inderal .  Blood pressure soft therefore unable to uptitrate the medicine. Improvement with benztropine .  Was significantly drowsy on home dose of 1 mg twice daily.  Mentation improved after cutting the dose down to half 0.5 mg twice daily. Valproic acid  level and ammonia level within normal range. Monitor.   T2DM with hyperglycemia. Hemoglobin A1c 6.3. For now on metformin . Unsure utility of this medication but will monitor.   HTN. Blood pressure was soft initially. Currently on Inderal .  Monitor.   Goals of care conversation. Patient was deemed a DNR with 2 physician when he had lack of capacity due to his comorbidities. At present patient is alert awake and oriented x 4 and has capacity. Patient would like to be resuscitated. Patient would like his aunt [name] to be his HCPOA. Chaplain consulted for [facility] documentation. DNR discontinued and switched to full code. Working on placement.   Subjective: No nausea no vomiting.  Mentation improving.  No fever no chills.  Oral intake adequate.  Tremor appears to have increased.  Physical Exam: Clear to auscultation. S1-S2 present.  Data Reviewed: I have Reviewed nursing notes, Vitals, and  Lab results.   Disposition: Status is: Inpatient Remains inpatient appropriate because: Medically stable.  Awaiting placement.  enoxaparin  (LOVENOX ) injection 40 mg Start: 03/08/24 0800   Family  Communication: No one at bedside. Level of care: Med-Surg   Vitals:   05/21/24 1935 05/22/24 0451 05/22/24 0623 05/22/24 1226  BP: 100/67 (!) 81/66 106/76 121/77  Pulse: 70 (!) 55 (!) 54 64  Resp: 18 15  18   Temp: 97.7 F (36.5 C) 97.8 F (36.6 C)  98 F (36.7 C)  TempSrc:    Oral  SpO2: 95% 96% 93% 98%  Weight:      Height:         Author: Yetta Blanch, MD 05/22/2024 6:30 PM  Please look on [email or web address] to find out who is on call.

## 2024-05-22 NOTE — Plan of Care (Signed)

## 2024-05-22 NOTE — Progress Notes (Signed)
 Mobility Specialist Progress Note:   05/22/24 1605  Mobility  Activity Ambulated with assistance  Level of Assistance Minimal assist, patient does 75% or more  Assistive Device Front wheel walker  Distance Ambulated (ft) 15 ft  Activity Response Tolerated well  Mobility Referral Yes  Mobility visit 1 Mobility  Mobility Specialist Start Time (ACUTE ONLY) 1530  Mobility Specialist Stop Time (ACUTE ONLY) 1544  Mobility Specialist Time Calculation (min) (ACUTE ONLY) 14 min   Pt was received in bed and requested to use restroom. 1x bout of unsteadiness during ambulation, but recovered quickly. Returned to bed with all needs met. Call bell in reach and bed alarm on.  [employer] - Mobility Specialist

## 2024-05-23 DIAGNOSIS — G9340 Encephalopathy, unspecified: Secondary | ICD-10-CM | POA: Diagnosis not present

## 2024-05-23 MED ORDER — DICLOFENAC SODIUM 1 % EX GEL
2.0000 g | Freq: Four times a day (QID) | CUTANEOUS | Status: DC
  Administered 2024-05-23: 2 g via TOPICAL
  Filled 2024-05-23: qty 100

## 2024-05-23 NOTE — Progress Notes (Addendum)
 Occupational Therapy Treatment and Discharge  Patient Details Name: [name] [name] MRN: [id number] DOB: 1965/05/27 Today's Date: 05/23/2024   History of present illness Pt is a 59 yr old male presenting on 10/24 for fall and chronic back pain. Pt was also noted to be with change in mental status due to acute delirium, and admitted from ED with acute metabolic encephalopathy.  PMH includes: bipolar disorder type 1, tardive dyskinesia, PTSD, MDD, CAD, DM2, OA, tremors.   OT comments  The pt was seen for ADL instruction and progression of functional activity. He required min assist for supine to sit. moderate assistance for donning a hospital gown seated EOB and max assist for sock management seated EOB. He presented with posterior lean in sitting, requiring cues and assist to correct. Overall, he presented with good effort and participation. The patient has received OT services throughout his hospital stay, with the initial OT evaluation being completed on 03-01-24. He has demonstrated functional progress in some areas & has subsequently demonstrated a functional plateau with regards to ADL performance. At current, he does continue to require some assistance for tasks such as, lower body dressing, toileting, and bathing.  He does not require further OT services in the acute care setting. OT recommends out of bed daily to the chair with nursing staff and for the mobility team to continue working with the pt while he's hospitalized. OT will sign off and recommend SNF rehab vs. consideration of long-term ALF at discharge.       If plan is discharge home, recommend the following:  Assistance with cooking/housework;Direct supervision/assist for medications management;Direct supervision/assist for financial management;Assist for transportation;Help with stairs or ramp for entrance;A lot of help with walking and/or transfers;A lot of help with bathing/dressing/bathroom   Equipment Recommendations  Other  (comment) (defer to next setting)    Recommendations for Other Services      Precautions / Restrictions Precautions Precautions: Fall Restrictions Other Position/Activity Restrictions: chronic back pain, history of Parkinson's disease       Mobility Bed Mobility Overal bed mobility: Needs Assistance Bed Mobility: Sit to Supine, Supine to Sit     Supine to sit: Min assist, HOB elevated, Used rails Sit to supine: Min assist           Balance     Sitting balance-Leahy Scale: Poor Sitting balance - Comments: posterior leaning noted with the pt requiring min to mod assist to correct          ADL either performed or assessed with clinical judgement   ADL Overall ADL's : Needs assistance/impaired Eating/Feeding: Set up;Supervision/ safety Eating/Feeding Details (indicate cue type and reason): Pt requires assist to open packets, remove lids, and cutting foods, due to BUE shakiness/fine motor coordination deficits. Pt was observed to drink from a cup at bed level after set-up assist was provided             Upper Body Dressing : Moderate assistance;Sitting;Cueing for sequencing Upper Body Dressing Details (indicate cue type and reason): Pt required assist to donn a hospital gown seated EOB. He presented posterior leaning, removing cues and assist to correct. Lower Body Dressing: Sitting/lateral leans;Maximal assistance Lower Body Dressing Details (indicate cue type and reason): Pt was unable to flex at the hips in order to reach his feet to donn/doff socks. Pt also had difficulty implementing the figure 4 technique to perform, as he was limited by impaired sitting balance.  Vision Baseline Vision/History: 1 Wears glasses               Cognition Arousal: Alert Behavior During Therapy: Flat affect Cognition: Cognition impaired   Orientation impairments: Situation Awareness: Online awareness impaired Memory impairment (select all  impairments): Short-term memory, Working civil service fast streamer, Conservation officer, historic buildings Attention impairment (select first level of impairment): Divided attention Executive functioning impairment (select all impairments): Reasoning, Problem solving                   Following commands: Impaired Following commands impaired: Follows one step commands with increased time      Cueing   Cueing Techniques: Verbal cues, Gestural cues             Pertinent Vitals/ Pain       Pain Assessment Pain Assessment: 0-10 Pain Score: 5  Pain Location: L knee Pain Intervention(s): Limited activity within patient's tolerance, Monitored during session, Repositioned   Frequency  Other (comment) (N/A)        Progress Toward Goals  OT Goals(current goals can now be found in the care plan section)  Progress towards OT goals: Goals met/education completed, patient discharged from OT  Acute Rehab OT Goals Patient Stated Goal: to return home OT Goal Formulation: All assessment and education complete, DC therapy ADL Goals Pt Will Perform Grooming: with min assist;bed level Pt Will Perform Lower Body Dressing: with max assist Pt Will Transfer to Toilet: with mod assist;bedside commode Pt Will Perform Toileting - Clothing Manipulation and hygiene: with mod assist;sit to/from stand  Plan         AM-PAC OT 6 Clicks Daily Activity     Outcome Measure   Help from another person eating meals?: A Little Help from another person taking care of personal grooming?: A Little Help from another person toileting, which includes using toliet, bedpan, or urinal?: A Lot Help from another person bathing (including washing, rinsing, drying)?: A Lot Help from another person to put on and taking off regular upper body clothing?: A Little Help from another person to put on and taking off regular lower body clothing?: A Lot 6 Click Score: 15    End of Session Equipment Utilized During Treatment: Other (comment)  (N/A)  OT Visit Diagnosis: Other abnormalities of gait and mobility (R26.89);Unsteadiness on feet (R26.81);Muscle weakness (generalized) (M62.81);History of falling (Z91.81);Other symptoms and signs involving cognitive function   Activity Tolerance Patient tolerated treatment well   Patient Left in bed;with call bell/phone within reach;with bed alarm set   Nurse Communication Mobility status        Time: 1259-1315 OT Time Calculation (min): 16 min  Charges: OT General Charges $OT Visit: 1 Visit OT Treatments $Self Care/Home Management : 8-22 mins    [name], OTR/L 05/23/2024, 3:23 PM

## 2024-05-23 NOTE — Progress Notes (Signed)
 Pt received Voltaren  gel to right knee. Increased swelling and pain was assessed. MD notified. Ice pack applied. Decreased redness and pain after ice pack. Pt stated pain improved. Pt denies pain. No acute distress. Plan of care continued.

## 2024-05-23 NOTE — Progress Notes (Addendum)
 " [facility] Progress Note  Patient: [name] [name]     FMW:[id number]  DOA: 03/02/2024   PCP: [name], [name]       Brief hospital course: [phone] with h/o type 1 bipolar d/o, tardive dyskinesia, T2DM, frequent falls, and HLD who presented on 10/18 with AMS.  He was initially boarded in the ER and was eventually admitted for evaluation of encephalopathy on 10/24.  He is awaiting SNF placement.   10/12.  Patient has been living with a friend for the last 2 to 3 years, patient was having bizarre behavior so friend did not let him come back to the apartment. Patient was boarded in the emergency room since 10/12. 10/19, psychiatry consulted as patient was endorsing suicidal thoughts-IVC 10/22, psychiatry cleared for discharge. 10/24, patient was noted to be encephalopathic so admitted to the hospital.  Multiple investigations were negative. 11/4, MRI under sedation, transient hypotension and altered mental status-overnight in ICU. 11/5, clinically improved.  Sedation wore off.  Transferred back to MedSurg bed.   11/18.  Persistent hypertension as well as multiple comorbidities, ongoing encephalopathy and patient was made DNR with two-physician consent. APS was involved although declined guardianship somewhere in December 2025. Referrals were sent for SNF in December 2025 05/16/2024 assessment shows patient has capacity.  Patient wanted to be full code. 1/8 started on Cogentin  1 mg twice daily 1/9 LFT normal.  Ammonia 35, Depakote  level 76. 1/12 Cogentin  dose reduced to 0.5 mg twice daily due to excessive drowsiness.    Subjective:  States that he has had pain in his right knee for about 2 weeks now.  It is worse when he ambulates.  Assessment and Plan: Principal Problem:   Acute metabolic encephalopathy, bipolar disorder, tardive dyskinesia - CT and MRI brain negative - EEG negative - TSH, B12, ammonia and thiamine  levels normal - RPR nonreactive - Etiology for delirium  undetermined - Patient is now much more oriented and has capacity - Currently receiving benztropine , Depakote , Cymbalta , Ingrezza  and as needed Haldol   Active Problems: Right knee pain - She states has been going on for about 2 weeks and is limiting his ambulation - Voltaren  ordered however per nurse, patient complained of increasing pain and she noted increasing erythema - Will try ice packs     Type 2 diabetes mellitus with hyperglycemia, without long-term current use of insulin  - Continue metformin     Essential hypertension - Continue Inderal   Chronic tremor of arms and hands - Continue   Insomnia - Trazodone   Disposition: Waiting on SNF    Code Status: Full Code Total time on patient care: 35 minutes DVT prophylaxis:  enoxaparin  (LOVENOX ) injection 40 mg Start: 03/08/24 0800     Objective:   Vitals:   05/22/24 1935 05/23/24 0523 05/23/24 0909 05/23/24 1317  BP: 98/65 103/68 (!) 111/59 104/67  Pulse: 68 (!) 57 70 64  Resp: 16 18  19   Temp: 98.3 F (36.8 C) (!) 97.5 F (36.4 C)  (!) 97.5 F (36.4 C)  TempSrc: Oral Oral    SpO2: 100% 93% 95% 95%  Weight:      Height:       Filed Weights   03/07/24 2229 03/26/24 1715  Weight: 87.4 kg 83 kg   Exam: General exam: Appears comfortable  HEENT: oral mucosa moist Respiratory system: Clear to auscultation.  Cardiovascular system: S1 & S2 heard  Gastrointestinal system: Abdomen soft, non-tender, nondistended. Normal bowel sounds   Extremities: No cyanosis, clubbing or edema Musculoskeletal:  Tenderness in the superior aspect of the right knee-no edema-has mild erythema Psychiatry:  Mood & affect appropriate.      CBC: Recent Labs  Lab 05/20/24 1038  WBC 8.4  NEUTROABS 4.3  HGB 16.5  HCT 47.1  MCV 85.5  PLT 208   Basic Metabolic Panel: Recent Labs  Lab 05/20/24 1350  NA 137  K 4.1  CL 96*  CO2 31  GLUCOSE 111*  BUN 12  CREATININE 0.85  CALCIUM  9.5  MG 1.8     Scheduled Meds:  atorvastatin    20 mg Oral Daily   benztropine   0.5 mg Oral Daily   divalproex   625 mg Oral Q8H   DULoxetine   60 mg Oral BID   enoxaparin  (LOVENOX ) injection  40 mg Subcutaneous Q24H   hydrOXYzine   50 mg Oral QHS   multivitamin with minerals  1 tablet Oral Daily   propranolol   5 mg Oral BID   valbenazine   80 mg Oral Daily    Imaging and lab data personally reviewed   Author: Eythan Jayne  05/23/2024 5:01 PM  To contact [facility]>   Check the care team in [facility] and look for the attending/consulting TRH provider listed  Log into [email or web address] and use [place]'s universal password   Go to> [facility]  and find provider  If you still have difficulty reaching the provider, please page the [facility] (Director on Call) for the Hospitalists listed on amion     "

## 2024-05-23 NOTE — Plan of Care (Signed)

## 2024-05-23 NOTE — TOC Progression Note (Signed)
 Transition of Care [facility]) - Progression Note    Patient Details  Name: [name] [name] MRN: [id number] Date of Birth: 29-Jul-1965  [facility]) CM/SW Contact  Doneta Glenys DASEN, RN Phone Number: 05/23/2024, 10:10 AM  Clinical Narrative:    CM called Tobias Sar [facility] [phone] able to leave message. CM called Sari [phone] - left message    Barriers to Discharge: SNF Pending bed offer               Expected Discharge Plan and Services                                               Social Drivers of Health (SDOH) Interventions SDOH Screenings   Food Insecurity: Patient Unable To Answer (03/07/2024)  Housing: Unknown (03/07/2024)  Transportation Needs: Patient Unable To Answer (03/07/2024)  Utilities: Patient Unable To Answer (03/07/2024)  Depression (PHQ2-9): High Risk (01/26/2024)  Social Connections: Unknown (09/22/2021)   Received from [facility]  Tobacco Use: Low Risk (03/13/2024)    Readmission Risk Interventions     No data to display

## 2024-05-24 DIAGNOSIS — G9340 Encephalopathy, unspecified: Secondary | ICD-10-CM | POA: Diagnosis not present

## 2024-05-24 NOTE — Plan of Care (Signed)

## 2024-05-24 NOTE — TOC Progression Note (Addendum)
 Transition of Care [facility]) - Progression Note    Patient Details  Name: [name] [name] MRN: [id number] Date of Birth: 02-22-1966  Transition of Care [facility]) CM/SW Contact  Doneta Glenys DASEN, RN Phone Number: 05/24/2024, 9:50 AM  Clinical Narrative:  CM sent referral to [place] [facility]admission coordinator for [facility]. Brittany will try to see if there are any of the SNF/AL locations can accept patient. 9:50 AM   CM called Tobias Sar [facility] [phone] beds available. [phone] - left message      Barriers to Discharge: SNF Pending bed offer               Expected Discharge Plan and Services                                               Social Drivers of Health (SDOH) Interventions SDOH Screenings   Food Insecurity: Patient Unable To Answer (03/07/2024)  Housing: Unknown (03/07/2024)  Transportation Needs: Patient Unable To Answer (03/07/2024)  Utilities: Patient Unable To Answer (03/07/2024)  Depression (PHQ2-9): High Risk (01/26/2024)  Social Connections: Unknown (09/22/2021)   Received from [facility]  Tobacco Use: Low Risk (03/13/2024)    Readmission Risk Interventions     No data to display

## 2024-05-24 NOTE — Progress Notes (Signed)
 " [facility] Progress Note  Patient: [name] [name]     FMW:[id number]  DOA: 03/02/2024   PCP: [name], [name]       Brief hospital course: [phone] with h/o type 1 bipolar d/o, tardive dyskinesia, T2DM, frequent falls, and HLD who presented on 10/18 with AMS.  He was initially boarded in the ER and was eventually admitted for evaluation of encephalopathy on 10/24.  He is awaiting SNF placement.   10/12.  Patient has been living with a friend for the last 2 to 3 years, patient was having bizarre behavior so friend did not let him come back to the apartment. Patient was boarded in the emergency room since 10/12. 10/19, psychiatry consulted as patient was endorsing suicidal thoughts-IVC 10/22, psychiatry cleared for discharge. 10/24, patient was noted to be encephalopathic so admitted to the hospital.  Multiple investigations were negative. 11/4, MRI under sedation, transient hypotension and altered mental status-overnight in ICU. 11/5, clinically improved.  Sedation wore off.  Transferred back to MedSurg bed.   11/18.  Persistent hypertension as well as multiple comorbidities, ongoing encephalopathy and patient was made DNR with two-physician consent. APS was involved although declined guardianship somewhere in December 2025. Referrals were sent for SNF in December 2025 05/16/2024 assessment shows patient has capacity.  Patient wanted to be full code. 1/8 started on Cogentin  1 mg twice daily 1/9 LFT normal.  Ammonia 35, Depakote  level 76. 1/12 Cogentin  dose reduced to 0.5 mg twice daily due to excessive drowsiness.    Subjective:  He states pain in right knee feels better after tylenol . No new complaints.   Assessment and Plan: Principal Problem:   Acute metabolic encephalopathy, bipolar disorder, tardive dyskinesia - CT and MRI brain negative - EEG negative - TSH, B12, ammonia and thiamine  levels normal - RPR nonreactive - Etiology for delirium undetermined - Patient is  now much more oriented and has capacity - Currently receiving benztropine , Depakote , Cymbalta , Ingrezza  and as needed Haldol   Active Problems: Right knee pain - She states has been going on for about 2 weeks and is limiting his ambulation - Voltaren  ordered however per nurse, patient complained of increasing pain and she noted increasing erythema - cont Tylenol  and PRN ice packs    Type 2 diabetes mellitus with hyperglycemia, without long-term current use of insulin  - Continue metformin     Essential hypertension - Continue Inderal   Chronic tremor of arms and hands - Continue   Insomnia - Trazodone   Disposition: Waiting on SNF    Code Status: Full Code Total time on patient care: 35 minutes DVT prophylaxis:  enoxaparin  (LOVENOX ) injection 40 mg Start: 03/08/24 0800     Objective:   Vitals:   05/23/24 2019 05/24/24 0548 05/24/24 0906 05/24/24 1340  BP: 108/78 106/60  (!) 167/153  Pulse: 71 (!) 59 75 64  Resp:    18  Temp: 97.8 F (36.6 C) 98.2 F (36.8 C)  (!) 97.3 F (36.3 C)  TempSrc: Oral Oral    SpO2: 94% 94%  94%  Weight:      Height:       Filed Weights   03/07/24 2229 03/26/24 1715  Weight: 87.4 kg 83 kg   Exam: General exam: Appears comfortable  HEENT: oral mucosa moist Respiratory system: Clear to auscultation.  Cardiovascular system: S1 & S2 heard  Gastrointestinal system: Abdomen soft, non-tender, nondistended. Normal bowel sounds   Extremities: No cyanosis, clubbing or edema Musculoskeletal: Tenderness in the superior aspect of the right  knee-no edema-  Psychiatry:  Mood & affect appropriate.      CBC: Recent Labs  Lab 05/20/24 1038  WBC 8.4  NEUTROABS 4.3  HGB 16.5  HCT 47.1  MCV 85.5  PLT 208   Basic Metabolic Panel: Recent Labs  Lab 05/20/24 1350  NA 137  K 4.1  CL 96*  CO2 31  GLUCOSE 111*  BUN 12  CREATININE 0.85  CALCIUM  9.5  MG 1.8     Scheduled Meds:  atorvastatin   20 mg Oral Daily   benztropine   0.5 mg Oral  Daily   divalproex   625 mg Oral Q8H   DULoxetine   60 mg Oral BID   enoxaparin  (LOVENOX ) injection  40 mg Subcutaneous Q24H   hydrOXYzine   50 mg Oral QHS   multivitamin with minerals  1 tablet Oral Daily   propranolol   5 mg Oral BID   valbenazine   80 mg Oral Daily    Imaging and lab data personally reviewed   Author: Shevy Yaney  05/24/2024 2:03 PM  To contact [facility]>   Check the care team in [facility] and look for the attending/consulting TRH provider listed  Log into [email or web address] and use [place]'s universal password   Go to> [facility]  and find provider  If you still have difficulty reaching the provider, please page the [facility] (Director on Call) for the Hospitalists listed on amion     "

## 2024-05-25 DIAGNOSIS — G9340 Encephalopathy, unspecified: Secondary | ICD-10-CM | POA: Diagnosis not present

## 2024-05-25 MED ORDER — TUBERCULIN PPD 5 UNIT/0.1ML ID SOLN
5.0000 [IU] | Freq: Once | INTRADERMAL | Status: AC
  Administered 2024-05-25: 5 [IU] via INTRADERMAL
  Filled 2024-05-25: qty 0.1

## 2024-05-25 NOTE — TOC Progression Note (Addendum)
 Transition of Care [facility]) - Progression Note    Patient Details  Name: [name] [name] MRN: [id number] Date of Birth: 03/02/1966  [facility]) CM/SW Contact  Doneta Glenys DASEN, RN Phone Number: 05/25/2024, 12:09 PM  Clinical Narrative:    3:27 PM CM requested Dr. Earley place an order for a TB skin test. An Admission Coordinator with [facility] is coming tomorrow to evaluate patient for there facility. If accepted must have a current TB test.  2:44 PM CM called  [facility] Terris Gosling Social Worker [phone] left message concerning assisting patient with disability and insurance coverage.insurance 12:09 PM CM rec'd call form [employer] case manager with information that if patient is agreeable to changing plan to a tailored plan this would open up addition option for housing. CM will call [facility] SW.    Barriers to Discharge: SNF Pending bed offer               Expected Discharge Plan and Services                                               Social Drivers of Health (SDOH) Interventions SDOH Screenings   Food Insecurity: Patient Unable To Answer (03/07/2024)  Housing: Unknown (03/07/2024)  Transportation Needs: Patient Unable To Answer (03/07/2024)  Utilities: Patient Unable To Answer (03/07/2024)  Depression (PHQ2-9): High Risk (01/26/2024)  Social Connections: Unknown (09/22/2021)   Received from [facility]  Tobacco Use: Low Risk (03/13/2024)    Readmission Risk Interventions     No data to display

## 2024-05-25 NOTE — Progress Notes (Signed)
 Mobility Specialist Progress Note:   05/25/24 1528  Mobility  Activity Ambulated with assistance  Level of Assistance Minimal assist, patient does 75% or more  Assistive Device Front wheel walker  Distance Ambulated (ft) 260 ft  Activity Response Tolerated well  Mobility Referral Yes  Mobility visit 1 Mobility  Mobility Specialist Start Time (ACUTE ONLY) 1511  Mobility Specialist Stop Time (ACUTE ONLY) 1521  Mobility Specialist Time Calculation (min) (ACUTE ONLY) 10 min   Pt was received in bed and agreed to mobility. Min A sit to stand. Pt c/o sharp knee pain, continued ambulation. Returned to bed with all needs met. Call bell in reach and bed alarm on.  [employer] - Mobility Specialist

## 2024-05-25 NOTE — Plan of Care (Signed)
 I gave patient a TB test in his left forearm, will need to be read in 48 hours.

## 2024-05-25 NOTE — Plan of Care (Signed)
  Problem: Clinical Measurements: Goal: Ability to maintain clinical measurements within normal limits will improve Outcome: Progressing Goal: Diagnostic test results will improve Outcome: Progressing Goal: Respiratory complications will improve Outcome: Progressing Goal: Cardiovascular complication will be avoided Outcome: Progressing   Problem: Coping: Goal: Level of anxiety will decrease Outcome: Progressing   Problem: Safety: Goal: Ability to remain free from injury will improve Outcome: Progressing

## 2024-05-25 NOTE — Progress Notes (Signed)
 ICM Supervisor and ICM Director reached out to [employer] [phone]) from [facility] regarding placement for [name] will do a meet and greet with pt Saturday 1/17 and let ICM Director know the outcome. ICM Supervisor informed NCM and NCM will have TB skin test ordered through MD. ICM to follow

## 2024-05-25 NOTE — Progress Notes (Addendum)
 " [facility] Progress Note  Patient: [name] [name]     FMW:[id number]  DOA: 03/02/2024   PCP: [name], [name]       Brief hospital course: [phone] with h/o type 1 bipolar d/o, tardive dyskinesia, T2DM, frequent falls, and HLD who presented on 10/18 with AMS.  He was initially boarded in the ER and was eventually admitted for evaluation of encephalopathy on 10/24.  He is awaiting SNF placement.   10/12.  Patient has been living with a friend for the last 2 to 3 years, patient was having bizarre behavior so friend did not let him come back to the apartment. Patient was boarded in the emergency room since 10/12. 10/19, psychiatry consulted as patient was endorsing suicidal thoughts-IVC 10/22, psychiatry cleared for discharge. 10/24, patient was noted to be encephalopathic so admitted to the hospital.  Multiple investigations were negative. 11/4, MRI under sedation, transient hypotension and altered mental status-overnight in ICU. 11/5, clinically improved.  Sedation wore off.  Transferred back to MedSurg bed.   11/18.  Persistent hypertension as well as multiple comorbidities, ongoing encephalopathy and patient was made DNR with two-physician consent. APS was involved although declined guardianship somewhere in December 2025. Referrals were sent for SNF in December 2025 05/16/2024 assessment shows patient has capacity.  Patient wanted to be full code. 1/8 started on Cogentin  1 mg twice daily 1/9 LFT normal.  Ammonia 35, Depakote  level 76. 1/12 Cogentin  dose reduced to 0.5 mg twice daily due to excessive drowsiness.    Subjective:  He has no complaints today.   Assessment and Plan: Principal Problem:   Acute metabolic encephalopathy, bipolar disorder, tardive dyskinesia - CT and MRI brain negative - EEG negative - TSH, B12, ammonia and thiamine  levels normal - RPR nonreactive - Etiology for delirium undetermined - Patient is now much more oriented and has capacity -  Currently receiving benztropine , Depakote , Cymbalta , Ingrezza  and as needed Haldol  which he is not needing   Active Problems: Right knee pain- 1/14 - She states has been going on for about 2 weeks and is limiting his ambulation - Voltaren  ordered however per nurse, patient complained of increasing pain and she noted increasing erythema - resolved    Type 2 diabetes mellitus with hyperglycemia, without long-term current use of insulin  - Continue metformin     Essential hypertension - Continue Inderal   Chronic tremor of arms and hands   Insomnia - Trazodone   Disposition: Waiting on SNF    Code Status: Full Code Total time on patient care: 35 minutes DVT prophylaxis:  enoxaparin  (LOVENOX ) injection 40 mg Start: 03/08/24 0800     Objective:   Vitals:   05/24/24 1929 05/25/24 0503 05/25/24 1027 05/25/24 1321  BP: 122/73 110/89  120/75  Pulse: (!) 56 (!) 56 (!) 56 72  Resp: 18 16  18   Temp: (!) 97.5 F (36.4 C) (!) 97.4 F (36.3 C)  98.2 F (36.8 C)  TempSrc: Oral Oral  Oral  SpO2: 95% 96%  94%  Weight:      Height:       Filed Weights   03/07/24 2229 03/26/24 1715  Weight: 87.4 kg 83 kg   Exam: General exam: Appears comfortable  HEENT: oral mucosa moist Respiratory system: Clear to auscultation.  Cardiovascular system: S1 & S2 heard  Gastrointestinal system: Abdomen soft, non-tender, nondistended. Normal bowel sounds   Extremities: No cyanosis, clubbing or edema MSK no tenderness in knees Psychiatry:  Mood & affect appropriate.  CBC: Recent Labs  Lab 05/20/24 1038  WBC 8.4  NEUTROABS 4.3  HGB 16.5  HCT 47.1  MCV 85.5  PLT 208   Basic Metabolic Panel: Recent Labs  Lab 05/20/24 1350  NA 137  K 4.1  CL 96*  CO2 31  GLUCOSE 111*  BUN 12  CREATININE 0.85  CALCIUM  9.5  MG 1.8     Scheduled Meds:  atorvastatin   20 mg Oral Daily   benztropine   0.5 mg Oral Daily   divalproex   625 mg Oral Q8H   DULoxetine   60 mg Oral BID   enoxaparin   (LOVENOX ) injection  40 mg Subcutaneous Q24H   hydrOXYzine   50 mg Oral QHS   multivitamin with minerals  1 tablet Oral Daily   propranolol   5 mg Oral BID   valbenazine   80 mg Oral Daily    Imaging and lab data personally reviewed   Author: Jonetta Dagley  05/25/2024 1:48 PM  To contact [facility]>   Check the care team in [facility] and look for the attending/consulting TRH provider listed  Log into [email or web address] and use [place]'s universal password   Go to> [facility]  and find provider  If you still have difficulty reaching the provider, please page the [facility] (Director on Call) for the Hospitalists listed on amion     "

## 2024-05-26 DIAGNOSIS — G9340 Encephalopathy, unspecified: Secondary | ICD-10-CM | POA: Diagnosis not present

## 2024-05-26 NOTE — Progress Notes (Signed)
 " [facility] Progress Note  Patient: [name] [name]     FMW:[id number]  DOA: 03/02/2024   PCP: [name], [name]       Brief hospital course: [phone] with h/o type 1 bipolar d/o, tardive dyskinesia, T2DM, frequent falls, and HLD who presented on 10/18 with AMS.  He was initially boarded in the ER and was eventually admitted for evaluation of encephalopathy on 10/24.  He is awaiting SNF placement.   10/12.  Patient has been living with a friend for the last 2 to 3 years, patient was having bizarre behavior so friend did not let him come back to the apartment. Patient was boarded in the emergency room since 10/12. 10/19, psychiatry consulted as patient was endorsing suicidal thoughts-IVC 10/22, psychiatry cleared for discharge. 10/24, patient was noted to be encephalopathic so admitted to the hospital.  Multiple investigations were negative. 11/4, MRI under sedation, transient hypotension and altered mental status-overnight in ICU. 11/5, clinically improved.  Sedation wore off.  Transferred back to MedSurg bed.   11/18.  Persistent hypertension as well as multiple comorbidities, ongoing encephalopathy and patient was made DNR with two-physician consent. APS was involved although declined guardianship somewhere in December 2025. Referrals were sent for SNF in December 2025 05/16/2024 assessment shows patient has capacity.  Patient wanted to be full code. 1/8 started on Cogentin  1 mg twice daily 1/9 LFT normal.  Ammonia 35, Depakote  level 76. 1/12 Cogentin  dose reduced to 0.5 mg twice daily due to excessive drowsiness.    Subjective:  No complaints.   Assessment and Plan: Principal Problem:   Acute metabolic encephalopathy, bipolar disorder, tardive dyskinesia - CT and MRI brain negative - EEG negative - TSH, B12, ammonia and thiamine  levels normal - RPR nonreactive - Etiology for delirium undetermined - Patient is now much more oriented and has capacity - Currently receiving  benztropine , Depakote , Cymbalta , Ingrezza  and as needed Haldol  which he is not needing   Active Problems: Right knee pain- 1/14 - She states has been going on for about 2 weeks and is limiting his ambulation - Voltaren  ordered however per nurse, patient complained of increasing pain and she noted increasing erythema - resolved    Type 2 diabetes mellitus with hyperglycemia, without long-term current use of insulin  - Continue metformin     Essential hypertension - Continue Inderal   Chronic tremor of arms and hands   Insomnia - Trazodone   Disposition: Waiting on SNF    Code Status: Full Code Total time on patient care: 35 minutes DVT prophylaxis:  enoxaparin  (LOVENOX ) injection 40 mg Start: 03/08/24 0800     Objective:   Vitals:   05/25/24 1321 05/25/24 1941 05/26/24 0502 05/26/24 1321  BP: 120/75 125/76 135/80 114/80  Pulse: 72 65 (!) 53 74  Resp: 18 18 17 18   Temp: 98.2 F (36.8 C) 97.6 F (36.4 C) 97.8 F (36.6 C) (!) 97.5 F (36.4 C)  TempSrc: Oral Oral    SpO2: 94% 96% 98% 96%  Weight:      Height:       Filed Weights   03/07/24 2229 03/26/24 1715  Weight: 87.4 kg 83 kg   Exam: General exam: Appears comfortable  HEENT: oral mucosa moist Respiratory system: Clear to auscultation.  Cardiovascular system: S1 & S2 heard  Gastrointestinal system: Abdomen soft, non-tender, nondistended. Normal bowel sounds   Extremities: No cyanosis, clubbing or edema MSK no tenderness in knees Psychiatry:  Mood & affect appropriate.      CBC: Recent  Labs  Lab 05/20/24 1038  WBC 8.4  NEUTROABS 4.3  HGB 16.5  HCT 47.1  MCV 85.5  PLT 208   Basic Metabolic Panel: Recent Labs  Lab 05/20/24 1350  NA 137  K 4.1  CL 96*  CO2 31  GLUCOSE 111*  BUN 12  CREATININE 0.85  CALCIUM  9.5  MG 1.8     Scheduled Meds:  atorvastatin   20 mg Oral Daily   benztropine   0.5 mg Oral Daily   divalproex   625 mg Oral Q8H   DULoxetine   60 mg Oral BID   enoxaparin  (LOVENOX )  injection  40 mg Subcutaneous Q24H   hydrOXYzine   50 mg Oral QHS   multivitamin with minerals  1 tablet Oral Daily   propranolol   5 mg Oral BID   tuberculin  5 Units Intradermal Once   valbenazine   80 mg Oral Daily    Imaging and lab data personally reviewed   Author: Adalynd Donahoe  05/26/2024 2:36 PM  To contact [facility]>   Check the care team in [facility] and look for the attending/consulting [facility] provider listed  Log into [email or web address] and use 's universal password   Go to> [facility]  and find provider  If you still have difficulty reaching the provider, please page the [facility] (Director on Call) for the Hospitalists listed on amion     "

## 2024-05-26 NOTE — Plan of Care (Signed)

## 2024-05-27 DIAGNOSIS — G9340 Encephalopathy, unspecified: Secondary | ICD-10-CM | POA: Diagnosis not present

## 2024-05-27 NOTE — Plan of Care (Signed)

## 2024-05-27 NOTE — Progress Notes (Signed)
 " [facility] Progress Note  Patient: [name] [name]     FMW:[id number]  DOA: 03/02/2024   PCP: [name], [name]       Brief hospital course: [phone] with h/o type 1 bipolar d/o, tardive dyskinesia, T2DM, frequent falls, and HLD who presented on 10/18 with AMS.  He was initially boarded in the ER and was eventually admitted for evaluation of encephalopathy on 10/24.  He is awaiting SNF placement.   10/12.  Patient has been living with a friend for the last 2 to 3 years, patient was having bizarre behavior so friend did not let him come back to the apartment. Patient was boarded in the emergency room since 10/12. 10/19, psychiatry consulted as patient was endorsing suicidal thoughts-IVC 10/22, psychiatry cleared for discharge. 10/24, patient was noted to be encephalopathic so admitted to the hospital.  Multiple investigations were negative. 11/4, MRI under sedation, transient hypotension and altered mental status-overnight in ICU. 11/5, clinically improved.  Sedation wore off.  Transferred back to MedSurg bed.   11/18.  Persistent hypertension as well as multiple comorbidities, ongoing encephalopathy and patient was made DNR with two-physician consent. APS was involved although declined guardianship somewhere in December 2025. Referrals were sent for SNF in December 2025 05/16/2024 assessment shows patient has capacity.  Patient wanted to be full code. 1/8 started on Cogentin  1 mg twice daily 1/9 LFT normal.  Ammonia 35, Depakote  level 76. 1/12 Cogentin  dose reduced to 0.5 mg twice daily due to excessive drowsiness.    Subjective:  No complaints.    Assessment and Plan: Principal Problem:   Acute metabolic encephalopathy, bipolar disorder, tardive dyskinesia - CT and MRI brain negative - EEG negative - TSH, B12, ammonia and thiamine  levels normal - RPR nonreactive - Etiology for delirium undetermined - Patient is now much more oriented and has capacity - Currently  receiving benztropine , Depakote , Cymbalta , Ingrezza  and as needed Haldol  which he is not needing   Active Problems: Right knee pain- 1/14 - She states has been going on for about 2 weeks and is limiting his ambulation - Voltaren  ordered however per nurse, patient complained of increasing pain and she noted increasing erythema - resolved    Type 2 diabetes mellitus with hyperglycemia, without long-term current use of insulin  - Continue metformin     Essential hypertension - Continue Inderal   Chronic tremor of arms and hands   Insomnia - Trazodone   Disposition: Waiting on SNF    Code Status: Full Code Total time on patient care: 35 minutes DVT prophylaxis:  enoxaparin  (LOVENOX ) injection 40 mg Start: 03/08/24 0800     Objective:   Vitals:   05/26/24 1942 05/27/24 0359 05/27/24 0951 05/27/24 1402  BP: 98/67 101/68 107/65 118/74  Pulse: 76 70 68 80  Resp: 17 17  16   Temp: 97.9 F (36.6 C) 98.1 F (36.7 C)  (!) 97.3 F (36.3 C)  TempSrc: Oral Oral  Oral  SpO2: 95% 93%  95%  Weight:      Height:       Filed Weights   03/07/24 2229 03/26/24 1715  Weight: 87.4 kg 83 kg   Exam: General exam: Appears comfortable  HEENT: oral mucosa moist Respiratory system: Clear to auscultation.  Cardiovascular system: S1 & S2 heard  Gastrointestinal system: Abdomen soft, non-tender, nondistended. Normal bowel sounds   Extremities: No cyanosis, clubbing or edema MSK no tenderness in knees Psychiatry:  Mood & affect appropriate.      CBC: No results for input(s):  WBC, NEUTROABS, HGB, HCT, MCV, PLT in the last 168 hours.  Basic Metabolic Panel: No results for input(s): NA, K, CL, CO2, GLUCOSE, BUN, CREATININE, CALCIUM , MG, PHOS in the last 168 hours.    Scheduled Meds:  atorvastatin   20 mg Oral Daily   benztropine   0.5 mg Oral Daily   divalproex   625 mg Oral Q8H   DULoxetine   60 mg Oral BID   enoxaparin  (LOVENOX ) injection  40 mg Subcutaneous  Q24H   hydrOXYzine   50 mg Oral QHS   multivitamin with minerals  1 tablet Oral Daily   propranolol   5 mg Oral BID   tuberculin  5 Units Intradermal Once   valbenazine   80 mg Oral Daily    Imaging and lab data personally reviewed   Author: Mckinzy Fuller  05/27/2024 4:19 PM  To contact [facility]>   Check the care team in [facility] and look for the attending/consulting [facility] provider listed  Log into [email or web address] and use [place]'s universal password   Go to> [facility]  and find provider  If you still have difficulty reaching the provider, please page the [name] (Director on Call) for the Hospitalists listed on amion     "

## 2024-05-28 DIAGNOSIS — G9340 Encephalopathy, unspecified: Secondary | ICD-10-CM | POA: Diagnosis not present

## 2024-05-28 NOTE — Progress Notes (Signed)
 " [facility] Progress Note  Patient: [name] [name]     FMW:[id number]  DOA: 03/02/2024   PCP: [name], [name]       Brief hospital course: [phone] with h/o type 1 bipolar d/o, tardive dyskinesia, T2DM, frequent falls, and HLD who presented on 10/18 with AMS.  He was initially boarded in the ER and was eventually admitted for evaluation of encephalopathy on 10/24.  He is awaiting SNF placement.   10/12.  Patient has been living with a friend for the last 2 to 3 years, patient was having bizarre behavior so friend did not let him come back to the apartment. Patient was boarded in the emergency room since 10/12. 10/19, psychiatry consulted as patient was endorsing suicidal thoughts-IVC 10/22, psychiatry cleared for discharge. 10/24, patient was noted to be encephalopathic so admitted to the hospital.  Multiple investigations were negative. 11/4, MRI under sedation, transient hypotension and altered mental status-overnight in ICU. 11/5, clinically improved.  Sedation wore off.  Transferred back to MedSurg bed.   11/18.  Persistent hypertension as well as multiple comorbidities, ongoing encephalopathy and patient was made DNR with two-physician consent. APS was involved although declined guardianship somewhere in December 2025. Referrals were sent for SNF in December 2025 05/16/2024 assessment shows patient has capacity.  Patient wanted to be full code. 1/8 started on Cogentin  1 mg twice daily 1/9 LFT normal.  Ammonia 35, Depakote  level 76. 1/12 Cogentin  dose reduced to 0.5 mg twice daily due to excessive drowsiness.    Subjective:  Right knee hurts a little today.   Assessment and Plan: Principal Problem:   Acute metabolic encephalopathy, bipolar disorder, tardive dyskinesia - CT and MRI brain negative - EEG negative - TSH, B12, ammonia and thiamine  levels normal - RPR nonreactive - Etiology for delirium undetermined - Patient is now much more oriented and has capacity -  Currently receiving benztropine , Depakote , Cymbalta , Ingrezza  and as needed Haldol  which he is not needing   Active Problems: Right knee pain- 1/14 - She states has been going on for about 2 weeks and is limiting his ambulation - Voltaren  ordered however per nurse, patient complained of increasing pain and she noted increasing erythema - improved    Type 2 diabetes mellitus with hyperglycemia, without long-term current use of insulin  - Continue metformin     Essential hypertension - Continue Inderal   Chronic tremor of arms and hands   Insomnia - Trazodone   Disposition: Waiting on SNF    Code Status: Full Code Total time on patient care: 35 minutes DVT prophylaxis:  enoxaparin  (LOVENOX ) injection 40 mg Start: 03/08/24 0800     Objective:   Vitals:   05/27/24 1402 05/27/24 2041 05/28/24 0451 05/28/24 1307  BP: 118/74 111/73 111/72 91/68  Pulse: 80 75 (!) 55 64  Resp: 16 16 14 17   Temp: (!) 97.3 F (36.3 C) 97.7 F (36.5 C) 97.6 F (36.4 C) 97.9 F (36.6 C)  TempSrc: Oral   Oral  SpO2: 95% 93% 92% 94%  Weight:      Height:       Filed Weights   03/07/24 2229 03/26/24 1715  Weight: 87.4 kg 83 kg   Exam: General exam: Appears comfortable  HEENT: oral mucosa moist Respiratory system: Clear to auscultation.  Cardiovascular system: S1 & S2 heard  Gastrointestinal system: Abdomen soft, non-tender, nondistended. Normal bowel sounds   Extremities: No cyanosis, clubbing or edema MSK mild tenderness just above Right knee in quadriceps tendon Psychiatry:  Mood &  affect appropriate.      CBC: No results for input(s): WBC, NEUTROABS, HGB, HCT, MCV, PLT in the last 168 hours.  Basic Metabolic Panel: No results for input(s): NA, K, CL, CO2, GLUCOSE, BUN, CREATININE, CALCIUM , MG, PHOS in the last 168 hours.    Scheduled Meds:  atorvastatin   20 mg Oral Daily   benztropine   0.5 mg Oral Daily   divalproex   625 mg Oral Q8H   DULoxetine    60 mg Oral BID   enoxaparin  (LOVENOX ) injection  40 mg Subcutaneous Q24H   hydrOXYzine   50 mg Oral QHS   multivitamin with minerals  1 tablet Oral Daily   propranolol   5 mg Oral BID   valbenazine   80 mg Oral Daily    Imaging and lab data personally reviewed   Author: Isidra Mings  05/28/2024 2:27 PM  To contact [facility]>   Check the care team in [facility] and look for the attending/consulting [facility] provider listed  Log into [email or web address] and use [place]'s universal password   Go to> [facility]  and find provider  If you still have difficulty reaching the provider, please page the [facility] (Director on Call) for the Hospitalists listed on amion     "

## 2024-05-28 NOTE — Progress Notes (Signed)
 Mobility Specialist Progress Note:   05/28/24 1545  Mobility  Activity Ambulated with assistance  Level of Assistance Minimal assist, patient does 75% or more  Assistive Device Front wheel walker  Distance Ambulated (ft) 330 ft  Activity Response Tolerated well  Mobility Referral Yes  Mobility visit 1 Mobility  Mobility Specialist Start Time (ACUTE ONLY) 1449  Mobility Specialist Stop Time (ACUTE ONLY) 1502  Mobility Specialist Time Calculation (min) (ACUTE ONLY) 13 min   Pt was received in bed and agreed to mobility. Min A sit to stand, 1x bout of unsteadiness during stand but recovered well. Pt c/o LE pain during ambulation. Returned to bed with all needs met. Call bell in reach.  [employer] - Mobility Specialist

## 2024-05-28 NOTE — Plan of Care (Signed)
   Problem: Health Behavior/Discharge Planning: Goal: Ability to manage health-related needs will improve Outcome: Progressing   Problem: Clinical Measurements: Goal: Ability to maintain clinical measurements within normal limits will improve Outcome: Progressing Goal: Will remain free from infection Outcome: Progressing Goal: Diagnostic test results will improve Outcome: Progressing Goal: Respiratory complications will improve Outcome: Progressing

## 2024-05-28 NOTE — Plan of Care (Signed)

## 2024-05-29 ENCOUNTER — Encounter (HOSPITAL_COMMUNITY): Payer: Self-pay

## 2024-05-29 ENCOUNTER — Encounter (HOSPITAL_COMMUNITY): Admitting: Psychiatry

## 2024-05-29 DIAGNOSIS — G9340 Encephalopathy, unspecified: Secondary | ICD-10-CM | POA: Diagnosis not present

## 2024-05-29 NOTE — Plan of Care (Signed)

## 2024-05-29 NOTE — Progress Notes (Signed)
 " [facility] Progress Note  Patient: [name] [name]     FMW:[id number]  DOA: 03/02/2024   PCP: [name], [name]       Brief hospital course: [phone] with h/o type 1 bipolar d/o, tardive dyskinesia, T2DM, frequent falls, and HLD who presented on 10/18 with AMS.  He was initially boarded in the ER and was eventually admitted for evaluation of encephalopathy on 10/24.  He is awaiting SNF placement.   10/12.  Patient has been living with a friend for the last 2 to 3 years, patient was having bizarre behavior so friend did not let him come back to the apartment. Patient was boarded in the emergency room since 10/12. 10/19, psychiatry consulted as patient was endorsing suicidal thoughts-IVC 10/22, psychiatry cleared for discharge. 10/24, patient was noted to be encephalopathic so admitted to the hospital.  Multiple investigations were negative. 11/4, MRI under sedation, transient hypotension and altered mental status-overnight in ICU. 11/5, clinically improved.  Sedation wore off.  Transferred back to MedSurg bed.   11/18.  Persistent hypertension as well as multiple comorbidities, ongoing encephalopathy and patient was made DNR with two-physician consent. APS was involved although declined guardianship somewhere in December 2025. Referrals were sent for SNF in December 2025 05/16/2024 assessment shows patient has capacity.  Patient wanted to be full code. 1/8 started on Cogentin  1 mg twice daily 1/9 LFT normal.  Ammonia 35, Depakote  level 76. 1/12 Cogentin  dose reduced to 0.5 mg twice daily due to excessive drowsiness.  Waiting for [facility] to evaluate patient. TB skin test placed on 1/16.     Subjective:  Right knee hurts a little today.   Assessment and Plan: Principal Problem:   Acute metabolic encephalopathy, bipolar disorder, tardive dyskinesia - CT and MRI brain negative - EEG negative - TSH, B12, ammonia and thiamine  levels normal - RPR nonreactive -  Etiology for delirium undetermined - Patient is now much more oriented and has capacity - Currently receiving benztropine , Depakote , Cymbalta , Ingrezza  and as needed Haldol  which he is not needing   Active Problems: Right knee pain- 1/14 - She states has been going on for about 2 weeks and is limiting his ambulation - Voltaren  ordered however per nurse, patient complained of increasing pain and she noted increasing erythema - improved    Type 2 diabetes mellitus with hyperglycemia, without long-term current use of insulin  - Continue metformin     Essential hypertension - Continue Inderal   Chronic tremor of arms and hands   Insomnia - Trazodone   Disposition: Waiting on memory care    Code Status: Full Code Total time on patient care: 25 minutes DVT prophylaxis:  enoxaparin  (LOVENOX ) injection 40 mg Start: 03/08/24 0800     Objective:   Vitals:   05/28/24 2010 05/28/24 2143 05/29/24 0339 05/29/24 1337  BP: 104/76 104/76 91/69 109/67  Pulse: 67 67 (!) 57 64  Resp: 18  18 17   Temp: 98 F (36.7 C)  97.6 F (36.4 C) (!) 97.4 F (36.3 C)  TempSrc: Oral  Oral Oral  SpO2: 94%  98% 98%  Weight:      Height:       Filed Weights   03/07/24 2229 03/26/24 1715  Weight: 87.4 kg 83 kg   Exam: General exam: Appears comfortable  HEENT: oral mucosa moist Respiratory system: Clear to auscultation.  Cardiovascular system: S1 & S2 heard  Gastrointestinal system: Abdomen soft, non-tender, nondistended. Normal bowel sounds   Extremities: No cyanosis, clubbing or  edema MSK mild tenderness just above Right knee in quadriceps tendon Psychiatry:  Mood & affect appropriate.      CBC: No results for input(s): WBC, NEUTROABS, HGB, HCT, MCV, PLT in the last 168 hours.  Basic Metabolic Panel: No results for input(s): NA, K, CL, CO2, GLUCOSE, BUN, CREATININE, CALCIUM , MG, PHOS in the last 168 hours.    Scheduled Meds:  atorvastatin   20 mg Oral Daily    benztropine   0.5 mg Oral Daily   divalproex   625 mg Oral Q8H   DULoxetine   60 mg Oral BID   enoxaparin  (LOVENOX ) injection  40 mg Subcutaneous Q24H   hydrOXYzine   50 mg Oral QHS   multivitamin with minerals  1 tablet Oral Daily   propranolol   5 mg Oral BID   valbenazine   80 mg Oral Daily    Imaging and lab data personally reviewed   Author: Mylena Sedberry  05/29/2024 5:02 PM  To contact [facility]>   Check the care team in [facility] and look for the attending/consulting [facility] provider listed  Log into [email or web address] and use 's universal password   Go to> [facility]  and find provider  If you still have difficulty reaching the provider, please page the [facility] (Director on Call) for the Hospitalists listed on amion     "

## 2024-05-30 DIAGNOSIS — G9340 Encephalopathy, unspecified: Secondary | ICD-10-CM | POA: Diagnosis not present

## 2024-05-30 NOTE — Progress Notes (Signed)
 " PROGRESS NOTE    [name] [name]  FMW:[id number] DOB: 1965/06/23 DOA: 03/02/2024 PCP: [name], [name]   Brief Narrative: [phone] with h/o type 1 bipolar d/o, tardive dyskinesia, T2DM, frequent falls, and HLD who presented on 10/18 with AMS.  He was initially boarded in the ER and was eventually admitted for evaluation of encephalopathy on 10/24.  He is awaiting SNF placement.   10/12.  Patient has been living with a friend for the last 2 to 3 years, patient was having bizarre behavior so friend did not let him come back to the apartment. Patient was boarded in the emergency room since 10/12. 10/19, psychiatry consulted as patient was endorsing suicidal thoughts-IVC 10/22, psychiatry cleared for discharge. 10/24, patient was noted to be encephalopathic so admitted to the hospital.  Multiple investigations were negative. 11/4, MRI under sedation, transient hypotension and altered mental status-overnight in ICU. 11/5, clinically improved.  Sedation wore off.  Transferred back to MedSurg bed.   11/18.  Persistent hypertension as well as multiple comorbidities, ongoing encephalopathy and patient was made DNR with two-physician consent. APS was involved although declined guardianship somewhere in December 2025. Referrals were sent for SNF in December 2025 05/16/2024 assessment shows patient has capacity.  Patient wanted to be full code. 1/8 started on Cogentin  1 mg twice daily 1/9 LFT normal.  Ammonia 35, Depakote  level 76. 1/12 Cogentin  dose reduced to 0.5 mg twice daily due to excessive drowsiness. 1/21 saw patient at bedside he thinks he just peed and wants to be cleaned   Waiting for [facility] to evaluate patient. TB skin test placed on 1/16.  Assessment & Plan:   Principal Problem:   Acute encephalopathy Active Problems:   Type 2 diabetes mellitus with hyperglycemia, without long-term current use of insulin  (HCC)   Essential hypertension   Mixed hyperlipidemia    Tremor   Tardive dyskinesia   Acute delirium   Bipolar 1 disorder (HCC)   Goals of care, counseling/discussion   Pressure injury of skin   DNR (do not resuscitate)   Increased nutritional needs  Acute metabolic encephalopathy, bipolar disorder, tardive dyskinesia - CT and MRI brain negative - EEG negative - TSH, B12, ammonia and thiamine  levels normal - RPR nonreactive - Etiology for delirium undetermined - Patient is now much more oriented and has capacity - Currently receiving benztropine , Depakote , Cymbalta , Ingrezza  and as needed Haldol  which he is not needing    Active Problems: Right knee pain- 1/14 - She states has been going on for about 2 weeks and is limiting his ambulation - Voltaren  ordered however per nurse, patient complained of increasing pain and she noted increasing erythema - improved     Type 2 diabetes mellitus with hyperglycemia, without long-term current use of insulin  - Continue metformin      Essential hypertension - Continue Inderal    Chronic tremor of arms and hands   Insomnia - Trazodone    Disposition: Waiting on memory care     Wound 05/05/24 2200 Pressure Injury Coccyx Mid;Left;Right Stage 1 -  Intact skin with non-blanchable redness of a localized area usually over a bony prominence. (Active)      Nutrition Problem: Increased nutrient needs Etiology: acute illness     Signs/Symptoms: estimated needs    Interventions: Ensure Enlive (each supplement provides 350kcal and 20 grams of protein), MVI  Estimated body mass index is 26.26 kg/m as calculated from the following:   Height as of this encounter: 5' 10 (1.778 m).  Weight as of this encounter: 83 kg.  DVT prophylaxis: Lovenox   code Status: Full code Family Communication: None at bedside  disposition Plan:  Status is: Inpatient Waiting on memory care   Consultants: None  Procedures: None Antimicrobials none Subjective: Resting in bed right upper extremity resting  tremors noted  Objective: Vitals:   05/29/24 1337 05/29/24 1938 05/29/24 2256 05/30/24 0543  BP: 109/67 (!) 108/49 (!) 108/49 110/88  Pulse: 64 67 67 (!) 59  Resp: 17 16  18   Temp: (!) 97.4 F (36.3 C) 98.2 F (36.8 C)  (!) 97.5 F (36.4 C)  TempSrc: Oral Oral    SpO2: 98% 99%  95%  Weight:      Height:       No intake or output data in the 24 hours ending 05/30/24 1200 Filed Weights   03/07/24 2229 03/26/24 1715  Weight: 87.4 kg 83 kg    Examination:  General exam: Appears calm and comfortable  Respiratory system: Clear to auscultation. Respiratory effort normal. Cardiovascular system: Regular Gastrointestinal system: Abdomen is nondistended, soft and nontender. No organomegaly or masses felt. Normal bowel sounds heard. Extremities: No edema   Data Reviewed: I have personally reviewed following labs and imaging studies  CBC: No results for input(s): WBC, NEUTROABS, HGB, HCT, MCV, PLT in the last 168 hours. Basic Metabolic Panel: No results for input(s): NA, K, CL, CO2, GLUCOSE, BUN, CREATININE, CALCIUM , MG, PHOS in the last 168 hours. GFR: Estimated Creatinine Clearance: 96.6 mL/min (by C-G formula based on SCr of 0.85 mg/dL). Liver Function Tests: No results for input(s): AST, ALT, ALKPHOS, BILITOT, PROT, ALBUMIN  in the last 168 hours. No results for input(s): LIPASE, AMYLASE in the last 168 hours. No results for input(s): AMMONIA in the last 168 hours. Coagulation Profile: No results for input(s): INR, PROTIME in the last 168 hours. Cardiac Enzymes: No results for input(s): CKTOTAL, CKMB, CKMBINDEX, TROPONINI in the last 168 hours. BNP (last 3 results) No results for input(s): PROBNP in the last 8760 hours. HbA1C: No results for input(s): HGBA1C in the last 72 hours. CBG: No results for input(s): GLUCAP in the last 168 hours. Lipid Profile: No results for input(s): CHOL, HDL, LDLCALC,  TRIG, CHOLHDL, LDLDIRECT in the last 72 hours. Thyroid  Function Tests: No results for input(s): TSH, T4TOTAL, FREET4, T3FREE, THYROIDAB in the last 72 hours. Anemia Panel: No results for input(s): VITAMINB12, FOLATE, FERRITIN, TIBC, IRON, RETICCTPCT in the last 72 hours. Sepsis Labs: No results for input(s): PROCALCITON, LATICACIDVEN in the last 168 hours.  No results found for this or any previous visit (from the past 240 hours).   Radiology Studies: No results found.  Scheduled Meds:  atorvastatin   20 mg Oral Daily   benztropine   0.5 mg Oral Daily   divalproex   625 mg Oral Q8H   DULoxetine   60 mg Oral BID   enoxaparin  (LOVENOX ) injection  40 mg Subcutaneous Q24H   hydrOXYzine   50 mg Oral QHS   multivitamin with minerals  1 tablet Oral Daily   propranolol   5 mg Oral BID   valbenazine   80 mg Oral Daily   Continuous Infusions:   LOS: 84 days    Almarie KANDICE Hoots, MD  05/30/2024, 12:00 PM    "

## 2024-05-30 NOTE — TOC Progression Note (Addendum)
 Transition of Care [facility]) - Progression Note    Patient Details  Name: [name] [name] MRN: [id number] Date of Birth: 04/25/1966  [facility] CM/SW Contact  Doneta Glenys DASEN, RN Phone Number: 05/30/2024, 3:24 PM  Clinical Narrative:    APS Shefi called requesting a Medicaid Dual application be completed with patient. Received update that patient has been accepted at [facility], [address], [place], [place].[name] [phone])  The earliest patient cane transfer is Tuesday (1/21).     Barriers to Discharge: SNF Pending bed offer               Expected Discharge Plan and Services                                               Social Drivers of Health (SDOH) Interventions SDOH Screenings   Food Insecurity: Patient Unable To Answer (03/07/2024)  Housing: Unknown (03/07/2024)  Transportation Needs: Patient Unable To Answer (03/07/2024)  Utilities: Patient Unable To Answer (03/07/2024)  Depression (PHQ2-9): High Risk (01/26/2024)  Social Connections: Unknown (09/22/2021)   Received from [facility]  Tobacco Use: Low Risk (03/13/2024)    Readmission Risk Interventions     No data to display

## 2024-05-30 NOTE — TOC Progression Note (Addendum)
 Transition of Care [facility]) - Progression Note    Patient Details  Name: [name] [name] MRN: [id number] Date of Birth: Jan 30, 1966  Transition of Care [facility]) CM/SW Contact  [name] Glenys DASEN, RN Phone Number: 05/30/2024, 9:04 AM  Clinical Narrative:    3:36 PM Patient Aunt called to give patients fathers name [name] [name]  9:12 AM CM received chat that patients [name] [name], Emergency Contact [phone] would like a call. CM called discussed HC POA. [name] [name] agreeable to becoming patients HC POA. CM will request Chaplain consult to call [name] [name]. 9:04 AM CM spoke with [facility] and she said they are missing the consent for disclosure.  Waiting for [name] [name] to call me. CM sent email to [facility] rep. [name] [name] asking if this is something that she can handle. [facility] Medicaid [name] [name] [phone] called for update on patient status for placement.     Barriers to Discharge: SNF Pending bed offer               Expected Discharge Plan and Services                                               Social Drivers of Health (SDOH) Interventions SDOH Screenings   Food Insecurity: Patient Unable To Answer (03/07/2024)  Housing: Unknown (03/07/2024)  Transportation Needs: Patient Unable To Answer (03/07/2024)  Utilities: Patient Unable To Answer (03/07/2024)  Depression (PHQ2-9): High Risk (01/26/2024)  Social Connections: Unknown (09/22/2021)   Received from [facility]  Tobacco Use: Low Risk (03/13/2024)    Readmission Risk Interventions     No data to display

## 2024-05-30 NOTE — Plan of Care (Signed)
   Problem: Health Behavior/Discharge Planning: Goal: Ability to manage health-related needs will improve Outcome: Progressing   Problem: Clinical Measurements: Goal: Ability to maintain clinical measurements within normal limits will improve Outcome: Progressing

## 2024-05-30 NOTE — Plan of Care (Signed)

## 2024-05-30 NOTE — Progress Notes (Signed)
..  SPIRITUAL CARE AND COUNSELING CONSULT NOTE   VISIT SUMMARY Patient has paperwork, chaplain will follow-up with relative to complete paperwork   SPIRITUAL ENCOUNTER                                                                                                                                                                      Type of Visit: Initial Care provided to:: Pt and family Reason for visit: Advance directives OnCall Visit: No   SPIRITUAL FRAMEWORK      GOALS       INTERVENTIONS        INTERVENTION OUTCOMES      SPIRITUAL CARE PLAN        If immediate needs arise, please contact Darryle Law / Behavioral Health 24 hour on call [phone]   Jerona James  05/30/2024 2:28 PM

## 2024-05-31 DIAGNOSIS — G9340 Encephalopathy, unspecified: Secondary | ICD-10-CM | POA: Diagnosis not present

## 2024-05-31 LAB — COMPREHENSIVE METABOLIC PANEL WITH GFR
ALT: 18 U/L (ref 0–44)
AST: 19 U/L (ref 15–41)
Albumin: 3.5 g/dL (ref 3.5–5.0)
Alkaline Phosphatase: 43 U/L (ref 38–126)
Anion gap: 7 (ref 5–15)
BUN: 16 mg/dL (ref 6–20)
CO2: 30 mmol/L (ref 22–32)
Calcium: 9.2 mg/dL (ref 8.9–10.3)
Chloride: 99 mmol/L (ref 98–111)
Creatinine, Ser: 0.93 mg/dL (ref 0.61–1.24)
GFR, Estimated: >60 mL/min
Glucose, Bld: 97 mg/dL (ref 70–99)
Potassium: 4.2 mmol/L (ref 3.5–5.1)
Sodium: 136 mmol/L (ref 135–145)
Total Bilirubin: 0.5 mg/dL (ref 0.0–1.2)
Total Protein: 5.9 g/dL — ABNORMAL LOW (ref 6.5–8.1)

## 2024-05-31 LAB — CBC
HCT: 43.3 % (ref 39.0–52.0)
Hemoglobin: 15.1 g/dL (ref 13.0–17.0)
MCH: 30.3 pg (ref 26.0–34.0)
MCHC: 34.9 g/dL (ref 30.0–36.0)
MCV: 86.9 fL (ref 80.0–100.0)
Platelets: 177 K/uL (ref 150–400)
RBC: 4.98 MIL/uL (ref 4.22–5.81)
RDW: 15.9 % — ABNORMAL HIGH (ref 11.5–15.5)
WBC: 8.7 K/uL (ref 4.0–10.5)
nRBC: 0 % (ref 0.0–0.2)

## 2024-05-31 NOTE — Progress Notes (Signed)
 " PROGRESS NOTE    [name] [name]  FMW:[id number] DOB: March 01, 1966 DOA: 03/02/2024 PCP: [name], [name]   Brief Narrative: [phone] with h/o type 1 bipolar d/o, tardive dyskinesia, T2DM, frequent falls, and HLD who presented on 10/18 with AMS.  He was initially boarded in the ER and was eventually admitted for evaluation of encephalopathy on 10/24.  He is awaiting SNF placement.   10/12.  Patient has been living with a friend for the last 2 to 3 years, patient was having bizarre behavior so friend did not let him come back to the apartment. Patient was boarded in the emergency room since 10/12. 10/19, psychiatry consulted as patient was endorsing suicidal thoughts-IVC 10/22, psychiatry cleared for discharge. 10/24, patient was noted to be encephalopathic so admitted to the hospital.  Multiple investigations were negative. 11/4, MRI under sedation, transient hypotension and altered mental status-overnight in ICU. 11/5, clinically improved.  Sedation wore off.  Transferred back to MedSurg bed.   11/18.  Persistent hypertension as well as multiple comorbidities, ongoing encephalopathy and patient was made DNR with two-physician consent. APS was involved although declined guardianship somewhere in December 2025. Referrals were sent for SNF in December 2025 05/16/2024 assessment shows patient has capacity.  Patient wanted to be full code. 1/8 started on Cogentin  1 mg twice daily 1/9 LFT normal.  Ammonia 35, Depakote  level 76. 1/12 Cogentin  dose reduced to 0.5 mg twice daily due to excessive drowsiness. 1/21 saw patient at bedside he thinks he just peed and wants to be cleaned   Waiting for [facility] to evaluate patient. TB skin test placed on 1/16.  Assessment & Plan:   Principal Problem:   Acute encephalopathy Active Problems:   Type 2 diabetes mellitus with hyperglycemia, without long-term current use of insulin  (HCC)   Essential hypertension   Mixed hyperlipidemia    Tremor   Tardive dyskinesia   Acute delirium   Bipolar 1 disorder (HCC)   Goals of care, counseling/discussion   Pressure injury of skin   DNR (do not resuscitate)   Increased nutritional needs  Acute metabolic encephalopathy, bipolar disorder, tardive dyskinesia - CT and MRI brain negative - EEG negative - TSH, B12, ammonia and thiamine  levels normal - RPR nonreactive - Etiology for delirium undetermined - Patient is now much more oriented and has capacity - Currently receiving benztropine , Depakote , Cymbalta , Ingrezza  and as needed Haldol  which he is not needing    Active Problems: Right knee pain- 1/14 - She states has been going on for about 2 weeks and is limiting his ambulation - Voltaren  ordered however per nurse, patient complained of increasing pain and she noted increasing erythema - improved     Type 2 diabetes mellitus with hyperglycemia, without long-term current use of insulin  - Continue metformin      Essential hypertension - Continue Inderal    Chronic tremor of arms and hands   Insomnia - Trazodone    Disposition: Waiting on memory care     Wound 05/05/24 2200 Pressure Injury Coccyx Mid;Left;Right Stage 1 -  Intact skin with non-blanchable redness of a localized area usually over a bony prominence. (Active)      Nutrition Problem: Increased nutrient needs Etiology: acute illness     Signs/Symptoms: estimated needs    Interventions: Ensure Enlive (each supplement provides 350kcal and 20 grams of protein), MVI  Estimated body mass index is 26.26 kg/m as calculated from the following:   Height as of this encounter: 5' 10 (1.778 m).  Weight as of this encounter: 83 kg.  DVT prophylaxis: Lovenox   code Status: Full code Family Communication: None at bedside  disposition Plan:  Status is: Inpatient Waiting on memory care   Consultants: None  Procedures: None Antimicrobials none Subjective:  No changes   Objective: Vitals:    05/30/24 0543 05/30/24 1251 05/30/24 2019 05/31/24 0505  BP: 110/88 108/71 104/77 110/60  Pulse: (!) 59 73 80 (!) 56  Resp: 18 16 18 18   Temp: (!) 97.5 F (36.4 C) 97.6 F (36.4 C) 98.4 F (36.9 C) 97.8 F (36.6 C)  TempSrc:  Oral Oral   SpO2: 95% 98% 96% 95%  Weight:      Height:       No intake or output data in the 24 hours ending 05/31/24 1349 Filed Weights   03/07/24 2229 03/26/24 1715  Weight: 87.4 kg 83 kg    Examination:  General exam: Appears calm and comfortable  Respiratory system: Clear to auscultation. Respiratory effort normal. Cardiovascular system: Regular Gastrointestinal system: Abdomen is nondistended, soft and nontender. No organomegaly or masses felt. Normal bowel sounds heard. Extremities: No edema   Data Reviewed: I have personally reviewed following labs and imaging studies  CBC: Recent Labs  Lab 05/31/24 0437  WBC 8.7  HGB 15.1  HCT 43.3  MCV 86.9  PLT 177   Basic Metabolic Panel: Recent Labs  Lab 05/31/24 0437  NA 136  K 4.2  CL 99  CO2 30  GLUCOSE 97  BUN 16  CREATININE 0.93  CALCIUM  9.2   GFR: Estimated Creatinine Clearance: 88.3 mL/min (by C-G formula based on SCr of 0.93 mg/dL). Liver Function Tests: Recent Labs  Lab 05/31/24 0437  AST 19  ALT 18  ALKPHOS 43  BILITOT 0.5  PROT 5.9*  ALBUMIN  3.5   No results for input(s): LIPASE, AMYLASE in the last 168 hours. No results for input(s): AMMONIA in the last 168 hours. Coagulation Profile: No results for input(s): INR, PROTIME in the last 168 hours. Cardiac Enzymes: No results for input(s): CKTOTAL, CKMB, CKMBINDEX, TROPONINI in the last 168 hours. BNP (last 3 results) No results for input(s): PROBNP in the last 8760 hours. HbA1C: No results for input(s): HGBA1C in the last 72 hours. CBG: No results for input(s): GLUCAP in the last 168 hours. Lipid Profile: No results for input(s): CHOL, HDL, LDLCALC, TRIG, CHOLHDL, LDLDIRECT  in the last 72 hours. Thyroid  Function Tests: No results for input(s): TSH, T4TOTAL, FREET4, T3FREE, THYROIDAB in the last 72 hours. Anemia Panel: No results for input(s): VITAMINB12, FOLATE, FERRITIN, TIBC, IRON, RETICCTPCT in the last 72 hours. Sepsis Labs: No results for input(s): PROCALCITON, LATICACIDVEN in the last 168 hours.  No results found for this or any previous visit (from the past 240 hours).   Radiology Studies: No results found.  Scheduled Meds:  atorvastatin   20 mg Oral Daily   benztropine   0.5 mg Oral Daily   divalproex   625 mg Oral Q8H   DULoxetine   60 mg Oral BID   enoxaparin  (LOVENOX ) injection  40 mg Subcutaneous Q24H   hydrOXYzine   50 mg Oral QHS   multivitamin with minerals  1 tablet Oral Daily   propranolol   5 mg Oral BID   valbenazine   80 mg Oral Daily   Continuous Infusions:   LOS: 85 days    Almarie KANDICE Hoots, MD  05/31/2024, 1:49 PM    "

## 2024-05-31 NOTE — Plan of Care (Signed)

## 2024-05-31 NOTE — TOC Progression Note (Addendum)
 Transition of Care [facility]) - Progression Note    Patient Details  Name: [name] [name] MRN: [id number] Date of Birth: [birth year]  [facility]) CM/SW Contact  Doneta Glenys DASEN, RN Phone Number: 05/31/2024, 9:33 AM  Clinical Narrative:    3:03 PM Letter fax to [facility] with confirmation of success. Letter uploaded to [facility]. 9:33 AM CM spoke with [facility]  [phone]. [phone] (Fax: [phone]) requesting a letter stating patients medical conditions: Parkinson, Bipolar, MDD (major depressive disorder) PTSD (post-traumatic stress disorder), Generalized anxiety disorder recurrent major depressive disorder Bipolar I disorder, and that he is competent to give verbal consent for family member to speak on his behalf. CM informed Dr. Will of the request.     Barriers to Discharge: SNF Pending bed offer               Expected Discharge Plan and Services                                               Social Drivers of Health (SDOH) Interventions SDOH Screenings   Food Insecurity: Patient Unable To Answer (03/07/2024)  Housing: Unknown (03/07/2024)  Transportation Needs: Patient Unable To Answer (03/07/2024)  Utilities: Patient Unable To Answer (03/07/2024)  Depression (PHQ2-9): High Risk (01/26/2024)  Social Connections: Unknown (09/22/2021)   Received from [facility]  Tobacco Use: Low Risk (03/13/2024)    Readmission Risk Interventions     No data to display

## 2024-05-31 NOTE — Progress Notes (Signed)
" °   05/28/24 2301  PPD Results  Does patient have an induration at the injection site? No    "

## 2024-05-31 NOTE — Plan of Care (Signed)
   Problem: Health Behavior/Discharge Planning: Goal: Ability to manage health-related needs will improve Outcome: Progressing   Problem: Clinical Measurements: Goal: Ability to maintain clinical measurements within normal limits will improve Outcome: Progressing Goal: Will remain free from infection Outcome: Progressing

## 2024-06-01 DIAGNOSIS — G9340 Encephalopathy, unspecified: Secondary | ICD-10-CM | POA: Diagnosis not present

## 2024-06-01 NOTE — Progress Notes (Signed)
 " PROGRESS NOTE    [name] [name]  FMW:[id number] DOB: 1966-04-14 DOA: 03/02/2024 PCP: [name] Jeoffrey [name], [name]   Brief Narrative: [phone] with h/o type 1 bipolar d/o, tardive dyskinesia, T2DM, frequent falls, and HLD who presented on 10/18 with AMS.  He was initially boarded in the ER and was eventually admitted for evaluation of encephalopathy on 10/24.  He is awaiting SNF placement.   10/12.  Patient has been living with a friend for the last 2 to 3 years, patient was having bizarre behavior so friend did not let him come back to the apartment. Patient was boarded in the emergency room since 10/12. 10/19, psychiatry consulted as patient was endorsing suicidal thoughts-IVC 10/22, psychiatry cleared for discharge. 10/24, patient was noted to be encephalopathic so admitted to the hospital.  Multiple investigations were negative. 11/4, MRI under sedation, transient hypotension and altered mental status-overnight in ICU. 11/5, clinically improved.  Sedation wore off.  Transferred back to MedSurg bed.   11/18.  Persistent hypertension as well as multiple comorbidities, ongoing encephalopathy and patient was made DNR with two-physician consent. APS was involved although declined guardianship somewhere in December 2025. Referrals were sent for SNF in December 2025 05/16/2024 assessment shows patient has capacity.  Patient wanted to be full code. 1/8 started on Cogentin  1 mg twice daily 1/9 LFT normal.  Ammonia 35, Depakote  level 76. 1/12 Cogentin  dose reduced to 0.5 mg twice daily due to excessive drowsiness. 1/21 saw patient at bedside he thinks he just peed and wants to be cleaned   Waiting for [name] [name] to evaluate patient. TB skin test placed on 1/16.  Assessment & Plan:   Principal Problem:   Acute encephalopathy Active Problems:   Type 2 diabetes mellitus with hyperglycemia, without long-term current use of insulin  (HCC)   Essential hypertension   Mixed hyperlipidemia    Tremor   Tardive dyskinesia   Acute delirium   Bipolar 1 disorder (HCC)   Goals of care, counseling/discussion   Pressure injury of skin   DNR (do not resuscitate)   Increased nutritional needs  Acute metabolic encephalopathy, bipolar disorder, tardive dyskinesia - CT and MRI brain negative - EEG negative - TSH, B12, ammonia and thiamine  levels normal - RPR nonreactive - Etiology for delirium undetermined - Patient is now much more oriented and has capacity - Currently receiving benztropine , Depakote , Cymbalta , Ingrezza  and as needed Haldol  which he is not needing    Active Problems: Right knee pain- 1/14 - She states has been going on for about 2 weeks and is limiting his ambulation -Continue Voltaren      Type 2 diabetes mellitus with hyperglycemia, without long-term current use of insulin  - Continue metformin      Essential hypertension - Continue Inderal    Chronic tremor of arms and hands   Insomnia - Trazodone    Disposition: Waiting on memory care     Wound 05/05/24 2200 Pressure Injury Coccyx Mid;Left;Right Stage 1 -  Intact skin with non-blanchable redness of a localized area usually over a bony prominence. (Active)      Nutrition Problem: Increased nutrient needs Etiology: acute illness     Signs/Symptoms: estimated needs    Interventions: Ensure Enlive (each supplement provides 350kcal and 20 grams of protein), MVI  Estimated body mass index is 26.26 kg/m as calculated from the following:   Height as of this encounter: 5' 10 (1.778 m).   Weight as of this encounter: 83 kg.  DVT prophylaxis: Lovenox   code Status: Full  code Family Communication: None at bedside  disposition Plan:  Status is: Inpatient Waiting on memory care   Consultants: None  Procedures: None Antimicrobials none Subjective: No events overnight reported by the nursing staff still waiting for placement Labs from yesterday stable Objective: Vitals:   05/31/24 0505  05/31/24 1409 05/31/24 1954 06/01/24 0446  BP: 110/60 104/68 109/74 113/72  Pulse: (!) 56 68 62 (!) 55  Resp: 18  18 18   Temp: 97.8 F (36.6 C) (!) 97.4 F (36.3 C) 97.9 F (36.6 C) (!) 97.5 F (36.4 C)  TempSrc:  Oral Oral   SpO2: 95% 98% 99% 95%  Weight:      Height:       No intake or output data in the 24 hours ending 06/01/24 1129 Filed Weights   03/07/24 2229 03/26/24 1715  Weight: 87.4 kg 83 kg    Examination:  General exam: Appears calm and comfortable  Respiratory system: Clear to auscultation. Respiratory effort normal. Cardiovascular system: Regular Gastrointestinal system: Abdomen is nondistended, soft and nontender. No organomegaly or masses felt. Normal bowel sounds heard. Extremities: No edema   Data Reviewed: I have personally reviewed following labs and imaging studies  CBC: Recent Labs  Lab 05/31/24 0437  WBC 8.7  HGB 15.1  HCT 43.3  MCV 86.9  PLT 177   Basic Metabolic Panel: Recent Labs  Lab 05/31/24 0437  NA 136  K 4.2  CL 99  CO2 30  GLUCOSE 97  BUN 16  CREATININE 0.93  CALCIUM  9.2   GFR: Estimated Creatinine Clearance: 88.3 mL/min (by C-G formula based on SCr of 0.93 mg/dL). Liver Function Tests: Recent Labs  Lab 05/31/24 0437  AST 19  ALT 18  ALKPHOS 43  BILITOT 0.5  PROT 5.9*  ALBUMIN  3.5   No results for input(s): LIPASE, AMYLASE in the last 168 hours. No results for input(s): AMMONIA in the last 168 hours. Coagulation Profile: No results for input(s): INR, PROTIME in the last 168 hours. Cardiac Enzymes: No results for input(s): CKTOTAL, CKMB, CKMBINDEX, TROPONINI in the last 168 hours. BNP (last 3 results) No results for input(s): PROBNP in the last 8760 hours. HbA1C: No results for input(s): HGBA1C in the last 72 hours. CBG: No results for input(s): GLUCAP in the last 168 hours. Lipid Profile: No results for input(s): CHOL, HDL, LDLCALC, TRIG, CHOLHDL, LDLDIRECT in the last  72 hours. Thyroid  Function Tests: No results for input(s): TSH, T4TOTAL, FREET4, T3FREE, THYROIDAB in the last 72 hours. Anemia Panel: No results for input(s): VITAMINB12, FOLATE, FERRITIN, TIBC, IRON, RETICCTPCT in the last 72 hours. Sepsis Labs: No results for input(s): PROCALCITON, LATICACIDVEN in the last 168 hours.  No results found for this or any previous visit (from the past 240 hours).   Radiology Studies: No results found.  Scheduled Meds:  atorvastatin   20 mg Oral Daily   benztropine   0.5 mg Oral Daily   divalproex   625 mg Oral Q8H   DULoxetine   60 mg Oral BID   enoxaparin  (LOVENOX ) injection  40 mg Subcutaneous Q24H   hydrOXYzine   50 mg Oral QHS   multivitamin with minerals  1 tablet Oral Daily   propranolol   5 mg Oral BID   valbenazine   80 mg Oral Daily   Continuous Infusions:   LOS: 86 days    Almarie KANDICE Hoots, MD  06/01/2024, 11:29 AM    "

## 2024-06-01 NOTE — Progress Notes (Signed)
 This was a follow up visit with [name] [name] and his aunt, [name] [name].  He wants her to be his HCPOA.  He has been given paperwork previously, but requested new paperwork.  I spoke with him and his aunt and provided education on the paperwork.  She will help him fill it out tomorrow and we can notarize it next week.  [address] Bcc Pager, [phone]

## 2024-06-01 NOTE — Plan of Care (Signed)

## 2024-06-02 DIAGNOSIS — G9340 Encephalopathy, unspecified: Secondary | ICD-10-CM | POA: Diagnosis not present

## 2024-06-02 NOTE — Progress Notes (Signed)
 Mobility Specialist - Progress Note:   06/02/24 1558  Mobility  Activity Ambulated with assistance  Level of Assistance Minimal assist, patient does 75% or more  Assistive Device Front wheel walker  Distance Ambulated (ft) 20 ft  Activity Response Tolerated well  Mobility Referral Yes  Mobility visit 1 Mobility  Mobility Specialist Start Time (ACUTE ONLY) 1500  Mobility Specialist Stop Time (ACUTE ONLY) 1522  Mobility Specialist Time Calculation (min) (ACUTE ONLY) 22 min   Pt was received in bed and agreed to mobility; Pt needed to use restroom. Min A bed mobility and sit to stand. Pt had bowel incontinence on the way to restroom. Returned to bed once finished with all needs met. Call bell in reach and bed alarm on.  [employer] - Mobility Specialist - Acute Rehabilitation Can be reached via [employer]

## 2024-06-02 NOTE — Plan of Care (Signed)

## 2024-06-02 NOTE — Progress Notes (Signed)
 " PROGRESS NOTE    [name] [name]  FMW:[id number] DOB: 07-Dec-1965 DOA: 03/02/2024 PCP: [name], [name]   Brief Narrative: [phone] with h/o type 1 bipolar d/o, tardive dyskinesia, T2DM, frequent falls, and HLD who presented on 10/18 with AMS.  He was initially boarded in the ER and was eventually admitted for evaluation of encephalopathy on 10/24.  He is awaiting SNF placement.   10/12.  Patient has been living with a friend for the last 2 to 3 years, patient was having bizarre behavior so friend did not let him come back to the apartment. Patient was boarded in the emergency room since 10/12. 10/19, psychiatry consulted as patient was endorsing suicidal thoughts-IVC 10/22, psychiatry cleared for discharge. 10/24, patient was noted to be encephalopathic so admitted to the hospital.  Multiple investigations were negative. 11/4, MRI under sedation, transient hypotension and altered mental status-overnight in ICU. 11/5, clinically improved.  Sedation wore off.  Transferred back to MedSurg bed.   11/18.  Persistent hypertension as well as multiple comorbidities, ongoing encephalopathy and patient was made DNR with two-physician consent. APS was involved although declined guardianship somewhere in December 2025. Referrals were sent for SNF in December 2025 05/16/2024 assessment shows patient has capacity.  Patient wanted to be full code. 1/8 started on Cogentin  1 mg twice daily 1/9 LFT normal.  Ammonia 35, Depakote  level 76. 1/12 Cogentin  dose reduced to 0.5 mg twice daily due to excessive drowsiness. 1/21 saw patient at bedside he thinks he just peed and wants to be cleaned   Waiting for [facility] to evaluate patient. TB skin test placed on 1/16.  Assessment & Plan:   Principal Problem:   Acute encephalopathy Active Problems:   Type 2 diabetes mellitus with hyperglycemia, without long-term current use of insulin  (HCC)   Essential hypertension   Mixed hyperlipidemia    Tremor   Tardive dyskinesia   Acute delirium   Bipolar 1 disorder (HCC)   Goals of care, counseling/discussion   Pressure injury of skin   DNR (do not resuscitate)   Increased nutritional needs  Acute metabolic encephalopathy, bipolar disorder, tardive dyskinesia - CT and MRI brain negative - EEG negative - TSH, B12, ammonia and thiamine  levels normal - RPR nonreactive - Etiology for delirium undetermined - Patient is now much more oriented and has capacity - Currently receiving benztropine , Depakote , Cymbalta , Ingrezza  and as needed Haldol  which he is not needing    Active Problems: Right knee pain- 1/14 - She states has been going on for about 2 weeks and is limiting his ambulation -Continue Voltaren      Type 2 diabetes mellitus with hyperglycemia, without long-term current use of insulin  - Continue metformin      Essential hypertension - Continue Inderal    Chronic tremor of arms and hands   Insomnia - Trazodone    Disposition: Waiting on memory care Healthcare POA paperwork in progress he wants his aunt to be his POA Appreciate chaplain and TOC     Wound 05/05/24 2200 Pressure Injury Coccyx Mid;Left;Right Stage 1 -  Intact skin with non-blanchable redness of a localized area usually over a bony prominence. (Active)      Nutrition Problem: Increased nutrient needs Etiology: acute illness     Signs/Symptoms: estimated needs    Interventions: Ensure Enlive (each supplement provides 350kcal and 20 grams of protein), MVI  Estimated body mass index is 26.26 kg/m as calculated from the following:   Height as of this encounter: 5' 10 (1.778 m).  Weight as of this encounter: 83 kg.  DVT prophylaxis: Lovenox   code Status: Full code Family Communication: None at bedside  disposition Plan:  Status is: Inpatient Waiting on memory care   Consultants: None  Procedures: None Antimicrobials none Subjective:   Healthcare POA paperwork in progress.  No new  events overnight patient has been stable Objective: Vitals:   05/31/24 1954 06/01/24 0446 06/01/24 2014 06/02/24 0422  BP: 109/74 113/72 120/86 102/65  Pulse: 62 (!) 55 69 67  Resp: 18 18 17 17   Temp: 97.9 F (36.6 C) (!) 97.5 F (36.4 C) 97.8 F (36.6 C) 98.4 F (36.9 C)  TempSrc: Oral   Axillary  SpO2: 99% 95% 95% 96%  Weight:      Height:       No intake or output data in the 24 hours ending 06/02/24 0903 Filed Weights   03/07/24 2229 03/26/24 1715  Weight: 87.4 kg 83 kg    Examination:  General exam: Appears calm and comfortable  Respiratory system: Clear to auscultation. Respiratory effort normal. Cardiovascular system: Regular Gastrointestinal system: Abdomen is nondistended, soft and nontender. No organomegaly or masses felt. Normal bowel sounds heard. Extremities: No edema   Data Reviewed: I have personally reviewed following labs and imaging studies  CBC: Recent Labs  Lab 05/31/24 0437  WBC 8.7  HGB 15.1  HCT 43.3  MCV 86.9  PLT 177   Basic Metabolic Panel: Recent Labs  Lab 05/31/24 0437  NA 136  K 4.2  CL 99  CO2 30  GLUCOSE 97  BUN 16  CREATININE 0.93  CALCIUM  9.2   GFR: Estimated Creatinine Clearance: 88.3 mL/min (by C-G formula based on SCr of 0.93 mg/dL). Liver Function Tests: Recent Labs  Lab 05/31/24 0437  AST 19  ALT 18  ALKPHOS 43  BILITOT 0.5  PROT 5.9*  ALBUMIN  3.5   No results for input(s): LIPASE, AMYLASE in the last 168 hours. No results for input(s): AMMONIA in the last 168 hours. Coagulation Profile: No results for input(s): INR, PROTIME in the last 168 hours. Cardiac Enzymes: No results for input(s): CKTOTAL, CKMB, CKMBINDEX, TROPONINI in the last 168 hours. BNP (last 3 results) No results for input(s): PROBNP in the last 8760 hours. HbA1C: No results for input(s): HGBA1C in the last 72 hours. CBG: No results for input(s): GLUCAP in the last 168 hours. Lipid Profile: No results for  input(s): CHOL, HDL, LDLCALC, TRIG, CHOLHDL, LDLDIRECT in the last 72 hours. Thyroid  Function Tests: No results for input(s): TSH, T4TOTAL, FREET4, T3FREE, THYROIDAB in the last 72 hours. Anemia Panel: No results for input(s): VITAMINB12, FOLATE, FERRITIN, TIBC, IRON, RETICCTPCT in the last 72 hours. Sepsis Labs: No results for input(s): PROCALCITON, LATICACIDVEN in the last 168 hours.  No results found for this or any previous visit (from the past 240 hours).   Radiology Studies: No results found.  Scheduled Meds:  atorvastatin   20 mg Oral Daily   benztropine   0.5 mg Oral Daily   divalproex   625 mg Oral Q8H   DULoxetine   60 mg Oral BID   enoxaparin  (LOVENOX ) injection  40 mg Subcutaneous Q24H   hydrOXYzine   50 mg Oral QHS   multivitamin with minerals  1 tablet Oral Daily   propranolol   5 mg Oral BID   valbenazine   80 mg Oral Daily   Continuous Infusions:   LOS: 87 days    Almarie KANDICE Hoots, MD  06/02/2024, 9:03 AM    "

## 2024-06-03 DIAGNOSIS — G9340 Encephalopathy, unspecified: Secondary | ICD-10-CM | POA: Diagnosis not present

## 2024-06-03 NOTE — Plan of Care (Signed)

## 2024-06-03 NOTE — Progress Notes (Signed)
 " PROGRESS NOTE    [name] [name]  FMW:[id number] DOB: [birth year] DOA: 03/02/2024 PCP: [name], [name]   Brief Narrative: [phone] with h/o type 1 bipolar d/o, tardive dyskinesia, T2DM, frequent falls, and HLD who presented on 10/18 with AMS.  He was initially boarded in the ER and was eventually admitted for evaluation of encephalopathy on 10/24.  He is awaiting SNF placement.   10/12.  Patient has been living with a friend for the last 2 to 3 years, patient was having bizarre behavior so friend did not let him come back to the apartment. Patient was boarded in the emergency room since 10/12. 10/19, psychiatry consulted as patient was endorsing suicidal thoughts-IVC 10/22, psychiatry cleared for discharge. 10/24, patient was noted to be encephalopathic so admitted to the hospital.  Multiple investigations were negative. 11/4, MRI under sedation, transient hypotension and altered mental status-overnight in ICU. 11/5, clinically improved.  Sedation wore off.  Transferred back to MedSurg bed.   11/18.  Persistent hypertension as well as multiple comorbidities, ongoing encephalopathy and patient was made DNR with two-physician consent. APS was involved although declined guardianship somewhere in December 2025. Referrals were sent for SNF in December 2025 05/16/2024 assessment shows patient has capacity.  Patient wanted to be full code. 1/8 started on Cogentin  1 mg twice daily 1/9 LFT normal.  Ammonia 35, Depakote  level 76. 1/12 Cogentin  dose reduced to 0.5 mg twice daily due to excessive drowsiness. 1/21 saw patient at bedside he thinks he just peed and wants to be cleaned   Waiting for [facility] to evaluate patient. TB skin test placed on 1/16. No induration. Assessment & Plan:   Principal Problem:   Acute encephalopathy Active Problems:   Type 2 diabetes mellitus with hyperglycemia, without long-term current use of insulin  (HCC)   Essential hypertension   Mixed  hyperlipidemia   Tremor   Tardive dyskinesia   Acute delirium   Bipolar 1 disorder (HCC)   Goals of care, counseling/discussion   Pressure injury of skin   DNR (do not resuscitate)   Increased nutritional needs  Acute metabolic encephalopathy, bipolar disorder, tardive dyskinesia-workup showed CT MRI of the brain was negative EEG was negative TSH B12 ammonia thiamine  levels were normal RPR was nonreactive.  Etiology of delirium was undetermined.Currently receiving benztropine , Depakote , Cymbalta , Ingrezza  and as needed Haldol .  His mental status is improved.  He wants to make his aunt as his healthcare power of attorney.  Active Problems: Right knee pain- 1/14 - She states has been going on for about 2 weeks and is limiting his ambulation -Continue Voltaren      Type 2 diabetes mellitus with hyperglycemia, without long-term current use of insulin  - Continue metformin      Essential hypertension - Continue Inderal    Chronic tremor of arms and hands   Insomnia - Trazodone    Disposition: Waiting on memory care Healthcare POA paperwork in progress he wants his aunt to be his POA Appreciate chaplain and TOC     Wound 05/05/24 2200 Pressure Injury Coccyx Mid;Left;Right Stage 1 -  Intact skin with non-blanchable redness of a localized area usually over a bony prominence. (Active)      Nutrition Problem: Increased nutrient needs Etiology: acute illness     Signs/Symptoms: estimated needs    Interventions: Ensure Enlive (each supplement provides 350kcal and 20 grams of protein), MVI  Estimated body mass index is 26.26 kg/m as calculated from the following:   Height as of this encounter: 5'  10 (1.778 m).   Weight as of this encounter: 83 kg.  DVT prophylaxis: Lovenox   code Status: Full code Family Communication: None at bedside  disposition Plan:  Status is: Inpatient Waiting on memory care   Consultants: None  Procedures: None Antimicrobials none Subjective: No  new events Objective: Vitals:   06/02/24 0940 06/02/24 0941 06/02/24 2109 06/03/24 0535  BP: 109/71 109/71 124/78 123/79  Pulse: 61 62 63 (!) 56  Resp:  18 17 17   Temp:  98.5 F (36.9 C) 98.3 F (36.8 C) (!) 97.4 F (36.3 C)  TempSrc:   Oral Oral  SpO2:  97% 99% 100%  Weight:      Height:        Intake/Output Summary (Last 24 hours) at 06/03/2024 1100 Last data filed at 06/03/2024 0500 Gross per 24 hour  Intake 600 ml  Output 1600 ml  Net -1000 ml   Filed Weights   03/07/24 2229 03/26/24 1715  Weight: 87.4 kg 83 kg    Examination:  General exam: Appears calm and comfortable  Respiratory system: Clear to auscultation. Respiratory effort normal. Cardiovascular system: Regular Gastrointestinal system: Abdomen is nondistended, soft and nontender. No organomegaly or masses felt. Normal bowel sounds heard. Extremities: No edema   Data Reviewed: I have personally reviewed following labs and imaging studies  CBC: Recent Labs  Lab 05/31/24 0437  WBC 8.7  HGB 15.1  HCT 43.3  MCV 86.9  PLT 177   Basic Metabolic Panel: Recent Labs  Lab 05/31/24 0437  NA 136  K 4.2  CL 99  CO2 30  GLUCOSE 97  BUN 16  CREATININE 0.93  CALCIUM  9.2   GFR: Estimated Creatinine Clearance: 88.3 mL/min (by C-G formula based on SCr of 0.93 mg/dL). Liver Function Tests: Recent Labs  Lab 05/31/24 0437  AST 19  ALT 18  ALKPHOS 43  BILITOT 0.5  PROT 5.9*  ALBUMIN  3.5   No results for input(s): LIPASE, AMYLASE in the last 168 hours. No results for input(s): AMMONIA in the last 168 hours. Coagulation Profile: No results for input(s): INR, PROTIME in the last 168 hours. Cardiac Enzymes: No results for input(s): CKTOTAL, CKMB, CKMBINDEX, TROPONINI in the last 168 hours. BNP (last 3 results) No results for input(s): PROBNP in the last 8760 hours. HbA1C: No results for input(s): HGBA1C in the last 72 hours. CBG: No results for input(s): GLUCAP in the last  168 hours. Lipid Profile: No results for input(s): CHOL, HDL, LDLCALC, TRIG, CHOLHDL, LDLDIRECT in the last 72 hours. Thyroid  Function Tests: No results for input(s): TSH, T4TOTAL, FREET4, T3FREE, THYROIDAB in the last 72 hours. Anemia Panel: No results for input(s): VITAMINB12, FOLATE, FERRITIN, TIBC, IRON, RETICCTPCT in the last 72 hours. Sepsis Labs: No results for input(s): PROCALCITON, LATICACIDVEN in the last 168 hours.  No results found for this or any previous visit (from the past 240 hours).   Radiology Studies: No results found.  Scheduled Meds:  atorvastatin   20 mg Oral Daily   benztropine   0.5 mg Oral Daily   divalproex   625 mg Oral Q8H   DULoxetine   60 mg Oral BID   enoxaparin  (LOVENOX ) injection  40 mg Subcutaneous Q24H   hydrOXYzine   50 mg Oral QHS   multivitamin with minerals  1 tablet Oral Daily   propranolol   5 mg Oral BID   valbenazine   80 mg Oral Daily   Continuous Infusions:   LOS: 88 days    Almarie KANDICE Hoots, MD  06/03/2024, 11:00  AM    "

## 2024-06-04 DIAGNOSIS — G9340 Encephalopathy, unspecified: Secondary | ICD-10-CM | POA: Diagnosis not present

## 2024-06-04 NOTE — TOC Progression Note (Addendum)
 Transition of Care [facility]) - Progression Note    Patient Details  Name: [name] [name] MRN: [id number] Date of Birth: Mar 28, 1966  [facility]) CM/SW Contact  Doneta Glenys DASEN, RN Phone Number: 06/04/2024, 8:05 AM  Clinical Narrative 4:02 PM CM received call from [facility] [phone] and updated on [facility] POA and concerns of ethical issue of have the patient complete insurance document without having a neutral/family person to assist patient. 8:05 AM CM call [facility], [address], [place], [place].[name] [phone]). Mr. [name] states that due to the weather the bed ordered has not arrived. Mr. Later will call CM later today with update on bed arrival.     Barriers to Discharge: SNF Pending bed offer               Expected Discharge Plan and Services                                               Social Drivers of Health (SDOH) Interventions SDOH Screenings   Food Insecurity: Patient Unable To Answer (03/07/2024)  Housing: Unknown (03/07/2024)  Transportation Needs: Patient Unable To Answer (03/07/2024)  Utilities: Patient Unable To Answer (03/07/2024)  Depression (PHQ2-9): High Risk (01/26/2024)  Social Connections: Unknown (09/22/2021)   Received from [facility]  Tobacco Use: Low Risk (03/13/2024)    Readmission Risk Interventions     No data to display

## 2024-06-04 NOTE — Progress Notes (Signed)
 " PROGRESS NOTE    [name] [name]  FMW:[id number] DOB: 07/21/1965 DOA: 03/02/2024 PCP: [name], [name]   Brief Narrative: [phone] with h/o type 1 bipolar d/o, tardive dyskinesia, T2DM, frequent falls, and HLD who presented on 10/18 with AMS.  He was initially boarded in the ER and was eventually admitted for evaluation of encephalopathy on 10/24.  He is awaiting SNF placement.  Patient has been living with a friend for the last 2 to 3 years he was having bizarre behaviors with a friend did not take him back to the apartment.  Psych was consulted during this hospital stay due to patient endorsing suicidal thoughts.  He was noted to be encephalopathic with negative workup.  Assessment on 7 January patient has capacity to make decisions.  He was started on Cogentin  1 mg twice daily.  Dose was decreased to 0.5 mg twice daily due to excessive drowsiness.  Waiting for memory care SNF placement.  Waiting for SNF placement.   TB skin test on the 16th with no induration.    Assessment & Plan:   Principal Problem:   Acute encephalopathy Active Problems:   Type 2 diabetes mellitus with hyperglycemia, without long-term current use of insulin  (HCC)   Essential hypertension   Mixed hyperlipidemia   Tremor   Tardive dyskinesia   Acute delirium   Bipolar 1 disorder (HCC)   Goals of care, counseling/discussion   Pressure injury of skin   DNR (do not resuscitate)   Increased nutritional needs  Acute metabolic encephalopathy, bipolar disorder, tardive dyskinesia-workup showed CT MRI of the brain was negative EEG was negative TSH B12 ammonia thiamine  levels were normal RPR was nonreactive.  Etiology of delirium was undetermined.Currently receiving benztropine , Depakote , Cymbalta , Ingrezza  and as needed Haldol .  His mental status is improved.  He wants to make his aunt as his healthcare power of attorney.  Active Problems: Right knee pain- 1/14 - She states has been going on for about 2 weeks and is  limiting his ambulation -Continue Voltaren      Type 2 diabetes mellitus with hyperglycemia, without long-term current use of insulin  - Continue metformin      Essential hypertension - Continue Inderal    Chronic tremor of arms and hands stable   Insomnia - Trazodone    Disposition: Waiting on memory care Healthcare POA paperwork in progress he wants his aunt to be his POA Appreciate chaplain and TOC     Wound 05/05/24 2200 Pressure Injury Coccyx Mid;Left;Right Stage 1 -  Intact skin with non-blanchable redness of a localized area usually over a bony prominence. (Active)      Nutrition Problem: Increased nutrient needs Etiology: acute illness     Signs/Symptoms: estimated needs    Interventions: Ensure Enlive (each supplement provides 350kcal and 20 grams of protein), MVI  Estimated body mass index is 26.26 kg/m as calculated from the following:   Height as of this encounter: 5' 10 (1.778 m).   Weight as of this encounter: 83 kg.  DVT prophylaxis: Lovenox   code Status: Full code Family Communication: None at bedside  disposition Plan:  Status is: Inpatient Waiting on memory care   Consultants: None  Procedures: None Antimicrobials none Subjective:   Patient resting in bed no overnight events Objective: Vitals:   06/03/24 0535 06/03/24 1411 06/03/24 2013 06/04/24 0424  BP: 123/79 106/79 100/71 106/66  Pulse: (!) 56 75 60 (!) 56  Resp: 17 18 18 18   Temp: (!) 97.4 F (36.3 C) 97.8 F (36.6 C)  98.7 F (37.1 C) (!) 97.5 F (36.4 C)  TempSrc: Oral Oral Oral Oral  SpO2: 100% 94% 96% 93%  Weight:      Height:        Intake/Output Summary (Last 24 hours) at 06/04/2024 0853 Last data filed at 06/03/2024 1500 Gross per 24 hour  Intake 240 ml  Output --  Net 240 ml   Filed Weights   03/07/24 2229 03/26/24 1715  Weight: 87.4 kg 83 kg    Examination:  General exam: Appears in nad Respiratory system: Clear to auscultation. Respiratory effort  normal. Cardiovascular system: Regular Gastrointestinal system: Abdomen is nondistended, soft and nontender. No organomegaly or masses felt. Normal bowel sounds heard. Extremities: No edema   Data Reviewed: I have personally reviewed following labs and imaging studies  CBC: Recent Labs  Lab 05/31/24 0437  WBC 8.7  HGB 15.1  HCT 43.3  MCV 86.9  PLT 177   Basic Metabolic Panel: Recent Labs  Lab 05/31/24 0437  NA 136  K 4.2  CL 99  CO2 30  GLUCOSE 97  BUN 16  CREATININE 0.93  CALCIUM  9.2   GFR: Estimated Creatinine Clearance: 88.3 mL/min (by C-G formula based on SCr of 0.93 mg/dL). Liver Function Tests: Recent Labs  Lab 05/31/24 0437  AST 19  ALT 18  ALKPHOS 43  BILITOT 0.5  PROT 5.9*  ALBUMIN  3.5   No results for input(s): LIPASE, AMYLASE in the last 168 hours. No results for input(s): AMMONIA in the last 168 hours. Coagulation Profile: No results for input(s): INR, PROTIME in the last 168 hours. Cardiac Enzymes: No results for input(s): CKTOTAL, CKMB, CKMBINDEX, TROPONINI in the last 168 hours. BNP (last 3 results) No results for input(s): PROBNP in the last 8760 hours. HbA1C: No results for input(s): HGBA1C in the last 72 hours. CBG: No results for input(s): GLUCAP in the last 168 hours. Lipid Profile: No results for input(s): CHOL, HDL, LDLCALC, TRIG, CHOLHDL, LDLDIRECT in the last 72 hours. Thyroid  Function Tests: No results for input(s): TSH, T4TOTAL, FREET4, T3FREE, THYROIDAB in the last 72 hours. Anemia Panel: No results for input(s): VITAMINB12, FOLATE, FERRITIN, TIBC, IRON, RETICCTPCT in the last 72 hours. Sepsis Labs: No results for input(s): PROCALCITON, LATICACIDVEN in the last 168 hours.  No results found for this or any previous visit (from the past 240 hours).   Radiology Studies: No results found.  Scheduled Meds:  atorvastatin   20 mg Oral Daily   benztropine   0.5 mg  Oral Daily   divalproex   625 mg Oral Q8H   DULoxetine   60 mg Oral BID   enoxaparin  (LOVENOX ) injection  40 mg Subcutaneous Q24H   hydrOXYzine   50 mg Oral QHS   multivitamin with minerals  1 tablet Oral Daily   propranolol   5 mg Oral BID   valbenazine   80 mg Oral Daily   Continuous Infusions:   LOS: 89 days    Almarie KANDICE Hoots, MD  06/04/2024, 8:53 AM    "

## 2024-06-04 NOTE — Plan of Care (Signed)
educated

## 2024-06-05 DIAGNOSIS — G9340 Encephalopathy, unspecified: Secondary | ICD-10-CM | POA: Diagnosis not present

## 2024-06-05 NOTE — TOC Progression Note (Incomplete Revision)
 Transition of Care [facility]) - Progression Note    Patient Details  Name: [name] [name] MRN: [id number] Date of Birth: 12/08/1965  Transition of Care [facility]) CM/SW Contact  Doneta Glenys DASEN, RN Phone Number: 06/05/2024, 8:53 AM  Clinical Narrative:    2:28 PM CM called MTM [phone] for transportation. MTM give another number to call [phone]. CM sent DME referral via HUB to [employer] 1:54 PM CM called Jill Schachner [facility]) [phone] to get information for transportation and DME.[employer] for  transportation use MTM [phone] and Camelia Evan and [employer] for DME. 1:39 PM CM called [name] for update. [name] states that the bed will not be delivered til Thursday (1/29). CM will reach out to insurance to assist with mobility equipment and transportation. Will call insurance transport for  10:50 AM CM called [name] for update. [name] will call CM back. 8:553 AM CM called [name] [phone]) - was not able to leave message, d/t mail box full. Will try later.     Barriers to Discharge: SNF Pending bed offer               Expected Discharge Plan and Services                                               Social Drivers of Health (SDOH) Interventions SDOH Screenings   Food Insecurity: Patient Unable To Answer (03/07/2024)  Housing: Unknown (03/07/2024)  Transportation Needs: Patient Unable To Answer (03/07/2024)  Utilities: Patient Unable To Answer (03/07/2024)  Depression (PHQ2-9): High Risk (01/26/2024)  Social Connections: Unknown (09/22/2021)   Received from [facility]  Tobacco Use: Low Risk (03/13/2024)    Readmission Risk Interventions     No data to display

## 2024-06-05 NOTE — Plan of Care (Signed)
   Problem: Health Behavior/Discharge Planning: Goal: Ability to manage health-related needs will improve Outcome: Progressing   Problem: Clinical Measurements: Goal: Ability to maintain clinical measurements within normal limits will improve Outcome: Progressing Goal: Will remain free from infection Outcome: Progressing Goal: Diagnostic test results will improve Outcome: Progressing Goal: Respiratory complications will improve Outcome: Progressing

## 2024-06-05 NOTE — TOC Progression Note (Addendum)
 Transition of Care [facility]) - Progression Note    Patient Details  Name: [name] [name] MRN: [id number] Date of Birth: 12/08/1965  Transition of Care [facility]) CM/SW Contact  Doneta Glenys DASEN, RN Phone Number: 06/05/2024, 8:53 AM  Clinical Narrative:    2:28 PM CM called MTM [phone] for transportation. MTM give another number to call [phone]. CM sent DME referral via HUB to [employer] 1:54 PM CM called Jill Schachner [facility]) [phone] to get information for transportation and DME.[employer] for  transportation use MTM [phone] and Camelia [employer] and [employer] for DME. 1:39 PM CM called [name] for update. [name] states that the bed will not be delivered til Thursday (1/29). CM will reach out to insurance to assist with mobility equipment and transportation. Will call insurance transport for  10:50 AM CM called [name] for update. [name] will call CM back. 8:553 AM CM called [name] [phone]) - was not able to leave message, d/t mail box full. Will try later.     Barriers to Discharge: SNF Pending bed offer               Expected Discharge Plan and Services                                               Social Drivers of Health (SDOH) Interventions SDOH Screenings   Food Insecurity: Patient Unable To Answer (03/07/2024)  Housing: Unknown (03/07/2024)  Transportation Needs: Patient Unable To Answer (03/07/2024)  Utilities: Patient Unable To Answer (03/07/2024)  Depression (PHQ2-9): High Risk (01/26/2024)  Social Connections: Unknown (09/22/2021)   Received from [facility]  Tobacco Use: Low Risk (03/13/2024)    Readmission Risk Interventions     No data to display

## 2024-06-05 NOTE — Progress Notes (Signed)
 " PROGRESS NOTE    [name] [name]  FMW:[id number] DOB: [birth year] DOA: 03/02/2024 PCP: [name], [name]   Brief Narrative: [phone] with h/o type 1 bipolar d/o, tardive dyskinesia, T2DM, frequent falls, and HLD who presented on 10/18 with AMS.  He was initially boarded in the ER and was eventually admitted for evaluation of encephalopathy on 10/24.  He is awaiting SNF placement.  Patient has been living with a friend for the last 2 to 3 years he was having bizarre behaviors with a friend did not take him back to the apartment.  Psych was consulted during this hospital stay due to patient endorsing suicidal thoughts.  He was noted to be encephalopathic with negative workup.  Assessment on 7 January patient has capacity to make decisions.  He was started on Cogentin  1 mg twice daily.  Dose was decreased to 0.5 mg twice daily due to excessive drowsiness.  Waiting for memory care SNF placement.  Waiting for SNF placement.   TB skin test on the 16th with no induration.    Assessment & Plan:   Principal Problem:   Acute encephalopathy Active Problems:   Type 2 diabetes mellitus with hyperglycemia, without long-term current use of insulin  (HCC)   Essential hypertension   Mixed hyperlipidemia   Tremor   Tardive dyskinesia   Acute delirium   Bipolar 1 disorder (HCC)   Goals of care, counseling/discussion   Pressure injury of skin   DNR (do not resuscitate)   Increased nutritional needs  Acute metabolic encephalopathy, bipolar disorder, tardive dyskinesia-workup showed CT MRI of the brain was negative EEG was negative TSH B12 ammonia thiamine  levels were normal RPR was nonreactive.  Etiology of delirium was undetermined.Currently receiving benztropine , Depakote , Cymbalta , Ingrezza  and as needed Haldol .  His mental status is improved.  He made his aunt as his healthcare power of attorney.  Active Problems: Right knee pain- -Continue Voltaren      Type 2 diabetes mellitus with hyperglycemia,  without long-term current use of insulin  - Continue metformin      Essential hypertension - Continue Inderal    Chronic tremor of arms and hands stable   Insomnia - Trazodone    Disposition: Waiting on memory care Healthcare POA paperwork in progress he wants his aunt to be his POA Appreciate chaplain and TOC     Wound 05/05/24 2200 Pressure Injury Coccyx Mid;Left;Right Stage 1 -  Intact skin with non-blanchable redness of a localized area usually over a bony prominence. (Active)      Nutrition Problem: Increased nutrient needs Etiology: acute illness     Signs/Symptoms: estimated needs    Interventions: Ensure Enlive (each supplement provides 350kcal and 20 grams of protein), MVI  Estimated body mass index is 26.26 kg/m as calculated from the following:   Height as of this encounter: 5' 10 (1.778 m).   Weight as of this encounter: 83 kg.  DVT prophylaxis: Lovenox   code Status: Full code Family Communication: None at bedside  disposition Plan:  Status is: Inpatient Waiting on memory care   Consultants: None  Procedures: None Antimicrobials none Subjective:  No overnight events He is resting with eyes closed Objective: Vitals:   06/04/24 1500 06/04/24 1700 06/04/24 1927 06/05/24 0519  BP: 96/70 103/68 104/62 101/61  Pulse: 63  63 (!) 50  Resp: 13 15 16 14   Temp: 98.3 F (36.8 C)  98.1 F (36.7 C) 97.7 F (36.5 C)  TempSrc: Oral  Oral   SpO2:  94% 94% 99%  Weight:  Height:        Intake/Output Summary (Last 24 hours) at 06/05/2024 0744 Last data filed at 06/05/2024 0555 Gross per 24 hour  Intake --  Output 425 ml  Net -425 ml   Filed Weights   03/07/24 2229 03/26/24 1715  Weight: 87.4 kg 83 kg    Examination:  General exam: Appears in nad Respiratory system: Clear to auscultation. Respiratory effort normal. Cardiovascular system: Regular Gastrointestinal system: Abdomen is nondistended, soft and nontender. No organomegaly or masses  felt. Normal bowel sounds heard. Extremities: No edema   Data Reviewed: I have personally reviewed following labs and imaging studies  CBC: Recent Labs  Lab 05/31/24 0437  WBC 8.7  HGB 15.1  HCT 43.3  MCV 86.9  PLT 177   Basic Metabolic Panel: Recent Labs  Lab 05/31/24 0437  NA 136  K 4.2  CL 99  CO2 30  GLUCOSE 97  BUN 16  CREATININE 0.93  CALCIUM  9.2   GFR: Estimated Creatinine Clearance: 88.3 mL/min (by C-G formula based on SCr of 0.93 mg/dL). Liver Function Tests: Recent Labs  Lab 05/31/24 0437  AST 19  ALT 18  ALKPHOS 43  BILITOT 0.5  PROT 5.9*  ALBUMIN  3.5   No results for input(s): LIPASE, AMYLASE in the last 168 hours. No results for input(s): AMMONIA in the last 168 hours. Coagulation Profile: No results for input(s): INR, PROTIME in the last 168 hours. Cardiac Enzymes: No results for input(s): CKTOTAL, CKMB, CKMBINDEX, TROPONINI in the last 168 hours. BNP (last 3 results) No results for input(s): PROBNP in the last 8760 hours. HbA1C: No results for input(s): HGBA1C in the last 72 hours. CBG: No results for input(s): GLUCAP in the last 168 hours. Lipid Profile: No results for input(s): CHOL, HDL, LDLCALC, TRIG, CHOLHDL, LDLDIRECT in the last 72 hours. Thyroid  Function Tests: No results for input(s): TSH, T4TOTAL, FREET4, T3FREE, THYROIDAB in the last 72 hours. Anemia Panel: No results for input(s): VITAMINB12, FOLATE, FERRITIN, TIBC, IRON, RETICCTPCT in the last 72 hours. Sepsis Labs: No results for input(s): PROCALCITON, LATICACIDVEN in the last 168 hours.  No results found for this or any previous visit (from the past 240 hours).   Radiology Studies: No results found.  Scheduled Meds:  atorvastatin   20 mg Oral Daily   benztropine   0.5 mg Oral Daily   divalproex   625 mg Oral Q8H   DULoxetine   60 mg Oral BID   enoxaparin  (LOVENOX ) injection  40 mg Subcutaneous Q24H    hydrOXYzine   50 mg Oral QHS   multivitamin with minerals  1 tablet Oral Daily   propranolol   5 mg Oral BID   valbenazine   80 mg Oral Daily   Continuous Infusions:   LOS: 90 days    Almarie KANDICE Hoots, MD  06/05/2024, 7:44 AM    "

## 2024-06-05 NOTE — Plan of Care (Signed)

## 2024-06-06 DIAGNOSIS — G9340 Encephalopathy, unspecified: Secondary | ICD-10-CM | POA: Diagnosis not present

## 2024-06-06 NOTE — Assessment & Plan Note (Addendum)
 Acute metabolic encephalopathy thought to be multifactorial given underlying cognitive impairment complicated by dehydration, and acute delirium on presentation CTA and MRI brain negative for acute findings EEG negative x 3 Unremarkable labs Delirium and fall precautions with frequent reorientation Psychiatry consulted and has signed off Continue with Depakote , duloxetine , hydroxyzine , propranolol , valbenazine  as currently prescribed Will need close outpatient follow-up with psychiatry As needed Haldol 

## 2024-06-06 NOTE — Assessment & Plan Note (Signed)
 Continue statin  Continue statin

## 2024-06-06 NOTE — TOC Progression Note (Signed)
 Transition of Care [facility]) - Progression Note    Patient Details  Name: [name] [name] MRN: [id number] Date of Birth: 01-Mar-1966  [facility] CM/SW Contact  Doneta Glenys DASEN, RN Phone Number: 06/06/2024, 3:46 PM  Clinical Narrative:    CM called to Aunt [name] - left HIPAA complaint voicemail requesting callback. CM called [employer] [phone] to schedule transportation to; [facility] [address] [place], [place] [address] [employer] [place] [phone]) Trip ID# - [id number]     Barriers to Discharge: SNF Pending bed offer               Expected Discharge Plan and Services                                               Social Drivers of Health (SDOH) Interventions SDOH Screenings   Food Insecurity: Patient Unable To Answer (03/07/2024)  Housing: Unknown (03/07/2024)  Transportation Needs: Patient Unable To Answer (03/07/2024)  Utilities: Patient Unable To Answer (03/07/2024)  Depression (PHQ2-9): High Risk (01/26/2024)  Social Connections: Unknown (09/22/2021)   Received from [facility]  Tobacco Use: Low Risk (03/13/2024)    Readmission Risk Interventions     No data to display

## 2024-06-06 NOTE — Plan of Care (Signed)
   Problem: Health Behavior/Discharge Planning: Goal: Ability to manage health-related needs will improve Outcome: Progressing   Problem: Clinical Measurements: Goal: Ability to maintain clinical measurements within normal limits will improve Outcome: Progressing Goal: Will remain free from infection Outcome: Progressing

## 2024-06-06 NOTE — Progress Notes (Signed)
 " Progress Note   Patient: [name] [name] FMW:[id number] DOB: 1965/09/23 DOA: 03/02/2024     60 DOS: the patient was seen and examined on 06/06/2024   Brief hospital course: 59yo with h/o type 1 bipolar d/o, tardive dyskinesia, T2DM, frequent falls, and HLD who presented on 10/18 with AMS. He was initially boarded in the ER and was eventually admitted for evaluation of encephalopathy on 10/24. He lacked capacity early on but the state denied guardianship and he now has capacity. He is awaiting group home placement, currently scheduled for 1/29.      Assessment & Plan Acute encephalopathy Acute delirium Bipolar 1 disorder (HCC) Tardive dyskinesia Acute metabolic encephalopathy thought to be multifactorial given underlying cognitive impairment complicated by dehydration, and acute delirium on presentation CTA and MRI brain negative for acute findings EEG negative x 3 Unremarkable labs Delirium and fall precautions with frequent reorientation Psychiatry consulted and has signed off Continue with Depakote , duloxetine , hydroxyzine , propranolol , valbenazine  as currently prescribed Will need close outpatient follow-up with psychiatry As needed Haldol  Tremor This is bothersome to the patient He has been started on propranolol  and appears to be tolerating it well Pressure injury of skin Wound 05/05/24 2200 Pressure Injury Coccyx Mid;Left;Right Stage 1 -  Intact skin with non-blanchable redness of a localized area usually over a bony prominence. (Active)  Not POA Type 2 diabetes mellitus with hyperglycemia, without long-term current use of insulin  (HCC) Hemoglobin A1c 6.3%, good control Continue metformin   Mixed hyperlipidemia Continue statin  Essential hypertension Initially low BPs secondary to sedation BP normalized, currently 126/85 TSH WNL in October Propranolol  intermittently being held Increased nutritional needs Nutrition Problem: Increased nutrient needs Etiology: acute  illness Signs/Symptoms: estimated needs Interventions: Ensure Enlive (each supplement provides 350kcal and 20 grams of protein), MVI DNR (do not resuscitate) Goals of care, counseling/discussion Had two-physician signed DNR early on when he lacked capacity Palliative care was also involved in the care at that time and has followed the patient throughout this hospitalization  APS was involved, but guardianship was denied  At this time, he is awaiting LTC placement He is scheduled to go to a group home 1/29 with [name]      Consultants: Psychiatry Neurology Pulmonology Palliative Care Dietician PT OT ICM team   Procedures: EEG 10/30   Antibiotics: Augmentin  11/3-8  30 Day Unplanned Readmission Risk Score    Flowsheet Row ED to Hosp-Admission (Current) from 03/02/2024 in [facility]  30 Day Unplanned Readmission Risk Score (%) 25.85 Filed at 06/06/2024 0801    This score is the patient's risk of an unplanned readmission within 30 days of being discharged (0 -100%). The score is based on dignosis, age, lab data, medications, orders, and past utilization.   Low:  0-14.9   Medium: 15-21.9   High: 22-29.9   Extreme: 30 and above           Subjective: No complaints.  He is unhappy about placement and reports that he likes it at the hospital and wants to stay here.   Objective: Vitals:   06/05/24 2244 06/06/24 0439  BP: 97/62 114/71  Pulse: 62 (!) 54  Resp:  18  Temp:  97.7 F (36.5 C)  SpO2:  96%    Intake/Output Summary (Last 24 hours) at 06/06/2024 0937 Last data filed at 06/05/2024 1744 Gross per 24 hour  Intake 120 ml  Output --  Net 120 ml   Filed Weights   03/07/24 2229 03/26/24  1715  Weight: 87.4 kg 83 kg    Exam:  General:  Appears calm and comfortable and is in NAD Eyes:  normal lids, iris ENT:  grossly normal hearing, lips & tongue, mmm Cardiovascular:  RRR. No LE edema.  Respiratory:   CTA  bilaterally with no wheezes/rales/rhonchi.  Normal respiratory effort. Abdomen:  soft, NT, ND Skin:  no rash or induration seen on limited exam Musculoskeletal:  grossly normal tone BUE/BLE, good ROM, no bony abnormality Psychiatric: blunted mood and affect, speech fluent and appropriate Neurologic:  CN 2-12 grossly intact, moves all extremities in coordinated fashion  Data Reviewed: I have reviewed the patient's lab results since admission.  Pertinent labs for today include:   Labs ok at last check on 1/22     Family Communication: None  Mobility: PT/OT Consulted and are recommending - Recommend Snf, Barriers To Snf Placement - Toc To F/U With Patient/Family For D/C Plans1/12/2024 1553    Code Status: Full Code   Disposition: Status is: Inpatient Remains inpatient appropriate because: awaiting placement     Time spent: 35 minutes  Unresulted Labs (From admission, onward)    None        Author: Delon Herald, MD 06/06/2024 9:37 AM  For on call review [email or web address].            "

## 2024-06-06 NOTE — Assessment & Plan Note (Addendum)
 Had two-physician signed DNR early on when he lacked capacity Palliative care was also involved in the care at that time and has followed the patient throughout this hospitalization  APS was involved, but guardianship was denied  At this time, he is awaiting LTC placement He is scheduled to go to a group home 1/29 with [name]

## 2024-06-06 NOTE — Plan of Care (Signed)

## 2024-06-06 NOTE — Assessment & Plan Note (Addendum)
 This is bothersome to the patient He has been started on propranolol  and appears to be tolerating it well

## 2024-06-06 NOTE — Assessment & Plan Note (Signed)
 Nutrition Problem: Increased nutrient needs Etiology: acute illness Signs/Symptoms: estimated needs Interventions: Ensure Enlive (each supplement provides 350kcal and 20 grams of protein), MVI

## 2024-06-06 NOTE — Assessment & Plan Note (Signed)
 Hemoglobin A1c 6.3%, good control Continue metformin 

## 2024-06-06 NOTE — Assessment & Plan Note (Signed)
 Wound 05/05/24 2200 Pressure Injury Coccyx Mid;Left;Right Stage 1 -  Intact skin with non-blanchable redness of a localized area usually over a bony prominence. (Active)  Not POA

## 2024-06-06 NOTE — Assessment & Plan Note (Signed)
 Initially low BPs secondary to sedation BP normalized, currently 126/85 TSH WNL in October Propranolol  intermittently being held

## 2024-06-06 NOTE — Progress Notes (Signed)
 Mobility Specialist - Progress Note:   06/06/24 1619  Mobility  Activity Ambulated with assistance  Level of Assistance Minimal assist, patient does 75% or more  Assistive Device Front wheel walker  Distance Ambulated (ft) 205 ft  Activity Response Tolerated well  Mobility Referral Yes  Mobility visit 1 Mobility  Mobility Specialist Start Time (ACUTE ONLY) 1551  Mobility Specialist Stop Time (ACUTE ONLY) 1604  Mobility Specialist Time Calculation (min) (ACUTE ONLY) 13 min   Pt was received in bed and agreed to mobility. Min A bed mobility and sit to stand. No complaints/issues during ambulation. Returned to bed with all needs met. Call bell in reach and bed alarm on.  [employer] - Mobility Specialist - Acute Rehabilitation Can be reached via [employer]

## 2024-06-07 ENCOUNTER — Other Ambulatory Visit (HOSPITAL_COMMUNITY): Payer: Self-pay

## 2024-06-07 DIAGNOSIS — G9340 Encephalopathy, unspecified: Secondary | ICD-10-CM | POA: Diagnosis not present

## 2024-06-07 MED ORDER — HYDROXYZINE HCL 50 MG PO TABS
50.0000 mg | ORAL_TABLET | Freq: Every day | ORAL | 0 refills | Status: AC
  Filled 2024-06-07: qty 30, 30d supply, fill #0

## 2024-06-07 MED ORDER — DIVALPROEX SODIUM 500 MG PO DR TAB
500.0000 mg | DELAYED_RELEASE_TABLET | Freq: Three times a day (TID) | ORAL | 0 refills | Status: DC
  Filled 2024-06-07: qty 90, 30d supply, fill #0

## 2024-06-07 MED ORDER — BENZTROPINE MESYLATE 0.5 MG PO TABS
0.5000 mg | ORAL_TABLET | Freq: Every day | ORAL | 0 refills | Status: AC
  Filled 2024-06-07: qty 30, 30d supply, fill #0

## 2024-06-07 MED ORDER — HALOPERIDOL 5 MG PO TABS
5.0000 mg | ORAL_TABLET | Freq: Three times a day (TID) | ORAL | 0 refills | Status: AC | PRN
  Filled 2024-06-07: qty 30, 10d supply, fill #0

## 2024-06-07 MED ORDER — ACETAMINOPHEN 325 MG PO TABS: 650.0000 mg | ORAL_TABLET | Freq: Four times a day (QID) | ORAL | Status: AC | PRN

## 2024-06-07 MED ORDER — VALBENAZINE TOSYLATE 80 MG PO CAPS: 80.0000 mg | ORAL_CAPSULE | Freq: Every day | ORAL | 0 refills | Status: AC

## 2024-06-07 MED ORDER — VALBENAZINE TOSYLATE 80 MG PO CAPS
80.0000 mg | ORAL_CAPSULE | Freq: Every day | ORAL | 0 refills | Status: DC
  Filled 2024-06-07: qty 30, 30d supply, fill #0

## 2024-06-07 MED ORDER — PROPRANOLOL HCL 10 MG PO TABS
5.0000 mg | ORAL_TABLET | Freq: Two times a day (BID) | ORAL | 0 refills | Status: AC
  Filled 2024-06-07: qty 60, 60d supply, fill #0

## 2024-06-07 MED ORDER — DIVALPROEX SODIUM 500 MG PO DR TAB: 500.0000 mg | DELAYED_RELEASE_TABLET | Freq: Three times a day (TID) | ORAL | 0 refills | Status: AC

## 2024-06-07 MED ORDER — DIVALPROEX SODIUM 125 MG PO DR TAB
625.0000 mg | DELAYED_RELEASE_TABLET | Freq: Three times a day (TID) | ORAL | 0 refills | Status: AC
  Filled 2024-06-07: qty 100, 7d supply, fill #0

## 2024-06-07 MED ORDER — ATORVASTATIN CALCIUM 20 MG PO TABS
20.0000 mg | ORAL_TABLET | Freq: Every day | ORAL | 0 refills | Status: AC
  Filled 2024-06-07: qty 30, 30d supply, fill #0

## 2024-06-07 MED ORDER — TRAZODONE HCL 50 MG PO TABS
50.0000 mg | ORAL_TABLET | Freq: Every evening | ORAL | 0 refills | Status: AC | PRN
  Filled 2024-06-07: qty 30, 30d supply, fill #0

## 2024-06-07 MED ORDER — METFORMIN HCL 500 MG PO TABS
500.0000 mg | ORAL_TABLET | Freq: Two times a day (BID) | ORAL | 0 refills | Status: AC
  Filled 2024-06-07: qty 60, 30d supply, fill #0

## 2024-06-07 MED ORDER — DULOXETINE HCL 60 MG PO CPEP
60.0000 mg | ORAL_CAPSULE | Freq: Two times a day (BID) | ORAL | 0 refills | Status: AC
  Filled 2024-06-07: qty 60, 30d supply, fill #0

## 2024-06-07 NOTE — Progress Notes (Signed)
 Discharge medications delivered to CHRISTELLA Kick RN, patients Primary Nurse.

## 2024-06-07 NOTE — Assessment & Plan Note (Addendum)
 Acute metabolic encephalopathy thought to be multifactorial given underlying cognitive impairment complicated by dehydration, and acute delirium on presentation CTA and MRI brain negative for acute findings EEG negative x 3 Unremarkable labs Delirium and fall precautions with frequent reorientation Psychiatry consulted and has signed off Continue with Depakote , duloxetine , hydroxyzine , propranolol , valbenazine  as currently prescribed Will need close outpatient follow-up with psychiatry As needed Haldol 

## 2024-06-07 NOTE — Assessment & Plan Note (Addendum)
 Wound 05/05/24 2200 Pressure Injury Coccyx Mid;Left;Right Stage 1 -  Intact skin with non-blanchable redness of a localized area usually over a bony prominence. (Active)  Not POA

## 2024-06-07 NOTE — Progress Notes (Signed)
 Assisted [name] [name] with understanding and filling out HPOA documents.  Documents are complete and notarized.  He has assigned his Wylie [place] to be his HCPOA.  A copy was placed in his chart and another was scanned to [employer] Documents. The original was returned to [name] and a copy was provided to his aunt (who also took the original at his request to do so).  [address], Bcc Pager, [phone]

## 2024-06-07 NOTE — NC FL2 (Signed)
 " [place]  MEDICAID [id number] LEVEL OF CARE FORM     IDENTIFICATION  Patient Name: [name] [name] Birthdate: 05-28-1965 Sex: male Admission Date (Current Location): 03/02/2024  [facility] and [place] Number:  Producer, Television/film/video and Address:  [facility],  [address] [place], [place] [address]      Provider Number: [id number]  Attending Physician Name and Address:  Barbarann Nest, MD  Relative Name and Phone Number:  [name] [phone]    Current Level of Care: Hospital Recommended Level of Care: Assisted Living Facility Prior Approval Number:    Date Approved/Denied:   PASRR Number: [id number] F Expires 07/17/2024  Discharge Plan: Other (Comment)    Current Diagnoses: Patient Active Problem List   Diagnosis Date Noted   Increased nutritional needs 05/10/2024   Pressure injury of skin 05/09/2024   DNR (do not resuscitate) 05/09/2024   Goals of care, counseling/discussion 03/27/2024   Counseling and coordination of care 03/27/2024   Palliative care encounter 03/27/2024   Acute delirium 03/08/2024   Generalized weakness 03/08/2024   Bipolar 1 disorder (HCC) 03/08/2024   Acute encephalopathy 03/07/2024   Tardive dyskinesia 02/29/2024   Physical exam, annual 12/08/2023   Chronic low back pain without sciatica 05/10/2023   Herpes simplex antibody positive 01/13/2023   Type 2 diabetes mellitus with hyperglycemia, without long-term current use of insulin  (HCC) 09/01/2022   Essential hypertension 09/01/2022   Mixed hyperlipidemia 09/01/2022   Tremor 09/01/2022   Bipolar I disorder, most recent episode depressed (HCC) 10/21/2021   Generalized anxiety disorder 01/23/2020   Moderate episode of recurrent major depressive disorder (HCC) 01/23/2020   Primary osteoarthritis of right knee 10/31/2019   MDD (major depressive disorder) 10/24/2019   PTSD (post-traumatic stress disorder) 10/24/2019    Orientation RESPIRATION BLADDER Height & Weight     Self, Situation,  Place  Normal Continent Weight: 83 kg Height:  5' 10 (177.8 cm)  BEHAVIORAL SYMPTOMS/MOOD NEUROLOGICAL BOWEL NUTRITION STATUS    Petit mal, Frequency (Rare) Continent Diet  AMBULATORY STATUS COMMUNICATION OF NEEDS Skin   Limited Assist Verbally Normal                       Personal Care Assistance Level of Assistance  Bathing, Feeding, Dressing Bathing Assistance: Limited assistance Feeding assistance: Limited assistance Dressing Assistance: Limited assistance     Functional Limitations Info  Sight, Hearing, Speech Sight Info: Adequate Hearing Info: Adequate Speech Info: Adequate    SPECIAL CARE FACTORS FREQUENCY  PT (By licensed PT), OT (By licensed OT)     PT Frequency: 2x weekly OT Frequency: 2x weekl            Contractures Contractures Info: Not present    Additional Factors Info  Code Status, Allergies Code Status Info: FULL Allergies Info: NKDA Psychotropic Info: Deparkote, Cymbalta , Atarax , Desyrel          Current Medications (06/07/2024):  This is the current hospital active medication list Current Facility-Administered Medications  Medication Dose Route Frequency Provider Last Rate Last Admin   acetaminophen  (TYLENOL ) tablet 650 mg  650 mg Oral Q6H PRN Amponsah, Prosper M, MD   650 mg at 05/23/24 0913   Or   acetaminophen  (TYLENOL ) suppository 650 mg  650 mg Rectal Q6H PRN Amponsah, Prosper M, MD       alum & mag hydroxide-simeth (MAALOX/MYLANTA) 200-200-20 MG/5ML suspension 30 mL  30 mL Oral Q6H PRN Patel, Pranav M, MD       artificial tears ophthalmic  solution 1 drop  1 drop Both Eyes PRN Regalado, Belkys A, MD       atorvastatin  (LIPITOR) tablet 20 mg  20 mg Oral Daily Mannie Pac T, DO   20 mg at 06/07/24 0839   benzocaine  (ORAJEL) 10 % mucosal gel   Mouth/Throat QID PRN Raenelle Coria, MD   Given at 03/18/24 2210   benztropine  (COGENTIN ) tablet 0.5 mg  0.5 mg Oral Daily Patel, Pranav M, MD   0.5 mg at 06/07/24 9160   bisacodyl  (DULCOLAX)  EC tablet 5 mg  5 mg Oral Daily PRN Amponsah, Prosper M, MD   5 mg at 04/15/24 0958   divalproex  (DEPAKOTE ) DR tablet 625 mg  625 mg Oral Q8H Patel, Pranav M, MD   625 mg at 06/07/24 0548   DULoxetine  (CYMBALTA ) DR capsule 60 mg  60 mg Oral BID Starkes-Perry, Takia S, [name]   60 mg at 06/07/24 9160   enoxaparin  (LOVENOX ) injection 40 mg  40 mg Subcutaneous Q24H Amponsah, Prosper M, MD   40 mg at 06/07/24 9160   guaiFENesin -dextromethorphan  (ROBITUSSIN DM) 100-10 MG/5ML syrup 5 mL  5 mL Oral Q4H PRN Barbarann Nest, MD   5 mL at 05/14/24 2114   haloperidol  (HALDOL ) tablet 5 mg  5 mg Oral Q8H PRN Jacquetta Sharlot GRADE, NP       Or   haloperidol  lactate (HALDOL ) injection 5 mg  5 mg Intravenous Q8H PRN Jacquetta Sharlot GRADE, NP       hydrOXYzine  (ATARAX ) tablet 50 mg  50 mg Oral QHS Regalado, Belkys A, MD   50 mg at 06/06/24 2131   lip balm (CARMEX) ointment   Topical PRN Regalado, Belkys A, MD   Given at 05/24/24 9090   multivitamin with minerals tablet 1 tablet  1 tablet Oral Daily Gonfa, Taye T, MD   1 tablet at 06/07/24 0840   ondansetron  (ZOFRAN ) tablet 4 mg  4 mg Oral Q6H PRN Amponsah, Prosper M, MD   4 mg at 05/16/24 1722   Or   ondansetron  (ZOFRAN ) injection 4 mg  4 mg Intravenous Q6H PRN Amponsah, Prosper M, MD       Oral care mouth rinse  15 mL Mouth Rinse PRN Gonfa, Taye T, MD       propranolol  (INDERAL ) tablet 5 mg  5 mg Oral BID Barbarann Nest, MD   5 mg at 06/06/24 2131   senna-docusate (Senokot-S) tablet 1 tablet  1 tablet Oral QHS PRN Lou Claretta HERO, MD       traZODone  (DESYREL ) tablet 50 mg  50 mg Oral QHS PRN Lord, Jamison Y, NP   50 mg at 06/05/24 2242   valbenazine  (INGREZZA ) capsule 80 mg  80 mg Oral Daily Mannie Pac T, DO   80 mg at 06/07/24 9160     Discharge Medications: Please see discharge summary for a list of discharge medications.  Relevant Imaging Results:  Relevant Lab Results:   Additional Information SSN [id number]  Doneta Glenys DASEN, RN     "

## 2024-06-07 NOTE — NC FL2 (Signed)
 " [place]  MEDICAID FL2 LEVEL OF CARE FORM     IDENTIFICATION  Patient Name: [name] [name] Birthdate: 04/25/1966 Sex: male Admission Date (Current Location): 03/02/2024  [facility] and [place] Number:  Producer, Television/film/video and Address:  [facility],  [address] [place], [place] [address]      Provider Number: [id number]  Attending Physician Name and Address:  Barbarann Nest, MD  Relative Name and Phone Number:  [name] [phone]    Current Level of Care: Hospital Recommended Level of Care: Other (Comment) (Group Home) Prior Approval Number:    Date Approved/Denied:   PASRR Number: [id number] F Expires 07/17/2024  Discharge Plan: Other (Comment)    Current Diagnoses: Patient Active Problem List   Diagnosis Date Noted   Increased nutritional needs 05/10/2024   Pressure injury of skin 05/09/2024   DNR (do not resuscitate) 05/09/2024   Goals of care, counseling/discussion 03/27/2024   Counseling and coordination of care 03/27/2024   Palliative care encounter 03/27/2024   Acute delirium 03/08/2024   Generalized weakness 03/08/2024   Bipolar 1 disorder (HCC) 03/08/2024   Acute encephalopathy 03/07/2024   Tardive dyskinesia 02/29/2024   Physical exam, annual 12/08/2023   Chronic low back pain without sciatica 05/10/2023   Herpes simplex antibody positive 01/13/2023   Type 2 diabetes mellitus with hyperglycemia, without long-term current use of insulin  (HCC) 09/01/2022   Essential hypertension 09/01/2022   Mixed hyperlipidemia 09/01/2022   Tremor 09/01/2022   Bipolar I disorder, most recent episode depressed (HCC) 10/21/2021   Generalized anxiety disorder 01/23/2020   Moderate episode of recurrent major depressive disorder (HCC) 01/23/2020   Primary osteoarthritis of right knee 10/31/2019   MDD (major depressive disorder) 10/24/2019   PTSD (post-traumatic stress disorder) 10/24/2019    Orientation RESPIRATION BLADDER Height & Weight     Self,  Situation, Place  Normal Continent Weight: 83 kg Height:  5' 10 (177.8 cm)  BEHAVIORAL SYMPTOMS/MOOD NEUROLOGICAL BOWEL NUTRITION STATUS    Petit mal, Frequency (Rare) Continent Diet  AMBULATORY STATUS COMMUNICATION OF NEEDS Skin   Limited Assist Verbally Normal                       Personal Care Assistance Level of Assistance  Bathing, Feeding, Dressing Bathing Assistance: Limited assistance Feeding assistance: Limited assistance Dressing Assistance: Limited assistance     Functional Limitations Info  Sight, Hearing, Speech Sight Info: Adequate Hearing Info: Adequate Speech Info: Adequate    SPECIAL CARE FACTORS FREQUENCY  PT (By licensed PT), OT (By licensed OT)     PT Frequency: 2x weekly OT Frequency: 2x weekl            Contractures Contractures Info: Not present    Additional Factors Info  Code Status, Allergies Code Status Info: FULL Allergies Info: NKDA Psychotropic Info: Deparkote, Cymbalta , Atarax , Desyrel          Current Medications (06/07/2024):  This is the current hospital active medication list Current Facility-Administered Medications  Medication Dose Route Frequency Provider Last Rate Last Admin   acetaminophen  (TYLENOL ) tablet 650 mg  650 mg Oral Q6H PRN Amponsah, Prosper M, MD   650 mg at 05/23/24 0913   Or   acetaminophen  (TYLENOL ) suppository 650 mg  650 mg Rectal Q6H PRN Amponsah, Prosper M, MD       alum & mag hydroxide-simeth (MAALOX/MYLANTA) 200-200-20 MG/5ML suspension 30 mL  30 mL Oral Q6H PRN Patel, Pranav M, MD       artificial tears  ophthalmic solution 1 drop  1 drop Both Eyes PRN Regalado, Belkys A, MD       atorvastatin  (LIPITOR) tablet 20 mg  20 mg Oral Daily Mannie Pac T, DO   20 mg at 06/07/24 9160   benzocaine  (ORAJEL) 10 % mucosal gel   Mouth/Throat QID PRN Raenelle Coria, MD   Given at 03/18/24 2210   benztropine  (COGENTIN ) tablet 0.5 mg  0.5 mg Oral Daily Patel, Pranav M, MD   0.5 mg at 06/07/24 9160   bisacodyl   (DULCOLAX) EC tablet 5 mg  5 mg Oral Daily PRN Amponsah, Prosper M, MD   5 mg at 04/15/24 0958   divalproex  (DEPAKOTE ) DR tablet 625 mg  625 mg Oral Q8H Patel, Pranav M, MD   625 mg at 06/07/24 0548   DULoxetine  (CYMBALTA ) DR capsule 60 mg  60 mg Oral BID Starkes-Perry, Takia S, [name]   60 mg at 06/07/24 9160   enoxaparin  (LOVENOX ) injection 40 mg  40 mg Subcutaneous Q24H Amponsah, Prosper M, MD   40 mg at 06/07/24 9160   guaiFENesin -dextromethorphan  (ROBITUSSIN DM) 100-10 MG/5ML syrup 5 mL  5 mL Oral Q4H PRN Barbarann Nest, MD   5 mL at 05/14/24 2114   haloperidol  (HALDOL ) tablet 5 mg  5 mg Oral Q8H PRN Jacquetta Sharlot GRADE, NP       Or   haloperidol  lactate (HALDOL ) injection 5 mg  5 mg Intravenous Q8H PRN Jacquetta Sharlot GRADE, NP       hydrOXYzine  (ATARAX ) tablet 50 mg  50 mg Oral QHS Regalado, Belkys A, MD   50 mg at 06/06/24 2131   lip balm (CARMEX) ointment   Topical PRN Regalado, Belkys A, MD   Given at 05/24/24 9090   multivitamin with minerals tablet 1 tablet  1 tablet Oral Daily Gonfa, Taye T, MD   1 tablet at 06/07/24 0840   ondansetron  (ZOFRAN ) tablet 4 mg  4 mg Oral Q6H PRN Amponsah, Prosper M, MD   4 mg at 05/16/24 1722   Or   ondansetron  (ZOFRAN ) injection 4 mg  4 mg Intravenous Q6H PRN Amponsah, Prosper M, MD       Oral care mouth rinse  15 mL Mouth Rinse PRN Gonfa, Taye T, MD       propranolol  (INDERAL ) tablet 5 mg  5 mg Oral BID Barbarann Nest, MD   5 mg at 06/06/24 2131   senna-docusate (Senokot-S) tablet 1 tablet  1 tablet Oral QHS PRN Lou Claretta HERO, MD       traZODone  (DESYREL ) tablet 50 mg  50 mg Oral QHS PRN Jacquetta Sharlot GRADE, NP   50 mg at 06/05/24 2242   valbenazine  (INGREZZA ) capsule 80 mg  80 mg Oral Daily Mannie Pac T, DO   80 mg at 06/07/24 9160     Discharge Medications: Please see discharge summary for a list of discharge medications.  Relevant Imaging Results:  Relevant Lab Results:   Additional Information SSN [id number]  Doneta Glenys DASEN,  RN     "

## 2024-06-07 NOTE — Assessment & Plan Note (Addendum)
 Had two-physician signed DNR early on when he lacked capacity Palliative care was also involved in the care at that time and has followed the patient throughout this hospitalization  APS was involved, but guardianship was denied  At this time, he is awaiting LTC placement He is scheduled to go to a group home 1/29 with [name]

## 2024-06-07 NOTE — Care Plan (Signed)
 Discharge medications and script delivered

## 2024-06-07 NOTE — Assessment & Plan Note (Addendum)
 Hemoglobin A1c 6.3%, good control Continue metformin 

## 2024-06-07 NOTE — Assessment & Plan Note (Addendum)
 Continue statin  Continue statin

## 2024-06-07 NOTE — Assessment & Plan Note (Addendum)
 Initially low BPs secondary to sedation BP normalized, currently 126/85 TSH WNL in October Propranolol  intermittently being held

## 2024-06-07 NOTE — Plan of Care (Signed)
   Problem: Health Behavior/Discharge Planning: Goal: Ability to manage health-related needs will improve Outcome: Progressing

## 2024-06-07 NOTE — TOC Progression Note (Addendum)
 Transition of Care [facility]) - Progression Note    Patient Details  Name: [name] [name] MRN: [id number] Date of Birth: May 01, 1966  [facility] CM/SW Contact  Doneta Glenys DASEN, RN Phone Number: 06/07/2024, 8:39 AM  Clinical Narrative:    [name] (aunt) returned call. CM updated [name] on patient change and need for [facility] POA. [name] said she will come to the hospital to complete. CM spoke with [name] to confirm patients arrival for today.     Barriers to Discharge: SNF Pending bed offer               Expected Discharge Plan and Services                                               Social Drivers of Health (SDOH) Interventions SDOH Screenings   Food Insecurity: Patient Unable To Answer (03/07/2024)  Housing: Unknown (03/07/2024)  Transportation Needs: Patient Unable To Answer (03/07/2024)  Utilities: Patient Unable To Answer (03/07/2024)  Depression (PHQ2-9): High Risk (01/26/2024)  Social Connections: Unknown (09/22/2021)   Received from [facility]  Tobacco Use: Low Risk (03/13/2024)    Readmission Risk Interventions     No data to display

## 2024-06-07 NOTE — Discharge Summary (Addendum)
 " Physician Discharge Summary   Patient: [name] [name] MRN: [id number] DOB: 1966-05-01  Admit date:     03/02/2024  Discharge date: 06/07/24  Discharge Physician: Delon Herald   PCP: [name], [name]   Recommendations at discharge:   You are being discharged to a personal care home under the guidance of [name] You are being discharged with home health physical and occupational therapy Continue psychiatric medications as ordered and follow up with psychiatry as an outpatient Continue propranolol  for tremor Follow up with NP Kayla in 1-2 weeks  Discharge Diagnoses: Principal Problem:   Acute encephalopathy Active Problems:   Type 2 diabetes mellitus with hyperglycemia, without long-term current use of insulin  (HCC)   Essential hypertension   Mixed hyperlipidemia   Tremor   Tardive dyskinesia   Acute delirium   Bipolar 1 disorder (HCC)   Goals of care, counseling/discussion   Pressure injury of skin   DNR (do not resuscitate)   Increased nutritional needs    Hospital Course: 58yo with h/o type 1 bipolar d/o, tardive dyskinesia, T2DM, frequent falls, and HLD who presented on 10/18 with AMS. He was initially boarded in the ER and was eventually admitted for evaluation of encephalopathy on 10/24. He lacked capacity early on but the state denied guardianship and he now has capacity. He is awaiting group home placement, currently scheduled for 1/29.   Assessment and Plan:  Assessment & Plan Acute encephalopathy Acute delirium Bipolar 1 disorder (HCC) Tardive dyskinesia Acute metabolic encephalopathy thought to be multifactorial given underlying cognitive impairment complicated by dehydration, and acute delirium on presentation CTA and MRI brain negative for acute findings EEG negative x 3 Unremarkable labs Delirium and fall precautions with frequent reorientation Psychiatry consulted and has signed off Continue with Depakote , duloxetine , hydroxyzine ,  propranolol , valbenazine  as currently prescribed Will need close outpatient follow-up with psychiatry As needed Haldol  Tremor This is bothersome to the patient He has been started on propranolol  and appears to be tolerating it well Pressure injury of skin Wound 05/05/24 2200 Pressure Injury Coccyx Mid;Left;Right Stage 1 -  Intact skin with non-blanchable redness of a localized area usually over a bony prominence. (Active)  Not POA Type 2 diabetes mellitus with hyperglycemia, without long-term current use of insulin  (HCC) Hemoglobin A1c 6.3%, good control Continue metformin   Mixed hyperlipidemia Continue statin  Essential hypertension Initially low BPs secondary to sedation BP normalized, currently 126/85 TSH WNL in October Propranolol  intermittently being held Increased nutritional needs Nutrition Problem: Increased nutrient needs Etiology: acute illness Signs/Symptoms: estimated needs Interventions: Ensure Enlive (each supplement provides 350kcal and 20 grams of protein), MVI DNR (do not resuscitate) Goals of care, counseling/discussion Had two-physician signed DNR early on when he lacked capacity Palliative care was also involved in the care at that time and has followed the patient throughout this hospitalization  APS was involved, but guardianship was denied  At this time, he is awaiting LTC placement He is scheduled to go to a group home 1/29 with [name]     Consultants: Psychiatry Neurology Pulmonology Palliative Care Dietician PT OT ICM team   Procedures: EEG 10/30   Antibiotics: Augmentin  11/3-8    Pain control - [place]  Controlled Substance Reporting System database was reviewed. and patient was instructed, not to drive, operate heavy machinery, perform activities at heights, swimming or participation in water activities or provide baby-sitting services while on Pain, Sleep and Anxiety Medications; until their outpatient Physician has advised  to do so again. Also recommended  to not to take more than prescribed Pain, Sleep and Anxiety Medications.   Disposition: Group home Diet recommendation:  Carb modified diet DISCHARGE MEDICATION: Allergies as of 06/07/2024   No Known Allergies      Medication List     STOP taking these medications    glimepiride  4 MG tablet Commonly known as: AMARYL    lisinopril  20 MG tablet Commonly known as: ZESTRIL    sildenafil  20 MG tablet Commonly known as: REVATIO        TAKE these medications    benztropine  0.5 MG tablet Commonly known as: COGENTIN  Take 1 tablet (0.5 mg total) by mouth daily. Start taking on: June 08, 2024 What changed:  medication strength how much to take when to take this The timing of this medication is very important.   acetaminophen  325 MG tablet Commonly known as: TYLENOL  Take 2 tablets (650 mg total) by mouth every 6 (six) hours as needed for mild pain (pain score 1-3) or fever (or Fever >/= 101).   atorvastatin  20 MG tablet Commonly known as: LIPITOR Take 1 tablet (20 mg total) by mouth daily.   divalproex  125 MG DR tablet Commonly known as: DEPAKOTE  Take 5 tablets (625 mg total) by mouth every 8 (eight) hours. What changed:  medication strength how much to take when to take this   divalproex  500 MG DR tablet Commonly known as: DEPAKOTE  Take 1 tablet (500 mg total) by mouth every 8 (eight) hours. Take WITH 125 mg every 8 hours for total of 625 mg every 8 hours What changed: You were already taking a medication with the same name, and this prescription was added. Make sure you understand how and when to take each.   DULoxetine  60 MG capsule Commonly known as: CYMBALTA  Take 1 capsule (60 mg total) by mouth 2 (two) times daily. What changed:  medication strength how much to take   haloperidol  5 MG tablet Commonly known as: HALDOL  Take 1 tablet (5 mg total) by mouth every 8 (eight) hours as needed for agitation.   hydrOXYzine  50 MG  tablet Commonly known as: ATARAX  Take 1 tablet (50 mg total) by mouth at bedtime. What changed:  how much to take how to take this when to take this additional instructions   metFORMIN  500 MG tablet Commonly known as: GLUCOPHAGE  Take 1 tablet (500 mg total) by mouth 2 (two) times daily with a meal. What changed: when to take this   propranolol  10 MG tablet Commonly known as: INDERAL  Take 0.5 tablets (5 mg total) by mouth 2 (two) times daily. What changed:  how much to take when to take this   traZODone  50 MG tablet Commonly known as: DESYREL  Take 1 tablet (50 mg total) by mouth at bedtime as needed for sleep.   valbenazine  80 MG capsule Commonly known as: INGREZZA  Take 1 capsule (80 mg total) by mouth daily.               Durable Medical Equipment  (From admission, onward)           Start     Ordered   06/05/24 1402  For home use only DME Walker rolling  Once       Question Answer Comment  Walker: With 5 Inch Wheels   Patient needs a walker to treat with the following condition General weakness      06/05/24 1403   03/07/24 1438  For home use only DME standard manual wheelchair with seat cushion  Once  Comments: Patient suffers from Parkinson's which impairs their ability to perform daily activities like ambulation in the home.  A walker or cane will not resolve issue with performing activities of daily living. A wheelchair will allow patient to safely perform daily activities. Patient can safely propel the wheelchair in the home or has a caregiver who can provide assistance. Length of need: Lifetime. Accessories: elevating leg rests (ELRs), wheel locks, extensions and anti-tippers.   03/07/24 1444            Contact information for follow-up providers     [name], [name] Follow up in 1 week(s).   Specialty: Family Medicine Contact information: [address] [place] [address] [phone]              Contact information  for after-discharge care     Durable Medical Equipment     [facility] (DME) .   Service: Durable Medical Equipment Contact information: [address] [place]   [address] [phone]                    Discharge Exam:   Subjective: Feeling fine, no new concerns. Not happy about leaving the hospital.   Objective: Vitals:   06/07/24 0559 06/07/24 1308  BP: 108/74 109/73  Pulse: (!) 53 68  Resp: 18 18  Temp: 97.7 F (36.5 C) 97.9 F (36.6 C)  SpO2: 96% 96%   No intake or output data in the 24 hours ending 06/07/24 1522 Filed Weights   03/07/24 2229 03/26/24 1715  Weight: 87.4 kg 83 kg    Exam:  General:  Appears calm and comfortable and is in NAD Eyes:  normal lids, iris ENT:  grossly normal hearing, lips & tongue, mmm Cardiovascular:  RRR. No LE edema.  Respiratory:   CTA bilaterally with no wheezes/rales/rhonchi.  Normal respiratory effort. Abdomen:  soft, NT, ND Skin:  no rash or induration seen on limited exam Musculoskeletal:  grossly normal tone BUE/BLE, good ROM, no bony abnormality Psychiatric:  blunted mood and affect, speech fluent and appropriate, AOx3 Neurologic:  CN 2-12 grossly intact, moves all extremities in coordinated fashion  Data Reviewed: I have reviewed the patient's lab results since admission.  Pertinent labs for today include:   None today    Condition at discharge: stable  The results of significant diagnostics from this hospitalization (including imaging, microbiology, ancillary and laboratory) are listed below for reference.   Imaging Studies: No results found.  Microbiology: Results for orders placed or performed during the hospital encounter of 03/02/24  MRSA Next Gen by PCR, Nasal     Status: Abnormal   Collection Time: 03/13/24  2:17 PM   Specimen: Nasal Mucosa; Nasal Swab  Result Value Ref Range Status   MRSA by PCR Next Gen DETECTED (A) NOT DETECTED Final     Comment: (NOTE) The GeneXpert MRSA Assay (FDA approved for NASAL specimens only), is one component of a comprehensive MRSA colonization surveillance program. It is not intended to diagnose MRSA infection nor to guide or monitor treatment for MRSA infections. Test performance is not FDA approved in patients less than 41 years old. Performed at [facility], [address], [place], [place] [address]   MRSA Next Gen by PCR, Nasal     Status: Abnormal   Collection Time: 03/16/24  2:20 PM   Specimen: Nasal Mucosa; Nasal Swab  Result Value Ref Range Status   MRSA by PCR Next  Gen DETECTED (A) NOT DETECTED Final    Comment: (NOTE) The GeneXpert MRSA Assay (FDA approved for NASAL specimens only), is one component of a comprehensive MRSA colonization surveillance program. It is not intended to diagnose MRSA infection nor to guide or monitor treatment for MRSA infections. Test performance is not FDA approved in patients less than 2 years old. Performed at [facility], [address] [place], [place] [address]   MRSA Next Gen by PCR, Nasal     Status: None   Collection Time: 04/26/24  9:26 PM   Specimen: Nasal Mucosa; Nasal Swab  Result Value Ref Range Status   MRSA by PCR Next Gen NOT DETECTED NOT DETECTED Final    Comment: (NOTE) The GeneXpert MRSA Assay (FDA approved for NASAL specimens only), is one component of a comprehensive MRSA colonization surveillance program. It is not intended to diagnose MRSA infection nor to guide or monitor treatment for MRSA infections. Test performance is not FDA approved in patients less than 18 years old. Performed at [facility], [address], [place], [place] [address]     Labs: CBC: No results for input(s): WBC, NEUTROABS, HGB, HCT, MCV, PLT in the last 168 hours. Basic Metabolic Panel: No results for input(s): NA, K, CL, CO2, GLUCOSE, BUN, CREATININE,  CALCIUM , MG, PHOS in the last 168 hours. Liver Function Tests: No results for input(s): AST, ALT, ALKPHOS, BILITOT, PROT, ALBUMIN  in the last 168 hours. CBG: No results for input(s): GLUCAP in the last 168 hours.  Discharge time spent: greater than 30 minutes.  Signed: Delon Herald, MD [facility] 06/07/2024 "

## 2024-06-07 NOTE — TOC Transition Note (Addendum)
 Transition of Care [facility]) - Discharge Note   Patient Details  Name: [name] [name] MRN: [id number] Date of Birth: Aug 04, 1965  Transition of Care [facility]) CM/SW Contact:  Doneta Glenys DASEN, RN Phone Number: 06/07/2024, 3:05 PM   Clinical Narrative:    4:43 PM CM was notified that patients transport had not arrived. CM called [employer] ([employer] [facility]) to . Had to reorder the ride because the prior company was calling the wrong number. New Trip ID [id number]. CM called [name] [phone] with update. 3:05 PM Patient is being discharged to [facility], [address], [place] [address] Jacalyn Seminole [phone]). [employer] has delivered the walker and manual wheelchair to patients room. [facility] POA documents have been completed and added to shadow chart. Discharge location and [facility] POA emailed to Terris Gosling (APS) social worker ([email or web address]) [phone]. CM signing off.   Final next level of care: Group Home ([facility]) Barriers to Discharge: Barriers Resolved   Patient Goals and CMS Choice Patient states their goals for this hospitalization and ongoing recovery are:: Get better and find housing CMS Medicare.gov Compare Post Acute Care list provided to:: Patient Choice offered to / list presented to : Patient [place] ownership interest in [facility].provided to:: Patient    Discharge Placement   Existing PASRR number confirmed : 06/07/24          Patient chooses bed at:  [name]) Patient to be transferred to facility by: [facility] Name of family member notified: [name] Patient and family notified of of transfer: 06/07/24  Discharge Plan and Services Additional resources added to the After Visit Summary for                  DME Arranged: Vannie, Wheelchair manual DME Agency: [facility], [employer] Date DME Agency Contacted: 06/06/24 Time DME Agency Contacted: 1500 Representative spoke  with at DME Agency: Rena HH Arranged: NA HH Agency: NA        Social Drivers of Health (SDOH) Interventions SDOH Screenings   Food Insecurity: Patient Unable To Answer (03/07/2024)  Housing: Unknown (03/07/2024)  Transportation Needs: Patient Unable To Answer (03/07/2024)  Utilities: Patient Unable To Answer (03/07/2024)  Depression (PHQ2-9): High Risk (01/26/2024)  Social Connections: Unknown (09/22/2021)   Received from [facility]  Tobacco Use: Low Risk (03/13/2024)     Readmission Risk Interventions     No data to display

## 2024-06-07 NOTE — TOC Progression Note (Addendum)
 Transition of Care [facility]) - Progression Note    Patient Details  Name: [name] [name] MRN: [id number] Date of Birth: [birth year]  Transition of Care [facility]) CM/SW Contact  Sonda Manuella Quill, RN Phone Number: 06/07/2024, 6:08 PM  Clinical Narrative:    Notified by Elonda, RN that pt's transportation has not yet arrived; LVM for Delon Lesches and Albino Jes, IP CM Supervisors and Cleatrice Sprang, IP CM Director; also spoke w/ Dewayne at [employer] [phone]); he was given pt previously given trip ID # [id number]; he said this trip was scheduled for 1109 PM; he was not able to secure ride through contracted agency; Dewayne will escalate request; he was given # for [employer] Nurse [phone]); Dewayne also said the previously listed pickup time still stands; spoke w/ Delphine, IP CM Supervisor; she requested Care Home owner be contacted to assist w/ transport; spoke w/ [name], owner of [facility] [phone]); he said agency not able to provide transportation, and they have been waiting all day; spoke w/ Patria at [employer]; requested pick up 06/08/24 at 0930 pick up at [facility]; she was given # for 5W charge nurse and oncoming CM; Patria no contract agency can accpet, so request will be sent to dispatch and will assign trip to company; dispatch will contact oncoming IP CM for follow up in am; Delphine. Supervisor notified.  Barriers to Discharge: Barriers Resolved               Expected Discharge Plan and Services         Expected Discharge Date: 06/07/24               DME Arranged: Vannie, Wheelchair manual DME Agency: [facility] Date DME Agency Contacted: 06/06/24 Time DME Agency Contacted: 1500 Representative spoke with at DME Agency: Rena HH Arranged: NA HH Agency: NA         Social Drivers of Health (SDOH) Interventions SDOH Screenings   Food Insecurity: Patient Unable To Answer (03/07/2024)  Housing: Unknown (03/07/2024)   Transportation Needs: Patient Unable To Answer (03/07/2024)  Utilities: Patient Unable To Answer (03/07/2024)  Depression (PHQ2-9): High Risk (01/26/2024)  Social Connections: Unknown (09/22/2021)   Received from [facility]  Tobacco Use: Low Risk (03/13/2024)    Readmission Risk Interventions     No data to display

## 2024-06-07 NOTE — Assessment & Plan Note (Addendum)
 Nutrition Problem: Increased nutrient needs Etiology: acute illness Signs/Symptoms: estimated needs Interventions: Ensure Enlive (each supplement provides 350kcal and 20 grams of protein), MVI

## 2024-06-07 NOTE — Plan of Care (Signed)

## 2024-06-07 NOTE — Assessment & Plan Note (Addendum)
 This is bothersome to the patient He has been started on propranolol  and appears to be tolerating it well

## 2024-06-08 NOTE — TOC Transition Note (Signed)
 Transition of Care [facility]) - Discharge Note   Patient Details  Name: [name] [name] MRN: [id number] Date of Birth: 24-May-1965  Transition of Care [facility]) CM/SW Contact:  Doneta Glenys DASEN, RN Phone Number: 06/08/2024, 9:42 AM   Clinical Narrative:    Patient was pick up. CM called [name] to inform that patient was in route to [facility].   Final next level of care: Group Home ([facility]) Barriers to Discharge: Barriers Resolved   Patient Goals and CMS Choice Patient states their goals for this hospitalization and ongoing recovery are:: Get better and find housing CMS Medicare.gov Compare Post Acute Care list provided to:: Patient Choice offered to / list presented to : Patient [place] ownership interest in [facility].provided to:: Patient    Discharge Placement   Existing PASRR number confirmed : 06/07/24          Patient chooses bed at:  [facility]) Patient to be transferred to facility by: [facility] Name of family member notified: [name] Patient and family notified of of transfer: 06/07/24  Discharge Plan and Services Additional resources added to the After Visit Summary for                  DME Arranged: Vannie, Wheelchair manual DME Agency: [facility], [employer] Date DME Agency Contacted: 06/06/24 Time DME Agency Contacted: 1500 Representative spoke with at DME Agency: Rena HH Arranged: NA HH Agency: NA        Social Drivers of Health (SDOH) Interventions SDOH Screenings   Food Insecurity: Patient Unable To Answer (03/07/2024)  Housing: Unknown (03/07/2024)  Transportation Needs: Patient Unable To Answer (03/07/2024)  Utilities: Patient Unable To Answer (03/07/2024)  Depression (PHQ2-9): High Risk (01/26/2024)  Social Connections: Unknown (09/22/2021)   Received from [facility]  Tobacco Use: Low Risk (03/13/2024)     Readmission Risk Interventions     No data to display

## 2024-06-08 NOTE — Progress Notes (Signed)
 ICM Supervisor called to verify ride this am- Ride is set up for pickup at 930am- phone number to call for driver [phone] ICM will follow

## 2024-06-08 NOTE — Plan of Care (Signed)
 " Problem: Health Behavior/Discharge Planning: Goal: Ability to manage health-related needs will improve 06/08/2024 0939 by Reesa Burnard HERO, RN Outcome: Progressing 06/08/2024 0939 by Reesa Burnard HERO, RN Outcome: Progressing   Problem: Clinical Measurements: Goal: Ability to maintain clinical measurements within normal limits will improve 06/08/2024 0939 by Reesa Burnard HERO, RN Outcome: Progressing 06/08/2024 0939 by Reesa Burnard HERO, RN Outcome: Progressing Goal: Will remain free from infection 06/08/2024 0939 by Reesa Burnard HERO, RN Outcome: Progressing 06/08/2024 0939 by Reesa Burnard HERO, RN Outcome: Progressing Goal: Diagnostic test results will improve 06/08/2024 0939 by Reesa Burnard HERO, RN Outcome: Progressing 06/08/2024 0939 by Reesa Burnard HERO, RN Outcome: Progressing Goal: Respiratory complications will improve 06/08/2024 0939 by Reesa Burnard HERO, RN Outcome: Progressing 06/08/2024 0939 by Reesa Burnard HERO, RN Outcome: Progressing Goal: Cardiovascular complication will be avoided 06/08/2024 0939 by Reesa Burnard HERO, RN Outcome: Progressing 06/08/2024 0939 by Reesa Burnard HERO, RN Outcome: Progressing   Problem: Activity: Goal: Risk for activity intolerance will decrease 06/08/2024 0939 by Reesa Burnard HERO, RN Outcome: Progressing 06/08/2024 0939 by Reesa Burnard HERO, RN Outcome: Progressing   Problem: Nutrition: Goal: Adequate nutrition will be maintained 06/08/2024 0939 by Reesa Burnard HERO, RN Outcome: Progressing 06/08/2024 0939 by Reesa Burnard HERO, RN Outcome: Progressing   Problem: Coping: Goal: Level of anxiety will decrease 06/08/2024 0939 by Reesa Burnard HERO, RN Outcome: Progressing 06/08/2024 0939 by Reesa Burnard HERO, RN Outcome: Progressing   Problem: Elimination: Goal: Will not experience complications related to bowel motility 06/08/2024 0939 by  Reesa Burnard HERO, RN Outcome: Progressing 06/08/2024 0939 by Reesa Burnard HERO, RN Outcome: Progressing Goal: Will not experience complications related to urinary retention 06/08/2024 0939 by Reesa Burnard HERO, RN Outcome: Progressing 06/08/2024 0939 by Reesa Burnard HERO, RN Outcome: Progressing   Problem: Pain Managment: Goal: General experience of comfort will improve and/or be controlled 06/08/2024 0939 by Reesa Burnard HERO, RN Outcome: Progressing 06/08/2024 0939 by Reesa Burnard HERO, RN Outcome: Progressing   Problem: Safety: Goal: Ability to remain free from injury will improve 06/08/2024 0939 by Reesa Burnard HERO, RN Outcome: Progressing 06/08/2024 0939 by Reesa Burnard HERO, RN Outcome: Progressing   Problem: Skin Integrity: Goal: Risk for impaired skin integrity will decrease 06/08/2024 0939 by Reesa Burnard HERO, RN Outcome: Progressing 06/08/2024 0939 by Reesa Burnard HERO, RN Outcome: Progressing   Problem: Education: Goal: Ability to describe self-care measures that may prevent or decrease complications (Diabetes Survival Skills Education) will improve 06/08/2024 0939 by Reesa Burnard HERO, RN Outcome: Progressing 06/08/2024 0939 by Reesa Burnard HERO, RN Outcome: Progressing Goal: Individualized Educational Video(s) 06/08/2024 0939 by Reesa Burnard HERO, RN Outcome: Progressing 06/08/2024 0939 by Reesa Burnard HERO, RN Outcome: Progressing   Problem: Coping: Goal: Ability to adjust to condition or change in health will improve 06/08/2024 0939 by Reesa Burnard HERO, RN Outcome: Progressing 06/08/2024 0939 by Reesa Burnard HERO, RN Outcome: Progressing   Problem: Fluid Volume: Goal: Ability to maintain a balanced intake and output will improve 06/08/2024 0939 by Reesa Burnard HERO, RN Outcome: Progressing 06/08/2024 0939 by Reesa Burnard HERO, RN Outcome: Progressing   Problem: Health  Behavior/Discharge Planning: Goal: Ability to identify and utilize available resources and services will improve 06/08/2024 0939 by Reesa Burnard HERO, RN Outcome: Progressing 06/08/2024 0939 by Reesa Burnard HERO, RN Outcome: Progressing Goal: Ability to manage health-related needs will improve 06/08/2024 0939 by Reesa Burnard HERO, RN Outcome: Progressing 06/08/2024 0939 by Reesa Burnard HERO, RN Outcome: Progressing   Problem: Metabolic: Goal: Ability to maintain  appropriate glucose levels will improve 06/08/2024 0939 by Reesa Burnard HERO, RN Outcome: Progressing 06/08/2024 0939 by Reesa Burnard HERO, RN Outcome: Progressing   Problem: Nutritional: Goal: Maintenance of adequate nutrition will improve 06/08/2024 0939 by Reesa Burnard HERO, RN Outcome: Progressing 06/08/2024 0939 by Reesa Burnard HERO, RN Outcome: Progressing Goal: Progress toward achieving an optimal weight will improve 06/08/2024 0939 by Reesa Burnard HERO, RN Outcome: Progressing 06/08/2024 0939 by Reesa Burnard HERO, RN Outcome: Progressing   Problem: Skin Integrity: Goal: Risk for impaired skin integrity will decrease 06/08/2024 0939 by Reesa Burnard HERO, RN Outcome: Progressing 06/08/2024 0939 by Reesa Burnard HERO, RN Outcome: Progressing   Problem: Tissue Perfusion: Goal: Adequacy of tissue perfusion will improve Outcome: Progressing   "

## 2024-06-08 NOTE — Progress Notes (Signed)
 No charge note -  Patient discharged yesterday to group home but transportation fell through and he remained hospitalized overnight without issue.  Will dc today as planned.  [facility] is planned for 0930.  Delon CHARLENA Herald, M.D.

## 2024-06-08 NOTE — Plan of Care (Signed)
 " Problem: Health Behavior/Discharge Planning: Goal: Ability to manage health-related needs will improve 06/08/2024 0939 by Reesa Burnard HERO, RN Outcome: Adequate for Discharge 06/08/2024 [phone] by Reesa Burnard HERO, RN Outcome: Progressing 06/08/2024 0939 by Reesa Burnard HERO, RN Outcome: Progressing   Problem: Clinical Measurements: Goal: Ability to maintain clinical measurements within normal limits will improve 06/08/2024 0939 by Reesa Burnard HERO, RN Outcome: Adequate for Discharge 06/08/2024 [phone] by Reesa Burnard HERO, RN Outcome: Progressing 06/08/2024 0939 by Reesa Burnard HERO, RN Outcome: Progressing Goal: Will remain free from infection 06/08/2024 0939 by Reesa Burnard HERO, RN Outcome: Adequate for Discharge 06/08/2024 [phone] by Reesa Burnard HERO, RN Outcome: Progressing 06/08/2024 0939 by Reesa Burnard HERO, RN Outcome: Progressing Goal: Diagnostic test results will improve 06/08/2024 0939 by Reesa Burnard HERO, RN Outcome: Adequate for Discharge 06/08/2024 [phone] by Reesa Burnard HERO, RN Outcome: Progressing 06/08/2024 0939 by Reesa Burnard HERO, RN Outcome: Progressing Goal: Respiratory complications will improve 06/08/2024 0939 by Reesa Burnard HERO, RN Outcome: Adequate for Discharge 06/08/2024 [phone] by Reesa Burnard HERO, RN Outcome: Progressing 06/08/2024 0939 by Reesa Burnard HERO, RN Outcome: Progressing Goal: Cardiovascular complication will be avoided 06/08/2024 0939 by Reesa Burnard HERO, RN Outcome: Adequate for Discharge 06/08/2024 [phone] by Reesa Burnard HERO, RN Outcome: Progressing 06/08/2024 0939 by Reesa Burnard HERO, RN Outcome: Progressing   Problem: Activity: Goal: Risk for activity intolerance will decrease 06/08/2024 0939 by Reesa Burnard HERO, RN Outcome: Adequate for Discharge 06/08/2024 [phone] by Reesa Burnard HERO, RN Outcome: Progressing 06/08/2024 0939 by Reesa Burnard HERO, RN Outcome: Progressing   Problem: Nutrition: Goal: Adequate nutrition will be maintained 06/08/2024 0939 by Reesa Burnard HERO, RN Outcome: Adequate for Discharge 06/08/2024 [phone] by Reesa Burnard HERO, RN Outcome: Progressing 06/08/2024 0939 by Reesa Burnard HERO, RN Outcome: Progressing   Problem: Coping: Goal: Level of anxiety will decrease 06/08/2024 0939 by Reesa Burnard HERO, RN Outcome: Adequate for Discharge 06/08/2024 [phone] by Reesa Burnard HERO, RN Outcome: Progressing 06/08/2024 0939 by Reesa Burnard HERO, RN Outcome: Progressing   Problem: Elimination: Goal: Will not experience complications related to bowel motility 06/08/2024 0939 by Reesa Burnard HERO, RN Outcome: Adequate for Discharge 06/08/2024 [phone] by Reesa Burnard HERO, RN Outcome: Progressing 06/08/2024 0939 by Reesa Burnard HERO, RN Outcome: Progressing Goal: Will not experience complications related to urinary retention 06/08/2024 0939 by Reesa Burnard HERO, RN Outcome: Adequate for Discharge 06/08/2024 [phone] by Reesa Burnard HERO, RN Outcome: Progressing 06/08/2024 0939 by Reesa Burnard HERO, RN Outcome: Progressing   Problem: Pain Managment: Goal: General experience of comfort will improve and/or be controlled 06/08/2024 0939 by Reesa Burnard HERO, RN Outcome: Adequate for Discharge 06/08/2024 [phone] by Reesa Burnard HERO, RN Outcome: Progressing 06/08/2024 0939 by Reesa Burnard HERO, RN Outcome: Progressing   Problem: Safety: Goal: Ability to remain free from injury will improve 06/08/2024 0939 by Reesa Burnard HERO, RN Outcome: Adequate for Discharge 06/08/2024 [phone] by Reesa Burnard HERO, RN Outcome: Progressing 06/08/2024 0939 by Reesa Burnard HERO, RN Outcome: Progressing   Problem: Skin Integrity: Goal: Risk for impaired skin integrity will decrease 06/08/2024 0939 by Reesa Burnard HERO, RN Outcome: Adequate for  Discharge 06/08/2024 [phone] by Reesa Burnard HERO, RN Outcome: Progressing 06/08/2024 0939 by Reesa Burnard HERO, RN Outcome: Progressing   Problem: Education: Goal: Ability to describe self-care measures that may prevent or decrease complications (Diabetes Survival Skills Education) will improve 06/08/2024 0939 by Reesa Burnard HERO, RN Outcome: Adequate for Discharge 06/08/2024 [phone] by Reesa Burnard HERO, RN Outcome: Progressing 06/08/2024 0939 by [place],  Burnard HERO, RN Outcome: Progressing Goal: Individualized Educational Video(s) 06/08/2024 [phone] by Reesa Burnard HERO, RN Outcome: Adequate for Discharge 06/08/2024 [phone] by Reesa Burnard HERO, RN Outcome: Progressing 06/08/2024 0939 by Reesa Burnard HERO, RN Outcome: Progressing   Problem: Coping: Goal: Ability to adjust to condition or change in health will improve 06/08/2024 0939 by Reesa Burnard HERO, RN Outcome: Adequate for Discharge 06/08/2024 [phone] by Reesa Burnard HERO, RN Outcome: Progressing 06/08/2024 0939 by Reesa Burnard HERO, RN Outcome: Progressing   Problem: Fluid Volume: Goal: Ability to maintain a balanced intake and output will improve 06/08/2024 0939 by Reesa Burnard HERO, RN Outcome: Adequate for Discharge 06/08/2024 [phone] by Reesa Burnard HERO, RN Outcome: Progressing 06/08/2024 0939 by Reesa Burnard HERO, RN Outcome: Progressing   Problem: Health Behavior/Discharge Planning: Goal: Ability to identify and utilize available resources and services will improve 06/08/2024 0939 by Reesa Burnard HERO, RN Outcome: Adequate for Discharge 06/08/2024 0939 by Reesa Burnard HERO, RN Outcome: Progressing 06/08/2024 0939 by Reesa Burnard HERO, RN Outcome: Progressing Goal: Ability to manage health-related needs will improve 06/08/2024 0939 by Reesa Burnard HERO, RN Outcome: Adequate for Discharge 06/08/2024 [phone] by Reesa Burnard HERO, RN Outcome:  Progressing 06/08/2024 0939 by Reesa Burnard HERO, RN Outcome: Progressing   Problem: Metabolic: Goal: Ability to maintain appropriate glucose levels will improve 06/08/2024 0939 by Reesa Burnard HERO, RN Outcome: Adequate for Discharge 06/08/2024 [phone] by Reesa Burnard HERO, RN Outcome: Progressing 06/08/2024 0939 by Reesa Burnard HERO, RN Outcome: Progressing   Problem: Nutritional: Goal: Maintenance of adequate nutrition will improve 06/08/2024 0939 by Reesa Burnard HERO, RN Outcome: Adequate for Discharge 06/08/2024 [phone] by Reesa Burnard HERO, RN Outcome: Progressing 06/08/2024 0939 by Reesa Burnard HERO, RN Outcome: Progressing Goal: Progress toward achieving an optimal weight will improve 06/08/2024 0939 by Reesa Burnard HERO, RN Outcome: Adequate for Discharge 06/08/2024 [phone] by Reesa Burnard HERO, RN Outcome: Progressing 06/08/2024 0939 by Reesa Burnard HERO, RN Outcome: Progressing   Problem: Skin Integrity: Goal: Risk for impaired skin integrity will decrease 06/08/2024 0939 by Reesa Burnard HERO, RN Outcome: Adequate for Discharge 06/08/2024 [phone] by Reesa Burnard HERO, RN Outcome: Progressing 06/08/2024 0939 by Reesa Burnard HERO, RN Outcome: Progressing   Problem: Tissue Perfusion: Goal: Adequacy of tissue perfusion will improve 06/08/2024 0939 by Reesa Burnard HERO, RN Outcome: Adequate for Discharge 06/08/2024 0939 by Reesa Burnard HERO, RN Outcome: Progressing   "
